# Patient Record
Sex: Male | Born: 1950
Health system: Southern US, Community
[De-identification: ages and names within clinical notes are randomized; demographics above are authoritative.]

## PROBLEM LIST (undated history)

## (undated) DIAGNOSIS — E46 Unspecified protein-calorie malnutrition: Secondary | ICD-10-CM

## (undated) DIAGNOSIS — Z87891 Personal history of nicotine dependence: Secondary | ICD-10-CM

## (undated) DIAGNOSIS — N182 Chronic kidney disease, stage 2 (mild): Secondary | ICD-10-CM

## (undated) DIAGNOSIS — E785 Hyperlipidemia, unspecified: Secondary | ICD-10-CM

## (undated) DIAGNOSIS — R000 Tachycardia, unspecified: Secondary | ICD-10-CM

## (undated) DIAGNOSIS — J189 Pneumonia, unspecified organism: Secondary | ICD-10-CM

## (undated) DIAGNOSIS — F32A Depression, unspecified: Secondary | ICD-10-CM

## (undated) DIAGNOSIS — I2510 Atherosclerotic heart disease of native coronary artery without angina pectoris: Secondary | ICD-10-CM

## (undated) DIAGNOSIS — I739 Peripheral vascular disease, unspecified: Secondary | ICD-10-CM

## (undated) DIAGNOSIS — E119 Type 2 diabetes mellitus without complications: Secondary | ICD-10-CM

## (undated) DIAGNOSIS — F1090 Alcohol use, unspecified, uncomplicated: Secondary | ICD-10-CM

## (undated) DIAGNOSIS — I6522 Occlusion and stenosis of left carotid artery: Secondary | ICD-10-CM

## (undated) DIAGNOSIS — I10 Essential (primary) hypertension: Secondary | ICD-10-CM

## (undated) DIAGNOSIS — E871 Hypo-osmolality and hyponatremia: Secondary | ICD-10-CM

## (undated) DIAGNOSIS — D649 Anemia, unspecified: Secondary | ICD-10-CM

## (undated) DIAGNOSIS — Z8489 Family history of other specified conditions: Secondary | ICD-10-CM

## (undated) DIAGNOSIS — R918 Other nonspecific abnormal finding of lung field: Secondary | ICD-10-CM

## (undated) DIAGNOSIS — F419 Anxiety disorder, unspecified: Secondary | ICD-10-CM

## (undated) DIAGNOSIS — G971 Other reaction to spinal and lumbar puncture: Secondary | ICD-10-CM

## (undated) DIAGNOSIS — J449 Chronic obstructive pulmonary disease, unspecified: Secondary | ICD-10-CM

## (undated) DIAGNOSIS — G4730 Sleep apnea, unspecified: Secondary | ICD-10-CM

## (undated) HISTORY — PX: OTHER SURGICAL HISTORY: SHX169

## (undated) HISTORY — PX: CAROTID STENT: SHX1301

## (undated) HISTORY — PX: LUNG BIOPSY: SHX232

---

## 1988-03-04 HISTORY — PX: OTHER SURGICAL HISTORY: SHX169

## 2004-04-09 ENCOUNTER — Ambulatory Visit: Payer: Self-pay

## 2008-06-03 ENCOUNTER — Emergency Department: Payer: Self-pay | Admitting: Emergency Medicine

## 2008-12-18 ENCOUNTER — Inpatient Hospital Stay: Payer: Self-pay | Admitting: Student

## 2009-01-13 ENCOUNTER — Ambulatory Visit: Payer: Self-pay | Admitting: Specialist

## 2009-02-28 ENCOUNTER — Ambulatory Visit: Payer: Self-pay | Admitting: Specialist

## 2009-04-11 ENCOUNTER — Ambulatory Visit: Payer: Self-pay | Admitting: Family Medicine

## 2009-05-04 ENCOUNTER — Ambulatory Visit: Payer: Self-pay | Admitting: Specialist

## 2009-11-13 ENCOUNTER — Ambulatory Visit: Payer: Self-pay | Admitting: Specialist

## 2010-10-05 DIAGNOSIS — B351 Tinea unguium: Secondary | ICD-10-CM | POA: Insufficient documentation

## 2010-10-05 DIAGNOSIS — E785 Hyperlipidemia, unspecified: Secondary | ICD-10-CM | POA: Insufficient documentation

## 2012-01-27 ENCOUNTER — Ambulatory Visit: Payer: Self-pay | Admitting: Specialist

## 2012-01-27 LAB — CREATININE, SERUM
EGFR (African American): >60
EGFR (Non-African Amer.): >60

## 2012-08-18 ENCOUNTER — Ambulatory Visit: Payer: Self-pay | Admitting: Specialist

## 2012-08-18 LAB — CREATININE, SERUM
Creatinine: 0.78 mg/dL (ref 0.60–1.30)
EGFR (African American): >60
EGFR (Non-African Amer.): >60

## 2013-03-30 ENCOUNTER — Ambulatory Visit: Payer: Self-pay | Admitting: Vascular Surgery

## 2013-04-13 ENCOUNTER — Inpatient Hospital Stay: Payer: Self-pay | Admitting: Vascular Surgery

## 2013-04-13 LAB — BASIC METABOLIC PANEL
ANION GAP: 3 — AB (ref 7–16)
BUN: 16 mg/dL (ref 7–18)
CALCIUM: 8.8 mg/dL (ref 8.5–10.1)
CHLORIDE: 101 mmol/L (ref 98–107)
CO2: 30 mmol/L (ref 21–32)
Creatinine: 0.77 mg/dL (ref 0.60–1.30)
EGFR (African American): >60
GLUCOSE: 135 mg/dL — AB (ref 65–99)
Osmolality: 271 (ref 275–301)
POTASSIUM: 4.3 mmol/L (ref 3.5–5.1)
SODIUM: 134 mmol/L — AB (ref 136–145)

## 2013-04-14 LAB — CBC WITH DIFFERENTIAL/PLATELET
BASOS ABS: 0.1 10*3/uL (ref 0.0–0.1)
Basophil %: 0.9 %
EOS ABS: 0.2 10*3/uL (ref 0.0–0.7)
EOS PCT: 2.3 %
HCT: 39.1 % — AB (ref 40.0–52.0)
HGB: 13.3 g/dL (ref 13.0–18.0)
Lymphocyte #: 2 10*3/uL (ref 1.0–3.6)
Lymphocyte %: 20 %
MCH: 29.9 pg (ref 26.0–34.0)
MCHC: 34 g/dL (ref 32.0–36.0)
MCV: 88 fL (ref 80–100)
MONO ABS: 1.3 x10 3/mm — AB (ref 0.2–1.0)
Monocyte %: 13.1 %
NEUTROS PCT: 63.7 %
Neutrophil #: 6.4 10*3/uL (ref 1.4–6.5)
Platelet: 271 10*3/uL (ref 150–440)
RBC: 4.45 10*6/uL (ref 4.40–5.90)
RDW: 14.3 % (ref 11.5–14.5)
WBC: 10.1 10*3/uL (ref 3.8–10.6)

## 2013-04-14 LAB — BASIC METABOLIC PANEL
ANION GAP: 4 — AB (ref 7–16)
BUN: 9 mg/dL (ref 7–18)
CREATININE: 0.62 mg/dL (ref 0.60–1.30)
Calcium, Total: 8.7 mg/dL (ref 8.5–10.1)
Chloride: 99 mmol/L (ref 98–107)
Co2: 28 mmol/L (ref 21–32)
EGFR (African American): >60
EGFR (Non-African Amer.): >60
Glucose: 122 mg/dL — ABNORMAL HIGH (ref 65–99)
Osmolality: 263 (ref 275–301)
POTASSIUM: 4 mmol/L (ref 3.5–5.1)
Sodium: 131 mmol/L — ABNORMAL LOW (ref 136–145)

## 2013-04-14 LAB — APTT: Activated PTT: 29.2 secs (ref 23.6–35.9)

## 2013-04-14 LAB — PROTIME-INR
INR: 1
PROTHROMBIN TIME: 13.1 s (ref 11.5–14.7)

## 2013-07-21 DIAGNOSIS — I6522 Occlusion and stenosis of left carotid artery: Secondary | ICD-10-CM | POA: Insufficient documentation

## 2013-12-07 ENCOUNTER — Ambulatory Visit: Payer: Self-pay | Admitting: Internal Medicine

## 2013-12-07 LAB — CBC CANCER CENTER
BASOS PCT: 1.1 %
Basophil #: 0.1 x10 3/mm (ref 0.0–0.1)
EOS ABS: 0.2 x10 3/mm (ref 0.0–0.7)
EOS PCT: 2.4 %
HCT: 42.1 % (ref 40.0–52.0)
HGB: 14.2 g/dL (ref 13.0–18.0)
LYMPHS PCT: 29.2 %
Lymphocyte #: 2.8 x10 3/mm (ref 1.0–3.6)
MCH: 29.7 pg (ref 26.0–34.0)
MCHC: 33.8 g/dL (ref 32.0–36.0)
MCV: 88 fL (ref 80–100)
Monocyte #: 0.9 x10 3/mm (ref 0.2–1.0)
Monocyte %: 8.9 %
Neutrophil #: 5.7 x10 3/mm (ref 1.4–6.5)
Neutrophil %: 58.4 %
PLATELETS: 340 x10 3/mm (ref 150–440)
RBC: 4.78 10*6/uL (ref 4.40–5.90)
RDW: 14 % (ref 11.5–14.5)
WBC: 9.7 x10 3/mm (ref 3.8–10.6)

## 2014-01-02 ENCOUNTER — Ambulatory Visit: Payer: Self-pay | Admitting: Internal Medicine

## 2014-03-03 DIAGNOSIS — H9311 Tinnitus, right ear: Secondary | ICD-10-CM | POA: Insufficient documentation

## 2014-04-30 ENCOUNTER — Inpatient Hospital Stay: Payer: Self-pay | Admitting: Internal Medicine

## 2014-06-25 NOTE — Op Note (Signed)
PATIENT NAME:  Bradley [name], Bradley [name] MR#:  [id number] DATE OF BIRTH:  13-Oct-1950  DATE OF PROCEDURE:  04/13/2013   PREOPERATIVE DIAGNOSIS:  Symptomatic left common carotid stenosis.   POSTOPERATIVE DIAGNOSIS:  Symptomatic left common carotid stenosis.  PROCEDURES PERFORMED:  1.  Arch aortogram.  2.  Selective injection, left common carotid for cervical views.  3.  Selective injection, left common carotid for cerebral views.  4.  Percutaneous transluminal angioplasty and stent placement, left common carotid using embolic protection.   SURGEON: Hortencia Pilar, M.D.   ASSISTANT:  Dr. Lucky Cowboy.  SEDATION: Versed 3 mg plus fentanyl 100 mcg administered IV. Continuous ECG, pulse oximetry and cardiopulmonary monitoring is performed throughout the entire procedure by the interventional radiology nurse. Total sedation time is 1 hour.   ACCESS: A 6-French sheath, right common femoral artery.   FLUOROSCOPY TIME: 5.5 minutes.   CONTRAST USED: Isovue 80 mL.   INDICATIONS: [name] is a 64 year old gentleman who is status post left carotid endarterectomy. Recently, he had several episodes of right arm dysesthesias, as well as some dizziness and syncope. Workup demonstrated a critical stenosis of the common carotid artery just proximal to the area of endarterectomy. Risks and benefits for angioplasty and stent placement were discussed. Alternative therapies were reviewed. All questions have been answered, and the patient has agreed to proceed with angioplasty and stent placement.   DESCRIPTION OF PROCEDURE: The patient is taken to special procedures and placed in the supine position. After adequate sedation is achieved, both groins are prepped and draped in a sterile fashion. Lidocaine 1% is infiltrated in the soft tissues overlying the femoral impulse. Ultrasound is placed in a sterile sleeve. Common femoral artery is identified. It is echolucent and pulsatile indicating patency. Image is recorded, and a  micropuncture needle is inserted into the anterior wall under direct ultrasound visualization; microwire followed by microsheath, J-wire followed by a 6-French sheath and a 5-French pigtail catheter. The pigtail catheter and J-wire are advanced into the ascending aorta and an LAO projection of the arch is obtained.   A stiff-angled Glidewire is used to exchange the pigtail catheter for a Bentson-Hanafee catheter, which is then used to engage the left common carotid. Wire is advanced and the catheter is subsequently  advanced to the mid common carotid.   Magnified view of the bifurcation is then obtained. The lesion is confirmed. It does appear to be fairly smooth, and I am suspicious that it represents possibly a clamp injury from the endarterectomy. He has bovine anatomy with a type III arch, and therefore, it is my opinion that the manner in which we approach this is the same as is usually seen in that the wire catheter combination will be negotiated into the external, which is done. Hand injection of contrast used to confirm catheter tip position and subsequently an Amplatz Super Stiff wire is advanced through the catheter. The catheter is removed, and a 90 cm Shuttle sheath is advanced up and over the wire and positioned with its tip just 2 cm or so below the lesion.   A large Emboshield is then advanced into the internal and deployed without difficulty. At the time of accessing the patient, 6000 units of heparin was given; ACT was 265, and another 2000 units of heparin was given thereafter.   A 10 x 30 straight Xact stent is then advanced across the lesion. It is deployed without difficulty, and then postdilated with a 7 x 30 balloon. Inflation is to 12 atmospheres very  briefly, and the balloon is then taken down. Followup imaging demonstrates the stent appears to be well approximated and quite nicely matched to the vessel size. One AP injection is obtained, and subsequently the filter was retrieved  without incident. Bilateral oblique views were then obtained of the cervical carotid and lateral and Waters views of the intracranial circulation are obtained. These are compared with the pre-stent images and are found to be identical.   The sheath is then pulled back into the external on the right. Oblique view was obtained and a StarClose device deployed without difficulty. There are no immediate complications. The patient remained neurologically intact throughout the entire procedure without any changes.   INTERPRETATION: Initial images demonstrate the patient has a type III arch with bovine anatomy. There is a moderate calcification at the common origin of the innominate and left carotid. This does not appear to be flow limiting.   The common carotid is otherwise patent and measures approximately 7 to 8 mm up to the distal aspect where a 20 to 25 mm tapered stenosis is noted. This appears smooth. It is approximately 80% to 85% diameter reduction. Just above this, is the carotid bulb which clearly demonstrates changes consistent with prior endarterectomy. Both external and internal are otherwise widely patent. The siphon of the internal carotid and the distal internal carotid artery is patent, and the middle cerebral and anterior cerebral fills quite nicely.   Following angioplasty and stent placement, using a 10 x 30 stent with a 7 mm post dilatation, there is an excellent result with minimal residual stenosis.   SUMMARY: Successful angioplasty and stent placement, left common carotid artery as described above.    ____________________________ Katha Cabal, MD ggs:dmm D: 04/13/2013 14:54:14 ET T: 04/13/2013 19:29:45 ET JOB#: [id number]  cc: Katha Cabal, MD, <Dictator> Katha Cabal MD ELECTRONICALLY SIGNED 04/15/2013 17:17

## 2014-07-03 NOTE — H&P (Signed)
PATIENT NAME:  Bradley [name], Bradley [name] MR#:  [id number] DATE OF BIRTH:  Apr 04, 1950  DATE OF ADMISSION:  04/30/2014  PRIMARY CARE PROVIDER: Valera Castle, MD   EMERGENCY DEPARTMENT REFERRING PHYSICIAN: Baird Cancer. Quale, MD  CHIEF COMPLAINT: Shortness of breath, cough, tachycardia.   HISTORY OF PRESENT ILLNESS: The patient is a 64 year old, white male with history of  COPD, history of having elevated heart rate in the past, hyperlipidemia, previous history of pneumonia, borderline diabetes according to him, borderline hyperlipidemia, has had right upper lobe partially removed for tumor, presents with shortness of breath ongoing for the past 2 days. The patient reports that he has been having shortness of breath with activity and rest for the past 2 days. He is not on O2 at home. He is on 3 liters of oxygen currently. He also has had whitish phlegm. Has not had any fevers or chills. He has been having wheezing and cold. The patient also stated that "he has been feeling jittery." His heart rate in the ER has also been noted to be elevated in the 120s sinus tachycardia. He also states that he has been feeling dizzy with walking. He otherwise denies any of abdominal pain, but does have some vomiting that he had x 2 today. No diarrhea.   PAST MEDICAL HISTORY: 1. Hypertension.  2. History of COPD.  3. Hyperlipidemia which is borderline.  4. History of pneumonia in the past.  5. History of elevated heart rate.  6. History of diabetes, which he describes it as borderline.   PAST SURGICAL HISTORY: Right upper lobe lung resection and left carotid endarterectomy.   ALLERGIES: None.   SOCIAL HISTORY: History of smoking, quit a long time ago. No alcohol or drug use.   FAMILY HISTORY: Positive for hypertension.  MEDICATIONS AT HOME: 1. Spiriva 18 mcg daily.  2. Protonix 40 daily.  3. Norco 325/7.5 every 6 p.r.n. 4. Nitroglycerin 0.4 sublingual p.r.n. 5. Metoprolol tartrate 50 mg 1 tablet p.o.  b.i.d. 6. Metformin 500 mg 1 tablet p.o. b.i.d. 7. Albuterol ipratropium 1 puff 4 times a day as needed.  8. Advair 250/50, 1 puff b.i.d.   REVIEW OF SYSTEMS:  CONSTITUTIONAL: Denies any fevers. Complains of some fatigue, weakness. No pain. No weight loss or weight gain.  EYES: No blurred or double vision. No redness or inflammation.  ENT: No tinnitus. No ear pain. No hearing loss. No seasonal or year-round allergies. No difficulty swallowing.  RESPIRATORY: Complains of cough, wheezing, COPD. No hemoptysis. No TB. Complains of dyspnea. No painful respiration.  CARDIOVASCULAR: Denies any chest pain, orthopnea, edema. Complains of sinus tachycardia and dizziness.  GASTROINTESTINAL: Complains of nausea, vomiting, but no diarrhea. No abdominal pain. No hematemesis. No hematochezia.  GENITOURINARY: Denies any frequency, urgency or hesitancy.  ENDOCRINE: Denies any polyuria or nocturia.  HEMATOLOGIC AND LYMPHATIC: Denies anemia, easy bruisability or bleeding.  SKIN: No rash.  LYMPH NODES: Denies any lymph node enlargement.  MUSCULOSKELETAL: Has chronic back pain.  NEUROLOGIC: No CVA, TIA or seizures.  PSYCHIATRIC: No anxiety, insomnia or ADD.   PHYSICAL EXAMINATION: VITAL SIGNS: Temperature 98.8, pulse 121, respirations 30, blood pressure 117/69, O2 at 97%.  GENERAL: The patient is a thin, Caucasian male in no acute distress.  HEENT: Head atraumatic, normocephalic. Pupils equally round, reactive to light and accommodation. There is no conjunctival pallor. No sclerae icterus. Nasal exam shows no drainage or ulceration. Oropharynx is clear without any exudate.  NECK: Supple without any thyromegaly.  CARDIOVASCULAR: Tachycardic. No murmurs, rubs,  clicks or gallops.  LUNGS: He has some muscle usage with bilateral wheezing.  ABDOMEN: Soft, nontender, nondistended. Positive bowel sounds x 4.  EXTREMITIES: No clubbing, cyanosis or edema.  SKIN: No rash.  LYMPH NODES: Nonpalpable.  MUSCULOSKELETAL:  There is no erythema or swelling.  VASCULAR: Good DP, PT pulses.  PSYCHIATRIC: Not anxious or depressed. NEUROLOGIC:  Awake, alert, oriented x 3. No focal deficits.   DIAGNOSTIC DATA: Chest x-ray shows chronic changes of the lungs.  LABORATORY DATA: Admitting WBC count 15.2, hemoglobin 13.6, platelet count 290,000. Troponin less than 0.02. WBC 139, BUN 15, creatinine 0.73, sodium 138, potassium 3.9, chloride 104, CO2 of 25. BNP is 124.   ASSESSMENT AND PLAN: The patient is a 64 year old with history of chronic obstructive pulmonary disease with shortness of breath and cough.  1. Acute respiratory failure due to acute on chronic obstructive pulmonary disease flare. At this time, we will treat him with Xopenex nebulizers due to elevated heart rate, IV Solu-Medrol, IV antibiotics for presumably acute bronchitis. We will also add Mucinex to his current regimen. We will continue Advair and Spiriva as taking at home.  2. Sinus tachycardia. The patient has been symptomatic with this. We will monitor him on telemetry. Use p.r.n., IV Cardizem. He is already on metoprolol, which I will continue.  3. Diabetes. We will place him on sliding scale insulin. Continue metformin.  4. Gastroesophageal reflux disease. Continue Protonix as taking at home.  5. Miscellaneous. The patient will be on Lovenox for deep vein thrombosis prophylaxis.   TIME SPENT: 55 minutes.    ____________________________ Lafonda Mosses Posey Pronto, MD shp:TT D: 04/30/2014 12:44:38 ET T: 04/30/2014 12:57:04 ET JOB#: [id number]  cc: Marisue Canion H. Posey Pronto, MD, <Dictator> Alric Seton MD ELECTRONICALLY SIGNED 05/06/2014 15:22

## 2014-07-03 NOTE — Consult Note (Signed)
Brief Consult Note: Diagnosis: E coli bacteremia, recurrent abd pain, leukocytosis.   Patient was seen by consultant.   Consult note dictated.   Recommend to proceed with surgery or procedure.   Recommend further assessment or treatment.   Orders entered.   Discussed with Attending MD.   Comments: Check CT abd pelvis Check LFTs - I added on to labs Change abx to cipro flagyl for now WIll follow.  Electronic Signatures: Angelena Form (MD)  (Signed 01-Mar-16 16:35)  Authored: Brief Consult Note   Last Updated: 01-Mar-16 16:35 by Angelena Form (MD)

## 2014-07-03 NOTE — Discharge Summary (Signed)
PATIENT NAME:  Bradley [name], Bradley [name] MR#:  [id number] DATE OF BIRTH:  11/08/1950  DATE OF ADMISSION:  04/30/2014 DATE OF DISCHARGE:  05/05/2014  PRIMARY CARE PHYSICIAN: Valera Castle, MD   DISCHARGE DIAGNOSES:  1.  Acute respiratory failure with hypoxia due to chronic obstructive pulmonary disease exacerbation. 2.  Sepsis with Escherichia coli bacteremia.  3.  Hypertension.  4.  Diabetes.   CONDITION: Stable.   CODE STATUS: Full code.   HOME MEDICATIONS: Please refer to the medication reconciliation list.   DIET: Low-sodium, low-fat, low-cholesterol, ADA diet.   ACTIVITY: As tolerated.    FOLLOWUP: With PCP within 1 to 2 weeks.   REASON FOR ADMISSION: Shortness of breath, cough, and tachycardia.   HOSPITAL COURSE: The patient is a 64 year old Caucasian male with a history of COPD, hypertension, diabetes, came to the ED due to shortness of breath, cough, and tachycardia for 2 days. For detailed history and physical examination please refer to the admission note dictated by Dr. Dustin Flock. Laboratory data on admission date showed WBC 16.2, hemoglobin 13.6. Troponin was less than 0.02. Electrolytes were normal.  1.  Acute respiratory failure due to COPD exacerbation. After admission, the patient has been treated with oxygen by nasal cannula, Xopenex IV, Solu-Medrol with antibiotics. The patient's symptoms have much improved off oxygen. 2.  Sepsis with bacteremia Escherichia coli. The patient's blood culture showed Escherichia coli positive. Dr. Ola Spurr suggests unclear etiology and either Keflex for a total of 10 days from blood culture date. The patient has no symptoms. Vital signs are stable. He is clinically stable. We will be discharged to home today. Discussed the patient's discharge plan with the patient, nurse, and the case manager.  TIME SPENT: About 36 minutes.   ____________________________ Demetrios Loll, MD qc:TM D: 05/05/2014 17:18:03 ET T: 05/06/2014 01:20:30  ET JOB#: [id number]  cc: Demetrios Loll, MD, <Dictator> Demetrios Loll MD ELECTRONICALLY SIGNED 05/06/2014 17:13

## 2014-07-03 NOTE — Consult Note (Signed)
PATIENT NAME:  Bradley [name], Bradley [name] MR#:  [id number] DATE OF BIRTH:  May 17, 1950  DATE OF CONSULTATION:  05/03/2014  REQUESTING PHYSICIAN:  Belia Heman. Verdell Carmine, MD CONSULTING PHYSICIAN:  Cheral Marker. Ola Spurr, MD  REASON FOR CONSULTATION:  Gram-negative Escherichia coli bacteremia without a source.   HISTORY OF PRESENT ILLNESS: This is a very pleasant 64 year old gentleman with history of severe COPD. He was admitted February 27 with shortness of breath, cough, and tachycardia. The patient reports that he had eaten some barbecue Friday night. He then developed some abdominal upset and nausea, and vomited a few times. He did not have any diarrhea. He then started to have some increasing difficulty breathing and shortness of breath. He also reported some cough with phlegm and some wheezing. He was seen in the ED and noted to have tachycardia. Since admission, he has been treated for COPD exacerbation. However, blood cultures returned positive 2/2 for Escherichia coli. We are consulted for further antibiotic management and possible evaluation of source of Escherichia coli bacteremia.   PAST MEDICAL HISTORY: 1.  Hypertension.  2.  COPD.  3.  Hyperlipidemia.  4.  History of prior pneumonia.  5.  History of tachycardia.  6.  History of diabetes, borderline.   PAST SURGICAL HISTORY: Right upper lobe lung resection and left carotid endarterectomy.   SOCIAL HISTORY: Quit smoking a long time ago. No alcohol or drug use.   ALLERGIES: None.   FAMILY HISTORY: Noncontributory.   REVIEW OF SYSTEMS: Eleven systems were reviewed and negative except as per HPI.   ANTIBIOTICS SINCE ADMISSION: Include levofloxacin begun February 26 and Zosyn begun February 27. He is also on methylprednisolone.   PHYSICAL EXAMINATION: VITAL SIGNS: Temperature 99.7, pulse 89, blood pressure 149/78, respirations 18, saturation 92% on room air. He has been afebrile since admission.  HEENT: Pupils reactive. Sclerae anicteric. Oropharynx is  clear with no thrush. NECK: Supple.  HEART: Regular.  LUNGS: Have decreased breath sounds bilaterally with poor air movement, but no rhonchi or crackles.  ABDOMEN: Soft, mildly tender to palpation in right upper quadrant and in the epigastrium.  EXTREMITIES: No clubbing, cyanosis, or edema.  SKIN: No rash or lesions.  NEUROLOGIC: He is alert and oriented x 3. Grossly nonfocal neurologic exam.   DATA: White blood count on February 27 was 15.2, elevated to 25.7 on February 28 and down to 17.7 on March 1. Hemoglobin 13.1, platelets 302,000. Blood cultures 2/2 growing gram-negative rods with 1 culture identified as Escherichia coli, sensitive to cefazolin, ceftriaxone, Cipro, gentamicin, ceftazidime, imipenem, and levofloxacin. Urinalysis done February 29 showed no white cells. Influenza A and B testing was negative. Renal function is normal. Creatinine 0.67. Hemoglobin A1c 6.8. LFTs added on today are normal except albumin low at 2.9.  IMAGING: Chest x-ray, February 27, showed chronic changes without any acute abnormalities. CT of the abdomen and pelvis done today with IV contrast showed old granulomatous disease in the right hepatic dome as well as in the spleen. There is severe emphysema at the bases of the lungs. Gallbladder, renal system, and colon are normal except for moderate stool burden. There are renal cysts.   IMPRESSION: A 64 year old gentleman with history of chronic obstructive pulmonary disease, admitted with 1 episode of abdominal upset in the epigastric area, followed by vomiting, which seemed to precipitate a chronic obstructive pulmonary disease exacerbation. On admission, he had an elevated white count and was tachycardic. Blood cultures 2/2 are growing Escherichia coli. Chest x-ray does not show any obvious infiltrate. CT  scan of the abdomen and pelvis done today does not show any source in his biliary or colonic system. He reports having a colonoscopy approximately 2 years ago, which  he was told was normal. He reports also having an EGD done in the past. He denies having issues with weight loss over the last several months or other chronic decline that would suggest a malignancy.   RECOMMENDATIONS: 1.  Without an obvious source for the Escherichia coli bacteremia, I would suggest a 14 day total course of antibiotics.  At this point we can narrow it to Keflex 500 mg q. 8 hours for a total of 10 days from the date of the blood culture.  He may warrant a colonoscopy and EGD in the future, although he has had a colonoscopy relatively recently and the CT scan does not show any colonic issues except moderate stool burden.  2.  Might suggest laxative to help resolve his constipation noted on CT.   Thank you for the consult. I will be glad to see the patient in followup in 2-3 weeks if needed.     ____________________________ Cheral Marker. Ola Spurr, MD dpf:LT D: 05/03/2014 20:56:00 ET T: 05/03/2014 21:35:03 ET JOB#: [id number]  cc: Cheral Marker. Ola Spurr, MD, <Dictator> Lynisha Osuch Ola Spurr MD ELECTRONICALLY SIGNED 05/05/2014 20:01

## 2014-07-04 ENCOUNTER — Ambulatory Visit: Payer: Self-pay | Admitting: Internal Medicine

## 2014-07-14 ENCOUNTER — Emergency Department
Admission: EM | Admit: 2014-07-14 | Discharge: 2014-07-14 | Disposition: A | Discharge status: Home / Self Care | Payer: No Typology Code available for payment source | Attending: Emergency Medicine | Admitting: Emergency Medicine

## 2014-07-14 ENCOUNTER — Emergency Department: Payer: No Typology Code available for payment source

## 2014-07-14 ENCOUNTER — Encounter: Payer: Self-pay | Admitting: Emergency Medicine

## 2014-07-14 ENCOUNTER — Ambulatory Visit (INDEPENDENT_AMBULATORY_CARE_PROVIDER_SITE_OTHER)
Admission: EM | Admit: 2014-07-14 | Discharge: 2014-07-14 | Disposition: A | Discharge status: Other Acute Inpatient Hospital | Payer: No Typology Code available for payment source | Source: Home / Self Care | Attending: Family Medicine | Admitting: Family Medicine

## 2014-07-14 ENCOUNTER — Other Ambulatory Visit: Payer: Self-pay

## 2014-07-14 DIAGNOSIS — R0602 Shortness of breath: Secondary | ICD-10-CM

## 2014-07-14 DIAGNOSIS — R531 Weakness: Secondary | ICD-10-CM | POA: Diagnosis not present

## 2014-07-14 DIAGNOSIS — J441 Chronic obstructive pulmonary disease with (acute) exacerbation: Secondary | ICD-10-CM | POA: Diagnosis not present

## 2014-07-14 DIAGNOSIS — J449 Chronic obstructive pulmonary disease, unspecified: Secondary | ICD-10-CM

## 2014-07-14 DIAGNOSIS — I10 Essential (primary) hypertension: Secondary | ICD-10-CM | POA: Insufficient documentation

## 2014-07-14 DIAGNOSIS — I2510 Atherosclerotic heart disease of native coronary artery without angina pectoris: Secondary | ICD-10-CM | POA: Insufficient documentation

## 2014-07-14 DIAGNOSIS — Z87891 Personal history of nicotine dependence: Secondary | ICD-10-CM | POA: Insufficient documentation

## 2014-07-14 DIAGNOSIS — E119 Type 2 diabetes mellitus without complications: Secondary | ICD-10-CM | POA: Diagnosis not present

## 2014-07-14 DIAGNOSIS — Z7951 Long term (current) use of inhaled steroids: Secondary | ICD-10-CM | POA: Diagnosis not present

## 2014-07-14 DIAGNOSIS — R9431 Abnormal electrocardiogram [ECG] [EKG]: Secondary | ICD-10-CM

## 2014-07-14 DIAGNOSIS — Z79899 Other long term (current) drug therapy: Secondary | ICD-10-CM | POA: Insufficient documentation

## 2014-07-14 DIAGNOSIS — R0600 Dyspnea, unspecified: Secondary | ICD-10-CM | POA: Diagnosis present

## 2014-07-14 HISTORY — DX: Atherosclerotic heart disease of native coronary artery without angina pectoris: I25.10

## 2014-07-14 HISTORY — DX: Chronic obstructive pulmonary disease, unspecified: J44.9

## 2014-07-14 HISTORY — DX: Type 2 diabetes mellitus without complications: E11.9

## 2014-07-14 HISTORY — DX: Essential (primary) hypertension: I10

## 2014-07-14 LAB — CBC WITH DIFFERENTIAL/PLATELET
Basophils Absolute: 0.1 10*3/uL (ref 0–0.1)
Basophils Relative: 1 %
EOS ABS: 0.1 10*3/uL (ref 0–0.7)
EOS PCT: 1 %
HEMATOCRIT: 41.3 % (ref 40.0–52.0)
HEMOGLOBIN: 13.8 g/dL (ref 13.0–18.0)
Lymphocytes Relative: 18 %
Lymphs Abs: 2.1 10*3/uL (ref 1.0–3.6)
MCH: 28.2 pg (ref 26.0–34.0)
MCHC: 33.5 g/dL (ref 32.0–36.0)
MCV: 84.3 fL (ref 80.0–100.0)
MONO ABS: 1.1 10*3/uL — AB (ref 0.2–1.0)
Monocytes Relative: 10 %
Neutro Abs: 8.1 10*3/uL — ABNORMAL HIGH (ref 1.4–6.5)
Neutrophils Relative %: 70 %
Platelets: 316 10*3/uL (ref 150–440)
RBC: 4.9 MIL/uL (ref 4.40–5.90)
RDW: 14.6 % — ABNORMAL HIGH (ref 11.5–14.5)
WBC: 11.6 10*3/uL — AB (ref 3.8–10.6)

## 2014-07-14 LAB — COMPREHENSIVE METABOLIC PANEL
ALT: 19 U/L (ref 17–63)
ANION GAP: 8 (ref 5–15)
AST: 18 U/L (ref 15–41)
Albumin: 3.7 g/dL (ref 3.5–5.0)
Alkaline Phosphatase: 66 U/L (ref 38–126)
BUN: 13 mg/dL (ref 6–20)
CHLORIDE: 99 mmol/L — AB (ref 101–111)
CO2: 24 mmol/L (ref 22–32)
Calcium: 9.1 mg/dL (ref 8.9–10.3)
Creatinine, Ser: 0.65 mg/dL (ref 0.61–1.24)
GFR calc non Af Amer: >60 mL/min (ref >60)
GLUCOSE: 130 mg/dL — AB (ref 65–99)
Potassium: 4.1 mmol/L (ref 3.5–5.1)
Sodium: 131 mmol/L — ABNORMAL LOW (ref 135–145)
Total Bilirubin: 0.8 mg/dL (ref 0.3–1.2)
Total Protein: 6.9 g/dL (ref 6.5–8.1)

## 2014-07-14 LAB — FIBRIN DERIVATIVES D-DIMER (ARMC ONLY): Fibrin derivatives D-dimer (ARMC): 421 (ref 0–499)

## 2014-07-14 LAB — TROPONIN I: Troponin I: <0.03 ng/mL (ref <0.031)

## 2014-07-14 MED ORDER — PREDNISONE 20 MG PO TABS
60.0000 mg | ORAL_TABLET | ORAL | Status: AC
  Administered 2014-07-14: 60 mg via ORAL

## 2014-07-14 MED ORDER — PREDNISONE 20 MG PO TABS: 60.0000 mg | ORAL_TABLET | Freq: Every day | ORAL | Status: DC

## 2014-07-14 MED ORDER — PREDNISONE 20 MG PO TABS
ORAL_TABLET | ORAL | Status: AC
  Administered 2014-07-14: 60 mg via ORAL
  Filled 2014-07-14: qty 3

## 2014-07-14 MED ORDER — IPRATROPIUM-ALBUTEROL 0.5-2.5 (3) MG/3ML IN SOLN
3.0000 mL | Freq: Once | RESPIRATORY_TRACT | Status: AC
  Administered 2014-07-14: 3 mL via RESPIRATORY_TRACT
  Filled 2014-07-14: qty 3

## 2014-07-14 MED ORDER — IPRATROPIUM-ALBUTEROL 0.5-2.5 (3) MG/3ML IN SOLN
3.0000 mL | Freq: Once | RESPIRATORY_TRACT | Status: AC
  Administered 2014-07-14: 3 mL via RESPIRATORY_TRACT

## 2014-07-14 MED ORDER — SODIUM CHLORIDE 0.9 % IV SOLN: Freq: Once | INTRAVENOUS | Status: DC

## 2014-07-14 MED ORDER — IPRATROPIUM-ALBUTEROL 0.5-2.5 (3) MG/3ML IN SOLN
RESPIRATORY_TRACT | Status: AC
  Administered 2014-07-14: 3 mL via RESPIRATORY_TRACT
  Filled 2014-07-14: qty 3

## 2014-07-14 NOTE — ED Notes (Signed)
Patient transported to X-ray 

## 2014-07-14 NOTE — ED Notes (Signed)
Sent in from [facility] with SOB and weakness

## 2014-07-14 NOTE — ED Notes (Signed)
Started 2 days ago with SOB, weakness and 'tired". Worse this morning. Hx of High WBC-to be seen in 2 weeks with Oncologist (Dr. Cynda Acres)

## 2014-07-14 NOTE — Discharge Instructions (Signed)
We believe that your symptoms are caused today by an exacerbation of your COPD.  Please take the prescribed medications and any medications that you have at home for your COPD.  Follow up with your doctor as recommended.  If you develop any new or worsening symptoms, including but not limited to fever, chest pain, persistent vomiting, worsening shortness of breath, or other symptoms that concern you, please return to the Emergency Department immediately.

## 2014-07-14 NOTE — ED Provider Notes (Signed)
CSN: [id number]     Arrival date & time 07/14/14  0802 History   First MD Initiated Contact with Patient 07/14/14 0805     Chief Complaint  Patient presents with  . Shortness of Breath  . Weakness  . Anxiety   (Consider location/radiation/quality/duration/timing/severity/associated sxs/prior Treatment) HPI       64 year old male with history of hypertension, diabetes, COPD, recent history of admission for Escherichia coli bacteremia 3 months ago, presents complaining of weakness and shortness of breath. This started 2 days ago. Symptoms have been gradually worsening. Symptoms are worse with exertion. He denies fever, chills, NVD. No recent travel. He is also currently in the process of being evaluated for persistent leukocytosis, he is seeing hematology oncology for this. He does not know if he has any hematologic malignancy. He does not know how high his white blood cell count has been. He has no history of coronary artery disease. Is not smoking currently, he quit recently. He has had some leg swelling, worse on the right than the left. It has gotten better in the past week however.   Past Medical History  Diagnosis Date  . COPD (chronic obstructive pulmonary disease)   . Diabetes mellitus without complication   . Coronary artery disease   . Hypertension    Past Surgical History  Procedure Laterality Date  . Lung biopsy Right     2006   Family History  Problem Relation Age of Onset  . Cancer Mother   . Heart attack Father    History  Substance Use Topics  . Smoking status: Former Research scientist (life sciences)  . Smokeless tobacco: Not on file  . Alcohol Use: No    Review of Systems  Constitutional: Positive for fatigue. Negative for fever and chills.  Eyes: Negative for visual disturbance.  Respiratory: Positive for shortness of breath. Negative for wheezing.   Cardiovascular: Negative for chest pain.  Gastrointestinal: Negative for nausea, vomiting, abdominal pain and diarrhea.  Endocrine:  Negative for polydipsia and polyuria.  Skin: Negative for rash.  Neurological: Positive for weakness.  All other systems reviewed and are negative.   Allergies  Review of patient's allergies indicates no known allergies.  Home Medications   Prior to Admission medications   Medication Sig Start Date End Date Taking? Authorizing Provider  albuterol-ipratropium (COMBIVENT) 18-103 MCG/ACT inhaler Inhale into the lungs every 4 (four) hours.   Yes Historical Provider, MD  Fluticasone-Salmeterol (ADVAIR) 250-50 MCG/DOSE AEPB Inhale 1 puff into the lungs 2 (two) times daily.   Yes Historical Provider, MD  metFORMIN (GLUCOPHAGE) 500 MG tablet Take by mouth 2 (two) times daily with a meal.   Yes Historical Provider, MD  metoprolol (LOPRESSOR) 50 MG tablet Take 50 mg by mouth 2 (two) times daily.   Yes Historical Provider, MD  pantoprazole (PROTONIX) 40 MG tablet Take 40 mg by mouth daily.   Yes Historical Provider, MD  simvastatin (ZOCOR) 20 MG tablet Take 20 mg by mouth daily.   Yes Historical Provider, MD  tiotropium (SPIRIVA) 18 MCG inhalation capsule Place 18 mcg into inhaler and inhale daily.   Yes Historical Provider, MD  nitroGLYCERIN (NITROSTAT) 0.4 MG SL tablet Place 0.4 mg under the tongue every 5 (five) minutes as needed for chest pain.    Historical Provider, MD   BP 142/78 mmHg  Pulse 80  Temp(Src) 95.8 F (35.4 C) (Tympanic)  Resp 18  Ht '5\' 7"'$  (1.702 m)  Wt 150 lb (68.04 kg)  BMI 23.49 kg/m2  SpO2 95%  Physical Exam  Constitutional: He is oriented to person, place, and time. He appears well-developed and well-nourished. No distress.  Thin habitus  HENT:  Head: Normocephalic.  Neck: Normal range of motion. Neck supple. No JVD present. No tracheal deviation present.  Cardiovascular: Normal rate, regular rhythm, normal heart sounds and intact distal pulses.   No peripheral edema  Pulmonary/Chest: Effort normal. No respiratory distress. He has wheezes (scattered, faint,  expiratory). He has no rhonchi. He has no rales.  Abdominal: Soft. He exhibits no distension.  Neurological: He is alert and oriented to person, place, and time. Coordination normal.  Skin: Skin is warm and dry. No rash noted. He is not diaphoretic.  Psychiatric: He has a normal mood and affect. Judgment normal.  Nursing note and vitals reviewed.   ED Course  ED EKG  Date/Time: 07/14/2014 9:10 AM Performed by: Liam Graham Authorized by: Allena Katz H Comparison: compared with previous ECG  Rhythm: sinus rhythm Rate: normal QRS axis: normal Conduction: right bundle branch block ST Elevation: V4 and V5 T Waves: T waves normal Clinical impression: abnormal ECG Comments: EKG independently reviewed by me as above, he has ST elevations in V4 and V5. These are new as compared to previous EKG.   (including critical care time) Labs Review Labs Reviewed  CBC WITH DIFFERENTIAL/PLATELET - Abnormal; Notable for the following:    WBC 11.6 (*)    RDW 14.6 (*)    Neutro Abs 8.1 (*)    Monocytes Absolute 1.1 (*)    All other components within normal limits  COMPREHENSIVE METABOLIC PANEL - Abnormal; Notable for the following:    Sodium 131 (*)    Chloride 99 (*)    Glucose, Bld 130 (*)    All other components within normal limits  TROPONIN I  FIBRIN DERIVATIVES D-DIMER [facility])    Imaging Review No results found.   MDM   1. Weakness   2. SOB (shortness of breath)   3. Abnormal EKG      on further review of the EKG, he has 1 mm ST elevations in V4 and V5. He'll be transferred to the emergency department for further evaluation       Liam Graham, PA-C 07/14/14 1013

## 2014-07-14 NOTE — ED Provider Notes (Signed)
[facility] Emergency Department Provider Note  ____________________________________________  Time seen: Approximately 2:27 PM  I have reviewed the triage vital signs and the nursing notes.   HISTORY  Chief Complaint Respiratory Distress    HPI Bradley [name] is a 64 y.o. male who presents from the urgent care after going there this morning to be evaluated for about 2 days of increased respiratory difficulties.  He has a history of mild COPD and does not use oxygen at home.  He stopped smoking about 15 years ago.  He does, however, see Dr. Raul Del with pulmonology.  He reports increased generalized weakness and shortness of breath particularly with exertion over the last 2 days.  He denies chest pain but states that when it is hard to breathe he also feels chest pressure.  Resting makes it better and exertion makes it worse.  The symptoms are moderate at most and mild currently.  He was sent over from the urgent care due to what they interpreted as 1 mm of ST elevation in leads V4 and V5 which was new from a prior EKG.  He denies ever having a heart attack or other cardiac issues.  The patient is in no acute distress and comfortable while laying in bed in the exam room in the ED.  He denies nausea, vomiting, urinary symptoms, abdominal pain, and diarrhea/constipation.   Past Medical History  Diagnosis Date  . COPD (chronic obstructive pulmonary disease)   . Diabetes mellitus without complication   . Coronary artery disease   . Hypertension     There are no active problems to display for this patient.   Past Surgical History  Procedure Laterality Date  . Lung biopsy Right     2006    Current Outpatient Rx  Name  Route  Sig  Dispense  Refill  . albuterol-ipratropium (COMBIVENT) 18-103 MCG/ACT inhaler   Inhalation   Inhale into the lungs every 4 (four) hours.         . Fluticasone-Salmeterol (ADVAIR) 250-50 MCG/DOSE AEPB   Inhalation   Inhale 1 puff  into the lungs 2 (two) times daily.         . metFORMIN (GLUCOPHAGE) 500 MG tablet   Oral   Take by mouth 2 (two) times daily with a meal.         . metoprolol (LOPRESSOR) 50 MG tablet   Oral   Take 50 mg by mouth 2 (two) times daily.         . nitroGLYCERIN (NITROSTAT) 0.4 MG SL tablet   Sublingual   Place 0.4 mg under the tongue every 5 (five) minutes as needed for chest pain.         . pantoprazole (PROTONIX) 40 MG tablet   Oral   Take 40 mg by mouth daily.         . predniSONE (DELTASONE) 20 MG tablet   Oral   Take 3 tablets (60 mg total) by mouth daily with breakfast.   15 tablet   0   . simvastatin (ZOCOR) 20 MG tablet   Oral   Take 20 mg by mouth daily.         Marland Kitchen tiotropium (SPIRIVA) 18 MCG inhalation capsule   Inhalation   Place 18 mcg into inhaler and inhale daily.           Allergies Review of patient's allergies indicates no known allergies.  Family History  Problem Relation Age of Onset  . Cancer Mother   .  Heart attack Father     Social History History  Substance Use Topics  . Smoking status: Former Research scientist (life sciences)  . Smokeless tobacco: Not on file  . Alcohol Use: No    Review of Systems Constitutional: No fever/chills Eyes: No visual changes. ENT: No sore throat. Cardiovascular: Denies chest pain but endorses chest "pressure" when he is short of breath Respiratory: Mild to moderate shortness of breath worse with exertion for about 2 days Gastrointestinal: No abdominal pain.  No nausea, no vomiting.  No diarrhea.  No constipation. Genitourinary: Negative for dysuria. Musculoskeletal: Negative for back pain. Skin: Negative for rash. Neurological: Negative for headaches, focal weakness or numbness.  10-point ROS otherwise negative.  ____________________________________________   PHYSICAL EXAM:  VITAL SIGNS: ED Triage Vitals  Enc Vitals Group     BP 07/14/14 1048 121/72 mmHg     Pulse Rate 07/14/14 1048 77     Resp 07/14/14  1048 22     Temp 07/14/14 1048 98.1 F (36.7 C)     Temp Source 07/14/14 1048 Oral     SpO2 07/14/14 1047 97 %     Weight 07/14/14 1048 150 lb (68.04 kg)     Height 07/14/14 1048 5' 7"  (1.702 m)     Head Cir --      Peak Flow --      Pain Score 07/14/14 1344 0     Pain Loc --      Pain Edu? --      Excl. in [place]? --     Constitutional: Alert and oriented. Well appearing and in no acute distress. Eyes: Conjunctivae are normal. PERRL. EOMI. Head: Atraumatic. Nose: No congestion/rhinnorhea. Mouth/Throat: Mucous membranes are moist.  Oropharynx non-erythematous. Neck: No stridor.   Cardiovascular: Normal rate, regular rhythm. Grossly normal heart sounds.  Good peripheral circulation. Respiratory: Normal respiratory effort.  No retractions.  Mild wheezing throughout lung fields, more noticeable in left upper lobe. Gastrointestinal: Soft and nontender. No distention. No abdominal bruits. No CVA tenderness. Musculoskeletal: No lower extremity tenderness nor edema.  No joint effusions. Neurologic:  Normal speech and language. No gross focal neurologic deficits are appreciated. Speech is normal. No gait instability. Skin:  Skin is warm, dry and intact. No rash noted. Psychiatric: Mood and affect are normal. Speech and behavior are normal.  ____________________________________________   LABS (all labs ordered are listed, but only abnormal results are displayed)  Recent Results (from the past 2160 hour(s))  CBC with Differential     Status: Abnormal   Collection Time: 07/14/14  9:03 AM  Result Value Ref Range   WBC 11.6 (H) 3.8 - 10.6 K/uL   RBC 4.90 4.40 - 5.90 MIL/uL   Hemoglobin 13.8 13.0 - 18.0 g/dL   HCT 41.3 40.0 - 52.0 %   MCV 84.3 80.0 - 100.0 fL   MCH 28.2 26.0 - 34.0 pg   MCHC 33.5 32.0 - 36.0 g/dL   RDW 14.6 (H) 11.5 - 14.5 %   Platelets 316 150 - 440 K/uL   Neutrophils Relative % 70 %   Neutro Abs 8.1 (H) 1.4 - 6.5 K/uL   Lymphocytes Relative 18 %   Lymphs Abs 2.1 1.0  - 3.6 K/uL   Monocytes Relative 10 %   Monocytes Absolute 1.1 (H) 0.2 - 1.0 K/uL   Eosinophils Relative 1 %   Eosinophils Absolute 0.1 0 - 0.7 K/uL   Basophils Relative 1 %   Basophils Absolute 0.1 0 - 0.1 K/uL  Comprehensive metabolic panel  Status: Abnormal   Collection Time: 07/14/14  9:03 AM  Result Value Ref Range   Sodium 131 (L) 135 - 145 mmol/L   Potassium 4.1 3.5 - 5.1 mmol/L   Chloride 99 (L) 101 - 111 mmol/L   CO2 24 22 - 32 mmol/L   Glucose, Bld 130 (H) 65 - 99 mg/dL   BUN 13 6 - 20 mg/dL   Creatinine, Ser 0.65 0.61 - 1.24 mg/dL   Calcium 9.1 8.9 - 10.3 mg/dL   Total Protein 6.9 6.5 - 8.1 g/dL   Albumin 3.7 3.5 - 5.0 g/dL   AST 18 15 - 41 U/L   ALT 19 17 - 63 U/L   Alkaline Phosphatase 66 38 - 126 U/L   Total Bilirubin 0.8 0.3 - 1.2 mg/dL   GFR calc non Af Amer >60 >60 mL/min   GFR calc Af Amer >60 >60 mL/min    Comment: (NOTE) The eGFR has been calculated using the CKD EPI equation. This calculation has not been validated in all clinical situations. eGFR's persistently <60 mL/min signify possible Chronic Kidney Disease.    Anion gap 8 5 - 15  Fibrin derivatives D-Dimer     Status: None   Collection Time: 07/14/14  9:03 AM  Result Value Ref Range   Fibrin derivatives D-dimer ([facility]) 421 0 - 499  Troponin I     Status: None   Collection Time: 07/14/14  2:31 PM  Result Value Ref Range   Troponin I <0.03 <0.031 ng/mL    Comment:        NO INDICATION OF MYOCARDIAL INJURY.     ____________________________________________  EKG  ED ECG REPORT   Date: 07/14/2014  EKG Time: 14:18  Rate: 74  Rhythm: normal sinus rhythm, RBBB  Axis: Normal  Intervals:right bundle branch block  ST&T Change: Nonspecific ST changes in lateral leads but no ST elevation or depression to suggest acute ischemia  ____________________________________________  RADIOLOGY  Dg Chest 2 View  07/14/2014   CLINICAL DATA:  Weakness and shortness of breath. Respiratory distress.   EXAM: CHEST  2 VIEW  COMPARISON:  04/30/2014  FINDINGS: The patient has severe chronic interstitial and obstructive lung disease. Chronic blunting of the right costophrenic angle. Surgical clips in the right hilum and posteriorly in the right hemi thorax. Slight chronic bilateral apical pleural thickening. No acute osseous abnormality.  IMPRESSION: Severe chronic interstitial and obstructive lung disease. No acute abnormalities.   Electronically Signed   By: Lorriane Shire M.D.   On: 07/14/2014 14:51    ____________________________________________   PROCEDURES  Procedure(s) performed: None  Critical Care performed: No  ____________________________________________   INITIAL IMPRESSION / ASSESSMENT AND PLAN / ED COURSE  Pertinent labs & imaging results that were available during my care of the patient were reviewed by me and considered in my medical decision making (see chart for details).  My initial impression of the patient is that he is having a mild COPD exacerbation.  He was sent over given concern for EKG changes but at this time his EKG repeated in the emergency department is consistent with the one in urgent care and is not consistent with acute ischemia.  We will check a troponin with a lab redraw at this time and also check a chest x-ray.  I anticipate treatment for a COPD exacerbation with close follow-up with Dr. Raul Del todetermine the need for additional evaluation as an outpatient.  I discussed this with the patient is family and they understand and  agree with the plan.  ----------------------------------------- 3:51 PM on 07/14/2014 -----------------------------------------  The patient's troponin is negative and his chest x-ray is consistent with severe COPD.  He does not appear to have an infectious process at this time.  He felt better after a DuoNeb, and on repeat auscultations his lungs sound clear.  I discussed the results extensively with the patient and his daughters  and they agree that outpatient treatment for COPD and follow-up with Dr. Raul Del is most appropriate.  I gave my usual and customary return precautions ____________________________________________   FINAL CLINICAL IMPRESSION(S) / ED DIAGNOSES  Final diagnoses:  COPD with acute exacerbation     Hinda Kehr, MD 07/14/14 1555

## 2014-07-14 NOTE — Progress Notes (Signed)
   07/14/14 1500  Clinical Encounter Type  Visited With Patient and family together  Visit Type Initial  Referral From Nurse  Advance Directives (For Healthcare)  Does patient have an advance directive? No  Would patient like information on creating an advanced directive? Yes - Scientist, clinical (histocompatibility and immunogenetics) given  Provided AD education.  Patient may complete at a later time and location.  Will contact chaplain if patient changes his mind.  [place] [phone]

## 2014-07-27 LAB — TROPONIN I

## 2014-08-02 ENCOUNTER — Ambulatory Visit: Payer: Self-pay | Admitting: Internal Medicine

## 2014-08-03 ENCOUNTER — Inpatient Hospital Stay: Payer: No Typology Code available for payment source

## 2014-08-03 ENCOUNTER — Inpatient Hospital Stay
Payer: No Typology Code available for payment source | Attending: Hematology and Oncology | Admitting: Hematology and Oncology

## 2014-08-03 VITALS — BP 112/60 | HR 88 | Temp 97.7°F | Ht 67.0 in | Wt 150.8 lb

## 2014-08-03 DIAGNOSIS — Z809 Family history of malignant neoplasm, unspecified: Secondary | ICD-10-CM

## 2014-08-03 DIAGNOSIS — Z902 Acquired absence of lung [part of]: Secondary | ICD-10-CM | POA: Insufficient documentation

## 2014-08-03 DIAGNOSIS — R05 Cough: Secondary | ICD-10-CM | POA: Diagnosis not present

## 2014-08-03 DIAGNOSIS — D801 Nonfamilial hypogammaglobulinemia: Secondary | ICD-10-CM | POA: Diagnosis present

## 2014-08-03 DIAGNOSIS — E119 Type 2 diabetes mellitus without complications: Secondary | ICD-10-CM | POA: Diagnosis not present

## 2014-08-03 DIAGNOSIS — I10 Essential (primary) hypertension: Secondary | ICD-10-CM | POA: Diagnosis not present

## 2014-08-03 DIAGNOSIS — J449 Chronic obstructive pulmonary disease, unspecified: Secondary | ICD-10-CM | POA: Diagnosis not present

## 2014-08-03 DIAGNOSIS — B999 Unspecified infectious disease: Secondary | ICD-10-CM | POA: Insufficient documentation

## 2014-08-03 DIAGNOSIS — Z7982 Long term (current) use of aspirin: Secondary | ICD-10-CM | POA: Diagnosis not present

## 2014-08-03 DIAGNOSIS — Z8701 Personal history of pneumonia (recurrent): Secondary | ICD-10-CM

## 2014-08-03 DIAGNOSIS — R0602 Shortness of breath: Secondary | ICD-10-CM | POA: Diagnosis not present

## 2014-08-03 DIAGNOSIS — I2510 Atherosclerotic heart disease of native coronary artery without angina pectoris: Secondary | ICD-10-CM | POA: Insufficient documentation

## 2014-08-03 DIAGNOSIS — R5383 Other fatigue: Secondary | ICD-10-CM | POA: Diagnosis not present

## 2014-08-03 DIAGNOSIS — D72819 Decreased white blood cell count, unspecified: Secondary | ICD-10-CM | POA: Insufficient documentation

## 2014-08-03 DIAGNOSIS — D72829 Elevated white blood cell count, unspecified: Secondary | ICD-10-CM | POA: Insufficient documentation

## 2014-08-03 DIAGNOSIS — Z79899 Other long term (current) drug therapy: Secondary | ICD-10-CM | POA: Diagnosis not present

## 2014-08-03 DIAGNOSIS — Z8619 Personal history of other infectious and parasitic diseases: Secondary | ICD-10-CM | POA: Insufficient documentation

## 2014-08-03 DIAGNOSIS — Z87891 Personal history of nicotine dependence: Secondary | ICD-10-CM | POA: Insufficient documentation

## 2014-08-03 LAB — CBC WITH DIFFERENTIAL/PLATELET
Basophils Absolute: 0.2 10*3/uL — ABNORMAL HIGH (ref 0–0.1)
Basophils Relative: 1 %
Eosinophils Absolute: 0.2 10*3/uL (ref 0–0.7)
Eosinophils Relative: 2 %
HCT: 42.2 % (ref 40.0–52.0)
Hemoglobin: 14.1 g/dL (ref 13.0–18.0)
Lymphocytes Relative: 21 %
Lymphs Abs: 2.2 10*3/uL (ref 1.0–3.6)
MCH: 28.7 pg (ref 26.0–34.0)
MCHC: 33.5 g/dL (ref 32.0–36.0)
MCV: 85.7 fL (ref 80.0–100.0)
Monocytes Absolute: 1.1 10*3/uL — ABNORMAL HIGH (ref 0.2–1.0)
Monocytes Relative: 10 %
Neutro Abs: 6.8 10*3/uL — ABNORMAL HIGH (ref 1.4–6.5)
Neutrophils Relative %: 66 %
Platelets: 329 10*3/uL (ref 150–440)
RBC: 4.93 MIL/uL (ref 4.40–5.90)
RDW: 14.9 % — ABNORMAL HIGH (ref 11.5–14.5)
WBC: 10.5 10*3/uL (ref 3.8–10.6)

## 2014-08-03 LAB — C-REACTIVE PROTEIN: CRP: 0.7 mg/dL (ref <1.0)

## 2014-08-03 LAB — SEDIMENTATION RATE: Sed Rate: 11 mm/hr (ref 0–20)

## 2014-08-03 NOTE — Progress Notes (Signed)
Pt here today for initial consult regarding leukocytosis; offers no complaints today

## 2014-08-04 LAB — PROTEIN ELECTROPHORESIS, SERUM
A/G Ratio: 1.2 (ref 0.7–2.0)
Albumin ELP: 3.3 g/dL (ref 3.2–5.6)
Alpha-1-Globulin: 0.2 g/dL (ref 0.1–0.4)
Alpha-2-Globulin: 0.8 g/dL (ref 0.4–1.2)
Beta Globulin: 1 g/dL (ref 0.6–1.3)
Gamma Globulin: 0.6 g/dL (ref 0.5–1.6)
Globulin, Total: 2.7 g/dL (ref 2.0–4.5)
Total Protein ELP: 6 g/dL (ref 6.0–8.5)

## 2014-08-06 LAB — IGG, IGA, IGM
IgA: 283 mg/dL
IgG (Immunoglobin G), Serum: 563 mg/dL — ABNORMAL LOW (ref 700–1600)
IgM, Serum: 82 mg/dL

## 2014-08-10 ENCOUNTER — Ambulatory Visit: Payer: No Typology Code available for payment source | Admitting: Hematology and Oncology

## 2014-08-17 ENCOUNTER — Inpatient Hospital Stay (HOSPITAL_BASED_OUTPATIENT_CLINIC_OR_DEPARTMENT_OTHER): Payer: No Typology Code available for payment source | Admitting: Hematology and Oncology

## 2014-08-17 ENCOUNTER — Inpatient Hospital Stay: Payer: No Typology Code available for payment source

## 2014-08-17 ENCOUNTER — Encounter: Payer: Self-pay | Admitting: Hematology and Oncology

## 2014-08-17 VITALS — BP 106/60 | HR 73 | Temp 97.0°F | Ht 67.0 in | Wt 149.3 lb

## 2014-08-17 DIAGNOSIS — Z7982 Long term (current) use of aspirin: Secondary | ICD-10-CM

## 2014-08-17 DIAGNOSIS — Z79899 Other long term (current) drug therapy: Secondary | ICD-10-CM

## 2014-08-17 DIAGNOSIS — I10 Essential (primary) hypertension: Secondary | ICD-10-CM

## 2014-08-17 DIAGNOSIS — J449 Chronic obstructive pulmonary disease, unspecified: Secondary | ICD-10-CM

## 2014-08-17 DIAGNOSIS — R05 Cough: Secondary | ICD-10-CM

## 2014-08-17 DIAGNOSIS — D72829 Elevated white blood cell count, unspecified: Secondary | ICD-10-CM

## 2014-08-17 DIAGNOSIS — D72819 Decreased white blood cell count, unspecified: Secondary | ICD-10-CM

## 2014-08-17 DIAGNOSIS — Z809 Family history of malignant neoplasm, unspecified: Secondary | ICD-10-CM

## 2014-08-17 DIAGNOSIS — D801 Nonfamilial hypogammaglobulinemia: Secondary | ICD-10-CM | POA: Diagnosis not present

## 2014-08-17 DIAGNOSIS — B999 Unspecified infectious disease: Secondary | ICD-10-CM

## 2014-08-17 DIAGNOSIS — Z902 Acquired absence of lung [part of]: Secondary | ICD-10-CM

## 2014-08-17 DIAGNOSIS — Z8619 Personal history of other infectious and parasitic diseases: Secondary | ICD-10-CM

## 2014-08-17 DIAGNOSIS — I2510 Atherosclerotic heart disease of native coronary artery without angina pectoris: Secondary | ICD-10-CM

## 2014-08-17 DIAGNOSIS — E119 Type 2 diabetes mellitus without complications: Secondary | ICD-10-CM

## 2014-08-17 DIAGNOSIS — R0602 Shortness of breath: Secondary | ICD-10-CM

## 2014-08-17 DIAGNOSIS — Z8701 Personal history of pneumonia (recurrent): Secondary | ICD-10-CM

## 2014-08-17 DIAGNOSIS — R5383 Other fatigue: Secondary | ICD-10-CM

## 2014-08-17 DIAGNOSIS — Z87891 Personal history of nicotine dependence: Secondary | ICD-10-CM

## 2014-08-17 NOTE — Progress Notes (Signed)
Pt here today for follow up regarding leukocytosis; offers no complaints

## 2014-08-17 NOTE — Progress Notes (Signed)
[facility] day:  08/17/2014  Chief Complaint: Bradley [name] is an 64 y.o. male with persistent leukoocytosis who is seen for review of initial workup and discussion regarding direction of therapy.   HPI: The patient was last seen in the medical oncology clinic on 08/03/2014.  At that time, he was seen for initial consultation regarding persistent leukocytosis. Differential was predominantly neutrophils. He was noted to have COPD and a history of recurrent infections. He had Escherichia coli bacteremia in 04/2014.  With resolution of fevers and infections, his white blood cell count improved.  He underwent a workup. CBC revealed a hematocrit of 42.2, hemoglobin 14.1, platelets 329,000, white count 10,500 with an ANC of 6800. There were 1100 monocytes. White count was normal.  C reactive protein was 0.7 (normal).  Serum protein electrophoresis revealed no monoclonal spike. Immunoglobulins included an IgG of 563 ([phone]), IgA 283 and IgM 82.   Symptomatically , he states that he feels "pretty good", but not "wide-open". He has a clear cough. He states that the birds are out of the room and now he is breathing better.  Past Medical History  Diagnosis Date  . COPD (chronic obstructive pulmonary disease)   . Diabetes mellitus without complication   . Coronary artery disease   . Hypertension     Past Surgical History  Procedure Laterality Date  . Lung biopsy Right     2006    Family History  Problem Relation Age of Onset  . Cancer Mother   . Heart attack Father   . Cancer Sister     Social History:  reports that he has quit smoking. He has never used smokeless tobacco. He reports that he does not drink alcohol or use illicit drugs.  The patient is alone today.  Allergies: No Known Allergies  Current Medications: Current Outpatient Prescriptions  Medication Sig Dispense Refill  . albuterol-ipratropium (COMBIVENT) 18-103 MCG/ACT inhaler  Inhale into the lungs every 4 (four) hours.    Marland Kitchen aspirin 81 MG tablet Take 81 mg by mouth daily.    . Fluticasone-Salmeterol (ADVAIR) 250-50 MCG/DOSE AEPB Inhale 1 puff into the lungs 2 (two) times daily.    . metFORMIN (GLUCOPHAGE) 500 MG tablet Take by mouth 2 (two) times daily with a meal.    . metoprolol (LOPRESSOR) 50 MG tablet Take 50 mg by mouth 2 (two) times daily.    . nitroGLYCERIN (NITROSTAT) 0.4 MG SL tablet Place 0.4 mg under the tongue every 5 (five) minutes as needed for chest pain.    . simvastatin (ZOCOR) 20 MG tablet Take 20 mg by mouth daily.    Marland Kitchen tiotropium (SPIRIVA) 18 MCG inhalation capsule Place 18 mcg into inhaler and inhale daily.    . pantoprazole (PROTONIX) 40 MG tablet Take 40 mg by mouth daily.    . predniSONE (DELTASONE) 20 MG tablet Take 3 tablets (60 mg total) by mouth daily with breakfast. (Patient not taking: Reported on 08/03/2014) 15 tablet 0   No current facility-administered medications for this visit.    Review of Systems:  GENERAL: Feels "pretty good, but not wide open". No fevers, sweats or weight loss. PERFORMANCE STATUS (ECOG): 1 HEENT: No visual changes, runny nose, sore throat, mouth sores or tenderness. Lungs: Chronic shortness of breath and clear cough. No hemoptysis.  Breathing better. Cardiac: No chest pain, palpitations, orthopnea, or PND. GI: No nausea, vomiting, diarrhea, constipation, melena or hematochezia. GU: No urgency, frequency, dysuria, or hematuria. Musculoskeletal:  No back pain. Left knee acts up. No muscle tenderness. Extremities: No pain or swelling. Skin: No rashes or skin changes. Neuro: No headache, numbness or weakness, balance or coordination issues. Endocrine: Diabetes. No thyroid issues, or night sweats. Psych: No mood changes, depression or anxiety. Pain: No focal pain. Review of systems: All other systems reviewed and found to be negative.  Physical Exam: Blood pressure 106/60, pulse 73,  temperature 97 F (36.1 C), temperature source Tympanic, height '5\' 7"'$  (1.702 m), weight 149 lb 4 oz (67.7 kg). GENERAL:  Well developed, well nourished, sitting comfortably in the exam room in no acute distress. MENTAL STATUS:  Alert and oriented to person, place and time. HEAD:  Wearing a cap.  Gray hair with mustache.  Normocephalic, atraumatic, face symmetric, no Cushingoid features. EYES:  No conjunctivitis or scleral icterus. PSYCH:  Appropriate.  No visits with results within 3 Day(s) from this visit. Latest known visit with results is:  Appointment on 08/03/2014  Component Date Value Ref Range Status  . WBC 08/03/2014 10.5  3.8 - 10.6 K/uL Final  . RBC 08/03/2014 4.93  4.40 - 5.90 MIL/uL Final  . Hemoglobin 08/03/2014 14.1  13.0 - 18.0 g/dL Final  . HCT 08/03/2014 42.2  40.0 - 52.0 % Final  . MCV 08/03/2014 85.7  80.0 - 100.0 fL Final  . MCH 08/03/2014 28.7  26.0 - 34.0 pg Final  . MCHC 08/03/2014 33.5  32.0 - 36.0 g/dL Final  . RDW 08/03/2014 14.9* 11.5 - 14.5 % Final  . Platelets 08/03/2014 329  150 - 440 K/uL Final  . Neutrophils Relative % 08/03/2014 66   Final  . Neutro Abs 08/03/2014 6.8* 1.4 - 6.5 K/uL Final  . Lymphocytes Relative 08/03/2014 21   Final  . Lymphs Abs 08/03/2014 2.2  1.0 - 3.6 K/uL Final  . Monocytes Relative 08/03/2014 10   Final  . Monocytes Absolute 08/03/2014 1.1* 0.2 - 1.0 K/uL Final  . Eosinophils Relative 08/03/2014 2   Final  . Eosinophils Absolute 08/03/2014 0.2  0 - 0.7 K/uL Final  . Basophils Relative 08/03/2014 1   Final  . Basophils Absolute 08/03/2014 0.2* 0 - 0.1 K/uL Final  . CRP 08/03/2014 0.7  <1.0 mg/dL Final   Performed at [facility]  . Sed Rate 08/03/2014 11  0 - 20 mm/hr Final  . Total Protein ELP 08/03/2014 6.0  6.0 - 8.5 g/dL Final  . Albumin ELP 08/03/2014 3.3  3.2 - 5.6 g/dL Final   Comment: (NOTE) **Effective August 15, 2014 the reference interval for**  Albumin will be changing to:                        0 - 30  days         Not Estab.                           >30 days         2.9 - 4.4   . Alpha-1-Globulin 08/03/2014 0.2  0.1 - 0.4 g/dL Final   Comment: (NOTE) **Effective August 15, 2014 the reference interval for**  Alpha-1-Globulin will be changing to:                        0 - 30 days         Not Estab.                           >  30 days         0.0 - 0.4   . Alpha-2-Globulin 08/03/2014 0.8  0.4 - 1.2 g/dL Final   Comment: (NOTE) **Effective August 15, 2014 the reference interval for**  Alpha-2-Globulin will be changing to:                        0 - 30 days         Not Estab.                           >30 days         0.4 - 1.0   . Beta Globulin 08/03/2014 1.0  0.6 - 1.3 g/dL Final   Comment: (NOTE) **Effective August 15, 2014 the reference interval for**  Beta Globulin will be changing to:                        0 - 30 days         Not Estab.                        1 -  6 months       0.5 - 1.3                            >6 months       0.7 - 1.3   . Gamma Globulin 08/03/2014 0.6  0.5 - 1.6 g/dL Final   Comment: (NOTE) **Effective August 15, 2014 the reference interval for**  Gamma Globulin will be changing to:                        0 - 30 days         Not Estab.                        1 -  6 months       0.3 - 1.6                 7 months -  5 years        0.4 - 1.3                        6 - 17 years        0.6 - 1.5                           >17 years        0.4 - 1.8   . M-Spike, % 08/03/2014 Not Observed  Not Observed g/dL Final  . SPE Interp. 08/03/2014 Comment   Final   Comment: (NOTE) The SPE pattern appears essentially unremarkable. Evidence of monoclonal protein is not apparent. Performed At: [facility] [place], [place] [id number] Lindon Romp MD FT:[id number]   . Comment 08/03/2014 Comment   Final   Comment: (NOTE) Protein electrophoresis scan will follow via computer, mail, or courier delivery.   Marland Kitchen GLOBULIN, TOTAL 08/03/2014 2.7  2.0  - 4.5 g/dL Corrected   Comment: (NOTE) **Effective August 15, 2014 the reference interval for**  Globulin, Total will be changing to:  0 - 30 days         Not Estab.                           >30 days         2.2 - 3.9   . A/G Ratio 08/03/2014 1.2  0.7 - 2.0 Corrected   Comment: (NOTE) **Effective August 15, 2014 the reference interval for**  A/G Ratio will be changing to:                        0 - 30 days         Not Estab.                           >30 days         0.7 - 1.7   . IgG (Immunoglobin G), Serum 08/03/2014 563* 700 - 1600 mg/dL Final  . IgA 08/03/2014 283   Final   Comment: (NOTE)                No patient age and/or gender provided                      Male                            0 - 10 days       2 - 362                      11 days -  6 months     8 - 37                            7 - 11 months    12 - 12                            1 -  3 years     21 - 111                            4 - 15 years     58 - 48                           16 - 25 years     3 - 59                               >60 years     70 - 22                      Male                            0 - 10 days       2 - 362                      11 days -  6 months     8 - 75  7 - 11 months    11 - 45                            1 -  3 years     19 - 102                            4 - 15 years     5 - 220                           16 - 70 years     54 - 352                               >70 years     47 - 422   . IgM, Serum 08/03/2014 82   Final   Comment: (NOTE)                No patient age and/or gender provided                      Male                            0 - 10 days       3 - 10                      11 days -  6 months    18 - 91                            7 - 11 months    27 - 112                            1 -  3 years     76 - 146                            4 -  6 years     22 - 152                            7 -  9 years      65 - 151                           10 - 11 years     29 - 153                           12 - 13 years     79 - 156                           75 - 15 years     80 - 163                           36 - 19 years     61 - 44  20 - 70 years     20 - 172                               >70 years     59 - 143                      Male                            0 - 10 days       1 - 19                      11 days -  6 months    18 - 96                            7 - 11 months    32 - 127                            1 -  3 years     45 - 163                                                      4 -  6 years     85 - 181                            7 -  9 years     4 - 187                           10 - 11 years     42 - 194                           12 - 13 years     66 - 70                           57 - 15 years     42 - 43                           16 - 4 years     78 - 20                               >19 years     58 - 217 Performed At: [facility] [address] [place], [place] [id number] Lindon Romp MD SH:[id number]     Assessment:  Bradley [name] is an 64 y.o. male with a COPD and a history of recurrent infections. He has reactive leukocytosis. In 04/2014 he developed an Escherichia coli bacteremia. With resolution of fevers and infections, his white count improves. He takes oral prednisone 1-2 times a year as well as steroid injections which can increase the white cell count. Peripheral smear has typically been predominant neutrophils.  Workup on 08/03/2014 revealed a normal WBC and differential, CRP, and SPEP.  Immunoglobulins revealed an IgG level of 563 (normal [phone]) consistent with hypogammaglobulinemia.  He denies any B symptoms. He does note being exhausted for the past 2 years up. Exam reveals no adenopathy or hepatosplenomegaly  Plan: 1. Review workup and resolution of leukocytosis. Discuss hypogammaglobulinemia. 2. Labs today:   free light chain assay, CH50, C3, C4. 3. 24 hour urine for UPEP and free light chains. 4. RTC in 2 weeks for review of additional work-up.  Lequita Asal, MD  08/17/2014, 4:57 PM

## 2014-08-18 LAB — KAPPA/LAMBDA LIGHT CHAINS
Kappa free light chain: 19.36 mg/L (ref 3.30–19.40)
Kappa, lambda light chain ratio: 1.26 (ref 0.26–1.65)
Lambda free light chains: 15.34 mg/L (ref 5.71–26.30)

## 2014-08-18 LAB — C3 COMPLEMENT: C3 Complement: 131 mg/dL (ref 82–167)

## 2014-08-18 LAB — COMPLEMENT, TOTAL: Compl, Total (CH50): >60 U/mL (ref 42–60)

## 2014-08-18 LAB — C4 COMPLEMENT: Complement C4, Body Fluid: 29 mg/dL (ref 14–44)

## 2014-08-19 ENCOUNTER — Inpatient Hospital Stay: Payer: No Typology Code available for payment source

## 2014-08-19 DIAGNOSIS — B999 Unspecified infectious disease: Secondary | ICD-10-CM

## 2014-08-19 DIAGNOSIS — D801 Nonfamilial hypogammaglobulinemia: Secondary | ICD-10-CM

## 2014-08-21 ENCOUNTER — Encounter: Payer: Self-pay | Admitting: Hematology and Oncology

## 2014-08-21 NOTE — Progress Notes (Signed)
[facility] day:  08/03/2014  Chief Complaint: Bradley [name] is a 64 y.o. male with persistent leukoocytosis who is referred in consultation by Dr. Johny Drilling.   HPI:  The patient's history is notable for hospitalization for pneumonia in 12/2009. He underwent right lower lobe lobectomy in 2006. No malignancy was discovered. He states that the problem was due to "pneumonia".  He notes a history of chest pain and indigestion in 2015. White count was elevated. He states he was put on antibiotics.  His symptoms and white count improved.   He was admitted to [facility] from 04/30/2014 until 05/05/2014. He presented with shortness of breath and cough. He was felt to have acute respiratory failure due to a COPD exacerbation. He was treated with oxygen, Xopenex, Solu-Medrol, and antibiotics. He developed E coli bacteremia.  He was seen in consultation by Dr Ola Spurr of infectious disease. Notes indicate a peak white count of 25,700 with 93% segs on 05/01/2014. On 05/04/2014, white count had improved to 14,500. Abdominal and pelvic CT on 05/03/2014 revealed no intra-abdominal source of infection.  CXR revealed chronic changes.  He was recently seen in the [facility] ER on 07/14/2014 with 2 days of increased respiratory difficulties and generalized weakness. He was diagnosed with a COPD flare. CBC included a hematocrit of 41.3, hemoglobin 13.8, platelets 316,000, white count 11,600 with an ANC of 8100.  He states that 3 weeks ago he was placed on a prednisone pulse (60 mg a day) for 4 days. He is on prednisone 1-2 times a year. He comments that "a time or two" he had a steroid shot in his hip.  Symptomatically, he feels worn out and exhausted about 3:00 PM. He works for the [employer].  He is up at 5:30 AM.  He gets 6 hours of sleep. He has felt exhausted for the past 2 years. He denies any fevers. He notes trouble with his sinuses secondary to  allergies. He has chronic shortness of breath and a clear cough. He has some hot flashes at night. He notes that his left knee acts up once in a while.  He has a parrot at home.  Past Medical History  Diagnosis Date  . COPD (chronic obstructive pulmonary disease)   . Diabetes mellitus without complication   . Coronary artery disease   . Hypertension     Past Surgical History  Procedure Laterality Date  . Lung biopsy Right     2006    Family History  Problem Relation Age of Onset  . Cancer Mother   . Heart attack Father   . Cancer Sister     Social History:  reports that he has quit smoking. He has never used smokeless tobacco. He reports that he does not drink alcohol or use illicit drugs.  The patient is alone today.  Allergies: No Known Allergies  Current Medications: Current Outpatient Prescriptions  Medication Sig Dispense Refill  . albuterol-ipratropium (COMBIVENT) 18-103 MCG/ACT inhaler Inhale into the lungs every 4 (four) hours.    Marland Kitchen aspirin 81 MG tablet Take 81 mg by mouth daily.    . Fluticasone-Salmeterol (ADVAIR) 250-50 MCG/DOSE AEPB Inhale 1 puff into the lungs 2 (two) times daily.    . metFORMIN (GLUCOPHAGE) 500 MG tablet Take by mouth 2 (two) times daily with a meal.    . metoprolol (LOPRESSOR) 50 MG tablet Take 50 mg by mouth 2 (two) times daily.    Marland Kitchen  nitroGLYCERIN (NITROSTAT) 0.4 MG SL tablet Place 0.4 mg under the tongue every 5 (five) minutes as needed for chest pain.    . simvastatin (ZOCOR) 20 MG tablet Take 20 mg by mouth daily.    Marland Kitchen tiotropium (SPIRIVA) 18 MCG inhalation capsule Place 18 mcg into inhaler and inhale daily.    . pantoprazole (PROTONIX) 40 MG tablet Take 40 mg by mouth daily.    . predniSONE (DELTASONE) 20 MG tablet Take 3 tablets (60 mg total) by mouth daily with breakfast. (Patient not taking: Reported on 08/03/2014) 15 tablet 0   No current facility-administered medications for this visit.    Review of Systems:  GENERAL:  Feels worn  out and exhausted x 2 years.  No fevers, sweats or weight loss. PERFORMANCE STATUS (ECOG):  1 HEENT:  No visual changes, runny nose, sore throat, mouth sores or tenderness. Lungs: Chronic shortness of breath and clear cough.  No hemoptysis. Cardiac:  No chest pain, palpitations, orthopnea, or PND. GI:  No nausea, vomiting, diarrhea, constipation, melena or hematochezia.  Colonoscopy 2 years ago. GU:  No urgency, frequency, dysuria, or hematuria. Musculoskeletal:  No back pain.  Left knee acts up.  No muscle tenderness. Extremities:  No pain or swelling. Skin:  No rashes or skin changes. Neuro:  No headache, numbness or weakness, balance or coordination issues. Endocrine:  Hot flashes at night. Diabetes.  No thyroid issues, or night sweats. Psych:  No mood changes, depression or anxiety. Pain:  No focal pain. Review of systems:  All other systems reviewed and found to be negative.   Physical Exam: Blood pressure 112/60, pulse 88, temperature 97.7 F (36.5 C), temperature source Tympanic, height 5' 7"  (1.702 m), weight 150 lb 12.7 oz (68.4 kg). GENERAL:  Well developed, well nourished, sitting comfortably in the exam room in no acute distress. MENTAL STATUS:  Alert and oriented to person, place and time. HEAD:  Wearing a cap.  Gray hair and [employer].  Normocephalic, atraumatic, face symmetric, no Cushingoid features. EYES: Pupils equal round and reactive to light and accomodation.  No conjunctivitis or scleral icterus. ENT:  Oropharynx clear without lesion.  Dentures.  Tongue normal. Mucous membranes moist.  RESPIRATORY:  Decreased breath sounds.  Clear to auscultation without rales, wheezes or rhonchi. CARDIOVASCULAR:  Regular rate and rhythm without murmur, rub or gallop. ABDOMEN:  Soft, non-tender, with active bowel sounds, and no hepatosplenomegaly.  No masses. SKIN:  Tan.  No rashes, ulcers or lesions. EXTREMITIES: Mild clubbing.  No edema, no skin discoloration or tenderness.  No  palpable cords. LYMPH NODES: No palpable cervical, supraclavicular, axillary or inguinal adenopathy  NEUROLOGICAL: Unremarkable. PSYCH:  Appropriate.   Appointment on 08/03/2014  Component Date Value Ref Range Status  . WBC 08/03/2014 10.5  3.8 - 10.6 K/uL Final  . RBC 08/03/2014 4.93  4.40 - 5.90 MIL/uL Final  . Hemoglobin 08/03/2014 14.1  13.0 - 18.0 g/dL Final  . HCT 08/03/2014 42.2  40.0 - 52.0 % Final  . MCV 08/03/2014 85.7  80.0 - 100.0 fL Final  . MCH 08/03/2014 28.7  26.0 - 34.0 pg Final  . MCHC 08/03/2014 33.5  32.0 - 36.0 g/dL Final  . RDW 08/03/2014 14.9* 11.5 - 14.5 % Final  . Platelets 08/03/2014 329  150 - 440 K/uL Final  . Neutrophils Relative % 08/03/2014 66   Final  . Neutro Abs 08/03/2014 6.8* 1.4 - 6.5 K/uL Final  . Lymphocytes Relative 08/03/2014 21   Final  . Lymphs Abs  08/03/2014 2.2  1.0 - 3.6 K/uL Final  . Monocytes Relative 08/03/2014 10   Final  . Monocytes Absolute 08/03/2014 1.1* 0.2 - 1.0 K/uL Final  . Eosinophils Relative 08/03/2014 2   Final  . Eosinophils Absolute 08/03/2014 0.2  0 - 0.7 K/uL Final  . Basophils Relative 08/03/2014 1   Final  . Basophils Absolute 08/03/2014 0.2* 0 - 0.1 K/uL Final  . CRP 08/03/2014 0.7  <1.0 mg/dL Final   Performed at [facility]  . Sed Rate 08/03/2014 11  0 - 20 mm/hr Final  . Total Protein ELP 08/03/2014 6.0  6.0 - 8.5 g/dL Final  . Albumin ELP 08/03/2014 3.3  3.2 - 5.6 g/dL Final   Comment: (NOTE) **Effective August 15, 2014 the reference interval for**  Albumin will be changing to:                        0 - 30 days         Not Estab.                           >30 days         2.9 - 4.4   . Alpha-1-Globulin 08/03/2014 0.2  0.1 - 0.4 g/dL Final   Comment: (NOTE) **Effective August 15, 2014 the reference interval for**  Alpha-1-Globulin will be changing to:                        0 - 30 days         Not Estab.                           >30 days         0.0 - 0.4   . Alpha-2-Globulin 08/03/2014 0.8  0.4  - 1.2 g/dL Final   Comment: (NOTE) **Effective August 15, 2014 the reference interval for**  Alpha-2-Globulin will be changing to:                        0 - 30 days         Not Estab.                           >30 days         0.4 - 1.0   . Beta Globulin 08/03/2014 1.0  0.6 - 1.3 g/dL Final   Comment: (NOTE) **Effective August 15, 2014 the reference interval for**  Beta Globulin will be changing to:                        0 - 30 days         Not Estab.                        1 -  6 months       0.5 - 1.3                            >6 months       0.7 - 1.3   . Gamma Globulin 08/03/2014 0.6  0.5 - 1.6 g/dL Final   Comment: (NOTE) **Effective August 15, 2014 the reference interval for**  Gamma  Globulin will be changing to:                        0 - 30 days         Not Estab.                        1 -  6 months       0.3 - 1.6                 7 months -  5 years        0.4 - 1.3                        6 - 17 years        0.6 - 1.5                           >17 years        0.4 - 1.8   . M-Spike, % 08/03/2014 Not Observed  Not Observed g/dL Final  . SPE Interp. 08/03/2014 Comment   Final   Comment: (NOTE) The SPE pattern appears essentially unremarkable. Evidence of monoclonal protein is not apparent. Performed At: [facility] [place], [place] [id number] Lindon Romp MD [id number]   . Comment 08/03/2014 Comment   Final   Comment: (NOTE) Protein electrophoresis scan will follow via computer, mail, or courier delivery.   Marland Kitchen GLOBULIN, TOTAL 08/03/2014 2.7  2.0 - 4.5 g/dL Corrected   Comment: (NOTE) **Effective August 15, 2014 the reference interval for**  Globulin, Total will be changing to:                        0 - 30 days         Not Estab.                           >30 days         2.2 - 3.9   . A/G Ratio 08/03/2014 1.2  0.7 - 2.0 Corrected   Comment: (NOTE) **Effective August 15, 2014 the reference interval for**  A/G Ratio will be changing to:                         0 - 30 days         Not Estab.                           >30 days         0.7 - 1.7   . IgG (Immunoglobin G), Serum 08/03/2014 563* 700 - 1600 mg/dL Final  . IgA 08/03/2014 283   Final   Comment: (NOTE)                No patient age and/or gender provided                      Male                            0 - 10 days       2 - 362  11 days -  6 months     8 - 37                            7 - 11 months    12 - 58                            1 -  3 years     21 - 111                            4 - 15 years     50 - 221                           16 - 60 years     90 - 386                               >60 years     95 - 437                      Male                            0 - 10 days       2 - 362                      11 days -  6 months     8 - 32                            7 - 11 months    11 - 45                            1 -  3 years     31 - 102                            4 - 15 years     71 - 220                           59 - 70 years     35 - 352                               >70 years     74 - 422   . IgM, Serum 08/03/2014 82   Final   Comment: (NOTE)                No patient age and/or gender provided                      Male                            0 - 10 days       3 - 46  11 days -  6 months    18 - 91                            7 - 11 months    27 - 112                            1 -  3 years     39 - 146                            4 -  6 years     40 - 152                            7 -  9 years     89 - 151                           10 - 11 years     89 - 153                           12 - 13 years     13 - 156                           14 - 15 years     13 - 163                           16 - 19 years     49 - 168                           20 - 70 years     20 - 172                               >70 years     76 - 61                      Male                            0 - 10 days        1 - 19                      11 days -  6 months    18 - 96                            7 - 11 months    32 - 127                            1 -  3 years     75 - 163  4 -  6 years     51 - 181                            7 -  9 years     23 - 187                           10 - 11 years     46 - 194                           12 - 13 years     23 - 209                           47 - 15 years     74 - 220                           35 - 19 years     37 - 27                               >19 years     26 - 217 Performed At: [facility] [address] [place], [place] [id number] Lindon Romp MD JT:[id number]     Assessment:  Bradley [name] is a 64 y.o. male  with a COPD and a history of recurrent infections. He appears to have a reactive leukocytosis. In 02/016 he developed an Escherichia coli bacteremia. With resolution of fevers and infections, his white count improves. He takes oral prednisone 1-2 times a year as well as steroid injections which can increase the white cell count. Peripheral smear has typically been predominant neutrophils.  He denies any B symptoms. He does note being exhausted for the past 2 years up. Exam reveals no adenopathy or hepatosplenomegaly.  Plan: 1. Discuss likely diagnosis of reactive leukocytosis. I discussed screening labs (CBC with diff, sedimentation rate, SPEP, and immunoglobulin levels).  If peripheral smear is concerning, will send flow cytometry. 2. Labs today:  CBC with diff, CRP. ESR, SPEP, immunoglobulin levels. 3. Return to clinic to discuss the results.   Lequita Asal, M.D. 08/03/2014, 4:34 PM

## 2014-08-22 LAB — UPEP/TP, 24-HR URINE
Albumin, U: 25.6 %
Alpha 1, Urine: 8.6 %
Alpha 2, Urine: 16.4 %
Beta, Urine: 38.7 %
Gamma Globulin, Urine: 10.6 %
Total Protein, Urine-Ur/day: 165.6 mg/24 hr — ABNORMAL HIGH (ref 30.0–150.0)
Total Protein, Urine: 14.4 mg/dL
Total Volume: 1150

## 2014-10-08 ENCOUNTER — Ambulatory Visit
Admission: EM | Admit: 2014-10-08 | Discharge: 2014-10-08 | Disposition: A | Discharge status: Home / Self Care | Payer: 59 | Attending: Internal Medicine | Admitting: Internal Medicine

## 2014-10-08 ENCOUNTER — Encounter: Payer: Self-pay | Admitting: Internal Medicine

## 2014-10-08 ENCOUNTER — Observation Stay: Payer: 59

## 2014-10-08 ENCOUNTER — Inpatient Hospital Stay
Admission: AD | Admit: 2014-10-08 | Discharge: 2014-10-11 | DRG: 871 | Disposition: A | Discharge status: Home / Self Care | Payer: 59 | Source: Other Acute Inpatient Hospital | Attending: Internal Medicine | Admitting: Internal Medicine

## 2014-10-08 DIAGNOSIS — E1165 Type 2 diabetes mellitus with hyperglycemia: Secondary | ICD-10-CM | POA: Diagnosis present

## 2014-10-08 DIAGNOSIS — Z79899 Other long term (current) drug therapy: Secondary | ICD-10-CM | POA: Diagnosis not present

## 2014-10-08 DIAGNOSIS — Z7982 Long term (current) use of aspirin: Secondary | ICD-10-CM | POA: Insufficient documentation

## 2014-10-08 DIAGNOSIS — J9621 Acute and chronic respiratory failure with hypoxia: Secondary | ICD-10-CM | POA: Insufficient documentation

## 2014-10-08 DIAGNOSIS — Z87891 Personal history of nicotine dependence: Secondary | ICD-10-CM | POA: Diagnosis not present

## 2014-10-08 DIAGNOSIS — I2510 Atherosclerotic heart disease of native coronary artery without angina pectoris: Secondary | ICD-10-CM | POA: Diagnosis present

## 2014-10-08 DIAGNOSIS — D72829 Elevated white blood cell count, unspecified: Secondary | ICD-10-CM

## 2014-10-08 DIAGNOSIS — E119 Type 2 diabetes mellitus without complications: Secondary | ICD-10-CM | POA: Insufficient documentation

## 2014-10-08 DIAGNOSIS — R0602 Shortness of breath: Secondary | ICD-10-CM | POA: Diagnosis present

## 2014-10-08 DIAGNOSIS — J449 Chronic obstructive pulmonary disease, unspecified: Secondary | ICD-10-CM | POA: Insufficient documentation

## 2014-10-08 DIAGNOSIS — E785 Hyperlipidemia, unspecified: Secondary | ICD-10-CM | POA: Diagnosis present

## 2014-10-08 DIAGNOSIS — J441 Chronic obstructive pulmonary disease with (acute) exacerbation: Secondary | ICD-10-CM | POA: Diagnosis present

## 2014-10-08 DIAGNOSIS — I10 Essential (primary) hypertension: Secondary | ICD-10-CM | POA: Diagnosis not present

## 2014-10-08 DIAGNOSIS — R918 Other nonspecific abnormal finding of lung field: Secondary | ICD-10-CM | POA: Diagnosis present

## 2014-10-08 DIAGNOSIS — K219 Gastro-esophageal reflux disease without esophagitis: Secondary | ICD-10-CM | POA: Diagnosis present

## 2014-10-08 DIAGNOSIS — T380X5A Adverse effect of glucocorticoids and synthetic analogues, initial encounter: Secondary | ICD-10-CM | POA: Diagnosis present

## 2014-10-08 DIAGNOSIS — R590 Localized enlarged lymph nodes: Secondary | ICD-10-CM | POA: Diagnosis present

## 2014-10-08 DIAGNOSIS — J189 Pneumonia, unspecified organism: Secondary | ICD-10-CM | POA: Diagnosis present

## 2014-10-08 DIAGNOSIS — A419 Sepsis, unspecified organism: Principal | ICD-10-CM | POA: Diagnosis present

## 2014-10-08 DIAGNOSIS — Z7952 Long term (current) use of systemic steroids: Secondary | ICD-10-CM

## 2014-10-08 LAB — GLUCOSE, CAPILLARY
GLUCOSE-CAPILLARY: 250 mg/dL — AB (ref 65–99)
Glucose-Capillary: 186 mg/dL — ABNORMAL HIGH (ref 65–99)

## 2014-10-08 LAB — CBC WITH DIFFERENTIAL/PLATELET
BASOS ABS: 0 10*3/uL (ref 0–0.1)
Basophils Relative: 0 %
Eosinophils Absolute: 0 10*3/uL (ref 0–0.7)
Eosinophils Relative: 0 %
HCT: 38.5 % — ABNORMAL LOW (ref 40.0–52.0)
HEMOGLOBIN: 12.8 g/dL — AB (ref 13.0–18.0)
Lymphocytes Relative: 8 %
Lymphs Abs: 1.8 10*3/uL (ref 1.0–3.6)
MCH: 27.2 pg (ref 26.0–34.0)
MCHC: 33.2 g/dL (ref 32.0–36.0)
MCV: 81.9 fL (ref 80.0–100.0)
MONOS PCT: 10 %
Monocytes Absolute: 2.2 10*3/uL — ABNORMAL HIGH (ref 0.2–1.0)
Neutro Abs: 18.5 10*3/uL — ABNORMAL HIGH (ref 1.4–6.5)
Neutrophils Relative %: 82 %
PLATELETS: 554 10*3/uL — AB (ref 150–440)
RBC: 4.7 MIL/uL (ref 4.40–5.90)
RDW: 14.3 % (ref 11.5–14.5)
WBC: 22.5 10*3/uL — AB (ref 3.8–10.6)

## 2014-10-08 LAB — COMPREHENSIVE METABOLIC PANEL
ALT: 23 U/L (ref 17–63)
AST: 18 U/L (ref 15–41)
Albumin: 2.8 g/dL — ABNORMAL LOW (ref 3.5–5.0)
Alkaline Phosphatase: 97 U/L (ref 38–126)
Anion gap: 9 (ref 5–15)
BUN: 14 mg/dL (ref 6–20)
CALCIUM: 8.9 mg/dL (ref 8.9–10.3)
CHLORIDE: 92 mmol/L — AB (ref 101–111)
CO2: 29 mmol/L (ref 22–32)
Creatinine, Ser: 0.7 mg/dL (ref 0.61–1.24)
GFR calc Af Amer: >60 mL/min (ref >60)
GFR calc non Af Amer: >60 mL/min (ref >60)
Glucose, Bld: 183 mg/dL — ABNORMAL HIGH (ref 65–99)
Potassium: 4.1 mmol/L (ref 3.5–5.1)
SODIUM: 130 mmol/L — AB (ref 135–145)
Total Bilirubin: 0.4 mg/dL (ref 0.3–1.2)
Total Protein: 7.1 g/dL (ref 6.5–8.1)

## 2014-10-08 MED ORDER — IPRATROPIUM-ALBUTEROL 0.5-2.5 (3) MG/3ML IN SOLN
3.0000 mL | Freq: Four times a day (QID) | RESPIRATORY_TRACT | Status: DC
  Administered 2014-10-08: 3 mL via RESPIRATORY_TRACT

## 2014-10-08 MED ORDER — METHYLPREDNISOLONE SODIUM SUCC 125 MG IJ SOLR
125.0000 mg | Freq: Once | INTRAMUSCULAR | Status: AC
  Administered 2014-10-08: 125 mg via INTRAVENOUS

## 2014-10-08 MED ORDER — FUROSEMIDE 10 MG/ML IJ SOLN
40.0000 mg | Freq: Once | INTRAMUSCULAR | Status: AC
  Administered 2014-10-08: 40 mg via INTRAVENOUS

## 2014-10-08 MED ORDER — IPRATROPIUM-ALBUTEROL 0.5-2.5 (3) MG/3ML IN SOLN
3.0000 mL | Freq: Four times a day (QID) | RESPIRATORY_TRACT | Status: DC
  Administered 2014-10-08 – 2014-10-11 (×11): 3 mL via RESPIRATORY_TRACT
  Filled 2014-10-08 (×11): qty 3

## 2014-10-08 MED ORDER — SODIUM CHLORIDE 0.9 % IV SOLN
INTRAVENOUS | Status: DC
  Administered 2014-10-08 – 2014-10-09 (×2): via INTRAVENOUS

## 2014-10-08 MED ORDER — NITROGLYCERIN 0.4 MG SL SUBL: 0.4000 mg | SUBLINGUAL_TABLET | SUBLINGUAL | Status: DC | PRN

## 2014-10-08 MED ORDER — FUROSEMIDE 10 MG/ML IJ SOLN: 40.0000 mg | Freq: Once | INTRAMUSCULAR | Status: DC

## 2014-10-08 MED ORDER — METFORMIN HCL 500 MG PO TABS
500.0000 mg | ORAL_TABLET | Freq: Two times a day (BID) | ORAL | Status: DC
  Administered 2014-10-08 – 2014-10-11 (×6): 500 mg via ORAL
  Filled 2014-10-08 (×6): qty 1

## 2014-10-08 MED ORDER — IPRATROPIUM-ALBUTEROL 0.5-2.5 (3) MG/3ML IN SOLN
3.0000 mL | Freq: Once | RESPIRATORY_TRACT | Status: AC
  Administered 2014-10-08: 3 mL via RESPIRATORY_TRACT

## 2014-10-08 MED ORDER — SIMVASTATIN 20 MG PO TABS
20.0000 mg | ORAL_TABLET | Freq: Every day | ORAL | Status: DC
  Administered 2014-10-08 – 2014-10-11 (×4): 20 mg via ORAL
  Filled 2014-10-08 (×4): qty 1

## 2014-10-08 MED ORDER — ACETAMINOPHEN 650 MG RE SUPP: 650.0000 mg | Freq: Four times a day (QID) | RECTAL | Status: DC | PRN

## 2014-10-08 MED ORDER — METOPROLOL TARTRATE 50 MG PO TABS
50.0000 mg | ORAL_TABLET | Freq: Two times a day (BID) | ORAL | Status: DC
  Administered 2014-10-08 – 2014-10-11 (×6): 50 mg via ORAL
  Filled 2014-10-08 (×6): qty 1

## 2014-10-08 MED ORDER — BISACODYL 10 MG RE SUPP: 10.0000 mg | Freq: Every day | RECTAL | Status: DC | PRN

## 2014-10-08 MED ORDER — DEXTROSE 5 % IV SOLN
500.0000 mg | INTRAVENOUS | Status: DC
  Administered 2014-10-08 – 2014-10-10 (×3): 500 mg via INTRAVENOUS
  Filled 2014-10-08 (×4): qty 500

## 2014-10-08 MED ORDER — ACETAMINOPHEN 325 MG PO TABS: 650.0000 mg | ORAL_TABLET | Freq: Four times a day (QID) | ORAL | Status: DC | PRN

## 2014-10-08 MED ORDER — ASPIRIN EC 81 MG PO TBEC
81.0000 mg | DELAYED_RELEASE_TABLET | Freq: Every day | ORAL | Status: DC
  Administered 2014-10-09 – 2014-10-11 (×3): 81 mg via ORAL
  Filled 2014-10-08 (×3): qty 1

## 2014-10-08 MED ORDER — METHYLPREDNISOLONE SODIUM SUCC 125 MG IJ SOLR
60.0000 mg | Freq: Four times a day (QID) | INTRAMUSCULAR | Status: DC
  Administered 2014-10-08 – 2014-10-09 (×4): 60 mg via INTRAVENOUS
  Filled 2014-10-08 (×4): qty 2

## 2014-10-08 MED ORDER — ONDANSETRON HCL 4 MG/2ML IJ SOLN: 4.0000 mg | Freq: Four times a day (QID) | INTRAMUSCULAR | Status: DC | PRN

## 2014-10-08 MED ORDER — TIOTROPIUM BROMIDE MONOHYDRATE 18 MCG IN CAPS
18.0000 ug | ORAL_CAPSULE | Freq: Every day | RESPIRATORY_TRACT | Status: DC
  Administered 2014-10-09 – 2014-10-11 (×3): 18 ug via RESPIRATORY_TRACT
  Filled 2014-10-08 (×2): qty 5

## 2014-10-08 MED ORDER — PANTOPRAZOLE SODIUM 40 MG PO TBEC
40.0000 mg | DELAYED_RELEASE_TABLET | Freq: Every day | ORAL | Status: DC
  Administered 2014-10-08 – 2014-10-11 (×4): 40 mg via ORAL
  Filled 2014-10-08 (×4): qty 1

## 2014-10-08 MED ORDER — DOCUSATE SODIUM 100 MG PO CAPS
100.0000 mg | ORAL_CAPSULE | Freq: Two times a day (BID) | ORAL | Status: DC
  Administered 2014-10-08 – 2014-10-10 (×5): 100 mg via ORAL
  Filled 2014-10-08 (×6): qty 1

## 2014-10-08 MED ORDER — LORAZEPAM 0.5 MG PO TABS: 0.5000 mg | ORAL_TABLET | ORAL | Status: DC | PRN

## 2014-10-08 MED ORDER — MOMETASONE FURO-FORMOTEROL FUM 100-5 MCG/ACT IN AERO
2.0000 | INHALATION_SPRAY | Freq: Two times a day (BID) | RESPIRATORY_TRACT | Status: DC
  Administered 2014-10-08 – 2014-10-11 (×6): 2 via RESPIRATORY_TRACT
  Filled 2014-10-08: qty 8.8

## 2014-10-08 MED ORDER — INSULIN ASPART 100 UNIT/ML ~~LOC~~ SOLN
0.0000 [IU] | Freq: Three times a day (TID) | SUBCUTANEOUS | Status: DC
  Administered 2014-10-08: 2 [IU] via SUBCUTANEOUS
  Administered 2014-10-09: 3 [IU] via SUBCUTANEOUS
  Administered 2014-10-09: 2 [IU] via SUBCUTANEOUS
  Administered 2014-10-09: 5 [IU] via SUBCUTANEOUS
  Administered 2014-10-10 (×2): 2 [IU] via SUBCUTANEOUS
  Administered 2014-10-10: 3 [IU] via SUBCUTANEOUS
  Administered 2014-10-11: 1 [IU] via SUBCUTANEOUS
  Filled 2014-10-08: qty 2
  Filled 2014-10-08 (×2): qty 3
  Filled 2014-10-08 (×2): qty 2
  Filled 2014-10-08: qty 3
  Filled 2014-10-08: qty 2
  Filled 2014-10-08: qty 1
  Filled 2014-10-08: qty 5

## 2014-10-08 MED ORDER — ONDANSETRON HCL 4 MG PO TABS: 4.0000 mg | ORAL_TABLET | Freq: Four times a day (QID) | ORAL | Status: DC | PRN

## 2014-10-08 MED ORDER — DEXTROSE 5 % IV SOLN
1.0000 g | INTRAVENOUS | Status: DC
  Administered 2014-10-08 – 2014-10-10 (×3): 1 g via INTRAVENOUS
  Filled 2014-10-08 (×4): qty 10

## 2014-10-08 MED ORDER — POTASSIUM CHLORIDE CRYS ER 10 MEQ PO TBCR
10.0000 meq | EXTENDED_RELEASE_TABLET | Freq: Once | ORAL | Status: AC
  Administered 2014-10-08: 10 meq via ORAL

## 2014-10-08 MED ORDER — HEPARIN SODIUM (PORCINE) 5000 UNIT/ML IJ SOLN
5000.0000 [IU] | Freq: Three times a day (TID) | INTRAMUSCULAR | Status: DC
  Administered 2014-10-08 – 2014-10-11 (×9): 5000 [IU] via SUBCUTANEOUS
  Filled 2014-10-08 (×9): qty 1

## 2014-10-08 NOTE — ED Notes (Signed)
Pt wth low O2 sats at 90% bilateral wheezes.

## 2014-10-08 NOTE — ED Provider Notes (Addendum)
CSN: [id number]     Arrival date & time 10/08/14  1132 History   First MD Initiated Contact with Patient 10/08/14 1156     Chief Complaint  Patient presents with  . Shortness of Breath   HPI  64 year old gentleman who presents about 2 weeks after seeing his PCP, Dr.Olmedo, for hemoptysis. Chest x-ray was obtained and reportedly demonstrated pneumonia; Z-Pak prescription was given. Patient followed up the next week with Dr. Raul Del, on August 1, and was started on prednisone. Since then, he has been feeling more short of breath and having more chest tightness, marked increase in exertional dyspnea, having difficulty getting to the bathroom due to dyspnea. Phlegm production seems to be improving. No fever. Does have fatigue and malaise. No change in leg swelling.  [facility] chart reviewed: CXR below  Procedure:  Two-view (frontal and lateral projections) chest.   Indication: R04.2 Hemoptysis, hemoptysis  Compare: No prior films are available for comparison.  Cardiac contour within normal limits. Atherosclerotic calcification thoracic aorta. Lungs are hyperinflated with flattening of the hemidiaphragms. There are small right effusion/pleural thickening blunting the right costophrenic angle. There are emphysematous changes. There are multiple scattered reticular opacities. There is a suggestion of some increased heterogeneous opacity in the mid lungs left greater than right. There is question of an irregular nodular opacity in the right suprahilar area. Minor degenerative changes are seen in the thoracic spine.  Conclusions: 1. Hyperinflation and emphysema suggesting chronic obstructive pulmonary disease. 2. Question midlung heterogeneous opacities which may be related to] bronchiectasis with superimposed infection. 3. Irregular right suprahilar opacity which could represent an irregular pulmonary nodule.  Recommend short-term radiographic follow-up in 3-4 weeks to  following treatment . Correlation with prior radiographs would be helpful.  Findings were discussed with Dr. Kym Groom at 1219 hours 09/27/2014  Electronically Signed by:  Tillman Abide, MD Electronically Signed on:  09/27/2014 12:32 PM   Past Medical History  Diagnosis Date  . COPD (chronic obstructive pulmonary disease)   . Diabetes mellitus without complication   . Coronary artery disease   . Hypertension    Past Surgical History  Procedure Laterality Date  . Lung biopsy Right     2006   Family History  Problem Relation Age of Onset  . Cancer Mother   . Heart attack Father   . Cancer Sister    History  Substance Use Topics  . Smoking status: Former Research scientist (life sciences)  . Smokeless tobacco: Never Used  . Alcohol Use: No    Review of Systems  All other systems reviewed and are negative.   Allergies  Review of patient's allergies indicates no known allergies.  Home Medications   Prior to Admission medications   Medication Sig Start Date End Date Taking? Authorizing Provider  albuterol-ipratropium (COMBIVENT) 18-103 MCG/ACT inhaler Inhale into the lungs every 4 (four) hours.    Historical Provider, MD  aspirin 81 MG tablet Take 81 mg by mouth daily.    Historical Provider, MD  Fluticasone-Salmeterol (ADVAIR) 250-50 MCG/DOSE AEPB Inhale 1 puff into the lungs 2 (two) times daily.    Historical Provider, MD  metFORMIN (GLUCOPHAGE) 500 MG tablet Take by mouth 2 (two) times daily with a meal.    Historical Provider, MD  metoprolol (LOPRESSOR) 50 MG tablet Take 50 mg by mouth 2 (two) times daily.    Historical Provider, MD  nitroGLYCERIN (NITROSTAT) 0.4 MG SL tablet Place 0.4 mg under the tongue every 5 (five) minutes as needed for chest pain.  Historical Provider, MD  pantoprazole (PROTONIX) 40 MG tablet Take 40 mg by mouth daily.    Historical Provider, MD  predniSONE (DELTASONE) 20 MG tablet Take 3 tablets (60 mg total) by mouth daily with breakfast. Patient not taking: Reported  on 08/03/2014 07/14/14   Hinda Kehr, MD  simvastatin (ZOCOR) 20 MG tablet Take 20 mg by mouth daily.    Historical Provider, MD  tiotropium (SPIRIVA) 18 MCG inhalation capsule Place 18 mcg into inhaler and inhale daily.    Historical Provider, MD   BP 138/60 mmHg  Pulse 99  Temp(Src) 99 F (37.2 C) (Tympanic)  Resp 26  Ht '5\' 7"'$  (1.702 m)  Wt 148 lb (67.132 kg)  BMI 23.17 kg/m2  SpO2 96% Physical Exam  Constitutional: He is oriented to person, place, and time. No distress.  Alert, nicely groomed Mild respiratory distress at rest, splinting  HENT:  Head: Atraumatic.  Eyes:  Conjugate gaze, no eye redness/drainage  Neck: Neck supple.  Cardiovascular: Regular rhythm.   Heart rate 110s  Pulmonary/Chest: He is in respiratory distress.  Coarse breath sounds with scattered wheezing, with reasonable air excursion and reasonably symmetric breath sounds. No rhonchi. Splinting and visibly increased respiratory effort. Able to speak in short phrases. Appears winded after changing position in bed  Abdominal: Soft. He exhibits no distension.  Musculoskeletal: Normal range of motion.  Trace bilateral lower extremity edema, symmetric  Neurological: He is alert and oriented to person, place, and time.  Skin: Skin is warm and dry.  Slightly pale Slightly dusky lips  Nursing note and vitals reviewed.   ED Course  Procedures    Results for orders placed or performed during the hospital encounter of 10/08/14  Comprehensive metabolic panel  Result Value Ref Range   Sodium 130 (L) 135 - 145 mmol/L   Potassium 4.1 3.5 - 5.1 mmol/L   Chloride 92 (L) 101 - 111 mmol/L   CO2 29 22 - 32 mmol/L   Glucose, Bld 183 (H) 65 - 99 mg/dL   BUN 14 6 - 20 mg/dL   Creatinine, Ser 0.70 0.61 - 1.24 mg/dL   Calcium 8.9 8.9 - 10.3 mg/dL   Total Protein 7.1 6.5 - 8.1 g/dL   Albumin 2.8 (L) 3.5 - 5.0 g/dL   AST 18 15 - 41 U/L   ALT 23 17 - 63 U/L   Alkaline Phosphatase 97 38 - 126 U/L   Total Bilirubin 0.4  0.3 - 1.2 mg/dL   GFR calc non Af Amer >60 >60 mL/min   GFR calc Af Amer >60 >60 mL/min   Anion gap 9 5 - 15  CBC with Differential  Result Value Ref Range   WBC 22.5 (H) 3.8 - 10.6 K/uL   RBC 4.70 4.40 - 5.90 MIL/uL   Hemoglobin 12.8 (L) 13.0 - 18.0 g/dL   HCT 38.5 (L) 40.0 - 52.0 %   MCV 81.9 80.0 - 100.0 fL   MCH 27.2 26.0 - 34.0 pg   MCHC 33.2 32.0 - 36.0 g/dL   RDW 14.3 11.5 - 14.5 %   Platelets 554 (H) 150 - 440 K/uL   Neutrophils Relative % 82 %   Neutro Abs 18.5 (H) 1.4 - 6.5 K/uL   Lymphocytes Relative 8 %   Lymphs Abs 1.8 1.0 - 3.6 K/uL   Monocytes Relative 10 %   Monocytes Absolute 2.2 (H) 0.2 - 1.0 K/uL   Eosinophils Relative 0 %   Eosinophils Absolute 0.0 0 - 0.7 K/uL  Basophils Relative 0 %   Basophils Absolute 0.0 0 - 0.1 K/uL   WBC Morphology WHITE COUNT CONFIRMED ON SMEAR    RBC Morphology MORPHOLOGY UNREMARKABLE    Smear Review MORPHOLOGY UNREMARKABLE    Medications  potassium chloride (K-DUR,KLOR-CON) CR tablet 10 mEq (10 mEq Oral Given 10/08/14 1231)  furosemide (LASIX) injection 40 mg (40 mg Intravenous Given 10/08/14 1241)  ipratropium-albuterol (DUONEB) 0.5-2.5 (3) MG/3ML nebulizer solution 3 mL (3 mLs Nebulization Given 10/08/14 1259) x 3  methylPREDNISolone sodium succinate (SOLU-MEDROL) 125 mg/2 mL injection 125 mg (125 mg Intravenous Given 10/08/14 1418)   Nasal cannula O2 applied at 2L, caused increasing coughing and post tussive emesis; patient does not want to wear it right now and is lucid/making sense.  No real improvement in respiratory status after the above maneuvers which included duoneb breathing treatments x 3.  MDM   1. Acute on chronic respiratory failure with hypoxia   2. Leukocytosis    Patient has failed outpatient management of exacerbation of chronic lung disease. Have spoken to Dr [facility] about him.  Due to bed situation, will move patient to [facility] ED by squad, and Dr Doy Hutching will see him there. Spoke with ED charge  nurse [place].     Sherlene Shams, MD 10/08/14 [place], MD 11/08/14 [phone]

## 2014-10-08 NOTE — H&P (Signed)
History and Physical    Bradley [name]:[id number] DOB: 1950/04/14 DOA: 10/08/2014  Referring physician: Dr. Valere Dross PCP: Valera Castle, MD  Specialists: Dr. Raul Del  Chief Complaint: SOB  HPI: Bradley [name] is a 64 y.o. male has a past medical history significant for COPD and recurrent URI's now with worsening cough and SOB. Finished a Z-pak 10 days ago. Still with cough and SOB. No fever. Was seen in [facility] and given IV steroids and SVN's with no improvement. He is now admitted for further evaluation.  Review of Systems: The patient denies anorexia, fever, weight loss,, vision loss, decreased hearing, hoarseness, chest pain, syncope, peripheral edema, balance deficits, hemoptysis, abdominal pain, melena, hematochezia, severe indigestion/heartburn, hematuria, incontinence, genital sores, muscle weakness, suspicious skin lesions, transient blindness, difficulty walking, depression, unusual weight change, abnormal bleeding, enlarged lymph nodes, angioedema, and breast masses.   Past Medical History  Diagnosis Date  . COPD (chronic obstructive pulmonary disease)   . Diabetes mellitus without complication   . Coronary artery disease   . Hypertension    Past Surgical History  Procedure Laterality Date  . Lung biopsy Right     2006   Social History:  reports that he has quit smoking. He has never used smokeless tobacco. He reports that he does not drink alcohol or use illicit drugs.  No Known Allergies  Family History  Problem Relation Age of Onset  . Cancer Mother   . Heart attack Father   . Cancer Sister     Prior to Admission medications   Medication Sig Start Date End Date Taking? Authorizing Provider  albuterol-ipratropium (COMBIVENT) 18-103 MCG/ACT inhaler Inhale into the lungs every 4 (four) hours.    Historical Provider, MD  aspirin 81 MG tablet Take 81 mg by mouth daily.    Historical Provider, MD  Fluticasone-Salmeterol (ADVAIR) 250-50 MCG/DOSE AEPB Inhale 1  puff into the lungs 2 (two) times daily.    Historical Provider, MD  metFORMIN (GLUCOPHAGE) 500 MG tablet Take by mouth 2 (two) times daily with a meal.    Historical Provider, MD  metoprolol (LOPRESSOR) 50 MG tablet Take 50 mg by mouth 2 (two) times daily.    Historical Provider, MD  nitroGLYCERIN (NITROSTAT) 0.4 MG SL tablet Place 0.4 mg under the tongue every 5 (five) minutes as needed for chest pain.    Historical Provider, MD  pantoprazole (PROTONIX) 40 MG tablet Take 40 mg by mouth daily.    Historical Provider, MD  predniSONE (DELTASONE) 20 MG tablet Take 3 tablets (60 mg total) by mouth daily with breakfast. Patient not taking: Reported on 08/03/2014 07/14/14   Hinda Kehr, MD  simvastatin (ZOCOR) 20 MG tablet Take 20 mg by mouth daily.    Historical Provider, MD  tiotropium (SPIRIVA) 18 MCG inhalation capsule Place 18 mcg into inhaler and inhale daily.    Historical Provider, MD   Physical Exam: Filed Vitals:   10/08/14 1519  BP: 131/60  Pulse: 113  Temp: 98.6 F (37 C)  TempSrc: Oral  Resp: 26  SpO2: 92%     General:  No apparent distress  Eyes: PERRL, EOMI, no scleral icterus  ENT: moist oropharynx  Neck: supple, no lymphadenopathy  Cardiovascular: regular rate without MRG; 2+ peripheral pulses, no JVD, no peripheral edema  Respiratory: diffuse rhonchi with expiratory wheezes, tachypneic  Abdomen: soft, non tender to palpation, positive bowel sounds, no guarding, no rebound  Skin: no rashes  Musculoskeletal: normal bulk and tone, no joint swelling  Psychiatric: normal mood and affect  Neurologic: CN 2-12 grossly intact, MS 5/5 in all 4  Labs on Admission:  Basic Metabolic Panel:  Recent Labs Lab 10/08/14 1237  NA 130*  K 4.1  CL 92*  CO2 29  GLUCOSE 183*  BUN 14  CREATININE 0.70  CALCIUM 8.9   Liver Function Tests:  Recent Labs Lab 10/08/14 1237  AST 18  ALT 23  ALKPHOS 97  BILITOT 0.4  PROT 7.1  ALBUMIN 2.8*   No results for input(s):  LIPASE, AMYLASE in the last 168 hours. No results for input(s): AMMONIA in the last 168 hours. CBC:  Recent Labs Lab 10/08/14 1237  WBC 22.5*  NEUTROABS 18.5*  HGB 12.8*  HCT 38.5*  MCV 81.9  PLT 554*   Cardiac Enzymes: No results for input(s): CKTOTAL, CKMB, CKMBINDEX, TROPONINI in the last 168 hours.  BNP (last 3 results) No results for input(s): BNP in the last 8760 hours.  ProBNP (last 3 results) No results for input(s): PROBNP in the last 8760 hours.  CBG: No results for input(s): GLUCAP in the last 168 hours.  Radiological Exams on Admission: No results found.  EKG: Independently reviewed.  Assessment/Plan Principal Problem:   CAP (community acquired pneumonia) Active Problems:   COPD exacerbation   Will admit to floor with O2, IV ABX, IV steroids, and SVN's. Order chest CT. Consult Pulmonology. Repeat labs in AM.  Diet: regular Fluids: NS'@75'$  DVT Prophylaxis: SQ Heparin  Code Status: FULL  Family Communication: no  Disposition Plan: home  Time spent: 50 min

## 2014-10-08 NOTE — ED Notes (Signed)
Patient diagnosed with pneumonia this past week. On Abx but feels getting worse. Hard to catch his breath, weak and tired.

## 2014-10-08 NOTE — ED Notes (Signed)
Ambulance called for transport. Theadora Rama Charge nurse aware of patient coming to [facility] ED

## 2014-10-09 DIAGNOSIS — K219 Gastro-esophageal reflux disease without esophagitis: Secondary | ICD-10-CM | POA: Diagnosis present

## 2014-10-09 DIAGNOSIS — E1165 Type 2 diabetes mellitus with hyperglycemia: Secondary | ICD-10-CM | POA: Diagnosis present

## 2014-10-09 DIAGNOSIS — Z7952 Long term (current) use of systemic steroids: Secondary | ICD-10-CM | POA: Diagnosis not present

## 2014-10-09 DIAGNOSIS — Z87891 Personal history of nicotine dependence: Secondary | ICD-10-CM | POA: Diagnosis not present

## 2014-10-09 DIAGNOSIS — Z7982 Long term (current) use of aspirin: Secondary | ICD-10-CM | POA: Diagnosis not present

## 2014-10-09 DIAGNOSIS — J441 Chronic obstructive pulmonary disease with (acute) exacerbation: Secondary | ICD-10-CM | POA: Diagnosis present

## 2014-10-09 DIAGNOSIS — R918 Other nonspecific abnormal finding of lung field: Secondary | ICD-10-CM | POA: Diagnosis present

## 2014-10-09 DIAGNOSIS — J189 Pneumonia, unspecified organism: Secondary | ICD-10-CM | POA: Diagnosis present

## 2014-10-09 DIAGNOSIS — Z79899 Other long term (current) drug therapy: Secondary | ICD-10-CM | POA: Diagnosis not present

## 2014-10-09 DIAGNOSIS — I10 Essential (primary) hypertension: Secondary | ICD-10-CM | POA: Diagnosis present

## 2014-10-09 DIAGNOSIS — A419 Sepsis, unspecified organism: Secondary | ICD-10-CM | POA: Diagnosis present

## 2014-10-09 DIAGNOSIS — R590 Localized enlarged lymph nodes: Secondary | ICD-10-CM | POA: Diagnosis present

## 2014-10-09 DIAGNOSIS — E785 Hyperlipidemia, unspecified: Secondary | ICD-10-CM | POA: Diagnosis present

## 2014-10-09 DIAGNOSIS — T380X5A Adverse effect of glucocorticoids and synthetic analogues, initial encounter: Secondary | ICD-10-CM | POA: Diagnosis present

## 2014-10-09 DIAGNOSIS — I2510 Atherosclerotic heart disease of native coronary artery without angina pectoris: Secondary | ICD-10-CM | POA: Diagnosis present

## 2014-10-09 LAB — CBC
HEMATOCRIT: 34.5 % — AB (ref 40.0–52.0)
Hemoglobin: 11.7 g/dL — ABNORMAL LOW (ref 13.0–18.0)
MCH: 27.6 pg (ref 26.0–34.0)
MCHC: 33.9 g/dL (ref 32.0–36.0)
MCV: 81.2 fL (ref 80.0–100.0)
Platelets: 517 10*3/uL — ABNORMAL HIGH (ref 150–440)
RBC: 4.25 MIL/uL — ABNORMAL LOW (ref 4.40–5.90)
RDW: 14.7 % — ABNORMAL HIGH (ref 11.5–14.5)
WBC: 18.9 10*3/uL — ABNORMAL HIGH (ref 3.8–10.6)

## 2014-10-09 LAB — GLUCOSE, CAPILLARY
GLUCOSE-CAPILLARY: 182 mg/dL — AB (ref 65–99)
Glucose-Capillary: 222 mg/dL — ABNORMAL HIGH (ref 65–99)
Glucose-Capillary: 222 mg/dL — ABNORMAL HIGH (ref 65–99)
Glucose-Capillary: 270 mg/dL — ABNORMAL HIGH (ref 65–99)
Glucose-Capillary: 273 mg/dL — ABNORMAL HIGH (ref 65–99)

## 2014-10-09 LAB — COMPREHENSIVE METABOLIC PANEL
ALT: 19 U/L (ref 17–63)
ANION GAP: 9 (ref 5–15)
AST: 11 U/L — ABNORMAL LOW (ref 15–41)
Albumin: 2.4 g/dL — ABNORMAL LOW (ref 3.5–5.0)
Alkaline Phosphatase: 84 U/L (ref 38–126)
BUN: 16 mg/dL (ref 6–20)
CO2: 25 mmol/L (ref 22–32)
Calcium: 8.6 mg/dL — ABNORMAL LOW (ref 8.9–10.3)
Chloride: 97 mmol/L — ABNORMAL LOW (ref 101–111)
Creatinine, Ser: 0.47 mg/dL — ABNORMAL LOW (ref 0.61–1.24)
GFR calc Af Amer: >60 mL/min (ref >60)
Glucose, Bld: 230 mg/dL — ABNORMAL HIGH (ref 65–99)
Potassium: 4.2 mmol/L (ref 3.5–5.1)
Sodium: 131 mmol/L — ABNORMAL LOW (ref 135–145)
TOTAL PROTEIN: 6.2 g/dL — AB (ref 6.5–8.1)

## 2014-10-09 MED ORDER — IPRATROPIUM-ALBUTEROL 0.5-2.5 (3) MG/3ML IN SOLN
RESPIRATORY_TRACT | Status: AC
Start: 2014-10-09 — End: 2014-10-09
  Filled 2014-10-09: qty 3

## 2014-10-09 MED ORDER — INSULIN GLARGINE 100 UNIT/ML ~~LOC~~ SOLN
6.0000 [IU] | Freq: Every day | SUBCUTANEOUS | Status: DC
  Administered 2014-10-09 – 2014-10-10 (×2): 6 [IU] via SUBCUTANEOUS
  Filled 2014-10-09 (×3): qty 0.06

## 2014-10-09 MED ORDER — METHYLPREDNISOLONE SODIUM SUCC 40 MG IJ SOLR
40.0000 mg | Freq: Three times a day (TID) | INTRAMUSCULAR | Status: DC
  Administered 2014-10-09 – 2014-10-11 (×6): 40 mg via INTRAVENOUS
  Filled 2014-10-09 (×6): qty 1

## 2014-10-09 MED ORDER — IPRATROPIUM-ALBUTEROL 0.5-2.5 (3) MG/3ML IN SOLN
3.0000 mL | Freq: Once | RESPIRATORY_TRACT | Status: AC
  Administered 2014-10-09: 3 mL via RESPIRATORY_TRACT

## 2014-10-09 NOTE — Progress Notes (Signed)
Date: 10/09/2014,   MRN# [id number] Bradley [name] Oct 24, 1950 Code Status:     Code Status Orders        Start     Ordered   10/08/14 1549  Full code   Continuous     10/08/14 1548     Hosp day:'@LENGTHOFSTAYDAYS'$ @ Referring MD: '@ATDPROV'$ @     PCP:      AdmissionWeight: 148 lb (67.132 kg)                  CurrentWeight: 140 lb 14.4 oz (63.912 kg)   HPI: Increase dyspnea on exertion  PMHX:  This is an ex smoker, known to my service here with increase sob, chest tightness and wheezing. He is known to have severe COPD and recurrent URI's.  Finished a Z-pak 10 days ago. Still with cough and SOB. No fever. Was seen in [facility] and given IV steroids and SVN's with no improvement. He is now admitted for further evaluation and treaternt. He denies calf pain, leg edema, chest pain, syncope or ectopy. No recent travel. No else home at home or contacts are sick. No nodes rashes.    Past Medical History  Diagnosis Date  . COPD (chronic obstructive pulmonary disease)   . Diabetes mellitus without complication   . Coronary artery disease   . Hypertension    Surgical Hx:  Past Surgical History  Procedure Laterality Date  . Lung biopsy Right     2006   Family Hx:  Family History  Problem Relation Age of Onset  . Cancer Mother   . Heart attack Father   . Cancer Sister    Social Hx:   History  Substance Use Topics  . Smoking status: Former Research scientist (life sciences)  . Smokeless tobacco: Never Used  . Alcohol Use: No   Medication:    Home Medication:  No current outpatient prescriptions on file.  Current Medication: '@CURMEDTAB'$ @   Allergies:  Review of patient's allergies indicates no known allergies.  Review of Systems: Gen:  Denies  fever, sweats, chills HEENT: Denies blurred vision, double vision, ear pain, eye pain, hearing loss, nose bleeds, sore throat Cvc:  No dizziness, chest pain or heaviness Resp:   Dyspnea on exertion, no pleurisy, no hemoptysis, cough present Gi: Denies swallowing  difficulty, stomach pain, nausea or vomiting, diarrhea, constipation, bowel incontinence Gu:  Denies bladder incontinence, burning urine Ext:   No Joint pain, stiffness or swelling Skin: No skin rash, easy bruising or bleeding or hives Endoc:  No polyuria, polydipsia , polyphagia or weight change Psych: No depression, insomnia or hallucinations  Other:  All other systems negative  Physical Examination:   VS: BP 117/69 mmHg  Pulse 98  Temp(Src) 97.7 F (36.5 C) (Oral)  Resp 20  Ht '5\' 7"'$  (1.702 m)  Wt 140 lb 14.4 oz (63.912 kg)  BMI 22.06 kg/m2  SpO2 89%  General Appearance: No distress, no distress  Neuro: without focal findings, mental status, speech normal, alert and oriented, cranial nerves 2-12 intact, reflexes normal and symmetric, sensation grossly normal  HEENT: PERRLA, EOM intact, no ptosis, no other lesions noticed, Mallampati: Pulmonary:.No wheezing, No rales  Sputum Production:   Cardiovascular:  Normal S1,S2.  No m/r/g.  Abdominal aorta pulsation normal.    Abdomen:Benign, Soft, non-tender, No masses, hepatosplenomegaly, No lymphadenopathy Endoc: No evident thyromegaly, no signs of acromegaly or Cushing features Skin:   warm, no rashes, no ecchymosis  Extremities: normal, no cyanosis, clubbing, no edema, warm with normal capillary refill. Other  findings:   Labs results:   Recent Labs     10/08/14  1237  10/09/14  0549  HGB  12.8*  11.7*  HCT  38.5*  34.5*  MCV  81.9  81.2  WBC  22.5*  18.9*  BUN  14  16  CREATININE  0.70  0.47*  GLUCOSE  183*  230*  CALCIUM  8.9  8.6*  ,        Rad results:   Ct Chest Wo Contrast  10/08/2014   CLINICAL DATA:  Worsening cough and shortness of breath. Recent antibiotic therapy for COPD exacerbation/upper respiratory infection. Ex-smoker with history of right upper lobe tumor. Initial encounter.  EXAM: CT CHEST WITHOUT CONTRAST  TECHNIQUE: Multidetector CT imaging of the chest was performed following the standard protocol  without IV contrast.  COMPARISON:  Radiographs 07/14/2014.  Chest CT 08/18/2012.  FINDINGS: Mediastinum/Nodes: Small mediastinal lymph nodes have enlarged compared with the prior study, including a 10 mm pre-vascular node on image 26, a 10 mm precarinal node on image 30 and a probable 16 mm right hilar node on image 33. Hilar evaluation is limited by the lack of intravenous contrast. There is no axillary or supraclavicular adenopathy. The thyroid gland, trachea and esophagus demonstrate no significant findings. The heart size is normal. There is no pericardial effusion. There is diffuse atherosclerosis of the aorta, great vessels and coronary arteries. There is an apparent left carotid stent.  Lungs/Pleura: There is chronic pleural thickening on the right. No significant pleural effusion. There is severe chronic lung disease with advanced emphysema and chronic volume loss in the right lower lobe status post partial lung resection. Compared with the prior CT and more recent radiographs, there are new multifocal airspace opacities or ill-defined masses within the right middle and lower lobes, including a 3.8 x 6.3 cm component on image 40, a 4.0 x 2.6 cm paraspinal component on image 55 and a 4.9 x 2.9 cm component superior to the diaphragm on image 53. There are surrounding ground-glass opacities, and based on the appearance on the reformatted images, an inflammatory process is favored. There is a 6 mm right lower lobe nodule on image 47. 5 mm nodular density at the left lung base on image 50 is unchanged. There is no airspace disease in the left lung.  Upper abdomen:  Unremarkable.  There is no adrenal mass.  Musculoskeletal/Chest wall: There is no chest wall mass or suspicious osseous finding.  IMPRESSION: 1. Multifocal airspace opacities or ill-defined opacities throughout the right lung status post partial right lower lobe resection. Appearance favors multifocal pneumonia, although neoplasm cannot be completely  excluded, especially given the reported history of tumor. Followup PA and lateral chest X-Bradley is recommended in 3-4 weeks following trial of antibiotic therapy to ensure resolution and exclude underlying malignancy. Ultimately, this may require CT follow-up as well. 2. Underlying severe chronic lung disease with diffuse emphysema. 3. Nonspecific mild enlargement of mediastinal and right hilar lymph nodes, potentially reactive. 4. Diffuse atherosclerosis.   Electronically Signed   By: Richardean Sale M.D.   On: 10/08/2014 16:55      EKG:     Other:   Assessment and Plan: Severe copd here with possible pneumonia, ex smoker, no signs of failure -rocephin/zithro is good -copd regimen as is -02 with activity -if possible sputum culture  Stable known bilateral subcentimeter nodules are unchanged -following  Mediastinal adenopathy ? Reactive  Per above following    I have personally obtained a history,  examined the patient, evaluated laboratory and imaging results, formulated the assessment and plan and placed orders.  The Patient requires high complexity decision making for assessment and support, frequent evaluation and titration of therapies, application of advanced monitoring technologies and extensive interpretation of multiple databases.   Tyleah Loh,M.D. Pulmonary & Critical care Medicine [facility]

## 2014-10-09 NOTE — Progress Notes (Signed)
PT Cancellation Note  Patient Details Name: CHANANYA CANIZALEZ MRN: [id number] DOB: 03/21/1950   Cancelled Treatment:    Reason Eval/Treat Not Completed: Medical issues which prohibited therapy;Other (comment) (Pt declined for extreme SOB but not on O2).  Asked nursing if this could be arranged, and will check on with doctor.   Ramond Dial 10/09/2014, 3:38 PM   Mee Hives, PT MS Acute Rehab Dept. Number: [facility] [id number] and [place] [phone]

## 2014-10-09 NOTE — Progress Notes (Signed)
Patient ID: Bradley [name], male   DOB: May 30, 1950, 64 y.o.   MRN: [id number] [facility] PROGRESS NOTE  PCP: Valera Castle, MD  HPI/Subjective: Patient with shortness of breath with talking and moving around. Patient feels okay at rest and just sitting. Coughing up phlegm.  Objective: Filed Vitals:   10/09/14 0828  BP: 117/69  Pulse: 98  Temp: 97.7 F (36.5 C)  Resp: 20    Intake/Output Summary (Last 24 hours) at 10/09/14 1203 Last data filed at 10/09/14 0900  Gross per 24 hour  Intake   1292 ml  Output    300 ml  Net    992 ml   Filed Weights   10/08/14 1700 10/09/14 0723  Weight: 67.132 kg (148 lb) 63.912 kg (140 lb 14.4 oz)    ROS: Review of Systems  Constitutional: Negative for fever and chills.  Eyes: Negative for blurred vision.  Respiratory: Positive for cough and shortness of breath.   Cardiovascular: Negative for chest pain.  Gastrointestinal: Negative for nausea, vomiting, abdominal pain, diarrhea and constipation.  Genitourinary: Negative for dysuria.  Musculoskeletal: Negative for joint pain.  Neurological: Negative for dizziness and headaches.   Exam: Physical Exam  Constitutional: He is oriented to person, place, and time.  HENT:  Nose: No mucosal edema.  Mouth/Throat: No oropharyngeal exudate or posterior oropharyngeal edema.  Eyes: Conjunctivae, EOM and lids are normal. Pupils are equal, round, and reactive to light.  Neck: No JVD present. Carotid bruit is not present. No edema present. No thyroid mass and no thyromegaly present.  Cardiovascular: S1 normal and S2 normal.  Exam reveals no gallop.   No murmur heard. Pulses:      Dorsalis pedis pulses are 2+ on the right side, and 2+ on the left side.  Respiratory: No respiratory distress. He has decreased breath sounds in the right lower field and the left lower field. He has wheezes in the right lower field. He has no rhonchi. He has no rales.  GI: Soft. Bowel sounds are normal.  There is no tenderness.  Musculoskeletal:       Right ankle: He exhibits no swelling.       Left ankle: He exhibits no swelling.  Lymphadenopathy:    He has no cervical adenopathy.  Neurological: He is alert and oriented to person, place, and time. No cranial nerve deficit.  Skin: Skin is warm. No rash noted. Nails show no clubbing.  Psychiatric: He has a normal mood and affect.    Data Reviewed: Basic Metabolic Panel:  Recent Labs Lab 10/08/14 1237 10/09/14 0549  NA 130* 131*  K 4.1 4.2  CL 92* 97*  CO2 29 25  GLUCOSE 183* 230*  BUN 14 16  CREATININE 0.70 0.47*  CALCIUM 8.9 8.6*   Liver Function Tests:  Recent Labs Lab 10/08/14 1237 10/09/14 0549  AST 18 11*  ALT 23 19  ALKPHOS 97 84  BILITOT 0.4 <0.1*  PROT 7.1 6.2*  ALBUMIN 2.8* 2.4*   CBC:  Recent Labs Lab 10/08/14 1237 10/09/14 0549  WBC 22.5* 18.9*  NEUTROABS 18.5*  --   HGB 12.8* 11.7*  HCT 38.5* 34.5*  MCV 81.9 81.2  PLT 554* 517*   CBG:  Recent Labs Lab 10/08/14 1658 10/08/14 2135 10/09/14 0800 10/09/14 0831 10/09/14 1140  GLUCAP 186* 250* 222* 222* 270*    Studies: Ct Chest Wo Contrast  10/08/2014   CLINICAL DATA:  Worsening cough and shortness of breath. Recent antibiotic therapy for COPD  exacerbation/upper respiratory infection. Ex-smoker with history of right upper lobe tumor. Initial encounter.  EXAM: CT CHEST WITHOUT CONTRAST  TECHNIQUE: Multidetector CT imaging of the chest was performed following the standard protocol without IV contrast.  COMPARISON:  Radiographs 07/14/2014.  Chest CT 08/18/2012.  FINDINGS: Mediastinum/Nodes: Small mediastinal lymph nodes have enlarged compared with the prior study, including a 10 mm pre-vascular node on image 26, a 10 mm precarinal node on image 30 and a probable 16 mm right hilar node on image 33. Hilar evaluation is limited by the lack of intravenous contrast. There is no axillary or supraclavicular adenopathy. The thyroid gland, trachea and  esophagus demonstrate no significant findings. The heart size is normal. There is no pericardial effusion. There is diffuse atherosclerosis of the aorta, great vessels and coronary arteries. There is an apparent left carotid stent.  Lungs/Pleura: There is chronic pleural thickening on the right. No significant pleural effusion. There is severe chronic lung disease with advanced emphysema and chronic volume loss in the right lower lobe status post partial lung resection. Compared with the prior CT and more recent radiographs, there are new multifocal airspace opacities or ill-defined masses within the right middle and lower lobes, including a 3.8 x 6.3 cm component on image 40, a 4.0 x 2.6 cm paraspinal component on image 55 and a 4.9 x 2.9 cm component superior to the diaphragm on image 53. There are surrounding ground-glass opacities, and based on the appearance on the reformatted images, an inflammatory process is favored. There is a 6 mm right lower lobe nodule on image 47. 5 mm nodular density at the left lung base on image 50 is unchanged. There is no airspace disease in the left lung.  Upper abdomen:  Unremarkable.  There is no adrenal mass.  Musculoskeletal/Chest wall: There is no chest wall mass or suspicious osseous finding.  IMPRESSION: 1. Multifocal airspace opacities or ill-defined opacities throughout the right lung status post partial right lower lobe resection. Appearance favors multifocal pneumonia, although neoplasm cannot be completely excluded, especially given the reported history of tumor. Followup PA and lateral chest X-ray is recommended in 3-4 weeks following trial of antibiotic therapy to ensure resolution and exclude underlying malignancy. Ultimately, this may require CT follow-up as well. 2. Underlying severe chronic lung disease with diffuse emphysema. 3. Nonspecific mild enlargement of mediastinal and right hilar lymph nodes, potentially reactive. 4. Diffuse atherosclerosis.    Electronically Signed   By: Richardean Sale M.D.   On: 10/08/2014 16:55    Scheduled Meds: . aspirin EC  81 mg Oral Daily  . azithromycin  500 mg Intravenous Q24H  . cefTRIAXone (ROCEPHIN)  IV  1 g Intravenous Q24H  . docusate sodium  100 mg Oral BID  . heparin  5,000 Units Subcutaneous 3 times per day  . insulin aspart  0-9 Units Subcutaneous TID WC  . ipratropium-albuterol  3 mL Nebulization QID  . metFORMIN  500 mg Oral BID WC  . methylPREDNISolone (SOLU-MEDROL) injection  40 mg Intravenous 3 times per day  . metoprolol  50 mg Oral BID  . mometasone-formoterol  2 puff Inhalation BID  . pantoprazole  40 mg Oral Daily  . simvastatin  20 mg Oral Daily  . tiotropium  18 mcg Inhalation Daily    Assessment/Plan:  1. Clinical sepsis present on admission with multi lobar pneumonia bilaterally, leukocytosis and tachycardia. Continue Rocephin and Zithromax. Order sputum culture. 2.   COPD exacerbation- decrease steroids to 40 mg every 8 hours.  3.   Type 2 diabetes mellitus- sugars will be elevated while on steroids. Add low-dose Lantus tonight. 4.    Hyperlipidemia unspecified continue simvastatin 5.    Gastroesophageal reflux disease without esophagitis on Protonix as outpatient will continue while here. 6.    Essential hypertension on metoprolol.  Code Status:     Code Status Orders        Start     Ordered   10/08/14 1549  Full code   Continuous     10/08/14 1548     Disposition Plan: Home once better  Antibiotics:  Rocephin and Zithromax  Time spent: 25 minutes  Loletha Grayer  [facility]

## 2014-10-10 LAB — GLUCOSE, CAPILLARY
GLUCOSE-CAPILLARY: 197 mg/dL — AB (ref 65–99)
Glucose-Capillary: 192 mg/dL — ABNORMAL HIGH (ref 65–99)
Glucose-Capillary: 244 mg/dL — ABNORMAL HIGH (ref 65–99)
Glucose-Capillary: 193 mg/dL — ABNORMAL HIGH (ref 65–99)

## 2014-10-10 LAB — BASIC METABOLIC PANEL
Anion gap: 10 (ref 5–15)
BUN: 17 mg/dL (ref 6–20)
CALCIUM: 8.7 mg/dL — AB (ref 8.9–10.3)
CHLORIDE: 97 mmol/L — AB (ref 101–111)
CO2: 25 mmol/L (ref 22–32)
CREATININE: 0.52 mg/dL — AB (ref 0.61–1.24)
GFR calc Af Amer: >60 mL/min (ref >60)
GFR calc non Af Amer: >60 mL/min (ref >60)
GLUCOSE: 213 mg/dL — AB (ref 65–99)
Potassium: 4.4 mmol/L (ref 3.5–5.1)
Sodium: 132 mmol/L — ABNORMAL LOW (ref 135–145)

## 2014-10-10 LAB — EXPECTORATED SPUTUM ASSESSMENT W REFEX TO RESP CULTURE

## 2014-10-10 NOTE — Progress Notes (Signed)
Date: 10/10/2014,   MRN# [id number] Bradley [name] [birth year] Code Status:     Code Status Orders        Start     Ordered   10/08/14 1549  Full code   Continuous     10/08/14 Octavia day:'@LENGTHOFSTAYDAYS'$ @ Referring MD: '@ATDPROV'$ @     PCP:      AdmissionWeight: 148 lb (67.132 kg)                 CurrentWeight: 142 lb 8 oz (64.638 kg)  HPI: Sitting up in bed, comfortable, dyspnea though with activity.   PMHX:   Past Medical History  Diagnosis Date  . COPD (chronic obstructive pulmonary disease)   . Diabetes mellitus without complication   . Coronary artery disease   . Hypertension    Surgical Hx:  Past Surgical History  Procedure Laterality Date  . Lung biopsy Right     2006   Family Hx:  Family History  Problem Relation Age of Onset  . Cancer Mother   . Heart attack Father   . Cancer Sister    Social Hx:   History  Substance Use Topics  . Smoking status: Former Research scientist (life sciences)  . Smokeless tobacco: Never Used  . Alcohol Use: No   Medication:    Home Medication:  No current outpatient prescriptions on file.  Current Medication: '@CURMEDTAB'$ @   Allergies:  Review of patient's allergies indicates no known allergies.  Review of Systems: Gen:  Denies  fever, sweats, chills HEENT: Denies blurred vision, double vision, ear pain, eye pain, hearing loss, nose bleeds, sore throat Cvc:  No dizziness, chest pain or heaviness Resp:    Gi: Denies swallowing difficulty, stomach pain, nausea or vomiting, diarrhea, constipation, bowel incontinence Gu:  Denies bladder incontinence, burning urine Ext:   No Joint pain, stiffness or swelling Skin: No skin rash, easy bruising or bleeding or hives Endoc:  No polyuria, polydipsia , polyphagia or weight change Psych: No depression, insomnia or hallucinations  Other:  All other systems negative  Physical Examination:   VS: BP 131/83 mmHg  Pulse 98  Temp(Src) 97.7 F (36.5 C) (Oral)  Resp 22  Ht '5\' 7"'$  (1.702 m)  Wt 142  lb 8 oz (64.638 kg)  BMI 22.31 kg/m2  SpO2 93%  General Appearance: No distress  Neuro: without focal findings, mental status, speech normal, alert and oriented, cranial nerves 2-12 intact, reflexes normal and symmetric, sensation grossly normal  HEENT: PERRLA, EOM intact, no ptosis, no other lesions noticed, Mallampati: Pulmonary:.No wheezing, No rales  Sputum Production:   Cardiovascular:  Normal S1,S2.  No m/r/g.  Abdominal aorta pulsation normal.    Abdomen:Benign, Soft, non-tender, No masses, hepatosplenomegaly, No lymphadenopathy Endoc: No evident thyromegaly, no signs of acromegaly or Cushing features Skin:   warm, no rashes, no ecchymosis  Extremities: normal, no cyanosis, clubbing, no edema, warm with normal capillary refill. Other findings:   Labs results:   Recent Labs     10/08/14  1237  10/09/14  0549  10/10/14  0518  HGB  12.8*  11.7*   --   HCT  38.5*  34.5*   --   MCV  81.9  81.2   --   WBC  22.5*  18.9*   --   BUN  '14  16  17  '$ CREATININE  0.70  0.47*  0.52*  GLUCOSE  183*  230*  213*  CALCIUM  8.9  8.6*  8.7*  ,  Rad results:  TECHNIQUE: Multidetector CT imaging of the chest was performed following the standard protocol without IV contrast.  COMPARISON: Radiographs 07/14/2014. Chest CT 08/18/2012.  FINDINGS: Mediastinum/Nodes: Small mediastinal lymph nodes have enlarged compared with the prior study, including a 10 mm pre-vascular node on image 26, a 10 mm precarinal node on image 30 and a probable 16 mm right hilar node on image 33. Hilar evaluation is limited by the lack of intravenous contrast. There is no axillary or supraclavicular adenopathy. The thyroid gland, trachea and esophagus demonstrate no significant findings. The heart size is normal. There is no pericardial effusion. There is diffuse atherosclerosis of the aorta, great vessels and coronary arteries. There is an apparent left carotid stent.  Lungs/Pleura: There is chronic  pleural thickening on the right. No significant pleural effusion. There is severe chronic lung disease with advanced emphysema and chronic volume loss in the right lower lobe status post partial lung resection. Compared with the prior CT and more recent radiographs, there are new multifocal airspace opacities or ill-defined masses within the right middle and lower lobes, including a 3.8 x 6.3 cm component on image 40, a 4.0 x 2.6 cm paraspinal component on image 55 and a 4.9 x 2.9 cm component superior to the diaphragm on image 53. There are surrounding ground-glass opacities, and based on the appearance on the reformatted images, an inflammatory process is favored. There is a 6 mm right lower lobe nodule on image 47. 5 mm nodular density at the left lung base on image 50 is unchanged. There is no airspace disease in the left lung.  Upper abdomen: Unremarkable. There is no adrenal mass.  Musculoskeletal/Chest wall: There is no chest wall mass or suspicious osseous finding.  IMPRESSION: 1. Multifocal airspace opacities or ill-defined opacities throughout the right lung status post partial right lower lobe resection. Appearance favors multifocal pneumonia, although neoplasm cannot be completely excluded, especially given the reported history of tumor. Followup PA and lateral chest X-ray is recommended in 3-4 weeks following trial of antibiotic therapy to ensure resolution and exclude underlying malignancy. Ultimately, this may require CT follow-up as well. 2. Underlying severe chronic lung disease with diffuse emphysema. 3. Nonspecific mild enlargement of mediastinal and right hilar lymph nodes, potentially reactive. 4. Diffuse atherosclerosis.   Electronically Signed  By: Richardean Sale M.D.  On: 10/08/2014 16:55  Assessment and Plan:   Severe copd here with bilateral pneumonia, ex smoker, no signs of failure -rocephin/zithro is good -copd regimen as is PO'S in  am -02 with activity  Stable known bilateral subcentimeter nodules are unchanged -following  Mediastinal adenopathy ? Reactive  Per above  I have personally obtained a history, examined the patient, evaluated laboratory and imaging results, formulated the assessment and plan and placed orders.  The Patient requires high complexity decision making for assessment and support, frequent evaluation and titration of therapies, application of advanced monitoring technologies and extensive interpretation of multiple databases.   Herbon Fleming,M.D. Pulmonary & Critical care Medicine [facility]

## 2014-10-10 NOTE — Progress Notes (Signed)
Patient ID: Bradley [name], male   DOB: March 01, 1951, 64 y.o.   MRN: [id number] [facility] PROGRESS NOTE  PCP: Bradley Castle, MD  HPI/Subjective: Patient feels a little bit better than yesterday. Still with cough and shortness of breath. Whitish phlegm. Wheezing. When he walks around he gets more short of breath.  Objective: Filed Vitals:   10/10/14 0735  BP: 124/60  Pulse: 87  Temp: 97.7 F (36.5 C)  Resp: 22    Filed Weights   10/08/14 1700 10/09/14 0723 10/10/14 0500  Weight: 67.132 kg (148 lb) 63.912 kg (140 lb 14.4 oz) 64.638 kg (142 lb 8 oz)    ROS: Review of Systems  Constitutional: Negative for fever and chills.  Eyes: Negative for blurred vision.  Respiratory: Positive for cough, sputum production and shortness of breath.   Cardiovascular: Negative for chest pain.  Gastrointestinal: Negative for nausea, vomiting, abdominal pain, diarrhea and constipation.  Genitourinary: Negative for dysuria.  Musculoskeletal: Negative for joint pain.  Neurological: Negative for dizziness and headaches.   Exam: Physical Exam  Constitutional: He is oriented to person, place, and time.  HENT:  Nose: No mucosal edema.  Mouth/Throat: No oropharyngeal exudate or posterior oropharyngeal edema.  Eyes: Conjunctivae, EOM and lids are normal. Pupils are equal, round, and reactive to light.  Neck: No JVD present. Carotid bruit is not present. No edema present. No thyroid mass and no thyromegaly present.  Cardiovascular: S1 normal and S2 normal.  Exam reveals no gallop.   No murmur heard. Pulses:      Dorsalis pedis pulses are 2+ on the right side, and 2+ on the left side.  Respiratory: No respiratory distress. He has decreased breath sounds in the right lower field and the left lower field. He has wheezes in the right lower field. He has no rhonchi. He has no rales.  GI: Soft. Bowel sounds are normal. There is no tenderness.  Musculoskeletal:       Right ankle: He  exhibits no swelling.       Left ankle: He exhibits no swelling.  Lymphadenopathy:    He has no cervical adenopathy.  Neurological: He is alert and oriented to person, place, and time. No cranial nerve deficit.  Skin: Skin is warm. No rash noted. Nails show clubbing.  Psychiatric: He has a normal mood and affect.    Data Reviewed: Basic Metabolic Panel:  Recent Labs Lab 10/08/14 1237 10/09/14 0549 10/10/14 0518  NA 130* 131* 132*  K 4.1 4.2 4.4  CL 92* 97* 97*  CO2 '29 25 25  '$ GLUCOSE 183* 230* 213*  BUN '14 16 17  '$ CREATININE 0.70 0.47* 0.52*  CALCIUM 8.9 8.6* 8.7*   Liver Function Tests:  Recent Labs Lab 10/08/14 1237 10/09/14 0549  AST 18 11*  ALT 23 19  ALKPHOS 97 84  BILITOT 0.4 <0.1*  PROT 7.1 6.2*  ALBUMIN 2.8* 2.4*   CBC:  Recent Labs Lab 10/08/14 1237 10/09/14 0549  WBC 22.5* 18.9*  NEUTROABS 18.5*  --   HGB 12.8* 11.7*  HCT 38.5* 34.5*  MCV 81.9 81.2  PLT 554* 517*   CBG:  Recent Labs Lab 10/09/14 0831 10/09/14 1140 10/09/14 1640 10/09/14 2106 10/10/14 0732  GLUCAP 222* 270* 182* 273* 192*    Studies: Ct Chest Wo Contrast  10/08/2014   CLINICAL DATA:  Worsening cough and shortness of breath. Recent antibiotic therapy for COPD exacerbation/upper respiratory infection. Ex-smoker with history of right upper lobe tumor. Initial encounter.  EXAM:  CT CHEST WITHOUT CONTRAST  TECHNIQUE: Multidetector CT imaging of the chest was performed following the standard protocol without IV contrast.  COMPARISON:  Radiographs 07/14/2014.  Chest CT 08/18/2012.  FINDINGS: Mediastinum/Nodes: Small mediastinal lymph nodes have enlarged compared with the prior study, including a 10 mm pre-vascular node on image 26, a 10 mm precarinal node on image 30 and a probable 16 mm right hilar node on image 33. Hilar evaluation is limited by the lack of intravenous contrast. There is no axillary or supraclavicular adenopathy. The thyroid gland, trachea and esophagus demonstrate no  significant findings. The heart size is normal. There is no pericardial effusion. There is diffuse atherosclerosis of the aorta, great vessels and coronary arteries. There is an apparent left carotid stent.  Lungs/Pleura: There is chronic pleural thickening on the right. No significant pleural effusion. There is severe chronic lung disease with advanced emphysema and chronic volume loss in the right lower lobe status post partial lung resection. Compared with the prior CT and more recent radiographs, there are new multifocal airspace opacities or ill-defined masses within the right middle and lower lobes, including a 3.8 x 6.3 cm component on image 40, a 4.0 x 2.6 cm paraspinal component on image 55 and a 4.9 x 2.9 cm component superior to the diaphragm on image 53. There are surrounding ground-glass opacities, and based on the appearance on the reformatted images, an inflammatory process is favored. There is a 6 mm right lower lobe nodule on image 47. 5 mm nodular density at the left lung base on image 50 is unchanged. There is no airspace disease in the left lung.  Upper abdomen:  Unremarkable.  There is no adrenal mass.  Musculoskeletal/Chest wall: There is no chest wall mass or suspicious osseous finding.  IMPRESSION: 1. Multifocal airspace opacities or ill-defined opacities throughout the right lung status post partial right lower lobe resection. Appearance favors multifocal pneumonia, although neoplasm cannot be completely excluded, especially given the reported history of tumor. Followup PA and lateral chest X-ray is recommended in 3-4 weeks following trial of antibiotic therapy to ensure resolution and exclude underlying malignancy. Ultimately, this may require CT follow-up as well. 2. Underlying severe chronic lung disease with diffuse emphysema. 3. Nonspecific mild enlargement of mediastinal and right hilar lymph nodes, potentially reactive. 4. Diffuse atherosclerosis.   Electronically Signed   By: Richardean Sale M.D.   On: 10/08/2014 16:55    Scheduled Meds: . aspirin EC  81 mg Oral Daily  . azithromycin  500 mg Intravenous Q24H  . cefTRIAXone (ROCEPHIN)  IV  1 g Intravenous Q24H  . docusate sodium  100 mg Oral BID  . heparin  5,000 Units Subcutaneous 3 times per day  . insulin aspart  0-9 Units Subcutaneous TID WC  . insulin glargine  6 Units Subcutaneous QHS  . ipratropium-albuterol  3 mL Nebulization QID  . metFORMIN  500 mg Oral BID WC  . methylPREDNISolone (SOLU-MEDROL) injection  40 mg Intravenous 3 times per day  . metoprolol  50 mg Oral BID  . mometasone-formoterol  2 puff Inhalation BID  . pantoprazole  40 mg Oral Daily  . simvastatin  20 mg Oral Daily  . tiotropium  18 mcg Inhalation Daily    Assessment/Plan:  1. Clinical sepsis present on admission with multi lobar pneumonia bilaterally, leukocytosis and tachycardia. Continue Rocephin and Zithromax. Sputum culture. 2.   COPD exacerbation- continuesteroids to 40 mg every 8 hours.  3.   Type 2 diabetes mellitus- sugars  will be elevated while on steroids. Continue low-dose Lantus while on IV steroids.  4.    Hyperlipidemia unspecified continue simvastatin 5.    Gastroesophageal reflux disease without esophagitis on Protonix as outpatient will continue while here. 6.    Essential hypertension on metoprolol.  Code Status:     Code Status Orders        Start     Ordered   10/08/14 1549  Full code   Continuous     10/08/14 1548     Disposition Plan: Home once better, case discussed with family at bedside.  Antibiotics:  Rocephin and Zithromax  Time spent: 22 minutes  Loletha Grayer  [facility]

## 2014-10-10 NOTE — Progress Notes (Signed)
Inpatient Diabetes Program Recommendations  AACE/ADA: New Consensus Statement on Inpatient Glycemic Control (2013)  Target Ranges:  Prepandial:   less than 140 mg/dL      Peak postprandial:   less than 180 mg/dL (1-2 hours)      Critically ill patients:  140 - 180 mg/dL   Review of Glycemic Control:  Results for DORMAN, CALDERWOOD (MRN [id number]) as of 10/10/2014 10:37  Ref. Range 10/09/2014 08:31 10/09/2014 11:40 10/09/2014 16:40 10/09/2014 21:06 10/10/2014 07:32  Glucose-Capillary Latest Ref Range: 65-99 mg/dL 222 (H) 270 (H) 182 (H) 273 (H) 192 (H)   Diabetes history: Type 2 diabetes Outpatient Diabetes medications: Metformin 500 mg bid Current orders for Inpatient glycemic control:  Lantus 6 units q HS, Novolog sensitive tid with meals, Metformin 500 mg bid  Please consider increasing Lantus to 12 units q HS while patient is on steroids.  Also consider increasing Novolog to moderate tid with meals.  Thanks, Adah Perl, RN, BC-ADM Inpatient Diabetes Coordinator Pager [phone] (8a-5p)

## 2014-10-10 NOTE — Evaluation (Signed)
Physical Therapy Evaluation Patient Details Name: Bradley [name] MRN: [id number] DOB: Sep 21, 1950 Today's Date: 10/10/2014   History of Present Illness  64 yo male with onset SOB and weakness was admitted for sepsis and PNA, multilobar.    Clinical Impression  Pt was seen for assessment of his tolerance for mobiltiy with no O2 today, but is able to manage his mobility with minor SOB.  O2 sats were stable at 92-93% pre and post gait, and needs some follow up therapy for strengthening LE's at home, then to outpatient for monitoring of his cardiopulmonary function.  Very motivated and should do well.    Follow Up Recommendations Home health PT;Supervision - Intermittent    Equipment Recommendations  None recommended by PT    Recommendations for Other Services       Precautions / Restrictions Precautions Precautions: Other (comment) (telemetry) Restrictions Weight Bearing Restrictions: No      Mobility  Bed Mobility Overal bed mobility: Modified Independent                Transfers Overall transfer level: Modified independent Equipment used: None                Ambulation/Gait Ambulation/Gait assistance: Supervision Ambulation Distance (Feet): 300 Feet Assistive device: None Gait Pattern/deviations: Step-through pattern;Drifts right/left;Narrow base of support Gait velocity: slow Gait velocity interpretation: Below normal speed for age/gender General Gait Details: Pt controlled his pace as he is slightly SOB during the trip.  O2 sats were collected pre and post with controlled effort.  Stairs            Wheelchair Mobility    Modified Rankin (Stroke Patients Only)       Balance Overall balance assessment: Modified Independent                                           Pertinent Vitals/Pain Pain Assessment: No/denies pain    Home Living Family/patient expects to be discharged to:: Private residence Living Arrangements:  Spouse/significant other;Other (Comment) (Wife in SNF right now) Available Help at Discharge: Family;Available PRN/intermittently Type of Home: House Home Access: Ramped entrance     Home Layout: One level Home Equipment: Shower seat;Bedside commode;Other (comment) (for his wife)      Prior Function Level of Independence: Independent (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[employer])         Comments: Works full time     Journalist, newspaper        Extremity/Trunk Assessment   Upper Extremity Assessment: Overall WFL for tasks assessed           Lower Extremity Assessment: Generalized weakness      Cervical / Trunk Assessment: Normal  Communication   Communication: No difficulties  Cognition Arousal/Alertness: Awake/alert Behavior During Therapy: WFL for tasks assessed/performed Overall Cognitive Status: Within Functional Limits for tasks assessed                      General Comments General comments (skin integrity, edema, etc.): Pt has some difficulty with exertion but is able to maintain O2 sats with gait.  Pre gait O2 sat 92% and pulse 97, post gait 93% and pulse 104.    Exercises        Assessment/Plan    PT Assessment All further PT needs can be met in the next venue of care  PT  Diagnosis Difficulty walking   PT Problem List Decreased strength;Decreased range of motion;Decreased activity tolerance;Decreased balance;Decreased mobility;Cardiopulmonary status limiting activity  PT Treatment Interventions     PT Goals (Current goals can be found in the Care Plan section) Acute Rehab PT Goals Patient Stated Goal: go home and back to work PT Goal Formulation: With patient Time For Goal Achievement: 10/24/14 Potential to Achieve Goals: Good    Frequency     Barriers to discharge Decreased caregiver support      Co-evaluation               End of Session   Activity Tolerance: Patient tolerated treatment well;Other (comment) (minimally by  SOB but was not related to O2 sats) Patient left: in bed;with call bell/phone within reach (Sitting bedside) Nurse Communication: Mobility status         Time: 1030-1055 PT Time Calculation (min) (ACUTE ONLY): 25 min   Charges:   PT Evaluation $Initial PT Evaluation Tier I: 1 Procedure PT Treatments $Gait Training: 8-22 mins   PT G CodesRamond Dial 10-23-14, 12:05 PM   Mee Hives, PT MS Acute Rehab Dept. Number: [facility] [phone] and [place] [phone]

## 2014-10-10 NOTE — Progress Notes (Signed)
   10/10/14 1400  Clinical Encounter Type  Visited With Patient  Visit Type Initial;Follow-up  Referral From Nurse  Spiritual Encounters  Spiritual Needs Literature;Brochure  Stress Factors  Patient Stress Factors Health changes  Family Stress Factors Health changes;Lack of caregivers  Advance Directives (For Healthcare)  Does patient have an advance directive? No  Would patient like information on creating an advanced directive? Yes - Educational materials given  Oswaldo Done engaged patient with AD. Chaplain worked to finish the AD and we did. Notary came and completed the AD.

## 2014-10-11 LAB — CBC
HEMATOCRIT: 37.2 % — AB (ref 40.0–52.0)
Hemoglobin: 12.2 g/dL — ABNORMAL LOW (ref 13.0–18.0)
MCH: 26.8 pg (ref 26.0–34.0)
MCHC: 32.7 g/dL (ref 32.0–36.0)
MCV: 81.9 fL (ref 80.0–100.0)
Platelets: 619 10*3/uL — ABNORMAL HIGH (ref 150–440)
RBC: 4.54 MIL/uL (ref 4.40–5.90)
RDW: 14.4 % (ref 11.5–14.5)
WBC: 18.4 10*3/uL — AB (ref 3.8–10.6)

## 2014-10-11 LAB — GLUCOSE, CAPILLARY: GLUCOSE-CAPILLARY: 134 mg/dL — AB (ref 65–99)

## 2014-10-11 MED ORDER — PREDNISONE 20 MG PO TABS
40.0000 mg | ORAL_TABLET | Freq: Every day | ORAL | Status: DC
  Administered 2014-10-11: 40 mg via ORAL
  Filled 2014-10-11: qty 2

## 2014-10-11 MED ORDER — AZITHROMYCIN 250 MG PO TABS
ORAL_TABLET | ORAL | Status: DC
Start: 2014-10-11 — End: 2015-04-12

## 2014-10-11 MED ORDER — PREDNISONE 5 MG PO TABS
ORAL_TABLET | ORAL | Status: DC
Start: 2014-10-11 — End: 2015-04-16

## 2014-10-11 MED ORDER — CEFUROXIME AXETIL 500 MG PO TABS: 500.0000 mg | ORAL_TABLET | Freq: Two times a day (BID) | ORAL | Status: DC

## 2014-10-11 NOTE — Discharge Instructions (Signed)
Need follow up CT chest 4-6 months

## 2014-10-11 NOTE — Care Management (Signed)
Spoke with patient who is up for discharge. Patient is alert and oriented and stated that he felt fine to go home. Stated that he gets around fine at home. Stated that he lives with family. He stated that he has a walker at home but that he doesn't use it. He is independent and drives self. Does not qualify for O2. Not home bound. Stated that he can well afford medications. No CM need identified. Discharge to self care.

## 2014-10-11 NOTE — Discharge Summary (Signed)
[facility] [place] NAME: Bradley [name]    MR#:  [id number]  DATE OF BIRTH:  Apr 10, 1950  DATE OF ADMISSION:  10/08/2014 ADMITTING PHYSICIAN: Idelle Crouch, MD  DATE OF DISCHARGE: 10/11/2014  PRIMARY CARE PHYSICIAN: Valera Castle, MD    ADMISSION DIAGNOSIS:  COPD  DISCHARGE DIAGNOSIS:  Principal Problem:   CAP (community acquired pneumonia) Active Problems:   COPD exacerbation   SECONDARY DIAGNOSIS:   Past Medical History  Diagnosis Date  . COPD (chronic obstructive pulmonary disease)   . Diabetes mellitus without complication   . Coronary artery disease   . Hypertension     HOSPITAL COURSE:   1. Clinical sepsis with multifocal bilateral community-acquired pneumonia. Patient was started on IV Rocephin and Zithromax and patient improved and switched over to by mouth Ceftin and Zithromax for completion of course. Repeat CT scan in 4-6 months for an nodule seen on CAT scan. 2. COPD exacerbation patient was on IV steroids during the hospital course will switch over to oral prednisone taper upon discharge home. 3. Type 2 diabetes with hyperglycemia while on steroids. In the hospital I did give low dose Lantus. He was advised to use his metformin twice a day at home. I will give a quick prednisone taper. The patient states that he takes metformin once a day at home. Once off steroids can probably go back down to once a day. 4. History of CAD 5. Hyperlipidemia unspecified on simvastatin 6. Gastroesophageal reflux disease without esophagitis on Protonix  DISCHARGE CONDITIONS:   Satisfactory  CONSULTS OBTAINED:  Treatment Team:  Erby Pian, MD  DRUG ALLERGIES:  No Known Allergies  DISCHARGE MEDICATIONS:   Current Discharge Medication List    START taking these medications   Details  azithromycin (ZITHROMAX) 250 MG tablet Take one tab daily until completion Qty: 3 tablet, Refills: 0    cefUROXime  (CEFTIN) 500 MG tablet Take 1 tablet (500 mg total) by mouth 2 (two) times daily with a meal. Qty: 14 tablet, Refills: 0    predniSONE (DELTASONE) 5 MG tablet Take 4 tabs po day1; take 3 tabs po day 2; 2 tab po day3,4; 1 tab day5,6 Qty: 13 tablet, Refills: 0      CONTINUE these medications which have NOT CHANGED   Details  albuterol (PROVENTIL) (2.5 MG/3ML) 0.083% nebulizer solution Take 2.5 mg by nebulization every 6 (six) hours as needed for wheezing or shortness of breath.    clopidogrel (PLAVIX) 75 MG tablet Take 75 mg by mouth daily.    enalapril (VASOTEC) 10 MG tablet Take 10 mg by mouth daily.    furosemide (LASIX) 40 MG tablet Take 40 mg by mouth daily as needed.    Ipratropium-Albuterol (COMBIVENT RESPIMAT IN) Inhale into the lungs.    aspirin 81 MG tablet Take 81 mg by mouth daily.    Fluticasone-Salmeterol (ADVAIR) 250-50 MCG/DOSE AEPB Inhale 1 puff into the lungs 2 (two) times daily.    metFORMIN (GLUCOPHAGE) 500 MG tablet Take by mouth 2 (two) times daily with a meal.    metoprolol (LOPRESSOR) 50 MG tablet Take 50 mg by mouth 2 (two) times daily.    nitroGLYCERIN (NITROSTAT) 0.4 MG SL tablet Place 0.4 mg under the tongue every 5 (five) minutes as needed for chest pain.    pantoprazole (PROTONIX) 40 MG tablet Take 40 mg by mouth daily.    simvastatin (ZOCOR) 20 MG tablet Take 20 mg by mouth daily.  tiotropium (SPIRIVA) 18 MCG inhalation capsule Place 18 mcg into inhaler and inhale daily.         DISCHARGE INSTRUCTIONS:   Follow-up with Dr. Kym Groom one week  If you experience worsening of your admission symptoms, develop shortness of breath, life threatening emergency, suicidal or homicidal thoughts you must seek medical attention immediately by calling 911 or calling your MD immediately  if symptoms less severe.  You Must read complete instructions/literature along with all the possible adverse reactions/side effects for all the Medicines you take and that  have been prescribed to you. Take any new Medicines after you have completely understood and accept all the possible adverse reactions/side effects.   Please note  You were cared for by a hospitalist during your hospital stay. If you have any questions about your discharge medications or the care you received while you were in the hospital after you are discharged, you can call the unit and asked to speak with the hospitalist on call if the hospitalist that took care of you is not available. Once you are discharged, your primary care physician will handle any further medical issues. Please note that NO REFILLS for any discharge medications will be authorized once you are discharged, as it is imperative that you return to your primary care physician (or establish a relationship with a primary care physician if you do not have one) for your aftercare needs so that they can reassess your need for medications and monitor your lab values.    Today   CHIEF COMPLAINT:  No chief complaint on file.   HISTORY OF PRESENT ILLNESS:  Bradley [name]  is a 64 y.o. male with a known history of COPD presented with shortness of breath   VITAL SIGNS:  Blood pressure 130/72, pulse 76, temperature 97.8 F (36.6 C), temperature source Oral, resp. rate 20, height '5\' 7"'$  (1.702 m), weight 64.592 kg (142 lb 6.4 oz), SpO2 92 %.  I/O:   Intake/Output Summary (Last 24 hours) at 10/11/14 3235 Last data filed at 10/11/14 0725  Gross per 24 hour  Intake    647 ml  Output   1950 ml  Net  -1303 ml    PHYSICAL EXAMINATION:  GENERAL:  64 y.o.-year-old patient lying in the bed with no acute distress.  EYES: Pupils equal, round, reactive to light and accommodation. No scleral icterus. Extraocular muscles intact.  HEENT: Head atraumatic, normocephalic. Oropharynx and nasopharynx clear.  NECK:  Supple, no jugular venous distention. No thyroid enlargement, no tenderness.  LUNGS: Coarse breath sounds bilaterally, no wheezing,  rales,rhonchi or crepitation. No use of accessory muscles of respiration.  CARDIOVASCULAR: S1, S2 normal. No murmurs, rubs, or gallops.  ABDOMEN: Soft, non-tender, non-distended. Bowel sounds present. No organomegaly or mass.  EXTREMITIES: No pedal edema, cyanosis, or clubbing.  NEUROLOGIC: Cranial nerves II through XII are intact. Muscle strength 5/5 in all extremities. Sensation intact. Gait not checked.  PSYCHIATRIC: The patient is alert and oriented x 3.  SKIN: No obvious rash, lesion, or ulcer.    Management plans discussed with the patient, family and they are in agreement.  CODE STATUS:     Code Status Orders        Start     Ordered   10/08/14 1549  Full code   Continuous     10/08/14 1548      TOTAL TIME TAKING CARE OF THIS PATIENT: 35 minutes.    Loletha Grayer M.D on 10/11/2014 at 8:22 AM  Between 7am to  6pm - Pager - [phone]  After 6pm go to [email or web address] - password [email or web address] [facility]  Office  [phone]  CC: Primary care physician; Valera Castle, MD

## 2014-10-11 NOTE — Progress Notes (Signed)
Pt A and O x 4. VSS. Pt tolerating diet well. No complaints of pain or nausea. IV removed intact, prescriptions given. Pt voiced understanding of discharge instructions with no further questions. Pt discharged via wheelchair with axillary.

## 2014-10-12 LAB — CULTURE, RESPIRATORY W GRAM STAIN: Culture: NORMAL

## 2014-10-12 LAB — CULTURE, RESPIRATORY

## 2014-11-01 LAB — EXPECTORATED SPUTUM ASSESSMENT W REFEX TO RESP CULTURE

## 2014-11-01 LAB — EXPECTORATED SPUTUM ASSESSMENT W GRAM STAIN, RFLX TO RESP C: Special Requests: NORMAL

## 2015-04-05 DIAGNOSIS — J310 Chronic rhinitis: Secondary | ICD-10-CM | POA: Diagnosis not present

## 2015-04-05 DIAGNOSIS — R0602 Shortness of breath: Secondary | ICD-10-CM | POA: Diagnosis not present

## 2015-04-05 DIAGNOSIS — R05 Cough: Secondary | ICD-10-CM | POA: Diagnosis not present

## 2015-04-05 DIAGNOSIS — J441 Chronic obstructive pulmonary disease with (acute) exacerbation: Secondary | ICD-10-CM | POA: Diagnosis not present

## 2015-04-12 ENCOUNTER — Emergency Department: Payer: PPO

## 2015-04-12 ENCOUNTER — Encounter: Payer: Self-pay | Admitting: Emergency Medicine

## 2015-04-12 ENCOUNTER — Inpatient Hospital Stay
Admission: EM | Admit: 2015-04-12 | Discharge: 2015-04-16 | DRG: 189 | Disposition: A | Discharge status: Home / Self Care | Payer: PPO | Attending: Internal Medicine | Admitting: Internal Medicine

## 2015-04-12 DIAGNOSIS — R0602 Shortness of breath: Secondary | ICD-10-CM | POA: Diagnosis not present

## 2015-04-12 DIAGNOSIS — Z716 Tobacco abuse counseling: Secondary | ICD-10-CM | POA: Diagnosis not present

## 2015-04-12 DIAGNOSIS — J441 Chronic obstructive pulmonary disease with (acute) exacerbation: Secondary | ICD-10-CM | POA: Diagnosis not present

## 2015-04-12 DIAGNOSIS — Z720 Tobacco use: Secondary | ICD-10-CM | POA: Diagnosis not present

## 2015-04-12 DIAGNOSIS — Z9889 Other specified postprocedural states: Secondary | ICD-10-CM

## 2015-04-12 DIAGNOSIS — Z7951 Long term (current) use of inhaled steroids: Secondary | ICD-10-CM | POA: Diagnosis not present

## 2015-04-12 DIAGNOSIS — Z809 Family history of malignant neoplasm, unspecified: Secondary | ICD-10-CM | POA: Diagnosis not present

## 2015-04-12 DIAGNOSIS — R0603 Acute respiratory distress: Secondary | ICD-10-CM

## 2015-04-12 DIAGNOSIS — R1310 Dysphagia, unspecified: Secondary | ICD-10-CM | POA: Diagnosis not present

## 2015-04-12 DIAGNOSIS — F17210 Nicotine dependence, cigarettes, uncomplicated: Secondary | ICD-10-CM | POA: Diagnosis present

## 2015-04-12 DIAGNOSIS — Z8249 Family history of ischemic heart disease and other diseases of the circulatory system: Secondary | ICD-10-CM

## 2015-04-12 DIAGNOSIS — I2510 Atherosclerotic heart disease of native coronary artery without angina pectoris: Secondary | ICD-10-CM | POA: Diagnosis not present

## 2015-04-12 DIAGNOSIS — E119 Type 2 diabetes mellitus without complications: Secondary | ICD-10-CM | POA: Diagnosis present

## 2015-04-12 DIAGNOSIS — Z79899 Other long term (current) drug therapy: Secondary | ICD-10-CM | POA: Diagnosis not present

## 2015-04-12 DIAGNOSIS — E871 Hypo-osmolality and hyponatremia: Secondary | ICD-10-CM | POA: Diagnosis not present

## 2015-04-12 DIAGNOSIS — F4323 Adjustment disorder with mixed anxiety and depressed mood: Secondary | ICD-10-CM | POA: Diagnosis not present

## 2015-04-12 DIAGNOSIS — I10 Essential (primary) hypertension: Secondary | ICD-10-CM | POA: Diagnosis not present

## 2015-04-12 DIAGNOSIS — F17200 Nicotine dependence, unspecified, uncomplicated: Secondary | ICD-10-CM | POA: Diagnosis not present

## 2015-04-12 DIAGNOSIS — J9621 Acute and chronic respiratory failure with hypoxia: Secondary | ICD-10-CM | POA: Diagnosis not present

## 2015-04-12 DIAGNOSIS — Z7982 Long term (current) use of aspirin: Secondary | ICD-10-CM | POA: Diagnosis not present

## 2015-04-12 DIAGNOSIS — G4700 Insomnia, unspecified: Secondary | ICD-10-CM | POA: Diagnosis not present

## 2015-04-12 DIAGNOSIS — Z23 Encounter for immunization: Secondary | ICD-10-CM | POA: Diagnosis not present

## 2015-04-12 DIAGNOSIS — J189 Pneumonia, unspecified organism: Secondary | ICD-10-CM

## 2015-04-12 DIAGNOSIS — F4321 Adjustment disorder with depressed mood: Secondary | ICD-10-CM

## 2015-04-12 DIAGNOSIS — R0600 Dyspnea, unspecified: Secondary | ICD-10-CM | POA: Diagnosis present

## 2015-04-12 LAB — CBC WITH DIFFERENTIAL/PLATELET
BASOS ABS: 0.1 10*3/uL (ref 0–0.1)
Basophils Relative: 1 %
EOS ABS: 0.8 10*3/uL — AB (ref 0–0.7)
Eosinophils Relative: 5 %
HCT: 40.5 % (ref 40.0–52.0)
HEMOGLOBIN: 13.3 g/dL (ref 13.0–18.0)
LYMPHS ABS: 3.7 10*3/uL — AB (ref 1.0–3.6)
LYMPHS PCT: 25 %
MCH: 26.1 pg (ref 26.0–34.0)
MCHC: 32.9 g/dL (ref 32.0–36.0)
MCV: 79.5 fL — AB (ref 80.0–100.0)
Monocytes Absolute: 1.3 10*3/uL — ABNORMAL HIGH (ref 0.2–1.0)
Monocytes Relative: 9 %
NEUTROS PCT: 60 %
Neutro Abs: 9.1 10*3/uL — ABNORMAL HIGH (ref 1.4–6.5)
Platelets: 361 10*3/uL (ref 150–440)
RBC: 5.09 MIL/uL (ref 4.40–5.90)
RDW: 17.4 % — ABNORMAL HIGH (ref 11.5–14.5)
WBC: 15 10*3/uL — AB (ref 3.8–10.6)

## 2015-04-12 LAB — BASIC METABOLIC PANEL
Anion gap: 9 (ref 5–15)
BUN: 18 mg/dL (ref 6–20)
CALCIUM: 8.6 mg/dL — AB (ref 8.9–10.3)
CO2: 24 mmol/L (ref 22–32)
CREATININE: 0.76 mg/dL (ref 0.61–1.24)
Chloride: 96 mmol/L — ABNORMAL LOW (ref 101–111)
GFR calc non Af Amer: >60 mL/min (ref >60)
Glucose, Bld: 142 mg/dL — ABNORMAL HIGH (ref 65–99)
Potassium: 3.8 mmol/L (ref 3.5–5.1)
Sodium: 129 mmol/L — ABNORMAL LOW (ref 135–145)

## 2015-04-12 LAB — CREATININE, SERUM
CREATININE: 0.68 mg/dL (ref 0.61–1.24)
GFR calc non Af Amer: >60 mL/min (ref >60)

## 2015-04-12 LAB — CBC
HCT: 38.3 % — ABNORMAL LOW (ref 40.0–52.0)
HEMOGLOBIN: 12.6 g/dL — AB (ref 13.0–18.0)
MCH: 25.8 pg — AB (ref 26.0–34.0)
MCHC: 32.8 g/dL (ref 32.0–36.0)
MCV: 78.8 fL — ABNORMAL LOW (ref 80.0–100.0)
Platelets: 346 10*3/uL (ref 150–440)
RBC: 4.86 MIL/uL (ref 4.40–5.90)
RDW: 17 % — ABNORMAL HIGH (ref 11.5–14.5)
WBC: 13.1 10*3/uL — AB (ref 3.8–10.6)

## 2015-04-12 LAB — TROPONIN I

## 2015-04-12 LAB — GLUCOSE, CAPILLARY
GLUCOSE-CAPILLARY: 158 mg/dL — AB (ref 65–99)
GLUCOSE-CAPILLARY: 165 mg/dL — AB (ref 65–99)
Glucose-Capillary: 211 mg/dL — ABNORMAL HIGH (ref 65–99)

## 2015-04-12 LAB — BRAIN NATRIURETIC PEPTIDE: B Natriuretic Peptide: 36 pg/mL (ref 0.0–100.0)

## 2015-04-12 MED ORDER — IPRATROPIUM BROMIDE 0.02 % IN SOLN
0.5000 mg | Freq: Once | RESPIRATORY_TRACT | Status: AC
  Administered 2015-04-12: 0.5 mg via RESPIRATORY_TRACT
  Filled 2015-04-12: qty 2.5

## 2015-04-12 MED ORDER — ALBUTEROL SULFATE (2.5 MG/3ML) 0.083% IN NEBU
5.0000 mg | INHALATION_SOLUTION | Freq: Once | RESPIRATORY_TRACT | Status: AC
  Administered 2015-04-12: 5 mg via RESPIRATORY_TRACT
  Filled 2015-04-12: qty 6

## 2015-04-12 MED ORDER — METOPROLOL TARTRATE 50 MG PO TABS
50.0000 mg | ORAL_TABLET | Freq: Two times a day (BID) | ORAL | Status: DC
  Administered 2015-04-12 – 2015-04-16 (×9): 50 mg via ORAL
  Filled 2015-04-12 (×9): qty 1

## 2015-04-12 MED ORDER — LEVOFLOXACIN IN D5W 750 MG/150ML IV SOLN
750.0000 mg | Freq: Once | INTRAVENOUS | Status: AC
  Administered 2015-04-12: 750 mg via INTRAVENOUS
  Filled 2015-04-12: qty 150

## 2015-04-12 MED ORDER — SIMVASTATIN 20 MG PO TABS
20.0000 mg | ORAL_TABLET | Freq: Every day | ORAL | Status: DC
  Administered 2015-04-12 – 2015-04-16 (×5): 20 mg via ORAL
  Filled 2015-04-12 (×5): qty 1

## 2015-04-12 MED ORDER — PANTOPRAZOLE SODIUM 40 MG PO TBEC
40.0000 mg | DELAYED_RELEASE_TABLET | Freq: Every day | ORAL | Status: DC
  Administered 2015-04-12 – 2015-04-16 (×5): 40 mg via ORAL
  Filled 2015-04-12 (×5): qty 1

## 2015-04-12 MED ORDER — ASPIRIN 81 MG PO CHEW
81.0000 mg | CHEWABLE_TABLET | Freq: Every day | ORAL | Status: DC
  Administered 2015-04-12 – 2015-04-16 (×5): 81 mg via ORAL
  Filled 2015-04-12 (×5): qty 1

## 2015-04-12 MED ORDER — ENALAPRIL MALEATE 5 MG PO TABS
10.0000 mg | ORAL_TABLET | Freq: Every day | ORAL | Status: DC
  Administered 2015-04-12 – 2015-04-16 (×5): 10 mg via ORAL
  Filled 2015-04-12 (×5): qty 2

## 2015-04-12 MED ORDER — IPRATROPIUM-ALBUTEROL 0.5-2.5 (3) MG/3ML IN SOLN
3.0000 mL | RESPIRATORY_TRACT | Status: DC
  Administered 2015-04-12 – 2015-04-16 (×24): 3 mL via RESPIRATORY_TRACT
  Filled 2015-04-12 (×27): qty 3

## 2015-04-12 MED ORDER — TIOTROPIUM BROMIDE MONOHYDRATE 18 MCG IN CAPS
18.0000 ug | ORAL_CAPSULE | Freq: Every day | RESPIRATORY_TRACT | Status: DC
Start: 2015-04-12 — End: 2015-04-16
  Administered 2015-04-12 – 2015-04-16 (×5): 18 ug via RESPIRATORY_TRACT
  Filled 2015-04-12 (×2): qty 5

## 2015-04-12 MED ORDER — FLUTICASONE PROPIONATE 50 MCG/ACT NA SUSP
2.0000 | Freq: Every day | NASAL | Status: DC
  Administered 2015-04-12 – 2015-04-16 (×5): 2 via NASAL
  Filled 2015-04-12: qty 16

## 2015-04-12 MED ORDER — SODIUM CHLORIDE 0.9 % IV SOLN
INTRAVENOUS | Status: DC
Start: 2015-04-12 — End: 2015-04-16
  Administered 2015-04-12 – 2015-04-16 (×8): via INTRAVENOUS

## 2015-04-12 MED ORDER — METHYLPREDNISOLONE SODIUM SUCC 125 MG IJ SOLR
60.0000 mg | Freq: Four times a day (QID) | INTRAMUSCULAR | Status: DC
  Administered 2015-04-12 – 2015-04-13 (×4): 60 mg via INTRAVENOUS
  Filled 2015-04-12 (×4): qty 2

## 2015-04-12 MED ORDER — METFORMIN HCL 500 MG PO TABS
500.0000 mg | ORAL_TABLET | Freq: Two times a day (BID) | ORAL | Status: DC
  Administered 2015-04-12 – 2015-04-16 (×9): 500 mg via ORAL
  Filled 2015-04-12 (×9): qty 1

## 2015-04-12 MED ORDER — SALINE SPRAY 0.65 % NA SOLN
1.0000 | Freq: Three times a day (TID) | NASAL | Status: DC
  Administered 2015-04-12 – 2015-04-16 (×12): 1 via NASAL
  Filled 2015-04-12: qty 44

## 2015-04-12 MED ORDER — NITROGLYCERIN 0.4 MG SL SUBL: 0.4000 mg | SUBLINGUAL_TABLET | SUBLINGUAL | Status: DC | PRN

## 2015-04-12 MED ORDER — INSULIN ASPART 100 UNIT/ML ~~LOC~~ SOLN
0.0000 [IU] | Freq: Three times a day (TID) | SUBCUTANEOUS | Status: DC
  Administered 2015-04-12: 2 [IU] via SUBCUTANEOUS
  Administered 2015-04-12 – 2015-04-13 (×2): 3 [IU] via SUBCUTANEOUS
  Administered 2015-04-13: 1 [IU] via SUBCUTANEOUS
  Administered 2015-04-13: 2 [IU] via SUBCUTANEOUS
  Administered 2015-04-14: 09:00:00 1 [IU] via SUBCUTANEOUS
  Filled 2015-04-12: qty 3
  Filled 2015-04-12 (×2): qty 2
  Filled 2015-04-12 (×2): qty 1
  Filled 2015-04-12: qty 3

## 2015-04-12 MED ORDER — PNEUMOCOCCAL VAC POLYVALENT 25 MCG/0.5ML IJ INJ
0.5000 mL | INJECTION | INTRAMUSCULAR | Status: AC
  Administered 2015-04-13: 08:00:00 0.5 mL via INTRAMUSCULAR
  Filled 2015-04-12: qty 0.5

## 2015-04-12 MED ORDER — LEVOFLOXACIN IN D5W 750 MG/150ML IV SOLN
750.0000 mg | INTRAVENOUS | Status: DC
  Administered 2015-04-13: 08:00:00 750 mg via INTRAVENOUS
  Filled 2015-04-12: qty 150

## 2015-04-12 MED ORDER — HEPARIN SODIUM (PORCINE) 5000 UNIT/ML IJ SOLN
5000.0000 [IU] | Freq: Three times a day (TID) | INTRAMUSCULAR | Status: DC
  Administered 2015-04-12 – 2015-04-16 (×12): 5000 [IU] via SUBCUTANEOUS
  Filled 2015-04-12 (×12): qty 1

## 2015-04-12 MED ORDER — BUDESONIDE 0.25 MG/2ML IN SUSP
0.2500 mg | Freq: Two times a day (BID) | RESPIRATORY_TRACT | Status: DC
  Administered 2015-04-12 – 2015-04-16 (×8): 0.25 mg via RESPIRATORY_TRACT
  Filled 2015-04-12 (×8): qty 2

## 2015-04-12 MED ORDER — CLOPIDOGREL BISULFATE 75 MG PO TABS
75.0000 mg | ORAL_TABLET | Freq: Every day | ORAL | Status: DC
Start: 2015-04-12 — End: 2015-04-16
  Administered 2015-04-12 – 2015-04-16 (×5): 75 mg via ORAL
  Filled 2015-04-12 (×5): qty 1

## 2015-04-12 NOTE — ED Notes (Signed)
Patient presents to the ED with cough, congestion and shortness of breath.  Patient states he was diagnosed with bronchitis last Tuesday and was prescribed antibiotics.  Patient reports a history of COPD.  Patient was given solumedrol '125mg'$  and 1 breathing treatment by EMS.  Patient reports not feeling better with antibiotics or breathing treatments at home.

## 2015-04-12 NOTE — H&P (Signed)
[facility] [place] NAME: Bradley [name]    MR#:  [id number]  DATE OF BIRTH:  03/14/1950  DATE OF ADMISSION:  04/12/2015  PRIMARY CARE PHYSICIAN: Valera Castle, MD   REQUESTING/REFERRING PHYSICIAN: McShane  CHIEF COMPLAINT:   Chief Complaint  Patient presents with  . Shortness of Breath    HISTORY OF PRESENT ILLNESS: Bradley [name]  is a 65 y.o. male with a known history of , coronary artery disease, hypertension- for last 6-7 days started having chest tightness and cough, he spoke to his pulmonologist Dr. Raul Del- he prescribed oral steroid and antibiotic which she took for 5 days- still continued to have cough and shortness of breath so came to emergency room. He denies any fever or chills but had slight sputum.  PAST MEDICAL HISTORY:   Past Medical History  Diagnosis Date  . COPD (chronic obstructive pulmonary disease) ([place])   . Diabetes mellitus without complication ([place])   . Coronary artery disease   . Hypertension     PAST SURGICAL HISTORY: Past Surgical History  Procedure Laterality Date  . Lung biopsy Right     2006    SOCIAL HISTORY:  Social History  Substance Use Topics  . Smoking status: Current Every Day Smoker -- 0.25 packs/day  . Smokeless tobacco: Never Used  . Alcohol Use: No    FAMILY HISTORY:  Family History  Problem Relation Age of Onset  . Cancer Mother   . Heart attack Father   . Cancer Sister     DRUG ALLERGIES: No Known Allergies  REVIEW OF SYSTEMS:   CONSTITUTIONAL: No fever, fatigue or weakness.  EYES: No blurred or double vision.  EARS, NOSE, AND THROAT: No tinnitus or ear pain.  RESPIRATORY: some cough, shortness of breath, wheezing or hemoptysis.  CARDIOVASCULAR: No chest pain, orthopnea, edema.  GASTROINTESTINAL: No nausea, vomiting, diarrhea or abdominal pain.  GENITOURINARY: No dysuria, hematuria.  ENDOCRINE: No polyuria, nocturia,  HEMATOLOGY: No anemia, easy bruising or  bleeding SKIN: No rash or lesion. MUSCULOSKELETAL: No joint pain or arthritis.   NEUROLOGIC: No tingling, numbness, weakness.  PSYCHIATRY: No anxiety or depression.   MEDICATIONS AT HOME:  Prior to Admission medications   Medication Sig Start Date End Date Taking? Authorizing Provider  albuterol (PROVENTIL) (2.5 MG/3ML) 0.083% nebulizer solution Take 2.5 mg by nebulization every 6 (six) hours as needed for wheezing or shortness of breath.    Historical Provider, MD  aspirin 81 MG tablet Take 81 mg by mouth daily.    Historical Provider, MD  azithromycin (ZITHROMAX) 250 MG tablet Take one tab daily until completion 10/11/14   Loletha Grayer, MD  cefUROXime (CEFTIN) 500 MG tablet Take 1 tablet (500 mg total) by mouth 2 (two) times daily with a meal. 10/11/14   Loletha Grayer, MD  clopidogrel (PLAVIX) 75 MG tablet Take 75 mg by mouth daily.    Historical Provider, MD  enalapril (VASOTEC) 10 MG tablet Take 10 mg by mouth daily.    Historical Provider, MD  Fluticasone-Salmeterol (ADVAIR) 250-50 MCG/DOSE AEPB Inhale 1 puff into the lungs 2 (two) times daily.    Historical Provider, MD  furosemide (LASIX) 40 MG tablet Take 40 mg by mouth daily as needed.    Historical Provider, MD  Ipratropium-Albuterol ([place]) Inhale into the lungs.    Historical Provider, MD  metFORMIN (GLUCOPHAGE) 500 MG tablet Take by mouth 2 (two) times daily with a meal.    Historical Provider,  MD  metoprolol (LOPRESSOR) 50 MG tablet Take 50 mg by mouth 2 (two) times daily.    Historical Provider, MD  nitroGLYCERIN (NITROSTAT) 0.4 MG SL tablet Place 0.4 mg under the tongue every 5 (five) minutes as needed for chest pain.    Historical Provider, MD  pantoprazole (PROTONIX) 40 MG tablet Take 40 mg by mouth daily.    Historical Provider, MD  predniSONE (DELTASONE) 5 MG tablet Take 4 tabs po day1; take 3 tabs po day 2; 2 tab po day3,4; 1 tab day5,6 10/11/14   Loletha Grayer, MD  simvastatin (ZOCOR) 20 MG tablet Take 20  mg by mouth daily.    Historical Provider, MD  tiotropium (SPIRIVA) 18 MCG inhalation capsule Place 18 mcg into inhaler and inhale daily.    Historical Provider, MD      PHYSICAL EXAMINATION:   VITAL SIGNS: Blood pressure 163/87, pulse 94, temperature 98 F (36.7 C), temperature source Oral, resp. rate 22, height '5\' 7"'$  (1.702 m), weight 68.04 kg (150 lb), SpO2 94 %.  GENERAL:  65 y.o.-year-old patient lying in the bed with no acute distress.  EYES: Pupils equal, round, reactive to light and accommodation. No scleral icterus. Extraocular muscles intact.  HEENT: Head atraumatic, normocephalic. Oropharynx and nasopharynx clear.  NECK:  Supple, no jugular venous distention. No thyroid enlargement, no tenderness.  LUNGS: Normal breath sounds bilaterally, some wheezing, no crepitation. No use of accessory muscles of respiration.  CARDIOVASCULAR: S1, S2 normal. No murmurs, rubs, or gallops.  ABDOMEN: Soft, nontender, nondistended. Bowel sounds present. No organomegaly or mass.  EXTREMITIES: No pedal edema, cyanosis, or clubbing.  NEUROLOGIC: Cranial nerves II through XII are intact. Muscle strength 5/5 in all extremities. Sensation intact. Gait not checked.  PSYCHIATRIC: The patient is alert and oriented x 3.  SKIN: No obvious rash, lesion, or ulcer.   LABORATORY PANEL:   CBC  Recent Labs Lab 04/12/15 0744  WBC 15.0*  HGB 13.3  HCT 40.5  PLT 361  MCV 79.5*  MCH 26.1  MCHC 32.9  RDW 17.4*  LYMPHSABS 3.7*  MONOABS 1.3*  EOSABS 0.8*  BASOSABS 0.1   ------------------------------------------------------------------------------------------------------------------  Chemistries   Recent Labs Lab 04/12/15 0744  NA 129*  K 3.8  CL 96*  CO2 24  GLUCOSE 142*  BUN 18  CREATININE 0.76  CALCIUM 8.6*   ------------------------------------------------------------------------------------------------------------------ estimated creatinine clearance is 86.1 mL/min (by C-G formula  based on Cr of 0.76). ------------------------------------------------------------------------------------------------------------------ No results for input(s): TSH, T4TOTAL, T3FREE, THYROIDAB in the last 72 hours.  Invalid input(s): FREET3   Coagulation profile No results for input(s): INR, PROTIME in the last 168 hours. ------------------------------------------------------------------------------------------------------------------- No results for input(s): DDIMER in the last 72 hours. -------------------------------------------------------------------------------------------------------------------  Cardiac Enzymes  Recent Labs Lab 04/12/15 0744  TROPONINI <0.03   ------------------------------------------------------------------------------------------------------------------ Invalid input(s): POCBNP  ---------------------------------------------------------------------------------------------------------------  Urinalysis No results found for: COLORURINE, APPEARANCEUR, LABSPEC, PHURINE, GLUCOSEU, HGBUR, BILIRUBINUR, KETONESUR, PROTEINUR, UROBILINOGEN, NITRITE, LEUKOCYTESUR   RADIOLOGY: Dg Chest 2 View  04/12/2015  CLINICAL DATA:  Bronchitis, sick for 8 days, on prednisone and antibiotics, shortness of breath, history hypertension, diabetes mellitus, COPD/emphysema, coronary artery disease, former smoker EXAM: CHEST  2 VIEW COMPARISON:  07/14/2014; correlation CT chest 10/08/2014 FINDINGS: Normal heart size, mediastinal contours, and pulmonary vascularity. Atherosclerotic calcification aorta. Emphysematous and bronchitic changes compatible with severe COPD. Extensive scarring greatest in RIGHT upper lobe. New nodular density LEFT lateral lung base versus superimposed artifact. From its position, I doubt this represents a nipple shadow. On lateral view, several EKG  leads identified at the lower anterior chest, uncertain if the new nodular density could be related to an EKG lead but  pulmonary nodule not excluded. Blunting of RIGHT costophrenic angle appears stable. Atelectasis RIGHT mid lung posteriorly appears unchanged with surgical clips at region likely a combination of postoperative and post inflammatory changes. No definite acute infiltrate, pleural effusion or pneumothorax. IMPRESSION: Severe COPD with scarring in RIGHT upper lobe and posterior mid RIGHT lung. New nodular density at LEFT lung base is of uncertain etiology, not felt to represent a nipple shadow but potentially could be related to a superimposed EKG lead; repeat PA chest radiograph with nipple markers and removal of all EKG leads recommended to exclude pulmonary nodule. Electronically Signed   By: Lavonia Dana M.D.   On: 04/12/2015 08:12    EKG: Orders placed or performed during the hospital encounter of 04/12/15  . ED EKG  . ED EKG    IMPRESSION AND PLAN:  * COPD exacerbation  We'll give IV steroid, nebulizer treatment, and IV Levaquin.   The oxygen for now and try to taper to room air later.  * Hyponatremia  Give IV fluid normal saline.  * Coronary artery disease  Continue home medication.  * Hypertension  Continue home meds.  * Diabetes  Continue oral meds, give insulin sliding scale coverage.  * Active smoking  Tobacco cessation counseling is done for 4 minutes and offered nicotine patch.   All the records are reviewed and case discussed with ED provider. Management plans discussed with the patient, family and they are in agreement.  CODE STATUS: Code Status History    Date Active Date Inactive Code Status Order ID Comments User Context   10/08/2014  3:48 PM 10/11/2014  2:26 PM Full Code [id number]  Idelle Crouch, MD Inpatient    Advance Directive Documentation        Most Recent Value   Type of Advance Directive  Healthcare Power of Attorney   Pre-existing out of facility DNR order (yellow form or pink MOST form)     "MOST" Form in Place?         TOTAL TIME TAKING CARE OF  THIS PATIENT: 35 minutes.    Vaughan Basta M.D on 04/12/2015   Between 7am to 6pm - Pager - [phone]  After 6pm go to [email or web address] - password [email or web address] [facility]  Office  [phone]  CC: Primary care physician; Valera Castle, MD   Note: This dictation was prepared with Dragon dictation along with smaller phrase technology. Any transcriptional errors that result from this process are unintentional.

## 2015-04-12 NOTE — Consult Note (Signed)
Pharmacy Antibiotic Note  RORY XIANG is a 65 y.o. male admitted on 04/12/2015 with bronchitis.  Pharmacy has been consulted for levofloxacin dosing.  Plan: levofloxacin '750mg'$  q 24 hours  Height: '5\' 7"'$  (170.2 cm) Weight: 150 lb (68.04 kg) IBW/kg (Calculated) : 66.1  Temp (24hrs), Avg:98 F (36.7 C), Min:98 F (36.7 C), Max:98 F (36.7 C)   Recent Labs Lab 04/12/15 0744  WBC 15.0*  CREATININE 0.76    Estimated Creatinine Clearance: 86.1 mL/min (by C-G formula based on Cr of 0.76).    No Known Allergies  Antimicrobials this admission: Levofloxacin (OP) 2/1 >>   Dose adjustments this admission:   Microbiology results: 2/8 BCx:    Thank you for allowing pharmacy to be a part of this patient's care.  Ramond Dial 04/12/2015 9:29 AM

## 2015-04-12 NOTE — ED Provider Notes (Addendum)
[facility] Emergency Department Provider Note  ____________________________________________   I have reviewed the triage vital signs and the nursing notes.   HISTORY  Chief Complaint Shortness of Breath    HPI Bradley [name] is a 65 y.o. male presents today complaining of shortness of breath. Patient is a history of COPD, as well as lasix use though without clear chf documented that I can see or that he knows of. He has had lung biopsy in the past.. . He is on steroids for the last week as well as antibiotics at home. He has no recollection of what antibiotic he has been taking. He states that the cough and shortness of breath or persisting and worsening. He denies any fever although he felt warm. He does take Lasix he states his legs are slightly swollen but not significantly so. He denies any chest pain. He has a history of CAD as well. He denies any exertional symptoms aside from his wheeze and cough. He denies any history of PE or DVT. EMS did give him albuterol and he took albuterol at home he was also given Solu-Medrol prior to arrival. Patient is not on home oxygen. He continues to feel short of breath and to complain of cough.States the cough is productive.  Past Medical History  Diagnosis Date  . COPD (chronic obstructive pulmonary disease) ([place])   . Diabetes mellitus without complication ([place])   . Coronary artery disease   . Hypertension     Patient Active Problem List   Diagnosis Date Noted  . COPD exacerbation ([place]) 10/08/2014  . CAP (community acquired pneumonia) 10/08/2014  . Hypogammaglobulinemia ([place]) 08/17/2014  . Leukocytosis 08/03/2014  . Recurrent infections 08/03/2014    Past Surgical History  Procedure Laterality Date  . Lung biopsy Right     2006    Current Outpatient Rx  Name  Route  Sig  Dispense  Refill  . albuterol (PROVENTIL) (2.5 MG/3ML) 0.083% nebulizer solution   Nebulization   Take 2.5 mg by nebulization every 6 (six)  hours as needed for wheezing or shortness of breath.         Marland Kitchen aspirin 81 MG tablet   Oral   Take 81 mg by mouth daily.         Marland Kitchen azithromycin (ZITHROMAX) 250 MG tablet      Take one tab daily until completion   3 tablet   0   . cefUROXime (CEFTIN) 500 MG tablet   Oral   Take 1 tablet (500 mg total) by mouth 2 (two) times daily with a meal.   14 tablet   0   . clopidogrel (PLAVIX) 75 MG tablet   Oral   Take 75 mg by mouth daily.         . enalapril (VASOTEC) 10 MG tablet   Oral   Take 10 mg by mouth daily.         . Fluticasone-Salmeterol (ADVAIR) 250-50 MCG/DOSE AEPB   Inhalation   Inhale 1 puff into the lungs 2 (two) times daily.         . furosemide (LASIX) 40 MG tablet   Oral   Take 40 mg by mouth daily as needed.         . Ipratropium-Albuterol (COMBIVENT RESPIMAT IN)   Inhalation   Inhale into the lungs.         . metFORMIN (GLUCOPHAGE) 500 MG tablet   Oral   Take by mouth 2 (two) times daily with  a meal.         . metoprolol (LOPRESSOR) 50 MG tablet   Oral   Take 50 mg by mouth 2 (two) times daily.         . nitroGLYCERIN (NITROSTAT) 0.4 MG SL tablet   Sublingual   Place 0.4 mg under the tongue every 5 (five) minutes as needed for chest pain.         . pantoprazole (PROTONIX) 40 MG tablet   Oral   Take 40 mg by mouth daily.         . predniSONE (DELTASONE) 5 MG tablet      Take 4 tabs po day1; take 3 tabs po day 2; 2 tab po day3,4; 1 tab day5,6   13 tablet   0   . simvastatin (ZOCOR) 20 MG tablet   Oral   Take 20 mg by mouth daily.         Marland Kitchen tiotropium (SPIRIVA) 18 MCG inhalation capsule   Inhalation   Place 18 mcg into inhaler and inhale daily.           Allergies Review of patient's allergies indicates no known allergies.  Family History  Problem Relation Age of Onset  . Cancer Mother   . Heart attack Father   . Cancer Sister     Social History Social History  Substance Use Topics  . Smoking status:  Former Research scientist (life sciences)  . Smokeless tobacco: Never Used  . Alcohol Use: No    Review of Systems Constitutional: No fever/chills Eyes: No visual changes. ENT: No sore throat. No stiff neck no neck pain Cardiovascular: Denies chest pain. Resp Shortness of breath Gastrointestinal:   no vomiting.  No diarrhea.  No constipation. Genitourinary: Negative for dysuria. MusculoskeletaTrace chronicr extremity swelling Skin: Negative for rash. Neurological: Negative for headaches, focal weakness or numbness. 10-point ROS otherwise negative.  ____________________________________________   PHYSICAL EXAM:  VITAL SIGNS: ED Triage Vitals  Enc Vitals Group     BP 04/12/15 0733 163/87 mmHg     Pulse Rate 04/12/15 0733 94     Resp 04/12/15 0733 22     Temp 04/12/15 0733 98 F (36.7 C)     Temp Source 04/12/15 0733 Oral     SpO2 04/12/15 0733 94 %     Weight 04/12/15 0730 150 lb (68.04 kg)     Height 04/12/15 0730 '5\' 7"'$  (1.702 m)     Head Cir --      Peak Flow --      Pain Score 04/12/15 0731 0     Pain Loc --      Pain Edu? --      Excl. in [place]? --     Constitutional: Alert and oriented. Well appearing and in no acute distress. Eyes: Conjunctivae are normal. PERRL. EOMI. Head: Atraumatic. Nose: No congestion/rhinnorhea. Mouth/Throat: Mucous membranes are moist.  Oropharynx non-erythematous. Neck: No stridor.   Nontender with no meningismus Cardiovascular: Normal rate, regular rhythm. Grossly normal heart sounds.  Good peripheral circulation. RespiratorPatient with active cough, diffuse coarse breath sounds and occasional rhonchi which seems to clear partially with cough. Diffuse wheeze and prolonged expiratory phase noted. Abdominal: Soft and nontender. No distention. No guarding no rebound Back:  There is no focal tenderness or step off there is no midline tenderness there are no lesions noted. there is no CVA tenderness Musculoskeletal: No lower extremity tenderness. No joint effusions, no  DVT signs strong distal pulses symmetric mild 1+dema Neurologic:  Normal speech  and language. No gross focal neurologic deficits are appreciated.  Skin:  Skin is warm, dry and intact. No rash noted. Psychiatric: Mood and affect are normal. Speech and behavior are normal.  ____________________________________________   LABS (all labs ordered are listed, but only abnormal results are displayed)  Labs Reviewed  CULTURE, BLOOD (ROUTINE X 2)  CULTURE, BLOOD (ROUTINE X 2)  BASIC METABOLIC PANEL  CBC WITH DIFFERENTIAL/PLATELET  TROPONIN I  BRAIN NATRIURETIC PEPTIDE   ____________________________________________  EKG  I personally interpreted any EKGs ordered by me or triage Right bundle-branch block sinus rhythm rate 87 no ST elevation myocardial infarction. ST changes likely consistent with bundle-branch block.  ____________________________________________  RADIOLOGY  I reviewed any imaging ordered by me or triage that were performed during my shift ____________________________________________   PROCEDURES  Procedure(s) performed: None  Critical Care performed: None  ____________________________________________   INITIAL IMPRESSION / ASSESSMENT AND PLAN / ED COURSE  Pertinent labs & imaging results that were available during my care of the patient were reviewed by me and considered in my medical decision making (see chart for details).  Patient with history of COPD presents with cough and wheeze. He is on home antibiotics already. He is on home steroids already. He is worsening with diffuse wheeze. His oxygen saturations are reassuring. We will treat him with albuterol has already had steroids. Will monitor his oxygen saturations her closely, I will obtain chest x-ray blood cultures, basic labs and reassess. Patient likely will need to be admitted given failure of outpatient treatment  ____________________________________________   FINAL CLINICAL IMPRESSION(S) / ED  DIAGNOSES  Final diagnoses:  None      This chart was dictated using voice recognition software.  Despite best efforts to proofread,  errors can occur which can change meaning.      Schuyler Amor, MD 04/12/15 0086  Schuyler Amor, MD 04/12/15 [place], MD 04/12/15 [phone]

## 2015-04-13 DIAGNOSIS — Z8249 Family history of ischemic heart disease and other diseases of the circulatory system: Secondary | ICD-10-CM | POA: Diagnosis not present

## 2015-04-13 DIAGNOSIS — I10 Essential (primary) hypertension: Secondary | ICD-10-CM | POA: Diagnosis not present

## 2015-04-13 DIAGNOSIS — Z7951 Long term (current) use of inhaled steroids: Secondary | ICD-10-CM | POA: Diagnosis not present

## 2015-04-13 DIAGNOSIS — E119 Type 2 diabetes mellitus without complications: Secondary | ICD-10-CM | POA: Diagnosis not present

## 2015-04-13 DIAGNOSIS — F4323 Adjustment disorder with mixed anxiety and depressed mood: Secondary | ICD-10-CM | POA: Diagnosis not present

## 2015-04-13 DIAGNOSIS — J180 Bronchopneumonia, unspecified organism: Secondary | ICD-10-CM | POA: Diagnosis not present

## 2015-04-13 DIAGNOSIS — Z9889 Other specified postprocedural states: Secondary | ICD-10-CM | POA: Diagnosis not present

## 2015-04-13 DIAGNOSIS — Z716 Tobacco abuse counseling: Secondary | ICD-10-CM | POA: Diagnosis not present

## 2015-04-13 DIAGNOSIS — Z7982 Long term (current) use of aspirin: Secondary | ICD-10-CM | POA: Diagnosis not present

## 2015-04-13 DIAGNOSIS — F17210 Nicotine dependence, cigarettes, uncomplicated: Secondary | ICD-10-CM | POA: Diagnosis not present

## 2015-04-13 DIAGNOSIS — I2510 Atherosclerotic heart disease of native coronary artery without angina pectoris: Secondary | ICD-10-CM | POA: Diagnosis not present

## 2015-04-13 DIAGNOSIS — J9621 Acute and chronic respiratory failure with hypoxia: Secondary | ICD-10-CM | POA: Diagnosis not present

## 2015-04-13 DIAGNOSIS — Z79899 Other long term (current) drug therapy: Secondary | ICD-10-CM | POA: Diagnosis not present

## 2015-04-13 DIAGNOSIS — R0600 Dyspnea, unspecified: Secondary | ICD-10-CM | POA: Diagnosis not present

## 2015-04-13 DIAGNOSIS — F17200 Nicotine dependence, unspecified, uncomplicated: Secondary | ICD-10-CM | POA: Diagnosis not present

## 2015-04-13 DIAGNOSIS — R1310 Dysphagia, unspecified: Secondary | ICD-10-CM | POA: Diagnosis not present

## 2015-04-13 DIAGNOSIS — J9601 Acute respiratory failure with hypoxia: Secondary | ICD-10-CM | POA: Diagnosis not present

## 2015-04-13 DIAGNOSIS — J441 Chronic obstructive pulmonary disease with (acute) exacerbation: Secondary | ICD-10-CM | POA: Diagnosis not present

## 2015-04-13 DIAGNOSIS — Z809 Family history of malignant neoplasm, unspecified: Secondary | ICD-10-CM | POA: Diagnosis not present

## 2015-04-13 DIAGNOSIS — G4700 Insomnia, unspecified: Secondary | ICD-10-CM | POA: Diagnosis not present

## 2015-04-13 DIAGNOSIS — E871 Hypo-osmolality and hyponatremia: Secondary | ICD-10-CM | POA: Diagnosis not present

## 2015-04-13 DIAGNOSIS — Z23 Encounter for immunization: Secondary | ICD-10-CM | POA: Diagnosis not present

## 2015-04-13 LAB — CBC
HEMATOCRIT: 36.7 % — AB (ref 40.0–52.0)
HEMOGLOBIN: 12.2 g/dL — AB (ref 13.0–18.0)
MCH: 26.1 pg (ref 26.0–34.0)
MCHC: 33.2 g/dL (ref 32.0–36.0)
MCV: 78.6 fL — AB (ref 80.0–100.0)
PLATELETS: 370 10*3/uL (ref 150–440)
RBC: 4.67 MIL/uL (ref 4.40–5.90)
RDW: 17 % — ABNORMAL HIGH (ref 11.5–14.5)
WBC: 16.2 10*3/uL — ABNORMAL HIGH (ref 3.8–10.6)

## 2015-04-13 LAB — GLUCOSE, CAPILLARY
GLUCOSE-CAPILLARY: 226 mg/dL — AB (ref 65–99)
GLUCOSE-CAPILLARY: 125 mg/dL — AB (ref 65–99)
Glucose-Capillary: 188 mg/dL — ABNORMAL HIGH (ref 65–99)
Glucose-Capillary: 141 mg/dL — ABNORMAL HIGH (ref 65–99)

## 2015-04-13 LAB — BASIC METABOLIC PANEL
Anion gap: 10 (ref 5–15)
BUN: 19 mg/dL (ref 6–20)
CALCIUM: 8.8 mg/dL — AB (ref 8.9–10.3)
CO2: 23 mmol/L (ref 22–32)
Chloride: 96 mmol/L — ABNORMAL LOW (ref 101–111)
Creatinine, Ser: 0.61 mg/dL (ref 0.61–1.24)
GFR calc Af Amer: >60 mL/min (ref >60)
GLUCOSE: 191 mg/dL — AB (ref 65–99)
Potassium: 4.2 mmol/L (ref 3.5–5.1)
Sodium: 129 mmol/L — ABNORMAL LOW (ref 135–145)

## 2015-04-13 MED ORDER — MORPHINE SULFATE (PF) 2 MG/ML IV SOLN
0.5000 mg | Freq: Four times a day (QID) | INTRAVENOUS | Status: DC | PRN
  Administered 2015-04-13 – 2015-04-16 (×9): 0.5 mg via INTRAVENOUS
  Filled 2015-04-13 (×10): qty 1

## 2015-04-13 MED ORDER — NYSTATIN 100000 UNIT/ML MT SUSP
5.0000 mL | Freq: Four times a day (QID) | OROMUCOSAL | Status: DC
  Administered 2015-04-13 – 2015-04-16 (×12): 500000 [IU] via ORAL
  Filled 2015-04-13 (×12): qty 5

## 2015-04-13 MED ORDER — IPRATROPIUM-ALBUTEROL 0.5-2.5 (3) MG/3ML IN SOLN
3.0000 mL | RESPIRATORY_TRACT | Status: DC | PRN
  Administered 2015-04-13 – 2015-04-14 (×2): 3 mL via RESPIRATORY_TRACT
  Filled 2015-04-13: qty 3

## 2015-04-13 MED ORDER — METHYLPREDNISOLONE SODIUM SUCC 40 MG IJ SOLR
40.0000 mg | Freq: Three times a day (TID) | INTRAMUSCULAR | Status: DC
  Administered 2015-04-13 – 2015-04-14 (×3): 40 mg via INTRAVENOUS
  Filled 2015-04-13 (×3): qty 1

## 2015-04-13 MED ORDER — LEVOFLOXACIN 750 MG PO TABS
750.0000 mg | ORAL_TABLET | Freq: Every day | ORAL | Status: DC
  Administered 2015-04-13 – 2015-04-16 (×4): 750 mg via ORAL
  Filled 2015-04-13 (×4): qty 1

## 2015-04-13 NOTE — Progress Notes (Addendum)
Date: 04/13/2015,   MRN# [id number] Bradley [name] 05/04/1950 Code Status:     Code Status Orders        Start     Ordered   04/12/15 1023  Full code   Continuous     04/12/15 1023    Code Status History    Date Active Date Inactive Code Status Order ID Comments User Context   10/08/2014  3:48 PM 10/11/2014  2:26 PM Full Code [id number]  Idelle Crouch, MD Inpatient    Advance Directive Documentation        Most Recent Value   Type of Advance Directive  Living will   Pre-existing out of facility DNR order (yellow form or pink MOST form)     "MOST" Form in Place?       Hosp day:'@LENGTHOFSTAYDAYS'$ @ Referring MD: '@ATDPROV'$ @         AdmissionWeight: 150 lb (68.04 kg)                 CurrentWeight: 150 lb (68.04 kg)  CC: Shortness of breath  HPI: This is 65 yr old, ex smoker, here with sob, wheezing, coughing. He get sob with minimum activity, when he eats. He seem to think when he put the oxygen on he get more sob, No frank choking spells or swallowing difficulty. No chest pain but " some chest tightness. " He is depressed post lost of multiple family members.    PMHX:   Past Medical History  Diagnosis Date  . COPD (chronic obstructive pulmonary disease) ([place])   . Diabetes mellitus without complication ([place])   . Coronary artery disease   . Hypertension    Surgical Hx:  Past Surgical History  Procedure Laterality Date  . Lung biopsy Right     2006   Family Hx:  Family History  Problem Relation Age of Onset  . Cancer Mother   . Heart attack Father   . Cancer Sister    Social Hx:   Social History  Substance Use Topics  . Smoking status: Current Every Day Smoker -- 0.75 packs/day  . Smokeless tobacco: Never Used  . Alcohol Use: No   Medication:    Home Medication:  No current outpatient prescriptions on file.  Current Medication: '@CURMEDTAB'$ @   Allergies:  Review of patient's allergies indicates no known allergies.  Review of Systems: Gen:  Denies  fever, sweats,  chills HEENT: Denies blurred vision, double vision, ear pain, eye pain, hearing loss, nose bleeds, sore throat Cvc:  No dizziness, chest pain or heaviness Resp:   Wheezing, coughing, sob present. Minimum activity and he is sob.  Gi: Denies swallowing difficulty, stomach pain, nausea or vomiting, diarrhea, constipation, bowel incontinence Gu:  Denies bladder incontinence, burning urine Ext:   No Joint pain, stiffness or swelling Skin: No skin rash, easy bruising or bleeding or hives Endoc:  No polyuria, polydipsia , polyphagia or weight change Psych: No depression, insomnia or hallucinations  Other:  All other systems negative  Physical Examination:   VS: BP 137/62 mmHg  Pulse 91  Temp(Src) 97.6 F (36.4 C) (Oral)  Resp 20  Ht '5\' 7"'$  (1.702 m)  Wt 150 lb (68.04 kg)  BMI 23.49 kg/m2  SpO2 94%  General Appearance: No distress  Neuro/psych: without focal findings, mental status, speech normal, alert and oriented, cranial nerves 2-12 intact, reflexes normal and symmetric, sensation grossly normal  HEENT: PERRLA, EOM intact, no ptosis, no other lesions noticed,  Neck: Supple, no stridor FundraisingSteps.no  wheezing, No rales  Minimum sputum Production:   Cardiovascular:  Normal S1,S2.  No m/r/g.  Abdominal aorta pulsation normal.    Abdomen:Benign, Soft, non-tender, No masses, hepatosplenomegaly, No lymphadenopathy Endoc: No evident thyromegaly, no signs of acromegaly or Cushing features Skin:   warm, no rashes, no ecchymosis  Extremities: normal, no cyanosis, clubbing, no edema, warm with normal capillary refill. Other findings:   Labs results:   Recent Labs     04/12/15  0744  04/12/15  1032  04/13/15  0556  HGB  13.3  12.6*  12.2*  HCT  40.5  38.3*  36.7*  MCV  79.5*  78.8*  78.6*  WBC  15.0*  13.1*  16.2*  BUN  18   --   19  CREATININE  0.76  0.68  0.61  GLUCOSE  142*   --   191*  CALCIUM  8.6*   --   8.8*  ,    No results for input(s): PH in the last 72  hours.  Invalid input(s): PCO2, PO2, BASEEXCESS, BASEDEFICITE, TFT  Culture results:     Rad results:   EXAM: CHEST 2 VIEW  COMPARISON: 07/14/2014; correlation CT chest 10/08/2014  FINDINGS: Normal heart size, mediastinal contours, and pulmonary vascularity.  Atherosclerotic calcification aorta.  Emphysematous and bronchitic changes compatible with severe COPD.  Extensive scarring greatest in RIGHT upper lobe.  New nodular density LEFT lateral lung base versus superimposed artifact.  From its position, I doubt this represents a nipple shadow.  On lateral view, several EKG leads identified at the lower anterior chest, uncertain if the new nodular density could be related to an EKG lead but pulmonary nodule not excluded.  Blunting of RIGHT costophrenic angle appears stable.  Atelectasis RIGHT mid lung posteriorly appears unchanged with surgical clips at region likely a combination of postoperative and post inflammatory changes.  No definite acute infiltrate, pleural effusion or pneumothorax.  IMPRESSION: Severe COPD with scarring in RIGHT upper lobe and posterior mid RIGHT lung.  New nodular density at LEFT lung base is of uncertain etiology, not felt to represent a nipple shadow but potentially could be related to a superimposed EKG lead; repeat PA chest radiograph with nipple markers and removal of all EKG leads recommended to exclude pulmonary nodule.   Electronically Signed  By: Lavonia Dana M.D.  Assessment and Plan: COPD ex smoker, with acute on chronic respiratory failure. Last week he had a chest xray that looked like a pneumonia with chronic RUL scarring.  Was treated as such with clearing of the chest xray. ? Nipple shadow on the left side  Resume home copd regimen Solumedrol Prn 02 Speech eval for aspiration dvy prophylaxis Repeat xray with nipple markers Suspect he will need portable oxygen Complete 10 day course of levaquin   Following with you   I have personally obtained a history, examined the patient, evaluated laboratory and imaging results, formulated the assessment and plan and placed orders.  The Patient requires high complexity decision making for assessment and support, frequent evaluation and titration of therapies, application of advanced monitoring technologies and extensive interpretation of multiple databases.   Herbon Fleming,M.D. Pulmonary & Critical care Medicine [facility]

## 2015-04-13 NOTE — Progress Notes (Signed)
[name] NAME: Bradley [name]   MR#:  [id number]  DATE OF BIRTH:  10/04/1950  SUBJECTIVE:   still with wheezing and shortness of breath.   REVIEW OF SYSTEMS:    Review of Systems  Constitutional: Negative for fever, chills and malaise/fatigue.  HENT: Negative for sore throat.   Eyes: Negative for blurred vision.  Respiratory: Positive for cough and shortness of breath. Negative for hemoptysis and wheezing.   Cardiovascular: Negative for chest pain, palpitations and leg swelling.  Gastrointestinal: Negative for nausea, vomiting, abdominal pain, diarrhea and blood in stool.  Genitourinary: Negative for dysuria.  Musculoskeletal: Negative for back pain.  Neurological: Positive for weakness. Negative for dizziness, tremors and headaches.  Endo/Heme/Allergies: Does not bruise/bleed easily.    Tolerating Diet:yes      DRUG ALLERGIES:  No Known Allergies  VITALS:  Blood pressure 132/75, pulse 97, temperature 97.9 F (36.6 C), temperature source Oral, resp. rate 22, height '5\' 7"'$  (1.702 m), weight 68.04 kg (150 lb), SpO2 95 %.  PHYSICAL EXAMINATION:   Physical Exam  Constitutional: He is oriented to person, place, and time and well-developed, well-nourished, and in no distress. No distress.  HENT:  Head: Normocephalic.  Eyes: No scleral icterus.  Neck: Normal range of motion. Neck supple. No JVD present. No tracheal deviation present.  Cardiovascular: Normal rate, regular rhythm and normal heart sounds.  Exam reveals no gallop and no friction rub.   No murmur heard. Pulmonary/Chest: Effort normal. No respiratory distress. He has wheezes. He has no rales. He exhibits no tenderness.  Abdominal: Soft. Bowel sounds are normal. He exhibits no distension and no mass. There is no tenderness. There is no rebound and no guarding.  Musculoskeletal: Normal range of motion. He exhibits no edema.  Neurological: He is alert and  oriented to person, place, and time.  Skin: Skin is warm. No rash noted. No erythema.  Psychiatric: Affect and judgment normal.      LABORATORY PANEL:   CBC  Recent Labs Lab 04/13/15 0556  WBC 16.2*  HGB 12.2*  HCT 36.7*  PLT 370   ------------------------------------------------------------------------------------------------------------------  Chemistries   Recent Labs Lab 04/13/15 0556  NA 129*  K 4.2  CL 96*  CO2 23  GLUCOSE 191*  BUN 19  CREATININE 0.61  CALCIUM 8.8*   ------------------------------------------------------------------------------------------------------------------  Cardiac Enzymes  Recent Labs Lab 04/12/15 0744  TROPONINI <0.03   ------------------------------------------------------------------------------------------------------------------  RADIOLOGY:  Dg Chest 2 View  04/12/2015  CLINICAL DATA:  Bronchitis, sick for 8 days, on prednisone and antibiotics, shortness of breath, history hypertension, diabetes mellitus, COPD/emphysema, coronary artery disease, former smoker EXAM: CHEST  2 VIEW COMPARISON:  07/14/2014; correlation CT chest 10/08/2014 FINDINGS: Normal heart size, mediastinal contours, and pulmonary vascularity. Atherosclerotic calcification aorta. Emphysematous and bronchitic changes compatible with severe COPD. Extensive scarring greatest in RIGHT upper lobe. New nodular density LEFT lateral lung base versus superimposed artifact. From its position, I doubt this represents a nipple shadow. On lateral view, several EKG leads identified at the lower anterior chest, uncertain if the new nodular density could be related to an EKG lead but pulmonary nodule not excluded. Blunting of RIGHT costophrenic angle appears stable. Atelectasis RIGHT mid lung posteriorly appears unchanged with surgical clips at region likely a combination of postoperative and post inflammatory changes. No definite acute infiltrate, pleural effusion or pneumothorax.  IMPRESSION: Severe COPD with scarring in RIGHT upper lobe and posterior mid RIGHT lung. New nodular density at  LEFT lung base is of uncertain etiology, not felt to represent a nipple shadow but potentially could be related to a superimposed EKG lead; repeat PA chest radiograph with nipple markers and removal of all EKG leads recommended to exclude pulmonary nodule. Electronically Signed   By: Lavonia Dana M.D.   On: 04/12/2015 08:12     ASSESSMENT AND PLAN:   65 year old male with a history of CAD, essential hypertension and COPD who presents with acute hypoxic respiratory failure.  1.. Acute hypoxic respiratory failure: Due to COPD exacerbation. Plan as outlined below.  2. COPD exacerbation: Continue IV steroids, nebulizer treatment and Levaquin. Wean oxygen as tolerated.  3. Hyponatremia: Sodium remains at 129. I am suspecting this is due to COPD. Continue IV fluids.  4. Essential hypertension: Blood pressure is acceptable. Continue enalapril.  5. History of CAD: Continue aspirin, Plavix, Zocor enalapriland metoprolol. 6. Diabetes: Continue metformin, sliding scale insulin and ADA diet. 7. Tobacco dependence: Patient was counseled on admission by admitting physician.  Management plans discussed with the patient and he is in agreement.  CODE STATUS: FULL  TOTAL TIME TAKING CARE OF THIS PATIENT: 30 minutes.     POSSIBLE D/C 1-2 days, DEPENDING ON CLINICAL CONDITION.   Yuriko Portales M.D on 04/13/2015 at 9:39 AM  Between 7am to 6pm - Pager - [phone] After 6pm go to [email or web address] - password [email or web address] [place]  Office  [phone]  CC: Primary care physician; Valera Castle, MD  Note: This dictation was prepared with Dragon dictation along with smaller phrase technology. Any transcriptional errors that result from this process are unintentional.

## 2015-04-13 NOTE — Progress Notes (Signed)
Inpatient Diabetes Program Recommendations  AACE/ADA: New Consensus Statement on Inpatient Glycemic Control (2015)  Target Ranges:  Prepandial:   less than 140 mg/dL      Peak postprandial:   less than 180 mg/dL (1-2 hours)      Critically ill patients:  140 - 180 mg/dL   Review of Glycemic Control  [name], Bradley [name] (MRN [id number]) as of 04/13/2015 13:13  Ref. Range 04/12/2015 12:00 04/12/2015 16:06 04/12/2015 21:20 04/13/2015 08:03 04/13/2015 11:54  Glucose-Capillary Latest Ref Range: 65-99 mg/dL 211 (H) 158 (H) 165 (H) 188 (H) 141 (H)    Diabetes history: Type 2. Last A1C was 12/28/14- 6.5%  Outpatient Diabetes medications: Metformin '500mg'$  bid  Current orders for Inpatient glycemic control: Glucophage '500mg'$  bid, Novolog 0-9 units tid -prednisone '40mg'$  tid  Inpatient Diabetes Program Recommendations:  Agree with current diabetes medications- consider ordering an A1C.  Per ADA recommendations "consider performing an A1C on all patients with diabetes or hypreglycemia admitted to the hospital if not performed in the prior 3 months".  Gentry Fitz, RN, BA, MHA, CDE Diabetes Coordinator Inpatient Diabetes Program  [phone] (Team Pager) [phone] ([place]) 04/13/2015 1:16 PM

## 2015-04-14 ENCOUNTER — Inpatient Hospital Stay: Payer: PPO

## 2015-04-14 DIAGNOSIS — E871 Hypo-osmolality and hyponatremia: Secondary | ICD-10-CM | POA: Diagnosis not present

## 2015-04-14 DIAGNOSIS — F4323 Adjustment disorder with mixed anxiety and depressed mood: Secondary | ICD-10-CM

## 2015-04-14 DIAGNOSIS — J9601 Acute respiratory failure with hypoxia: Secondary | ICD-10-CM | POA: Diagnosis not present

## 2015-04-14 DIAGNOSIS — E119 Type 2 diabetes mellitus without complications: Secondary | ICD-10-CM | POA: Diagnosis not present

## 2015-04-14 DIAGNOSIS — J441 Chronic obstructive pulmonary disease with (acute) exacerbation: Secondary | ICD-10-CM | POA: Diagnosis not present

## 2015-04-14 DIAGNOSIS — F4321 Adjustment disorder with depressed mood: Secondary | ICD-10-CM

## 2015-04-14 DIAGNOSIS — R0602 Shortness of breath: Secondary | ICD-10-CM | POA: Diagnosis not present

## 2015-04-14 DIAGNOSIS — G4700 Insomnia, unspecified: Secondary | ICD-10-CM

## 2015-04-14 DIAGNOSIS — J180 Bronchopneumonia, unspecified organism: Secondary | ICD-10-CM | POA: Diagnosis not present

## 2015-04-14 DIAGNOSIS — I10 Essential (primary) hypertension: Secondary | ICD-10-CM | POA: Diagnosis not present

## 2015-04-14 LAB — BASIC METABOLIC PANEL
ANION GAP: 9 (ref 5–15)
BUN: 20 mg/dL (ref 6–20)
CALCIUM: 9.1 mg/dL (ref 8.9–10.3)
CO2: 25 mmol/L (ref 22–32)
Chloride: 97 mmol/L — ABNORMAL LOW (ref 101–111)
Creatinine, Ser: 0.56 mg/dL — ABNORMAL LOW (ref 0.61–1.24)
Glucose, Bld: 143 mg/dL — ABNORMAL HIGH (ref 65–99)
POTASSIUM: 4.4 mmol/L (ref 3.5–5.1)
SODIUM: 131 mmol/L — AB (ref 135–145)

## 2015-04-14 LAB — CBC
HCT: 38.7 % — ABNORMAL LOW (ref 40.0–52.0)
Hemoglobin: 12.7 g/dL — ABNORMAL LOW (ref 13.0–18.0)
MCH: 26.1 pg (ref 26.0–34.0)
MCHC: 32.8 g/dL (ref 32.0–36.0)
MCV: 79.6 fL — AB (ref 80.0–100.0)
PLATELETS: 375 10*3/uL (ref 150–440)
RBC: 4.86 MIL/uL (ref 4.40–5.90)
RDW: 17 % — AB (ref 11.5–14.5)
WBC: 18.2 10*3/uL — AB (ref 3.8–10.6)

## 2015-04-14 LAB — GLUCOSE, CAPILLARY
GLUCOSE-CAPILLARY: 138 mg/dL — AB (ref 65–99)
GLUCOSE-CAPILLARY: 158 mg/dL — AB (ref 65–99)
GLUCOSE-CAPILLARY: 103 mg/dL — AB (ref 65–99)
GLUCOSE-CAPILLARY: 164 mg/dL — AB (ref 65–99)

## 2015-04-14 MED ORDER — METHYLPREDNISOLONE SODIUM SUCC 40 MG IJ SOLR
40.0000 mg | Freq: Two times a day (BID) | INTRAMUSCULAR | Status: DC
  Administered 2015-04-14 – 2015-04-16 (×4): 40 mg via INTRAVENOUS
  Filled 2015-04-14 (×4): qty 1

## 2015-04-14 MED ORDER — MIRTAZAPINE 15 MG PO TABS
15.0000 mg | ORAL_TABLET | Freq: Every day | ORAL | Status: DC
  Administered 2015-04-14 – 2015-04-15 (×2): 15 mg via ORAL
  Filled 2015-04-14 (×2): qty 1

## 2015-04-14 MED ORDER — INSULIN ASPART 100 UNIT/ML ~~LOC~~ SOLN: 0.0000 [IU] | Freq: Every day | SUBCUTANEOUS | Status: DC

## 2015-04-14 MED ORDER — LORAZEPAM 1 MG PO TABS
1.0000 mg | ORAL_TABLET | Freq: Two times a day (BID) | ORAL | Status: DC | PRN
  Administered 2015-04-14 – 2015-04-15 (×2): 1 mg via ORAL
  Filled 2015-04-14 (×2): qty 1

## 2015-04-14 MED ORDER — CETYLPYRIDINIUM CHLORIDE 0.05 % MT LIQD
7.0000 mL | Freq: Two times a day (BID) | OROMUCOSAL | Status: DC
  Administered 2015-04-14 – 2015-04-16 (×5): 7 mL via OROMUCOSAL

## 2015-04-14 MED ORDER — INSULIN ASPART 100 UNIT/ML ~~LOC~~ SOLN
0.0000 [IU] | Freq: Three times a day (TID) | SUBCUTANEOUS | Status: DC
  Administered 2015-04-14: 13:00:00 2 [IU] via SUBCUTANEOUS
  Administered 2015-04-15: 5 [IU] via SUBCUTANEOUS
  Administered 2015-04-16 (×2): 2 [IU] via SUBCUTANEOUS
  Administered 2015-04-16: 12:00:00 3 [IU] via SUBCUTANEOUS
  Filled 2015-04-14: qty 3
  Filled 2015-04-14: qty 5
  Filled 2015-04-14 (×3): qty 2

## 2015-04-14 NOTE — Progress Notes (Signed)
[facility] [place] NAME: Bradley [name]   MR#:  [id number]  DATE OF BIRTH:  [birth year]  SUBJECTIVE:   Shortness of breath has somewhat improved. He still has wheezing. Cough is improved.   REVIEW OF SYSTEMS:    Review of Systems  Constitutional: Negative for fever, chills and malaise/fatigue.  HENT: Negative for sore throat.   Eyes: Negative for blurred vision.  Respiratory: Positive for shortness of breath and wheezing. Negative for cough and hemoptysis.   Cardiovascular: Negative for chest pain, palpitations and leg swelling.  Gastrointestinal: Negative for nausea, vomiting, abdominal pain, diarrhea and blood in stool.  Genitourinary: Negative for dysuria.  Musculoskeletal: Negative for back pain.  Neurological: Negative for dizziness, tremors and headaches.  Endo/Heme/Allergies: Does not bruise/bleed easily.    Tolerating Diet:yes      DRUG ALLERGIES:  No Known Allergies  VITALS:  Blood pressure 134/76, pulse 88, temperature 97.5 F (36.4 C), temperature source Oral, resp. rate 18, height '5\' 7"'$  (1.702 m), weight 68.04 kg (150 lb), SpO2 91 %.  PHYSICAL EXAMINATION:   Physical Exam  Constitutional: He is oriented to person, place, and time and well-developed, well-nourished, and in no distress. No distress.  HENT:  Head: Normocephalic.  Eyes: No scleral icterus.  Neck: Normal range of motion. Neck supple. No JVD present. No tracheal deviation present.  Cardiovascular: Normal rate, regular rhythm and normal heart sounds.  Exam reveals no gallop and no friction rub.   No murmur heard. Pulmonary/Chest: Effort normal. No respiratory distress. He has wheezes. He has no rales. He exhibits no tenderness.  Abdominal: Soft. Bowel sounds are normal. He exhibits no distension and no mass. There is no tenderness. There is no rebound and no guarding.  Musculoskeletal: Normal range of motion. He exhibits no edema.  Neurological:  He is alert and oriented to person, place, and time.  Skin: Skin is warm. No rash noted. No erythema.  Psychiatric: Affect and judgment normal.      LABORATORY PANEL:   CBC  Recent Labs Lab 04/14/15 0752  WBC 18.2*  HGB 12.7*  HCT 38.7*  PLT 375   ------------------------------------------------------------------------------------------------------------------  Chemistries   Recent Labs Lab 04/14/15 0752  NA 131*  K 4.4  CL 97*  CO2 25  GLUCOSE 143*  BUN 20  CREATININE 0.56*  CALCIUM 9.1   ------------------------------------------------------------------------------------------------------------------  Cardiac Enzymes  Recent Labs Lab 04/12/15 0744  TROPONINI <0.03   ------------------------------------------------------------------------------------------------------------------  RADIOLOGY:  Dg Chest 1 View  04/14/2015  CLINICAL DATA:  Shortness of breath, prior RIGHT lobectomy for tumor in 2006, COPD, hypertension, diabetes mellitus, coronary artery disease EXAM: CHEST 1 VIEW COMPARISON:  Portable exam 0746 hours compared to 04/12/2015 FINDINGS: Normal heart size, mediastinal contours, and pulmonary vascularity. Emphysematous and bronchitic changes consistent with COPD. Nodular density questioned at LEFT base on previous exam no longer identified likely represented a superimposed artifact. Scattered RIGHT lung scarring. Chronic interstitial prominence with questionable mild atelectasis or developing infiltrate at LEFT lower lobe. Remaining lungs clear. Question small subpulmonic RIGHT pleural effusion. No pneumothorax or acute osseous findings. Stent identified in LEFT cervical region. IMPRESSION: Resolution of previously identified nodular density suspect external artifact which has been removed. Severe COPD changes and scarring with probable small subpulmonic RIGHT pleural effusion and mild atelectasis versus developing infiltrate at LEFT lower lobe.  Electronically Signed   By: Lavonia Dana M.D.   On: 04/14/2015 08:08     ASSESSMENT AND PLAN:   65 year old  male with a history of CAD, essential hypertension and COPD who presents with acute hypoxic respiratory failure.  1.. Acute hypoxic respiratory failure: Due to COPD exacerbation. Plan as outlined below.  2. COPD exacerbation: Continue to wean IV steroids as tolerated Continue nebulizer treatment and Levaquin. He is currently off of oxygen Appreciate Dr. Raul Del consult.  3. Hyponatremia: Sodium level improved with IV fluids.  4. Essential hypertension: Blood pressure is acceptable. Continue enalapril.  5. History of CAD: Continue aspirin, Plavix, Zocor enalapril and metoprolol. 6. Diabetes: Continue metformin, sliding scale insulin and ADA diet. 7. Tobacco dependence: Patient was counseled on admission by admitting physician.  Management plans discussed with the patient and he is in agreement. Plan of care discussed with daughter CODE STATUS: FULL  TOTAL TIME TAKING CARE OF THIS PATIENT: 25 minutes.     POSSIBLE D/C Sunday , DEPENDING ON CLINICAL CONDITION.   Sollie Vultaggio M.D on 04/14/2015 at 11:05 AM  Between 7am to 6pm - Pager - [phone] After 6pm go to [email or web address] - password [email or web address] [facility]  Office  [phone]  CC: Primary care physician; Valera Castle, MD  Note: This dictation was prepared with Dragon dictation along with smaller phrase technology. Any transcriptional errors that result from this process are unintentional.

## 2015-04-14 NOTE — Progress Notes (Signed)
Took over pt care at 1515, pt alert and oriented, daughter at bedside, no distress noted, voices any needs

## 2015-04-14 NOTE — Evaluation (Addendum)
Clinical/Bedside Swallow Evaluation Patient Details  Name: Bradley [name] MRN: [id number] Date of Birth: 1950/12/02  Today's Date: 04/14/2015 Time: SLP Start Time (ACUTE ONLY): 1000 SLP Stop Time (ACUTE ONLY): 1100 SLP Time Calculation (min) (ACUTE ONLY): 60 min  Past Medical History:  Past Medical History  Diagnosis Date  . COPD (chronic obstructive pulmonary disease) ([place])   . Diabetes mellitus without complication ([place])   . Coronary artery disease   . Hypertension    Past Surgical History:  Past Surgical History  Procedure Laterality Date  . Lung biopsy Right     2006   HPI:  Pt is a 65 y.o. male with a known history of COPD, CAD, DM, hypertension, and S/P lobectomy of right lung - lower lobe post lung biopsy in 2006 who for last 6-7 days started having chest tightness and cough. He spoke to his pulmonologist Dr. Raul Del - he prescribed oral steroid and antibiotic which she took for 5 days - still continued to have cough and shortness of breath so came to emergency room. Pt endorsed respiratory issues for ~2 years w/ SOB w/ any exertion. Noted CXR presentation. Pt denies any reflux-like symptoms but stated "food stops right here" pointing to his sternal notch area(though this is inconsistent per his report). Pt endorses change in vocal quality - more gravely in the past year. Of note, per [facility] charting, pt does have a dx of GERD and is currently on a PPI during this admission.    Assessment / Plan / Recommendation Clinical Impression  Pt appeared to adequately tolerate trials of thin liquids and purees/soft solids w/ no overt s/s of aspiration noted during intake; no decline in vocal quality - noted pt felt he was min. SOB w/ the exertion so he put on his [place] O2 for support. Pt's RR did not appear to increase from baseline. Oral phase appeared grossly wfl for bolus management and transfer; oral clear was appropriate. Pt fed self and stated he has to eat "slowly" to not become SOB. He stated  he feels "anxious" at times when he feels "like it won't go down" or if coughing occurs. Pt is c/o a "soreness" in his throat and is being treated for a "yeast infection" per pt/Dtr. Unsure if pt's s/s of vocal quality change and discomfort when eating/swallowing are stemming from reflux issues or from the "yeast infection", but would rec. f/u w/ ENT and GI for further assessment. Pt is acknowledging he could benefit from "something for my anxiety" - a MD has suggested this to him per Dtr/pt. Rec. continue w/ current diet as ordered w/ aspiration and Reflux precautions; rest breaks during meals; meds whole in puree if easier to swallow.     Aspiration Risk   (reduced following precautions) Pt is at increased risk from Reflux material.    Diet Recommendation  regular/mech soft (meats cut and moistened); thin liquids (less ice in drinks); aspiration and Reflux precautions.  Medication Administration: Whole meds with puree    Other  Recommendations Recommended Consults: Consider ENT evaluation;Consider GI evaluation Oral Care Recommendations: Oral care BID;Patient independent with oral care   Follow up Recommendations  None    Frequency and Duration            Prognosis Prognosis for Safe Diet Advancement: Good      Swallow Study   General Date of Onset: 04/12/15 HPI: Pt is a 65 y.o. male with a known history of COPD, CAD, DM, hypertension, and S/P lobectomy of right  lung - lower lobe post lung biopsy in 2006 who for last 6-7 days started having chest tightness and cough. He spoke to his pulmonologist Dr. Raul Del - he prescribed oral steroid and antibiotic which she took for 5 days - still continued to have cough and shortness of breath so came to emergency room. Pt endorsed respiratory issues for ~2 years w/ SOB w/ any exertion. Noted CXR presentation. Pt denies any reflux-like symptoms but stated "food stops right here" pointing to his sternal notch area(though this is inconsistent per his  report). Pt endorses change in vocal quality - more gravely in the past year. Of note, per [facility] charting, pt does have a dx of GERD and is currently on a PPI during this admission.  Type of Study: Bedside Swallow Evaluation Previous Swallow Assessment: none Diet Prior to this Study: Regular;Thin liquids (cuts his foods "small") Temperature Spikes Noted: No (wbc 18.2) Respiratory Status: Nasal cannula (uses [place] when he feels SOB("I'm on the border")) History of Recent Intubation: No Behavior/Cognition: Alert;Cooperative;Pleasant mood Oral Cavity Assessment: Dry (c/o constant dryness using O2) Oral Care Completed by SLP: Recent completion by staff Oral Cavity - Dentition: Missing dentition;Poor condition (missing most) Vision: Functional for self-feeding Self-Feeding Abilities: Able to feed self Patient Positioning: Upright in bed Baseline Vocal Quality:  (gravely) Volitional Cough: Strong Volitional Swallow: Able to elicit    Oral/Motor/Sensory Function Overall Oral Motor/Sensory Function: Within functional limits   Ice Chips Ice chips: Not tested   Thin Liquid Thin Liquid: Within functional limits Presentation: Straw (~3 ozs)    Nectar Thick Nectar Thick Liquid: Not tested   Honey Thick Honey Thick Liquid: Not tested   Puree Puree: Within functional limits Presentation: Self Fed;Spoon (~3-4 ozs)   Solid   GO   Solid: Within functional limits (soft solid) Presentation: Self Fed (6 trials - small, single pieces) Other Comments: miostened       Orinda Kenner, MS, CCC-SLP  Tonya Carlile 04/14/2015,11:54 AM

## 2015-04-14 NOTE — Consult Note (Signed)
[facility] Face-to-Face Psychiatry Consult   Reason for Consult:  Consult for 65 year old man with some depressive symptoms and multiple medical problems including new onset severe shortness of breath. Referring Physician:  Mody Patient Identification: Bradley [name] MRN:  [id number] Principal Diagnosis: Adjustment disorder with mixed anxiety and depressed mood Diagnosis:   Patient Active Problem List   Diagnosis Date Noted  . Adjustment disorder with mixed anxiety and depressed mood [F43.23] 04/14/2015  . Insomnia [G47.00] 04/14/2015  . Grief [F43.21] 04/14/2015  . Hyponatremia [E87.1] 04/12/2015  . COPD exacerbation ([place]) [J44.1] 10/08/2014  . CAP (community acquired pneumonia) [J18.9] 10/08/2014  . Hypogammaglobulinemia ([place]) [D80.1] 08/17/2014  . Leukocytosis [D72.829] 08/03/2014  . Recurrent infections [B99.9] 08/03/2014    Total Time spent with patient: 1 hour  Subjective:   Bradley [name] is a 65 y.o. male patient admitted with "well I've had 3 deaths in the family".  HPI:  Patient interviewed. His daughter was also present. Chart reviewed labs reviewed. 65 year old man currently in the hospital for shortness of breath and COPD which seems to be taking its time in resolving. Pulmonologist requested psychiatry consult. Patient admits that his mood has been down. He had 3 deaths in his family. His daughter died over a year ago and then this past December 06, 2022 his wife and son-in-law died within a week of each other. Since then the patient says he has been sleeping very poorly. Rarely sleeps more than an hour or 2 at night. Feels tired during the day. On the other hand he says his appetite has been fine and he has not lost weight. 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 He is able to articulate some positive plans for the future. Patient denies having any suicidal thoughts at all. He is not drinking or abusing any drugs. No psychotic symptoms  reported. Patient is not currently getting any kind of specific mental health treatment.  Social history: Currently living alone. It sounds like there is extended family including his brother and his daughter who are still around to see him. Nevertheless obviously a devastating loss in the fall. 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 Both he and his daughter report that typically he was a very physically active man and it bothers him a lot to be sick and not be able to do things.  Medical history: Patient has what seems like pretty bad COPD. Uses a nebulizer and several inhalers at home. Now appears to have some other complicating issues including hyponatremia which is being worked up area  Substance abuse history: Denies any alcohol abuse. Denies drug abuse no history of substance abuse except tobacco and fortunately he has quit smoking.  Past Psychiatric History: Patient denies any past psychiatric history. No psychiatric hospitalizations. No suicide attempts. No history of psychiatric medication that he knows of.  Risk to Self: Is patient at risk for suicide?: No Risk to Others:   Prior Inpatient Therapy:   Prior Outpatient Therapy:    Past Medical History:  Past Medical History  Diagnosis Date  . COPD (chronic obstructive pulmonary disease) ([place])   . Diabetes mellitus without complication ([place])   . Coronary artery disease   . Hypertension     Past Surgical History  Procedure Laterality Date  . Lung biopsy Right     2006   Family History:  Family History  Problem Relation Age of Onset  . Cancer Mother   .  Heart attack Father   . Cancer Sister    Family Psychiatric  History: Patient is not aware of there being any family history of mental health problems. Social History:  History  Alcohol Use No     History  Drug Use No    Social History   Social History  . Marital Status: Married    Spouse Name: N/A  . Number of Children: N/A  .  Years of Education: N/A   Social History Main Topics  . Smoking status: Current Every Day Smoker -- 0.75 packs/day  . Smokeless tobacco: Never Used  . Alcohol Use: No  . Drug Use: No  . Sexual Activity: Not Asked   Other Topics Concern  . None   Social History Narrative   Additional Social History:    Allergies:  No Known Allergies  Labs:  Results for orders placed or performed during the hospital encounter of 04/12/15 (from the past 48 hour(s))  Glucose, capillary     Status: Abnormal   Collection Time: 04/12/15  9:20 PM  Result Value Ref Range   Glucose-Capillary 165 (H) 65 - 99 mg/dL   Comment 1 Notify RN   Basic metabolic panel     Status: Abnormal   Collection Time: 04/13/15  5:56 AM  Result Value Ref Range   Sodium 129 (L) 135 - 145 mmol/L   Potassium 4.2 3.5 - 5.1 mmol/L   Chloride 96 (L) 101 - 111 mmol/L   CO2 23 22 - 32 mmol/L   Glucose, Bld 191 (H) 65 - 99 mg/dL   BUN 19 6 - 20 mg/dL   Creatinine, Ser 0.61 0.61 - 1.24 mg/dL   Calcium 8.8 (L) 8.9 - 10.3 mg/dL   GFR calc non Af Amer >60 >60 mL/min   GFR calc Af Amer >60 >60 mL/min    Comment: (NOTE) The eGFR has been calculated using the CKD EPI equation. This calculation has not been validated in all clinical situations. eGFR's persistently <60 mL/min signify possible Chronic Kidney Disease.    Anion gap 10 5 - 15  CBC     Status: Abnormal   Collection Time: 04/13/15  5:56 AM  Result Value Ref Range   WBC 16.2 (H) 3.8 - 10.6 K/uL   RBC 4.67 4.40 - 5.90 MIL/uL   Hemoglobin 12.2 (L) 13.0 - 18.0 g/dL   HCT 36.7 (L) 40.0 - 52.0 %   MCV 78.6 (L) 80.0 - 100.0 fL   MCH 26.1 26.0 - 34.0 pg   MCHC 33.2 32.0 - 36.0 g/dL   RDW 17.0 (H) 11.5 - 14.5 %   Platelets 370 150 - 440 K/uL  Glucose, capillary     Status: Abnormal   Collection Time: 04/13/15  8:03 AM  Result Value Ref Range   Glucose-Capillary 188 (H) 65 - 99 mg/dL  Glucose, capillary     Status: Abnormal   Collection Time: 04/13/15 11:54 AM  Result  Value Ref Range   Glucose-Capillary 141 (H) 65 - 99 mg/dL  Glucose, capillary     Status: Abnormal   Collection Time: 04/13/15  4:51 PM  Result Value Ref Range   Glucose-Capillary 226 (H) 65 - 99 mg/dL  Glucose, capillary     Status: Abnormal   Collection Time: 04/13/15 10:08 PM  Result Value Ref Range   Glucose-Capillary 125 (H) 65 - 99 mg/dL  CBC     Status: Abnormal   Collection Time: 04/14/15  7:52 AM  Result Value Ref Range  WBC 18.2 (H) 3.8 - 10.6 K/uL   RBC 4.86 4.40 - 5.90 MIL/uL   Hemoglobin 12.7 (L) 13.0 - 18.0 g/dL   HCT 38.7 (L) 40.0 - 52.0 %   MCV 79.6 (L) 80.0 - 100.0 fL   MCH 26.1 26.0 - 34.0 pg   MCHC 32.8 32.0 - 36.0 g/dL   RDW 17.0 (H) 11.5 - 14.5 %   Platelets 375 150 - 440 K/uL  Basic metabolic panel     Status: Abnormal   Collection Time: 04/14/15  7:52 AM  Result Value Ref Range   Sodium 131 (L) 135 - 145 mmol/L   Potassium 4.4 3.5 - 5.1 mmol/L   Chloride 97 (L) 101 - 111 mmol/L   CO2 25 22 - 32 mmol/L   Glucose, Bld 143 (H) 65 - 99 mg/dL   BUN 20 6 - 20 mg/dL   Creatinine, Ser 0.56 (L) 0.61 - 1.24 mg/dL   Calcium 9.1 8.9 - 10.3 mg/dL   GFR calc non Af Amer >60 >60 mL/min   GFR calc Af Amer >60 >60 mL/min    Comment: (NOTE) The eGFR has been calculated using the CKD EPI equation. This calculation has not been validated in all clinical situations. eGFR's persistently <60 mL/min signify possible Chronic Kidney Disease.    Anion gap 9 5 - 15  Glucose, capillary     Status: Abnormal   Collection Time: 04/14/15  8:29 AM  Result Value Ref Range   Glucose-Capillary 138 (H) 65 - 99 mg/dL  Glucose, capillary     Status: Abnormal   Collection Time: 04/14/15 11:32 AM  Result Value Ref Range   Glucose-Capillary 158 (H) 65 - 99 mg/dL    Current Facility-Administered Medications  Medication Dose Route Frequency Provider Last Rate Last Dose  . 0.9 %  sodium chloride infusion   Intravenous Continuous Vaughan Basta, MD 75 mL/hr at 04/14/15 0220     . antiseptic oral rinse (CPC / CETYLPYRIDINIUM CHLORIDE 0.05%) solution 7 mL  7 mL Mouth Rinse BID Bettey Costa, MD   7 mL at 04/14/15 1624  . aspirin chewable tablet 81 mg  81 mg Oral Daily Vaughan Basta, MD   81 mg at 04/14/15 0924  . budesonide (PULMICORT) nebulizer solution 0.25 mg  0.25 mg Nebulization BID Vaughan Basta, MD   0.25 mg at 04/14/15 0755  . clopidogrel (PLAVIX) tablet 75 mg  75 mg Oral Daily Vaughan Basta, MD   75 mg at 04/14/15 0924  . enalapril (VASOTEC) tablet 10 mg  10 mg Oral Daily Vaughan Basta, MD   10 mg at 04/14/15 3112  . fluticasone (FLONASE) 50 MCG/ACT nasal spray 2 spray  2 spray Each Nare Daily Vaughan Basta, MD   2 spray at 04/14/15 0921  . heparin injection 5,000 Units  5,000 Units Subcutaneous 3 times per day Vaughan Basta, MD   5,000 Units at 04/14/15 0601  . insulin aspart (novoLOG) injection 0-15 Units  0-15 Units Subcutaneous TID WC Bettey Costa, MD   2 Units at 04/14/15 1233  . insulin aspart (novoLOG) injection 0-5 Units  0-5 Units Subcutaneous QHS Sital Mody, MD      . ipratropium-albuterol (DUONEB) 0.5-2.5 (3) MG/3ML nebulizer solution 3 mL  3 mL Nebulization Q4H Vaughan Basta, MD   3 mL at 04/14/15 1624  . ipratropium-albuterol (DUONEB) 0.5-2.5 (3) MG/3ML nebulizer solution 3 mL  3 mL Nebulization Q2H PRN Bettey Costa, MD   3 mL at 04/14/15 1424  . levofloxacin (LEVAQUIN) tablet  750 mg  750 mg Oral Daily Bettey Costa, MD   750 mg at 04/14/15 0923  . LORazepam (ATIVAN) tablet 1 mg  1 mg Oral BID PRN Bettey Costa, MD      . metFORMIN (GLUCOPHAGE) tablet 500 mg  500 mg Oral BID WC Vaughan Basta, MD   500 mg at 04/14/15 0923  . methylPREDNISolone sodium succinate (SOLU-MEDROL) 40 mg/mL injection 40 mg  40 mg Intravenous Q12H Sital Mody, MD      . metoprolol (LOPRESSOR) tablet 50 mg  50 mg Oral BID Vaughan Basta, MD   50 mg at 04/14/15 0923  . morphine 2 MG/ML injection 0.5 mg  0.5 mg Intravenous  Q6H PRN Bettey Costa, MD   0.5 mg at 04/14/15 1000  . nitroGLYCERIN (NITROSTAT) SL tablet 0.4 mg  0.4 mg Sublingual Q5 min PRN Vaughan Basta, MD      . nystatin (MYCOSTATIN) 100000 UNIT/ML suspension 500,000 Units  5 mL Oral QID Bettey Costa, MD   500,000 Units at 04/14/15 1234  . pantoprazole (PROTONIX) EC tablet 40 mg  40 mg Oral Daily Vaughan Basta, MD   40 mg at 04/14/15 0923  . simvastatin (ZOCOR) tablet 20 mg  20 mg Oral Daily Vaughan Basta, MD   20 mg at 04/14/15 0924  . sodium chloride (OCEAN) 0.65 % nasal spray 1 spray  1 spray Each Nare TID Vaughan Basta, MD   1 spray at 04/14/15 0921  . tiotropium (SPIRIVA) inhalation capsule 18 mcg  18 mcg Inhalation Daily Vaughan Basta, MD   18 mcg at 04/14/15 0921    Musculoskeletal: Strength & Muscle Tone: within normal limits Gait & Station: unsteady Patient leans: N/A  Psychiatric Specialty Exam: Review of Systems  Constitutional: Positive for malaise/fatigue.  HENT: Negative.   Eyes: Negative.   Respiratory: Positive for cough, shortness of breath and wheezing.   Cardiovascular: Negative.   Gastrointestinal: Negative.   Musculoskeletal: Negative.   Skin: Negative.   Neurological: Positive for weakness.  Psychiatric/Behavioral: Positive for depression. Negative for suicidal ideas, hallucinations, memory loss and substance abuse. The patient is nervous/anxious and has insomnia.     Blood pressure 124/60, pulse 85, temperature 97.6 F (36.4 C), temperature source Oral, resp. rate 20, height 5' 7"  (1.702 m), weight 68.04 kg (150 lb), SpO2 95 %.Body mass index is 23.49 kg/(m^2).  General Appearance: Casual  Eye Contact::  Good  Speech:  Slow  Volume:  Decreased  Mood:  Dysphoric  Affect:  Congruent  Thought Process:  Coherent  Orientation:  Full (Time, Place, and Person)  Thought Content:  Negative  Suicidal Thoughts:  No  Homicidal Thoughts:  No  Memory:  Immediate;   Good Recent;    Fair Remote;   Good  Judgement:  Good  Insight:  Good  Psychomotor Activity:  Decreased  Concentration:  Good  Recall:  [place] of Knowledge:Good  Language: Good  Akathisia:  No  Handed:  Right  AIMS (if indicated):     Assets:  Communication Skills Desire for Improvement Housing Resilience Social Support  ADL's:  Intact  Cognition: WNL  Sleep:      Treatment Plan Summary: Medication management and Plan 65 year old man who is going through a grieving process. Given the grieving and also his acute illness it is hard to say that this would meet the criteria for a major depression. There is no sign of suicidality no psychosis and in many ways he still seems to be functioning relatively well. One area  that he very clearly complains of his difficulty sleeping. I propose adding mirtazapine 15 mg at night to help with sleep and also perhaps help with his mood and anxiety. Patient is agreeable to the plan. Please call psychiatrist on-call over the weekend if needed or I will check up on Monday.  Disposition: Patient does not meet criteria for psychiatric inpatient admission. Supportive therapy provided about ongoing stressors.  Alethia Berthold, MD 04/14/2015 4:24 PM

## 2015-04-14 NOTE — Care Management Important Message (Signed)
Important Message  Patient Details  Name: Bradley [name] MRN: [id number] Date of Birth: Mar 15, 1950   Medicare Important Message Given:  Yes    Juliann Pulse A Georgie Eduardo 04/14/2015, 9:28 AM

## 2015-04-14 NOTE — Progress Notes (Signed)
Date: 04/14/2015,   MRN# [id number] Bradley [name] 1950-07-16 Code Status:     Code Status Orders        Start     Ordered   04/12/15 1023  Full code   Continuous     04/12/15 1023    Code Status History    Date Active Date Inactive Code Status Order ID Comments User Context   10/08/2014  3:48 PM 10/11/2014  2:26 PM Full Code [id number]  Idelle Crouch, MD Inpatient    Advance Directive Documentation        Most Recent Value   Type of Advance Directive  Living will   Pre-existing out of facility DNR order (yellow form or pink MOST form)     "MOST" Form in Place?       Hosp day:'@LENGTHOFSTAYDAYS'$ @ Referring MD: '@ATDPROV'$ @     PCP:      AdmissionWeight: 150 lb (68.04 kg)                 CurrentWeight: 150 lb (68.04 kg)  HPI: sob, wheezing, cough, some better today. No fever or chills. Lost three family members in 2 years. Lost his wife last September.  PMHX:   Past Medical History  Diagnosis Date  . COPD (chronic obstructive pulmonary disease) ([place])   . Diabetes mellitus without complication ([place])   . Coronary artery disease   . Hypertension    Surgical Hx:  Past Surgical History  Procedure Laterality Date  . Lung biopsy Right     2006   Family Hx:  Family History  Problem Relation Age of Onset  . Cancer Mother   . Heart attack Father   . Cancer Sister    Social Hx:   Social History  Substance Use Topics  . Smoking status: Current Every Day Smoker -- 0.75 packs/day  . Smokeless tobacco: Never Used  . Alcohol Use: No   Medication:    Home Medication:  No current outpatient prescriptions on file.  Current Medication: '@CURMEDTAB'$ @   Allergies:  Review of patient's allergies indicates no known allergies.  Review of Systems: Gen:  Denies  fever, sweats, chills HEENT: Denies blurred vision, double vision, ear pain, eye pain, hearing loss, nose bleeds, sore throat Cvc:  No dizziness, chest pain or heaviness Resp:  Wheezing, doe  Gi: Denies swallowing  difficulty, stomach pain, nausea or vomiting, diarrhea, constipation, bowel incontinence Gu:  Denies bladder incontinence, burning urine Ext:   No Joint pain, stiffness or swelling Skin: No skin rash, easy bruising or bleeding or hives Endoc:  No polyuria, polydipsia , polyphagia or weight change Psych: No depression, insomnia or hallucinations  Other:  All other systems negative  Physical Examination:   VS: BP 134/76 mmHg  Pulse 88  Temp(Src) 97.5 F (36.4 C) (Oral)  Resp 18  Ht '5\' 7"'$  (1.702 m)  Wt 150 lb (68.04 kg)  BMI 23.49 kg/m2  SpO2 93%  General Appearance: No distress  Neuro/psych: without focal findings, mental status, speech normal, alert and oriented, cranial nerves 2-12 intact, reflexes normal and symmetric, sensation grossly normal  HEENT: PERRLA, EOM intact, no ptosis, no other lesions noticed, Mallampati: Pulmonary:.wheezing, No rales  No Sputum Production:   Cardiovascular:  Normal S1,S2.  No m/r/g.      Abdomen:Benign, Soft, non-tender, No masses, hepatosplenomegaly, No lymphadenopathy Endoc: No evident thyromegaly, no signs of acromegaly or Cushing features Skin:   warm, no rashes, no ecchymosis  Extremities: normal, no cyanosis, clubbing, no edema, warm with  normal capillary refill. Other findings:   Labs results:   Recent Labs     04/12/15  0744  04/12/15  1032  04/13/15  0556  04/14/15  0752  HGB  13.3  12.6*  12.2*  12.7*  HCT  40.5  38.3*  36.7*  38.7*  MCV  79.5*  78.8*  78.6*  79.6*  WBC  15.0*  13.1*  16.2*  18.2*  BUN  18   --   19  20  CREATININE  0.76  0.68  0.61  0.56*  GLUCOSE  142*   --   191*  143*  CALCIUM  8.6*   --   8.8*  9.1  ,    No results for input(s): PH in the last 72 hours.  Invalid input(s): PCO2, PO2, BASEEXCESS, BASEDEFICITE, TFT  Culture results:     Rad results:   Dg Chest 1 View  04/14/2015  CLINICAL DATA:  Shortness of breath, prior RIGHT lobectomy for tumor in 2006, COPD, hypertension, diabetes mellitus,  coronary artery disease EXAM: CHEST 1 VIEW COMPARISON:  Portable exam 0746 hours compared to 04/12/2015 FINDINGS: Normal heart size, mediastinal contours, and pulmonary vascularity. Emphysematous and bronchitic changes consistent with COPD. Nodular density questioned at LEFT base on previous exam no longer identified likely represented a superimposed artifact. Scattered RIGHT lung scarring. Chronic interstitial prominence with questionable mild atelectasis or developing infiltrate at LEFT lower lobe. Remaining lungs clear. Question small subpulmonic RIGHT pleural effusion. No pneumothorax or acute osseous findings. Stent identified in LEFT cervical region. IMPRESSION: Resolution of previously identified nodular density suspect external artifact which has been removed. Severe COPD changes and scarring with probable small subpulmonic RIGHT pleural effusion and mild atelectasis versus developing infiltrate at LEFT lower lobe. Electronically Signed   By: Lavonia Dana M.D.   On: 04/14/2015 08:08      Assessment and Plan: COPD ex smoker, with acute on chronic respiratory failure. Last week he had a chest xray that looked like a pneumonia with chronic RUL scarring. Was treated as such with clearing of the chest xray. ? Nipple shadow on the left side. Today feel some better, still weak and sob. He is depressed.  Resume home copd regimen Solumedrol Prn 02 Speech eval for aspiration dvy prophylaxis Repeat xray with nipple markers Suspect he will need portable oxygen Complete 10 day course of levaquin Psych eval    I have personally obtained a history, examined the patient, evaluated laboratory and imaging results, formulated the assessment and plan and placed orders.  The Patient requires high complexity decision making for assessment and support, frequent evaluation and titration of therapies, application of advanced monitoring technologies and extensive interpretation of multiple databases.   Herbon  Fleming,M.D. Pulmonary & Critical care Medicine [facility]

## 2015-04-15 DIAGNOSIS — E871 Hypo-osmolality and hyponatremia: Secondary | ICD-10-CM | POA: Diagnosis not present

## 2015-04-15 DIAGNOSIS — I10 Essential (primary) hypertension: Secondary | ICD-10-CM | POA: Diagnosis not present

## 2015-04-15 DIAGNOSIS — J441 Chronic obstructive pulmonary disease with (acute) exacerbation: Secondary | ICD-10-CM | POA: Diagnosis not present

## 2015-04-15 DIAGNOSIS — J9601 Acute respiratory failure with hypoxia: Secondary | ICD-10-CM | POA: Diagnosis not present

## 2015-04-15 LAB — BASIC METABOLIC PANEL
ANION GAP: 10 (ref 5–15)
BUN: 17 mg/dL (ref 6–20)
CALCIUM: 9 mg/dL (ref 8.9–10.3)
CO2: 27 mmol/L (ref 22–32)
CREATININE: 0.76 mg/dL (ref 0.61–1.24)
Chloride: 99 mmol/L — ABNORMAL LOW (ref 101–111)
GFR calc Af Amer: >60 mL/min (ref >60)
GLUCOSE: 106 mg/dL — AB (ref 65–99)
Potassium: 4.7 mmol/L (ref 3.5–5.1)
Sodium: 136 mmol/L (ref 135–145)

## 2015-04-15 LAB — GLUCOSE, CAPILLARY
GLUCOSE-CAPILLARY: 80 mg/dL (ref 65–99)
Glucose-Capillary: 126 mg/dL — ABNORMAL HIGH (ref 65–99)
Glucose-Capillary: 208 mg/dL — ABNORMAL HIGH (ref 65–99)
Glucose-Capillary: 107 mg/dL — ABNORMAL HIGH (ref 65–99)
Glucose-Capillary: 147 mg/dL — ABNORMAL HIGH (ref 65–99)

## 2015-04-15 MED ORDER — GUAIFENESIN ER 600 MG PO TB12
600.0000 mg | ORAL_TABLET | Freq: Two times a day (BID) | ORAL | Status: DC
  Administered 2015-04-15 – 2015-04-16 (×3): 600 mg via ORAL
  Filled 2015-04-15 (×3): qty 1

## 2015-04-15 NOTE — Progress Notes (Signed)
[facility] [place] NAME: Bradley [name]   MR#:  [id number]  DATE OF BIRTH:  04-16-1950  SUBJECTIVE:   Patient reports that his breathing is slowly improving, still short of breath, has productive cough   REVIEW OF SYSTEMS:    Review of Systems  Constitutional: Negative for fever, chills and malaise/fatigue.  HENT: Negative for sore throat.   Eyes: Negative for blurred vision.  Respiratory: Positive for shortness of breath and wheezing. Positive cough no hemoptysis.   Cardiovascular: Negative for chest pain, palpitations and leg swelling.  Gastrointestinal: Negative for nausea, vomiting, abdominal pain, diarrhea and blood in stool.  Genitourinary: Negative for dysuria.  Musculoskeletal: Negative for back pain.  Neurological: Negative for dizziness, tremors and headaches.  Endo/Heme/Allergies: Does not bruise/bleed easily.    Tolerating Diet:yes      DRUG ALLERGIES:  No Known Allergies  VITALS:  Blood pressure 137/73, pulse 89, temperature 97 F (36.1 C), temperature source Axillary, resp. rate 20, height '5\' 7"'$  (1.702 m), weight 68.04 kg (150 lb), SpO2 95 %.  PHYSICAL EXAMINATION:   Physical Exam  Constitutional: He is oriented to person, place, and time and well-developed, well-nourished, and in no distress. No distress.  HENT:  Head: Normocephalic.  Eyes: No scleral icterus.  Neck: Normal range of motion. Neck supple. No JVD present. No tracheal deviation present.  Cardiovascular: Normal rate, regular rhythm and normal heart sounds.  Exam reveals no gallop and no friction rub.   No murmur heard. Pulmonary/Chest: Effort normal. No respiratory distress. He has wheezes. He has no rales. He exhibits no tenderness.  Abdominal: Soft. Bowel sounds are normal. He exhibits no distension and no mass. There is no tenderness. There is no rebound and no guarding.  Musculoskeletal: Normal range of motion. He exhibits no edema.   Neurological: He is alert and oriented to person, place, and time.  Skin: Skin is warm. No rash noted. No erythema.  Psychiatric: Affect and judgment normal.      LABORATORY PANEL:   CBC  Recent Labs Lab 04/14/15 0752  WBC 18.2*  HGB 12.7*  HCT 38.7*  PLT 375   ------------------------------------------------------------------------------------------------------------------  Chemistries   Recent Labs Lab 04/15/15 0604  NA 136  K 4.7  CL 99*  CO2 27  GLUCOSE 106*  BUN 17  CREATININE 0.76  CALCIUM 9.0   ------------------------------------------------------------------------------------------------------------------  Cardiac Enzymes  Recent Labs Lab 04/12/15 0744  TROPONINI <0.03   ------------------------------------------------------------------------------------------------------------------  RADIOLOGY:  Dg Chest 1 View  04/14/2015  CLINICAL DATA:  Shortness of breath, prior RIGHT lobectomy for tumor in 2006, COPD, hypertension, diabetes mellitus, coronary artery disease EXAM: CHEST 1 VIEW COMPARISON:  Portable exam 0746 hours compared to 04/12/2015 FINDINGS: Normal heart size, mediastinal contours, and pulmonary vascularity. Emphysematous and bronchitic changes consistent with COPD. Nodular density questioned at LEFT base on previous exam no longer identified likely represented a superimposed artifact. Scattered RIGHT lung scarring. Chronic interstitial prominence with questionable mild atelectasis or developing infiltrate at LEFT lower lobe. Remaining lungs clear. Question small subpulmonic RIGHT pleural effusion. No pneumothorax or acute osseous findings. Stent identified in LEFT cervical region. IMPRESSION: Resolution of previously identified nodular density suspect external artifact which has been removed. Severe COPD changes and scarring with probable small subpulmonic RIGHT pleural effusion and mild atelectasis versus developing infiltrate at LEFT lower lobe.  Electronically Signed   By: Lavonia Dana M.D.   On: 04/14/2015 08:08     ASSESSMENT AND PLAN:  65 year old male with a history of CAD, essential hypertension and COPD who presents with acute hypoxic respiratory failure.  1.. Acute hypoxic respiratory failure: Due to COPD exacerbation. Continue budesonide therapy, nebulizer therapy and Solu-Medrol and Spiriva Add Mucinex to his therapy   2. COPD exacerbation: Continue to wean IV steroids as tolerated As outlined above ENT requested due to patient feeling like food is getting stuck in his throat  3. Hyponatremia: Sodium level normalized with IV fluid  4. Essential hypertension: Blood pressure is acceptable. Continue enalapril.  5. History of CAD: Continue aspirin, Plavix, Zocor enalapril and metoprolol.  6. Diabetes: Continue metformin, sliding scale insulin and ADA diet.  7. Tobacco dependence: Patient was counseled on admission by admitting physician.  Management plans discussed with the patient and he is in agreement. Plan of care discussed with daughter CODE STATUS: FULL  TOTAL TIME TAKING CARE OF THIS PATIENT: 25 minutes.     POSSIBLE D/C Sunday , DEPENDING ON CLINICAL CONDITION.   Dustin Flock M.D on 04/15/2015 at 11:08 AM  Between 7am to 6pm - Pager - [phone] After 6pm go to [email or web address] - password [email or web address] [facility]  Office  [phone]  CC: Primary care physician; Valera Castle, MD  Note: This dictation was prepared with Dragon dictation along with smaller phrase technology. Any transcriptional errors that result from this process are unintentional.

## 2015-04-15 NOTE — Progress Notes (Signed)
Dr. Ulysees Barns notified that patient must see outpatient ENT. Consult discontinued. Patient and family made aware. Madlyn Frankel, RN

## 2015-04-16 DIAGNOSIS — J441 Chronic obstructive pulmonary disease with (acute) exacerbation: Secondary | ICD-10-CM | POA: Diagnosis not present

## 2015-04-16 DIAGNOSIS — I10 Essential (primary) hypertension: Secondary | ICD-10-CM | POA: Diagnosis not present

## 2015-04-16 DIAGNOSIS — E871 Hypo-osmolality and hyponatremia: Secondary | ICD-10-CM | POA: Diagnosis not present

## 2015-04-16 DIAGNOSIS — J9601 Acute respiratory failure with hypoxia: Secondary | ICD-10-CM | POA: Diagnosis not present

## 2015-04-16 LAB — GLUCOSE, CAPILLARY
GLUCOSE-CAPILLARY: 160 mg/dL — AB (ref 65–99)
Glucose-Capillary: 137 mg/dL — ABNORMAL HIGH (ref 65–99)
Glucose-Capillary: 140 mg/dL — ABNORMAL HIGH (ref 65–99)

## 2015-04-16 MED ORDER — PREDNISONE 10 MG (21) PO TBPK: ORAL_TABLET | ORAL | Status: DC

## 2015-04-16 MED ORDER — GUAIFENESIN ER 600 MG PO TB12: 600.0000 mg | ORAL_TABLET | Freq: Two times a day (BID) | ORAL | Status: DC

## 2015-04-16 MED ORDER — NYSTATIN 100000 UNIT/ML MT SUSP: 5.0000 mL | Freq: Four times a day (QID) | OROMUCOSAL | Status: DC

## 2015-04-16 MED ORDER — LEVOFLOXACIN 750 MG PO TABS: 750.0000 mg | ORAL_TABLET | Freq: Every day | ORAL | Status: DC

## 2015-04-16 MED ORDER — MIRTAZAPINE 15 MG PO TABS: 15.0000 mg | ORAL_TABLET | Freq: Every day | ORAL | Status: DC

## 2015-04-16 NOTE — Care Management Note (Signed)
Case Management Note  Patient Details  Name: Bradley [name] MRN: [id number] Date of Birth: 1950/08/31  Subjective/Objective:     Updated O2 Sats faxed to [facility] at [facility].                Action/Plan:   Expected Discharge Date:  04/14/15               Expected Discharge Plan:     In-House Referral:     Discharge planning Services     Post Acute Care Choice:    Choice offered to:     DME Arranged:    DME Agency:     HH Arranged:    [place] Agency:     Status of Service:     Medicare Important Message Given:  Yes Date Medicare IM Given:    Medicare IM give by:    Date Additional Medicare IM Given:    Additional Medicare Important Message give by:     If discussed at [place] of Stay Meetings, dates discussed:    Additional Comments:  Bradley Ellerman A, RN 04/16/2015, 3:17 PM

## 2015-04-16 NOTE — Progress Notes (Signed)
MD order received in [facility] to discharge pt home with home oxygen today; Care Management previously established home oxygen via [place]; verbally reviewed AVS with pt including follow up appointments/pt to call on 04/17/15 in am to make appointments; medications/Rxs escribed to [facility] pharmacy/pt to pick up; diet/cardiac; activity level/as tolerated; no questions voiced at this time; pt's discharge pending delivery of portable oxygen by [facility] to pt's room

## 2015-04-16 NOTE — Discharge Summary (Signed)
Bradley [name], 65 y.o., DOB Feb 14, 1951, MRN [id number]. Admission date: 04/12/2015 Discharge Date 04/16/2015 Primary MD Valera Castle, MD Admitting Physician Vaughan Basta, MD  Admission Diagnosis  Respiratory distress [R06.00]  Discharge Diagnosis   Principal Problem: Acute hypoxic respiratory failure due to acute on chronic COPD exasperation Acute on chronic COPD exasperation Chronic respiratory failure will need oxygen at home Hyponatremia Essential hypertension Coronary artery disease Diabetes type 2 Tobacco dependence Adjustment disorder with mixed anxiety and depressed mood      [place] is a 65 y.o. male with a known history of , coronary artery disease, hypertension who presented to the emergency room complaining of shortness of breath and cough. He was noted to have up acute hypoxic respiratory failure due to acute on chronic COPD exasperation. His chest x-ray showed no evidence of pneumonia. Patient was treated with antibiotics nebulizers and steroids. He was seen by his pulmonologist who recommended same therapy. Patient's breathing is much improved. He was ambulated in need of home oxygen therapy. Patient also had depression symptoms and insomnia. Patient had recently 3 deaths in his family. Psychiatry felt that he was going through  normal grief process. Patient was started on Remeron. With improvement in his symptoms. He was ambulated in the hall with oxygen saturations dropping. He'll need home oxygen. Patient will need outpatient ENT follow-up. Due to some complaint of swallowing difficulties and feeling like something being stuck in his throat.          Consults  pulmonary/intensive care, pscychiatry  Significant Tests:  See full reports for all details      Dg Chest 1 View  04/14/2015  CLINICAL DATA:  Shortness of breath, prior RIGHT lobectomy for tumor in 2006, COPD, hypertension, diabetes mellitus, coronary artery disease EXAM:  CHEST 1 VIEW COMPARISON:  Portable exam 0746 hours compared to 04/12/2015 FINDINGS: Normal heart size, mediastinal contours, and pulmonary vascularity. Emphysematous and bronchitic changes consistent with COPD. Nodular density questioned at LEFT base on previous exam no longer identified likely represented a superimposed artifact. Scattered RIGHT lung scarring. Chronic interstitial prominence with questionable mild atelectasis or developing infiltrate at LEFT lower lobe. Remaining lungs clear. Question small subpulmonic RIGHT pleural effusion. No pneumothorax or acute osseous findings. Stent identified in LEFT cervical region. IMPRESSION: Resolution of previously identified nodular density suspect external artifact which has been removed. Severe COPD changes and scarring with probable small subpulmonic RIGHT pleural effusion and mild atelectasis versus developing infiltrate at LEFT lower lobe. Electronically Signed   By: Lavonia Dana M.D.   On: 04/14/2015 08:08   Dg Chest 2 View  04/12/2015  CLINICAL DATA:  Bronchitis, sick for 8 days, on prednisone and antibiotics, shortness of breath, history hypertension, diabetes mellitus, COPD/emphysema, coronary artery disease, former smoker EXAM: CHEST  2 VIEW COMPARISON:  07/14/2014; correlation CT chest 10/08/2014 FINDINGS: Normal heart size, mediastinal contours, and pulmonary vascularity. Atherosclerotic calcification aorta. Emphysematous and bronchitic changes compatible with severe COPD. Extensive scarring greatest in RIGHT upper lobe. New nodular density LEFT lateral lung base versus superimposed artifact. From its position, I doubt this represents a nipple shadow. On lateral view, several EKG leads identified at the lower anterior chest, uncertain if the new nodular density could be related to an EKG lead but pulmonary nodule not excluded. Blunting of RIGHT costophrenic angle appears stable. Atelectasis RIGHT mid lung posteriorly appears unchanged with surgical clips  at region likely a combination of postoperative and post inflammatory changes. No definite acute infiltrate, pleural effusion or  pneumothorax. IMPRESSION: Severe COPD with scarring in RIGHT upper lobe and posterior mid RIGHT lung. New nodular density at LEFT lung base is of uncertain etiology, not felt to represent a nipple shadow but potentially could be related to a superimposed EKG lead; repeat PA chest radiograph with nipple markers and removal of all EKG leads recommended to exclude pulmonary nodule. Electronically Signed   By: Lavonia Dana M.D.   On: 04/12/2015 08:12       Today   Subjective:   Bradley [name]  Feels better sob improved  Objective:   Blood pressure 120/77, pulse 101, temperature 97.6 F (36.4 C), temperature source Oral, resp. rate 18, height '5\' 7"'$  (1.702 m), weight 68.04 kg (150 lb), SpO2 88 %.  .  Intake/Output Summary (Last 24 hours) at 04/16/15 1239 Last data filed at 04/16/15 1156  Gross per 24 hour  Intake 2771.25 ml  Output   4500 ml  Net -1728.75 ml    Exam VITAL SIGNS: Blood pressure 120/77, pulse 101, temperature 97.6 F (36.4 C), temperature source Oral, resp. rate 18, height '5\' 7"'$  (1.702 m), weight 68.04 kg (150 lb), SpO2 88 %.  GENERAL:  65 y.o.-year-old patient lying in the bed with no acute distress.  EYES: Pupils equal, round, reactive to light and accommodation. No scleral icterus. Extraocular muscles intact.  HEENT: Head atraumatic, normocephalic. Oropharynx and nasopharynx clear.  NECK:  Supple, no jugular venous distention. No thyroid enlargement, no tenderness.  LUNGS: Normal breath sounds bilaterally, no wheezing, rales,rhonchi or crepitation. No use of accessory muscles of respiration.  CARDIOVASCULAR: S1, S2 normal. No murmurs, rubs, or gallops.  ABDOMEN: Soft, nontender, nondistended. Bowel sounds present. No organomegaly or mass.  EXTREMITIES: No pedal edema, cyanosis, or clubbing.  NEUROLOGIC: Cranial nerves II through XII are intact.  Muscle strength 5/5 in all extremities. Sensation intact. Gait not checked.  PSYCHIATRIC: The patient is alert and oriented x 3.  SKIN: No obvious rash, lesion, or ulcer.   Data Review     CBC w Diff: Lab Results  Component Value Date   WBC 18.2* 04/14/2015   WBC 9.7 12/07/2013   HGB 12.7* 04/14/2015   HGB 14.2 12/07/2013   HCT 38.7* 04/14/2015   HCT 42.1 12/07/2013   PLT 375 04/14/2015   PLT 340 12/07/2013   LYMPHOPCT 25 04/12/2015   LYMPHOPCT 29.2 12/07/2013   MONOPCT 9 04/12/2015   MONOPCT 8.9 12/07/2013   EOSPCT 5 04/12/2015   EOSPCT 2.4 12/07/2013   BASOPCT 1 04/12/2015   BASOPCT 1.1 12/07/2013   CMP: Lab Results  Component Value Date   NA 136 04/15/2015   NA 131* 04/14/2013   K 4.7 04/15/2015   K 4.0 04/14/2013   CL 99* 04/15/2015   CL 99 04/14/2013   CO2 27 04/15/2015   CO2 28 04/14/2013   BUN 17 04/15/2015   BUN 9 04/14/2013   CREATININE 0.76 04/15/2015   CREATININE 0.62 04/14/2013   PROT 6.2* 10/09/2014   ALBUMIN 2.4* 10/09/2014   BILITOT <0.1* 10/09/2014   ALKPHOS 84 10/09/2014   AST 11* 10/09/2014   ALT 19 10/09/2014  .  Micro Results Recent Results (from the past 240 hour(s))  Culture, blood (routine x 2)     Status: None (Preliminary result)   Collection Time: 04/12/15  8:17 AM  Result Value Ref Range Status   Specimen Description BLOOD LEFT AC  Final   Special Requests   Final    BOTTLES DRAWN AEROBIC AND ANAEROBIC AER 11ML ANA  8ML   Culture NO GROWTH 3 DAYS  Final   Report Status PENDING  Incomplete  Culture, blood (routine x 2)     Status: None (Preliminary result)   Collection Time: 04/12/15  8:18 AM  Result Value Ref Range Status   Specimen Description BLOOD RIGHT ARM  Final   Special Requests   Final    BOTTLES DRAWN AEROBIC AND ANAEROBIC AER 13ML ANA 16ML   Culture NO GROWTH 3 DAYS  Final   Report Status PENDING  Incomplete        Code Status Orders        Start     Ordered   04/12/15 1023  Full code   Continuous      04/12/15 1023    Code Status History    Date Active Date Inactive Code Status Order ID Comments User Context   10/08/2014  3:48 PM 10/11/2014  2:26 PM Full Code [id number]  Idelle Crouch, MD Inpatient    Advance Directive Documentation        Most Recent Value   Type of Advance Directive  Living will   Pre-existing out of facility DNR order (yellow form or pink MOST form)     "MOST" Form in Place?            Follow-up Information    Follow up with Riley Nearing, MD In 1 week.   Specialty:  Otolaryngology   Why:  hoarseness   Contact information:   [address] [place] [address] [phone]       Discharge Medications     Medication List    STOP taking these medications        predniSONE 5 MG tablet  Commonly known as:  DELTASONE  Replaced by:  predniSONE 10 MG (21) Tbpk tablet      TAKE these medications        albuterol (2.5 MG/3ML) 0.083% nebulizer solution  Commonly known as:  PROVENTIL  Take 2.5 mg by nebulization every 6 (six) hours as needed for wheezing or shortness of breath.     aspirin 81 MG tablet  Take 81 mg by mouth daily.     clopidogrel 75 MG tablet  Commonly known as:  PLAVIX  Take 75 mg by mouth daily.     COMBIVENT RESPIMAT IN  Inhale into the lungs.     enalapril 10 MG tablet  Commonly known as:  VASOTEC  Take 10 mg by mouth daily.     Fluticasone-Salmeterol 250-50 MCG/DOSE Aepb  Commonly known as:  ADVAIR  Inhale 1 puff into the lungs 2 (two) times daily.     furosemide 40 MG tablet  Commonly known as:  LASIX  Take 40 mg by mouth daily as needed.     guaiFENesin 600 MG 12 hr tablet  Commonly known as:  MUCINEX  Take 1 tablet (600 mg total) by mouth 2 (two) times daily.     levofloxacin 750 MG tablet  Commonly known as:  LEVAQUIN  Take 1 tablet (750 mg total) by mouth daily.     metFORMIN 500 MG tablet  Commonly known as:  GLUCOPHAGE  Take by mouth 2 (two) times daily with a meal.      metoprolol 50 MG tablet  Commonly known as:  LOPRESSOR  Take 50 mg by mouth 2 (two) times daily.     mirtazapine 15 MG tablet  Commonly known as:  REMERON  Take 1 tablet (15 mg  total) by mouth at bedtime.     nitroGLYCERIN 0.4 MG SL tablet  Commonly known as:  NITROSTAT  Place 0.4 mg under the tongue every 5 (five) minutes as needed for chest pain.     nystatin 100000 UNIT/ML suspension  Commonly known as:  MYCOSTATIN  Take 5 mLs (500,000 Units total) by mouth 4 (four) times daily.     pantoprazole 40 MG tablet  Commonly known as:  PROTONIX  Take 40 mg by mouth daily.     predniSONE 10 MG (21) Tbpk tablet  Commonly known as:  STERAPRED UNI-PAK 21 TAB  Start at '50mg'$  taper by 10 until complete     simvastatin 20 MG tablet  Commonly known as:  ZOCOR  Take 20 mg by mouth daily.     tiotropium 18 MCG inhalation capsule  Commonly known as:  SPIRIVA  Place 18 mcg into inhaler and inhale daily.           Total Time in preparing paper work, data evaluation and todays exam - 35 minutes  Dustin Flock M.D on 04/16/2015 at 12:39 PM  [facility]   Office  [phone]

## 2015-04-16 NOTE — Discharge Instructions (Signed)
°  DIET:  °Cardiac diet ° °DISCHARGE CONDITION:  °Stable ° °ACTIVITY:  °Activity as tolerated ° °OXYGEN:  °Home Oxygen: Yes.   °  °Oxygen Delivery: 2 liters/min via Patient connected to nasal cannula oxygen ° °DISCHARGE LOCATION:  °home  ° ° °ADDITIONAL DISCHARGE INSTRUCTION: ° ° °If you experience worsening of your admission symptoms, develop shortness of breath, life threatening emergency, suicidal or homicidal thoughts you must seek medical attention immediately by calling 911 or calling your MD immediately  if symptoms less severe. ° °You Must read complete instructions/literature along with all the possible adverse reactions/side effects for all the Medicines you take and that have been prescribed to you. Take any new Medicines after you have completely understood and accpet all the possible adverse reactions/side effects.  ° °Please note ° °You were cared for by a hospitalist during your hospital stay. If you have any questions about your discharge medications or the care you received while you were in the hospital after you are discharged, you can call the unit and asked to speak with the hospitalist on call if the hospitalist that took care of you is not available. Once you are discharged, your primary care physician will handle any further medical issues. Please note that NO REFILLS for any discharge medications will be authorized once you are discharged, as it is imperative that you return to your primary care physician (or establish a relationship with a primary care physician if you do not have one) for your aftercare needs so that they can reassess your need for medications and monitor your lab values. ° ° °

## 2015-04-16 NOTE — Care Management Note (Signed)
Case Management Note  Patient Details  Name: Bradley [name] MRN: [id number] Date of Birth: Aug 29, 1950  Subjective/Objective:        Received [name] call from [place] requesting an O2 Sat measurement of Bradley [name] ambulating with 2L N/C to show improvement on oxygen. This request was called to Bradley [name], Therapist, sports. Case manager will fax this measurement to [facility] as soon as [name] nursing note is entered in [place].           Action/Plan:   Expected Discharge Date:  04/14/15               Expected Discharge Plan:     In-House Referral:     Discharge planning Services     Post Acute Care Choice:    Choice offered to:     DME Arranged:    DME Agency:     HH Arranged:    [place] Agency:     Status of Service:     Medicare Important Message Given:  Yes Date Medicare IM Given:    Medicare IM give by:    Date Additional Medicare IM Given:    Additional Medicare Important Message give by:     If discussed at [place] of Stay Meetings, dates discussed:    Additional Comments:  Bradley Polanco A, RN 04/16/2015, 2:45 PM

## 2015-04-16 NOTE — Care Management Note (Signed)
Case Management Note  Patient Details  Name: Bradley [name] MRN: [id number] Date of Birth: 1951/01/28  Subjective/Objective:    A referral was faxed and called to [facility] requesting delivery of a portable oxygen tank to Bradley [name] in [facility] room 113 today, and a new oxygen set up at his home. Bradley [name] will be discharged home after delivery of his portable oxygen and new home oxygen set-up.                 Action/Plan:   Expected Discharge Date:  04/14/15               Expected Discharge Plan:     In-House Referral:     Discharge planning Services     Post Acute Care Choice:    Choice offered to:     DME Arranged:    DME Agency:     HH Arranged:    [place] Agency:     Status of Service:     Medicare Important Message Given:  Yes Date Medicare IM Given:    Medicare IM give by:    Date Additional Medicare IM Given:    Additional Medicare Important Message give by:     If discussed at [place] of Stay Meetings, dates discussed:    Additional Comments:  Bradley [name] A, RN 04/16/2015, 2:05 PM

## 2015-04-16 NOTE — Progress Notes (Signed)
[facility] delivered pt's home oxygen to the room; pt discharged via wheelchair by nursing to the visitor's entrance

## 2015-04-16 NOTE — Progress Notes (Addendum)
Patient 95% on room air at rest. Patient ambulated one lap around nurses station - O2 dropped to 88% on room air with exertion. MD aware. Madlyn Frankel, RN  Patient placed on 2L-O2 and ambulated - sat remained at 93%. CM aware of need for home oxygen. 2:34 PM

## 2015-04-17 DIAGNOSIS — R0600 Dyspnea, unspecified: Secondary | ICD-10-CM | POA: Diagnosis not present

## 2015-04-17 DIAGNOSIS — J449 Chronic obstructive pulmonary disease, unspecified: Secondary | ICD-10-CM | POA: Diagnosis not present

## 2015-04-17 LAB — CULTURE, BLOOD (ROUTINE X 2)
CULTURE: NO GROWTH
CULTURE: NO GROWTH

## 2015-04-20 ENCOUNTER — Other Ambulatory Visit: Payer: Self-pay | Admitting: Otolaryngology

## 2015-04-20 DIAGNOSIS — R1314 Dysphagia, pharyngoesophageal phase: Secondary | ICD-10-CM | POA: Diagnosis not present

## 2015-04-20 DIAGNOSIS — J441 Chronic obstructive pulmonary disease with (acute) exacerbation: Secondary | ICD-10-CM | POA: Diagnosis not present

## 2015-04-20 DIAGNOSIS — J180 Bronchopneumonia, unspecified organism: Secondary | ICD-10-CM | POA: Diagnosis not present

## 2015-04-20 DIAGNOSIS — F418 Other specified anxiety disorders: Secondary | ICD-10-CM | POA: Diagnosis not present

## 2015-04-20 DIAGNOSIS — J449 Chronic obstructive pulmonary disease, unspecified: Secondary | ICD-10-CM | POA: Diagnosis not present

## 2015-04-20 DIAGNOSIS — R1310 Dysphagia, unspecified: Secondary | ICD-10-CM

## 2015-04-20 DIAGNOSIS — R0600 Dyspnea, unspecified: Secondary | ICD-10-CM | POA: Diagnosis not present

## 2015-04-20 DIAGNOSIS — Z720 Tobacco use: Secondary | ICD-10-CM | POA: Diagnosis not present

## 2015-04-24 DIAGNOSIS — J439 Emphysema, unspecified: Secondary | ICD-10-CM | POA: Diagnosis not present

## 2015-04-24 DIAGNOSIS — J310 Chronic rhinitis: Secondary | ICD-10-CM | POA: Diagnosis not present

## 2015-04-24 DIAGNOSIS — R05 Cough: Secondary | ICD-10-CM | POA: Diagnosis not present

## 2015-04-24 DIAGNOSIS — R0902 Hypoxemia: Secondary | ICD-10-CM | POA: Diagnosis not present

## 2015-04-26 ENCOUNTER — Ambulatory Visit
Admission: RE | Admit: 2015-04-26 | Discharge: 2015-04-26 | Disposition: A | Discharge status: Home / Self Care | Payer: PPO | Source: Ambulatory Visit | Attending: Otolaryngology | Admitting: Otolaryngology

## 2015-04-26 DIAGNOSIS — K229 Disease of esophagus, unspecified: Secondary | ICD-10-CM | POA: Diagnosis not present

## 2015-04-26 DIAGNOSIS — K449 Diaphragmatic hernia without obstruction or gangrene: Secondary | ICD-10-CM | POA: Diagnosis not present

## 2015-04-26 DIAGNOSIS — R1310 Dysphagia, unspecified: Secondary | ICD-10-CM | POA: Diagnosis not present

## 2015-05-15 DIAGNOSIS — R0600 Dyspnea, unspecified: Secondary | ICD-10-CM | POA: Diagnosis not present

## 2015-05-15 DIAGNOSIS — J449 Chronic obstructive pulmonary disease, unspecified: Secondary | ICD-10-CM | POA: Diagnosis not present

## 2015-05-18 DIAGNOSIS — Z720 Tobacco use: Secondary | ICD-10-CM | POA: Diagnosis not present

## 2015-05-18 DIAGNOSIS — Z136 Encounter for screening for cardiovascular disorders: Secondary | ICD-10-CM | POA: Diagnosis not present

## 2015-05-18 DIAGNOSIS — J441 Chronic obstructive pulmonary disease with (acute) exacerbation: Secondary | ICD-10-CM | POA: Diagnosis not present

## 2015-05-19 ENCOUNTER — Encounter: Payer: Self-pay | Admitting: *Deleted

## 2015-05-19 ENCOUNTER — Other Ambulatory Visit: Payer: Self-pay | Admitting: *Deleted

## 2015-05-19 NOTE — Patient Outreach (Signed)
[facility]) Care Management  05/19/2015  Bradley [name] 1950-05-31 [id number]   Subjective: Telephone call to patient's home number, spoke with patient, and HIPAA verified.   Patient gave verbal authorization for RNCM to speak with daughter Bradley [name]) regarding his healthcare needs as needed.   Discussed [facility] Care Management services and patient in agreement to receive services.   Patient states he is doing well and doing better with working through the loss of his family members.  Patient states he saw his primary MD, Dr. Johny Drilling on 05/18/15.  Patient states he is able to self manage his COPD, diabetes, and hypertension.  States his A1C four months ago was 6.25 and he is due for next AC1 in 2 months.   Patient states he does not have any disease education, care coordination, and disease management needs at this time.    Patient in agreement to referral to [place] Management Social Worker for grief counseling support due to recent death of 3 family members.   States his grandson and a friend have moved into to his house.    Patient states he does not have any transportation needs, is able to afford medications, and has no questions for pharmacist.   Patient in agreement to continue to receive [place] Management services.    Patient in agreement to receive information on [facility] for later reference of additional services.   Objective: Per Epic case review:  Patient hospitalized  04/12/15 - 04/16/15 for COPD exacerbation.  Patient also has a history of tobacco dependence.    Assessment:  Received [facility] referral on 05/08/15.   Referral source:  Bradley [name].   Referral reason: LCSW counseling support due to number of loses with family members.  Disease and symptom management.   Patient in agreement with Bradley [name] Management Social Referral.   No Telephonic RNCM needs at this time.   Plan: RNCM will send update message to [place] Management Social Worker regarding  referral. RNCM will send patient successful outreach letter, [facility] pamphlet, and magnet.   Shatara Stanek H. Annia Friendly, BSN, [place] Management [facility] Telephonic CM Phone: [phone] Fax: [phone]

## 2015-05-19 NOTE — Patient Outreach (Signed)
[facility]) Care Management  05/19/2015  Bradley [name] 03-10-1950 [id number]  Referral received from [facility] to assist patient with counseling services.  Phone call to patient. HIPPA compliant voicemail message left requesting a return call.   Sheralyn Boatman [facility] Care Management [phone]

## 2015-05-23 DIAGNOSIS — J439 Emphysema, unspecified: Secondary | ICD-10-CM | POA: Diagnosis not present

## 2015-05-25 ENCOUNTER — Other Ambulatory Visit: Payer: Self-pay | Admitting: *Deleted

## 2015-05-25 ENCOUNTER — Encounter: Payer: Self-pay | Admitting: *Deleted

## 2015-05-25 NOTE — Patient Outreach (Signed)
[facility]) Care Management  05/25/2015  Bradley [name] May 11, 1950 [id number]  This patient was referred to this social worker for counseling support.  This social worker spoke to patient to assess for support needs. Patient explained several losses in his life.  However despite this, he has adjusted well.  Per patient, he has faith in [place], has developed a hobby and his grandson has moved in which has helped a lot.  Grief counseling offered, however per patient, he does not feel that he needs counseling at this time.  Patient denied having any thoughts of self harm or harm to others.  Per patient he has one gun in the home that he has had since he was 65 years old and has it put away.  Patient provided with this social worker's contact information if any issues arise in the future.   Plan:  This social worker will close patient's case to social work at this time    [place], [place] [facility] [phone]

## 2015-06-02 ENCOUNTER — Other Ambulatory Visit: Payer: Self-pay | Admitting: Family Medicine

## 2015-06-02 DIAGNOSIS — Z136 Encounter for screening for cardiovascular disorders: Secondary | ICD-10-CM

## 2015-06-06 ENCOUNTER — Ambulatory Visit: Payer: PPO

## 2015-06-15 DIAGNOSIS — R0600 Dyspnea, unspecified: Secondary | ICD-10-CM | POA: Diagnosis not present

## 2015-06-15 DIAGNOSIS — J449 Chronic obstructive pulmonary disease, unspecified: Secondary | ICD-10-CM | POA: Diagnosis not present

## 2015-07-15 DIAGNOSIS — J449 Chronic obstructive pulmonary disease, unspecified: Secondary | ICD-10-CM | POA: Diagnosis not present

## 2015-07-15 DIAGNOSIS — R0600 Dyspnea, unspecified: Secondary | ICD-10-CM | POA: Diagnosis not present

## 2015-07-19 DIAGNOSIS — E119 Type 2 diabetes mellitus without complications: Secondary | ICD-10-CM | POA: Diagnosis not present

## 2015-07-19 DIAGNOSIS — I10 Essential (primary) hypertension: Secondary | ICD-10-CM | POA: Diagnosis not present

## 2015-07-19 DIAGNOSIS — E782 Mixed hyperlipidemia: Secondary | ICD-10-CM | POA: Diagnosis not present

## 2015-07-19 DIAGNOSIS — J441 Chronic obstructive pulmonary disease with (acute) exacerbation: Secondary | ICD-10-CM | POA: Diagnosis not present

## 2015-07-19 DIAGNOSIS — Z136 Encounter for screening for cardiovascular disorders: Secondary | ICD-10-CM | POA: Diagnosis not present

## 2015-07-27 ENCOUNTER — Other Ambulatory Visit: Payer: Self-pay | Admitting: Family Medicine

## 2015-07-27 DIAGNOSIS — Z87891 Personal history of nicotine dependence: Secondary | ICD-10-CM

## 2015-07-27 DIAGNOSIS — Z136 Encounter for screening for cardiovascular disorders: Secondary | ICD-10-CM

## 2015-08-02 ENCOUNTER — Ambulatory Visit
Admission: RE | Admit: 2015-08-02 | Discharge: 2015-08-02 | Disposition: A | Discharge status: Home / Self Care | Payer: PPO | Source: Ambulatory Visit | Attending: Family Medicine | Admitting: Family Medicine

## 2015-08-02 DIAGNOSIS — I77811 Abdominal aortic ectasia: Secondary | ICD-10-CM | POA: Diagnosis not present

## 2015-08-02 DIAGNOSIS — Z136 Encounter for screening for cardiovascular disorders: Secondary | ICD-10-CM | POA: Diagnosis not present

## 2015-08-02 DIAGNOSIS — Z87891 Personal history of nicotine dependence: Secondary | ICD-10-CM

## 2015-08-15 DIAGNOSIS — J449 Chronic obstructive pulmonary disease, unspecified: Secondary | ICD-10-CM | POA: Diagnosis not present

## 2015-08-15 DIAGNOSIS — R0600 Dyspnea, unspecified: Secondary | ICD-10-CM | POA: Diagnosis not present

## 2015-08-23 DIAGNOSIS — R0602 Shortness of breath: Secondary | ICD-10-CM | POA: Diagnosis not present

## 2015-08-23 DIAGNOSIS — J439 Emphysema, unspecified: Secondary | ICD-10-CM | POA: Diagnosis not present

## 2015-09-14 DIAGNOSIS — J449 Chronic obstructive pulmonary disease, unspecified: Secondary | ICD-10-CM | POA: Diagnosis not present

## 2015-09-14 DIAGNOSIS — R0600 Dyspnea, unspecified: Secondary | ICD-10-CM | POA: Diagnosis not present

## 2015-09-19 ENCOUNTER — Emergency Department: Payer: PPO

## 2015-09-19 ENCOUNTER — Observation Stay
Admission: EM | Admit: 2015-09-19 | Discharge: 2015-09-20 | Disposition: A | Discharge status: Home / Self Care | Payer: PPO | Attending: Internal Medicine | Admitting: Internal Medicine

## 2015-09-19 ENCOUNTER — Encounter: Payer: Self-pay | Admitting: Intensive Care

## 2015-09-19 DIAGNOSIS — Z7902 Long term (current) use of antithrombotics/antiplatelets: Secondary | ICD-10-CM | POA: Diagnosis not present

## 2015-09-19 DIAGNOSIS — D72829 Elevated white blood cell count, unspecified: Secondary | ICD-10-CM | POA: Diagnosis not present

## 2015-09-19 DIAGNOSIS — Z9889 Other specified postprocedural states: Secondary | ICD-10-CM | POA: Diagnosis not present

## 2015-09-19 DIAGNOSIS — R0603 Acute respiratory distress: Secondary | ICD-10-CM

## 2015-09-19 DIAGNOSIS — Z79899 Other long term (current) drug therapy: Secondary | ICD-10-CM | POA: Insufficient documentation

## 2015-09-19 DIAGNOSIS — I700 Atherosclerosis of aorta: Secondary | ICD-10-CM | POA: Insufficient documentation

## 2015-09-19 DIAGNOSIS — Z8249 Family history of ischemic heart disease and other diseases of the circulatory system: Secondary | ICD-10-CM | POA: Diagnosis not present

## 2015-09-19 DIAGNOSIS — Z7982 Long term (current) use of aspirin: Secondary | ICD-10-CM | POA: Diagnosis not present

## 2015-09-19 DIAGNOSIS — F17210 Nicotine dependence, cigarettes, uncomplicated: Secondary | ICD-10-CM | POA: Diagnosis not present

## 2015-09-19 DIAGNOSIS — I2510 Atherosclerotic heart disease of native coronary artery without angina pectoris: Secondary | ICD-10-CM | POA: Insufficient documentation

## 2015-09-19 DIAGNOSIS — J439 Emphysema, unspecified: Secondary | ICD-10-CM | POA: Diagnosis not present

## 2015-09-19 DIAGNOSIS — E871 Hypo-osmolality and hyponatremia: Secondary | ICD-10-CM | POA: Diagnosis not present

## 2015-09-19 DIAGNOSIS — Z7984 Long term (current) use of oral hypoglycemic drugs: Secondary | ICD-10-CM | POA: Diagnosis not present

## 2015-09-19 DIAGNOSIS — E119 Type 2 diabetes mellitus without complications: Secondary | ICD-10-CM | POA: Diagnosis not present

## 2015-09-19 DIAGNOSIS — F17200 Nicotine dependence, unspecified, uncomplicated: Secondary | ICD-10-CM | POA: Insufficient documentation

## 2015-09-19 DIAGNOSIS — J441 Chronic obstructive pulmonary disease with (acute) exacerbation: Principal | ICD-10-CM | POA: Insufficient documentation

## 2015-09-19 DIAGNOSIS — Z716 Tobacco abuse counseling: Secondary | ICD-10-CM | POA: Diagnosis not present

## 2015-09-19 DIAGNOSIS — J9601 Acute respiratory failure with hypoxia: Secondary | ICD-10-CM | POA: Insufficient documentation

## 2015-09-19 DIAGNOSIS — R0600 Dyspnea, unspecified: Secondary | ICD-10-CM | POA: Diagnosis not present

## 2015-09-19 DIAGNOSIS — I10 Essential (primary) hypertension: Secondary | ICD-10-CM | POA: Insufficient documentation

## 2015-09-19 DIAGNOSIS — J209 Acute bronchitis, unspecified: Secondary | ICD-10-CM | POA: Diagnosis not present

## 2015-09-19 LAB — CBC WITH DIFFERENTIAL/PLATELET
BASOS ABS: 0.1 10*3/uL (ref 0–0.1)
Basophils Relative: 1 %
EOS PCT: 2 %
Eosinophils Absolute: 0.3 10*3/uL (ref 0–0.7)
HEMATOCRIT: 42.2 % (ref 40.0–52.0)
HEMOGLOBIN: 13.9 g/dL (ref 13.0–18.0)
LYMPHS ABS: 2.6 10*3/uL (ref 1.0–3.6)
LYMPHS PCT: 16 %
MCH: 26.2 pg (ref 26.0–34.0)
MCHC: 32.8 g/dL (ref 32.0–36.0)
MCV: 79.8 fL — AB (ref 80.0–100.0)
Monocytes Absolute: 1.6 10*3/uL — ABNORMAL HIGH (ref 0.2–1.0)
Monocytes Relative: 9 %
NEUTROS ABS: 12.1 10*3/uL — AB (ref 1.4–6.5)
NEUTROS PCT: 72 %
PLATELETS: 324 10*3/uL (ref 150–440)
RBC: 5.29 MIL/uL (ref 4.40–5.90)
RDW: 17.5 % — ABNORMAL HIGH (ref 11.5–14.5)
WBC: 16.7 10*3/uL — AB (ref 3.8–10.6)

## 2015-09-19 LAB — BLOOD GAS, ARTERIAL
ACID-BASE EXCESS: 0.3 mmol/L (ref 0.0–3.0)
Allens test (pass/fail): POSITIVE — AB
BICARBONATE: 24.6 meq/L (ref 21.0–28.0)
Expiratory PAP: 6
FIO2: 0.4
Inspiratory PAP: 14
Mode: POSITIVE
O2 Saturation: 99.2 %
PATIENT TEMPERATURE: 37
PCO2 ART: 38 mmHg (ref 32.0–48.0)
PH ART: 7.42 (ref 7.350–7.450)
RATE: 12 resp/min
pO2, Arterial: 138 mmHg — ABNORMAL HIGH (ref 83.0–108.0)

## 2015-09-19 LAB — COMPREHENSIVE METABOLIC PANEL
ALK PHOS: 76 U/L (ref 38–126)
ALT: 15 U/L — AB (ref 17–63)
AST: 16 U/L (ref 15–41)
Albumin: 3.8 g/dL (ref 3.5–5.0)
Anion gap: 7 (ref 5–15)
BUN: 16 mg/dL (ref 6–20)
CALCIUM: 9 mg/dL (ref 8.9–10.3)
CHLORIDE: 102 mmol/L (ref 101–111)
CO2: 25 mmol/L (ref 22–32)
CREATININE: 0.93 mg/dL (ref 0.61–1.24)
GFR calc Af Amer: >60 mL/min (ref >60)
Glucose, Bld: 110 mg/dL — ABNORMAL HIGH (ref 65–99)
Potassium: 4.5 mmol/L (ref 3.5–5.1)
Sodium: 134 mmol/L — ABNORMAL LOW (ref 135–145)
Total Bilirubin: 0.2 mg/dL — ABNORMAL LOW (ref 0.3–1.2)
Total Protein: 7 g/dL (ref 6.5–8.1)

## 2015-09-19 LAB — LACTIC ACID, PLASMA
Lactic Acid, Venous: 1.2 mmol/L (ref 0.5–1.9)
Lactic Acid, Venous: 1.6 mmol/L (ref 0.5–1.9)

## 2015-09-19 LAB — CBC
HEMATOCRIT: 42.1 % (ref 40.0–52.0)
HEMOGLOBIN: 13.9 g/dL (ref 13.0–18.0)
MCH: 26.6 pg (ref 26.0–34.0)
MCHC: 33 g/dL (ref 32.0–36.0)
MCV: 80.4 fL (ref 80.0–100.0)
Platelets: 311 10*3/uL (ref 150–440)
RBC: 5.23 MIL/uL (ref 4.40–5.90)
RDW: 17.4 % — ABNORMAL HIGH (ref 11.5–14.5)
WBC: 16.1 10*3/uL — ABNORMAL HIGH (ref 3.8–10.6)

## 2015-09-19 LAB — CREATININE, SERUM
CREATININE: 0.83 mg/dL (ref 0.61–1.24)
GFR calc Af Amer: >60 mL/min (ref >60)
GFR calc non Af Amer: >60 mL/min (ref >60)

## 2015-09-19 LAB — TSH: TSH: 3.665 u[IU]/mL (ref 0.350–4.500)

## 2015-09-19 LAB — BRAIN NATRIURETIC PEPTIDE: B Natriuretic Peptide: 45 pg/mL (ref 0.0–100.0)

## 2015-09-19 LAB — TROPONIN I: TROPONIN I: 0.04 ng/mL — AB (ref <0.03)

## 2015-09-19 LAB — GLUCOSE, CAPILLARY: GLUCOSE-CAPILLARY: 225 mg/dL — AB (ref 65–99)

## 2015-09-19 MED ORDER — METHYLPREDNISOLONE SODIUM SUCC 125 MG IJ SOLR
125.0000 mg | Freq: Once | INTRAMUSCULAR | Status: AC
  Administered 2015-09-19: 125 mg via INTRAVENOUS

## 2015-09-19 MED ORDER — TIOTROPIUM BROMIDE MONOHYDRATE 18 MCG IN CAPS
18.0000 ug | ORAL_CAPSULE | Freq: Every day | RESPIRATORY_TRACT | Status: DC
  Administered 2015-09-19 – 2015-09-20 (×2): 18 ug via RESPIRATORY_TRACT
  Filled 2015-09-19: qty 5

## 2015-09-19 MED ORDER — IPRATROPIUM-ALBUTEROL 0.5-2.5 (3) MG/3ML IN SOLN
3.0000 mL | Freq: Once | RESPIRATORY_TRACT | Status: AC
  Administered 2015-09-19: 3 mL via RESPIRATORY_TRACT

## 2015-09-19 MED ORDER — METOPROLOL TARTRATE 50 MG PO TABS
50.0000 mg | ORAL_TABLET | Freq: Two times a day (BID) | ORAL | Status: DC
  Administered 2015-09-19 – 2015-09-20 (×2): 50 mg via ORAL
  Filled 2015-09-19 (×2): qty 1

## 2015-09-19 MED ORDER — CLOPIDOGREL BISULFATE 75 MG PO TABS
75.0000 mg | ORAL_TABLET | Freq: Every day | ORAL | Status: DC
  Administered 2015-09-19 – 2015-09-20 (×2): 75 mg via ORAL
  Filled 2015-09-19 (×2): qty 1

## 2015-09-19 MED ORDER — MOMETASONE FURO-FORMOTEROL FUM 200-5 MCG/ACT IN AERO
2.0000 | INHALATION_SPRAY | Freq: Two times a day (BID) | RESPIRATORY_TRACT | Status: DC
  Administered 2015-09-19 – 2015-09-20 (×2): 2 via RESPIRATORY_TRACT
  Filled 2015-09-19: qty 8.8

## 2015-09-19 MED ORDER — SODIUM CHLORIDE 0.9 % IV SOLN
INTRAVENOUS | Status: DC
  Administered 2015-09-19 – 2015-09-20 (×2): via INTRAVENOUS

## 2015-09-19 MED ORDER — AZITHROMYCIN 250 MG PO TABS: 250.0000 mg | ORAL_TABLET | Freq: Every day | ORAL | Status: DC

## 2015-09-19 MED ORDER — SODIUM CHLORIDE 0.9% FLUSH: 3.0000 mL | Freq: Two times a day (BID) | INTRAVENOUS | Status: DC

## 2015-09-19 MED ORDER — LEVOFLOXACIN IN D5W 750 MG/150ML IV SOLN
750.0000 mg | Freq: Once | INTRAVENOUS | Status: AC
  Administered 2015-09-19: 750 mg via INTRAVENOUS
  Filled 2015-09-19: qty 150

## 2015-09-19 MED ORDER — INSULIN ASPART 100 UNIT/ML ~~LOC~~ SOLN
6.0000 [IU] | Freq: Three times a day (TID) | SUBCUTANEOUS | Status: DC
  Administered 2015-09-20 (×2): 6 [IU] via SUBCUTANEOUS
  Filled 2015-09-19 (×2): qty 6

## 2015-09-19 MED ORDER — METFORMIN HCL 500 MG PO TABS
500.0000 mg | ORAL_TABLET | Freq: Two times a day (BID) | ORAL | Status: DC
  Administered 2015-09-20: 500 mg via ORAL
  Filled 2015-09-19: qty 1

## 2015-09-19 MED ORDER — AZITHROMYCIN 500 MG PO TABS
500.0000 mg | ORAL_TABLET | Freq: Once | ORAL | Status: DC
  Filled 2015-09-19 (×2): qty 1

## 2015-09-19 MED ORDER — NICOTINE POLACRILEX 2 MG MT GUM
2.0000 mg | CHEWING_GUM | OROMUCOSAL | Status: DC | PRN
  Filled 2015-09-19: qty 1

## 2015-09-19 MED ORDER — METHYLPREDNISOLONE SODIUM SUCC 125 MG IJ SOLR
60.0000 mg | Freq: Two times a day (BID) | INTRAMUSCULAR | Status: DC
  Administered 2015-09-19 – 2015-09-20 (×2): 60 mg via INTRAVENOUS
  Filled 2015-09-19 (×2): qty 2

## 2015-09-19 MED ORDER — ACETAMINOPHEN 650 MG RE SUPP: 650.0000 mg | Freq: Four times a day (QID) | RECTAL | Status: DC | PRN

## 2015-09-19 MED ORDER — GUAIFENESIN ER 600 MG PO TB12
600.0000 mg | ORAL_TABLET | Freq: Two times a day (BID) | ORAL | Status: DC
  Administered 2015-09-19 – 2015-09-20 (×2): 600 mg via ORAL
  Filled 2015-09-19 (×2): qty 1

## 2015-09-19 MED ORDER — IPRATROPIUM-ALBUTEROL 0.5-2.5 (3) MG/3ML IN SOLN
RESPIRATORY_TRACT | Status: AC
  Administered 2015-09-19: 3 mL
  Filled 2015-09-19: qty 3

## 2015-09-19 MED ORDER — IPRATROPIUM-ALBUTEROL 0.5-2.5 (3) MG/3ML IN SOLN
RESPIRATORY_TRACT | Status: AC
  Administered 2015-09-19: 3 mL via RESPIRATORY_TRACT
  Filled 2015-09-19: qty 6

## 2015-09-19 MED ORDER — AZITHROMYCIN 250 MG PO TABS
ORAL_TABLET | ORAL | Status: AC
  Filled 2015-09-19: qty 2

## 2015-09-19 MED ORDER — SIMVASTATIN 20 MG PO TABS
20.0000 mg | ORAL_TABLET | Freq: Every day | ORAL | Status: DC
  Administered 2015-09-19: 22:00:00 20 mg via ORAL
  Filled 2015-09-19: qty 1

## 2015-09-19 MED ORDER — NICOTINE 21 MG/24HR TD PT24
21.0000 mg | MEDICATED_PATCH | Freq: Every day | TRANSDERMAL | Status: DC
  Administered 2015-09-19 – 2015-09-20 (×2): 21 mg via TRANSDERMAL
  Filled 2015-09-19 (×2): qty 1

## 2015-09-19 MED ORDER — NITROGLYCERIN 0.4 MG SL SUBL: 0.4000 mg | SUBLINGUAL_TABLET | SUBLINGUAL | Status: DC | PRN

## 2015-09-19 MED ORDER — METHYLPREDNISOLONE SODIUM SUCC 125 MG IJ SOLR
INTRAMUSCULAR | Status: AC
  Administered 2015-09-19: 125 mg via INTRAVENOUS
  Filled 2015-09-19: qty 2

## 2015-09-19 MED ORDER — ACETAMINOPHEN 325 MG PO TABS: 650.0000 mg | ORAL_TABLET | Freq: Four times a day (QID) | ORAL | Status: DC | PRN

## 2015-09-19 MED ORDER — ASPIRIN EC 81 MG PO TBEC
81.0000 mg | DELAYED_RELEASE_TABLET | Freq: Every day | ORAL | Status: DC
  Administered 2015-09-19 – 2015-09-20 (×2): 81 mg via ORAL
  Filled 2015-09-19 (×2): qty 1

## 2015-09-19 MED ORDER — ENALAPRIL MALEATE 10 MG PO TABS
10.0000 mg | ORAL_TABLET | Freq: Every day | ORAL | Status: DC
  Administered 2015-09-19: 10 mg via ORAL
  Filled 2015-09-19 (×2): qty 1

## 2015-09-19 MED ORDER — INSULIN ASPART 100 UNIT/ML ~~LOC~~ SOLN
0.0000 [IU] | Freq: Every day | SUBCUTANEOUS | Status: DC
  Administered 2015-09-19: 2 [IU] via SUBCUTANEOUS
  Filled 2015-09-19: qty 2

## 2015-09-19 MED ORDER — ALBUTEROL SULFATE (2.5 MG/3ML) 0.083% IN NEBU
2.5000 mg | INHALATION_SOLUTION | RESPIRATORY_TRACT | Status: DC
  Administered 2015-09-19 – 2015-09-20 (×4): 2.5 mg via RESPIRATORY_TRACT
  Filled 2015-09-19 (×4): qty 3

## 2015-09-19 MED ORDER — MIRTAZAPINE 15 MG PO TABS: 15.0000 mg | ORAL_TABLET | Freq: Every evening | ORAL | Status: DC | PRN

## 2015-09-19 MED ORDER — HYDROCOD POLST-CPM POLST ER 10-8 MG/5ML PO SUER
5.0000 mL | Freq: Two times a day (BID) | ORAL | Status: DC
  Administered 2015-09-19 – 2015-09-20 (×2): 5 mL via ORAL
  Filled 2015-09-19 (×2): qty 5

## 2015-09-19 MED ORDER — INSULIN ASPART 100 UNIT/ML ~~LOC~~ SOLN
0.0000 [IU] | Freq: Three times a day (TID) | SUBCUTANEOUS | Status: DC
  Administered 2015-09-20 (×2): 7 [IU] via SUBCUTANEOUS
  Filled 2015-09-19 (×2): qty 7

## 2015-09-19 MED ORDER — ENOXAPARIN SODIUM 40 MG/0.4ML ~~LOC~~ SOLN
40.0000 mg | SUBCUTANEOUS | Status: DC
  Administered 2015-09-19: 40 mg via SUBCUTANEOUS
  Filled 2015-09-19: qty 0.4

## 2015-09-19 NOTE — ED Provider Notes (Signed)
[facility] Emergency Department Provider Note   ____________________________________________  Time seen: Approximately 3:56 PM  I have reviewed the triage vital signs and the nursing notes.   HISTORY  Chief Complaint Shortness of Breath    HPI Bradley [name] is a 65 y.o. male with history of COPD, diabetes, coronary artery disease, hypertension who presents for evaluation of shortness of breath which began suddenly today, severe, no modifying factors. He reports that he might of inhaled a small amount of bug spray and perhaps at that triggered his trouble breathing. He has had runny nose and nasal congestion over the past several days but no chest pain. No fever. No abdominal pain and vomiting or diarrhea. On EMS arrival he was tripoding, in severe respiratory distress and CPAP was placed.   Past Medical History  Diagnosis Date  . COPD (chronic obstructive pulmonary disease) ([place])   . Diabetes mellitus without complication ([place])   . Coronary artery disease   . Hypertension     Patient Active Problem List   Diagnosis Date Noted  . Adjustment disorder with mixed anxiety and depressed mood 04/14/2015  . Insomnia 04/14/2015  . Grief 04/14/2015  . Hyponatremia 04/12/2015  . COPD exacerbation ([place]) 10/08/2014  . CAP (community acquired pneumonia) 10/08/2014  . Hypogammaglobulinemia ([place]) 08/17/2014  . Leukocytosis 08/03/2014  . Recurrent infections 08/03/2014    Past Surgical History  Procedure Laterality Date  . Lung biopsy Right     2006    Current Outpatient Rx  Name  Route  Sig  Dispense  Refill  . albuterol (PROVENTIL) (2.5 MG/3ML) 0.083% nebulizer solution   Nebulization   Take 2.5 mg by nebulization every 6 (six) hours as needed for wheezing or shortness of breath.         Marland Kitchen aspirin EC 81 MG tablet   Oral   Take 81 mg by mouth daily.         . clopidogrel (PLAVIX) 75 MG tablet   Oral   Take 75 mg by mouth daily.         .  enalapril (VASOTEC) 10 MG tablet   Oral   Take 10 mg by mouth daily.         . Fluticasone-Salmeterol (ADVAIR) 250-50 MCG/DOSE AEPB   Inhalation   Inhale 1 puff into the lungs 2 (two) times daily.         . metFORMIN (GLUCOPHAGE) 500 MG tablet   Oral   Take 500 mg by mouth 2 (two) times daily with a meal.          . metoprolol (LOPRESSOR) 50 MG tablet   Oral   Take 50 mg by mouth 2 (two) times daily.         . mirtazapine (REMERON) 15 MG tablet   Oral   Take 1 tablet (15 mg total) by mouth at bedtime. Patient taking differently: Take 15 mg by mouth at bedtime as needed (for sleep).    30 tablet   0   . nitroGLYCERIN (NITROSTAT) 0.4 MG SL tablet   Sublingual   Place 0.4 mg under the tongue every 5 (five) minutes as needed for chest pain.         . simvastatin (ZOCOR) 20 MG tablet   Oral   Take 20 mg by mouth at bedtime.          Marland Kitchen tiotropium (SPIRIVA) 18 MCG inhalation capsule   Inhalation   Place 18 mcg into inhaler and inhale  daily.           Allergies Review of patient's allergies indicates no known allergies.  Family History  Problem Relation Age of Onset  . Cancer Mother   . Heart attack Father   . Cancer Sister     Social History Social History  Substance Use Topics  . Smoking status: Current Every Day Smoker -- 0.75 packs/day  . Smokeless tobacco: Never Used  . Alcohol Use: No    Review of Systems Constitutional: No fever/chills Eyes: No visual changes. ENT: No sore throat. Cardiovascular: Denies chest pain. Respiratory: + shortness of breath. Gastrointestinal: No abdominal pain.  No nausea, no vomiting.  No diarrhea.  No constipation. Genitourinary: Negative for dysuria. Musculoskeletal: Negative for back pain. Skin: Negative for rash. Neurological: Negative for headaches, focal weakness or numbness.  10-point ROS otherwise negative.  ____________________________________________   PHYSICAL EXAM:  Filed Vitals:   09/19/15  1600 09/19/15 1604 09/19/15 1630 09/19/15 1700  BP:  117/88 101/63 93/62  Pulse:  97 90 92  Temp: 97.5 F (36.4 C)     TempSrc: Axillary     Resp:  '19 21 15  '$ SpO2:  99% 100% 98%    Temp 97.5 axillary  VITAL SIGNS: ED Triage Vitals  Enc Vitals Group     BP 09/19/15 1544 117/88 mmHg     Pulse Rate 09/19/15 1544 97     Resp 09/19/15 1544 16     Temp --      Temp src --      SpO2 09/19/15 1544 99 %     Weight --      Height --      Head Cir --      Peak Flow --      Pain Score --      Pain Loc --      Pain Edu? --      Excl. in [place]? --     Constitutional: Alert and oriented. In moderate to severe respiratory distress able to speak in short sentences. Eyes: Conjunctivae are normal. PERRL. EOMI. Head: Atraumatic. Nose: No congestion/rhinnorhea. Mouth/Throat: Mucous membranes are moist.  Oropharynx non-erythematous. Neck: No stridor.  Supple without meningitis. Cardiovascular: Normal rate, regular rhythm. Grossly normal heart sounds.  Good peripheral circulation. Respiratory: Tachypnea with increased work of breathing, diffusely rhonchorous breath sounds with expiratory wheeze. Gastrointestinal: Soft and nontender. No distention.  No CVA tenderness. Genitourinary: deferred Musculoskeletal: No lower extremity tenderness nor edema.  No joint effusions. Neurologic:  Normal speech and language. No gross focal neurologic deficits are appreciated. Skin:  Skin is warm, dry and intact. No rash noted. Psychiatric: Mood and affect are normal. Speech and behavior are normal.  ____________________________________________   LABS (all labs ordered are listed, but only abnormal results are displayed)  Labs Reviewed  CBC WITH DIFFERENTIAL/PLATELET - Abnormal; Notable for the following:    WBC 16.7 (*)    MCV 79.8 (*)    RDW 17.5 (*)    Neutro Abs 12.1 (*)    Monocytes Absolute 1.6 (*)    All other components within normal limits  COMPREHENSIVE METABOLIC PANEL - Abnormal; Notable  for the following:    Sodium 134 (*)    Glucose, Bld 110 (*)    ALT 15 (*)    Total Bilirubin 0.2 (*)    All other components within normal limits  BLOOD GAS, ARTERIAL - Abnormal; Notable for the following:    pO2, Arterial 138 (*)    Allens  test (pass/fail) POSITIVE (*)    All other components within normal limits  CULTURE, BLOOD (ROUTINE X 2)  CULTURE, BLOOD (ROUTINE X 2)  TROPONIN I  BRAIN NATRIURETIC PEPTIDE  LACTIC ACID, PLASMA  LACTIC ACID, PLASMA   ____________________________________________  EKG  ED ECG REPORT I, Joanne Gavel, the attending physician, personally viewed and interpreted this ECG.   Date: 09/19/2015  EKG Time: 15:47  Rate: 97  Rhythm: normal sinus rhythm  Axis: normal  Intervals:right bundle branch block  ST&T Change: No acute ST elevation or acute ST depression.  ____________________________________________  RADIOLOGY  CXR IMPRESSION: 1. Severe emphysema. Postoperative findings in the right lung, with chronic blunting of the right lateral costophrenic angle. 2. Atherosclerotic calcification of the aortic arch. ____________________________________________   PROCEDURES  Procedure(s) performed: None  Procedures  Critical Care performed: Yes, see critical care note(s).  CRITICAL CARE Performed by: Loura Pardon A   Total critical care time: 30 minutes  Critical care time was exclusive of separately billable procedures and treating other patients.  Critical care was necessary to treat or prevent imminent or life-threatening deterioration.  Critical care was time spent personally by me on the following activities: development of treatment plan with patient and/or surrogate as well as nursing, discussions with consultants, evaluation of patient's response to treatment, examination of patient, obtaining history from patient or surrogate, ordering and performing treatments and interventions, ordering and review of laboratory studies,  ordering and review of radiographic studies, pulse oximetry and re-evaluation of patient's condition.  ____________________________________________   INITIAL IMPRESSION / ASSESSMENT AND PLAN / ED COURSE  Pertinent labs & imaging results that were available during my care of the patient were reviewed by me and considered in my medical decision making (see chart for details).  Bradley [name] is a 65 y.o. male with history of COPD, diabetes, coronary artery disease, hypertension who presents for evaluation of shortness of breath which began suddenly today. On arrival to the emergency department he is in moderate to severe respiratory distress able to speak only in short sentences, diffuse expiratory wheeze with rhonchi concerning for acute COPD exacerbation. BiPAP treatment initiated with improvement of work of breathing. We'll obtain screening labs, EKG, chest x-ray, give DuoNeb treatments, Solu-Medrol, Levaquin and anticipate admission.   ----------------------------------------- 5:12 PM on 09/19/2015 ----------------------------------------- Patient was sitting improvement of his work of breathing, also BiPAP at this time. EKG not consistent with acute ischemia. CBC with leukocytosis, CMP generally unremarkable, negative troponin. Chest x-ray shows severe emphysema. I discussed the case with the hospitalist at this time for admission. ____________________________________________   FINAL CLINICAL IMPRESSION(S) / ED DIAGNOSES  Final diagnoses:  Chronic obstructive pulmonary disease with acute exacerbation (HCC)      NEW MEDICATIONS STARTED DURING THIS VISIT:  New Prescriptions   No medications on file     Note:  This document was prepared using Dragon voice recognition software and may include unintentional dictation errors.    Joanne Gavel, MD 09/19/15 [id number]

## 2015-09-19 NOTE — H&P (Signed)
[facility] [place] NAME: Bradley [name]    MR#:  [id number]  DATE OF BIRTH:  [birth year]  DATE OF ADMISSION:  09/19/2015  PRIMARY CARE PHYSICIAN: Valera Castle, MD   REQUESTING/REFERRING PHYSICIAN:   CHIEF COMPLAINT:   Chief Complaint  Patient presents with  . Shortness of Breath    HISTORY OF PRESENT ILLNESS: Bradley [name]  is a 65 y.o. male with a known history of COPD, diabetes mellitus, coronary artery disease, hypertension, who presents to the hospital with complaints of sudden onset of shortness of breath. According to patient, he was doing well up until today, when he reverted a bug spray 2 wives bedroom, approximately 30 minutes later he was gasping for air. He was coughing, dry cough, wheezing, very short of breath, even feeling presyncopal. He presented to emergency room for further evaluation. In treatment. He initially required BiPAP administration, now he was weaned off BiPAP, on oxygen through nasal cannulas, feels a little bit better. Hospitalist services were contacted for admission.   PAST MEDICAL HISTORY:   Past Medical History  Diagnosis Date  . COPD (chronic obstructive pulmonary disease) ([place])   . Diabetes mellitus without complication ([place])   . Coronary artery disease   . Hypertension     PAST SURGICAL HISTORY: Past Surgical History  Procedure Laterality Date  . Lung biopsy Right     2006    SOCIAL HISTORY:  Social History  Substance Use Topics  . Smoking status: Current Every Day Smoker -- 0.75 packs/day  . Smokeless tobacco: Never Used  . Alcohol Use: No    FAMILY HISTORY:  Family History  Problem Relation Age of Onset  . Cancer Mother   . Heart attack Father   . Cancer Sister     DRUG ALLERGIES: No Known Allergies  Review of Systems  Constitutional: Negative for fever, chills, weight loss and malaise/fatigue.  HENT: Positive for congestion.   Eyes: Negative for blurred vision and  double vision.  Respiratory: Positive for cough, shortness of breath and wheezing. Negative for sputum production.   Cardiovascular: Negative for chest pain, palpitations, orthopnea, leg swelling and PND.  Gastrointestinal: Negative for nausea, vomiting, abdominal pain, diarrhea, constipation, blood in stool and melena.  Genitourinary: Negative for dysuria, urgency, frequency and hematuria.  Musculoskeletal: Negative for falls.  Skin: Positive for itching and rash.  Neurological: Negative for dizziness and weakness.  Psychiatric/Behavioral: Negative for depression and memory loss. The patient is not nervous/anxious.     MEDICATIONS AT HOME:  Prior to Admission medications   Medication Sig Start Date End Date Taking? Authorizing Provider  albuterol (PROVENTIL) (2.5 MG/3ML) 0.083% nebulizer solution Take 2.5 mg by nebulization every 6 (six) hours as needed for wheezing or shortness of breath.   Yes Historical Provider, MD  aspirin EC 81 MG tablet Take 81 mg by mouth daily.   Yes Historical Provider, MD  clopidogrel (PLAVIX) 75 MG tablet Take 75 mg by mouth daily.   Yes Historical Provider, MD  enalapril (VASOTEC) 10 MG tablet Take 10 mg by mouth daily.   Yes Historical Provider, MD  Fluticasone-Salmeterol (ADVAIR) 250-50 MCG/DOSE AEPB Inhale 1 puff into the lungs 2 (two) times daily.   Yes Historical Provider, MD  metFORMIN (GLUCOPHAGE) 500 MG tablet Take 500 mg by mouth 2 (two) times daily with a meal.    Yes Historical Provider, MD  metoprolol (LOPRESSOR) 50 MG tablet Take 50 mg by mouth 2 (two) times daily.  Yes Historical Provider, MD  mirtazapine (REMERON) 15 MG tablet Take 1 tablet (15 mg total) by mouth at bedtime. Patient taking differently: Take 15 mg by mouth at bedtime as needed (for sleep).  04/16/15  Yes Dustin Flock, MD  nitroGLYCERIN (NITROSTAT) 0.4 MG SL tablet Place 0.4 mg under the tongue every 5 (five) minutes as needed for chest pain.   Yes Historical Provider, MD   simvastatin (ZOCOR) 20 MG tablet Take 20 mg by mouth at bedtime.    Yes Historical Provider, MD  tiotropium (SPIRIVA) 18 MCG inhalation capsule Place 18 mcg into inhaler and inhale daily.   Yes Historical Provider, MD      PHYSICAL EXAMINATION:   VITAL SIGNS: Blood pressure 93/62, pulse 92, temperature 97.5 F (36.4 C), temperature source Axillary, resp. rate 15, SpO2 98 %.  GENERAL:  65 y.o.-year-old patient lying in the bed In mild respiratory distress, tachypneic, but able to speak short sentences.  EYES: Pupils equal, round, reactive to light and accommodation. No scleral icterus. Extraocular muscles intact.  HEENT: Head atraumatic, normocephalic. Oropharynx and nasopharynx clear.  NECK:  Supple, no jugular venous distention. No thyroid enlargement, no tenderness.  LUNGS: Diminished breath sounds bilaterally, scattered wheezing, mostly on the left posteriorly, some rales,rhonchi , but no crepitations. Using accessory muscles of respiration, especially with exertion and speech.  CARDIOVASCULAR: S1, S2 normal. No murmurs, rubs, or gallops. Distant sounds  ABDOMEN: Soft, nontender, nondistended. Bowel sounds present. No organomegaly or mass.  EXTREMITIES: Trace to 1+ lower extremity and pedal edema, no cyanosis, or clubbing.  NEUROLOGIC: Cranial nerves II through XII are intact. Muscle strength 5/5 in all extremities. Sensation intact. Gait not checked.  PSYCHIATRIC: The patient is alert and oriented x 3.  SKIN: , lesion, or ulcer. Patient does have a rash, macular, nonblanching, pleuritic rash in the bilateral lower extremities below knees.   LABORATORY PANEL:   CBC  Recent Labs Lab 09/19/15 1548  WBC 16.7*  HGB 13.9  HCT 42.2  PLT 324  MCV 79.8*  MCH 26.2  MCHC 32.8  RDW 17.5*  LYMPHSABS 2.6  MONOABS 1.6*  EOSABS 0.3  BASOSABS 0.1   ------------------------------------------------------------------------------------------------------------------  Chemistries    Recent Labs Lab 09/19/15 1548  NA 134*  K 4.5  CL 102  CO2 25  GLUCOSE 110*  BUN 16  CREATININE 0.93  CALCIUM 9.0  AST 16  ALT 15*  ALKPHOS 76  BILITOT 0.2*   ------------------------------------------------------------------------------------------------------------------  Cardiac Enzymes  Recent Labs Lab 09/19/15 1548  TROPONINI <0.03   ------------------------------------------------------------------------------------------------------------------  RADIOLOGY: Dg Chest Portable 1 View  09/19/2015  CLINICAL DATA:  COPD. Pulmonary edema. Mild hypoxia. Shortness of breath. EXAM: PORTABLE CHEST 1 VIEW COMPARISON:  04/14/2015 FINDINGS: Severe emphysema. Chronic scarring, right upper lobe. Chronic blunting of the right lateral costophrenic angle. The clips along the right hilum. Atherosclerotic calcification of the aortic arch. Heart size within normal limits. IMPRESSION: 1. Severe emphysema. Postoperative findings in the right lung, with chronic blunting of the right lateral costophrenic angle. 2. Atherosclerotic calcification of the aortic arch. Electronically Signed   By: Van Clines M.D.   On: 09/19/2015 16:13    EKG: Orders placed or performed during the hospital encounter of 04/12/15  . ED EKG  . ED EKG  EKG in the emergency room revealed questionable A. fib with irregular rate of 97 bpm, right bundle branch block, anterolateral infarct, age indeterminant, nonspecific ST-T changes  IMPRESSION AND PLAN:  Active Problems:   COPD exacerbation ([place])  Acute respiratory distress (HCC)   Hyponatremia   Leukocytosis #1. Acute respiratory distress, admitted to a medical floor, continue oxygen therapy, wean off oxygen as tolerated #2. COPD exacerbation, initiate steroids, inhalation therapy, steroid inhalers, nebulizers, follow clinically #3. Acute bronchitis, start patient on Zithromax, get sputum cultures if possible, continue Tussionex and Humibid #4.  Hyponatremia, since patient on low rate IV fluids, check sodium level in the morning #5. Leukocytosis, follow with therapy, although it may be difficult due to steroid initiation #6. Tobacco abuse. Counseling, discussed this patient for approximately a 45 minutes, nicotine replacement therapy will be initiated, patient is agreeable, patient's total lifetime expenditure on tobacco were calculated at above $42,000 #7. Questionable atrial fibrillation, repeat EKG, stat, follow cardiac enzymes 3, could be related to respiratory distress   All the records are reviewed and case discussed with ED provider. Management plans discussed with the patient, family and they are in agreement.  CODE STATUS: Code Status History    Date Active Date Inactive Code Status Order ID Comments User Context   04/12/2015 10:23 AM 04/16/2015  8:48 PM Full Code [id number]  Vaughan Basta, MD Inpatient   10/08/2014  3:48 PM 10/11/2014  2:26 PM Full Code [id number]  Idelle Crouch, MD Inpatient       TOTAL TIME TAKING CARE OF THIS PATIENT: 50  minutes.    Theodoro Grist M.D on 09/19/2015 at 6:09 PM  Between 7am to 6pm - Pager - [phone] After 6pm go to [email or web address] - password [email or web address] [facility]  Office  [phone]  CC: Primary care physician; Valera Castle, MD

## 2015-09-19 NOTE — ED Notes (Signed)
Patient arrived by EMS from home. Patient called 911 and was unable to speak to dispatch. Fire department arrived and got 93% RA and still unable to speak. Patient has HX of asthma, COPD, pulmonary edema, and unknown CHF. Patient arrived to ED on bipap O2 sat 98%. EMS given 1 duoneb and b/p 140/75. Patient alert and oriented

## 2015-09-20 DIAGNOSIS — J209 Acute bronchitis, unspecified: Secondary | ICD-10-CM | POA: Diagnosis not present

## 2015-09-20 DIAGNOSIS — D72829 Elevated white blood cell count, unspecified: Secondary | ICD-10-CM | POA: Diagnosis not present

## 2015-09-20 DIAGNOSIS — J441 Chronic obstructive pulmonary disease with (acute) exacerbation: Secondary | ICD-10-CM | POA: Diagnosis not present

## 2015-09-20 DIAGNOSIS — R0600 Dyspnea, unspecified: Secondary | ICD-10-CM | POA: Diagnosis not present

## 2015-09-20 LAB — BLOOD CULTURE ID PANEL (REFLEXED)
Acinetobacter baumannii: NOT DETECTED
CANDIDA ALBICANS: NOT DETECTED
CANDIDA TROPICALIS: NOT DETECTED
Candida glabrata: NOT DETECTED
Candida krusei: NOT DETECTED
Candida parapsilosis: NOT DETECTED
Carbapenem resistance: NOT DETECTED
ENTEROBACTER CLOACAE COMPLEX: NOT DETECTED
ENTEROBACTERIACEAE SPECIES: NOT DETECTED
ENTEROCOCCUS SPECIES: NOT DETECTED
Escherichia coli: NOT DETECTED
HAEMOPHILUS INFLUENZAE: NOT DETECTED
Klebsiella oxytoca: NOT DETECTED
Klebsiella pneumoniae: NOT DETECTED
Listeria monocytogenes: NOT DETECTED
METHICILLIN RESISTANCE: NOT DETECTED
Neisseria meningitidis: NOT DETECTED
PROTEUS SPECIES: NOT DETECTED
PSEUDOMONAS AERUGINOSA: NOT DETECTED
STAPHYLOCOCCUS AUREUS BCID: NOT DETECTED
STAPHYLOCOCCUS SPECIES: DETECTED — AB
STREPTOCOCCUS PNEUMONIAE: NOT DETECTED
STREPTOCOCCUS PYOGENES: NOT DETECTED
Serratia marcescens: NOT DETECTED
Streptococcus agalactiae: NOT DETECTED
Streptococcus species: NOT DETECTED
VANCOMYCIN RESISTANCE: NOT DETECTED

## 2015-09-20 LAB — BASIC METABOLIC PANEL
Anion gap: 5 (ref 5–15)
BUN: 17 mg/dL (ref 6–20)
CHLORIDE: 103 mmol/L (ref 101–111)
CO2: 25 mmol/L (ref 22–32)
CREATININE: 0.64 mg/dL (ref 0.61–1.24)
Calcium: 8.8 mg/dL — ABNORMAL LOW (ref 8.9–10.3)
Glucose, Bld: 219 mg/dL — ABNORMAL HIGH (ref 65–99)
POTASSIUM: 4.7 mmol/L (ref 3.5–5.1)
SODIUM: 133 mmol/L — AB (ref 135–145)

## 2015-09-20 LAB — CBC
HEMATOCRIT: 37.6 % — AB (ref 40.0–52.0)
Hemoglobin: 12.7 g/dL — ABNORMAL LOW (ref 13.0–18.0)
MCH: 27 pg (ref 26.0–34.0)
MCHC: 33.7 g/dL (ref 32.0–36.0)
MCV: 80.3 fL (ref 80.0–100.0)
PLATELETS: 306 10*3/uL (ref 150–440)
RBC: 4.68 MIL/uL (ref 4.40–5.90)
RDW: 17.7 % — ABNORMAL HIGH (ref 11.5–14.5)
WBC: 11.3 10*3/uL — AB (ref 3.8–10.6)

## 2015-09-20 LAB — HEMOGLOBIN A1C: Hgb A1c MFr Bld: 7.3 % — ABNORMAL HIGH (ref 4.0–6.0)

## 2015-09-20 LAB — TROPONIN I
Troponin I: <0.03 ng/mL (ref <0.03)
Troponin I: <0.03 ng/mL (ref <0.03)

## 2015-09-20 LAB — GLUCOSE, CAPILLARY
GLUCOSE-CAPILLARY: 212 mg/dL — AB (ref 65–99)
Glucose-Capillary: 242 mg/dL — ABNORMAL HIGH (ref 65–99)

## 2015-09-20 MED ORDER — ALBUTEROL SULFATE (2.5 MG/3ML) 0.083% IN NEBU: 2.5000 mg | INHALATION_SOLUTION | RESPIRATORY_TRACT | Status: DC | PRN

## 2015-09-20 MED ORDER — TIOTROPIUM BROMIDE MONOHYDRATE 18 MCG IN CAPS: 18.0000 ug | ORAL_CAPSULE | Freq: Every day | RESPIRATORY_TRACT | Status: DC

## 2015-09-20 MED ORDER — FLUTICASONE-SALMETEROL 250-50 MCG/DOSE IN AEPB: 1.0000 | INHALATION_SPRAY | Freq: Two times a day (BID) | RESPIRATORY_TRACT | Status: DC

## 2015-09-20 MED ORDER — PREDNISONE 50 MG PO TABS: 50.0000 mg | ORAL_TABLET | Freq: Every day | ORAL | Status: DC

## 2015-09-20 NOTE — Care Management (Signed)
Admitted to [facility] with the diagnosis of acute respiratory distress. Lives alone. Daughter is [name] [phone]). No home Health. No skilled facility. Home oxygen through [facility] x 6 months,  2 liters per nasal cannula all the time. No falls. Good appetite. Takes care of all basic and instrumental activities of daily living himself, drives. Prescriptions are filled at [facility] in [place]. States he is having trouble with his inhalers. Information on Medication Management given to Mr. Donaway. Daughter will transport. Shelbie Ammons RN MSN CCM Care Management [phone]

## 2015-09-20 NOTE — Discharge Instructions (Signed)
°  DIET:  Cardiac diet  DISCHARGE CONDITION:  Stable  ACTIVITY:  Activity as tolerated  OXYGEN:  Home Oxygen: No.   Oxygen Delivery: room air  DISCHARGE LOCATION:  home   If you experience worsening of your admission symptoms, develop shortness of breath, life threatening emergency, suicidal or homicidal thoughts you must seek medical attention immediately by calling 911 or calling your MD immediately  if symptoms less severe.  You Must read complete instructions/literature along with all the possible adverse reactions/side effects for all the Medicines you take and that have been prescribed to you. Take any new Medicines after you have completely understood and accpet all the possible adverse reactions/side effects.   Please note  You were cared for by a hospitalist during your hospital stay. If you have any questions about your discharge medications or the care you received while you were in the hospital after you are discharged, you can call the unit and asked to speak with the hospitalist on call if the hospitalist that took care of you is not available. Once you are discharged, your primary care physician will handle any further medical issues. Please note that NO REFILLS for any discharge medications will be authorized once you are discharged, as it is imperative that you return to your primary care physician (or establish a relationship with a primary care physician if you do not have one) for your aftercare needs so that they can reassess your need for medications and monitor your lab values.   QUIT SMOKING

## 2015-09-20 NOTE — Progress Notes (Signed)
Discussed discharge instructions and medications with pt and daughter.  IV removed. All questions addressed. Pt transported home via car by his daughter.  Clarise Cruz, RN

## 2015-09-20 NOTE — Care Management (Signed)
Bradley [name] is in good spirit and ready to return home. Yesterday we visited with patient and family and had good conversations about life and his pleasure in the way the staff has served him while in our care. Bradley [name] said he was told he should be discharged to today and looks forward to leaving.

## 2015-09-21 NOTE — Discharge Summary (Signed)
[facility] at [place] NAME: Bradley [name]    MR#:  [id number]  DATE OF BIRTH:  October 04, 1950  DATE OF ADMISSION:  09/19/2015 ADMITTING PHYSICIAN: Theodoro Grist, MD  DATE OF DISCHARGE: 09/20/2015  2:05 PM  PRIMARY CARE PHYSICIAN: Valera Castle, MD   ADMISSION DIAGNOSIS:  Chronic obstructive pulmonary disease with acute exacerbation ([place]) [J44.1]  DISCHARGE DIAGNOSIS:  Active Problems:   Leukocytosis   COPD exacerbation ([place])   Hyponatremia   Acute respiratory distress ([place])   SECONDARY DIAGNOSIS:   Past Medical History  Diagnosis Date  . COPD (chronic obstructive pulmonary disease) ([place])   . Diabetes mellitus without complication ([place])   . Coronary artery disease   . Hypertension      ADMITTING HISTORY  HISTORY OF PRESENT ILLNESS: Bradley [name] is a 65 y.o. male with a known history of COPD, diabetes mellitus, coronary artery disease, hypertension, who presents to the hospital with complaints of sudden onset of shortness of breath. According to patient, he was doing well up until today, when he reverted a bug spray 2 wives bedroom, approximately 30 minutes later he was gasping for air. He was coughing, dry cough, wheezing, very short of breath, even feeling presyncopal. He presented to emergency room for further evaluation. In treatment. He initially required BiPAP administration, now he was weaned off BiPAP, on oxygen through nasal cannulas, feels a little bit better. Hospitalist services were contacted for admission.   HOSPITAL COURSE:   * Acute COPD exacerbation with acute hypoxic respiratory failure This improved quickly. Was likely due to irritants. Patient used for bugs at home. Counseled to avoid this spray in the future. Treated with IV Solu-Medrol and scheduled nebulizers in the hospital. He was initially on a BiPAP and was able to quickly transitioned to nasal cannula. He will be discharged home on oral prednisone.  He does have nebulizers at home. On day of discharge patient is on room air and saturating more than 94%.  Stable for discharge home.  CONSULTS OBTAINED:     DRUG ALLERGIES:  No Known Allergies  DISCHARGE MEDICATIONS:   Discharge Medication List as of 09/20/2015  1:42 PM    START taking these medications   Details  predniSONE (DELTASONE) 50 MG tablet Take 1 tablet (50 mg total) by mouth daily with breakfast., Starting 09/20/2015, Until Discontinued, Print      CONTINUE these medications which have CHANGED   Details  Fluticasone-Salmeterol (ADVAIR) 250-50 MCG/DOSE AEPB Inhale 1 puff into the lungs 2 (two) times daily., Starting 09/20/2015, Until Discontinued, Print    tiotropium (SPIRIVA) 18 MCG inhalation capsule Place 1 capsule (18 mcg total) into inhaler and inhale daily., Starting 09/20/2015, Until Discontinued, Print      CONTINUE these medications which have NOT CHANGED   Details  albuterol (PROVENTIL) (2.5 MG/3ML) 0.083% nebulizer solution Take 2.5 mg by nebulization every 6 (six) hours as needed for wheezing or shortness of breath., Until Discontinued, Historical Med    aspirin EC 81 MG tablet Take 81 mg by mouth daily., Until Discontinued, Historical Med    clopidogrel (PLAVIX) 75 MG tablet Take 75 mg by mouth daily., Until Discontinued, Historical Med    enalapril (VASOTEC) 10 MG tablet Take 10 mg by mouth daily., Until Discontinued, Historical Med    metFORMIN (GLUCOPHAGE) 500 MG tablet Take 500 mg by mouth 2 (two) times daily with a meal. , Until Discontinued, Historical Med    metoprolol (LOPRESSOR) 50 MG tablet Take 50  mg by mouth 2 (two) times daily., Until Discontinued, Historical Med    mirtazapine (REMERON) 15 MG tablet Take 1 tablet (15 mg total) by mouth at bedtime., Starting 04/16/2015, Until Discontinued, Normal    nitroGLYCERIN (NITROSTAT) 0.4 MG SL tablet Place 0.4 mg under the tongue every 5 (five) minutes as needed for chest pain., Until Discontinued,  Historical Med    simvastatin (ZOCOR) 20 MG tablet Take 20 mg by mouth at bedtime. , Until Discontinued, Historical Med        Today   VITAL SIGNS:  Blood pressure 108/56, pulse 82, temperature 98 F (36.7 C), temperature source Oral, resp. rate 16, height '5\' 7"'$  (1.702 m), weight 64.439 kg (142 lb 1 oz), SpO2 98 %.  I/O:  No intake or output data in the 24 hours ending 09/21/15 1216  PHYSICAL EXAMINATION:  Physical Exam  GENERAL:  65 y.o.-year-old patient lying in the bed with no acute distress.  LUNGS: Normal breath sounds bilaterally, no wheezing, rales,rhonchi or crepitation. No use of accessory muscles of respiration.  CARDIOVASCULAR: S1, S2 normal. No murmurs, rubs, or gallops.  ABDOMEN: Soft, non-tender, non-distended. Bowel sounds present. No organomegaly or mass.  NEUROLOGIC: Moves all 4 extremities. PSYCHIATRIC: The patient is alert and oriented x 3.  SKIN: No obvious rash, lesion, or ulcer.   DATA REVIEW:   CBC  Recent Labs Lab 09/20/15 0700  WBC 11.3*  HGB 12.7*  HCT 37.6*  PLT 306    Chemistries   Recent Labs Lab 09/19/15 1548  09/20/15 0700  NA 134*  --  133*  K 4.5  --  4.7  CL 102  --  103  CO2 25  --  25  GLUCOSE 110*  --  219*  BUN 16  --  17  CREATININE 0.93  < > 0.64  CALCIUM 9.0  --  8.8*  AST 16  --   --   ALT 15*  --   --   ALKPHOS 76  --   --   BILITOT 0.2*  --   --   < > = values in this interval not displayed.  Cardiac Enzymes  Recent Labs Lab 09/20/15 0700  TROPONINI <0.03    Microbiology Results  Results for orders placed or performed during the hospital encounter of 09/19/15  Blood culture (routine x 2)     Status: None (Preliminary result)   Collection Time: 09/19/15  3:40 PM  Result Value Ref Range Status   Specimen Description BLOOD RIGHT FATTY CASTS  Final   Special Requests BOTTLES DRAWN AEROBIC AND ANAEROBIC 2CCAERO,2CCANA  Final   Culture NO GROWTH 2 DAYS  Final   Report Status PENDING  Incomplete  Blood  culture (routine x 2)     Status: None (Preliminary result)   Collection Time: 09/19/15  3:48 PM  Result Value Ref Range Status   Specimen Description BLOOD RIGHT ARM  Final   Special Requests BOTTLES DRAWN AEROBIC AND ANAEROBIC 2CCAERO,2CCANA  Final   Culture  Setup Time   Final    GRAM POSITIVE COCCI AEROBIC BOTTLE ONLY CRITICAL RESULT CALLED TO, READ BACK BY AND VERIFIED WITH: NATE COOKSON ON 09/20/15 AT 2145 BY TLB CONFIRMED BY VKB/SRC Organism ID to follow    Culture   Final    GRAM POSITIVE COCCI AEROBIC BOTTLE ONLY IDENTIFICATION TO FOLLOW    Report Status PENDING  Incomplete  Blood Culture ID Panel (Reflexed)     Status: Abnormal   Collection Time: 09/19/15  3:48 PM  Result Value Ref Range Status   Enterococcus species NOT DETECTED NOT DETECTED Final   Vancomycin resistance NOT DETECTED NOT DETECTED Final   Listeria monocytogenes NOT DETECTED NOT DETECTED Final   Staphylococcus species DETECTED (A) NOT DETECTED Final    Comment: CRITICAL RESULT CALLED TO, READ BACK BY AND VERIFIED WITH: NATE COOKSON ON 09/20/15 AT 2145 BY TLB    Staphylococcus aureus NOT DETECTED NOT DETECTED Final   Methicillin resistance NOT DETECTED NOT DETECTED Final   Streptococcus species NOT DETECTED NOT DETECTED Final   Streptococcus agalactiae NOT DETECTED NOT DETECTED Final   Streptococcus pneumoniae NOT DETECTED NOT DETECTED Final   Streptococcus pyogenes NOT DETECTED NOT DETECTED Final   Acinetobacter baumannii NOT DETECTED NOT DETECTED Final   Enterobacteriaceae species NOT DETECTED NOT DETECTED Final   Enterobacter cloacae complex NOT DETECTED NOT DETECTED Final   Escherichia coli NOT DETECTED NOT DETECTED Final   Klebsiella oxytoca NOT DETECTED NOT DETECTED Final   Klebsiella pneumoniae NOT DETECTED NOT DETECTED Final   Proteus species NOT DETECTED NOT DETECTED Final   Serratia marcescens NOT DETECTED NOT DETECTED Final   Carbapenem resistance NOT DETECTED NOT DETECTED Final    Haemophilus influenzae NOT DETECTED NOT DETECTED Final   Neisseria meningitidis NOT DETECTED NOT DETECTED Final   Pseudomonas aeruginosa NOT DETECTED NOT DETECTED Final   Candida albicans NOT DETECTED NOT DETECTED Final   Candida glabrata NOT DETECTED NOT DETECTED Final   Candida krusei NOT DETECTED NOT DETECTED Final   Candida parapsilosis NOT DETECTED NOT DETECTED Final   Candida tropicalis NOT DETECTED NOT DETECTED Final    RADIOLOGY:  Dg Chest Portable 1 View  09/19/2015  CLINICAL DATA:  COPD. Pulmonary edema. Mild hypoxia. Shortness of breath. EXAM: PORTABLE CHEST 1 VIEW COMPARISON:  04/14/2015 FINDINGS: Severe emphysema. Chronic scarring, right upper lobe. Chronic blunting of the right lateral costophrenic angle. The clips along the right hilum. Atherosclerotic calcification of the aortic arch. Heart size within normal limits. IMPRESSION: 1. Severe emphysema. Postoperative findings in the right lung, with chronic blunting of the right lateral costophrenic angle. 2. Atherosclerotic calcification of the aortic arch. Electronically Signed   By: Van Clines M.D.   On: 09/19/2015 16:13    Follow up with PCP in 1 week.  Management plans discussed with the patient, family and they are in agreement.  CODE STATUS:  Code Status History    Date Active Date Inactive Code Status Order ID Comments User Context   09/19/2015  6:37 PM 09/20/2015  5:10 PM Full Code [id number]  Theodoro Grist, MD Inpatient   04/12/2015 10:23 AM 04/16/2015  8:48 PM Full Code [id number]  Vaughan Basta, MD Inpatient   10/08/2014  3:48 PM 10/11/2014  2:26 PM Full Code [id number]  Idelle Crouch, MD Inpatient      TOTAL TIME TAKING CARE OF THIS PATIENT ON DAY OF DISCHARGE: more than 30 minutes.   Hillary Bow R M.D on 09/21/2015 at 12:16 PM  Between 7am to 6pm - Pager - [phone]  After 6pm go to [email or web address] - password [email or web address] [facility]  Office  [phone]  CC: Primary care  physician; Valera Castle, MD  Note: This dictation was prepared with Dragon dictation along with smaller phrase technology. Any transcriptional errors that result from this process are unintentional.

## 2015-09-23 LAB — CULTURE, BLOOD (ROUTINE X 2)

## 2015-09-24 LAB — CULTURE, BLOOD (ROUTINE X 2): Culture: NO GROWTH

## 2015-09-27 DIAGNOSIS — E119 Type 2 diabetes mellitus without complications: Secondary | ICD-10-CM | POA: Diagnosis not present

## 2015-09-27 DIAGNOSIS — R609 Edema, unspecified: Secondary | ICD-10-CM | POA: Diagnosis not present

## 2015-09-27 DIAGNOSIS — I10 Essential (primary) hypertension: Secondary | ICD-10-CM | POA: Diagnosis not present

## 2015-09-27 DIAGNOSIS — J438 Other emphysema: Secondary | ICD-10-CM | POA: Diagnosis not present

## 2015-09-27 DIAGNOSIS — I2510 Atherosclerotic heart disease of native coronary artery without angina pectoris: Secondary | ICD-10-CM | POA: Diagnosis not present

## 2015-09-27 DIAGNOSIS — E782 Mixed hyperlipidemia: Secondary | ICD-10-CM | POA: Diagnosis not present

## 2015-10-15 DIAGNOSIS — R0600 Dyspnea, unspecified: Secondary | ICD-10-CM | POA: Diagnosis not present

## 2015-10-15 DIAGNOSIS — J449 Chronic obstructive pulmonary disease, unspecified: Secondary | ICD-10-CM | POA: Diagnosis not present

## 2015-11-08 DIAGNOSIS — R361 Hematospermia: Secondary | ICD-10-CM | POA: Diagnosis not present

## 2015-11-08 DIAGNOSIS — N451 Epididymitis: Secondary | ICD-10-CM | POA: Diagnosis not present

## 2015-11-08 DIAGNOSIS — Z23 Encounter for immunization: Secondary | ICD-10-CM | POA: Diagnosis not present

## 2015-11-15 DIAGNOSIS — J449 Chronic obstructive pulmonary disease, unspecified: Secondary | ICD-10-CM | POA: Diagnosis not present

## 2015-11-15 DIAGNOSIS — R0600 Dyspnea, unspecified: Secondary | ICD-10-CM | POA: Diagnosis not present

## 2015-12-07 DIAGNOSIS — J438 Other emphysema: Secondary | ICD-10-CM | POA: Diagnosis not present

## 2015-12-07 DIAGNOSIS — I10 Essential (primary) hypertension: Secondary | ICD-10-CM | POA: Diagnosis not present

## 2015-12-07 DIAGNOSIS — E119 Type 2 diabetes mellitus without complications: Secondary | ICD-10-CM | POA: Diagnosis not present

## 2015-12-07 DIAGNOSIS — R361 Hematospermia: Secondary | ICD-10-CM | POA: Diagnosis not present

## 2015-12-15 DIAGNOSIS — R0600 Dyspnea, unspecified: Secondary | ICD-10-CM | POA: Diagnosis not present

## 2015-12-15 DIAGNOSIS — J449 Chronic obstructive pulmonary disease, unspecified: Secondary | ICD-10-CM | POA: Diagnosis not present

## 2015-12-22 ENCOUNTER — Emergency Department
Admission: EM | Admit: 2015-12-22 | Discharge: 2015-12-22 | Disposition: A | Discharge status: Home / Self Care | Payer: PPO | Attending: Emergency Medicine | Admitting: Emergency Medicine

## 2015-12-22 ENCOUNTER — Emergency Department: Payer: PPO

## 2015-12-22 ENCOUNTER — Encounter: Payer: Self-pay | Admitting: Emergency Medicine

## 2015-12-22 DIAGNOSIS — Z7982 Long term (current) use of aspirin: Secondary | ICD-10-CM | POA: Diagnosis not present

## 2015-12-22 DIAGNOSIS — I10 Essential (primary) hypertension: Secondary | ICD-10-CM | POA: Diagnosis not present

## 2015-12-22 DIAGNOSIS — J441 Chronic obstructive pulmonary disease with (acute) exacerbation: Secondary | ICD-10-CM | POA: Insufficient documentation

## 2015-12-22 DIAGNOSIS — Z7984 Long term (current) use of oral hypoglycemic drugs: Secondary | ICD-10-CM | POA: Diagnosis not present

## 2015-12-22 DIAGNOSIS — Z79899 Other long term (current) drug therapy: Secondary | ICD-10-CM | POA: Insufficient documentation

## 2015-12-22 DIAGNOSIS — R0600 Dyspnea, unspecified: Secondary | ICD-10-CM | POA: Diagnosis not present

## 2015-12-22 DIAGNOSIS — F4323 Adjustment disorder with mixed anxiety and depressed mood: Secondary | ICD-10-CM | POA: Diagnosis not present

## 2015-12-22 DIAGNOSIS — F17200 Nicotine dependence, unspecified, uncomplicated: Secondary | ICD-10-CM | POA: Diagnosis not present

## 2015-12-22 DIAGNOSIS — E119 Type 2 diabetes mellitus without complications: Secondary | ICD-10-CM | POA: Insufficient documentation

## 2015-12-22 DIAGNOSIS — I2510 Atherosclerotic heart disease of native coronary artery without angina pectoris: Secondary | ICD-10-CM | POA: Insufficient documentation

## 2015-12-22 DIAGNOSIS — R0602 Shortness of breath: Secondary | ICD-10-CM | POA: Diagnosis not present

## 2015-12-22 DIAGNOSIS — F17210 Nicotine dependence, cigarettes, uncomplicated: Secondary | ICD-10-CM | POA: Diagnosis not present

## 2015-12-22 LAB — CBC
HCT: 39.3 % — ABNORMAL LOW (ref 40.0–52.0)
HEMOGLOBIN: 12.9 g/dL — AB (ref 13.0–18.0)
MCH: 26.3 pg (ref 26.0–34.0)
MCHC: 32.8 g/dL (ref 32.0–36.0)
MCV: 80.1 fL (ref 80.0–100.0)
PLATELETS: 382 10*3/uL (ref 150–440)
RBC: 4.9 MIL/uL (ref 4.40–5.90)
RDW: 16.6 % — ABNORMAL HIGH (ref 11.5–14.5)
WBC: 18.2 10*3/uL — AB (ref 3.8–10.6)

## 2015-12-22 LAB — BASIC METABOLIC PANEL
ANION GAP: 10 (ref 5–15)
BUN: 17 mg/dL (ref 6–20)
CALCIUM: 9.3 mg/dL (ref 8.9–10.3)
CO2: 25 mmol/L (ref 22–32)
CREATININE: 0.64 mg/dL (ref 0.61–1.24)
Chloride: 96 mmol/L — ABNORMAL LOW (ref 101–111)
Glucose, Bld: 119 mg/dL — ABNORMAL HIGH (ref 65–99)
Potassium: 4.4 mmol/L (ref 3.5–5.1)
SODIUM: 131 mmol/L — AB (ref 135–145)

## 2015-12-22 LAB — TROPONIN I

## 2015-12-22 MED ORDER — IPRATROPIUM-ALBUTEROL 0.5-2.5 (3) MG/3ML IN SOLN
3.0000 mL | Freq: Once | RESPIRATORY_TRACT | Status: AC
  Administered 2015-12-22: 3 mL via RESPIRATORY_TRACT
  Filled 2015-12-22: qty 3

## 2015-12-22 MED ORDER — METHYLPREDNISOLONE SODIUM SUCC 125 MG IJ SOLR
125.0000 mg | Freq: Once | INTRAMUSCULAR | Status: AC
  Administered 2015-12-22: 125 mg via INTRAVENOUS
  Filled 2015-12-22: qty 2

## 2015-12-22 MED ORDER — ALBUTEROL SULFATE (2.5 MG/3ML) 0.083% IN NEBU
INHALATION_SOLUTION | RESPIRATORY_TRACT | Status: AC
  Filled 2015-12-22: qty 6

## 2015-12-22 MED ORDER — PREDNISONE 20 MG PO TABS: 60.0000 mg | ORAL_TABLET | Freq: Every day | ORAL | 0 refills | Status: DC

## 2015-12-22 MED ORDER — ALBUTEROL SULFATE (2.5 MG/3ML) 0.083% IN NEBU
5.0000 mg | INHALATION_SOLUTION | Freq: Once | RESPIRATORY_TRACT | Status: AC
  Administered 2015-12-22: 5 mg via RESPIRATORY_TRACT

## 2015-12-22 NOTE — ED Notes (Signed)
Continues to have insp and exp wheezing but improved air movement post neb tx.

## 2015-12-22 NOTE — ED Provider Notes (Signed)
[facility] Emergency Department Provider Note  ____________________________________________   First MD Initiated Contact with Patient 12/22/15 1904     (approximate)  I have reviewed the triage vital signs and the nursing notes.   HISTORY  Chief Complaint Shortness of Breath    HPI Bradley [name] is a 65 y.o. male with COPD on home oxygen who presents for evaluation of gradually worsening shortness of breath over the last 3 days.  He has been using his nebs but they have not been helping.  He has had to turn up the amount of oxygen to about 2-1/2 L from his usual one and a half.  His oxygen saturation was 93% on room air when he first arrived to the ED and it is in the upper 90s on 2 L at rest.  He does state that ambulation and exertion makes his symptoms worse.  It feels similar to his prior exacerbations.  He denies fever/chills, chest pain, abdominal pain, nausea, vomiting, diarrhea, dysuria.  He describes the symptoms as severe.  Currently at rest he is not in any distress.   Past Medical History:  Diagnosis Date  . COPD (chronic obstructive pulmonary disease) ([place])   . Coronary artery disease   . Diabetes mellitus without complication ([place])   . Hypertension     Patient Active Problem List   Diagnosis Date Noted  . Acute respiratory distress 09/19/2015  . Adjustment disorder with mixed anxiety and depressed mood 04/14/2015  . Insomnia 04/14/2015  . Grief 04/14/2015  . Hyponatremia 04/12/2015  . COPD exacerbation ([place]) 10/08/2014  . CAP (community acquired pneumonia) 10/08/2014  . Hypogammaglobulinemia ([place]) 08/17/2014  . Leukocytosis 08/03/2014  . Recurrent infections 08/03/2014    Past Surgical History:  Procedure Laterality Date  . LUNG BIOPSY Right    2006    Prior to Admission medications   Medication Sig Start Date End Date Taking? Authorizing Provider  albuterol (PROVENTIL) (2.5 MG/3ML) 0.083% nebulizer solution Take 2.5 mg by  nebulization every 6 (six) hours as needed for wheezing or shortness of breath.   Yes Historical Provider, MD  aspirin EC 81 MG tablet Take 81 mg by mouth daily.   Yes Historical Provider, MD  clopidogrel (PLAVIX) 75 MG tablet Take 75 mg by mouth daily.   Yes Historical Provider, MD  enalapril (VASOTEC) 10 MG tablet Take 10 mg by mouth daily.   Yes Historical Provider, MD  Fluticasone-Salmeterol (ADVAIR) 250-50 MCG/DOSE AEPB Inhale 1 puff into the lungs 2 (two) times daily. 09/20/15  Yes Srikar Sudini, MD  metFORMIN (GLUCOPHAGE) 500 MG tablet Take 500 mg by mouth daily. Takes every morning and then takes another one in the evening if his "sugar starts acting up"   Yes Historical Provider, MD  metoprolol (LOPRESSOR) 50 MG tablet Take 50 mg by mouth 2 (two) times daily.   Yes Historical Provider, MD  mirtazapine (REMERON) 15 MG tablet Take 1 tablet (15 mg total) by mouth at bedtime. Patient taking differently: Take 15 mg by mouth at bedtime as needed (for sleep).  04/16/15  Yes Dustin Flock, MD  nitroGLYCERIN (NITROSTAT) 0.4 MG SL tablet Place 0.4 mg under the tongue every 5 (five) minutes as needed for chest pain.   Yes Historical Provider, MD  simvastatin (ZOCOR) 20 MG tablet Take 20 mg by mouth at bedtime.    Yes Historical Provider, MD  tiotropium (SPIRIVA) 18 MCG inhalation capsule Place 1 capsule (18 mcg total) into inhaler and inhale daily. 09/20/15  Yes Hillary Bow, MD  predniSONE (DELTASONE) 20 MG tablet Take 3 tablets (60 mg total) by mouth daily. 12/22/15   Hinda Kehr, MD    Allergies Review of patient's allergies indicates no known allergies.  Family History  Problem Relation Age of Onset  . Cancer Mother   . Heart attack Father   . Cancer Sister     Social History Social History  Substance Use Topics  . Smoking status: Current Every Day Smoker    Packs/day: 0.50  . Smokeless tobacco: Never Used  . Alcohol use No    Review of Systems Constitutional: No  fever/chills Eyes: No visual changes. ENT: No sore throat. Cardiovascular: Denies chest pain. Respiratory: +shortness of breath. Gastrointestinal: No abdominal pain.  No nausea, no vomiting.  No diarrhea.  No constipation. Genitourinary: Negative for dysuria. Musculoskeletal: Negative for back pain. Skin: Negative for rash. Neurological: Negative for headaches, focal weakness or numbness.  10-point ROS otherwise negative.  ____________________________________________   PHYSICAL EXAM:  VITAL SIGNS: ED Triage Vitals  Enc Vitals Group     BP 12/22/15 1808 135/69     Pulse Rate 12/22/15 1800 86     Resp 12/22/15 1800 (!) 24     Temp 12/22/15 1808 98 F (36.7 C)     Temp Source 12/22/15 1808 Oral     SpO2 12/22/15 1800 96 %     Weight 12/22/15 1801 145 lb (65.8 kg)     Height 12/22/15 1801 '5\' 7"'$  (1.702 m)     Head Circumference --      Peak Flow --      Pain Score 12/22/15 1801 0     Pain Loc --      Pain Edu? --      Excl. in [place]? --     Constitutional: Alert and oriented. Well appearing and in no acute distress. Has the appearance of chronic COPD.  Smells of tobacco smoke Eyes: Conjunctivae are normal. PERRL. EOMI. Head: Atraumatic. Nose: No congestion/rhinnorhea. Mouth/Throat: Mucous membranes are moist.  Oropharynx non-erythematous. Neck: No stridor.  No meningeal signs.   Cardiovascular: Normal rate, regular rhythm. Good peripheral circulation. Grossly normal heart sounds. Respiratory: Normal respiratory effort.  No retractions.  Expiratory wheezing throughout.   Gastrointestinal: Soft and nontender. No distention.  Musculoskeletal: No lower extremity tenderness nor edema. No gross deformities of extremities. Neurologic:  Normal speech and language. No gross focal neurologic deficits are appreciated.  Skin:  Skin is warm, dry and intact. No rash noted. Psychiatric: Mood and affect are normal. Speech and behavior are  normal.  ____________________________________________   LABS (all labs ordered are listed, but only abnormal results are displayed)  Labs Reviewed  BASIC METABOLIC PANEL - Abnormal; Notable for the following:       Result Value   Sodium 131 (*)    Chloride 96 (*)    Glucose, Bld 119 (*)    All other components within normal limits  CBC - Abnormal; Notable for the following:    WBC 18.2 (*)    Hemoglobin 12.9 (*)    HCT 39.3 (*)    RDW 16.6 (*)    All other components within normal limits  TROPONIN I   ____________________________________________  EKG  ED ECG REPORT I, Phebe Dettmer, the attending physician, personally viewed and interpreted this ECG.  Date: 12/22/2015 EKG Time: 18:03 Rate: 86 Rhythm: normal sinus rhythm QRS Axis: normal Intervals: Right bundle branch block ST/T Wave abnormalities: Non-specific ST segment / T-wave changes, but  no evidence of acute ischemia. Conduction Disturbances: none Narrative Interpretation: unremarkable and essentially unchanged from prior EKGs  ____________________________________________  RADIOLOGY   Dg Chest 2 View  Result Date: 12/22/2015 CLINICAL DATA:  Dyspnea for the past 3 days. Dyspnea with exertion. Oxygen dependent at home on 2 L. EXAM: CHEST  2 VIEW COMPARISON:  09/19/2015 and 04/12/2015 FINDINGS: Emphysematous hyperinflation of both lungs with right apical and bilateral perihilar scarring more so on the right. Surgical clips project over the right hilum. Chronic blunting the right costophrenic angle likely from hyperinflation. Atherosclerosis of the aortic arch. Cardiac size is normal. No acute osseous abnormality. No pneumonic consolidations. No pneumothorax. No evidence of CHF. IMPRESSION: Emphysematous hyperinflation of the lungs unchanged in appearance with right apical and bilateral perihilar scarring more so on the right. Electronically Signed   By: Ashley Royalty M.D.   On: 12/22/2015 18:43     ____________________________________________   PROCEDURES  Procedure(s) performed:   Procedures   Critical Care performed: No ____________________________________________   INITIAL IMPRESSION / ASSESSMENT AND PLAN / ED COURSE  Pertinent labs & imaging results that were available during my care of the patient were reviewed by me and considered in my medical decision making (see chart for details).  The patient is having an acute exacerbation of his chronic COPD.  He reports he has not been on steroids for at least 3 months.  I am giving him a dose of Solu-Medrol in the emergency department and will give him a course of prednisone.  I am giving him another 3 DuoNeb labs here in the emergency department but I do not believe he needs inpatient treatment.  He agrees with the plan at this time.   Clinical Course  Comment By Time  The patient is feeling much better after the duo nebs and he states he feels like he is ready to go home and follow-up as an outpatient.  I will give him prednisone.I gave my usual and customary return precautions.  Hinda Kehr, MD 10/20 2033    ____________________________________________  FINAL CLINICAL IMPRESSION(S) / ED DIAGNOSES  Final diagnoses:  COPD exacerbation ([place])     MEDICATIONS GIVEN DURING THIS VISIT:  Medications  albuterol (PROVENTIL) (2.5 MG/3ML) 0.083% nebulizer solution (  Not Given 12/22/15 1810)  albuterol (PROVENTIL) (2.5 MG/3ML) 0.083% nebulizer solution 5 mg (5 mg Nebulization Given 12/22/15 1807)  ipratropium-albuterol (DUONEB) 0.5-2.5 (3) MG/3ML nebulizer solution 3 mL (3 mLs Nebulization Given 12/22/15 1912)  methylPREDNISolone sodium succinate (SOLU-MEDROL) 125 mg/2 mL injection 125 mg (125 mg Intravenous Given 12/22/15 1913)  ipratropium-albuterol (DUONEB) 0.5-2.5 (3) MG/3ML nebulizer solution 3 mL (3 mLs Nebulization Given 12/22/15 1918)  ipratropium-albuterol (DUONEB) 0.5-2.5 (3) MG/3ML nebulizer solution 3 mL (3  mLs Nebulization Given 12/22/15 1918)     NEW OUTPATIENT MEDICATIONS STARTED DURING THIS VISIT:  New Prescriptions   PREDNISONE (DELTASONE) 20 MG TABLET    Take 3 tablets (60 mg total) by mouth daily.    Modified Medications   No medications on file    Discontinued Medications   PREDNISONE (DELTASONE) 50 MG TABLET    Take 1 tablet (50 mg total) by mouth daily with breakfast.     Note:  This document was prepared using Dragon voice recognition software and may include unintentional dictation errors.    Hinda Kehr, MD 12/22/15 2036

## 2015-12-22 NOTE — ED Triage Notes (Signed)
Pt arrived from home with c/o shortness of breath for the past 3 days. Pt states he has been using nebs every 4 hours for the past 3 days. Pt states he has shortness of breat worse with exertion. Uses oxygen at home as needed 2L. 93% on RA on arrival to ED. Pt placed on 2L [place] oxygen up to 96%. Pt states when he was walking at home his oxygen was in 80s at home.

## 2015-12-22 NOTE — Discharge Instructions (Signed)
We believe that your symptoms are caused today by an exacerbation of your COPD, and possibly bronchitis.  Please take the prescribed medications and any medications that you have at home for your COPD.  Follow up with your doctor as recommended.  If you develop any new or worsening symptoms, including but not limited to fever, persistent vomiting, worsening shortness of breath, or other symptoms that concern you, please return to the Emergency Department immediately. ° °

## 2015-12-25 DIAGNOSIS — R05 Cough: Secondary | ICD-10-CM | POA: Diagnosis not present

## 2015-12-25 DIAGNOSIS — R0609 Other forms of dyspnea: Secondary | ICD-10-CM | POA: Diagnosis not present

## 2015-12-25 DIAGNOSIS — J449 Chronic obstructive pulmonary disease, unspecified: Secondary | ICD-10-CM | POA: Diagnosis not present

## 2015-12-25 DIAGNOSIS — R0902 Hypoxemia: Secondary | ICD-10-CM | POA: Diagnosis not present

## 2015-12-27 ENCOUNTER — Encounter: Payer: Self-pay | Admitting: Emergency Medicine

## 2015-12-27 ENCOUNTER — Emergency Department: Payer: PPO

## 2015-12-27 ENCOUNTER — Inpatient Hospital Stay
Admission: EM | Admit: 2015-12-27 | Discharge: 2015-12-30 | DRG: 189 | Disposition: A | Discharge status: Home / Self Care | Payer: PPO | Attending: Internal Medicine | Admitting: Internal Medicine

## 2015-12-27 ENCOUNTER — Ambulatory Visit (INDEPENDENT_AMBULATORY_CARE_PROVIDER_SITE_OTHER)
Admission: EM | Admit: 2015-12-27 | Discharge: 2015-12-27 | Disposition: A | Discharge status: Home / Self Care | Payer: PPO | Source: Home / Self Care | Attending: Family Medicine | Admitting: Family Medicine

## 2015-12-27 DIAGNOSIS — J9601 Acute respiratory failure with hypoxia: Secondary | ICD-10-CM

## 2015-12-27 DIAGNOSIS — J441 Chronic obstructive pulmonary disease with (acute) exacerbation: Secondary | ICD-10-CM

## 2015-12-27 DIAGNOSIS — Z7951 Long term (current) use of inhaled steroids: Secondary | ICD-10-CM | POA: Diagnosis not present

## 2015-12-27 DIAGNOSIS — I2510 Atherosclerotic heart disease of native coronary artery without angina pectoris: Secondary | ICD-10-CM | POA: Diagnosis present

## 2015-12-27 DIAGNOSIS — J189 Pneumonia, unspecified organism: Secondary | ICD-10-CM

## 2015-12-27 DIAGNOSIS — E1110 Type 2 diabetes mellitus with ketoacidosis without coma: Secondary | ICD-10-CM

## 2015-12-27 DIAGNOSIS — Z7902 Long term (current) use of antithrombotics/antiplatelets: Secondary | ICD-10-CM

## 2015-12-27 DIAGNOSIS — R0602 Shortness of breath: Secondary | ICD-10-CM

## 2015-12-27 DIAGNOSIS — Z8249 Family history of ischemic heart disease and other diseases of the circulatory system: Secondary | ICD-10-CM | POA: Diagnosis not present

## 2015-12-27 DIAGNOSIS — Z809 Family history of malignant neoplasm, unspecified: Secondary | ICD-10-CM

## 2015-12-27 DIAGNOSIS — I10 Essential (primary) hypertension: Secondary | ICD-10-CM | POA: Diagnosis present

## 2015-12-27 DIAGNOSIS — R0600 Dyspnea, unspecified: Secondary | ICD-10-CM | POA: Diagnosis not present

## 2015-12-27 DIAGNOSIS — D72829 Elevated white blood cell count, unspecified: Secondary | ICD-10-CM | POA: Diagnosis not present

## 2015-12-27 DIAGNOSIS — F17210 Nicotine dependence, cigarettes, uncomplicated: Secondary | ICD-10-CM | POA: Diagnosis not present

## 2015-12-27 DIAGNOSIS — Z716 Tobacco abuse counseling: Secondary | ICD-10-CM | POA: Diagnosis not present

## 2015-12-27 DIAGNOSIS — E871 Hypo-osmolality and hyponatremia: Secondary | ICD-10-CM | POA: Diagnosis present

## 2015-12-27 DIAGNOSIS — R1030 Lower abdominal pain, unspecified: Secondary | ICD-10-CM

## 2015-12-27 DIAGNOSIS — E8770 Fluid overload, unspecified: Secondary | ICD-10-CM | POA: Diagnosis present

## 2015-12-27 DIAGNOSIS — Z7982 Long term (current) use of aspirin: Secondary | ICD-10-CM | POA: Diagnosis not present

## 2015-12-27 DIAGNOSIS — Z7984 Long term (current) use of oral hypoglycemic drugs: Secondary | ICD-10-CM

## 2015-12-27 DIAGNOSIS — R05 Cough: Secondary | ICD-10-CM | POA: Diagnosis not present

## 2015-12-27 DIAGNOSIS — J9621 Acute and chronic respiratory failure with hypoxia: Secondary | ICD-10-CM | POA: Diagnosis not present

## 2015-12-27 DIAGNOSIS — Z7952 Long term (current) use of systemic steroids: Secondary | ICD-10-CM

## 2015-12-27 DIAGNOSIS — F17200 Nicotine dependence, unspecified, uncomplicated: Secondary | ICD-10-CM | POA: Diagnosis present

## 2015-12-27 DIAGNOSIS — M6281 Muscle weakness (generalized): Secondary | ICD-10-CM

## 2015-12-27 DIAGNOSIS — E1140 Type 2 diabetes mellitus with diabetic neuropathy, unspecified: Secondary | ICD-10-CM | POA: Diagnosis present

## 2015-12-27 DIAGNOSIS — J440 Chronic obstructive pulmonary disease with acute lower respiratory infection: Secondary | ICD-10-CM | POA: Diagnosis present

## 2015-12-27 LAB — CBC WITH DIFFERENTIAL/PLATELET
BASOS ABS: 0.1 10*3/uL (ref 0–0.1)
BASOS PCT: 0 %
EOS ABS: 0 10*3/uL (ref 0–0.7)
EOS PCT: 0 %
HCT: 39.5 % — ABNORMAL LOW (ref 40.0–52.0)
Hemoglobin: 12.7 g/dL — ABNORMAL LOW (ref 13.0–18.0)
LYMPHS PCT: 6 %
Lymphs Abs: 1 10*3/uL (ref 1.0–3.6)
MCH: 25.8 pg — ABNORMAL LOW (ref 26.0–34.0)
MCHC: 32.1 g/dL (ref 32.0–36.0)
MCV: 80.1 fL (ref 80.0–100.0)
Monocytes Absolute: 1.1 10*3/uL — ABNORMAL HIGH (ref 0.2–1.0)
Monocytes Relative: 6 %
Neutro Abs: 15.3 10*3/uL — ABNORMAL HIGH (ref 1.4–6.5)
Neutrophils Relative %: 88 %
PLATELETS: 423 10*3/uL (ref 150–440)
RBC: 4.93 MIL/uL (ref 4.40–5.90)
RDW: 16.1 % — AB (ref 11.5–14.5)
WBC: 17.6 10*3/uL — AB (ref 3.8–10.6)

## 2015-12-27 LAB — TROPONIN I: Troponin I: <0.03 ng/mL (ref <0.03)

## 2015-12-27 LAB — EXPECTORATED SPUTUM ASSESSMENT W GRAM STAIN, RFLX TO RESP C

## 2015-12-27 LAB — EXPECTORATED SPUTUM ASSESSMENT W REFEX TO RESP CULTURE

## 2015-12-27 LAB — GLUCOSE, CAPILLARY
GLUCOSE-CAPILLARY: 264 mg/dL — AB (ref 65–99)
GLUCOSE-CAPILLARY: 249 mg/dL — AB (ref 65–99)

## 2015-12-27 LAB — BASIC METABOLIC PANEL
ANION GAP: 10 (ref 5–15)
BUN: 14 mg/dL (ref 6–20)
CALCIUM: 9.1 mg/dL (ref 8.9–10.3)
CO2: 25 mmol/L (ref 22–32)
Chloride: 95 mmol/L — ABNORMAL LOW (ref 101–111)
Creatinine, Ser: 0.55 mg/dL — ABNORMAL LOW (ref 0.61–1.24)
Glucose, Bld: 191 mg/dL — ABNORMAL HIGH (ref 65–99)
POTASSIUM: 4.2 mmol/L (ref 3.5–5.1)
SODIUM: 130 mmol/L — AB (ref 135–145)

## 2015-12-27 LAB — MRSA PCR SCREENING: MRSA BY PCR: NEGATIVE

## 2015-12-27 LAB — TSH: TSH: 2.855 u[IU]/mL (ref 0.350–4.500)

## 2015-12-27 LAB — BRAIN NATRIURETIC PEPTIDE: B NATRIURETIC PEPTIDE 5: 65 pg/mL (ref 0.0–100.0)

## 2015-12-27 MED ORDER — ACETAMINOPHEN 325 MG PO TABS
650.0000 mg | ORAL_TABLET | Freq: Four times a day (QID) | ORAL | Status: DC | PRN
  Administered 2015-12-27 – 2015-12-29 (×2): 650 mg via ORAL
  Filled 2015-12-27 (×2): qty 2

## 2015-12-27 MED ORDER — CLOPIDOGREL BISULFATE 75 MG PO TABS
75.0000 mg | ORAL_TABLET | Freq: Every day | ORAL | Status: DC
  Administered 2015-12-27 – 2015-12-30 (×4): 75 mg via ORAL
  Filled 2015-12-27 (×4): qty 1

## 2015-12-27 MED ORDER — MIRTAZAPINE 15 MG PO TABS
15.0000 mg | ORAL_TABLET | Freq: Every evening | ORAL | Status: DC | PRN
  Administered 2015-12-27: 15 mg via ORAL
  Filled 2015-12-27: qty 1

## 2015-12-27 MED ORDER — METHYLPREDNISOLONE SODIUM SUCC 125 MG IJ SOLR
60.0000 mg | Freq: Two times a day (BID) | INTRAMUSCULAR | Status: DC
  Administered 2015-12-27 – 2015-12-29 (×5): 60 mg via INTRAVENOUS
  Filled 2015-12-27 (×6): qty 2

## 2015-12-27 MED ORDER — METOPROLOL TARTRATE 50 MG PO TABS
50.0000 mg | ORAL_TABLET | Freq: Two times a day (BID) | ORAL | Status: DC
  Administered 2015-12-27 – 2015-12-30 (×6): 50 mg via ORAL
  Filled 2015-12-27 (×6): qty 1

## 2015-12-27 MED ORDER — ALBUTEROL SULFATE (2.5 MG/3ML) 0.083% IN NEBU
5.0000 mg | INHALATION_SOLUTION | Freq: Once | RESPIRATORY_TRACT | Status: AC
  Administered 2015-12-27: 5 mg via RESPIRATORY_TRACT
  Filled 2015-12-27: qty 6

## 2015-12-27 MED ORDER — METHYLPREDNISOLONE SODIUM SUCC 125 MG IJ SOLR
125.0000 mg | Freq: Once | INTRAMUSCULAR | Status: AC
  Administered 2015-12-27: 125 mg via INTRAMUSCULAR

## 2015-12-27 MED ORDER — ORAL CARE MOUTH RINSE
15.0000 mL | Freq: Two times a day (BID) | OROMUCOSAL | Status: DC
  Administered 2015-12-27 – 2015-12-29 (×5): 15 mL via OROMUCOSAL

## 2015-12-27 MED ORDER — TIOTROPIUM BROMIDE MONOHYDRATE 18 MCG IN CAPS
18.0000 ug | ORAL_CAPSULE | Freq: Every day | RESPIRATORY_TRACT | Status: DC
Start: 2015-12-28 — End: 2015-12-30
  Administered 2015-12-28 – 2015-12-30 (×3): 18 ug via RESPIRATORY_TRACT
  Filled 2015-12-27: qty 5

## 2015-12-27 MED ORDER — IPRATROPIUM-ALBUTEROL 0.5-2.5 (3) MG/3ML IN SOLN
3.0000 mL | Freq: Once | RESPIRATORY_TRACT | Status: AC
Start: 2015-12-27 — End: 2015-12-27
  Administered 2015-12-27: 3 mL via RESPIRATORY_TRACT

## 2015-12-27 MED ORDER — CEFTRIAXONE SODIUM-DEXTROSE 1-3.74 GM-% IV SOLR
1.0000 g | INTRAVENOUS | Status: DC
  Administered 2015-12-28 – 2015-12-29 (×2): 1 g via INTRAVENOUS
  Filled 2015-12-27 (×3): qty 50

## 2015-12-27 MED ORDER — DEXTROSE 5 % IV SOLN: 1.0000 g | INTRAVENOUS | Status: DC

## 2015-12-27 MED ORDER — ENALAPRIL MALEATE 10 MG PO TABS
10.0000 mg | ORAL_TABLET | Freq: Every day | ORAL | Status: DC
  Administered 2015-12-28 – 2015-12-30 (×3): 10 mg via ORAL
  Filled 2015-12-27 (×3): qty 1

## 2015-12-27 MED ORDER — DEXTROSE 5 % IV SOLN
500.0000 mg | INTRAVENOUS | Status: DC
  Administered 2015-12-28 – 2015-12-29 (×2): 500 mg via INTRAVENOUS
  Filled 2015-12-27 (×3): qty 500

## 2015-12-27 MED ORDER — CEFTRIAXONE SODIUM-DEXTROSE 2-2.22 GM-% IV SOLR
2.0000 g | Freq: Once | INTRAVENOUS | Status: AC
  Administered 2015-12-27: 2 g via INTRAVENOUS
  Filled 2015-12-27 (×2): qty 50

## 2015-12-27 MED ORDER — METFORMIN HCL 500 MG PO TABS
500.0000 mg | ORAL_TABLET | Freq: Every day | ORAL | Status: DC
  Administered 2015-12-28 – 2015-12-30 (×3): 500 mg via ORAL
  Filled 2015-12-27 (×3): qty 1

## 2015-12-27 MED ORDER — SODIUM CHLORIDE 0.9% FLUSH
3.0000 mL | Freq: Two times a day (BID) | INTRAVENOUS | Status: DC
  Administered 2015-12-27 – 2015-12-29 (×5): 3 mL via INTRAVENOUS

## 2015-12-27 MED ORDER — FUROSEMIDE 10 MG/ML IJ SOLN
INTRAMUSCULAR | Status: AC
  Administered 2015-12-27: 20 mg via INTRAVENOUS
  Filled 2015-12-27: qty 4

## 2015-12-27 MED ORDER — ASPIRIN EC 81 MG PO TBEC
81.0000 mg | DELAYED_RELEASE_TABLET | Freq: Every day | ORAL | Status: DC
  Administered 2015-12-28 – 2015-12-30 (×3): 81 mg via ORAL
  Filled 2015-12-27 (×3): qty 1

## 2015-12-27 MED ORDER — SODIUM CHLORIDE 0.9% FLUSH
3.0000 mL | Freq: Two times a day (BID) | INTRAVENOUS | Status: DC
  Administered 2015-12-27 – 2015-12-29 (×6): 3 mL via INTRAVENOUS

## 2015-12-27 MED ORDER — NICOTINE POLACRILEX 2 MG MT GUM
2.0000 mg | CHEWING_GUM | OROMUCOSAL | Status: DC | PRN
  Filled 2015-12-27: qty 1

## 2015-12-27 MED ORDER — NICOTINE 21 MG/24HR TD PT24
21.0000 mg | MEDICATED_PATCH | Freq: Every day | TRANSDERMAL | Status: DC
  Administered 2015-12-27 – 2015-12-30 (×4): 21 mg via TRANSDERMAL
  Filled 2015-12-27 (×4): qty 1

## 2015-12-27 MED ORDER — DEXTROSE 5 % IV SOLN: 2.0000 g | Freq: Once | INTRAVENOUS | Status: DC

## 2015-12-27 MED ORDER — FUROSEMIDE 20 MG PO TABS
20.0000 mg | ORAL_TABLET | Freq: Two times a day (BID) | ORAL | Status: DC
  Administered 2015-12-27 – 2015-12-30 (×6): 20 mg via ORAL
  Filled 2015-12-27 (×7): qty 1

## 2015-12-27 MED ORDER — ENOXAPARIN SODIUM 40 MG/0.4ML ~~LOC~~ SOLN
40.0000 mg | SUBCUTANEOUS | Status: DC
  Administered 2015-12-27 – 2015-12-29 (×3): 40 mg via SUBCUTANEOUS
  Filled 2015-12-27 (×3): qty 0.4

## 2015-12-27 MED ORDER — INSULIN ASPART 100 UNIT/ML ~~LOC~~ SOLN
0.0000 [IU] | Freq: Every day | SUBCUTANEOUS | Status: DC
  Administered 2015-12-27: 2 [IU] via SUBCUTANEOUS
  Administered 2015-12-28: 3 [IU] via SUBCUTANEOUS
  Administered 2015-12-29: 2 [IU] via SUBCUTANEOUS
  Filled 2015-12-27: qty 3
  Filled 2015-12-27: qty 2

## 2015-12-27 MED ORDER — IPRATROPIUM-ALBUTEROL 0.5-2.5 (3) MG/3ML IN SOLN
3.0000 mL | Freq: Once | RESPIRATORY_TRACT | Status: AC
  Administered 2015-12-27: 3 mL via RESPIRATORY_TRACT
  Filled 2015-12-27: qty 3

## 2015-12-27 MED ORDER — INSULIN ASPART 100 UNIT/ML ~~LOC~~ SOLN
4.0000 [IU] | Freq: Three times a day (TID) | SUBCUTANEOUS | Status: DC
  Administered 2015-12-27 – 2015-12-29 (×6): 4 [IU] via SUBCUTANEOUS
  Filled 2015-12-27 (×5): qty 4

## 2015-12-27 MED ORDER — INSULIN ASPART 100 UNIT/ML ~~LOC~~ SOLN
0.0000 [IU] | Freq: Three times a day (TID) | SUBCUTANEOUS | Status: DC
  Administered 2015-12-27: 8 [IU] via SUBCUTANEOUS
  Administered 2015-12-28: 5 [IU] via SUBCUTANEOUS
  Administered 2015-12-28 – 2015-12-29 (×3): 3 [IU] via SUBCUTANEOUS
  Administered 2015-12-29: 11 [IU] via SUBCUTANEOUS
  Administered 2015-12-29: 8 [IU] via SUBCUTANEOUS
  Administered 2015-12-30: 3 [IU] via SUBCUTANEOUS
  Filled 2015-12-27 (×3): qty 3
  Filled 2015-12-27: qty 12
  Filled 2015-12-27: qty 11
  Filled 2015-12-27: qty 2
  Filled 2015-12-27: qty 3
  Filled 2015-12-27: qty 8
  Filled 2015-12-27: qty 5

## 2015-12-27 MED ORDER — SODIUM CHLORIDE 0.9% FLUSH: 3.0000 mL | INTRAVENOUS | Status: DC | PRN

## 2015-12-27 MED ORDER — ACETAMINOPHEN 650 MG RE SUPP: 650.0000 mg | Freq: Four times a day (QID) | RECTAL | Status: DC | PRN

## 2015-12-27 MED ORDER — FUROSEMIDE 40 MG PO TABS: 20.0000 mg | ORAL_TABLET | Freq: Every day | ORAL | Status: DC

## 2015-12-27 MED ORDER — BUDESONIDE 0.25 MG/2ML IN SUSP
0.2500 mg | Freq: Two times a day (BID) | RESPIRATORY_TRACT | Status: DC
  Administered 2015-12-27 – 2015-12-28 (×2): 0.25 mg via RESPIRATORY_TRACT
  Filled 2015-12-27 (×3): qty 2

## 2015-12-27 MED ORDER — SODIUM CHLORIDE 0.9 % IV SOLN: 250.0000 mL | INTRAVENOUS | Status: DC | PRN

## 2015-12-27 MED ORDER — SIMVASTATIN 20 MG PO TABS
20.0000 mg | ORAL_TABLET | Freq: Every day | ORAL | Status: DC
  Administered 2015-12-27 – 2015-12-29 (×3): 20 mg via ORAL
  Filled 2015-12-27 (×3): qty 1

## 2015-12-27 MED ORDER — MAGNESIUM SULFATE 2 GM/50ML IV SOLN
2.0000 g | Freq: Once | INTRAVENOUS | Status: AC
  Administered 2015-12-27: 2 g via INTRAVENOUS
  Filled 2015-12-27: qty 50

## 2015-12-27 MED ORDER — ALBUTEROL SULFATE (2.5 MG/3ML) 0.083% IN NEBU
2.5000 mg | INHALATION_SOLUTION | RESPIRATORY_TRACT | Status: DC
  Administered 2015-12-27 – 2015-12-28 (×5): 2.5 mg via RESPIRATORY_TRACT
  Filled 2015-12-27 (×5): qty 3

## 2015-12-27 MED ORDER — FUROSEMIDE 10 MG/ML IJ SOLN
20.0000 mg | Freq: Once | INTRAMUSCULAR | Status: AC
  Administered 2015-12-27 (×2): 20 mg via INTRAVENOUS

## 2015-12-27 MED ORDER — DEXTROSE 5 % IV SOLN
500.0000 mg | Freq: Once | INTRAVENOUS | Status: AC
  Administered 2015-12-27: 500 mg via INTRAVENOUS
  Filled 2015-12-27: qty 500

## 2015-12-27 MED ORDER — SALINE SPRAY 0.65 % NA SOLN
1.0000 | NASAL | Status: DC | PRN
  Administered 2015-12-27 – 2015-12-29 (×2): 1 via NASAL
  Filled 2015-12-27: qty 44

## 2015-12-27 NOTE — ED Triage Notes (Signed)
Pt arrived via EMS from [facility] for increased SOB and productive cough. Pt received one duoneb and solumedrol 125 mg IM at [facility], EMS administered one duoneb as well. EMS reports VSS, 94% RA, 100% 2L. Pt with labored breathing.

## 2015-12-27 NOTE — ED Provider Notes (Signed)
[facility] ____________________________________________  Time seen: Approximately 12:16 PM  I have reviewed the triage vital signs and the nursing notes.   HISTORY  Chief Complaint Shortness of Breath   HPI Bradley [name] is a 65 y.o. male patient with a history of COPD, CAD, hypertension and chronic shortness of breath presenting for the complaints of shortness of breath. Patient reports symptoms started this past Thursday and have since gradually worsened. Patient reports he was seen in the emergency room this past Friday diagnosis COPD exacerbation and started on oral prednisone. Patient reports he then had a follow-up on Monday with his pulmonologist and started on a Z-Pak. Patient reports that symptoms have continuously worsened.  Patient states cough and congestion, reporting that cough is productive of thick greenish mucus. Reports shortness of breath is constant, intermittently increase with exertion. Patient reports he does have supplemental oxygen at home but has been using it all day for the last 4 days. Patient reports yesterday when getting up to go to the restroom he taken his oxygen off briefly and checked his O2 sat which was reading 82%. Patient reports he has been taking his medications as prescribed including antibiotic, prednisone and using home albuterol neb treatments. Patient reports last neb treatment was at 9 AM this morning.  Denies chest pain, reports chest tightness. Patient reports he also had onset of lower abdominal pain starting this morning he describes as mild at this time. Denies nausea or vomiting diarrhea or fevers.  Bradley Castle, MD: PCP   Past Medical History:  Diagnosis Date  . COPD (chronic obstructive pulmonary disease) ([place])   . Coronary artery disease   . Diabetes mellitus without complication ([place])   . Hypertension     Patient Active Problem List   Diagnosis Date Noted  . Acute respiratory distress 09/19/2015  .  Adjustment disorder with mixed anxiety and depressed mood 04/14/2015  . Insomnia 04/14/2015  . Grief 04/14/2015  . Hyponatremia 04/12/2015  . COPD exacerbation ([place]) 10/08/2014  . CAP (community acquired pneumonia) 10/08/2014  . Hypogammaglobulinemia ([place]) 08/17/2014  . Leukocytosis 08/03/2014  . Recurrent infections 08/03/2014    Past Surgical History:  Procedure Laterality Date  . LUNG BIOPSY Right    2006    Current Outpatient Rx  . Order #: [id number] Class: Historical Med  . Order #: [id number] Class: Historical Med  . Order #: [id number] Class: Historical Med  . Order #: [id number] Class: Historical Med  . Order #: [id number] Class: Print  . Order #: [id number] Class: Historical Med  . Order #: [id number] Class: Historical Med  . Order #: [id number] Class: Normal  . Order #: [id number] Class: Print  . Order #: [id number] Class: Historical Med  . Order #: [id number] Class: Print  . Order #: [id number] Class: Historical Med    No current facility-administered medications for this encounter.   Current Outpatient Prescriptions:  .  albuterol (PROVENTIL) (2.5 MG/3ML) 0.083% nebulizer solution, Take 2.5 mg by nebulization every 6 (six) hours as needed for wheezing or shortness of breath., Disp: , Rfl:  .  aspirin EC 81 MG tablet, Take 81 mg by mouth daily., Disp: , Rfl:  .  clopidogrel (PLAVIX) 75 MG tablet, Take 75 mg by mouth daily., Disp: , Rfl:  .  enalapril (VASOTEC) 10 MG tablet, Take 10 mg by mouth daily., Disp: , Rfl:  .  Fluticasone-Salmeterol (ADVAIR) 250-50 MCG/DOSE AEPB, Inhale 1 puff into the lungs 2 (two) times daily., Disp: 60 each, Rfl: 0 .  metFORMIN (GLUCOPHAGE) 500 MG tablet, Take 500 mg by  mouth daily. Takes every morning and then takes another one in the evening if his "sugar starts acting up", Disp: , Rfl:  .  metoprolol (LOPRESSOR) 50 MG tablet, Take 50 mg by mouth 2 (two) times daily., Disp: , Rfl:  .  mirtazapine (REMERON) 15 MG tablet, Take 1 tablet (15 mg total) by mouth  at bedtime. (Patient taking differently: Take 15 mg by mouth at bedtime as needed (for sleep). ), Disp: 30 tablet, Rfl: 0 .  predniSONE (DELTASONE) 20 MG tablet, Take 3 tablets (60 mg total) by mouth daily., Disp: 15 tablet, Rfl: 0 .  simvastatin (ZOCOR) 20 MG tablet, Take 20 mg by mouth at bedtime. , Disp: , Rfl:  .  tiotropium (SPIRIVA) 18 MCG inhalation capsule, Place 1 capsule (18 mcg total) into inhaler and inhale daily., Disp: 30 capsule, Rfl: 0 .  nitroGLYCERIN (NITROSTAT) 0.4 MG SL tablet, Place 0.4 mg under the tongue every 5 (five) minutes as needed for chest pain., Disp: , Rfl:   Allergies Review of patient's allergies indicates no known allergies.  Family History  Problem Relation Age of Onset  . Cancer Mother   . Heart attack Father   . Cancer Sister     Social History Social History  Substance Use Topics  . Smoking status: Current Every Day Smoker    Packs/day: 0.50  . Smokeless tobacco: Never Used  . Alcohol use No    Review of Systems Constitutional: No fever/chills Eyes: No visual changes. ENT: No sore throat. Cardiovascular: Denies chest pain. Respiratory: As above.  Gastrointestinal: No nausea, no vomiting.  No diarrhea.  No constipation. Genitourinary: Negative for dysuria. Musculoskeletal: Negative for back pain. Skin: Negative for rash. Neurological: Negative for headaches, focal weakness or numbness.  10-point ROS otherwise negative.  ____________________________________________   PHYSICAL EXAM:  VITAL SIGNS: ED Triage Vitals  Enc Vitals Group     BP 12/27/15 1211 130/66     Pulse Rate 12/27/15 1211 97     Resp 12/27/15 1211 28     Temp 12/27/15 1211 98 F (36.7 C)     Temp Source 12/27/15 1211 Tympanic     SpO2 12/27/15 1211 96 %     Weight --      Height --      Head Circumference --      Peak Flow --      Pain Score 12/27/15 1212 8     Pain Loc --      Pain Edu? --      Excl. in [place]? --     Constitutional: Alert and oriented.  Well appearing and in no acute distress. Eyes: Conjunctivae are normal. PERRL. EOMI. ENT      Head: Normocephalic and atraumatic.      Nose: No congestion/rhinnorhea.      Mouth/Throat: Mucous membranes are moist.Oropharynx non-erythematous. Neck: No stridor. Supple without meningismus.  Hematological/Lymphatic/Immunilogical: No cervical lymphadenopathy. Cardiovascular: Normal rate, regular rhythm. Grossly normal heart sounds.  Good peripheral circulation. Respiratory: Patient speaking in 2-3 word sentences. Tachypneic. Diffuse expiratory wheezes. No focal area of consolidation auscultated. Intermittent cough noted.  Gastrointestinal: Mild generalized lower abdomen tenderness. Normal Bowel sounds. No CVA tenderness. Musculoskeletal:  No lower extremity edema or tenderness noted Neurologic:  Normal speech and language. No gross focal neurologic deficits are appreciated. Speech is normal.  Skin:  Skin is warm, dry and intact. No rash noted. Psychiatric: Mood and affect are normal. Speech and behavior are normal. Patient exhibits appropriate insight and judgment  ___________________________________________   LABS (all labs ordered are listed, but only abnormal results are displayed)  Labs Reviewed - No data to display  ECG ED ECG REPORT I, Marylene Land, the attending provider, personally viewed and interpreted this ECG.   Date: 12/27/2015  EKG Time: 1209  Rate: 98  Rhythm: normal sinus rhythm  Axis: normal  Intervals:right bundle branch block  ST&T Change: none   PROCEDURES Procedures    INITIAL IMPRESSION / ASSESSMENT AND PLAN / ED COURSE  Pertinent labs & imaging results that were available during my care of the patient were reviewed by me and considered in my medical decision making (see chart for details).  Presented with complaints of shortness of breath. Patient seen twice in the last week for the same complaints, and reports worsened. Patient with diffuse  expiratory wheezes, no focal area of consolidation auscultated. Discussed in detail with patient suspect COPD exacerbation which is felt outpatient management. Recommend further evaluation and treatment emergency room and possible admission. Recommend EMS transfer. Patient agrees this plan. 125 g IM Solu-Medrol given once, DuoNeb administered once.  EMS at bedside. Patient stable at time of transfer. Marya Amsler RN called at [facility] and given report.  Discussed follow up with Primary care physician this week. Discussed follow up and return parameters including no resolution or any worsening concerns. Patient verbalized understanding and agreed to plan.   ____________________________________________   FINAL CLINICAL IMPRESSION(S) / ED DIAGNOSES  Final diagnoses:  COPD exacerbation ([place])  SOB (shortness of breath)  Lower abdominal pain     New Prescriptions   No medications on file    Note: This dictation was prepared with Dragon dictation along with smaller phrase technology. Any transcriptional errors that result from this process are unintentional.    Clinical Course          Marylene Land, NP 12/27/15 1300

## 2015-12-27 NOTE — ED Notes (Signed)
Radiology at bedside

## 2015-12-27 NOTE — H&P (Signed)
[facility] NAME: Bradley [name]    MR#:  [id number]  DATE OF BIRTH:  1950-05-14  DATE OF ADMISSION:  12/27/2015  PRIMARY CARE PHYSICIAN: Valera Castle, MD   REQUESTING/REFERRING PHYSICIAN:   CHIEF COMPLAINT:   Chief Complaint  Patient presents with  . Shortness of Breath    HISTORY OF PRESENT ILLNESS: Nycholas Rayner  is a 65 y.o. male with a known history of COPD, ongoing tobacco abuse, coronary artery disease, diabetes mellitus, hypertension, who presents to the hospital with complaints of 4-5 day history of shortness of breath, low-grade fevers to 66F, cough with green phlegm, wheezing, swollen feet, worse than before. Due to the discomfort. He presented to emergency room for further evaluation and treatment. The chest x-ray revealed a right midlung and left lower lobe patchy opacities, concerning for pneumonia. Hospitalist services were contacted for admission. Patient was tachypneic, tachycardic, hypoxic. In emergency room, requiring BiPAP administration. His labs revealed leukocytosis with white blood cell count of 17.6.   PAST MEDICAL HISTORY:   Past Medical History:  Diagnosis Date  . COPD (chronic obstructive pulmonary disease) ([place])   . Coronary artery disease   . Diabetes mellitus without complication ([place])   . Hypertension     PAST SURGICAL HISTORY: Past Surgical History:  Procedure Laterality Date  . LUNG BIOPSY Right    2006    SOCIAL HISTORY:  Social History  Substance Use Topics  . Smoking status: Current Every Day Smoker    Packs/day: 0.50  . Smokeless tobacco: Never Used  . Alcohol use No    FAMILY HISTORY:  Family History  Problem Relation Age of Onset  . Cancer Mother   . Heart attack Father   . Cancer Sister     DRUG ALLERGIES: No Known Allergies  Review of Systems  Unable to perform ROS: Medical condition  Constitutional: Positive for fever and malaise/fatigue.  Respiratory:  Positive for cough, sputum production, shortness of breath and wheezing.   Gastrointestinal: Positive for abdominal pain.    MEDICATIONS AT HOME:  Prior to Admission medications   Medication Sig Start Date End Date Taking? Authorizing Provider  albuterol (PROVENTIL) (2.5 MG/3ML) 0.083% nebulizer solution Take 2.5 mg by nebulization every 6 (six) hours as needed for wheezing or shortness of breath.    Historical Provider, MD  aspirin EC 81 MG tablet Take 81 mg by mouth daily.    Historical Provider, MD  clopidogrel (PLAVIX) 75 MG tablet Take 75 mg by mouth daily.    Historical Provider, MD  enalapril (VASOTEC) 10 MG tablet Take 10 mg by mouth daily.    Historical Provider, MD  Fluticasone-Salmeterol (ADVAIR) 250-50 MCG/DOSE AEPB Inhale 1 puff into the lungs 2 (two) times daily. 09/20/15   Srikar Sudini, MD  metFORMIN (GLUCOPHAGE) 500 MG tablet Take 500 mg by mouth daily. Takes every morning and then takes another one in the evening if his "sugar starts acting up"    Historical Provider, MD  metoprolol (LOPRESSOR) 50 MG tablet Take 50 mg by mouth 2 (two) times daily.    Historical Provider, MD  mirtazapine (REMERON) 15 MG tablet Take 1 tablet (15 mg total) by mouth at bedtime. Patient taking differently: Take 15 mg by mouth at bedtime as needed (for sleep).  04/16/15   Dustin Flock, MD  nitroGLYCERIN (NITROSTAT) 0.4 MG SL tablet Place 0.4 mg under the tongue every 5 (five) minutes as needed for chest pain.  Historical Provider, MD  predniSONE (DELTASONE) 20 MG tablet Take 3 tablets (60 mg total) by mouth daily. 12/22/15   Hinda Kehr, MD  simvastatin (ZOCOR) 20 MG tablet Take 20 mg by mouth at bedtime.     Historical Provider, MD  tiotropium (SPIRIVA) 18 MCG inhalation capsule Place 1 capsule (18 mcg total) into inhaler and inhale daily. 09/20/15   Srikar Sudini, MD      PHYSICAL EXAMINATION:   VITAL SIGNS: Blood pressure 135/69, pulse 95, temperature 98 F (36.7 C), temperature source  Oral, resp. rate 16, height '5\' 7"'$  (1.702 m), weight 65.8 kg (145 lb), SpO2 96 %.  GENERAL:  65 y.o.-year-old patient lying in the bed  In moderate respiratory distress, BiPAP on the face. Tachypneic, somewhat uncomfortable, dyspneic EYES: Pupils equal, round, reactive to light and accommodation. No scleral icterus. Extraocular muscles intact.  HEENT: Head atraumatic, normocephalic. Oropharynx and nasopharynx clear.  NECK:  Supple, no jugular venous distention. No thyroid enlargement, no tenderness.  LUNGS: Diminished breath sounds bilaterally, no wheezing, bilateral, more on the right side rales,rhonchi , crepitations noted on auscultation. Using accessory muscles of respiration, especially with movements and speech.  CARDIOVASCULAR: S1, S2 normal. No murmurs, rubs, or gallops.  ABDOMEN: Soft, tender in right upper quadrant, epigastric area, suprapubic area, but no rebound, however. Mild voluntary guarding was noted, nondistended. Bowel sounds present. No organomegaly or mass.  EXTREMITIES: 1+ lower extremity and pedal edema bilaterally, no cyanosis, or clubbing.  NEUROLOGIC: Cranial nerves II through XII are intact. Muscle strength 5/5 in all extremities. Sensation intact. Gait not checked.  PSYCHIATRIC: The patient is alert and oriented x 3.  SKIN: No obvious rash, lesion, or ulcer.   LABORATORY PANEL:   CBC  Recent Labs Lab 12/22/15 1813 12/27/15 1318  WBC 18.2* 17.6*  HGB 12.9* 12.7*  HCT 39.3* 39.5*  PLT 382 423  MCV 80.1 80.1  MCH 26.3 25.8*  MCHC 32.8 32.1  RDW 16.6* 16.1*  LYMPHSABS  --  1.0  MONOABS  --  1.1*  EOSABS  --  0.0  BASOSABS  --  0.1   ------------------------------------------------------------------------------------------------------------------  Chemistries   Recent Labs Lab 12/27/15 1318  NA 130*  K 4.2  CL 95*  CO2 25  GLUCOSE 191*  BUN 14  CREATININE 0.55*  CALCIUM 9.1    ------------------------------------------------------------------------------------------------------------------  Cardiac Enzymes  Recent Labs Lab 12/22/15 1822 12/27/15 1318  TROPONINI <0.03 <0.03   ------------------------------------------------------------------------------------------------------------------  RADIOLOGY: Dg Chest 1 View  Result Date: 12/27/2015 CLINICAL DATA:  Initial evaluation for productive cough, shortness of breath. EXAM: CHEST 1 VIEW COMPARISON:  Prior radiograph from 12/22/2015. FINDINGS: Cardiac and mediastinal silhouettes are stable in size and contour, and remain within normal limits. Atheromatous plaque present within the aortic arch. Lungs are normally inflated. Scattered emphysematous changes with perihilar and right apical parenchymal scarring again noted. Blunting of the right costophrenic angle is similar to prior, likely related to chronic pleural reaction/scarring. Surgical clips overlie the right hilum with adjacent suture material. There are increased patchy densities within knee right mid lung and retrocardiac left lower lobe, suspicious for possible infectious infiltrates given the provided history. No pulmonary edema. No definite pleural effusion. No pneumothorax. Visualized osseous structures are unchanged. IMPRESSION: 1. New patchy opacities within the right mid lung and retrocardiac left lower lobe, suspicious for possible infection/pneumonia given the provided history. 2. Otherwise stable appearance of the lungs with underlying emphysema and chronic parenchymal scarring as above. Electronically Signed   By: Pincus Badder.D.  On: 12/27/2015 14:08    EKG: Orders placed or performed during the hospital encounter of 12/27/15  . EKG 12-Lead  . EKG 12-Lead   EKG in the emergency room reveals sinus rhythm at 99 bpm, right bundle branch block, probable inferior infarct, old, no acute ST-T changes  IMPRESSION AND PLAN:  Active  Problems:   COPD exacerbation (HCC)   Leukocytosis   Acute respiratory failure with hypoxia ([place])   Community acquired pneumonia  #1. Acute respiratory failure with hypoxia due to pneumonia and questionable fluid overload, admit patient to medical floor, continue BiPAP, wean off to oxygen via nasal cannulas as tolerated, continue steroids, inhalation therapy, nebulizers, antibiotics, give her 1 dose of Lasix intravenously, continue Lasix orally, following kidney function and potassium #2. COPD exacerbation, initiate budesonide, Solu-Medrol, duo nebs, follow clinically #3. Multifocal pneumonia, get sputum cultures, continue Rocephin and Zithromax, adjust antibiotics depending on culture results #4. Leukocytosis, follow with antibiotic therapy #5. Fluid overload, questionable right-sided heart failure due to COPD exacerbation, continue Lasix orally. Following kidney function and potassium, get BNP #6. Hyponatremia, follow with Lasix #7. Diabetes mellitus, continue diabetic diet, outpatient medications, sliding scale insulin while in the hospital #8. Tobacco abuse counseling, discussed this patient for 3-4 minutes, nicotine replacement therapy will be initiated, patient is agreeable, claims cutting down on smoking.   All the records are reviewed and case discussed with ED provider. Management plans discussed with the patient, family and they are in agreement.  CODE STATUS: Code Status History    Date Active Date Inactive Code Status Order ID Comments User Context   09/19/2015  6:37 PM 09/20/2015  5:10 PM Full Code [id number]  Theodoro Grist, MD Inpatient   04/12/2015 10:23 AM 04/16/2015  8:48 PM Full Code [id number]  Vaughan Basta, MD Inpatient   10/08/2014  3:48 PM 10/11/2014  2:26 PM Full Code [id number]  Idelle Crouch, MD Inpatient    Advance Directive Documentation   Flowsheet Row Most Recent Value  Type of Advance Directive  Healthcare Power of Attorney, Living will  Pre-existing out of  facility DNR order (yellow form or pink MOST form)  No data  "MOST" Form in Place?  No data       TOTAL Critical care TIME TAKING CARE OF THIS PATIENT: 60 minutes.    Theodoro Grist M.D on 12/27/2015 at 2:32 PM  Between 7am to 6pm - Pager - [phone] After 6pm go to [email or web address] - password [email or web address] [facility]  Office  [phone]  CC: Primary care physician; Valera Castle, MD

## 2015-12-27 NOTE — ED Triage Notes (Signed)
Pt is short winded and has been seen in the ED and PCP the last week for the same complaint. He is short of breath and currently on 2 L via [place]. He does have COPD and does still smoke. His current O2 is 96%

## 2015-12-27 NOTE — ED Provider Notes (Signed)
[facility] Emergency Department Provider Note  ____________________________________________   First MD Initiated Contact with Patient 12/27/15 1318     (approximate)  I have reviewed the triage vital signs and the nursing notes.   HISTORY  Chief Complaint Shortness of Breath   HPI Bradley [name] is a 65 y.o. male With a history of COPD as well as coronary artery disease was presenting to the emergency department today with shortness of breath. He was seen in this emergency department this past Friday and discharged with steroids as well as azithromycin. He says that he has had persistent shortness of breath since then and now a productive cough with green sputum. He denies a fever. Does not report any pain. Was seen earlier today at urgent care where he was given a DuoNeb as well as 125 mg of IM methylprednisolone. He was also given a DuoNeb en route for a total of 2 DuoNeb nebs prior to arrival. The patient says that he uses oxygen as needed at home. Was 94% on room airfor medics in route.   Past Medical History:  Diagnosis Date  . COPD (chronic obstructive pulmonary disease) ([place])   . Coronary artery disease   . Diabetes mellitus without complication ([place])   . Hypertension     Patient Active Problem List   Diagnosis Date Noted  . Acute respiratory distress 09/19/2015  . Adjustment disorder with mixed anxiety and depressed mood 04/14/2015  . Insomnia 04/14/2015  . Grief 04/14/2015  . Hyponatremia 04/12/2015  . COPD exacerbation ([place]) 10/08/2014  . CAP (community acquired pneumonia) 10/08/2014  . Hypogammaglobulinemia ([place]) 08/17/2014  . Leukocytosis 08/03/2014  . Recurrent infections 08/03/2014    Past Surgical History:  Procedure Laterality Date  . LUNG BIOPSY Right    2006    Prior to Admission medications   Medication Sig Start Date End Date Taking? Authorizing Provider  albuterol (PROVENTIL) (2.5 MG/3ML) 0.083% nebulizer solution Take  2.5 mg by nebulization every 6 (six) hours as needed for wheezing or shortness of breath.    Historical Provider, MD  aspirin EC 81 MG tablet Take 81 mg by mouth daily.    Historical Provider, MD  clopidogrel (PLAVIX) 75 MG tablet Take 75 mg by mouth daily.    Historical Provider, MD  enalapril (VASOTEC) 10 MG tablet Take 10 mg by mouth daily.    Historical Provider, MD  Fluticasone-Salmeterol (ADVAIR) 250-50 MCG/DOSE AEPB Inhale 1 puff into the lungs 2 (two) times daily. 09/20/15   Srikar Sudini, MD  metFORMIN (GLUCOPHAGE) 500 MG tablet Take 500 mg by mouth daily. Takes every morning and then takes another one in the evening if his "sugar starts acting up"    Historical Provider, MD  metoprolol (LOPRESSOR) 50 MG tablet Take 50 mg by mouth 2 (two) times daily.    Historical Provider, MD  mirtazapine (REMERON) 15 MG tablet Take 1 tablet (15 mg total) by mouth at bedtime. Patient taking differently: Take 15 mg by mouth at bedtime as needed (for sleep).  04/16/15   Dustin Flock, MD  nitroGLYCERIN (NITROSTAT) 0.4 MG SL tablet Place 0.4 mg under the tongue every 5 (five) minutes as needed for chest pain.    Historical Provider, MD  predniSONE (DELTASONE) 20 MG tablet Take 3 tablets (60 mg total) by mouth daily. 12/22/15   Hinda Kehr, MD  simvastatin (ZOCOR) 20 MG tablet Take 20 mg by mouth at bedtime.     Historical Provider, MD  tiotropium (SPIRIVA) 18 MCG  inhalation capsule Place 1 capsule (18 mcg total) into inhaler and inhale daily. 09/20/15   Hillary Bow, MD    Allergies Review of patient's allergies indicates no known allergies.  Family History  Problem Relation Age of Onset  . Cancer Mother   . Heart attack Father   . Cancer Sister     Social History Social History  Substance Use Topics  . Smoking status: Current Every Day Smoker    Packs/day: 0.50  . Smokeless tobacco: Never Used  . Alcohol use No    Review of Systems Constitutional: No fever/chills Eyes: No visual  changes. ENT: No sore throat. Cardiovascular: Denies chest pain. Respiratory: as above Gastrointestinal: No abdominal pain.  No nausea, no vomiting.  No diarrhea.  No constipation. Genitourinary: Negative for dysuria. Musculoskeletal: Negative for back pain. Skin: Negative for rash. Neurological: Negative for headaches, focal weakness or numbness.  10-point ROS otherwise negative.  ____________________________________________   PHYSICAL EXAM:  VITAL SIGNS: ED Triage Vitals  Enc Vitals Group     BP --      Pulse --      Resp --      Temp --      Temp src --      SpO2 --      Weight 12/27/15 1317 145 lb (65.8 kg)     Height 12/27/15 1317 '5\' 7"'$  (1.702 m)     Head Circumference --      Peak Flow --      Pain Score 12/27/15 1318 6     Pain Loc --      Pain Edu? --      Excl. in [place]? --     Constitutional: Alert and oriented. Speaking in 2-3  sentences Eyes: Conjunctivae are normal. PERRL. EOMI. Head: Atraumatic. Nose: No congestion/rhinnorhea. Mouth/Throat: Mucous membranes are moist.   Neck: No stridor.   Cardiovascular: Normal rate, regular rhythm. Grossly normal heart sounds.  Good peripheral circulation. Respiratory: labored respirations with supraclavicular retractions. Wheezing  Throughout all fields with a prolonged expiratory phase. Gastrointestinal: Soft and nontender. No distention.  Musculoskeletal: No lower extremity tenderness nor edema.  No joint effusions. Neurologic:  Normal speech and language. No gross focal neurologic deficits are appreciated. No gait instability. Skin:  Skin is warm, dry and intact. No rash noted. Psychiatric: Mood and affect are normal. Speech and behavior are normal.  ____________________________________________   LABS (all labs ordered are listed, but only abnormal results are displayed)  Labs Reviewed  CBC WITH DIFFERENTIAL/PLATELET - Abnormal; Notable for the following:       Result Value   WBC 17.6 (*)    Hemoglobin 12.7  (*)    HCT 39.5 (*)    MCH 25.8 (*)    RDW 16.1 (*)    Neutro Abs 15.3 (*)    Monocytes Absolute 1.1 (*)    All other components within normal limits  BASIC METABOLIC PANEL - Abnormal; Notable for the following:    Sodium 130 (*)    Chloride 95 (*)    Glucose, Bld 191 (*)    Creatinine, Ser 0.55 (*)    All other components within normal limits  TROPONIN I   ____________________________________________  EKG  ED ECG REPORT I, [name] Stabler, the attending physician, personally viewed and interpreted this ECG.   Date: 12/27/2015  EKG Time: 1321  Rate: 99  Rhythm: normal sinus rhythm  Axis: normal  Intervals:right bundle branch block  ST&T Change: no ST elevations or  depressions. T-wave inversions in aVL as well as V2. T-wave inversion in aVL appears new. ____________________________________________  RADIOLOGY  DG Chest 1 View (Accession [id number]) (Order [id number])  Imaging  Date: 12/27/2015 Department: [facility] EMERGENCY DEPARTMENT Released By/Authorizing: Orbie Pyo, MD (auto-released)  Exam Information   Status Exam Begun  Exam Ended   Final [99] 12/27/2015 1:40 PM 12/27/2015 1:50 PM  PACS Images   Show images for DG Chest 1 View  Study Result   CLINICAL DATA:  Initial evaluation for productive cough, shortness of breath.  EXAM: CHEST 1 VIEW  COMPARISON:  Prior radiograph from 12/22/2015.  FINDINGS: Cardiac and mediastinal silhouettes are stable in size and contour, and remain within normal limits. Atheromatous plaque present within the aortic arch.  Lungs are normally inflated. Scattered emphysematous changes with perihilar and right apical parenchymal scarring again noted. Blunting of the right costophrenic angle is similar to prior, likely related to chronic pleural reaction/scarring. Surgical clips overlie the right hilum with adjacent suture material. There are increased patchy densities within knee  right mid lung and retrocardiac left lower lobe, suspicious for possible infectious infiltrates given the provided history. No pulmonary edema. No definite pleural effusion. No pneumothorax.  Visualized osseous structures are unchanged.  IMPRESSION: 1. New patchy opacities within the right mid lung and retrocardiac left lower lobe, suspicious for possible infection/pneumonia given the provided history. 2. Otherwise stable appearance of the lungs with underlying emphysema and chronic parenchymal scarring as above.   Electronically Signed   By: Jeannine Boga M.D.   On: 12/27/2015 14:08     ____________________________________________   PROCEDURES  Procedure(s) performed:   CRITICAL CARE Performed by: [name] Stabler   Total critical care time: 35 minutes  Critical care time was exclusive of separately billable procedures and treating other patients.  Critical care was necessary to treat or prevent imminent or life-threatening deterioration.  Critical care was time spent personally by me on the following activities: development of treatment plan with patient and/or surrogate as well as nursing, discussions with consultants, evaluation of patient's response to treatment, examination of patient, obtaining history from patient or surrogate, ordering and performing treatments and interventions, ordering and review of laboratory studies, ordering and review of radiographic studies, pulse oximetry and re-evaluation of patient's condition.  Patient to be placed on BiPAP. Failing outpatient treatment. Also with multiple rounds of nebulizer treatments as well as steroids as an outpatient and still with labored breathing. I'm concerned that he could tire ended up on a ventilator. We will also be giving him magnesium.  Procedures  Critical Care performed:   ____________________________________________   INITIAL IMPRESSION / ASSESSMENT AND PLAN / ED  COURSE  Pertinent labs & imaging results that were available during my care of the patient were reviewed by me and considered in my medical decision making (see chart for details).  ----------------------------------------- 2:15 PM on 12/27/2015 -----------------------------------------  Patient doing well on the BiPAP and saying that it is helping with his breathing. He is tolerating it well. Found to have pneumonia. He will be admitted to the hospital for that of outpatient treatment. Signed out to Dr. Posey Pronto. The patient understands the plan and is willing to comply. Patient without any hospitalizations in the last 3 months.  Clinical Course     ____________________________________________   FINAL CLINICAL IMPRESSION(S) / ED DIAGNOSES  Community acquired pneumonia. COPD exacerbation.    NEW MEDICATIONS STARTED DURING THIS VISIT:  New Prescriptions   No medications on file  Note:  This document was prepared using Dragon voice recognition software and may include unintentional dictation errors.    Orbie Pyo, MD 12/27/15 [phone]

## 2015-12-27 NOTE — Therapy (Signed)
Called to E.D. For BiPAP. Patient found on room air with SpO2 90%, somewhat SOB, able to speak in short sentences. BiPAP placed on documented settings. In-line neb tx started via aerogen. RR decreased from 26 to 16, patient stated he was breathing better. SpO2 increased to 96% on FiO2 .30. Tolerating BiPAP well.

## 2015-12-28 ENCOUNTER — Other Ambulatory Visit: Payer: Self-pay | Admitting: *Deleted

## 2015-12-28 DIAGNOSIS — J189 Pneumonia, unspecified organism: Secondary | ICD-10-CM | POA: Diagnosis not present

## 2015-12-28 DIAGNOSIS — J441 Chronic obstructive pulmonary disease with (acute) exacerbation: Secondary | ICD-10-CM | POA: Diagnosis not present

## 2015-12-28 DIAGNOSIS — Z720 Tobacco use: Secondary | ICD-10-CM | POA: Diagnosis not present

## 2015-12-28 DIAGNOSIS — J9621 Acute and chronic respiratory failure with hypoxia: Secondary | ICD-10-CM | POA: Diagnosis not present

## 2015-12-28 LAB — BASIC METABOLIC PANEL
Anion gap: 8 (ref 5–15)
BUN: 15 mg/dL (ref 6–20)
CHLORIDE: 94 mmol/L — AB (ref 101–111)
CO2: 29 mmol/L (ref 22–32)
Calcium: 8.9 mg/dL (ref 8.9–10.3)
Creatinine, Ser: 0.55 mg/dL — ABNORMAL LOW (ref 0.61–1.24)
GFR calc Af Amer: >60 mL/min (ref >60)
GFR calc non Af Amer: >60 mL/min (ref >60)
GLUCOSE: 167 mg/dL — AB (ref 65–99)
POTASSIUM: 4.4 mmol/L (ref 3.5–5.1)
SODIUM: 131 mmol/L — AB (ref 135–145)

## 2015-12-28 LAB — CBC
HEMATOCRIT: 35.9 % — AB (ref 40.0–52.0)
Hemoglobin: 12.2 g/dL — ABNORMAL LOW (ref 13.0–18.0)
MCH: 26.6 pg (ref 26.0–34.0)
MCHC: 33.8 g/dL (ref 32.0–36.0)
MCV: 78.7 fL — AB (ref 80.0–100.0)
Platelets: 424 10*3/uL (ref 150–440)
RBC: 4.56 MIL/uL (ref 4.40–5.90)
RDW: 15.9 % — AB (ref 11.5–14.5)
WBC: 16.8 10*3/uL — AB (ref 3.8–10.6)

## 2015-12-28 LAB — GLUCOSE, CAPILLARY
GLUCOSE-CAPILLARY: 248 mg/dL — AB (ref 65–99)
Glucose-Capillary: 197 mg/dL — ABNORMAL HIGH (ref 65–99)
Glucose-Capillary: 177 mg/dL — ABNORMAL HIGH (ref 65–99)
Glucose-Capillary: 273 mg/dL — ABNORMAL HIGH (ref 65–99)

## 2015-12-28 LAB — TROPONIN I

## 2015-12-28 LAB — HEMOGLOBIN A1C
Hgb A1c MFr Bld: 6.8 % — ABNORMAL HIGH (ref 4.8–5.6)
Mean Plasma Glucose: 148 mg/dL

## 2015-12-28 MED ORDER — ALBUTEROL SULFATE (2.5 MG/3ML) 0.083% IN NEBU: 2.5000 mg | INHALATION_SOLUTION | RESPIRATORY_TRACT | Status: DC | PRN

## 2015-12-28 MED ORDER — BUDESONIDE 0.5 MG/2ML IN SUSP
0.5000 mg | Freq: Two times a day (BID) | RESPIRATORY_TRACT | Status: DC
  Administered 2015-12-28 – 2015-12-30 (×4): 0.5 mg via RESPIRATORY_TRACT
  Filled 2015-12-28 (×4): qty 2

## 2015-12-28 MED ORDER — ALBUTEROL SULFATE (2.5 MG/3ML) 0.083% IN NEBU
2.5000 mg | INHALATION_SOLUTION | Freq: Three times a day (TID) | RESPIRATORY_TRACT | Status: DC
  Administered 2015-12-28 – 2015-12-30 (×6): 2.5 mg via RESPIRATORY_TRACT
  Filled 2015-12-28 (×6): qty 3

## 2015-12-28 MED ORDER — GABAPENTIN 300 MG PO CAPS
300.0000 mg | ORAL_CAPSULE | Freq: Two times a day (BID) | ORAL | Status: DC
  Administered 2015-12-28 – 2015-12-30 (×5): 300 mg via ORAL
  Filled 2015-12-28 (×5): qty 1

## 2015-12-28 MED ORDER — GUAIFENESIN-DM 100-10 MG/5ML PO SYRP
10.0000 mL | ORAL_SOLUTION | Freq: Four times a day (QID) | ORAL | Status: DC | PRN
  Administered 2015-12-28 – 2015-12-29 (×2): 10 mL via ORAL
  Filled 2015-12-28 (×3): qty 10

## 2015-12-28 NOTE — Progress Notes (Signed)
Pt received from ICU assessment unchanged from this morning. Tele applied and verified.

## 2015-12-28 NOTE — Progress Notes (Signed)
[facility] at [place] NAME: Bradley [name]    MR#:  [id number]  DATE OF BIRTH:  [birth year]  SUBJECTIVE:  CHIEF COMPLAINT:  Patient is feeling much better. Off BiPAP  REVIEW OF SYSTEMS:  CONSTITUTIONAL: No fever, fatigue or weakness.  EYES: No blurred or double vision.  EARS, NOSE, AND THROAT: No tinnitus or ear pain.  RESPIRATORY: No cough, Denies shortness of breath while resting , no wheezing or hemoptysis.  CARDIOVASCULAR: No chest pain, orthopnea, edema.  GASTROINTESTINAL: No nausea, vomiting, diarrhea or abdominal pain.  GENITOURINARY: No dysuria, hematuria.  ENDOCRINE: No polyuria, nocturia,  HEMATOLOGY: No anemia, easy bruising or bleeding SKIN: No rash or lesion. MUSCULOSKELETAL: No joint pain or arthritis.   NEUROLOGIC: No tingling, numbness, weakness.  PSYCHIATRY: No anxiety or depression.   DRUG ALLERGIES:  No Known Allergies  VITALS:  Blood pressure 129/81, pulse 94, temperature 97.7 F (36.5 C), temperature source Oral, resp. rate 19, height '5\' 7"'$  (1.702 m), weight 63.1 kg (139 lb 1.8 oz), SpO2 93 %.  PHYSICAL EXAMINATION:  GENERAL:  65 y.o.-year-old patient lying in the bed with no acute distress.  EYES: Pupils equal, round, reactive to light and accommodation. No scleral icterus. Extraocular muscles intact.  HEENT: Head atraumatic, normocephalic. Oropharynx and nasopharynx clear.  NECK:  Supple, no jugular venous distention. No thyroid enlargement, no tenderness.  LUNGS: diminished breath sounds bilaterally, no wheezing, rales,rhonchi or crepitation. No use of accessory muscles of respiration.  CARDIOVASCULAR: S1, S2 normal. No murmurs, rubs, or gallops.  ABDOMEN: Soft, nontender, nondistended. Bowel sounds present. No organomegaly or mass.  EXTREMITIES: No pedal edema, cyanosis, or clubbing.  NEUROLOGIC: Cranial nerves II through XII are intact. Muscle strength 5/5 in all extremities. Sensation intact. Gait not  checked.  PSYCHIATRIC: The patient is alert and oriented x 3.  SKIN: No obvious rash, lesion, or ulcer.    LABORATORY PANEL:   CBC  Recent Labs Lab 12/28/15 0325  WBC 16.8*  HGB 12.2*  HCT 35.9*  PLT 424   ------------------------------------------------------------------------------------------------------------------  Chemistries   Recent Labs Lab 12/28/15 0325  NA 131*  K 4.4  CL 94*  CO2 29  GLUCOSE 167*  BUN 15  CREATININE 0.55*  CALCIUM 8.9   ------------------------------------------------------------------------------------------------------------------  Cardiac Enzymes  Recent Labs Lab 12/28/15 0325  TROPONINI <0.03   ------------------------------------------------------------------------------------------------------------------  RADIOLOGY:  Dg Chest 1 View  Result Date: 12/27/2015 CLINICAL DATA:  Initial evaluation for productive cough, shortness of breath. EXAM: CHEST 1 VIEW COMPARISON:  Prior radiograph from 12/22/2015. FINDINGS: Cardiac and mediastinal silhouettes are stable in size and contour, and remain within normal limits. Atheromatous plaque present within the aortic arch. Lungs are normally inflated. Scattered emphysematous changes with perihilar and right apical parenchymal scarring again noted. Blunting of the right costophrenic angle is similar to prior, likely related to chronic pleural reaction/scarring. Surgical clips overlie the right hilum with adjacent suture material. There are increased patchy densities within knee right mid lung and retrocardiac left lower lobe, suspicious for possible infectious infiltrates given the provided history. No pulmonary edema. No definite pleural effusion. No pneumothorax. Visualized osseous structures are unchanged. IMPRESSION: 1. New patchy opacities within the right mid lung and retrocardiac left lower lobe, suspicious for possible infection/pneumonia given the provided history. 2. Otherwise stable  appearance of the lungs with underlying emphysema and chronic parenchymal scarring as above. Electronically Signed   By: Jeannine Boga M.D.   On: 12/27/2015 14:08    EKG:  Orders placed or performed during the hospital encounter of 12/27/15  . EKG 12-Lead  . EKG 12-Lead    ASSESSMENT AND PLAN:    #1. Acute respiratory failure with hypoxia due to pneumonia and questionable fluid overload Clinically better, off BiPAP, wean off to oxygen via nasal cannulas as tolerated continue steroids, inhalation therapy, nebulizers, antibiotics s/p 1 dose of Lasix intravenously, continue Lasix orally  following kidney function and potassium-4.4  #2. COPD exacerbation  continue budesonide, Solu-Medrol, duo nebs, follow clinically  #3. Multifocal pneumonia  get sputum cultures continue Rocephin and Zithromax, adjust antibiotics depending on culture results  #4. Leukocytosis, on steroids follow with antibiotic therapy  #5. Fluid overload, questionable right-sided heart failure due to COPD exacerbation, continue Lasix orally. ECHO ordered , if nml d/c lasix Following kidney function and potassium, get BNP  #6. Hyponatremia, follow with Lasix and sodium at 131 today-  #7. Diabetes mellitus, continue diabetic diet, outpatient medications, sliding scale insulin while in the hospital Started patient on Neurontin for diabetic neuropathy  #8. Tobacco abuse  Counseling discussed with patient, nicotine replacement therapy  initiated, patient is agreeable, claims cutting down on smoking.    transfer patient to floor      All the records are reviewed and case discussed with Care Management/Social Workerr. Management plans discussed with the patient, family and they are in agreement.  CODE STATUS: fc  TOTAL TIME TAKING CARE OF THIS PATIENT: 36 minutes.   POSSIBLE D/C IN 2-3 DAYS, DEPENDING ON CLINICAL CONDITION.  Note: This dictation was prepared with Dragon dictation along with  smaller phrase technology. Any transcriptional errors that result from this process are unintentional.   Nicholes Mango M.D on 12/28/2015 at 12:06 PM  Between 7am to 6pm - Pager - [phone] After 6pm go to [email or web address] - password [email or web address] [facility]  Office  [phone]  CC: Primary care physician; Valera Castle, MD

## 2015-12-28 NOTE — Care Management (Signed)
Per July CM assessment: Lives alone. Daughter is [name] [phone]). No home Health history. No history of skilled facility. Home oxygen through [facility],  2 liters per nasal cannula. RNCM will continue to follow.

## 2015-12-28 NOTE — Progress Notes (Signed)
SLP Cancellation Note  Patient Details Name: Bradley [name] MRN: [id number] DOB: 11-15-1950   Cancelled treatment:       Reason Eval/Treat Not Completed: SLP screened, no needs identified, will sign off (Reviewed chart notes; consulted pt and NSG. ) Met w/ pt who denied any s/s of oropharyngeal phase dysphagia; pt tolerating current oral diet w/out deficits, NSG agreed. Per chart and pt report, pt does have Esophageal phase dysmotility and discomfort at times which he is able to identify and use strategies(Reflux) to address it. Briefly discussed his use of strategies. No further needs indicated at this time. NSG updated.    Orinda Kenner, [place], CCC-SLP Stevee Valenta 12/28/2015, 2:07 PM

## 2015-12-28 NOTE — Progress Notes (Addendum)
Inpatient Diabetes Program Recommendations  AACE/ADA: New Consensus Statement on Inpatient Glycemic Control (2015)  Target Ranges:  Prepandial:   less than 140 mg/dL      Peak postprandial:   less than 180 mg/dL (1-2 hours)      Critically ill patients:  140 - 180 mg/dL   Lab Results  Component Value Date   GLUCAP 197 (H) 12/28/2015   HGBA1C 6.8 (H) 12/27/2015    Review of Glycemic Control  Results for [name], Bradley W (MRN [id number]) as of 12/28/2015 07:48  Ref. Range 12/27/2015 16:35 12/27/2015 22:17 12/28/2015 07:01  Glucose-Capillary Latest Ref Range: 65 - 99 mg/dL 264 (H) 249 (H) 197 (H)    Diabetes history: Type 2 Outpatient Diabetes medications: Glucophage 500-1000mg qday  Current orders for Inpatient glycemic control: Glucophage 500mg qam, Novolog moderate correction 0-15 units tid, Novolog 0-5 units qhs, Novolog 4 units tid with meals  * steroids 60mg q12h  Inpatient Diabetes Program Recommendations:   Agree with current medications for blood sugar management.   Met with patient at the bedside to explain the use of insulin while he is inpatient. He was aware that steroids caused his number to go.   Admits to NOT checking blood sugars at home- because he does not have a meter.   Encouraged to reach out to the [facility] 24 hour nurse line at any time including if he has questions post discharge.   Julie Montpellier, RN, BA, MHA, CDE Diabetes Coordinator Inpatient Diabetes Program  [phone] (Team Pager) [phone] ([facility] Office) 12/28/2015 7:51 AM       

## 2015-12-28 NOTE — Care Management Note (Signed)
Case Management Note  Patient Details  Name: Bradley [name] MRN: [id number] Date of Birth: Nov 06, 1950  Subjective/Objective:                   Met with patient to discuss discharge planning. He is alert and oriented but states his daughter helps him with medical decision making. His daughter was not present at time of my assessmentSabino Dick [phone]. He drives and typically does not require any assistive device to ambulate. His PCP is with [facility] in [place]. He uses [facility] for medications. He states his inhalers are quite expensive and states that he is in the "donut hole". His wife died about a year ago and left him with a rollator. He does not believe that he has a front-wheeled walker. He lives alone. He has had chronic O2 about a year now [facility]. Action/Plan:   Home health list delivered to patient (he cannot afford any co-pays). [facility] referral to [facility] for assistance- patient notified. RNCM will continue to follow.   Expected Discharge Date:                  Expected Discharge Plan:     In-House Referral:     Discharge planning Services  Medication Assistance  Post Acute Care Choice:  Home Health, Durable Medical Equipment Choice offered to:  Patient  DME Arranged:    DME Agency:     HH Arranged:    [place] Agency:     Status of Service:  In process, will continue to follow  If discussed at Long Length of Stay Meetings, dates discussed:    Additional Comments:  Marshell Garfinkel, RN 12/28/2015, 10:55 AM

## 2015-12-28 NOTE — Progress Notes (Signed)
Report called to [employer]. Vital sign stable, no signs of respiratory distress at this time. Patient will be transported via wheelchair with oxygen.

## 2015-12-28 NOTE — Consult Note (Signed)
[facility] Inpatient Consult   12/28/2015  Bradley [name] April 19, 1950 [id number]  Chart review revealed patient eligible for [place] Management services and post hospital discharge follow up related to a diagnosis of COPD and DM. Patient was evaluated for community based chronic disease management services with [facility] care Management Program as a benefit of patient's Health [employer] Ad[employer] Medicare. Met with the patient at the bedside to explain [place] Management services. Patient expressed a need to also speak with a [facility] pharmacist related to medication affordability. Consent form signed. Patient gave [phone] as the best number to reach him. He also gave written permission to call his daughter [name] at [phone]) or [phone]) if he can not be reached. Patient will receive post hospital discharge calls and be evaluated for monthly home visits. [facility] Care Management services does not interfere with or replace any services arranged by the inpatient care management team. RNCM left contact information and [facility] literature at the bedside. Made inpatient RNCM aware that [facility] will be following for care management. For additional questions please contact:   Aveah Castell RN, [place] Liaison  [phone]) Business Mobile [phone]) Toll free office

## 2015-12-29 ENCOUNTER — Inpatient Hospital Stay
Admit: 2015-12-29 | Discharge: 2015-12-29 | Disposition: A | Discharge status: Home / Self Care | Payer: PPO | Attending: Internal Medicine | Admitting: Internal Medicine

## 2015-12-29 DIAGNOSIS — J9621 Acute and chronic respiratory failure with hypoxia: Secondary | ICD-10-CM | POA: Diagnosis not present

## 2015-12-29 DIAGNOSIS — J189 Pneumonia, unspecified organism: Secondary | ICD-10-CM | POA: Diagnosis not present

## 2015-12-29 DIAGNOSIS — R0600 Dyspnea, unspecified: Secondary | ICD-10-CM | POA: Diagnosis not present

## 2015-12-29 DIAGNOSIS — J441 Chronic obstructive pulmonary disease with (acute) exacerbation: Secondary | ICD-10-CM | POA: Diagnosis not present

## 2015-12-29 LAB — CBC
HEMATOCRIT: 36.9 % — AB (ref 40.0–52.0)
HEMOGLOBIN: 12.4 g/dL — AB (ref 13.0–18.0)
MCH: 26.6 pg (ref 26.0–34.0)
MCHC: 33.7 g/dL (ref 32.0–36.0)
MCV: 79.1 fL — AB (ref 80.0–100.0)
PLATELETS: 436 10*3/uL (ref 150–440)
RBC: 4.67 MIL/uL (ref 4.40–5.90)
RDW: 15.8 % — ABNORMAL HIGH (ref 11.5–14.5)
WBC: 19.4 10*3/uL — AB (ref 3.8–10.6)

## 2015-12-29 LAB — GLUCOSE, CAPILLARY
GLUCOSE-CAPILLARY: 182 mg/dL — AB (ref 65–99)
GLUCOSE-CAPILLARY: 305 mg/dL — AB (ref 65–99)
GLUCOSE-CAPILLARY: 229 mg/dL — AB (ref 65–99)
Glucose-Capillary: 279 mg/dL — ABNORMAL HIGH (ref 65–99)

## 2015-12-29 MED ORDER — INSULIN DETEMIR 100 UNIT/ML ~~LOC~~ SOLN
12.0000 [IU] | Freq: Every day | SUBCUTANEOUS | Status: DC
  Administered 2015-12-29: 12 [IU] via SUBCUTANEOUS
  Filled 2015-12-29 (×2): qty 0.12

## 2015-12-29 MED ORDER — PREDNISONE 50 MG PO TABS
50.0000 mg | ORAL_TABLET | Freq: Every day | ORAL | Status: DC
  Administered 2015-12-30: 50 mg via ORAL
  Filled 2015-12-29: qty 1

## 2015-12-29 MED ORDER — INSULIN ASPART 100 UNIT/ML ~~LOC~~ SOLN
5.0000 [IU] | Freq: Three times a day (TID) | SUBCUTANEOUS | Status: DC
  Administered 2015-12-29 – 2015-12-30 (×2): 5 [IU] via SUBCUTANEOUS
  Filled 2015-12-29 (×2): qty 5

## 2015-12-29 NOTE — Progress Notes (Signed)
Received pt from [facility]

## 2015-12-29 NOTE — Progress Notes (Signed)
Inpatient Diabetes Program Recommendations  AACE/ADA: New Consensus Statement on Inpatient Glycemic Control (2015)  Target Ranges:  Prepandial:   less than 140 mg/dL      Peak postprandial:   less than 180 mg/dL (1-2 hours)      Critically ill patients:  140 - 180 mg/dL   Lab Results  Component Value Date   GLUCAP 279 (H) 12/29/2015   HGBA1C 6.8 (H) 12/27/2015    Review of Glycemic Control:  Results for NOACH, CALVILLO (MRN [id number]) as of 12/29/2015 11:20  Ref. Range 12/28/2015 07:01 12/28/2015 11:11 12/28/2015 17:50 12/28/2015 21:23 12/29/2015 07:53 12/29/2015 11:10  Glucose-Capillary Latest Ref Range: 65 - 99 mg/dL 197 (H) 248 (H) 177 (H) 273 (H) 182 (H) 279 (H)   Diabetes history: Type 2 Outpatient Diabetes medications: Glucophage 500-'1000mg'$  qday  Current orders for Inpatient glycemic control: Glucophage '500mg'$  qam, Novolog moderate correction 0-15 units tid, Novolog 0-5 units qhs, Novolog 4 units tid with meals  * steroids '60mg'$  q12h  Inpatient Diabetes Program Recommendations:    While in the hospital and on steroids, consider adding Levemir 12 units q AM.  Also may need to increase meal coverage to 5 units tid with meals.  A1C indicates good control of diabetes prior to admit.  Therefore patient should not need insulin at home.   Thanks, Adah Perl, RN, BC-ADM Inpatient Diabetes Coordinator Pager [phone] (8a-5p)

## 2015-12-29 NOTE — Evaluation (Signed)
Physical Therapy Evaluation Patient Details Name: ERCELL RAZON MRN: [id number] DOB: 1950-03-20 Today's Date: 12/29/2015   History of Present Illness  65 y.o. male with a known history of COPD, ongoing tobacco abuse, coronary artery disease, diabetes mellitus, hypertension, who presents to the hospital with complaints of 4-5 day history of shortness of breath, low-grade fevers to 17F, cough with green phlegm, wheezing, swollen feet, worse than before  Clinical Impression  Pt did well with ambulation with no safety concerns. He showed good confidence and was able to maintain community appropriate speed the entire session.  Pt overall did well but did have a drop in O2 from mid 90s (2 liters at rest) to 89-91% post ambulation (2 liters during ambulation).  Overall pt did well and feels confident about being able to safely go home.  Pt does not require further PT intervention.     Follow Up Recommendations No PT follow up    Equipment Recommendations       Recommendations for Other Services       Precautions / Restrictions Precautions Precautions: None Restrictions Weight Bearing Restrictions: No      Mobility  Bed Mobility Overal bed mobility: Independent                Transfers Overall transfer level: Independent Equipment used: None             General transfer comment: Pt able to rise without hesitation, no LOBs and generally showed no issues/concerns  Ambulation/Gait Ambulation/Gait assistance: Independent Ambulation Distance (Feet): 200 Feet Assistive device: None     Gait velocity interpretation: >2.62 ft/sec, indicative of independent community ambulator General Gait Details: Pt walks with appropriate speed and cadence, he has no balance or safety concerns and ultimately did very well.   Stairs            Wheelchair Mobility    Modified Rankin (Stroke Patients Only)       Balance Overall balance assessment: Independent                                           Pertinent Vitals/Pain Pain Assessment: No/denies pain    Home Living Family/patient expects to be discharged to:: Private residence Living Arrangements: Alone Available Help at Discharge: Family   Home Access: [place]:  (O2, pt uses 1.5-2 liters)      Prior Function Level of Independence: Independent         Comments: Pt reports he is out of the house everyday and able to do most of what he needs.  He is not able to walk long distances and uses the electric scooter at box stores, etc     Hand Dominance        Extremity/Trunk Assessment   Upper Extremity Assessment: Overall WFL for tasks assessed           Lower Extremity Assessment: Overall WFL for tasks assessed         Communication   Communication: No difficulties  Cognition Arousal/Alertness: Awake/alert Behavior During Therapy: WFL for tasks assessed/performed Overall Cognitive Status: Within Functional Limits for tasks assessed                      General Comments      Exercises     Assessment/Plan    PT Assessment  Patent does not need any further PT services  PT Problem List            PT Treatment Interventions      PT Goals (Current goals can be found in the Care Plan section)  Acute Rehab PT Goals Patient Stated Goal: quite smoking    Frequency     Barriers to discharge        Co-evaluation               End of Session Equipment Utilized During Treatment: Oxygen (2 liters) Activity Tolerance: Patient tolerated treatment well;Patient limited by fatigue Patient left: in chair;with call bell/phone within reach           Time: 3559-7416 PT Time Calculation (min) (ACUTE ONLY): 16 min   Charges:   PT Evaluation $PT Eval Low Complexity: 1 Procedure     PT G CodesKreg Shropshire, DPT 12/29/2015, 4:54 PM

## 2015-12-29 NOTE — Progress Notes (Signed)
[place] [facility] at [place] NAME: Azariel Banik    MR#:  [id number]  DATE OF BIRTH:  [birth year]  SUBJECTIVE:  CHIEF COMPLAINT:  Patient is feeling much better. Off BiPAP.Reports shortness of breath with exertion  REVIEW OF SYSTEMS:  CONSTITUTIONAL: No fever, fatigue or weakness.  EYES: No blurred or double vision.  EARS, NOSE, AND THROAT: No tinnitus or ear pain.  RESPIRATORY: No cough, Denies shortness of breath while resting , no wheezing or hemoptysis.  CARDIOVASCULAR: No chest pain, orthopnea, edema.  GASTROINTESTINAL: No nausea, vomiting, diarrhea or abdominal pain.  GENITOURINARY: No dysuria, hematuria.  ENDOCRINE: No polyuria, nocturia,  HEMATOLOGY: No anemia, easy bruising or bleeding SKIN: No rash or lesion. MUSCULOSKELETAL: No joint pain or arthritis.   NEUROLOGIC: No tingling, numbness, weakness.  PSYCHIATRY: No anxiety or depression.   DRUG ALLERGIES:  No Known Allergies  VITALS:  Blood pressure (!) 109/52, pulse 85, temperature 97.9 F (36.6 C), temperature source Oral, resp. rate 18, height '5\' 7"'$  (1.702 m), weight 63.1 kg (139 lb 1.8 oz), SpO2 94 %.  PHYSICAL EXAMINATION:  GENERAL:  65 y.o.-year-old patient lying in the bed with no acute distress.  EYES: Pupils equal, round, reactive to light and accommodation. No scleral icterus. Extraocular muscles intact.  HEENT: Head atraumatic, normocephalic. Oropharynx and nasopharynx clear.  NECK:  Supple, no jugular venous distention. No thyroid enlargement, no tenderness.  LUNGS: moderate breath sounds bilaterally, no wheezing, rales,rhonchi or crepitation. No use of accessory muscles of respiration.  CARDIOVASCULAR: S1, S2 normal. No murmurs, rubs, or gallops.  ABDOMEN: Soft, nontender, nondistended. Bowel sounds present. No organomegaly or mass.  EXTREMITIES: No pedal edema, cyanosis, or clubbing.  NEUROLOGIC: Cranial nerves II through XII are intact. Muscle strength 5/5 in all  extremities. Sensation intact. Gait not checked.  PSYCHIATRIC: The patient is alert and oriented x 3.  SKIN: No obvious rash, lesion, or ulcer.    LABORATORY PANEL:   CBC  Recent Labs Lab 12/29/15 0959  WBC 19.4*  HGB 12.4*  HCT 36.9*  PLT 436   ------------------------------------------------------------------------------------------------------------------  Chemistries   Recent Labs Lab 12/28/15 0325  NA 131*  K 4.4  CL 94*  CO2 29  GLUCOSE 167*  BUN 15  CREATININE 0.55*  CALCIUM 8.9   ------------------------------------------------------------------------------------------------------------------  Cardiac Enzymes  Recent Labs Lab 12/28/15 0325  TROPONINI <0.03   ------------------------------------------------------------------------------------------------------------------  RADIOLOGY:  No results found.  EKG:   Orders placed or performed during the hospital encounter of 12/27/15  . EKG 12-Lead  . EKG 12-Lead    ASSESSMENT AND PLAN:    #1. Acute respiratory failure with hypoxia due to pneumonia and questionable fluid overload Clinically better, off BiPAP, wean off to oxygen via nasal cannulas as tolerated continue steroids, Changed to by mouth steroids, inhalation therapy, nebulizers, antibiotics s/p 1 dose of Lasix intravenously, continue Lasix orally  following kidney function and potassium-4.4  #2. COPD exacerbation  continue budesonide Tapered Solu-Medrol to by mouth prednisone, duo nebs, follow clinically  #3. Multifocal pneumonia  get sputum cultures continue Rocephin and Zithromax, adjust antibiotics depending on culture results  #4. Leukocytosis, on steroids follow with antibiotic therapy  #5. Fluid overload, questionable right-sided heart failure due to COPD exacerbation, continue Lasix orally. ECHO ordered , if nml d/c lasix Following kidney function and potassium, get BNP  #6. Hyponatremia, follow with Lasix and sodium at  131 today-  #7. Diabetes mellitus, continue diabetic diet, outpatient medications, sliding scale insulin while in the  hospital Started patient on Neurontin for diabetic neuropathy  #8. Tobacco abuse  Counseling discussed with patient, nicotine replacement therapy  initiated, patient is agreeable, claims cutting down on smoking.    transfer patient to floor      All the records are reviewed and case discussed with Care Management/Social Workerr. Management plans discussed with the patient, family and they are in agreement.  CODE STATUS: fc  TOTAL TIME TAKING CARE OF THIS PATIENT: 36 minutes.   POSSIBLE D/C IN 1-2 DAYS, DEPENDING ON CLINICAL CONDITION.  Note: This dictation was prepared with Dragon dictation along with smaller phrase technology. Any transcriptional errors that result from this process are unintentional.   Nicholes Mango M.D on 12/29/2015 at 3:58 PM  Between 7am to 6pm - Pager - [phone] After 6pm go to [email or web address] - password [email or web address] [facility]  Office  [phone]  CC: Primary care physician; Valera [place], MD

## 2015-12-29 NOTE — Care Management Important Message (Signed)
Important Message  Patient Details  Name: Bradley [name] MRN: [id number] Date of Birth: 1950-03-18   Medicare Important Message Given:  Yes    Jolly Mango, RN 12/29/2015, 9:03 AM

## 2015-12-30 ENCOUNTER — Telehealth: Payer: Self-pay | Admitting: *Deleted

## 2015-12-30 DIAGNOSIS — J9621 Acute and chronic respiratory failure with hypoxia: Secondary | ICD-10-CM | POA: Diagnosis not present

## 2015-12-30 DIAGNOSIS — J189 Pneumonia, unspecified organism: Secondary | ICD-10-CM | POA: Diagnosis not present

## 2015-12-30 DIAGNOSIS — J441 Chronic obstructive pulmonary disease with (acute) exacerbation: Secondary | ICD-10-CM | POA: Diagnosis not present

## 2015-12-30 LAB — GLUCOSE, CAPILLARY
Glucose-Capillary: 161 mg/dL — ABNORMAL HIGH (ref 65–99)
Glucose-Capillary: 125 mg/dL — ABNORMAL HIGH (ref 65–99)
Glucose-Capillary: 125 mg/dL — ABNORMAL HIGH (ref 65–99)

## 2015-12-30 LAB — CULTURE, RESPIRATORY W GRAM STAIN

## 2015-12-30 LAB — CBC
HCT: 37.6 % — ABNORMAL LOW (ref 40.0–52.0)
Hemoglobin: 12.3 g/dL — ABNORMAL LOW (ref 13.0–18.0)
MCH: 26.1 pg (ref 26.0–34.0)
MCHC: 32.8 g/dL (ref 32.0–36.0)
MCV: 79.4 fL — AB (ref 80.0–100.0)
PLATELETS: 506 10*3/uL — AB (ref 150–440)
RBC: 4.73 MIL/uL (ref 4.40–5.90)
RDW: 16 % — AB (ref 11.5–14.5)
WBC: 21.9 10*3/uL — ABNORMAL HIGH (ref 3.8–10.6)

## 2015-12-30 LAB — BASIC METABOLIC PANEL
Anion gap: 5 (ref 5–15)
BUN: 20 mg/dL (ref 6–20)
CHLORIDE: 94 mmol/L — AB (ref 101–111)
CO2: 32 mmol/L (ref 22–32)
CREATININE: 0.56 mg/dL — AB (ref 0.61–1.24)
Calcium: 8.7 mg/dL — ABNORMAL LOW (ref 8.9–10.3)
GFR calc Af Amer: >60 mL/min (ref >60)
GFR calc non Af Amer: >60 mL/min (ref >60)
GLUCOSE: 105 mg/dL — AB (ref 65–99)
Potassium: 4.4 mmol/L (ref 3.5–5.1)
Sodium: 131 mmol/L — ABNORMAL LOW (ref 135–145)

## 2015-12-30 LAB — CULTURE, RESPIRATORY: CULTURE: NORMAL

## 2015-12-30 MED ORDER — AZITHROMYCIN 250 MG PO TABS
250.0000 mg | ORAL_TABLET | Freq: Every day | ORAL | Status: DC
  Administered 2015-12-30: 250 mg via ORAL
  Filled 2015-12-30: qty 1

## 2015-12-30 MED ORDER — CEFUROXIME AXETIL 500 MG PO TABS: 500.0000 mg | ORAL_TABLET | Freq: Two times a day (BID) | ORAL | 0 refills | Status: DC

## 2015-12-30 MED ORDER — GUAIFENESIN-DM 100-10 MG/5ML PO SYRP: 10.0000 mL | ORAL_SOLUTION | Freq: Four times a day (QID) | ORAL | 0 refills | Status: DC | PRN

## 2015-12-30 MED ORDER — AZITHROMYCIN 250 MG PO TABS: ORAL_TABLET | ORAL | 0 refills | Status: DC

## 2015-12-30 MED ORDER — METFORMIN HCL 500 MG PO TABS: 500.0000 mg | ORAL_TABLET | Freq: Two times a day (BID) | ORAL | Status: DC

## 2015-12-30 MED ORDER — CEFUROXIME AXETIL 500 MG PO TABS
500.0000 mg | ORAL_TABLET | Freq: Two times a day (BID) | ORAL | Status: DC
  Administered 2015-12-30: 500 mg via ORAL
  Filled 2015-12-30: qty 1

## 2015-12-30 MED ORDER — METFORMIN HCL 500 MG PO TABS: 500.0000 mg | ORAL_TABLET | Freq: Two times a day (BID) | ORAL | 0 refills | Status: DC

## 2015-12-30 NOTE — Progress Notes (Signed)
Patient being discharged to home. Discharge & Rx instructions given and pt acknowledged understanding. Family here to take home.

## 2015-12-30 NOTE — Discharge Instructions (Signed)
STOP smoking Use your oxygen and nebs as before

## 2015-12-30 NOTE — Care Management Note (Signed)
Case Management Note  Patient Details  Name: Bradley [name] MRN: [id number] Date of Birth: 02-17-1951  Subjective/Objective:    Discharge home today. Has [facility] RN following him at home. No additional home health services ordered. Chronic 2L N/C.               Action/Plan:   Expected Discharge Date:                  Expected Discharge Plan:     In-House Referral:     Discharge planning Services  Medication Assistance  Post Acute Care Choice:  Home Health, Durable Medical Equipment Choice offered to:  Patient  DME Arranged:    DME Agency:     HH Arranged:    [place] Agency:     Status of Service:  In process, will continue to follow  If discussed at Long Length of Stay Meetings, dates discussed:    Additional Comments:  Emon Lance A, RN 12/30/2015, 9:39 AM

## 2015-12-30 NOTE — Telephone Encounter (Signed)
Courtesy call back, verified DOB, patient reported feeling much better.

## 2015-12-30 NOTE — Discharge Summary (Signed)
Bradley [name] at [place] NAME: Bradley [name]    MR#:  [id number]  DATE OF BIRTH:  [birth year]  DATE OF ADMISSION:  12/27/2015 ADMITTING PHYSICIAN: Theodoro Grist, MD  DATE OF DISCHARGE: 1028/17  PRIMARY CARE PHYSICIAN: Valera Castle, MD    ADMISSION DIAGNOSIS:  COPD exacerbation ([place]) [J44.1] Community acquired pneumonia, unspecified laterality [J18.9]  DISCHARGE DIAGNOSIS:  Acute on Chronic COPD exacerbation Chronic respiratory failure on oxygen at home Pneumonia Tobacco abuse Leucocytosis (infection and steroid effect)   SECONDARY DIAGNOSIS:   Past Medical History:  Diagnosis Date  . COPD (chronic obstructive pulmonary disease) ([place])   . Coronary artery disease   . Diabetes mellitus without complication ([place])   . Hypertension     HOSPITAL COURSE:   #1. Acute respiratory failure with hypoxia due to pneumonia\ -Clinically better, off BiPAP, wean off to oxygen via nasal cannula as tolerated -pt has home oxygen -continue steroids, inhalation therapy, nebulizers, antibiotics s/p 1 dose of Lasix intravenously following kidney function and potassium-4.4  #2. COPD exacerbation  continuebudesonide Tapered Solu-Medrol to by mouth prednisone, duo nebs, follow clinically  #3. Multifocal pneumonia continue Rocephin and Zithromax--->change to po ceftin and zithromax  #4. Leukocytosis, on steroids follow with antibiotic therapy  #5. Fluid overload, questionable right-sided heart failure due to COPD exacerbation -no s/o heart failure -BNP 64 -d/c lasix (no need for it)  #6. Hyponatremia-na 131  #7. Diabetes mellitus, continue diabetic diet, outpatient medications, sliding scale insulin while in the hospital Increased metformin to bid since pt on steroids. Once off steroids and sugars better change to daily else keep it bid--Defer to PCP -no need for insulin at home  #8. Tobacco abuse  Counseling discussed with  patient, nicotine replacement therapy  initiated, patient is agreeable, claims cutting down on smoking.   Overall better. D/c home CONSULTS OBTAINED:  Treatment Team:  Fritzi Mandes, MD  DRUG ALLERGIES:  No Known Allergies  DISCHARGE MEDICATIONS:   Current Discharge Medication List    START taking these medications   Details  cefUROXime (CEFTIN) 500 MG tablet Take 1 tablet (500 mg total) by mouth 2 (two) times daily with a meal. Qty: 14 tablet, Refills: 0    guaiFENesin-dextromethorphan (ROBITUSSIN DM) 100-10 MG/5ML syrup Take 10 mLs by mouth every 6 (six) hours as needed for cough. Qty: 118 mL, Refills: 0      CONTINUE these medications which have CHANGED   Details  azithromycin (ZITHROMAX) 250 MG tablet Take as directed Qty: 4 each, Refills: 0    metFORMIN (GLUCOPHAGE) 500 MG tablet Take 1 tablet (500 mg total) by mouth 2 (two) times daily with a meal. Takes every morning and then takes another one in the evening if his "sugar starts acting up" Qty: 60 tablet, Refills: 0      CONTINUE these medications which have NOT CHANGED   Details  albuterol (PROVENTIL) (2.5 MG/3ML) 0.083% nebulizer solution Take 2.5 mg by nebulization every 6 (six) hours as needed for wheezing or shortness of breath.    aspirin EC 81 MG tablet Take 81 mg by mouth daily.    clopidogrel (PLAVIX) 75 MG tablet Take 75 mg by mouth daily.    enalapril (VASOTEC) 10 MG tablet Take 10 mg by mouth daily.    Fluticasone-Salmeterol (ADVAIR) 250-50 MCG/DOSE AEPB Inhale 1 puff into the lungs 2 (two) times daily. Qty: 60 each, Refills: 0    metoprolol (LOPRESSOR) 50 MG tablet Take 50 mg by mouth 2 (  two) times daily.    mirtazapine (REMERON) 15 MG tablet Take 1 tablet (15 mg total) by mouth at bedtime. Qty: 30 tablet, Refills: 0    nitroGLYCERIN (NITROSTAT) 0.4 MG SL tablet Place 0.4 mg under the tongue every 5 (five) minutes as needed for chest pain.    predniSONE (DELTASONE) 20 MG tablet Take 3 tablets (60  mg total) by mouth daily. Qty: 15 tablet, Refills: 0    simvastatin (ZOCOR) 20 MG tablet Take 20 mg by mouth at bedtime.     tiotropium (SPIRIVA) 18 MCG inhalation capsule Place 1 capsule (18 mcg total) into inhaler and inhale daily. Qty: 30 capsule, Refills: 0        If you experience worsening of your admission symptoms, develop shortness of breath, life threatening emergency, suicidal or homicidal thoughts you must seek medical attention immediately by calling 911 or calling your MD immediately  if symptoms less severe.  You Must read complete instructions/literature along with all the possible adverse reactions/side effects for all the Medicines you take and that have been prescribed to you. Take any new Medicines after you have completely understood and accept all the possible adverse reactions/side effects.   Please note  You were cared for by a hospitalist during your hospital stay. If you have any questions about your discharge medications or the care you received while you were in the hospital after you are discharged, you can call the unit and asked to speak with the hospitalist on call if the hospitalist that took care of you is not available. Once you are discharged, your primary care physician will handle any further medical issues. Please note that NO REFILLS for any discharge medications will be authorized once you are discharged, as it is imperative that you return to your primary care physician (or establish a relationship with a primary care physician if you do not have one) for your aftercare needs so that they can reassess your need for medications and monitor your lab values. Today   SUBJECTIVE   Doing well. Some cough. Breathing is at baseline  VITAL SIGNS:  Blood pressure 119/66, pulse 98, temperature 98 F (36.7 C), temperature source Oral, resp. rate 18, height '5\' 7"'$  (1.702 m), weight 63.1 kg (139 lb 1.8 oz), SpO2 92 %.  I/O:    Intake/Output Summary (Last 24  hours) at 12/30/15 0919 Last data filed at 12/29/15 2135  Gross per 24 hour  Intake              830 ml  Output              551 ml  Net              279 ml    PHYSICAL EXAMINATION:  GENERAL:  65 y.o.-year-old patient lying in the bed with no acute distress.  EYES: Pupils equal, round, reactive to light and accommodation. No scleral icterus. Extraocular muscles intact.  HEENT: Head atraumatic, normocephalic. Oropharynx and nasopharynx clear.  NECK:  Supple, no jugular venous distention. No thyroid enlargement, no tenderness.  LUNGS: Normal breath sounds bilaterally, no wheezing, rales,rhonchi or crepitation. No use of accessory muscles of respiration.  CARDIOVASCULAR: S1, S2 normal. No murmurs, rubs, or gallops.  ABDOMEN: Soft, non-tender, non-distended. Bowel sounds present. No organomegaly or mass.  EXTREMITIES: No pedal edema, cyanosis, or clubbing.  NEUROLOGIC: Cranial nerves II through XII are intact. Muscle strength 5/5 in all extremities. Sensation intact. Gait not checked.  PSYCHIATRIC: The patient is alert and  oriented x 3.  SKIN: No obvious rash, lesion, or ulcer.   DATA REVIEW:   CBC   Recent Labs Lab 12/30/15 0516  WBC 21.9*  HGB 12.3*  HCT 37.6*  PLT 506*    Chemistries   Recent Labs Lab 12/28/15 0325  NA 131*  K 4.4  CL 94*  CO2 29  GLUCOSE 167*  BUN 15  CREATININE 0.55*  CALCIUM 8.9    Microbiology Results   Recent Results (from the past 240 hour(s))  CULTURE, BLOOD (ROUTINE X 2) w Reflex to ID Panel     Status: None (Preliminary result)   Collection Time: 12/27/15  1:21 PM  Result Value Ref Range Status   Specimen Description BLOOD L HAND  Final   Special Requests BOTTLES DRAWN AEROBIC AND ANAEROBIC 10CC  Final   Culture NO GROWTH 2 DAYS  Final   Report Status PENDING  Incomplete  Culture, sputum-assessment     Status: None   Collection Time: 12/27/15  3:31 PM  Result Value Ref Range Status   Specimen Description SPUTUM  Final   Special  Requests NONE  Final   Sputum evaluation THIS SPECIMEN IS ACCEPTABLE FOR SPUTUM CULTURE  Final   Report Status 12/27/2015 FINAL  Final  MRSA PCR Screening     Status: None   Collection Time: 12/27/15  3:31 PM  Result Value Ref Range Status   MRSA by PCR NEGATIVE NEGATIVE Final    Comment:        The GeneXpert MRSA Assay (FDA approved for NASAL specimens only), is one component of a comprehensive MRSA colonization surveillance program. It is not intended to diagnose MRSA infection nor to guide or monitor treatment for MRSA infections.   Culture, respiratory (NON-Expectorated)     Status: None (Preliminary result)   Collection Time: 12/27/15  3:31 PM  Result Value Ref Range Status   Specimen Description SPUTUM  Final   Special Requests NONE Reflexed from [id number]  Final   Gram Stain   Final    ABUNDANT WBC PRESENT, PREDOMINANTLY PMN ABUNDANT GRAM NEGATIVE COCCOBACILLI MODERATE GRAM POSITIVE COCCI IN PAIRS AND CHAINS MODERATE GRAM POSITIVE COCCI IN CLUSTERS FEW GRAM NEGATIVE RODS FEW SQUAMOUS EPITHELIAL CELLS PRESENT    Culture   Final    CULTURE REINCUBATED FOR BETTER GROWTH Performed at [facility]    Report Status PENDING  Incomplete  CULTURE, BLOOD (ROUTINE X 2) w Reflex to ID Panel     Status: None (Preliminary result)   Collection Time: 12/27/15  9:17 PM  Result Value Ref Range Status   Specimen Description BLOOD RIGHT AC  Final   Special Requests BOTTLES DRAWN AEROBIC AND ANAEROBIC 8CC  Final   Culture NO GROWTH 2 DAYS  Final   Report Status PENDING  Incomplete    RADIOLOGY:  No results found.   Management plans discussed with the patient, family and they are in agreement.  CODE STATUS:     Code Status Orders        Start     Ordered   12/27/15 1531  Full code  Continuous     12/27/15 1530    Code Status History    Date Active Date Inactive Code Status Order ID Comments User Context   09/19/2015  6:37 PM 09/20/2015  5:10 PM Full Code [id number]   Theodoro Grist, MD Inpatient   04/12/2015 10:23 AM 04/16/2015  8:48 PM Full Code [id number]  Vaughan Basta, MD Inpatient   10/08/2014  3:48  PM 10/11/2014  2:26 PM Full Code [id number]  Idelle Crouch, MD Inpatient    Advance Directive Documentation   Flowsheet Row Most Recent Value  Type of Advance Directive  Healthcare Power of Attorney, Living will  Pre-existing out of facility DNR order (yellow form or pink MOST form)  No data  "MOST" Form in Place?  No data      TOTAL TIME TAKING CARE OF THIS PATIENT: 40 minutes.    Zoie Sarin M.D on 12/30/2015 at 9:19 AM  Between 7am to 6pm - Pager - [phone] After 6pm go to [email or web address] - password [email or web address] [place] [facility]  Office  [phone]  CC: Primary care physician; Valera Castle, MD

## 2015-12-31 LAB — ECHOCARDIOGRAM COMPLETE
E decel time: 299 msec
E/e' ratio: 10.58
FS: 34 % (ref 28–44)
Height: 67 in
IVS/LV PW RATIO, ED: 0.87
LA vol A4C: 25.6 ml
LA vol index: 14.6 mL/m2
LAVOL: 25.3 mL
LV E/e' medial: 10.58
LV TDI E'LATERAL: 8.16
LV e' LATERAL: 8.16 cm/s
LVEEAVG: 10.58
Lateral S' vel: 13.4 cm/s
MV Dec: 299
MVPG: 3 mmHg
MVPKAVEL: 73.7 m/s
MVPKEVEL: 86.3 m/s
PW: 10.7 mm — AB (ref 0.6–1.1)
TAPSE: 21.7 mm
TDI e' medial: 8.49
WEIGHTICAEL: 2225.76 [oz_av]

## 2016-01-01 ENCOUNTER — Other Ambulatory Visit: Payer: Self-pay | Admitting: *Deleted

## 2016-01-01 ENCOUNTER — Encounter: Payer: Self-pay | Admitting: *Deleted

## 2016-01-01 LAB — CULTURE, BLOOD (ROUTINE X 2)
CULTURE: NO GROWTH
CULTURE: NO GROWTH

## 2016-01-01 NOTE — Patient Outreach (Signed)
Transition of care call successful, follow up on referral from [place] given 10/27 to follow pt for transition of care, recent hospitalization 10/25-10/28 COPD exacerbation, pneumonia.  Spoke with pt, HIPAA verified, discussed purpose of call.  Pt reports 60% better, oxygen currently at 2 liters [place], no sob sitting, only with exertion.  Pt reports taking all of his medications as ordered, no problems affording medications.  Pt reports nebulizer treatments helping, used his rescue inhaler twice since discharge home. Pt reports MD follow up appointments not scheduled and as discussed, pt to call both Primary Care MD and Lung MD to schedule follow up visits.  Pt reports has not been checking his sugars,cannot find his glucometer,suppose to have a glucometer sent in the mail.  RN CM discussed with pt [facility] transition of care program - follow for 31 days (weekly phone calls, a home visit) to which pt agreed.   Patient was recently discharged from hospital and all medications have been reviewed.   Plan:  Informed Dr. Kym Groom of [facility] involvement- barrier letter faxed.             Plan to continue to follow pt for transition of care, follow up again next week- home visit.    [name].   [facility]  [phone]

## 2016-01-03 DIAGNOSIS — J159 Unspecified bacterial pneumonia: Secondary | ICD-10-CM | POA: Diagnosis not present

## 2016-01-03 DIAGNOSIS — Z09 Encounter for follow-up examination after completed treatment for conditions other than malignant neoplasm: Secondary | ICD-10-CM | POA: Diagnosis not present

## 2016-01-03 DIAGNOSIS — E119 Type 2 diabetes mellitus without complications: Secondary | ICD-10-CM | POA: Diagnosis not present

## 2016-01-03 DIAGNOSIS — Z720 Tobacco use: Secondary | ICD-10-CM | POA: Diagnosis not present

## 2016-01-08 ENCOUNTER — Other Ambulatory Visit: Payer: Self-pay | Admitting: Pharmacist

## 2016-01-08 ENCOUNTER — Encounter: Payer: Self-pay | Admitting: *Deleted

## 2016-01-08 ENCOUNTER — Other Ambulatory Visit: Payer: Self-pay | Admitting: *Deleted

## 2016-01-08 NOTE — Patient Outreach (Signed)
[facility] [place] [facility]) Care Management  [place]   01/08/2016  Bradley [name] 1950-08-19 [id number]  Late entry for 01/08/16.    Subjective:  Patient was referred to [place] by [facility] for medication patient assistance evaluation.    Phone call with patient on 01/08/16 and he verified HIPAA details.  Patient was willing to review his medications over the phone with [facility] Pharmacist and reports that he has Spiriva and Pulmicort at this time.  Reports he is using Pulmicort samples and not using Advair.    He states he has his other medications and denies adherence problems.    Objective:   Current Medications: Current Outpatient Prescriptions  Medication Sig Dispense Refill  . albuterol (PROVENTIL) (2.5 MG/3ML) 0.083% nebulizer solution Take 2.5 mg by nebulization every 6 (six) hours as needed for wheezing or shortness of breath.    Marland Kitchen aspirin EC 81 MG tablet Take 81 mg by mouth daily.    Marland Kitchen azithromycin (ZITHROMAX) 250 MG tablet Take as directed (Patient not taking: Reported on 01/08/2016) 4 each 0  . cefUROXime (CEFTIN) 500 MG tablet Take 1 tablet (500 mg total) by mouth 2 (two) times daily with a meal. (Patient not taking: Reported on 01/08/2016) 14 tablet 0  . clopidogrel (PLAVIX) 75 MG tablet Take 75 mg by mouth daily.    . enalapril (VASOTEC) 10 MG tablet Take 10 mg by mouth daily.    . Fluticasone-Salmeterol (ADVAIR) 250-50 MCG/DOSE AEPB Inhale 1 puff into the lungs 2 (two) times daily. (Patient not taking: Reported on 01/08/2016) 60 each 0  . guaiFENesin-dextromethorphan (ROBITUSSIN DM) 100-10 MG/5ML syrup Take 10 mLs by mouth every 6 (six) hours as needed for cough. 118 mL 0  . metFORMIN (GLUCOPHAGE) 500 MG tablet Take 1 tablet (500 mg total) by mouth 2 (two) times daily with a meal. Takes every morning and then takes another one in the evening if his "sugar starts acting up" 60 tablet 0  . metoprolol (LOPRESSOR) 50 MG tablet Take 50 mg by  mouth 2 (two) times daily.    . mirtazapine (REMERON) 15 MG tablet Take 1 tablet (15 mg total) by mouth at bedtime. (Patient not taking: Reported on 01/08/2016) 30 tablet 0  . nitroGLYCERIN (NITROSTAT) 0.4 MG SL tablet Place 0.4 mg under the tongue every 5 (five) minutes as needed for chest pain.    . predniSONE (DELTASONE) 20 MG tablet Take 3 tablets (60 mg total) by mouth daily. (Patient not taking: Reported on 01/08/2016) 15 tablet 0  . simvastatin (ZOCOR) 20 MG tablet Take 20 mg by mouth at bedtime.     Marland Kitchen tiotropium (SPIRIVA) 18 MCG inhalation capsule Place 1 capsule (18 mcg total) into inhaler and inhale daily. 30 capsule 0   No current facility-administered medications for this visit.     Functional Status: In your present state of health, do you have any difficulty performing the following activities: 01/08/2016 12/27/2015  Hearing? N N  Vision? N N  Difficulty concentrating or making decisions? N N  Walking or climbing stairs? N N  Dressing or bathing? N N  Doing errands, shopping? N N  Preparing Food and eating ? N -  Using the Toilet? N -  In the past six months, have you accidently leaked urine? N -  Do you have problems with loss of bowel control? N -  Managing your Medications? N -  Managing your Finances? N -  Housekeeping or managing your Housekeeping?  N -  Some recent data might be hidden    Fall/Depression Screening: PHQ 2/9 Scores 01/08/2016  PHQ - 2 Score 0    Assessment:  Medication review per patient report and 12/30/15 hospital discharge summary.   Drugs sorted by system:  Neurologic/Psychologic: -mirtazapine as needed   Cardiovascular: -aspirin 81 mg  -clopidogrel  -enalapril  -metoprolol tartrate -sublingual nitroglycerin as needed -simvastatin  Pulmonary/Allergy: -albuterol nebs -Spiriva  -Pulmicort (per patient report)  Endocrine: -metformin  Of note, patient reports he completed his antibiotic therapy and prednisone from discharge.    His furosemide was discontinued on hospital discharge.   Medication assistance: Discussed [employer] Extra Help with patient---he states he isn't sure if he would meet requirements and wishes to speak with his daughter about it.   Discussed manufacturer patient assistance programs, income requirements, out-of-pocket prescription spend requirements, and that he would need to apply as soon as possible due to Medicare Part D beneficiaries being evaluated on a calendar year basis.   Plan:  Will route this note to patient's PCP.   Will place follow-up call to patient next week to see if he wishes to apply to [facility] Extra Help or evaluate if he may be eligible for manufacturer patient assistance programs.  Karrie Meres, PharmD, [place] [phone]

## 2016-01-08 NOTE — Patient Outreach (Signed)
[place] [facility]) Care Management   01/08/2016  Bradley [name] 05-02-1950 [id number]  Bradley [name] is an 65 y.o. male  Subjective: Pt reports since discharge, cut his oxygen down to 1.5 liters during the day(use as  Needed) and 1 liters at night.  Pt reports oxygen saturation ranges 92-94%, drops with walking.   Pt reports has cut his smoking from one pack a day to a pack every 2 days, allows himself one  Cigarette every 4 hours.  Pt reports saw Primary Care MD last week.  Objective:   Vitals:   01/08/16 1027  BP: 92/60  Pulse: 63  Resp: 20    ROS  Physical Exam  Constitutional: He is oriented to person, place, and time. He appears well-developed and well-nourished.  Cardiovascular: Normal rate, regular rhythm and normal heart sounds.   Respiratory: Breath sounds normal.  Sob noted with exertion   GI: Soft. Bowel sounds are normal.  Musculoskeletal: Normal range of motion. He exhibits no edema.  Neurological: He is alert and oriented to person, place, and time.  Skin: Skin is warm and dry.  Psychiatric: He has a normal mood and affect. His behavior is normal. Judgment and thought content normal.    Encounter Medications:   Outpatient Encounter Prescriptions as of 01/08/2016  Medication Sig Note  . albuterol (PROVENTIL) (2.5 MG/3ML) 0.083% nebulizer solution Take 2.5 mg by nebulization every 6 (six) hours as needed for wheezing or shortness of breath. 01/08/2016: Pt using every 4 hours   . aspirin EC 81 MG tablet Take 81 mg by mouth daily.   . clopidogrel (PLAVIX) 75 MG tablet Take 75 mg by mouth daily.   . enalapril (VASOTEC) 10 MG tablet Take 10 mg by mouth daily.   Marland Kitchen guaiFENesin-dextromethorphan (ROBITUSSIN DM) 100-10 MG/5ML syrup Take 10 mLs by mouth every 6 (six) hours as needed for cough. 01/08/2016: Takes as needed at night   . metFORMIN (GLUCOPHAGE) 500 MG tablet Take 1 tablet (500 mg total) by mouth 2 (two) times daily with a meal. Takes every morning and then  takes another one in the evening if his "sugar starts acting up"   . metoprolol (LOPRESSOR) 50 MG tablet Take 50 mg by mouth 2 (two) times daily.   . simvastatin (ZOCOR) 20 MG tablet Take 20 mg by mouth at bedtime.    Marland Kitchen tiotropium (SPIRIVA) 18 MCG inhalation capsule Place 1 capsule (18 mcg total) into inhaler and inhale daily.   Marland Kitchen azithromycin (ZITHROMAX) 250 MG tablet Take as directed (Patient not taking: Reported on 01/08/2016) 01/08/2016: Completed 2 days ago.   . cefUROXime (CEFTIN) 500 MG tablet Take 1 tablet (500 mg total) by mouth 2 (two) times daily with a meal. (Patient not taking: Reported on 01/08/2016) 01/08/2016: Completed   . Fluticasone-Salmeterol (ADVAIR) 250-50 MCG/DOSE AEPB Inhale 1 puff into the lungs 2 (two) times daily. (Patient not taking: Reported on 01/08/2016) 01/08/2016: Pt to start back once finished with sample of Pulmicort 180 mcg   . mirtazapine (REMERON) 15 MG tablet Take 1 tablet (15 mg total) by mouth at bedtime. (Patient not taking: Reported on 01/08/2016)   . nitroGLYCERIN (NITROSTAT) 0.4 MG SL tablet Place 0.4 mg under the tongue every 5 (five) minutes as needed for chest pain. 01/08/2016: Available if needed.   . predniSONE (DELTASONE) 20 MG tablet Take 3 tablets (60 mg total) by mouth daily. (Patient not taking: Reported on 01/08/2016) 01/08/2016: Completed.    No facility-administered encounter medications on file as  of 01/08/2016.     Functional Status:   In your present state of health, do you have any difficulty performing the following activities: 01/08/2016 12/27/2015  Hearing? N N  Vision? N N  Difficulty concentrating or making decisions? N N  Walking or climbing stairs? N N  Dressing or bathing? N N  Doing errands, shopping? N N  Preparing Food and eating ? N -  Using the Toilet? N -  In the past six months, have you accidently leaked urine? N -  Do you have problems with loss of bowel control? N -  Managing your Medications? N -  Managing your Finances?  N -  Housekeeping or managing your Housekeeping? N -  Some recent data might be hidden    Fall/Depression Screening:    PHQ 2/9 Scores 01/08/2016  PHQ - 2 Score 0    Assessment:  Pleasant 65 year old gentleman, lives alone.                           COPD-  O 2 saturation at rest 96%. Lungs clear, noted sob with exertion- used                                Rescue inhaler during home visit.  View of pt's f/u appointments, to see Dr.                                Raul Del 11/21, discussed with pt calling MD office to see if can see sooner.                                Information on COPD provided/reviewed- included benefits of smoking                                       Cessation.                           DM- pt not checking sugars, waiting for glucometer (was told while                                   In patient to receive one in the mail).   Sugar checked                                  During home visit- 114 (ate 2 hrs earlier).   Plan:   As discussed, pt to call Primary Care MD office, request prescription for glucometer/                Supplies be sent to his pharmacy, once obtained to start checking sugars.             As discussed, pt to call Dr. Raul Del office to see if need to have earlier f/u appointment.              RN CM to continue to follow pt for transition of care, follow up again next week  Telephonically.             RN CM to fax Dr. Kym Groom 11/06 home visit encounter.   [facility] CM Care Plan Problem One   Flowsheet Row Most Recent Value  Care Plan Problem One  Risk for readmission related to recent hospitalization - COPD exacerbation, pneumonia   Role Documenting the Problem One  Care Management Coordinator  Care Plan for Problem One  Active  [facility] Long Term Goal (31-90 days)  Pt would not readmit into the hospital within the next 31 days   [facility] Long Term Goal Start Date  01/01/16  Interventions for Problem One Long Term Goal  Home visit done.   [facility] CM  Short Term Goal #1 (0-30 days)  Pt would schedule f/u MD appointments within the next 7 days   [facility] CM Short Term Goal #1 Start Date  01/01/16  [facility] CM Short Term Goal #1 Met Date  01/08/16  Interventions for Short Term Goal #1  Discussed with pt callling Lung MD's office to see if need to do earlier appointment   [facility] CM Short Term Goal #2 (0-30 days)  Pt would receive glucometer in the mail/start checking sugars within the next 7-10 days   [facility] CM Short Term Goal #2 Start Date  01/01/16  [facility] CM Short Term Goal #3 (0-30 days)  (P) Pt would call MD office within next 8 days, request rx for new glucometer/supplies be sent to pharmacy   [facility] CM Short Term Goal #3 Start Date  (P) 01/08/16  Interventions for Short Tern Goal #3  (P) RN CM informed pt able to get a new glucometer/supplies through his insurance, need to call MD and request rx be sent to his pharmacy     [facility] CM Care Plan Problem Two   Flowsheet Row Most Recent Value  Care Plan Problem Two  (P) COPD- better self management   Role Documenting the Problem Two  (P) Care Management [place] for Problem Two  (P) Active

## 2016-01-15 DIAGNOSIS — J449 Chronic obstructive pulmonary disease, unspecified: Secondary | ICD-10-CM | POA: Diagnosis not present

## 2016-01-15 DIAGNOSIS — R0600 Dyspnea, unspecified: Secondary | ICD-10-CM | POA: Diagnosis not present

## 2016-01-16 ENCOUNTER — Other Ambulatory Visit: Payer: Self-pay | Admitting: *Deleted

## 2016-01-16 DIAGNOSIS — J449 Chronic obstructive pulmonary disease, unspecified: Secondary | ICD-10-CM | POA: Diagnosis not present

## 2016-01-16 DIAGNOSIS — J189 Pneumonia, unspecified organism: Secondary | ICD-10-CM | POA: Diagnosis not present

## 2016-01-16 DIAGNOSIS — R0609 Other forms of dyspnea: Secondary | ICD-10-CM | POA: Diagnosis not present

## 2016-01-16 DIAGNOSIS — R918 Other nonspecific abnormal finding of lung field: Secondary | ICD-10-CM | POA: Diagnosis not present

## 2016-01-16 NOTE — Patient Outreach (Signed)
Transition of care successful, ongoing follow up on recent hospitalization 10/25-10/28 COPD exacerbation, pneumonia.  Spoke with pt, HIPAA verified.  Pt reports doing good, not 100 % yet, to  see Dr. Raul Del today, called MD office as requested by RN CM, was able to get an earlier appointment.   Pt reports cutting back on his smoking is getting better.  Pt reports did call Primary Care office, let them know need a new glucometer, received a call back letting him know one is ready for pick up at the office.  Pt reports plans to pick up the glucometer today.    Plan: As discussed with pt, plan to follow up again next week telephonically as part of ongoing  Transition of care.     [name].   [place] [facility]  [phone]

## 2016-01-17 ENCOUNTER — Ambulatory Visit: Payer: Self-pay | Admitting: Pharmacist

## 2016-01-18 ENCOUNTER — Other Ambulatory Visit: Payer: Self-pay | Admitting: Pharmacist

## 2016-01-18 NOTE — Patient Outreach (Signed)
[facility]) Care Management  01/18/2016  Bradley [name] 09-10-1950 [id number]  Unsuccessful follow-up call to patient, HIPAA compliant message left requesting return call.   Plan:  Will make another outreach attempt to patient within the next week.   Karrie Meres, PharmD, [place] [phone]

## 2016-01-19 ENCOUNTER — Other Ambulatory Visit: Payer: Self-pay | Admitting: Pharmacist

## 2016-01-19 NOTE — Patient Outreach (Signed)
[facility]) Care Management  01/19/2016  Bradley [name] 12/26/1950 [id number]  Second unsuccessful attempt to reach patient.  HIPAA compliant voice message left requesting return call.   Plan:  Will make a third outreach call next week if no return call from patient.   Karrie Meres, PharmD, [place] [phone]

## 2016-01-23 ENCOUNTER — Other Ambulatory Visit: Payer: Self-pay | Admitting: *Deleted

## 2016-01-23 NOTE — Patient Outreach (Signed)
Transition of care call successful, ongoing follow up on recent hospitalization 10/25-10/28 COPD exacerbation, pneumonia.  Spoke with pt, HIPAA verified.  Pt reports getting better, discussed recent office visit with Dr. Raul Del, x ray done that day, showed pneumonia gone.   Pt reports no medication changes, MD did  not have any more samples to give,thinks has enough Advair until the first of the year.  Pt reports did pick up glucometer, MD called in a prescription to [facility], used it one time- sugar 132, needs a new battery which he cannot afford now.   RN CM discussed with pt taking glucometer back to [facility] since he just got it to which pt said he would do.   Pt reports smoking cessation is getting there, still doing 1/2 pack a day.   RN CM discussed with pt [facility] pharmacist Lennette Bihari has been trying to contact him to which pt requested a contact number to call him, contact number provided.    Plan:  As discussed with pt, plan to follow up again next week telephonically (part of ongoing transition of care).  [name].   [place] Care Management  [phone]

## 2016-01-24 ENCOUNTER — Ambulatory Visit: Payer: PPO | Admitting: Pharmacist

## 2016-01-24 ENCOUNTER — Other Ambulatory Visit: Payer: Self-pay | Admitting: Pharmacist

## 2016-01-24 ENCOUNTER — Ambulatory Visit: Payer: Self-pay | Admitting: *Deleted

## 2016-01-24 NOTE — Patient Outreach (Signed)
[facility]) Care Management  01/24/2016  Bradley [name] 01-29-1951 [id number]  Third outreach attempt to patient was successful, HIPAA verified by patient.   Purpose of call to follow-up patient's interest in patient assistance, specifically if he reviewed his financials to see if he thinks he meets requirements for [facility] Extra Help.    Patient reports he has not spoken with his daughter about this yet but that he still plans to.    He states he thinks he has enough Advair to last through the end of the calendar year.    Patient requests a follow-up call next week regarding [employer] Extra Help.    Plan:  Will make outreach attempt to patient next week per his request.     Karrie Meres, PharmD, [place] [phone]

## 2016-01-31 ENCOUNTER — Ambulatory Visit: Payer: PPO | Admitting: Pharmacist

## 2016-02-01 ENCOUNTER — Other Ambulatory Visit: Payer: Self-pay | Admitting: *Deleted

## 2016-02-01 NOTE — Patient Outreach (Signed)
Final transition of care call successful, follow up on recent hospitalization 10/25-10/28 COPD exacerbation, pneumonia.  Spoke with pt, HIPAA verified.   Pt reports doing good, breathing getting better, still uses oxygen at night- helps him sleep/get more rest.  Pt reports no sob, doing more walking around, visiting brother.  Pt reports he is taking all of his medications as ordered, looks like he should have enough Advair and Albuterol for the rest of the year, not too sure about Spiriva.   Pt reports he did not take back his glucometer to the pharmacy, read the instructions which indicated he needed to change his needle every day which he has been doing, machine now working.  Pt reports sugar today 114, ranges 114-133.  Pt reports his smoking is no worse or no better, continues to smoke 1/2 pack of cigarettes a day.  Pt reports to see Lung MD 12/11 and Primary Care MD in January.   RN CM discussed with pt today  final transition of care call, inquired about providing ongoing community nurse case management services to which pt declined.     Plan: As discussed with pt, plan to discharge from community nurse case management services- no further case management needs.          Care plan updated.           Update provided to [facility] clinical pharmacist of case closure.    [name].   [place] Care Management  [phone]

## 2016-02-02 ENCOUNTER — Encounter: Payer: Self-pay | Admitting: Pharmacist

## 2016-02-02 ENCOUNTER — Other Ambulatory Visit: Payer: Self-pay | Admitting: Pharmacist

## 2016-02-02 ENCOUNTER — Other Ambulatory Visit: Payer: Self-pay | Admitting: *Deleted

## 2016-02-02 NOTE — Patient Outreach (Signed)
[facility]) Care Management  02/02/2016  JAMARIAN JACINTO 14-Jul-1950 [id number]  Successful phone outreach to patient.  HIPAA verified by patient.   Patient states that he still isn't sure about his income for [facility] Extra Help.  Counseled patient applying to [facility] for Extra Help can be done by calling [employer], on [employer] website, or by going to local [place] office.    Patient counseled if his income meets requirements for [employer] Extra Help he would need a denial letter from [facility] in order to apply to manufacturer patient assistance program for [employer].   He states that he has enough Advair and albuterol to last through the end of the year and he is planning to see if his prescriber has Spiriva sample.  Patient denies other pharmacy needs and denies questions and doesn't wish to apply to [facility] Extra Help with [facility] Pharmacist assistance at this time.   Plan:  Will close case.   Will send case closure letter to MD.   Patient confirmed he has [facility] Pharmacist phone number should he have new needs later.    Karrie Meres, PharmD, [place] [phone]

## 2016-02-02 NOTE — Patient Outreach (Signed)
Note entry-   RN CM care team and episode resolved.    [name].   [place] Care Management  [phone]

## 2016-02-12 DIAGNOSIS — R0609 Other forms of dyspnea: Secondary | ICD-10-CM | POA: Diagnosis not present

## 2016-02-12 DIAGNOSIS — J439 Emphysema, unspecified: Secondary | ICD-10-CM | POA: Diagnosis not present

## 2016-02-14 DIAGNOSIS — R0600 Dyspnea, unspecified: Secondary | ICD-10-CM | POA: Diagnosis not present

## 2016-02-14 DIAGNOSIS — J449 Chronic obstructive pulmonary disease, unspecified: Secondary | ICD-10-CM | POA: Diagnosis not present

## 2016-03-16 DIAGNOSIS — R0600 Dyspnea, unspecified: Secondary | ICD-10-CM | POA: Diagnosis not present

## 2016-03-16 DIAGNOSIS — J449 Chronic obstructive pulmonary disease, unspecified: Secondary | ICD-10-CM | POA: Diagnosis not present

## 2016-04-02 DIAGNOSIS — J438 Other emphysema: Secondary | ICD-10-CM | POA: Diagnosis not present

## 2016-04-02 DIAGNOSIS — I10 Essential (primary) hypertension: Secondary | ICD-10-CM | POA: Diagnosis not present

## 2016-04-02 DIAGNOSIS — Z720 Tobacco use: Secondary | ICD-10-CM | POA: Diagnosis not present

## 2016-04-02 DIAGNOSIS — E119 Type 2 diabetes mellitus without complications: Secondary | ICD-10-CM | POA: Diagnosis not present

## 2016-04-02 DIAGNOSIS — E782 Mixed hyperlipidemia: Secondary | ICD-10-CM | POA: Diagnosis not present

## 2016-04-16 DIAGNOSIS — R0603 Acute respiratory distress: Secondary | ICD-10-CM | POA: Diagnosis not present

## 2016-04-16 DIAGNOSIS — J449 Chronic obstructive pulmonary disease, unspecified: Secondary | ICD-10-CM | POA: Diagnosis not present

## 2016-04-19 DIAGNOSIS — F17200 Nicotine dependence, unspecified, uncomplicated: Secondary | ICD-10-CM | POA: Diagnosis not present

## 2016-04-19 DIAGNOSIS — J439 Emphysema, unspecified: Secondary | ICD-10-CM | POA: Diagnosis not present

## 2016-04-19 DIAGNOSIS — E119 Type 2 diabetes mellitus without complications: Secondary | ICD-10-CM | POA: Diagnosis not present

## 2016-05-06 DIAGNOSIS — J439 Emphysema, unspecified: Secondary | ICD-10-CM | POA: Diagnosis not present

## 2016-05-06 DIAGNOSIS — Z720 Tobacco use: Secondary | ICD-10-CM | POA: Diagnosis not present

## 2016-05-10 DIAGNOSIS — F17200 Nicotine dependence, unspecified, uncomplicated: Secondary | ICD-10-CM | POA: Diagnosis not present

## 2016-05-10 DIAGNOSIS — J449 Chronic obstructive pulmonary disease, unspecified: Secondary | ICD-10-CM | POA: Diagnosis not present

## 2016-05-10 DIAGNOSIS — E119 Type 2 diabetes mellitus without complications: Secondary | ICD-10-CM | POA: Diagnosis not present

## 2016-05-14 DIAGNOSIS — R0603 Acute respiratory distress: Secondary | ICD-10-CM | POA: Diagnosis not present

## 2016-05-14 DIAGNOSIS — J449 Chronic obstructive pulmonary disease, unspecified: Secondary | ICD-10-CM | POA: Diagnosis not present

## 2016-05-31 DIAGNOSIS — J439 Emphysema, unspecified: Secondary | ICD-10-CM | POA: Diagnosis not present

## 2016-05-31 DIAGNOSIS — F17200 Nicotine dependence, unspecified, uncomplicated: Secondary | ICD-10-CM | POA: Diagnosis not present

## 2016-05-31 DIAGNOSIS — E119 Type 2 diabetes mellitus without complications: Secondary | ICD-10-CM | POA: Diagnosis not present

## 2016-06-14 DIAGNOSIS — J449 Chronic obstructive pulmonary disease, unspecified: Secondary | ICD-10-CM | POA: Diagnosis not present

## 2016-06-14 DIAGNOSIS — R0603 Acute respiratory distress: Secondary | ICD-10-CM | POA: Diagnosis not present

## 2016-06-19 DIAGNOSIS — R911 Solitary pulmonary nodule: Secondary | ICD-10-CM | POA: Diagnosis not present

## 2016-06-19 DIAGNOSIS — Z87891 Personal history of nicotine dependence: Secondary | ICD-10-CM | POA: Insufficient documentation

## 2016-06-19 DIAGNOSIS — F17210 Nicotine dependence, cigarettes, uncomplicated: Secondary | ICD-10-CM | POA: Diagnosis not present

## 2016-06-19 DIAGNOSIS — Z122 Encounter for screening for malignant neoplasm of respiratory organs: Secondary | ICD-10-CM | POA: Diagnosis not present

## 2016-06-28 DIAGNOSIS — R918 Other nonspecific abnormal finding of lung field: Secondary | ICD-10-CM | POA: Diagnosis not present

## 2016-06-28 DIAGNOSIS — J449 Chronic obstructive pulmonary disease, unspecified: Secondary | ICD-10-CM | POA: Diagnosis not present

## 2016-06-28 DIAGNOSIS — E119 Type 2 diabetes mellitus without complications: Secondary | ICD-10-CM | POA: Diagnosis not present

## 2016-06-28 DIAGNOSIS — F17200 Nicotine dependence, unspecified, uncomplicated: Secondary | ICD-10-CM | POA: Diagnosis not present

## 2016-07-08 DIAGNOSIS — Z125 Encounter for screening for malignant neoplasm of prostate: Secondary | ICD-10-CM | POA: Diagnosis not present

## 2016-07-08 DIAGNOSIS — J438 Other emphysema: Secondary | ICD-10-CM | POA: Diagnosis not present

## 2016-07-08 DIAGNOSIS — E782 Mixed hyperlipidemia: Secondary | ICD-10-CM | POA: Diagnosis not present

## 2016-07-08 DIAGNOSIS — E119 Type 2 diabetes mellitus without complications: Secondary | ICD-10-CM | POA: Diagnosis not present

## 2016-07-08 DIAGNOSIS — I2510 Atherosclerotic heart disease of native coronary artery without angina pectoris: Secondary | ICD-10-CM | POA: Diagnosis not present

## 2016-07-08 DIAGNOSIS — I10 Essential (primary) hypertension: Secondary | ICD-10-CM | POA: Diagnosis not present

## 2016-07-12 DIAGNOSIS — R911 Solitary pulmonary nodule: Secondary | ICD-10-CM | POA: Diagnosis not present

## 2016-07-12 DIAGNOSIS — J984 Other disorders of lung: Secondary | ICD-10-CM | POA: Diagnosis not present

## 2016-07-12 DIAGNOSIS — R918 Other nonspecific abnormal finding of lung field: Secondary | ICD-10-CM | POA: Diagnosis not present

## 2016-07-12 DIAGNOSIS — Z87891 Personal history of nicotine dependence: Secondary | ICD-10-CM | POA: Diagnosis not present

## 2016-07-14 DIAGNOSIS — R0603 Acute respiratory distress: Secondary | ICD-10-CM | POA: Diagnosis not present

## 2016-07-14 DIAGNOSIS — J449 Chronic obstructive pulmonary disease, unspecified: Secondary | ICD-10-CM | POA: Diagnosis not present

## 2016-07-15 ENCOUNTER — Encounter: Payer: Self-pay | Admitting: Emergency Medicine

## 2016-07-15 ENCOUNTER — Emergency Department: Payer: PPO

## 2016-07-15 ENCOUNTER — Inpatient Hospital Stay
Admission: EM | Admit: 2016-07-15 | Discharge: 2016-07-17 | DRG: 871 | Disposition: A | Discharge status: Home / Self Care | Payer: PPO | Attending: Internal Medicine | Admitting: Internal Medicine

## 2016-07-15 DIAGNOSIS — R1110 Vomiting, unspecified: Secondary | ICD-10-CM | POA: Diagnosis not present

## 2016-07-15 DIAGNOSIS — J189 Pneumonia, unspecified organism: Secondary | ICD-10-CM | POA: Diagnosis not present

## 2016-07-15 DIAGNOSIS — J9621 Acute and chronic respiratory failure with hypoxia: Secondary | ICD-10-CM | POA: Diagnosis not present

## 2016-07-15 DIAGNOSIS — J440 Chronic obstructive pulmonary disease with acute lower respiratory infection: Secondary | ICD-10-CM | POA: Diagnosis present

## 2016-07-15 DIAGNOSIS — A419 Sepsis, unspecified organism: Principal | ICD-10-CM | POA: Diagnosis present

## 2016-07-15 DIAGNOSIS — Z7984 Long term (current) use of oral hypoglycemic drugs: Secondary | ICD-10-CM | POA: Diagnosis not present

## 2016-07-15 DIAGNOSIS — J690 Pneumonitis due to inhalation of food and vomit: Secondary | ICD-10-CM | POA: Diagnosis not present

## 2016-07-15 DIAGNOSIS — E119 Type 2 diabetes mellitus without complications: Secondary | ICD-10-CM | POA: Diagnosis present

## 2016-07-15 DIAGNOSIS — I10 Essential (primary) hypertension: Secondary | ICD-10-CM | POA: Diagnosis present

## 2016-07-15 DIAGNOSIS — Z9981 Dependence on supplemental oxygen: Secondary | ICD-10-CM

## 2016-07-15 DIAGNOSIS — Z7982 Long term (current) use of aspirin: Secondary | ICD-10-CM

## 2016-07-15 DIAGNOSIS — R1013 Epigastric pain: Secondary | ICD-10-CM | POA: Diagnosis present

## 2016-07-15 DIAGNOSIS — J9601 Acute respiratory failure with hypoxia: Secondary | ICD-10-CM | POA: Diagnosis not present

## 2016-07-15 DIAGNOSIS — J441 Chronic obstructive pulmonary disease with (acute) exacerbation: Secondary | ICD-10-CM | POA: Diagnosis not present

## 2016-07-15 DIAGNOSIS — Z7951 Long term (current) use of inhaled steroids: Secondary | ICD-10-CM | POA: Diagnosis not present

## 2016-07-15 DIAGNOSIS — K920 Hematemesis: Secondary | ICD-10-CM | POA: Diagnosis present

## 2016-07-15 DIAGNOSIS — Z79899 Other long term (current) drug therapy: Secondary | ICD-10-CM

## 2016-07-15 DIAGNOSIS — R109 Unspecified abdominal pain: Secondary | ICD-10-CM | POA: Diagnosis not present

## 2016-07-15 DIAGNOSIS — F17210 Nicotine dependence, cigarettes, uncomplicated: Secondary | ICD-10-CM | POA: Diagnosis not present

## 2016-07-15 DIAGNOSIS — I2510 Atherosclerotic heart disease of native coronary artery without angina pectoris: Secondary | ICD-10-CM | POA: Diagnosis not present

## 2016-07-15 DIAGNOSIS — J449 Chronic obstructive pulmonary disease, unspecified: Secondary | ICD-10-CM | POA: Diagnosis not present

## 2016-07-15 DIAGNOSIS — R0602 Shortness of breath: Secondary | ICD-10-CM | POA: Diagnosis not present

## 2016-07-15 DIAGNOSIS — R112 Nausea with vomiting, unspecified: Secondary | ICD-10-CM | POA: Diagnosis not present

## 2016-07-15 LAB — COMPREHENSIVE METABOLIC PANEL
ALT: 17 U/L (ref 17–63)
ANION GAP: 9 (ref 5–15)
AST: 22 U/L (ref 15–41)
Albumin: 4 g/dL (ref 3.5–5.0)
Alkaline Phosphatase: 65 U/L (ref 38–126)
BUN: 20 mg/dL (ref 6–20)
CALCIUM: 9 mg/dL (ref 8.9–10.3)
CHLORIDE: 100 mmol/L — AB (ref 101–111)
CO2: 24 mmol/L (ref 22–32)
Creatinine, Ser: 0.76 mg/dL (ref 0.61–1.24)
GFR calc non Af Amer: >60 mL/min (ref >60)
Glucose, Bld: 150 mg/dL — ABNORMAL HIGH (ref 65–99)
POTASSIUM: 3.9 mmol/L (ref 3.5–5.1)
SODIUM: 133 mmol/L — AB (ref 135–145)
Total Bilirubin: 0.7 mg/dL (ref 0.3–1.2)
Total Protein: 7.1 g/dL (ref 6.5–8.1)

## 2016-07-15 LAB — URINALYSIS, COMPLETE (UACMP) WITH MICROSCOPIC
Bacteria, UA: NONE SEEN
Bilirubin Urine: NEGATIVE
Ketones, ur: NEGATIVE mg/dL
LEUKOCYTES UA: NEGATIVE
NITRITE: NEGATIVE
PROTEIN: NEGATIVE mg/dL
Specific Gravity, Urine: 1.008 (ref 1.005–1.030)
Squamous Epithelial / LPF: NONE SEEN
pH: 5 (ref 5.0–8.0)

## 2016-07-15 LAB — CBC WITH DIFFERENTIAL/PLATELET
BASOS PCT: 1 %
Basophils Absolute: 0.2 10*3/uL — ABNORMAL HIGH (ref 0–0.1)
EOS ABS: 0.1 10*3/uL (ref 0–0.7)
EOS PCT: 1 %
HCT: 39.3 % — ABNORMAL LOW (ref 40.0–52.0)
Hemoglobin: 13.1 g/dL (ref 13.0–18.0)
LYMPHS ABS: 0.8 10*3/uL — AB (ref 1.0–3.6)
LYMPHS PCT: 4 %
MCH: 27.1 pg (ref 26.0–34.0)
MCHC: 33.4 g/dL (ref 32.0–36.0)
MCV: 81 fL (ref 80.0–100.0)
MONOS PCT: 6 %
Monocytes Absolute: 1.5 10*3/uL — ABNORMAL HIGH (ref 0.2–1.0)
NEUTROS ABS: 20.9 10*3/uL — AB (ref 1.4–6.5)
NEUTROS PCT: 88 %
PLATELETS: 355 10*3/uL (ref 150–440)
RBC: 4.85 MIL/uL (ref 4.40–5.90)
RDW: 16.4 % — AB (ref 11.5–14.5)
WBC: 23.6 10*3/uL — ABNORMAL HIGH (ref 3.8–10.6)

## 2016-07-15 LAB — HEMOGLOBIN: HEMOGLOBIN: 11.9 g/dL — AB (ref 13.0–18.0)

## 2016-07-15 LAB — LACTIC ACID, PLASMA
Lactic Acid, Venous: 2.2 mmol/L (ref 0.5–1.9)
Lactic Acid, Venous: 2.7 mmol/L (ref 0.5–1.9)

## 2016-07-15 LAB — GLUCOSE, CAPILLARY
Glucose-Capillary: 166 mg/dL — ABNORMAL HIGH (ref 65–99)
Glucose-Capillary: 276 mg/dL — ABNORMAL HIGH (ref 65–99)

## 2016-07-15 LAB — TROPONIN I

## 2016-07-15 LAB — LIPASE, BLOOD: Lipase: 22 U/L (ref 11–51)

## 2016-07-15 MED ORDER — SIMVASTATIN 20 MG PO TABS
20.0000 mg | ORAL_TABLET | Freq: Every day | ORAL | Status: DC
  Administered 2016-07-15 – 2016-07-16 (×2): 20 mg via ORAL
  Filled 2016-07-15 (×2): qty 1

## 2016-07-15 MED ORDER — ENALAPRIL MALEATE 10 MG PO TABS
10.0000 mg | ORAL_TABLET | Freq: Every day | ORAL | Status: DC
  Administered 2016-07-16 – 2016-07-17 (×2): 10 mg via ORAL
  Filled 2016-07-15 (×3): qty 1

## 2016-07-15 MED ORDER — METOPROLOL TARTRATE 50 MG PO TABS: 50.0000 mg | ORAL_TABLET | Freq: Two times a day (BID) | ORAL | Status: DC

## 2016-07-15 MED ORDER — LEVOFLOXACIN IN D5W 500 MG/100ML IV SOLN
500.0000 mg | INTRAVENOUS | Status: DC
  Administered 2016-07-16: 500 mg via INTRAVENOUS
  Filled 2016-07-15 (×2): qty 100

## 2016-07-15 MED ORDER — LEVOFLOXACIN IN D5W 500 MG/100ML IV SOLN: 500.0000 mg | INTRAVENOUS | Status: DC

## 2016-07-15 MED ORDER — INSULIN ASPART 100 UNIT/ML ~~LOC~~ SOLN
0.0000 [IU] | Freq: Three times a day (TID) | SUBCUTANEOUS | Status: DC
  Administered 2016-07-15: 3 [IU] via SUBCUTANEOUS
  Administered 2016-07-16: 2 [IU] via SUBCUTANEOUS
  Administered 2016-07-16: 0 [IU] via SUBCUTANEOUS
  Administered 2016-07-16 – 2016-07-17 (×2): 2 [IU] via SUBCUTANEOUS
  Filled 2016-07-15: qty 3
  Filled 2016-07-15 (×3): qty 2

## 2016-07-15 MED ORDER — SODIUM CHLORIDE 0.9 % IV BOLUS (SEPSIS): 1000.0000 mL | Freq: Once | INTRAVENOUS | Status: DC

## 2016-07-15 MED ORDER — CLINDAMYCIN HCL 150 MG PO CAPS
300.0000 mg | ORAL_CAPSULE | Freq: Three times a day (TID) | ORAL | Status: DC
  Administered 2016-07-15 – 2016-07-17 (×6): 300 mg via ORAL
  Filled 2016-07-15 (×6): qty 2

## 2016-07-15 MED ORDER — MOMETASONE FURO-FORMOTEROL FUM 200-5 MCG/ACT IN AERO
2.0000 | INHALATION_SPRAY | Freq: Two times a day (BID) | RESPIRATORY_TRACT | Status: DC
  Administered 2016-07-15 – 2016-07-17 (×4): 2 via RESPIRATORY_TRACT
  Filled 2016-07-15: qty 8.8

## 2016-07-15 MED ORDER — SODIUM CHLORIDE 0.9 % IV BOLUS (SEPSIS)
1000.0000 mL | Freq: Once | INTRAVENOUS | Status: AC
  Administered 2016-07-15: 1000 mL via INTRAVENOUS

## 2016-07-15 MED ORDER — METHYLPREDNISOLONE SODIUM SUCC 125 MG IJ SOLR
125.0000 mg | Freq: Once | INTRAMUSCULAR | Status: AC
  Administered 2016-07-15: 125 mg via INTRAVENOUS
  Filled 2016-07-15: qty 2

## 2016-07-15 MED ORDER — PANTOPRAZOLE SODIUM 40 MG IV SOLR: 40.0000 mg | Freq: Two times a day (BID) | INTRAVENOUS | Status: DC

## 2016-07-15 MED ORDER — INSULIN ASPART 100 UNIT/ML ~~LOC~~ SOLN
0.0000 [IU] | Freq: Every day | SUBCUTANEOUS | Status: DC
  Administered 2016-07-15: 3 [IU] via SUBCUTANEOUS
  Filled 2016-07-15: qty 3

## 2016-07-15 MED ORDER — AZITHROMYCIN 500 MG PO TABS
500.0000 mg | ORAL_TABLET | Freq: Once | ORAL | Status: AC
  Administered 2016-07-15: 500 mg via ORAL
  Filled 2016-07-15: qty 1

## 2016-07-15 MED ORDER — CEFTRIAXONE SODIUM IN DEXTROSE 20 MG/ML IV SOLN
1.0000 g | Freq: Once | INTRAVENOUS | Status: DC
  Administered 2016-07-15: 1 g via INTRAVENOUS
  Filled 2016-07-15: qty 50

## 2016-07-15 MED ORDER — METFORMIN HCL 500 MG PO TABS
500.0000 mg | ORAL_TABLET | Freq: Two times a day (BID) | ORAL | Status: DC
  Administered 2016-07-16 – 2016-07-17 (×3): 500 mg via ORAL
  Filled 2016-07-15 (×4): qty 1

## 2016-07-15 MED ORDER — POTASSIUM CHLORIDE IN NACL 20-0.9 MEQ/L-% IV SOLN
INTRAVENOUS | Status: DC
  Administered 2016-07-15 – 2016-07-17 (×3): via INTRAVENOUS
  Filled 2016-07-15 (×6): qty 1000

## 2016-07-15 MED ORDER — ONDANSETRON HCL 4 MG PO TABS: 4.0000 mg | ORAL_TABLET | Freq: Four times a day (QID) | ORAL | Status: DC | PRN

## 2016-07-15 MED ORDER — ACETAMINOPHEN 325 MG PO TABS: 650.0000 mg | ORAL_TABLET | Freq: Four times a day (QID) | ORAL | Status: DC | PRN

## 2016-07-15 MED ORDER — IPRATROPIUM-ALBUTEROL 0.5-2.5 (3) MG/3ML IN SOLN
3.0000 mL | Freq: Once | RESPIRATORY_TRACT | Status: AC
  Administered 2016-07-15: 3 mL via RESPIRATORY_TRACT
  Filled 2016-07-15: qty 3

## 2016-07-15 MED ORDER — PANTOPRAZOLE SODIUM 40 MG IV SOLR
40.0000 mg | Freq: Two times a day (BID) | INTRAVENOUS | Status: DC
  Administered 2016-07-15 – 2016-07-16 (×2): 40 mg via INTRAVENOUS
  Filled 2016-07-15 (×2): qty 40

## 2016-07-15 MED ORDER — ACETAMINOPHEN 650 MG RE SUPP: 650.0000 mg | Freq: Four times a day (QID) | RECTAL | Status: DC | PRN

## 2016-07-15 MED ORDER — METOPROLOL TARTRATE 50 MG PO TABS
50.0000 mg | ORAL_TABLET | Freq: Two times a day (BID) | ORAL | Status: DC
  Administered 2016-07-15 – 2016-07-17 (×4): 50 mg via ORAL
  Filled 2016-07-15 (×5): qty 1

## 2016-07-15 MED ORDER — ONDANSETRON HCL 4 MG/2ML IJ SOLN: 4.0000 mg | Freq: Four times a day (QID) | INTRAMUSCULAR | Status: DC | PRN

## 2016-07-15 MED ORDER — ALBUTEROL SULFATE (2.5 MG/3ML) 0.083% IN NEBU
2.5000 mg | INHALATION_SOLUTION | RESPIRATORY_TRACT | Status: DC | PRN
  Administered 2016-07-15 – 2016-07-17 (×3): 2.5 mg via RESPIRATORY_TRACT
  Filled 2016-07-15 (×3): qty 3

## 2016-07-15 MED ORDER — ENOXAPARIN SODIUM 40 MG/0.4ML ~~LOC~~ SOLN
40.0000 mg | SUBCUTANEOUS | Status: DC
Start: 2016-07-15 — End: 2016-07-15

## 2016-07-15 MED ORDER — PREDNISONE 20 MG PO TABS
50.0000 mg | ORAL_TABLET | Freq: Every day | ORAL | Status: DC
  Administered 2016-07-16 – 2016-07-17 (×2): 50 mg via ORAL
  Filled 2016-07-15 (×3): qty 1

## 2016-07-15 MED ORDER — GUAIFENESIN-DM 100-10 MG/5ML PO SYRP: 10.0000 mL | ORAL_SOLUTION | Freq: Four times a day (QID) | ORAL | Status: DC | PRN

## 2016-07-15 MED ORDER — POLYETHYLENE GLYCOL 3350 17 G PO PACK: 17.0000 g | PACK | Freq: Every day | ORAL | Status: DC | PRN

## 2016-07-15 MED ORDER — IOPAMIDOL (ISOVUE-300) INJECTION 61%
100.0000 mL | Freq: Once | INTRAVENOUS | Status: AC | PRN
  Administered 2016-07-15: 100 mL via INTRAVENOUS

## 2016-07-15 NOTE — H&P (Signed)
[place] [facility] [place] NAME: Bradley [name]    MR#:  [id number]  DATE OF BIRTH:  Jul 18, 1950  DATE OF ADMISSION:  07/15/2016  PRIMARY CARE PHYSICIAN: Valera Castle, MD   REQUESTING/REFERRING PHYSICIAN: Dr. Alfred Levins  CHIEF COMPLAINT:   Chief Complaint  Patient presents with  . Shortness of Breath  . Hematemesis    HISTORY OF PRESENT ILLNESS:  Bradley [name]  is a 66 y.o. male with a known history of COPD on oxygen 2 recently, hypertension, diabetes presents to the emergency room complaining of worsening shortness of breath and vomiting since today morning. He had 7-8 episodes of vomiting with streaks of blood in the end. Also complains of epigastric abdominal pain. He had a pint of alcohol yesterday. Does not drink alcohol regularly. Later he noticed he had worsening shortness of breath and cough. He stopped using oxygen 6 weeks back which he needed again at home and used his nebulizer machine. On EMS arriving at his home his saturations were found to be low and brought to the emergency room. Here patient initially had a chest x-ray which showed some atelectasis. A CT scan of the abdomen and pelvis was checked for his abdominal pain and vomiting. Abdomen did not show anything acute but there was right lower lobe infiltrate on the CT scan.  PAST MEDICAL HISTORY:   Past Medical History:  Diagnosis Date  . COPD (chronic obstructive pulmonary disease) ([place])   . Coronary artery disease   . Diabetes mellitus without complication ([place])   . Hypertension     PAST SURGICAL HISTORY:   Past Surgical History:  Procedure Laterality Date  . LUNG BIOPSY Right    2006  . right side partial lobe removed       SOCIAL HISTORY:   Social History  Substance Use Topics  . Smoking status: Current Every Day Smoker    Packs/day: 0.50  . Smokeless tobacco: Never Used  . Alcohol use No    FAMILY HISTORY:   Family History  Problem Relation Age of Onset   . Cancer Mother   . Heart attack Father   . Cancer Sister     DRUG ALLERGIES:  No Known Allergies  REVIEW OF SYSTEMS:   Review of Systems  Constitutional: Positive for malaise/fatigue. Negative for chills, fever and weight loss.  HENT: Negative for hearing loss and nosebleeds.   Eyes: Negative for blurred vision, double vision and pain.  Respiratory: Positive for cough, sputum production, shortness of breath and wheezing. Negative for hemoptysis.   Cardiovascular: Negative for chest pain, palpitations, orthopnea and leg swelling.  Gastrointestinal: Positive for abdominal pain, nausea and vomiting. Negative for constipation and diarrhea.  Genitourinary: Negative for dysuria and hematuria.  Musculoskeletal: Negative for back pain, falls and myalgias.  Skin: Negative for rash.  Neurological: Positive for weakness. Negative for dizziness, tremors, sensory change, speech change, focal weakness, seizures and headaches.  Endo/Heme/Allergies: Does not bruise/bleed easily.  Psychiatric/Behavioral: Negative for depression and memory loss. The patient is not nervous/anxious.     MEDICATIONS AT HOME:   Prior to Admission medications   Medication Sig Start Date End Date Taking? Authorizing Provider  albuterol (PROVENTIL) (2.5 MG/3ML) 0.083% nebulizer solution Take 2.5 mg by nebulization every 6 (six) hours as needed for wheezing or shortness of breath.   Yes [provider]  aspirin EC 81 MG tablet Take 81 mg by mouth daily.   Yes [provider]  clopidogrel (PLAVIX) 75 MG  tablet Take 75 mg by mouth daily.   Yes [provider]  enalapril (VASOTEC) 10 MG tablet Take 10 mg by mouth daily.   Yes [provider]  Fluticasone-Salmeterol (ADVAIR) 250-50 MCG/DOSE AEPB Inhale 1 puff into the lungs 2 (two) times daily. 09/20/15  Yes Hillary Bow, MD  metFORMIN (GLUCOPHAGE) 500 MG tablet Take 1 tablet (500 mg total) by mouth 2 (two) times daily with a meal. Takes  every morning and then takes another one in the evening if his "sugar starts acting up" Patient taking differently: Take 500 mg by mouth daily with breakfast.  12/30/15  Yes Fritzi Mandes, MD  metoprolol (LOPRESSOR) 50 MG tablet Take 50 mg by mouth 2 (two) times daily.   Yes [provider]  nitroGLYCERIN (NITROSTAT) 0.4 MG SL tablet Place 0.4 mg under the tongue every 5 (five) minutes as needed for chest pain.   Yes [provider]  PROAIR HFA 108 (90 Base) MCG/ACT inhaler Inhale 2 puffs into the lungs every 6 (six) hours as needed for wheezing or shortness of breath. 07/02/16  Yes [provider]  simvastatin (ZOCOR) 20 MG tablet Take 20 mg by mouth at bedtime.    Yes [provider]  azithromycin (ZITHROMAX) 250 MG tablet Take as directed Patient not taking: Reported on 01/12/2016 12/30/15   Fritzi Mandes, MD  cefUROXime (CEFTIN) 500 MG tablet Take 1 tablet (500 mg total) by mouth 2 (two) times daily with a meal. Patient not taking: Reported on 01/12/2016 12/30/15   Fritzi Mandes, MD  guaiFENesin-dextromethorphan (ROBITUSSIN DM) 100-10 MG/5ML syrup Take 10 mLs by mouth every 6 (six) hours as needed for cough. Patient not taking: Reported on 07/15/2016 12/30/15   Fritzi Mandes, MD  mirtazapine (REMERON) 15 MG tablet Take 1 tablet (15 mg total) by mouth at bedtime. Patient not taking: Reported on 07/15/2016 04/16/15   Dustin Flock, MD  predniSONE (DELTASONE) 20 MG tablet Take 3 tablets (60 mg total) by mouth daily. Patient not taking: Reported on 01/12/2016 12/22/15   Hinda Kehr, MD  tiotropium [facility]) 18 MCG inhalation capsule Place 1 capsule (18 mcg total) into inhaler and inhale daily. Patient not taking: Reported on 07/15/2016 09/20/15   Hillary Bow, MD     VITAL SIGNS:  Blood pressure 120/60, pulse (!) 121, temperature 98.6 F (37 C), temperature source Oral, resp. rate (!) 22, height '5\' 7"'$  (1.702 m), weight 64 kg (141 lb), SpO2 95 %.  PHYSICAL  EXAMINATION:  Physical Exam  GENERAL:  66 y.o.-year-old patient lying in the bed with no acute distress.  EYES: Pupils equal, round, reactive to light and accommodation. No scleral icterus. Extraocular muscles intact.  HEENT: Head atraumatic, normocephalic. Oropharynx and nasopharynx clear. No oropharyngeal erythema, moist oral mucosa  NECK:  Supple, no jugular venous distention. No thyroid enlargement, no tenderness.  LUNGS: Mild bilateral wheezing with decreased air entry at the right base. CARDIOVASCULAR: S1, S2 normal. No murmurs, rubs, or gallops.  ABDOMEN: Soft, nondistended. Bowel sounds present. No organomegaly or mass. Epigastric tenderness  EXTREMITIES: No pedal edema, cyanosis, or clubbing. + 2 pedal & radial pulses b/l.   NEUROLOGIC: Cranial nerves II through XII are intact. No focal Motor or sensory deficits appreciated b/l PSYCHIATRIC: The patient is alert and oriented x 3. Good affect.  SKIN: No obvious rash, lesion, or ulcer.   LABORATORY PANEL:   CBC  Recent Labs Lab 07/15/16 1254  WBC 23.6*  HGB 13.1  HCT 39.3*  PLT 355   ------------------------------------------------------------------------------------------------------------------  Chemistries   Recent Labs Lab 07/15/16 1254  NA 133*  K 3.9  CL 100*  CO2 24  GLUCOSE 150*  BUN 20  CREATININE 0.76  CALCIUM 9.0  AST 22  ALT 17  ALKPHOS 65  BILITOT 0.7   ------------------------------------------------------------------------------------------------------------------  Cardiac Enzymes  Recent Labs Lab 07/15/16 1254  TROPONINI <0.03   ------------------------------------------------------------------------------------------------------------------  RADIOLOGY:  Dg Chest 2 View  Result Date: 07/15/2016 CLINICAL DATA:  Hematemesis.  Abdominal pain . EXAM: CHEST  2 VIEW COMPARISON:  12/27/2015 . FINDINGS: Mediastinum and hilar structures normal. Heart size normal. Increased basilar interstitial  markings noted. Findings suggest pneumonitis. Underlying chronic interstitial changes are also present. Bilateral pleural-parenchymal thickening noted consistent with scarring. COPD. No pneumothorax. IMPRESSION: 1. Increased basilar interstitial prominence consistent with pneumonitis. 2. Chronic interstitial lung disease and bilateral pleural-parenchymal scarring. COPD. Electronically Signed   By: Marcello Moores  Register   On: 07/15/2016 13:49   Ct Abdomen Pelvis W Contrast  Result Date: 07/15/2016 CLINICAL DATA:  Hematemesis, upper abdominal pain. EXAM: CT ABDOMEN AND PELVIS WITH CONTRAST TECHNIQUE: Multidetector CT imaging of the abdomen and pelvis was performed using the standard protocol following bolus administration of intravenous contrast. CONTRAST:  142m ISOVUE-300 IOPAMIDOL (ISOVUE-300) INJECTION 61% COMPARISON:  CT scan of May 03, 2014. FINDINGS: Lower chest: Emphysematous disease is noted in both lung bases. Pneumonia or atelectasis is noted posteriorly in the right lower lobe. Hepatobiliary: No focal liver abnormality is seen. No gallstones, gallbladder wall thickening, or biliary dilatation. Pancreas: Unremarkable. No pancreatic ductal dilatation or surrounding inflammatory changes. Spleen: Normal in size without focal abnormality. Adrenals/Urinary Tract: Adrenal glands are unremarkable. Left renal cysts are noted. No hydronephrosis or renal obstruction is noted. No renal or ureteral calculi are noted. Urinary bladder is unremarkable. Stomach/Bowel: There is no evidence of bowel obstruction. No inflammatory changes are noted. The appendix is not clearly visualized. Vascular/Lymphatic: Aortic atherosclerosis. No enlarged abdominal or pelvic lymph nodes. Reproductive: Prostate is unremarkable. Other: No abdominal wall hernia or abnormality. No abdominopelvic ascites. Musculoskeletal: No acute or significant osseous findings. IMPRESSION: Stable emphysematous changes are noted in both lung bases. Increased  right lung opacity is noted concerning for atelectasis or possibly pneumonia. Aortic atherosclerosis. No other definite abnormality seen in the abdomen or pelvis. Electronically Signed   By: JMarijo Conception M.D.   On: 07/15/2016 15:22     IMPRESSION AND PLAN:   * Acute hypoxic respiratory failure likely secondary to aspiration and right lower lobe pneumonitis and COPD exacerbation . Sepsis present on admission Start Levaquin and add clindamycin. Steroids. Nebulizers. Wean oxygen as tolerated. IV fluids Patient was on oxygen at home to 6 weeks back and may need it again at discharge.  * Vomiting and epigastric pain likely due to alcohol binge yesterday. He did see some streaks of blood could be Mallory-Weiss tear or gastritis. Hemoglobin is stable at 13.1. We'll repeat hemoglobin later today evening and tomorrow morning. If there is any further bleeding or significant drop in hemoglobin and will need GI to see the patient. We will start IV Protonix twice a day at this time.  * Diabetes mellitus. Sliding scale insulin. Home medications.  * Hypertension. Continue home medications.  * DVT prophylaxis with SCDs  All the records are reviewed and case discussed with ED provider. Management plans discussed with the patient, family and they are in agreement.  CODE STATUS: FULL CODE  TOTAL TIME TAKING CARE OF THIS PATIENT: 40 minutes.   SHillary BowR M.D on 07/15/2016  at 4:49 PM  Between 7am to 6pm - Pager - [phone]  After 6pm go to [email or web address] - password [email or web address] [place] [facility]  Office  [phone]  CC: Primary care physician; Valera Castle, MD  Note: This dictation was prepared with Dragon dictation along with smaller phrase technology. Any transcriptional errors that result from this process are unintentional.

## 2016-07-15 NOTE — Progress Notes (Signed)
Pt arrived via stretcher from the Ed at 1815, transferred self from stretcher to the bed. Pt is on 2LNC, lungs are clear with a fe w expiratory wheezes to lll, no sob, no pain. Pt is placed on telemetry per md order. Abdomen is soft, bs heard. Pt is given urinal for prn use.ppp, no edema noted. piv #20 intact to r fa and l fa, both sites are free of redness and swelling. This Probation officer reviewed orders with pt, pt stated he is very hungry, ordered dinner. Pt denied any bleeding except what was noted in his vomit earlier in the day. Pt stated he has quit smoking and feels much better since he did so. Pt is oriented to room and call bell. Pt is anticipating d/c tomorrow.

## 2016-07-15 NOTE — ED Notes (Signed)
Resumed care from [place] rn.  Pt alert.  Pt on 2 liters oxygen.  Pt reports abd pain.  Family with pt.   Pt talkative.

## 2016-07-15 NOTE — ED Triage Notes (Signed)
Pt arrived via EMS from home for reports of SOB and vomiting blood this morning. EMs reports room air SpO2 89%, 97% with duoneb and then 3L O2 via [place]. Pt reports vomiting bright red blood. Pt reports upper abdominal pain on arrival. EMS administered Zofran '4mg'$  IV art 1240.

## 2016-07-15 NOTE — ED Notes (Signed)
Report called to [employer].

## 2016-07-15 NOTE — Progress Notes (Signed)
Pt. Lactic acid is 2.7. Dr. Earleen Newport notified with no new orders.

## 2016-07-15 NOTE — ED Provider Notes (Signed)
[facility] Emergency Department Provider Note  ____________________________________________  Time seen: Approximately 12:59 PM  I have reviewed the triage vital signs and the nursing notes.   HISTORY  Chief Complaint Shortness of Breath and Hematemesis   HPI Bradley [name] is a 66 y.o. male with h/o COPD, CAD, DM, HTN who presents for evaluation of N/V and abdominal pain. Patient reports that he was in his usual state of health earlier this morning. A few hours prior to arrival patient started feeling lightheaded as if he was going to pass out. He started vomiting. He reports that the first episode was nonbloody nonbilious, followed by one episode with blood in it, 2 episodes with dark brown stuff and multiple episodes since with clear secretions. Patient also started to have chills and shortness of breath after the vomiting started. Also started to complain of epigastric abdominal pain, sharp, constant and non radiating that started after a few episodes of vomiting. Had one watery bowel movement earlier today with no melena or blood. Patient denies chest pain, fever. He has had a dry cough for a week. No dysuria or hematuria. Received one duoneb per EMS and reports that his SOB is markedly improved. Also received zofran per EMS. Patient reports that he stopped smoking several weeks ago and has taken himself off of oxygen 6 weeks ago and he was doing well without it. Found hypoxic per EMS.   Past Medical History:  Diagnosis Date  . COPD (chronic obstructive pulmonary disease) ([place])   . Coronary artery disease   . Diabetes mellitus without complication ([place])   . Hypertension     Patient Active Problem List   Diagnosis Date Noted  . Acute respiratory failure with hypoxia ([place]) 12/27/2015  . Community acquired pneumonia 12/27/2015  . Acute respiratory distress 09/19/2015  . Adjustment disorder with mixed anxiety and depressed mood 04/14/2015  . Insomnia  04/14/2015  . Grief 04/14/2015  . Hyponatremia 04/12/2015  . COPD exacerbation ([place]) 10/08/2014  . CAP (community acquired pneumonia) 10/08/2014  . Hypogammaglobulinemia ([place]) 08/17/2014  . Leukocytosis 08/03/2014  . Recurrent infections 08/03/2014    Past Surgical History:  Procedure Laterality Date  . LUNG BIOPSY Right    2006  . right side partial lobe removed       Prior to Admission medications   Medication Sig Start Date End Date Taking? Authorizing Provider  albuterol (PROVENTIL) (2.5 MG/3ML) 0.083% nebulizer solution Take 2.5 mg by nebulization every 6 (six) hours as needed for wheezing or shortness of breath.   Yes [provider]  aspirin EC 81 MG tablet Take 81 mg by mouth daily.   Yes [provider]  clopidogrel (PLAVIX) 75 MG tablet Take 75 mg by mouth daily.   Yes [provider]  enalapril (VASOTEC) 10 MG tablet Take 10 mg by mouth daily.   Yes [provider]  Fluticasone-Salmeterol (ADVAIR) 250-50 MCG/DOSE AEPB Inhale 1 puff into the lungs 2 (two) times daily. 09/20/15  Yes Hillary Bow, MD  metFORMIN (GLUCOPHAGE) 500 MG tablet Take 1 tablet (500 mg total) by mouth 2 (two) times daily with a meal. Takes every morning and then takes another one in the evening if his "sugar starts acting up" Patient taking differently: Take 500 mg by mouth daily with breakfast.  12/30/15  Yes Fritzi Mandes, MD  metoprolol (LOPRESSOR) 50 MG tablet Take 50 mg by mouth 2 (two) times daily.   Yes [provider]  nitroGLYCERIN (NITROSTAT) 0.4 MG SL  tablet Place 0.4 mg under the tongue every 5 (five) minutes as needed for chest pain.   Yes [provider]  PROAIR HFA 108 (90 Base) MCG/ACT inhaler Inhale 2 puffs into the lungs every 6 (six) hours as needed for wheezing or shortness of breath. 07/02/16  Yes [provider]  simvastatin (ZOCOR) 20 MG tablet Take 20 mg by mouth at bedtime.    Yes [provider]  azithromycin  (ZITHROMAX) 250 MG tablet Take as directed Patient not taking: Reported on 01/12/2016 12/30/15   Fritzi Mandes, MD  cefUROXime (CEFTIN) 500 MG tablet Take 1 tablet (500 mg total) by mouth 2 (two) times daily with a meal. Patient not taking: Reported on 01/12/2016 12/30/15   Fritzi Mandes, MD  guaiFENesin-dextromethorphan (ROBITUSSIN DM) 100-10 MG/5ML syrup Take 10 mLs by mouth every 6 (six) hours as needed for cough. Patient not taking: Reported on 07/15/2016 12/30/15   Fritzi Mandes, MD  mirtazapine (REMERON) 15 MG tablet Take 1 tablet (15 mg total) by mouth at bedtime. Patient not taking: Reported on 07/15/2016 04/16/15   Dustin Flock, MD  predniSONE (DELTASONE) 20 MG tablet Take 3 tablets (60 mg total) by mouth daily. Patient not taking: Reported on 01/12/2016 12/22/15   Hinda Kehr, MD  tiotropium [facility]) 18 MCG inhalation capsule Place 1 capsule (18 mcg total) into inhaler and inhale daily. Patient not taking: Reported on 07/15/2016 09/20/15   Hillary Bow, MD    Allergies Patient has no known allergies.  Family History  Problem Relation Age of Onset  . Cancer Mother   . Heart attack Father   . Cancer Sister     Social History Social History  Substance Use Topics  . Smoking status: Current Every Day Smoker    Packs/day: 0.50  . Smokeless tobacco: Never Used  . Alcohol use No    Review of Systems  Constitutional: Negative for fever. + chills, dizziness Eyes: Negative for visual changes. ENT: Negative for sore throat. Neck: No neck pain  Cardiovascular: Negative for chest pain. Respiratory: + shortness of breath, cough Gastrointestinal: + epigastric abdominal pain, vomiting and diarrhea. Genitourinary: Negative for dysuria. Musculoskeletal: Negative for back pain. Skin: Negative for rash. Neurological: Negative for headaches, weakness or numbness. Psych: No SI or HI  ____________________________________________   PHYSICAL EXAM:  VITAL SIGNS: ED Triage Vitals    Enc Vitals Group     BP --      Pulse Rate 07/15/16 1249 (!) 106     Resp --      Temp -- 98.70F     Temp src --      SpO2 07/15/16 1249 (!) 87 %     Weight 07/15/16 1249 141 lb (64 kg)     Height 07/15/16 1249 '5\' 7"'$  (1.702 m)     Head Circumference --      Peak Flow --      Pain Score 07/15/16 1248 7     Pain Loc --      Pain Edu? --      Excl. in [place]? --     Constitutional: Alert and oriented. Well appearing and in no apparent distress. HEENT:      Head: Normocephalic and atraumatic.         Eyes: Conjunctivae are normal. Sclera is non-icteric. EOMI. PERRL      Mouth/Throat: Mucous membranes are moist.       Neck: Supple with no signs of meningismus. Cardiovascular: Tachycardic with regular rhythm. No murmurs, gallops, or  rubs. 2+ symmetrical distal pulses are present in all extremities. No JVD. Respiratory: Normal respiratory effort, hypoxic on RA. Lungs are clear to auscultation bilaterally. No wheezes, crackles, or rhonchi.  Gastrointestinal: Soft, tender to palpation on the epigastric and RUQ regions, non distended with positive bowel sounds. No rebound or guarding. Musculoskeletal: Nontender with normal range of motion in all extremities. No edema, cyanosis, or erythema of extremities. Neurologic: Normal speech and language. Face is symmetric. Moving all extremities. No gross focal neurologic deficits are appreciated. Skin: Skin is warm, dry and intact. No rash noted. Psychiatric: Mood and affect are normal. Speech and behavior are normal.  ____________________________________________   LABS (all labs ordered are listed, but only abnormal results are displayed)  Labs Reviewed  CBC WITH DIFFERENTIAL/PLATELET - Abnormal; Notable for the following:       Result Value   WBC 23.6 (*)    HCT 39.3 (*)    RDW 16.4 (*)    Neutro Abs 20.9 (*)    Lymphs Abs 0.8 (*)    Monocytes Absolute 1.5 (*)    Basophils Absolute 0.2 (*)    All other components within normal limits   COMPREHENSIVE METABOLIC PANEL - Abnormal; Notable for the following:    Sodium 133 (*)    Chloride 100 (*)    Glucose, Bld 150 (*)    All other components within normal limits  CULTURE, BLOOD (ROUTINE X 2)  CULTURE, BLOOD (ROUTINE X 2)  LIPASE, BLOOD  TROPONIN I  URINALYSIS, COMPLETE (UACMP) WITH MICROSCOPIC  LACTIC ACID, PLASMA  LACTIC ACID, PLASMA   ____________________________________________  EKG  ED ECG REPORT I, Rudene Re, the attending physician, personally viewed and interpreted this ECG.  Sinus tachycardia, rate of 105, right bundle branch block, normal QTC, normal axis, ST depressions in anterior and septal leads, no ST elevations. These findings are similar to EKG from October 2017 ____________________________________________  RADIOLOGY  CXR: . Increased basilar interstitial prominence consistent with pneumonitis. 2. Chronic interstitial lung disease and bilateral pleural-parenchymal scarring. COPD.  CT a/p:  Stable emphysematous changes are noted in both lung bases. Increased right lung opacity is noted concerning for atelectasis or possibly pneumonia. Aortic atherosclerosis. No other definite abnormality seen in the abdomen or pelvis. ____________________________________________   PROCEDURES  Procedure(s) performed: None Procedures Critical Care performed: yes  CRITICAL CARE Performed by: Rudene Re  ?  Total critical care time: 35 min  Critical care time was exclusive of separately billable procedures and treating other patients.  Critical care was necessary to treat or prevent imminent or life-threatening deterioration.  Critical care was time spent personally by me on the following activities: development of treatment plan with patient and/or surrogate as well as nursing, discussions with consultants, evaluation of patient's response to treatment, examination of patient, obtaining history from patient or surrogate, ordering and  performing treatments and interventions, ordering and review of laboratory studies, ordering and review of radiographic studies, pulse oximetry and re-evaluation of patient's condition.  ____________________________________________   INITIAL IMPRESSION / ASSESSMENT AND PLAN / ED COURSE   66 y.o. male with h/o COPD, CAD, DM, HTN who presents for evaluation of lightheadedness, multiple episodes of emesis, nausea, one episode of diarrhea, and now epigastric/right upper quadrant abdominal pain. Patient is hypoxic on room air and had wheezing per EMS. No wheezing at this time. Continues to have oxygen requirement. We'll give DuoNeb treatments for COPD exacerbation in the setting of viral process vs possible aspiration from vomiting. Patient with significant ttp over the  epigastric/right upper quadrant. We'll check CMP and lipase. We'll do a right upper quadrant ultrasound to rule out gallbladder etiology. Patient received Zofran per EMS and is no longer vomiting. We'll monitor with telemetry. Will give IVF    _________________________ 3:47 PM on 07/15/2016 -----------------------------------------  CT abdomen and pelvis without any intra-abdominal pathology. Patient found to have pneumonia, remains tachycardic, afebrile, with leukocytosis, meets criteria for sepsis. No recent admission over the course of the last 3 months. Patient was given Rocephin and azithromycin. Lactic acid and blood cultures are pending. Patient given fluids, Solu-Medrol, and do an abs. We'll admit to the hospitalist.  Pertinent labs & imaging results that were available during my care of the patient were reviewed by me and considered in my medical decision making (see chart for details).    ____________________________________________   FINAL CLINICAL IMPRESSION(S) / ED DIAGNOSES  Final diagnoses:  Sepsis, due to unspecified organism [facility])  Community acquired pneumonia, unspecified laterality  COPD exacerbation ([place])    Acute respiratory failure with hypoxia ([place])      NEW MEDICATIONS STARTED DURING THIS VISIT:  New Prescriptions   No medications on file     Note:  This document was prepared using Dragon voice recognition software and may include unintentional dictation errors.    Rudene Re, MD 07/15/16 [phone]

## 2016-07-16 LAB — BASIC METABOLIC PANEL
ANION GAP: 8 (ref 5–15)
BUN: 12 mg/dL (ref 6–20)
CHLORIDE: 103 mmol/L (ref 101–111)
CO2: 25 mmol/L (ref 22–32)
CREATININE: 0.61 mg/dL (ref 0.61–1.24)
Calcium: 8.9 mg/dL (ref 8.9–10.3)
GFR calc non Af Amer: >60 mL/min (ref >60)
Glucose, Bld: 187 mg/dL — ABNORMAL HIGH (ref 65–99)
Potassium: 4.4 mmol/L (ref 3.5–5.1)
Sodium: 136 mmol/L (ref 135–145)

## 2016-07-16 LAB — GLUCOSE, CAPILLARY
GLUCOSE-CAPILLARY: 143 mg/dL — AB (ref 65–99)
GLUCOSE-CAPILLARY: 117 mg/dL — AB (ref 65–99)
GLUCOSE-CAPILLARY: 153 mg/dL — AB (ref 65–99)
Glucose-Capillary: 171 mg/dL — ABNORMAL HIGH (ref 65–99)

## 2016-07-16 LAB — CBC
HCT: 35.1 % — ABNORMAL LOW (ref 40.0–52.0)
HEMOGLOBIN: 12 g/dL — AB (ref 13.0–18.0)
MCH: 28.1 pg (ref 26.0–34.0)
MCHC: 34.1 g/dL (ref 32.0–36.0)
MCV: 82.4 fL (ref 80.0–100.0)
Platelets: 300 10*3/uL (ref 150–440)
RBC: 4.26 MIL/uL — ABNORMAL LOW (ref 4.40–5.90)
RDW: 16.8 % — ABNORMAL HIGH (ref 11.5–14.5)
WBC: 19.5 10*3/uL — ABNORMAL HIGH (ref 3.8–10.6)

## 2016-07-16 LAB — HEMOGLOBIN A1C
Hgb A1c MFr Bld: 6.9 % — ABNORMAL HIGH (ref 4.8–5.6)
Mean Plasma Glucose: 151 mg/dL

## 2016-07-16 MED ORDER — PANTOPRAZOLE SODIUM 40 MG PO TBEC
40.0000 mg | DELAYED_RELEASE_TABLET | Freq: Every day | ORAL | Status: DC
  Administered 2016-07-17: 40 mg via ORAL
  Filled 2016-07-16: qty 1

## 2016-07-16 MED ORDER — ENOXAPARIN SODIUM 40 MG/0.4ML ~~LOC~~ SOLN
40.0000 mg | SUBCUTANEOUS | Status: DC
  Administered 2016-07-16: 40 mg via SUBCUTANEOUS
  Filled 2016-07-16: qty 0.4

## 2016-07-16 MED ORDER — NICOTINE 21 MG/24HR TD PT24
21.0000 mg | MEDICATED_PATCH | Freq: Every day | TRANSDERMAL | Status: DC
  Administered 2016-07-16 – 2016-07-17 (×2): 21 mg via TRANSDERMAL
  Filled 2016-07-16 (×2): qty 1

## 2016-07-16 NOTE — Progress Notes (Signed)
[place] [facility] [place] NAME: Bradley [name]    MR#:  [id number]  DATE OF BIRTH:  December 17, 1950  SUBJECTIVE:  CHIEF COMPLAINT:  Patient is feeling fine. Denies any other episodes of vomiting of blood. Shortness of breath improved  REVIEW OF SYSTEMS:  CONSTITUTIONAL: No fever, fatigue or weakness.  EYES: No blurred or double vision.  EARS, NOSE, AND THROAT: No tinnitus or ear pain.  RESPIRATORY: Some cough, denies shortness of breath, wheezing or hemoptysis.  CARDIOVASCULAR: No chest pain, orthopnea, edema.  GASTROINTESTINAL: No nausea, vomiting, diarrhea or abdominal pain.  GENITOURINARY: No dysuria, hematuria.  ENDOCRINE: No polyuria, nocturia,  HEMATOLOGY: No anemia, easy bruising or bleeding SKIN: No rash or lesion. MUSCULOSKELETAL: No joint pain or arthritis.   NEUROLOGIC: No tingling, numbness, weakness.  PSYCHIATRY: No anxiety or depression.   DRUG ALLERGIES:  No Known Allergies  VITALS:  Blood pressure 127/63, pulse 97, temperature 98.4 F (36.9 C), temperature source Oral, resp. rate 18, height '5\' 7"'$  (1.702 m), weight 64.1 kg (141 lb 4.8 oz), SpO2 95 %.  PHYSICAL EXAMINATION:  GENERAL:  66 y.o.-year-old patient lying in the bed with no acute distress.  EYES: Pupils equal, round, reactive to light and accommodation. No scleral icterus. Extraocular muscles intact.  HEENT: Head atraumatic, normocephalic. Oropharynx and nasopharynx clear.  NECK:  Supple, no jugular venous distention. No thyroid enlargement, no tenderness.  LUNGS:Moderate breath sounds bilaterally, no wheezing, rales,rhonchi or crepitation. Right lower lobe with crackles No use of accessory muscles of respiration.  CARDIOVASCULAR: S1, S2 normal. No murmurs, rubs, or gallops.  ABDOMEN: Soft, nontender, nondistended. Bowel sounds present. No organomegaly or mass.  EXTREMITIES: No pedal edema, cyanosis, or clubbing.  NEUROLOGIC: Cranial nerves II through XII are intact.  Muscle strength 5/5 in all extremities. Sensation intact. Gait not checked.  PSYCHIATRIC: The patient is alert and oriented x 3.  SKIN: No obvious rash, lesion, or ulcer.    LABORATORY PANEL:   CBC  Recent Labs Lab 07/16/16 0428  WBC 19.5*  HGB 12.0*  HCT 35.1*  PLT 300   ------------------------------------------------------------------------------------------------------------------  Chemistries   Recent Labs Lab 07/15/16 1254 07/16/16 0428  NA 133* 136  K 3.9 4.4  CL 100* 103  CO2 24 25  GLUCOSE 150* 187*  BUN 20 12  CREATININE 0.76 0.61  CALCIUM 9.0 8.9  AST 22  --   ALT 17  --   ALKPHOS 65  --   BILITOT 0.7  --    ------------------------------------------------------------------------------------------------------------------  Cardiac Enzymes  Recent Labs Lab 07/15/16 1254  TROPONINI <0.03   ------------------------------------------------------------------------------------------------------------------  RADIOLOGY:  Dg Chest 2 View  Result Date: 07/15/2016 CLINICAL DATA:  Hematemesis.  Abdominal pain . EXAM: CHEST  2 VIEW COMPARISON:  12/27/2015 . FINDINGS: Mediastinum and hilar structures normal. Heart size normal. Increased basilar interstitial markings noted. Findings suggest pneumonitis. Underlying chronic interstitial changes are also present. Bilateral pleural-parenchymal thickening noted consistent with scarring. COPD. No pneumothorax. IMPRESSION: 1. Increased basilar interstitial prominence consistent with pneumonitis. 2. Chronic interstitial lung disease and bilateral pleural-parenchymal scarring. COPD. Electronically Signed   By: Marcello Moores  Register   On: 07/15/2016 13:49   Ct Abdomen Pelvis W Contrast  Result Date: 07/15/2016 CLINICAL DATA:  Hematemesis, upper abdominal pain. EXAM: CT ABDOMEN AND PELVIS WITH CONTRAST TECHNIQUE: Multidetector CT imaging of the abdomen and pelvis was performed using the standard protocol following bolus  administration of intravenous contrast. CONTRAST:  134m ISOVUE-300 IOPAMIDOL (ISOVUE-300) INJECTION 61% COMPARISON:  CT  scan of May 03, 2014. FINDINGS: Lower chest: Emphysematous disease is noted in both lung bases. Pneumonia or atelectasis is noted posteriorly in the right lower lobe. Hepatobiliary: No focal liver abnormality is seen. No gallstones, gallbladder wall thickening, or biliary dilatation. Pancreas: Unremarkable. No pancreatic ductal dilatation or surrounding inflammatory changes. Spleen: Normal in size without focal abnormality. Adrenals/Urinary Tract: Adrenal glands are unremarkable. Left renal cysts are noted. No hydronephrosis or renal obstruction is noted. No renal or ureteral calculi are noted. Urinary bladder is unremarkable. Stomach/Bowel: There is no evidence of bowel obstruction. No inflammatory changes are noted. The appendix is not clearly visualized. Vascular/Lymphatic: Aortic atherosclerosis. No enlarged abdominal or pelvic lymph nodes. Reproductive: Prostate is unremarkable. Other: No abdominal wall hernia or abnormality. No abdominopelvic ascites. Musculoskeletal: No acute or significant osseous findings. IMPRESSION: Stable emphysematous changes are noted in both lung bases. Increased right lung opacity is noted concerning for atelectasis or possibly pneumonia. Aortic atherosclerosis. No other definite abnormality seen in the abdomen or pelvis. Electronically Signed   By: Marijo Conception, M.D.   On: 07/15/2016 15:22    EKG:   Orders placed or performed during the hospital encounter of 07/15/16  . EKG 12-Lead  . EKG 12-Lead    ASSESSMENT AND PLAN:   * Acute hypoxic respiratory failure likely secondary to aspiration and right lower lobe pneumonitis and COPD exacerbation . Sepsis present on admission Continue Levaquin and add clindamycin. Steroids. Nebulizers. Wean oxygen as tolerated. IV fluids Patient was on oxygen at home  6 weeks back and may need it again at  discharge.  * Vomiting and epigastric pain likely due to alcohol binge Prior to admission. He did see some streaks of blood could be Mallory-Weiss tear or gastritis which is resolved at this time. Hemoglobin is stable at 13.1-12.0. On IV Protonix, change to by mouth. Repeat CBC in a.m. if needed consider GI consult  * Diabetes mellitus. Sliding scale insulin. Home medications.  * Hypertension. Continue home medicationsq  * DVT prophylaxis with Lovenox     All the records are reviewed and case discussed with Care Management/Social Workerr. Management plans discussed with the patient, family and they are in agreement.  CODE STATUS: fc   TOTAL TIME TAKING CARE OF THIS PATIENT: 36  minutes.   POSSIBLE D/C IN 1-2 DAYS, DEPENDING ON CLINICAL CONDITION.  Note: This dictation was prepared with Dragon dictation along with smaller phrase technology. Any transcriptional errors that result from this process are unintentional.   Nicholes Mango M.D on 07/16/2016 at 4:04 PM  Between 7am to 6pm - Pager - [phone] After 6pm go to [email or web address] - password [email or web address] [facility]  Office  [phone]  CC: Primary care physician; Valera Castle, MD

## 2016-07-16 NOTE — Progress Notes (Signed)
Inpatient Diabetes Program Recommendations  AACE/ADA: New Consensus Statement on Inpatient Glycemic Control (2015)  Target Ranges:  Prepandial:   less than 140 mg/dL      Peak postprandial:   less than 180 mg/dL (1-2 hours)      Critically ill patients:  140 - 180 mg/dL   Lab Results  Component Value Date   GLUCAP 143 (H) 07/16/2016   HGBA1C 6.9 (H) 07/15/2016    Review of Glycemic Control  [name], Bradley [name] (MRN [id number]) as of 07/16/2016 11:14  Ref. Range 07/15/2016 18:17 07/15/2016 21:10 07/16/2016 07:49  Glucose-Capillary Latest Ref Range: 65 - 99 mg/dL 166 (H) 276 (H) 143 (H)    Diabetes history: Type 2 Outpatient Diabetes medications: Metformin '500mg'$  qam and pm if blood sugars are elevated Current orders for Inpatient glycemic control: Metformin '500mg'$  bid, Novlog 0-9 units tid, Novolog 0-5 units qhs *prednisone  Inpatient Diabetes Program Recommendations:   Agree with current medications for blood sugar management.    Gentry Fitz, RN, BA, MHA, CDE Diabetes Coordinator Inpatient Diabetes Program  [phone] (Team Pager) [phone] ([place]) 07/16/2016 11:17 AM

## 2016-07-17 LAB — BASIC METABOLIC PANEL
Anion gap: 7 (ref 5–15)
BUN: 17 mg/dL (ref 6–20)
CALCIUM: 8.8 mg/dL — AB (ref 8.9–10.3)
CO2: 27 mmol/L (ref 22–32)
Chloride: 102 mmol/L (ref 101–111)
Creatinine, Ser: 0.56 mg/dL — ABNORMAL LOW (ref 0.61–1.24)
GFR calc Af Amer: >60 mL/min (ref >60)
GLUCOSE: 146 mg/dL — AB (ref 65–99)
POTASSIUM: 4 mmol/L (ref 3.5–5.1)
Sodium: 136 mmol/L (ref 135–145)

## 2016-07-17 LAB — CBC
HCT: 35.3 % — ABNORMAL LOW (ref 40.0–52.0)
Hemoglobin: 11.7 g/dL — ABNORMAL LOW (ref 13.0–18.0)
MCH: 26.8 pg (ref 26.0–34.0)
MCHC: 33.2 g/dL (ref 32.0–36.0)
MCV: 80.6 fL (ref 80.0–100.0)
PLATELETS: 327 10*3/uL (ref 150–440)
RBC: 4.37 MIL/uL — ABNORMAL LOW (ref 4.40–5.90)
RDW: 16.8 % — AB (ref 11.5–14.5)
WBC: 21.4 10*3/uL — ABNORMAL HIGH (ref 3.8–10.6)

## 2016-07-17 LAB — GLUCOSE, CAPILLARY: Glucose-Capillary: 146 mg/dL — ABNORMAL HIGH (ref 65–99)

## 2016-07-17 MED ORDER — LEVOFLOXACIN 500 MG PO TABS: 500.0000 mg | ORAL_TABLET | Freq: Every day | ORAL | 0 refills | Status: DC

## 2016-07-17 MED ORDER — NYSTATIN 100000 UNIT/ML MT SUSP: 5.0000 mL | Freq: Four times a day (QID) | OROMUCOSAL | Status: DC

## 2016-07-17 MED ORDER — TIOTROPIUM BROMIDE MONOHYDRATE 18 MCG IN CAPS: 18.0000 ug | ORAL_CAPSULE | Freq: Every day | RESPIRATORY_TRACT | 0 refills | Status: DC

## 2016-07-17 MED ORDER — NYSTATIN 100000 UNIT/ML MT SUSP: 5.0000 mL | Freq: Four times a day (QID) | OROMUCOSAL | 0 refills | Status: DC | PRN

## 2016-07-17 MED ORDER — PREDNISONE 5 MG PO TABS: ORAL_TABLET | ORAL | 0 refills | Status: DC

## 2016-07-17 MED ORDER — NICOTINE 21 MG/24HR TD PT24: 21.0000 mg | MEDICATED_PATCH | Freq: Every day | TRANSDERMAL | 0 refills | Status: DC

## 2016-07-17 NOTE — Discharge Summary (Signed)
[name] NAME: Bradley [name]    MR#:  [id number]  DATE OF BIRTH:  November 12, 1950  DATE OF ADMISSION:  07/15/2016 ADMITTING PHYSICIAN: Hillary Bow, MD  DATE OF DISCHARGE: 07/17/2016 12:06 PM  PRIMARY CARE PHYSICIAN: Valera Castle, MD    ADMISSION DIAGNOSIS:  COPD exacerbation ([place]) [J44.1] Acute respiratory failure with hypoxia ([place]) [J96.01] Sepsis, due to unspecified organism ([place]) [A41.9] Community acquired pneumonia, unspecified laterality [J18.9]  DISCHARGE DIAGNOSIS:  Active Problems:   Pneumonia   SECONDARY DIAGNOSIS:   Past Medical History:  Diagnosis Date  . COPD (chronic obstructive pulmonary disease) ([place])   . Coronary artery disease   . Diabetes mellitus without complication ([place])   . Hypertension     HOSPITAL COURSE:   1. Acute hypoxic respiratory failure secondary to pneumonia. Pulse ox with ambulation is 87% so he does qualify for oxygen at home. Patient already has oxygen at home. 2. Pneumonia, community-acquired and possible aspiration. Leukocytosis likely still high secondary to steroids. Levaquin will cover. 3. COPD exacerbation. Quick steroid taper. Continue usual inhalers at home. 4. Vomiting and epigastric pain resolved 5. Essential hypertension. Continue usual medications  6. Type 2 diabetes mellitus. Even with steroid sugars are under good control 7. Hematemesis likely secondary to vomiting. No other bleeding and hemoglobin remained stable.  DISCHARGE CONDITIONS:   Satisfactory  CONSULTS OBTAINED:   none  DRUG ALLERGIES:  No Known Allergies  DISCHARGE MEDICATIONS:   Discharge Medication List as of 07/17/2016 10:51 AM    START taking these medications   Details  levofloxacin (LEVAQUIN) 500 MG tablet Take 1 tablet (500 mg total) by mouth daily., Starting Wed 07/17/2016, Print    nicotine (NICODERM CQ - DOSED IN MG/24 HOURS) 21 mg/24hr patch Place 1 patch (21 mg total) onto the skin  daily., Starting Thu 07/18/2016, Print    nystatin (MYCOSTATIN) 100000 UNIT/ML suspension Take 5 mLs (500,000 Units total) by mouth 4 (four) times daily as needed., Starting Wed 07/17/2016, Print      CONTINUE these medications which have CHANGED   Details  predniSONE (DELTASONE) 5 MG tablet 4 tabs po day1; 3 tabs po day2; 2 tabs po day3; 1 tab po day4, Print    tiotropium (SPIRIVA) 18 MCG inhalation capsule Place 1 capsule (18 mcg total) into inhaler and inhale daily., Starting Wed 07/17/2016, Print      CONTINUE these medications which have NOT CHANGED   Details  albuterol (PROVENTIL) (2.5 MG/3ML) 0.083% nebulizer solution Take 2.5 mg by nebulization every 6 (six) hours as needed for wheezing or shortness of breath., Until Discontinued, Historical Med    aspirin EC 81 MG tablet Take 81 mg by mouth daily., Until Discontinued, Historical Med    clopidogrel (PLAVIX) 75 MG tablet Take 75 mg by mouth daily., Until Discontinued, Historical Med    enalapril (VASOTEC) 10 MG tablet Take 10 mg by mouth daily., Until Discontinued, Historical Med    Fluticasone-Salmeterol (ADVAIR) 250-50 MCG/DOSE AEPB Inhale 1 puff into the lungs 2 (two) times daily., Starting Wed 09/20/2015, Print    metFORMIN (GLUCOPHAGE) 500 MG tablet Take 1 tablet (500 mg total) by mouth 2 (two) times daily with a meal. Takes every morning and then takes another one in the evening if his "sugar starts acting up", Starting Sat 12/30/2015, Normal    metoprolol (LOPRESSOR) 50 MG tablet Take 50 mg by mouth 2 (two) times daily., Until Discontinued, Historical Med    nitroGLYCERIN (NITROSTAT) 0.4 MG  SL tablet Place 0.4 mg under the tongue every 5 (five) minutes as needed for chest pain., Until Discontinued, Historical Med    PROAIR HFA 108 (90 Base) MCG/ACT inhaler Inhale 2 puffs into the lungs every 6 (six) hours as needed for wheezing or shortness of breath., Starting Tue 07/02/2016, Historical Med    simvastatin (ZOCOR) 20 MG tablet  Take 20 mg by mouth at bedtime. , Until Discontinued, Historical Med      STOP taking these medications     azithromycin (ZITHROMAX) 250 MG tablet      cefUROXime (CEFTIN) 500 MG tablet      guaiFENesin-dextromethorphan (ROBITUSSIN DM) 100-10 MG/5ML syrup      mirtazapine (REMERON) 15 MG tablet          DISCHARGE INSTRUCTIONS:   Follow-up PMD one week  If you experience worsening of your admission symptoms, develop shortness of breath, life threatening emergency, suicidal or homicidal thoughts you must seek medical attention immediately by calling 911 or calling your MD immediately  if symptoms less severe.  You Must read complete instructions/literature along with all the possible adverse reactions/side effects for all the Medicines you take and that have been prescribed to you. Take any new Medicines after you have completely understood and accept all the possible adverse reactions/side effects.   Please note  You were cared for by a hospitalist during your hospital stay. If you have any questions about your discharge medications or the care you received while you were in the hospital after you are discharged, you can call the unit and asked to speak with the hospitalist on call if the hospitalist that took care of you is not available. Once you are discharged, your primary care physician will handle any further medical issues. Please note that NO REFILLS for any discharge medications will be authorized once you are discharged, as it is imperative that you return to your primary care physician (or establish a relationship with a primary care physician if you do not have one) for your aftercare needs so that they can reassess your need for medications and monitor your lab values.    Today   CHIEF COMPLAINT:   Chief Complaint  Patient presents with  . Shortness of Breath  . Hematemesis    HISTORY OF PRESENT ILLNESS:  Bradley [name]  is a 66 y.o. male with a known history of  Presented with shortness of breath.   VITAL SIGNS:  Blood pressure (!) 143/82, pulse 86, temperature 98 F (36.7 C), temperature source Oral, resp. rate 16, height '5\' 7"'$  (1.702 m), weight 65.4 kg (144 lb 1.6 oz), SpO2 96 %.  I/O:   Intake/Output Summary (Last 24 hours) at 07/17/16 1713 Last data filed at 07/17/16 2831  Gross per 24 hour  Intake             1960 ml  Output             1680 ml  Net              280 ml    PHYSICAL EXAMINATION:  GENERAL:  66 y.o.-year-old patient lying in the bed with no acute distress.  EYES: Pupils equal, round, reactive to light and accommodation. No scleral icterus. Extraocular muscles intact.  HEENT: Head atraumatic, normocephalic. Oropharynx and nasopharynx clear.  NECK:  Supple, no jugular venous distention. No thyroid enlargement, no tenderness.  LUNGS: Normal breath sounds bilaterally, no wheezing, rales,rhonchi or crepitation. No use of accessory muscles of respiration.  CARDIOVASCULAR: S1, S2 normal. No murmurs, rubs, or gallops.  ABDOMEN: Soft, non-tender, non-distended. Bowel sounds present. No organomegaly or mass.  EXTREMITIES: No pedal edema, cyanosis, or clubbing.  NEUROLOGIC: Cranial nerves II through XII are intact. Muscle strength 5/5 in all extremities. Sensation intact. Gait not checked.  PSYCHIATRIC: The patient is alert and oriented x 3.  SKIN: No obvious rash, lesion, or ulcer.   DATA REVIEW:   CBC  Recent Labs Lab 07/17/16 0340  WBC 21.4*  HGB 11.7*  HCT 35.3*  PLT 327    Chemistries   Recent Labs Lab 07/15/16 1254  07/17/16 0340  NA 133*  < > 136  K 3.9  < > 4.0  CL 100*  < > 102  CO2 24  < > 27  GLUCOSE 150*  < > 146*  BUN 20  < > 17  CREATININE 0.76  < > 0.56*  CALCIUM 9.0  < > 8.8*  AST 22  --   --   ALT 17  --   --   ALKPHOS 65  --   --   BILITOT 0.7  --   --   < > = values in this interval not displayed.  Cardiac Enzymes  Recent Labs Lab 07/15/16 1254  TROPONINI <0.03    Microbiology  Results  Results for orders placed or performed during the hospital encounter of 07/15/16  Blood Culture (routine x 2)     Status: None (Preliminary result)   Collection Time: 07/15/16  4:30 PM  Result Value Ref Range Status   Specimen Description BLOOD RESISTANT ARM  Final   Special Requests   Final    BOTTLES DRAWN AEROBIC AND ANAEROBIC Blood Culture results may not be optimal due to an excessive volume of blood received in culture bottles   Culture NO GROWTH 2 DAYS  Final   Report Status PENDING  Incomplete  Blood Culture (routine x 2)     Status: None (Preliminary result)   Collection Time: 07/15/16  4:30 PM  Result Value Ref Range Status   Specimen Description BLOOD LEFT [facility]  Final   Special Requests   Final    BOTTLES DRAWN AEROBIC AND ANAEROBIC Blood Culture adequate volume   Culture NO GROWTH 2 DAYS  Final   Report Status PENDING  Incomplete     Management plans discussed with the patient, And she is in agreement.  CODE STATUS:  Code Status History    Date Active Date Inactive Code Status Order ID Comments User Context   07/15/2016  4:39 PM 07/17/2016  3:06 PM Full Code [id number]  Hillary Bow, MD ED   12/27/2015  3:30 PM 12/30/2015  2:58 PM Full Code [id number]  Theodoro Grist, MD Inpatient   09/19/2015  6:37 PM 09/20/2015  5:10 PM Full Code [id number]  Theodoro Grist, MD Inpatient   04/12/2015 10:23 AM 04/16/2015  8:48 PM Full Code [id number]  Vaughan Basta, MD Inpatient   10/08/2014  3:48 PM 10/11/2014  2:26 PM Full Code [id number]  Idelle Crouch, MD Inpatient    Advance Directive Documentation     Most Recent Value  Type of Advance Directive  Healthcare Power of [place], Living will  Pre-existing out of facility DNR order (yellow form or pink MOST form)  -  "MOST" Form in Place?  -      TOTAL TIME TAKING CARE OF THIS PATIENT: 35 minutes.    Loletha Grayer M.D on 07/17/2016 at 5:13 PM  Between 7am to 6pm -  Pager - [phone]  After 6pm go to  [email or web address] - password [email or web address] [facility]  [phone]  CC: Primary care physician; Valera Castle, MD

## 2016-07-17 NOTE — Progress Notes (Signed)
Patient is being discharged home with family. O2 tank at bedside from home. Room bin meds returned to patient. Reviewed meds, scripts, and last dose given. Allowed time for questions. IVs removed with caths intact.

## 2016-07-17 NOTE — Discharge Instructions (Addendum)
Community-Acquired Pneumonia, Adult °Pneumonia is an infection of the lungs. One type of pneumonia can happen while a person is in a hospital. A different type can happen when a person is not in a hospital (community-acquired pneumonia). It is easy for this kind to spread from person to person. It can spread to you if you breathe near an infected person who coughs or sneezes. Some symptoms include: °· A dry cough. °· A wet (productive) cough. °· Fever. °· Sweating. °· Chest pain. °Follow these instructions at home: °· Take over-the-counter and prescription medicines only as told by your doctor. °¨ Only take cough medicine if you are losing sleep. °¨ If you were prescribed an antibiotic medicine, take it as told by your doctor. Do not stop taking the antibiotic even if you start to feel better. °· Sleep with your head and neck raised (elevated). You can do this by putting a few pillows under your head, or you can sleep in a recliner. °· Do not use tobacco products. These include cigarettes, chewing tobacco, and e-cigarettes. If you need help quitting, ask your doctor. °· Drink enough water to keep your pee (urine) clear or pale yellow. °A shot (vaccine) can help prevent pneumonia. Shots are often suggested for: °· People older than 67 years of age. °· People older than 67 years of age: °¨ Who are having cancer treatment. °¨ Who have long-term (chronic) lung disease. °¨ Who have problems with their body's defense system (immune system). °You may also prevent pneumonia if you take these actions: °· Get the flu (influenza) shot every year. °· Go to the dentist as often as told. °· Wash your hands often. If soap and water are not available, use hand sanitizer. °Contact a doctor if: °· You have a fever. °· You lose sleep because your cough medicine does not help. °Get help right away if: °· You are short of breath and it gets worse. °· You have more chest pain. °· Your sickness gets worse. This is very serious if: °¨ You  are an older adult. °¨ Your body's defense system is weak. °· You cough up blood. °This information is not intended to replace advice given to you by your health care provider. Make sure you discuss any questions you have with your health care provider. °Document Released: 08/07/2007 Document Revised: 07/27/2015 Document Reviewed: 06/15/2014 °Elsevier Interactive Patient Education © 2017 [employer] ° °

## 2016-07-17 NOTE — Plan of Care (Signed)
Problem: Safety: Goal: Ability to remain free from injury will improve Outcome: Progressing Pt able to remain free from falls this shift.  Problem: Pain Managment: Goal: General experience of comfort will improve Outcome: Adequate for Discharge No complaints of pain this shift.

## 2016-07-17 NOTE — Progress Notes (Signed)
SATURATION QUALIFICATIONS: (This note is used to comply with regulatory documentation for home oxygen)  Patient Saturations on Room Air at Rest = 91%  Patient Saturations on Room Air while Ambulating =87%  Patient Saturations on 2 Liters of oxygen while Ambulating = 92%  Please briefly explain why patient needs home oxygen: 

## 2016-07-17 NOTE — Progress Notes (Signed)
Pt has remained alert and oriented. Pt still remaining on 2L of oxygen. No complaints of pain this shift. Non productive cough.

## 2016-07-20 LAB — CULTURE, BLOOD (ROUTINE X 2)
CULTURE: NO GROWTH
CULTURE: NO GROWTH
Special Requests: ADEQUATE

## 2016-07-24 DIAGNOSIS — Z09 Encounter for follow-up examination after completed treatment for conditions other than malignant neoplasm: Secondary | ICD-10-CM | POA: Diagnosis not present

## 2016-07-24 DIAGNOSIS — D638 Anemia in other chronic diseases classified elsewhere: Secondary | ICD-10-CM | POA: Diagnosis not present

## 2016-07-24 DIAGNOSIS — J690 Pneumonitis due to inhalation of food and vomit: Secondary | ICD-10-CM | POA: Diagnosis not present

## 2016-07-24 DIAGNOSIS — J441 Chronic obstructive pulmonary disease with (acute) exacerbation: Secondary | ICD-10-CM | POA: Diagnosis not present

## 2016-07-24 DIAGNOSIS — J159 Unspecified bacterial pneumonia: Secondary | ICD-10-CM | POA: Diagnosis not present

## 2016-08-07 ENCOUNTER — Encounter: Payer: Self-pay | Admitting: Hematology and Oncology

## 2016-08-07 ENCOUNTER — Inpatient Hospital Stay: Payer: PPO | Attending: Hematology and Oncology | Admitting: Hematology and Oncology

## 2016-08-07 ENCOUNTER — Inpatient Hospital Stay: Payer: PPO

## 2016-08-07 VITALS — BP 121/73 | HR 76 | Temp 95.8°F | Resp 18 | Wt 139.1 lb

## 2016-08-07 DIAGNOSIS — I2510 Atherosclerotic heart disease of native coronary artery without angina pectoris: Secondary | ICD-10-CM | POA: Diagnosis not present

## 2016-08-07 DIAGNOSIS — J440 Chronic obstructive pulmonary disease with acute lower respiratory infection: Secondary | ICD-10-CM | POA: Diagnosis not present

## 2016-08-07 DIAGNOSIS — I10 Essential (primary) hypertension: Secondary | ICD-10-CM | POA: Insufficient documentation

## 2016-08-07 DIAGNOSIS — D649 Anemia, unspecified: Secondary | ICD-10-CM | POA: Insufficient documentation

## 2016-08-07 DIAGNOSIS — E119 Type 2 diabetes mellitus without complications: Secondary | ICD-10-CM | POA: Insufficient documentation

## 2016-08-07 DIAGNOSIS — Z7984 Long term (current) use of oral hypoglycemic drugs: Secondary | ICD-10-CM | POA: Insufficient documentation

## 2016-08-07 DIAGNOSIS — D509 Iron deficiency anemia, unspecified: Secondary | ICD-10-CM | POA: Insufficient documentation

## 2016-08-07 DIAGNOSIS — Z87891 Personal history of nicotine dependence: Secondary | ICD-10-CM | POA: Diagnosis not present

## 2016-08-07 DIAGNOSIS — Z79899 Other long term (current) drug therapy: Secondary | ICD-10-CM | POA: Insufficient documentation

## 2016-08-07 DIAGNOSIS — E538 Deficiency of other specified B group vitamins: Secondary | ICD-10-CM | POA: Diagnosis not present

## 2016-08-07 DIAGNOSIS — Z7982 Long term (current) use of aspirin: Secondary | ICD-10-CM | POA: Insufficient documentation

## 2016-08-07 DIAGNOSIS — D801 Nonfamilial hypogammaglobulinemia: Secondary | ICD-10-CM | POA: Diagnosis not present

## 2016-08-07 DIAGNOSIS — D72829 Elevated white blood cell count, unspecified: Secondary | ICD-10-CM

## 2016-08-07 LAB — CBC WITH DIFFERENTIAL/PLATELET
Basophils Absolute: 0.1 10*3/uL (ref 0–0.1)
Basophils Relative: 1 %
Eosinophils Absolute: 0.4 10*3/uL (ref 0–0.7)
Eosinophils Relative: 5 %
HCT: 41.5 % (ref 40.0–52.0)
Hemoglobin: 13.7 g/dL (ref 13.0–18.0)
Lymphocytes Relative: 22 %
Lymphs Abs: 2.1 10*3/uL (ref 1.0–3.6)
MCH: 27.1 pg (ref 26.0–34.0)
MCHC: 33.1 g/dL (ref 32.0–36.0)
MCV: 81.9 fL (ref 80.0–100.0)
Monocytes Absolute: 0.9 10*3/uL (ref 0.2–1.0)
Monocytes Relative: 10 %
Neutro Abs: 5.8 10*3/uL (ref 1.4–6.5)
Neutrophils Relative %: 62 %
Platelets: 337 10*3/uL (ref 150–440)
RBC: 5.06 MIL/uL (ref 4.40–5.90)
RDW: 17.6 % — ABNORMAL HIGH (ref 11.5–14.5)
WBC: 9.3 10*3/uL (ref 3.8–10.6)

## 2016-08-07 LAB — VITAMIN B12: Vitamin B-12: 319 pg/mL (ref 180–914)

## 2016-08-07 LAB — FERRITIN: Ferritin: 16 ng/mL — ABNORMAL LOW (ref 24–336)

## 2016-08-07 LAB — IRON AND TIBC
Iron: 97 ug/dL (ref 45–182)
Saturation Ratios: 24 % (ref 17.9–39.5)
TIBC: 399 ug/dL (ref 250–450)
UIBC: 302 ug/dL

## 2016-08-07 LAB — SEDIMENTATION RATE: Sed Rate: 6 mm/hr (ref 0–20)

## 2016-08-07 LAB — TSH: TSH: 4.487 u[IU]/mL (ref 0.350–4.500)

## 2016-08-07 LAB — RETICULOCYTES
RBC.: 4.95 MIL/uL (ref 4.40–5.90)
Retic Count, Absolute: 64.4 10*3/uL (ref 19.0–183.0)
Retic Ct Pct: 1.3 % (ref 0.4–3.1)

## 2016-08-07 LAB — FOLATE: Folate: 17.6 ng/mL (ref >5.9)

## 2016-08-07 NOTE — Progress Notes (Signed)
Patient here today as new evaluation.  Referred by Dr. Johny Drilling.

## 2016-08-07 NOTE — Progress Notes (Signed)
[facility] day:  08/07/2016  Chief Complaint: Bradley [name] is a 66 y.o. male with anemia who is referred in consultation by Dr. Johny Drilling for assessment and management.  HPI:  The patient states that a few weeks ago he asked his physician for a blood check as she is had "trouble with white blood cells jumping out of site". He states that his wife died of blood cancer 16 months ago. He is also had some family members with cancer. He has been concern.  Patient notes being hospitalized 2 times/year in the past 3 years for pneumonia. He has COPD.  He denies any other infections. He had a colonoscopy on 07/01/2012 By Dr. Sharyne Richters.  Colon was normal except for diverticulosis.  He has never had an ulcer.  He has never had an upper endoscopy.  Regarding his diet, he states "I eat everything I want to".  He eats meat daily.  He denies any pica.  PET scan at [facility] on 07/12/2016 revealed postsurgical changes status post partial lower lobe resection.  There was heterogeneous opacity at the resection site favoring to represent atelectasis with  possible superimposed inflammatory/infectious process. There were other small nodules within the right lower lobe demonstrating minimal activity and stable.  CBC on 07/24/2016 revealed a hematocrit 39.1, hemoglobin 13.4, MCV 78, platelets 387,000, white count 12,100. CBC on 12/28/2014 revealed a hematocrit of 41.8, hemoglobin 13.6, MCV 82, platelets 410,000, white count 12,100.  CBC on 06/21/2014 revealed a hematocrit of 39.1, hemoglobin 13.1, MCV 87, platelets 370,000, white count 10,800.  Creatinine was 0.7 on 07/24/2016.  Symptomatically, he denies any B symptoms. He has a dry cough.    Past Medical History:  Diagnosis Date  . COPD (chronic obstructive pulmonary disease) ([place])   . Coronary artery disease   . Diabetes mellitus without complication ([place])   . Hypertension     Past Surgical History:  Procedure  Laterality Date  . LUNG BIOPSY Right    2006  . right side partial lobe removed       Family History  Problem Relation Age of Onset  . Cancer Mother   . Heart attack Father   . Cancer Sister     Social History:  reports that he quit smoking about 7 weeks ago. He smoked 0.50 packs per day. He has never used smokeless tobacco. He reports that he does not drink alcohol or use drugs.  He smoked 1 pack/day since age 53 until 9 weeks ago.  He denies any exposure to radiation or toxins.  He is retired from working for the [employer].  His wife died 63 months ago with "cancer in the blood" (? leukemia/lymphoma).  The patient is alone today.  Allergies: No Known Allergies  Current Medications: Current Outpatient Prescriptions  Medication Sig Dispense Refill  . albuterol (PROVENTIL) (2.5 MG/3ML) 0.083% nebulizer solution Take 2.5 mg by nebulization every 6 (six) hours as needed for wheezing or shortness of breath.    Marland Kitchen aspirin EC 81 MG tablet Take 81 mg by mouth daily.    . clopidogrel (PLAVIX) 75 MG tablet Take 75 mg by mouth daily.    . enalapril (VASOTEC) 10 MG tablet Take 10 mg by mouth daily.    . Fluticasone-Salmeterol (ADVAIR) 250-50 MCG/DOSE AEPB Inhale 1 puff into the lungs 2 (two) times daily. 60 each 0  . metFORMIN (GLUCOPHAGE) 500 MG tablet Take 1 tablet (500 mg total) by mouth 2 (  two) times daily with a meal. Takes every morning and then takes another one in the evening if his "sugar starts acting up" (Patient taking differently: Take 500 mg by mouth daily with breakfast. ) 60 tablet 0  . metoprolol (LOPRESSOR) 50 MG tablet Take 50 mg by mouth 2 (two) times daily.    . nicotine (NICODERM CQ - DOSED IN MG/24 HOURS) 21 mg/24hr patch Place 1 patch (21 mg total) onto the skin daily. 28 patch 0  . nitroGLYCERIN (NITROSTAT) 0.4 MG SL tablet Place 0.4 mg under the tongue every 5 (five) minutes as needed for chest pain.    Marland Kitchen PROAIR HFA 108 (90 Base) MCG/ACT inhaler Inhale 2 puffs into the  lungs every 6 (six) hours as needed for wheezing or shortness of breath.    . simvastatin (ZOCOR) 20 MG tablet Take 20 mg by mouth at bedtime.     Marland Kitchen tiotropium (SPIRIVA) 18 MCG inhalation capsule Place 1 capsule (18 mcg total) into inhaler and inhale daily. 30 capsule 0  . levofloxacin (LEVAQUIN) 500 MG tablet Take 1 tablet (500 mg total) by mouth daily. (Patient not taking: Reported on 08/07/2016) 5 tablet 0  . nystatin (MYCOSTATIN) 100000 UNIT/ML suspension Take 5 mLs (500,000 Units total) by mouth 4 (four) times daily as needed. (Patient not taking: Reported on 08/07/2016) 60 mL 0  . predniSONE (DELTASONE) 5 MG tablet 4 tabs po day1; 3 tabs po day2; 2 tabs po day3; 1 tab po day4 (Patient not taking: Reported on 08/07/2016) 10 tablet 0   No current facility-administered medications for this visit.     Review of Systems:  GENERAL:  Feels "ok".  No fevers, sweats or weight loss. PERFORMANCE STATUS (ECOG):  0 HEENT:  Congestion due to allergies.  No visual changes, runny nose, sore throat, mouth sores or tenderness. Lungs: No shortness of breath.  Dry cough.  No hemoptysis. Cardiac:  No chest pain, palpitations, orthopnea, or PND. GI:  No nausea, vomiting, diarrhea, constipation, melena or hematochezia. GU:  No urgency, frequency, dysuria, or hematuria. Musculoskeletal:  No back pain.  No joint pain.  No muscle tenderness. Extremities:  No pain or swelling. Skin:  No rashes or skin changes. Neuro:  No headache, numbness or weakness, balance or coordination issues. Endocrine:  No diabetes, thyroid issues, hot flashes or night sweats. Psych:  No mood changes, depression or anxiety. Pain:  No focal pain. Review of systems:  All other systems reviewed and found to be negative.  Physical Exam: Blood pressure 121/73, pulse 76, temperature (!) 95.8 F (35.4 C), temperature source Tympanic, resp. rate 18, weight 139 lb 1.8 oz (63.1 kg). GENERAL:  Thin gentleman sitting comfortably in the exam room in  no acute distress. MENTAL STATUS:  Alert and oriented to person, place and time. HEAD:  Pearline Cables hair.  Male pattern baldness.  Goatee.  Normocephalic, atraumatic, face symmetric, no Cushingoid features. EYES:  Blue eyes.  Pupils equal round and reactive to light and accomodation.  No conjunctivitis or scleral icterus. ENT:  Oropharynx clear without lesion.  Tongue normal. Mucous membranes moist.  RESPIRATORY:  Clear to auscultation without rales, wheezes or rhonchi. CARDIOVASCULAR:  Regular rate and rhythm without murmur, rub or gallop. ABDOMEN:  Soft, non-tender, with active bowel sounds, and no hepatosplenomegaly.  No masses. SKIN:  Ecchymosis on arms.  Tan.  No rashes, ulcers or lesions. EXTREMITIES: No edema, no skin discoloration or tenderness.  No palpable cords. LYMPH NODES: No palpable cervical, supraclavicular, axillary or inguinal adenopathy  NEUROLOGICAL: Unremarkable. PSYCH:  Appropriate.   No visits with results within 3 Day(s) from this visit.  Latest known visit with results is:  Admission on 07/15/2016, Discharged on 07/17/2016  Component Date Value Ref Range Status  . WBC 07/15/2016 23.6* 3.8 - 10.6 K/uL Final  . RBC 07/15/2016 4.85  4.40 - 5.90 MIL/uL Final  . Hemoglobin 07/15/2016 13.1  13.0 - 18.0 g/dL Final  . HCT 07/15/2016 39.3* 40.0 - 52.0 % Final  . MCV 07/15/2016 81.0  80.0 - 100.0 fL Final  . MCH 07/15/2016 27.1  26.0 - 34.0 pg Final  . MCHC 07/15/2016 33.4  32.0 - 36.0 g/dL Final  . RDW 07/15/2016 16.4* 11.5 - 14.5 % Final  . Platelets 07/15/2016 355  150 - 440 K/uL Final  . Neutrophils Relative % 07/15/2016 88  % Final  . Neutro Abs 07/15/2016 20.9* 1.4 - 6.5 K/uL Final  . Lymphocytes Relative 07/15/2016 4  % Final  . Lymphs Abs 07/15/2016 0.8* 1.0 - 3.6 K/uL Final  . Monocytes Relative 07/15/2016 6  % Final  . Monocytes Absolute 07/15/2016 1.5* 0.2 - 1.0 K/uL Final  . Eosinophils Relative 07/15/2016 1  % Final  . Eosinophils Absolute 07/15/2016 0.1  0 -  0.7 K/uL Final  . Basophils Relative 07/15/2016 1  % Final  . Basophils Absolute 07/15/2016 0.2* 0 - 0.1 K/uL Final  . Sodium 07/15/2016 133* 135 - 145 mmol/L Final  . Potassium 07/15/2016 3.9  3.5 - 5.1 mmol/L Final  . Chloride 07/15/2016 100* 101 - 111 mmol/L Final  . CO2 07/15/2016 24  22 - 32 mmol/L Final  . Glucose, Bld 07/15/2016 150* 65 - 99 mg/dL Final  . BUN 07/15/2016 20  6 - 20 mg/dL Final  . Creatinine, Ser 07/15/2016 0.76  0.61 - 1.24 mg/dL Final  . Calcium 07/15/2016 9.0  8.9 - 10.3 mg/dL Final  . Total Protein 07/15/2016 7.1  6.5 - 8.1 g/dL Final  . Albumin 07/15/2016 4.0  3.5 - 5.0 g/dL Final  . AST 07/15/2016 22  15 - 41 U/L Final  . ALT 07/15/2016 17  17 - 63 U/L Final  . Alkaline Phosphatase 07/15/2016 65  38 - 126 U/L Final  . Total Bilirubin 07/15/2016 0.7  0.3 - 1.2 mg/dL Final  . GFR calc non Af Amer 07/15/2016 >60  >60 mL/min Final  . GFR calc Af Amer 07/15/2016 >60  >60 mL/min Final   Comment: (NOTE) The eGFR has been calculated using the CKD EPI equation. This calculation has not been validated in all clinical situations. eGFR's persistently <60 mL/min signify possible Chronic Kidney Disease.   . Anion gap 07/15/2016 9  5 - 15 Final  . Lipase 07/15/2016 22  11 - 51 U/L Final  . Troponin I 07/15/2016 <0.03  <0.03 ng/mL Final  . Color, Urine 07/15/2016 STRAW* YELLOW Final  . APPearance 07/15/2016 CLEAR* CLEAR Final  . Specific Gravity, Urine 07/15/2016 1.008  1.005 - 1.030 Final  . pH 07/15/2016 5.0  5.0 - 8.0 Final  . Glucose, UA 07/15/2016 >=500* NEGATIVE mg/dL Final  . Hgb urine dipstick 07/15/2016 SMALL* NEGATIVE Final  . Bilirubin Urine 07/15/2016 NEGATIVE  NEGATIVE Final  . Ketones, ur 07/15/2016 NEGATIVE  NEGATIVE mg/dL Final  . Protein, ur 07/15/2016 NEGATIVE  NEGATIVE mg/dL Final  . Nitrite 07/15/2016 NEGATIVE  NEGATIVE Final  . Leukocytes, UA 07/15/2016 NEGATIVE  NEGATIVE Final  . RBC / HPF 07/15/2016 0-5  0 - 5 RBC/hpf Final  .  WBC, UA  07/15/2016 0-5  0 - 5 WBC/hpf Final  . Bacteria, UA 07/15/2016 NONE SEEN  NONE SEEN Final  . Squamous Epithelial / LPF 07/15/2016 NONE SEEN  NONE SEEN Final  . Specimen Description 07/15/2016 BLOOD RESISTANT ARM   Final  . Special Requests 07/15/2016 BOTTLES DRAWN AEROBIC AND ANAEROBIC Blood Culture results may not be optimal due to an excessive volume of blood received in culture bottles   Final  . Culture 07/15/2016 NO GROWTH 5 DAYS   Final  . Report Status 07/15/2016 07/20/2016 FINAL   Final  . Specimen Description 07/15/2016 BLOOD LEFT [facility]   Final  . Special Requests 07/15/2016 BOTTLES DRAWN AEROBIC AND ANAEROBIC Blood Culture adequate volume   Final  . Culture 07/15/2016 NO GROWTH 5 DAYS   Final  . Report Status 07/15/2016 07/20/2016 FINAL   Final  . Lactic Acid, Venous 07/15/2016 2.2* 0.5 - 1.9 mmol/L Final   Comment: CRITICAL RESULT CALLED TO, READ BACK BY AND VERIFIED WITH AMY COYNE AT 1717 ON 07/15/16 BY [facility]   . Lactic Acid, Venous 07/15/2016 2.7* 0.5 - 1.9 mmol/L Final   Comment: CRITICAL RESULT CALLED TO, READ BACK BY AND VERIFIED WITH TRACEY POOMBS AT 2003 ON 07/15/16 BY [facility]   . Hgb A1c MFr Bld 07/15/2016 6.9* 4.8 - 5.6 % Final   Comment: (NOTE)         Pre-diabetes: 5.7 - 6.4         Diabetes: >6.4         Glycemic control for adults with diabetes: <7.0   . Mean Plasma Glucose 07/15/2016 151  mg/dL Final   Comment: (NOTE) Performed At: [facility] [place], [place] [id number] Lindon Romp MD [id number]   . Hemoglobin 07/15/2016 11.9* 13.0 - 18.0 g/dL Final  . Sodium 07/16/2016 136  135 - 145 mmol/L Final  . Potassium 07/16/2016 4.4  3.5 - 5.1 mmol/L Final  . Chloride 07/16/2016 103  101 - 111 mmol/L Final  . CO2 07/16/2016 25  22 - 32 mmol/L Final  . Glucose, Bld 07/16/2016 187* 65 - 99 mg/dL Final  . BUN 07/16/2016 12  6 - 20 mg/dL Final  . Creatinine, Ser 07/16/2016 0.61  0.61 - 1.24 mg/dL Final  . Calcium 07/16/2016 8.9  8.9 - 10.3  mg/dL Final  . GFR calc non Af Amer 07/16/2016 >60  >60 mL/min Final  . GFR calc Af Amer 07/16/2016 >60  >60 mL/min Final   Comment: (NOTE) The eGFR has been calculated using the CKD EPI equation. This calculation has not been validated in all clinical situations. eGFR's persistently <60 mL/min signify possible Chronic Kidney Disease.   . Anion gap 07/16/2016 8  5 - 15 Final  . WBC 07/16/2016 19.5* 3.8 - 10.6 K/uL Final  . RBC 07/16/2016 4.26* 4.40 - 5.90 MIL/uL Final  . Hemoglobin 07/16/2016 12.0* 13.0 - 18.0 g/dL Final  . HCT 07/16/2016 35.1* 40.0 - 52.0 % Final  . MCV 07/16/2016 82.4  80.0 - 100.0 fL Final  . MCH 07/16/2016 28.1  26.0 - 34.0 pg Final  . MCHC 07/16/2016 34.1  32.0 - 36.0 g/dL Final  . RDW 07/16/2016 16.8* 11.5 - 14.5 % Final  . Platelets 07/16/2016 300  150 - 440 K/uL Final  . Glucose-Capillary 07/15/2016 166* 65 - 99 mg/dL Final  . Glucose-Capillary 07/15/2016 276* 65 - 99 mg/dL Final  . Glucose-Capillary 07/16/2016 143* 65 - 99 mg/dL Final  . Glucose-Capillary 07/16/2016 117*  65 - 99 mg/dL Final  . Glucose-Capillary 07/16/2016 171* 65 - 99 mg/dL Final  . WBC 07/17/2016 21.4* 3.8 - 10.6 K/uL Final  . RBC 07/17/2016 4.37* 4.40 - 5.90 MIL/uL Final  . Hemoglobin 07/17/2016 11.7* 13.0 - 18.0 g/dL Final  . HCT 07/17/2016 35.3* 40.0 - 52.0 % Final  . MCV 07/17/2016 80.6  80.0 - 100.0 fL Final  . MCH 07/17/2016 26.8  26.0 - 34.0 pg Final  . MCHC 07/17/2016 33.2  32.0 - 36.0 g/dL Final  . RDW 07/17/2016 16.8* 11.5 - 14.5 % Final  . Platelets 07/17/2016 327  150 - 440 K/uL Final  . Sodium 07/17/2016 136  135 - 145 mmol/L Final  . Potassium 07/17/2016 4.0  3.5 - 5.1 mmol/L Final  . Chloride 07/17/2016 102  101 - 111 mmol/L Final  . CO2 07/17/2016 27  22 - 32 mmol/L Final  . Glucose, Bld 07/17/2016 146* 65 - 99 mg/dL Final  . BUN 07/17/2016 17  6 - 20 mg/dL Final  . Creatinine, Ser 07/17/2016 0.56* 0.61 - 1.24 mg/dL Final  . Calcium 07/17/2016 8.8* 8.9 - 10.3 mg/dL  Final  . GFR calc non Af Amer 07/17/2016 >60  >60 mL/min Final  . GFR calc Af Amer 07/17/2016 >60  >60 mL/min Final   Comment: (NOTE) The eGFR has been calculated using the CKD EPI equation. This calculation has not been validated in all clinical situations. eGFR's persistently <60 mL/min signify possible Chronic Kidney Disease.   . Anion gap 07/17/2016 7  5 - 15 Final  . Glucose-Capillary 07/16/2016 153* 65 - 99 mg/dL Final  . Glucose-Capillary 07/17/2016 146* 65 - 99 mg/dL Final  . Comment 1 07/17/2016 Notify RN   Final  . Comment 2 07/17/2016 Document in Chart   Final    Assessment:  Bradley [name] is a 66 y.o. male with mild anemia with progressive microcytic indices over the past 2 years.  Diet is good.  He denies any melena, hematochezia or hematuria.  Colonoscopy on 07/01/2012 revealed diverticulosis.  He has never had an EGD.  He has been hospitalized 2 times/year in the past 3 years for pneumonia. He has COPD.  He denies any other infections.  PET scan at [facility] on 07/12/2016 revealed postsurgical changes status post partial lower lobe resection.  There was heterogeneous opacity at the resection site favoring to represent atelectasis with  possible superimposed inflammatory/infectious process. There were other small nodules within the right lower lobe demonstrating minimal activity and stable.  Symptomatically, he denies any B symptoms.  He has a dry cough.  exam is unremarkable.  Plan: 1.  Discuss mild microcytic anemic.  Discuss possible early iron deficiency.  Discuss infections and elevated WBC.  Check SPEP. 2.  Labs today:  CBC with diff, ferritin, iron studies, sed rate, retic, folate, B12, TSH, SPEP, immunoglobulins. 3.  Consider yearly low dose chest CT based on patient's smoking history. 4.  RTC in 1 week for MD assess and review of work-up.   Lequita Asal, MD  08/07/2016, 10:06 AM

## 2016-08-09 LAB — MULTIPLE MYELOMA PANEL, SERUM
Albumin SerPl Elph-Mcnc: 3.8 g/dL (ref 2.9–4.4)
Albumin/Glob SerPl: 1.5 (ref 0.7–1.7)
Alpha 1: 0.2 g/dL (ref 0.0–0.4)
Alpha2 Glob SerPl Elph-Mcnc: 0.8 g/dL (ref 0.4–1.0)
B-Globulin SerPl Elph-Mcnc: 1.1 g/dL (ref 0.7–1.3)
Gamma Glob SerPl Elph-Mcnc: 0.5 g/dL (ref 0.4–1.8)
Globulin, Total: 2.7 g/dL (ref 2.2–3.9)
IgA: 286 mg/dL (ref 61–437)
IgG (Immunoglobin G), Serum: 601 mg/dL — ABNORMAL LOW (ref 700–1600)
IgM, Serum: 77 mg/dL (ref 20–172)
Total Protein ELP: 6.5 g/dL (ref 6.0–8.5)

## 2016-08-14 ENCOUNTER — Encounter: Payer: Self-pay | Admitting: Hematology and Oncology

## 2016-08-14 ENCOUNTER — Inpatient Hospital Stay (HOSPITAL_BASED_OUTPATIENT_CLINIC_OR_DEPARTMENT_OTHER): Payer: PPO | Admitting: Hematology and Oncology

## 2016-08-14 ENCOUNTER — Inpatient Hospital Stay: Payer: PPO

## 2016-08-14 VITALS — BP 104/60 | HR 97 | Temp 98.2°F | Resp 18 | Wt 140.0 lb

## 2016-08-14 DIAGNOSIS — D801 Nonfamilial hypogammaglobulinemia: Secondary | ICD-10-CM

## 2016-08-14 DIAGNOSIS — J449 Chronic obstructive pulmonary disease, unspecified: Secondary | ICD-10-CM | POA: Diagnosis not present

## 2016-08-14 DIAGNOSIS — R0603 Acute respiratory distress: Secondary | ICD-10-CM | POA: Diagnosis not present

## 2016-08-14 DIAGNOSIS — D509 Iron deficiency anemia, unspecified: Secondary | ICD-10-CM

## 2016-08-14 DIAGNOSIS — E538 Deficiency of other specified B group vitamins: Secondary | ICD-10-CM

## 2016-08-14 LAB — URINALYSIS, COMPLETE (UACMP) WITH MICROSCOPIC
Bacteria, UA: NONE SEEN
Bilirubin Urine: NEGATIVE
Glucose, UA: 100 mg/dL — AB
Ketones, ur: NEGATIVE mg/dL
Leukocytes, UA: NEGATIVE
Nitrite: NEGATIVE
Protein, ur: NEGATIVE mg/dL
Specific Gravity, Urine: 1.025 (ref 1.005–1.030)
pH: 5.5 (ref 5.0–8.0)

## 2016-08-14 MED ORDER — FERROUS SULFATE 325 (65 FE) MG PO TBEC: 325.0000 mg | DELAYED_RELEASE_TABLET | Freq: Every day | ORAL | 3 refills | Status: DC

## 2016-08-14 NOTE — Progress Notes (Signed)
Patient here today for lab results.  Offers no complaints today.

## 2016-08-14 NOTE — Progress Notes (Signed)
[facility] day:  08/14/2016  Chief Complaint: Bradley [name] is a 66 y.o. male with anemia who is seen for review of work-up and discussion regarding direction of therapy.  HPI:  The patient was last seen in the hematology clinic on 08/07/2016.  At that time, he was seen for initial consultation.  He had mild anemia with progressive microcytic indices over the past 2 years suggestive of iron deficiency anemia.  Diet  was good.  He denied any melena, hematochezia or hematuria.  Colonoscopy on 07/01/2012 revealed diverticulosis.  He has never had an EGD.  He underwent a work-up.  CBC  revealed a hematocrit of 41.5, hemoglobin 13.7, MCV 81.9, platelets 337,000, white count 9300 with an ANC of 5800.  SPEP was negative. Immunoglobulins revealed an IgG of 601 (700 - 1600).  IgG was 562 on 08/03/2014.  B12 was 319 (low normal).  Folate was 17.6.  Ferritin was 16 (low).  Iron studies revealed a saturation of 24% and TIBC of 399.  Sed rate was 6.  Retic was 1.3%.  TSH was 4.487 (normal).  Symptomatically, he denies any complaints.  He states that he now remembers that he had an EGD about 10 years ago.  He sees Dr. Raul Del for pulmonary issues.   Past Medical History:  Diagnosis Date  . COPD (chronic obstructive pulmonary disease) ([place])   . Coronary artery disease   . Diabetes mellitus without complication ([place])   . Hypertension     Past Surgical History:  Procedure Laterality Date  . LUNG BIOPSY Right    2006  . right side partial lobe removed       Family History  Problem Relation Age of Onset  . Cancer Mother   . Heart attack Father   . Cancer Sister     Social History:  reports that he quit smoking about 8 weeks ago. He smoked 0.50 packs per day. He has never used smokeless tobacco. He reports that he does not drink alcohol or use drugs.  He smoked 1 pack/day since age 13 until 9 weeks ago.  He denies any exposure to radiation or toxins.  He is  retired from working for the [employer].  His wife died 70 months ago with "cancer in the blood" (? leukemia/lymphoma).  The patient is alone today.  Allergies: No Known Allergies  Current Medications: Current Outpatient Prescriptions  Medication Sig Dispense Refill  . albuterol (PROVENTIL) (2.5 MG/3ML) 0.083% nebulizer solution Take 2.5 mg by nebulization every 6 (six) hours as needed for wheezing or shortness of breath.    Marland Kitchen aspirin EC 81 MG tablet Take 81 mg by mouth daily.    . clopidogrel (PLAVIX) 75 MG tablet Take 75 mg by mouth daily.    . enalapril (VASOTEC) 10 MG tablet Take 10 mg by mouth daily.    . Fluticasone-Salmeterol (ADVAIR) 250-50 MCG/DOSE AEPB Inhale 1 puff into the lungs 2 (two) times daily. 60 each 0  . levofloxacin (LEVAQUIN) 500 MG tablet Take 1 tablet (500 mg total) by mouth daily. 5 tablet 0  . metFORMIN (GLUCOPHAGE) 500 MG tablet Take 1 tablet (500 mg total) by mouth 2 (two) times daily with a meal. Takes every morning and then takes another one in the evening if his "sugar starts acting up" (Patient taking differently: Take 500 mg by mouth daily with breakfast. ) 60 tablet 0  . metoprolol (LOPRESSOR) 50 MG tablet Take 50 mg by mouth  2 (two) times daily.    . nicotine (NICODERM CQ - DOSED IN MG/24 HOURS) 21 mg/24hr patch Place 1 patch (21 mg total) onto the skin daily. 28 patch 0  . nitroGLYCERIN (NITROSTAT) 0.4 MG SL tablet Place 0.4 mg under the tongue every 5 (five) minutes as needed for chest pain.    Marland Kitchen nystatin (MYCOSTATIN) 100000 UNIT/ML suspension Take 5 mLs (500,000 Units total) by mouth 4 (four) times daily as needed. 60 mL 0  . predniSONE (DELTASONE) 5 MG tablet 4 tabs po day1; 3 tabs po day2; 2 tabs po day3; 1 tab po day4 10 tablet 0  . PROAIR HFA 108 (90 Base) MCG/ACT inhaler Inhale 2 puffs into the lungs every 6 (six) hours as needed for wheezing or shortness of breath.    . simvastatin (ZOCOR) 20 MG tablet Take 20 mg by mouth at bedtime.     Marland Kitchen  tiotropium (SPIRIVA) 18 MCG inhalation capsule Place 1 capsule (18 mcg total) into inhaler and inhale daily. 30 capsule 0   No current facility-administered medications for this visit.     Review of Systems:  GENERAL:  "No complaints".  No fevers, sweats or weight loss. PERFORMANCE STATUS (ECOG):  0 HEENT:  Congestion due to allergies.  No visual changes, runny nose, sore throat, mouth sores or tenderness. Lungs: No shortness of breath.  Dry cough.  No hemoptysis. Cardiac:  No chest pain, palpitations, orthopnea, or PND. GI:  No nausea, vomiting, diarrhea, constipation, melena or hematochezia. GU:  No urgency, frequency, dysuria, or hematuria. Musculoskeletal:  No back pain.  No joint pain.  No muscle tenderness. Extremities:  No pain or swelling. Skin:  No rashes or skin changes. Neuro:  No headache, numbness or weakness, balance or coordination issues. Endocrine:  No diabetes, thyroid issues, hot flashes or night sweats. Psych:  No mood changes, depression or anxiety. Pain:  No focal pain. Review of systems:  All other systems reviewed and found to be negative.  Physical Exam: Blood pressure 104/60, pulse 97, temperature 98.2 F (36.8 C), temperature source Tympanic, resp. rate 18, weight 139 lb 15.9 oz (63.5 kg). GENERAL:  Thin gentleman sitting comfortably in the exam room in no acute distress. MENTAL STATUS:  Alert and oriented to person, place and time. HEAD:  Pearline Cables hair.  Male pattern baldness.  Goatee.  Normocephalic, atraumatic, face symmetric, no Cushingoid features. EYES:  Blue eyes. No conjunctivitis or scleral icterus. SKIN:  Ecchymosis on arms.  Tan. NEUROLOGICAL: Unremarkable. PSYCH:  Appropriate.   No visits with results within 3 Day(s) from this visit.  Latest known visit with results is:  Office Visit on 08/07/2016  Component Date Value Ref Range Status  . WBC 08/07/2016 9.3  3.8 - 10.6 K/uL Final  . RBC 08/07/2016 5.06  4.40 - 5.90 MIL/uL Final  . Hemoglobin  08/07/2016 13.7  13.0 - 18.0 g/dL Final  . HCT 08/07/2016 41.5  40.0 - 52.0 % Final  . MCV 08/07/2016 81.9  80.0 - 100.0 fL Final  . MCH 08/07/2016 27.1  26.0 - 34.0 pg Final  . MCHC 08/07/2016 33.1  32.0 - 36.0 g/dL Final  . RDW 08/07/2016 17.6* 11.5 - 14.5 % Final  . Platelets 08/07/2016 337  150 - 440 K/uL Final  . Neutrophils Relative % 08/07/2016 62  % Final  . Neutro Abs 08/07/2016 5.8  1.4 - 6.5 K/uL Final  . Lymphocytes Relative 08/07/2016 22  % Final  . Lymphs Abs 08/07/2016 2.1  1.0 - 3.6  K/uL Final  . Monocytes Relative 08/07/2016 10  % Final  . Monocytes Absolute 08/07/2016 0.9  0.2 - 1.0 K/uL Final  . Eosinophils Relative 08/07/2016 5  % Final  . Eosinophils Absolute 08/07/2016 0.4  0 - 0.7 K/uL Final  . Basophils Relative 08/07/2016 1  % Final  . Basophils Absolute 08/07/2016 0.1  0 - 0.1 K/uL Final  . Ferritin 08/07/2016 16* 24 - 336 ng/mL Final  . Iron 08/07/2016 97  45 - 182 ug/dL Final  . TIBC 08/07/2016 399  250 - 450 ug/dL Final  . Saturation Ratios 08/07/2016 24  17.9 - 39.5 % Final  . UIBC 08/07/2016 302  ug/dL Final  . Sed Rate 08/07/2016 6  0 - 20 mm/hr Final  . IgG (Immunoglobin G), Serum 08/07/2016 601* 700 - 1,600 mg/dL Final  . IgA 08/07/2016 286  61 - 437 mg/dL Final  . IgM, Serum 08/07/2016 77  20 - 172 mg/dL Final  . Total Protein ELP 08/07/2016 6.5  6.0 - 8.5 g/dL Corrected  . Albumin SerPl Elph-Mcnc 08/07/2016 3.8  2.9 - 4.4 g/dL Corrected  . Alpha 1 08/07/2016 0.2  0.0 - 0.4 g/dL Corrected  . Alpha2 Glob SerPl Elph-Mcnc 08/07/2016 0.8  0.4 - 1.0 g/dL Corrected  . B-Globulin SerPl Elph-Mcnc 08/07/2016 1.1  0.7 - 1.3 g/dL Corrected  . Gamma Glob SerPl Elph-Mcnc 08/07/2016 0.5  0.4 - 1.8 g/dL Corrected  . M Protein SerPl Elph-Mcnc 08/07/2016 Not Observed  Not Observed g/dL Corrected  . Globulin, Total 08/07/2016 2.7  2.2 - 3.9 g/dL Corrected  . Albumin/Glob SerPl 08/07/2016 1.5  0.7 - 1.7 Corrected  . IFE 1 08/07/2016 Comment   Corrected   An  apparent normal immunofixation pattern.  . Please Note 08/07/2016 Comment   Corrected   Comment: (NOTE) Protein electrophoresis scan will follow via computer, mail, or courier delivery. Performed At: [facility] [place], [place] [id number] Lindon Romp MD NU:[id number]   . Retic Ct Pct 08/07/2016 1.3  0.4 - 3.1 % Final  . RBC. 08/07/2016 4.95  4.40 - 5.90 MIL/uL Final  . Retic Count, Manual 08/07/2016 64.4  19.0 - 183.0 K/uL Final  . Vitamin B-12 08/07/2016 319  180 - 914 pg/mL Final   Comment: (NOTE) This assay is not validated for testing neonatal or myeloproliferative syndrome specimens for Vitamin B12 levels. Performed at [facility], [place] [address], [place], [place] [address]   . Folate 08/07/2016 17.6  >5.9 ng/mL Final  . TSH 08/07/2016 4.487  0.350 - 4.500 uIU/mL Final   Performed by a 3rd Generation assay with a functional sensitivity of <=0.01 uIU/mL.    Assessment:  Bradley [name] is a 66 y.o. male with mild anemia with progressive microcytic indices over the past 2 years.  Diet is good.  He denies any melena, hematochezia or hematuria.  Colonoscopy on 07/01/2012 revealed diverticulosis.  He believes he had an EGD 10 years ago.  Work-up on 08/07/2016 revealed a hematocrit of 41.5, hemoglobin 13.7, MCV 81.9, platelets 337,000, white count 9300 with an Tat [place] of 5800.  Normal studies included:  SPEP, folate, iron saturation (24%), TIBC (399), sed rate, retic (1.3%), TSH.  He has mild hypogammaglobulinemia with an IgG of 601 (700 - 1600).  IgG was 562 on 08/03/2014.  B12 was 319 (low normal).    He has been hospitalized 2 times/year in the past 3 years for pneumonia. He has COPD.  He denies any other infections.  PET scan at [facility] on 07/12/2016 revealed postsurgical changes status post partial lower lobe resection.  There was heterogeneous opacity at the resection site favoring to represent atelectasis with  possible superimposed  inflammatory/infectious process. There were other small nodules within the right lower lobe demonstrating minimal activity and stable.  Symptomatically, he denies any B symptoms.  He has a dry cough.  exam is unremarkable.  Plan: 1.  Discuss work-up to date.  He has iron deficiency. Discuss oral iron.  Discuss checking guaiac cards and urinalysis to r/o blood loss.  Diet appears good. 2.  Discuss low immunoglobulin levels.  SPEP is negative.  Discuss checking 24 hour UPEP and free light chains. 3.  Discuss low normal B12.  Check MMA to r/o B12 deficiency. 4.  Consider yearly low dose chest CT based on patient's smoking history. 5.  Labs today: MMA, free light chains, 24 hour UPEP. 6.  Guaiac cards x 3. 7.  Urinalysis. 8.  RTC in 1 week for MD assess and review of additional testing.   Lequita Asal, MD  08/14/2016, 3:47 PM

## 2016-08-15 LAB — KAPPA/LAMBDA LIGHT CHAINS
Kappa free light chain: 14.1 mg/L (ref 3.3–19.4)
Kappa, lambda light chain ratio: 0.97 (ref 0.26–1.65)
Lambda free light chains: 14.5 mg/L (ref 5.7–26.3)

## 2016-08-16 DIAGNOSIS — F17200 Nicotine dependence, unspecified, uncomplicated: Secondary | ICD-10-CM | POA: Diagnosis not present

## 2016-08-16 DIAGNOSIS — E119 Type 2 diabetes mellitus without complications: Secondary | ICD-10-CM | POA: Diagnosis not present

## 2016-08-16 DIAGNOSIS — R918 Other nonspecific abnormal finding of lung field: Secondary | ICD-10-CM | POA: Diagnosis not present

## 2016-08-16 DIAGNOSIS — J439 Emphysema, unspecified: Secondary | ICD-10-CM | POA: Diagnosis not present

## 2016-08-16 LAB — METHYLMALONIC ACID, SERUM: Methylmalonic Acid, Quantitative: 196 nmol/L (ref 0–378)

## 2016-08-19 ENCOUNTER — Inpatient Hospital Stay: Payer: PPO

## 2016-08-19 DIAGNOSIS — D509 Iron deficiency anemia, unspecified: Secondary | ICD-10-CM

## 2016-08-19 DIAGNOSIS — D801 Nonfamilial hypogammaglobulinemia: Secondary | ICD-10-CM

## 2016-08-19 LAB — OCCULT BLOOD X 1 CARD TO LAB, STOOL
Fecal Occult Bld: NEGATIVE
Fecal Occult Bld: NEGATIVE
Fecal Occult Bld: NEGATIVE

## 2016-08-20 LAB — IFE+PROTEIN ELECTRO, 24-HR UR
% BETA, Urine: 32.5 %
ALPHA 1 URINE: 4.2 %
Albumin, U: 19.9 %
Alpha 2, Urine: 16.6 %
GAMMA GLOBULIN URINE: 26.8 %
Total Protein, Urine-Ur/day: 112 mg/24 hr (ref 30–150)
Total Protein, Urine: 6.6 mg/dL
Total Volume: 1700

## 2016-08-21 ENCOUNTER — Inpatient Hospital Stay (HOSPITAL_BASED_OUTPATIENT_CLINIC_OR_DEPARTMENT_OTHER): Payer: PPO | Admitting: Hematology and Oncology

## 2016-08-21 ENCOUNTER — Encounter: Payer: Self-pay | Admitting: Hematology and Oncology

## 2016-08-21 VITALS — BP 106/67 | HR 76 | Temp 96.8°F | Resp 18 | Wt 141.8 lb

## 2016-08-21 DIAGNOSIS — E538 Deficiency of other specified B group vitamins: Secondary | ICD-10-CM | POA: Diagnosis not present

## 2016-08-21 DIAGNOSIS — D500 Iron deficiency anemia secondary to blood loss (chronic): Secondary | ICD-10-CM | POA: Diagnosis not present

## 2016-08-21 DIAGNOSIS — R823 Hemoglobinuria: Secondary | ICD-10-CM

## 2016-08-21 DIAGNOSIS — D509 Iron deficiency anemia, unspecified: Secondary | ICD-10-CM | POA: Diagnosis not present

## 2016-08-21 DIAGNOSIS — Z79899 Other long term (current) drug therapy: Secondary | ICD-10-CM

## 2016-08-21 DIAGNOSIS — D801 Nonfamilial hypogammaglobulinemia: Secondary | ICD-10-CM

## 2016-08-21 NOTE — Progress Notes (Signed)
[facility] day:  08/21/2016  Chief Complaint: Bradley [name] is a 66 y.o. male with anemia who is seen for 1 week assessment and review of additional testing.  HPI:  The patient was last seen in the hematology clinic on 08/14/2016.  At that time, screening labs revealed iron deficiency (ferritin16),  hypogammaglobulinemia and a low B12 level.  Additional testing was performed.  Methymalonic acid was 196 (normal) r/o B12 deficiency.  Kappa free light chains were 14.1, lambda free light chains 14.5 and ratio 0.97 (normal). Urinalysis revealed trace hemoglobin.  One month prior urine hemoglobin was small.  24 hour UPEP revealed 112 mg/24 hours (30-150).  Immunofixation was normal.  Guaiac cards were negative x 3.  Symptomatically, patient reports that he is feeling "better" overall.  He is taking ferrous sulfate 325 mg po q day.   Past Medical History:  Diagnosis Date  . COPD (chronic obstructive pulmonary disease) ([place])   . Coronary artery disease   . Diabetes mellitus without complication ([place])   . Hypertension     Past Surgical History:  Procedure Laterality Date  . LUNG BIOPSY Right    2006  . right side partial lobe removed       Family History  Problem Relation Age of Onset  . Cancer Mother   . Heart attack Father   . Cancer Sister     Social History:  reports that he quit smoking about 2 months ago. He smoked 0.50 packs per day. He has never used smokeless tobacco. He reports that he does not drink alcohol or use drugs.  He smoked 1 pack/day since age 15 until 9 weeks ago.  He denies any exposure to radiation or toxins.  He is retired from working for the [employer].  His wife died 36 months ago with "cancer in the blood" (? leukemia/lymphoma).  The patient is alone today.  Allergies: No Known Allergies  Current Medications: Current Outpatient Prescriptions  Medication Sig Dispense Refill  . albuterol (PROVENTIL) (2.5  MG/3ML) 0.083% nebulizer solution Take 2.5 mg by nebulization every 6 (six) hours as needed for wheezing or shortness of breath.    Marland Kitchen aspirin EC 81 MG tablet Take 81 mg by mouth daily.    . clopidogrel (PLAVIX) 75 MG tablet Take 75 mg by mouth daily.    . enalapril (VASOTEC) 10 MG tablet Take 10 mg by mouth daily.    . ferrous sulfate 325 (65 FE) MG EC tablet Take 1 tablet (325 mg total) by mouth daily. 30 tablet 3  . Fluticasone-Salmeterol (ADVAIR) 250-50 MCG/DOSE AEPB Inhale 1 puff into the lungs 2 (two) times daily. 60 each 0  . levofloxacin (LEVAQUIN) 500 MG tablet Take 1 tablet (500 mg total) by mouth daily. 5 tablet 0  . metFORMIN (GLUCOPHAGE) 500 MG tablet Take 1 tablet (500 mg total) by mouth 2 (two) times daily with a meal. Takes every morning and then takes another one in the evening if his "sugar starts acting up" (Patient taking differently: Take 500 mg by mouth daily with breakfast. ) 60 tablet 0  . metoprolol (LOPRESSOR) 50 MG tablet Take 50 mg by mouth 2 (two) times daily.    . nicotine (NICODERM CQ - DOSED IN MG/24 HOURS) 21 mg/24hr patch Place 1 patch (21 mg total) onto the skin daily. 28 patch 0  . nitroGLYCERIN (NITROSTAT) 0.4 MG SL tablet Place 0.4 mg under the tongue every 5 (five) minutes as  needed for chest pain.    Marland Kitchen nystatin (MYCOSTATIN) 100000 UNIT/ML suspension Take 5 mLs (500,000 Units total) by mouth 4 (four) times daily as needed. 60 mL 0  . predniSONE (DELTASONE) 5 MG tablet 4 tabs po day1; 3 tabs po day2; 2 tabs po day3; 1 tab po day4 10 tablet 0  . PROAIR HFA 108 (90 Base) MCG/ACT inhaler Inhale 2 puffs into the lungs every 6 (six) hours as needed for wheezing or shortness of breath.    . simvastatin (ZOCOR) 20 MG tablet Take 20 mg by mouth at bedtime.     Marland Kitchen tiotropium (SPIRIVA) 18 MCG inhalation capsule Place 1 capsule (18 mcg total) into inhaler and inhale daily. 30 capsule 0   No current facility-administered medications for this visit.     Review of Systems:   GENERAL:  "No complaints".  No fevers or sweats.  Weight up 2 pounds. PERFORMANCE STATUS (ECOG):  0 HEENT:  Allergies.  No visual changes, runny nose, sore throat, mouth sores or tenderness. Lungs: No shortness of breath.  Dry cough.  No hemoptysis. Cardiac:  No chest pain, palpitations, orthopnea, or PND. GI:  No nausea, vomiting, diarrhea, constipation, melena or hematochezia. GU:  No urgency, frequency, dysuria, or hematuria. Musculoskeletal:  No back pain.  No joint pain.  No muscle tenderness. Extremities:  No pain or swelling. Skin:  No rashes or skin changes. Neuro:  No headache, numbness or weakness, balance or coordination issues. Endocrine:  No diabetes, thyroid issues, hot flashes or night sweats. Psych:  No mood changes, depression or anxiety. Pain:  No focal pain. Review of systems:  All other systems reviewed and found to be negative.  Physical Exam: Blood pressure 106/67, pulse 76, temperature (!) 96.8 F (36 C), temperature source Tympanic, resp. rate 18, weight 141 lb 12.1 oz (64.3 kg). GENERAL:  Thin gentleman sitting comfortably in the exam room in no acute distress. MENTAL STATUS:  Alert and oriented to person, place and time. HEAD:  Pearline Cables hair.  Male pattern baldness.  Goatee.  Normocephalic, atraumatic, face symmetric, no Cushingoid features. EYES:  Blue eyes. No conjunctivitis or scleral icterus. SKIN:  Ecchymosis on arms.  Tan. NEUROLOGICAL: Unremarkable. PSYCH:  Appropriate.   Appointment on 08/19/2016  Component Date Value Ref Range Status  . Fecal Occult Bld 08/19/2016 NEGATIVE  NEGATIVE Final  . Fecal Occult Bld 08/19/2016 NEGATIVE  NEGATIVE Final  . Fecal Occult Bld 08/19/2016 NEGATIVE  NEGATIVE Final  . Total Protein, Urine 08/19/2016 6.6  Not Estab. mg/dL Final  . Total Protein, Urine-Ur/day 08/19/2016 112  30 - 150 mg/24 hr Final  . Albumin, U 08/19/2016 19.9  % Final  . ALPHA 1 URINE 08/19/2016 4.2  % Final  . Alpha 2, Urine 08/19/2016 16.6  %  Final  . % BETA, Urine 08/19/2016 32.5  % Final  . GAMMA GLOBULIN URINE 08/19/2016 26.8  % Final  . M-SPIKE %, Urine 08/19/2016 Not Observed  Not Observed % Final  . Immunofixation Result, Urine 08/19/2016 Comment   Final   An apparent normal immunofixation pattern.  . Note: 08/19/2016 Comment   Final   Comment: (NOTE) Protein electrophoresis scan will follow via computer, mail, or courier delivery. Performed At: [facility] [place], [place] [id number] Lindon Romp MD [id number]   . Total Volume 08/19/2016 1700   Final    Assessment:  Bradley [name] is a 66 y.o. male with mild anemia with progressive microcytic indices over the past 2  years.  Diet is good.  He denies any melena, hematochezia or hematuria.  Colonoscopy on 07/01/2012 revealed diverticulosis.  He believes he had an EGD 10 years ago.  Work-up on 08/07/2016 revealed a hematocrit of 41.5, hemoglobin 13.7, MCV 81.9, platelets 337,000, white count 9300 with an [place] of 5800.  Normal studies included:  SPEP, folate, iron saturation (24%), TIBC (399), sed rate, retic (1.3%), TSH.  He has mild hypogammaglobulinemia with an IgG of 601 (700 - 1600).  IgG was 562 on 08/03/2014.  Ferritin was 16 (low).  B12 was 319 (low normal) with a normal MMA r/o B12 deficiency.  Free light chains were normal. 24 hour UPEP revealed 112 mg/24 hours (30-150).  Immunofixation was normal.  Urinalysis revealed trace hemoglobin.  One month prior urine hemoglobin was small.  Guaiac cards were negative x 3. Patient currently on Ferrous Sulfate supplementation; not taking with Vitamin C. Discussed dietary recommendations.   He has been hospitalized 2 times/year in the past 3 years for pneumonia. He has COPD.  He denies any other infections.  PET scan at [facility] on 07/12/2016 revealed postsurgical changes status post partial lower lobe resection.  There was heterogeneous opacity at the resection site favoring to represent atelectasis with   possible superimposed inflammatory/infectious process. There were other small nodules within the right lower lobe demonstrating minimal activity and stable.  Symptomatically, he denies any B symptoms.  Exam is unremarkable.  Plan: 1.  Discuss interim labs.  No evidence of B12 deficiency.  He has iron deficiency with urinalysis revealing trace to small hemoglobin.  Discuss referral to nephrology. 2.  Discuss low immunoglobulin levels.  SPEP, free light chains, and 24 hour UPEP are negative. 3.  Continue yearly low dose chest CT based on patient's smoking history last done at [facility] in April/May 2018.  4.  Referral to nephrology (Dr Holley Raring) re: hemoglobinuria. 5.  Continue oral iron supplementation. 6.  RTC in 3 months for  labs (CBC with diff, ferritin). 7.  RTC in 6 months for MD assessment and labs (CBC with diff, ferritin).    Lequita Asal, MD  08/21/2016, 7:32 PM

## 2016-08-21 NOTE — Progress Notes (Signed)
Patient offers no complaints today. 

## 2016-08-26 ENCOUNTER — Telehealth: Payer: Self-pay | Admitting: *Deleted

## 2016-08-26 DIAGNOSIS — Z87891 Personal history of nicotine dependence: Secondary | ICD-10-CM

## 2016-08-26 NOTE — Telephone Encounter (Signed)
Received referral for initial lung cancer screening scan. Contacted patient and obtained smoking history,(former, quit 06/02/16, 52 pack year) as well as answering questions related to screening process. Patient denies signs of lung cancer such as weight loss or hemoptysis. Patient denies comorbidity that would prevent curative treatment if lung cancer were found. Patient is scheduled for shared decision making visit and CT scan on 09/03/16.

## 2016-09-03 ENCOUNTER — Inpatient Hospital Stay: Payer: PPO | Attending: Oncology | Admitting: Oncology

## 2016-09-03 ENCOUNTER — Ambulatory Visit
Admission: RE | Admit: 2016-09-03 | Discharge: 2016-09-03 | Disposition: A | Discharge status: Home / Self Care | Payer: PPO | Source: Ambulatory Visit | Attending: Oncology | Admitting: Oncology

## 2016-09-03 ENCOUNTER — Encounter: Payer: Self-pay | Admitting: Oncology

## 2016-09-03 DIAGNOSIS — Z122 Encounter for screening for malignant neoplasm of respiratory organs: Secondary | ICD-10-CM

## 2016-09-03 DIAGNOSIS — I700 Atherosclerosis of aorta: Secondary | ICD-10-CM | POA: Diagnosis not present

## 2016-09-03 DIAGNOSIS — J439 Emphysema, unspecified: Secondary | ICD-10-CM | POA: Diagnosis not present

## 2016-09-03 DIAGNOSIS — Z87891 Personal history of nicotine dependence: Secondary | ICD-10-CM

## 2016-09-03 DIAGNOSIS — I2510 Atherosclerotic heart disease of native coronary artery without angina pectoris: Secondary | ICD-10-CM | POA: Diagnosis not present

## 2016-09-03 NOTE — Progress Notes (Signed)
In accordance with CMS guidelines, patient has met eligibility criteria including age, absence of signs or symptoms of lung cancer.  Social History  Substance Use Topics  . Smoking status: Former Smoker    Packs/day: 1.00    Years: 52.00    Quit date: 06/18/2016  . Smokeless tobacco: Never Used  . Alcohol use No     A shared decision-making session was conducted prior to the performance of CT scan. This includes one or more decision aids, includes benefits and harms of screening, follow-up diagnostic testing, over-diagnosis, false positive rate, and total radiation exposure.  Counseling on the importance of adherence to annual lung cancer LDCT screening, impact of co-morbidities, and ability or willingness to undergo diagnosis and treatment is imperative for compliance of the program.  Counseling on the importance of continued smoking cessation for former smokers; the importance of smoking cessation for current smokers, and information about tobacco cessation interventions have been given to patient including [place] and [facility] quit [place] programs.  Written order for lung cancer screening with LDCT has been given to the patient and any and all questions have been answered to the best of my abilities.   Yearly follow up will be coordinated by Burgess Estelle, Thoracic Navigator.  Faythe Casa, NP 09/03/2016 12:56 PM

## 2016-09-06 ENCOUNTER — Encounter: Payer: Self-pay | Admitting: *Deleted

## 2016-09-13 DIAGNOSIS — R0603 Acute respiratory distress: Secondary | ICD-10-CM | POA: Diagnosis not present

## 2016-09-13 DIAGNOSIS — J449 Chronic obstructive pulmonary disease, unspecified: Secondary | ICD-10-CM | POA: Diagnosis not present

## 2016-10-08 DIAGNOSIS — E782 Mixed hyperlipidemia: Secondary | ICD-10-CM | POA: Diagnosis not present

## 2016-10-08 DIAGNOSIS — D638 Anemia in other chronic diseases classified elsewhere: Secondary | ICD-10-CM | POA: Diagnosis not present

## 2016-10-08 DIAGNOSIS — I10 Essential (primary) hypertension: Secondary | ICD-10-CM | POA: Diagnosis not present

## 2016-10-08 DIAGNOSIS — J441 Chronic obstructive pulmonary disease with (acute) exacerbation: Secondary | ICD-10-CM | POA: Diagnosis not present

## 2016-10-08 DIAGNOSIS — E119 Type 2 diabetes mellitus without complications: Secondary | ICD-10-CM | POA: Diagnosis not present

## 2016-10-14 DIAGNOSIS — J449 Chronic obstructive pulmonary disease, unspecified: Secondary | ICD-10-CM | POA: Diagnosis not present

## 2016-10-14 DIAGNOSIS — R0603 Acute respiratory distress: Secondary | ICD-10-CM | POA: Diagnosis not present

## 2016-10-28 DIAGNOSIS — R0609 Other forms of dyspnea: Secondary | ICD-10-CM | POA: Diagnosis not present

## 2016-10-28 DIAGNOSIS — J439 Emphysema, unspecified: Secondary | ICD-10-CM | POA: Diagnosis not present

## 2016-11-14 DIAGNOSIS — R0603 Acute respiratory distress: Secondary | ICD-10-CM | POA: Diagnosis not present

## 2016-11-14 DIAGNOSIS — J449 Chronic obstructive pulmonary disease, unspecified: Secondary | ICD-10-CM | POA: Diagnosis not present

## 2016-11-19 ENCOUNTER — Inpatient Hospital Stay: Payer: PPO | Attending: Hematology and Oncology

## 2016-11-19 DIAGNOSIS — D649 Anemia, unspecified: Secondary | ICD-10-CM | POA: Insufficient documentation

## 2016-11-19 DIAGNOSIS — E119 Type 2 diabetes mellitus without complications: Secondary | ICD-10-CM | POA: Insufficient documentation

## 2016-11-19 DIAGNOSIS — Z7982 Long term (current) use of aspirin: Secondary | ICD-10-CM | POA: Insufficient documentation

## 2016-11-19 DIAGNOSIS — J440 Chronic obstructive pulmonary disease with acute lower respiratory infection: Secondary | ICD-10-CM | POA: Diagnosis not present

## 2016-11-19 DIAGNOSIS — I2510 Atherosclerotic heart disease of native coronary artery without angina pectoris: Secondary | ICD-10-CM | POA: Diagnosis not present

## 2016-11-19 DIAGNOSIS — Z79899 Other long term (current) drug therapy: Secondary | ICD-10-CM | POA: Diagnosis not present

## 2016-11-19 DIAGNOSIS — D509 Iron deficiency anemia, unspecified: Secondary | ICD-10-CM | POA: Diagnosis not present

## 2016-11-19 DIAGNOSIS — E538 Deficiency of other specified B group vitamins: Secondary | ICD-10-CM | POA: Diagnosis not present

## 2016-11-19 DIAGNOSIS — D801 Nonfamilial hypogammaglobulinemia: Secondary | ICD-10-CM | POA: Insufficient documentation

## 2016-11-19 DIAGNOSIS — Z87891 Personal history of nicotine dependence: Secondary | ICD-10-CM | POA: Insufficient documentation

## 2016-11-19 DIAGNOSIS — I10 Essential (primary) hypertension: Secondary | ICD-10-CM | POA: Insufficient documentation

## 2016-11-19 DIAGNOSIS — Z7984 Long term (current) use of oral hypoglycemic drugs: Secondary | ICD-10-CM | POA: Insufficient documentation

## 2016-11-19 DIAGNOSIS — R823 Hemoglobinuria: Secondary | ICD-10-CM

## 2016-11-19 DIAGNOSIS — D500 Iron deficiency anemia secondary to blood loss (chronic): Secondary | ICD-10-CM

## 2016-11-19 LAB — CBC WITH DIFFERENTIAL/PLATELET
Basophils Absolute: 0.1 10*3/uL (ref 0–0.1)
Basophils Relative: 1 %
Eosinophils Absolute: 0.1 10*3/uL (ref 0–0.7)
Eosinophils Relative: 2 %
HCT: 45.1 % (ref 40.0–52.0)
Hemoglobin: 15.1 g/dL (ref 13.0–18.0)
Lymphocytes Relative: 24 %
Lymphs Abs: 2.3 10*3/uL (ref 1.0–3.6)
MCH: 29 pg (ref 26.0–34.0)
MCHC: 33.5 g/dL (ref 32.0–36.0)
MCV: 86.5 fL (ref 80.0–100.0)
Monocytes Absolute: 1 10*3/uL (ref 0.2–1.0)
Monocytes Relative: 10 %
Neutro Abs: 5.9 10*3/uL (ref 1.4–6.5)
Neutrophils Relative %: 63 %
Platelets: 286 10*3/uL (ref 150–440)
RBC: 5.21 MIL/uL (ref 4.40–5.90)
RDW: 15.3 % — ABNORMAL HIGH (ref 11.5–14.5)
WBC: 9.4 10*3/uL (ref 3.8–10.6)

## 2016-11-19 LAB — FERRITIN: Ferritin: 30 ng/mL (ref 24–336)

## 2016-11-20 ENCOUNTER — Other Ambulatory Visit: Payer: PPO

## 2016-12-10 DIAGNOSIS — Z23 Encounter for immunization: Secondary | ICD-10-CM | POA: Diagnosis not present

## 2016-12-14 DIAGNOSIS — R0603 Acute respiratory distress: Secondary | ICD-10-CM | POA: Diagnosis not present

## 2016-12-14 DIAGNOSIS — J449 Chronic obstructive pulmonary disease, unspecified: Secondary | ICD-10-CM | POA: Diagnosis not present

## 2017-01-14 DIAGNOSIS — J449 Chronic obstructive pulmonary disease, unspecified: Secondary | ICD-10-CM | POA: Diagnosis not present

## 2017-01-14 DIAGNOSIS — R0603 Acute respiratory distress: Secondary | ICD-10-CM | POA: Diagnosis not present

## 2017-02-13 DIAGNOSIS — J449 Chronic obstructive pulmonary disease, unspecified: Secondary | ICD-10-CM | POA: Diagnosis not present

## 2017-02-13 DIAGNOSIS — R0603 Acute respiratory distress: Secondary | ICD-10-CM | POA: Diagnosis not present

## 2017-02-18 DIAGNOSIS — E119 Type 2 diabetes mellitus without complications: Secondary | ICD-10-CM | POA: Diagnosis not present

## 2017-02-18 DIAGNOSIS — I10 Essential (primary) hypertension: Secondary | ICD-10-CM | POA: Diagnosis not present

## 2017-02-18 DIAGNOSIS — M6208 Separation of muscle (nontraumatic), other site: Secondary | ICD-10-CM | POA: Diagnosis not present

## 2017-02-18 DIAGNOSIS — E782 Mixed hyperlipidemia: Secondary | ICD-10-CM | POA: Diagnosis not present

## 2017-02-18 DIAGNOSIS — J438 Other emphysema: Secondary | ICD-10-CM | POA: Diagnosis not present

## 2017-02-18 DIAGNOSIS — Z23 Encounter for immunization: Secondary | ICD-10-CM | POA: Insufficient documentation

## 2017-02-18 DIAGNOSIS — K429 Umbilical hernia without obstruction or gangrene: Secondary | ICD-10-CM | POA: Diagnosis not present

## 2017-02-18 DIAGNOSIS — R200 Anesthesia of skin: Secondary | ICD-10-CM | POA: Diagnosis not present

## 2017-02-18 DIAGNOSIS — J441 Chronic obstructive pulmonary disease with (acute) exacerbation: Secondary | ICD-10-CM | POA: Diagnosis not present

## 2017-02-18 DIAGNOSIS — I2510 Atherosclerotic heart disease of native coronary artery without angina pectoris: Secondary | ICD-10-CM | POA: Diagnosis not present

## 2017-02-19 ENCOUNTER — Other Ambulatory Visit: Payer: PPO

## 2017-02-19 ENCOUNTER — Ambulatory Visit: Payer: PPO | Admitting: Hematology and Oncology

## 2017-02-27 DIAGNOSIS — R0609 Other forms of dyspnea: Secondary | ICD-10-CM | POA: Diagnosis not present

## 2017-02-27 DIAGNOSIS — J449 Chronic obstructive pulmonary disease, unspecified: Secondary | ICD-10-CM | POA: Diagnosis not present

## 2017-03-04 NOTE — Progress Notes (Deleted)
[facility] day:  03/04/2017  Chief Complaint: Bradley [name] is a 67 y.o. male with iron deficiency anemia who is seen for 6 month assessment.   HPI:  The patient was last seen in the hematology clinic on 08/21/2016.  At that time, patient was feeling "better" overall. He denied any acute physical complaints. He was taken oral iron daily as recommended.  Patient return to the clinic on 11/19/2016 for repeat labs.  CBC demonstrated WBC of 9400 with an [place] of 5900. Hemoglobin 15.1, hematocrit 45.1, MCV 86.5, MCH 29.0, platelets 286,000. Ferritin was 30. He was advised to continue his oral iron with vitamin C supplement daily.  Patient had a low dose chest CT lung screening on 09/03/2016. CT scan demonstrated left main three-vessel coronary atherosclerosis. There were small mediastinal lymph nodes that did not meet pathologic CT size criteria. There were scattered bilateral pulmonary nodules measuring up to 5.3 mm noted in the RIGHT middle lobe. Additionally, there was a 6.3 mm calcified granuloma in the posterior LEFT upper lobe. Lung- RADS 2S, suggesting a benign appearance her behavior. Recommendations were for continued annual screening with low dose chest CT without contrast.  Symptomatically,   Past Medical History:  Diagnosis Date  . COPD (chronic obstructive pulmonary disease) ([place])   . Coronary artery disease   . Diabetes mellitus without complication ([place])   . Hypertension     Past Surgical History:  Procedure Laterality Date  . LUNG BIOPSY Right    2006  . right side partial lobe removed       Family History  Problem Relation Age of Onset  . Cancer Mother   . Heart attack Father   . Cancer Sister     Social History:  reports that he quit smoking about 8 months ago. He has a 52.00 pack-year smoking history. he has never used smokeless tobacco. He reports that he does not drink alcohol or use drugs.  He smoked 1 pack/day since age  66 until 9 weeks ago.  He denies any exposure to radiation or toxins.  He is retired from working for the [employer].  His wife died 77 months ago with "cancer in the blood" (? leukemia/lymphoma).  The patient is alone today.  Allergies: No Known Allergies  Current Medications: Current Outpatient Medications  Medication Sig Dispense Refill  . albuterol (PROVENTIL) (2.5 MG/3ML) 0.083% nebulizer solution Take 2.5 mg by nebulization every 6 (six) hours as needed for wheezing or shortness of breath.    Marland Kitchen aspirin EC 81 MG tablet Take 81 mg by mouth daily.    . clopidogrel (PLAVIX) 75 MG tablet Take 75 mg by mouth daily.    . enalapril (VASOTEC) 10 MG tablet Take 10 mg by mouth daily.    . ferrous sulfate 325 (65 FE) MG EC tablet Take 1 tablet (325 mg total) by mouth daily. 30 tablet 3  . Fluticasone-Salmeterol (ADVAIR) 250-50 MCG/DOSE AEPB Inhale 1 puff into the lungs 2 (two) times daily. 60 each 0  . levofloxacin (LEVAQUIN) 500 MG tablet Take 1 tablet (500 mg total) by mouth daily. 5 tablet 0  . metFORMIN (GLUCOPHAGE) 500 MG tablet Take 1 tablet (500 mg total) by mouth 2 (two) times daily with a meal. Takes every morning and then takes another one in the evening if his "sugar starts acting up" (Patient taking differently: Take 500 mg by mouth daily with breakfast. ) 60 tablet 0  . metoprolol (LOPRESSOR)  50 MG tablet Take 50 mg by mouth 2 (two) times daily.    . nicotine (NICODERM CQ - DOSED IN MG/24 HOURS) 21 mg/24hr patch Place 1 patch (21 mg total) onto the skin daily. 28 patch 0  . nitroGLYCERIN (NITROSTAT) 0.4 MG SL tablet Place 0.4 mg under the tongue every 5 (five) minutes as needed for chest pain.    Marland Kitchen nystatin (MYCOSTATIN) 100000 UNIT/ML suspension Take 5 mLs (500,000 Units total) by mouth 4 (four) times daily as needed. 60 mL 0  . predniSONE (DELTASONE) 5 MG tablet 4 tabs po day1; 3 tabs po day2; 2 tabs po day3; 1 tab po day4 10 tablet 0  . PROAIR HFA 108 (90 Base) MCG/ACT inhaler Inhale  2 puffs into the lungs every 6 (six) hours as needed for wheezing or shortness of breath.    . simvastatin (ZOCOR) 20 MG tablet Take 20 mg by mouth at bedtime.     Marland Kitchen tiotropium (SPIRIVA) 18 MCG inhalation capsule Place 1 capsule (18 mcg total) into inhaler and inhale daily. 30 capsule 0   No current facility-administered medications for this visit.     Review of Systems:  GENERAL:  "No complaints".  No fevers or sweats.  Weight up 2 pounds. PERFORMANCE STATUS (ECOG):  0 HEENT:  Allergies.  No visual changes, runny nose, sore throat, mouth sores or tenderness. Lungs: No shortness of breath.  Dry cough.  No hemoptysis. Cardiac:  No chest pain, palpitations, orthopnea, or PND. GI:  No nausea, vomiting, diarrhea, constipation, melena or hematochezia. GU:  No urgency, frequency, dysuria, or hematuria. Musculoskeletal:  No back pain.  No joint pain.  No muscle tenderness. Extremities:  No pain or swelling. Skin:  No rashes or skin changes. Neuro:  No headache, numbness or weakness, balance or coordination issues. Endocrine:  No diabetes, thyroid issues, hot flashes or night sweats. Psych:  No mood changes, depression or anxiety. Pain:  No focal pain. Review of systems:  All other systems reviewed and found to be negative.  Physical Exam: There were no vitals taken for this visit. GENERAL:  Thin gentleman sitting comfortably in the exam room in no acute distress. MENTAL STATUS:  Alert and oriented to person, place and time. HEAD:  Pearline Cables hair.  Male pattern baldness.  Goatee.  Normocephalic, atraumatic, face symmetric, no Cushingoid features. EYES:  Blue eyes.  Pupils equal round and reactive to light and accomodation.  No conjunctivitis or scleral icterus. ENT:  Oropharynx clear without lesion.  Tongue normal. Mucous membranes moist.  RESPIRATORY:  Clear to auscultation without rales, wheezes or rhonchi. CARDIOVASCULAR:  Regular rate and rhythm without murmur, rub or gallop. ABDOMEN:  Soft,  non-tender, with active bowel sounds, and no hepatosplenomegaly.  No masses. SKIN:  Ecchymosis on arms.  Tan.  No rashes, ulcers or lesions. EXTREMITIES: No edema, no skin discoloration or tenderness.  No palpable cords. LYMPH NODES: No palpable cervical, supraclavicular, axillary or inguinal adenopathy  NEUROLOGICAL: Unremarkable. PSYCH:  Appropriate.   No visits with results within 3 Day(s) from this visit.  Latest known visit with results is:  Appointment on 11/19/2016  Component Date Value Ref Range Status  . WBC 11/19/2016 9.4  3.8 - 10.6 K/uL Final  . RBC 11/19/2016 5.21  4.40 - 5.90 MIL/uL Final  . Hemoglobin 11/19/2016 15.1  13.0 - 18.0 g/dL Final  . HCT 11/19/2016 45.1  40.0 - 52.0 % Final  . MCV 11/19/2016 86.5  80.0 - 100.0 fL Final  . [facility] 11/19/2016  29.0  26.0 - 34.0 pg Final  . MCHC 11/19/2016 33.5  32.0 - 36.0 g/dL Final  . RDW 11/19/2016 15.3* 11.5 - 14.5 % Final  . Platelets 11/19/2016 286  150 - 440 K/uL Final  . Neutrophils Relative % 11/19/2016 63  % Final  . Neutro Abs 11/19/2016 5.9  1.4 - 6.5 K/uL Final  . Lymphocytes Relative 11/19/2016 24  % Final  . Lymphs Abs 11/19/2016 2.3  1.0 - 3.6 K/uL Final  . Monocytes Relative 11/19/2016 10  % Final  . Monocytes Absolute 11/19/2016 1.0  0.2 - 1.0 K/uL Final  . Eosinophils Relative 11/19/2016 2  % Final  . Eosinophils Absolute 11/19/2016 0.1  0 - 0.7 K/uL Final  . Basophils Relative 11/19/2016 1  % Final  . Basophils Absolute 11/19/2016 0.1  0 - 0.1 K/uL Final  . Ferritin 11/19/2016 30  24 - 336 ng/mL Final    Assessment:  Bradley [name] is a 67 y.o. male with mild anemia with progressive microcytic indices over the past 2 years.  Diet is good.  He denies any melena, hematochezia or hematuria.  Colonoscopy on 07/01/2012 revealed diverticulosis.  He believes he had an EGD 10 years ago.  Work-up on 08/07/2016 revealed a hematocrit of 41.5, hemoglobin 13.7, MCV 81.9, platelets 337,000, white count 9300 with an [place] of  5800.  Normal studies included:  SPEP, folate, iron saturation (24%), TIBC (399), sed rate, retic (1.3%), TSH.  He has mild hypogammaglobulinemia with an IgG of 601 (700 - 1600).  IgG was 562 on 08/03/2014.  Ferritin was 16 (low).  B12 was 319 (low normal) with a normal MMA r/o B12 deficiency.  Free light chains were normal. 24 hour UPEP revealed 112 mg/24 hours (30-150).  Immunofixation was normal.  Urinalysis revealed trace hemoglobin.  One month prior urine hemoglobin was small.  Guaiac cards were negative x 3. Patient currently on Ferrous Sulfate supplementation; not taking with Vitamin C. Discussed dietary recommendations.   Ferritin has been followed: 16 on 08/07/2016 and 30 on 11/19/2016.  He has been hospitalized 2 times/year in the past 3 years for pneumonia. He has COPD.  He denies any other infections.  [facility] scan at [facility] on 07/12/2016 revealed postsurgical changes status post partial lower lobe resection.  There was heterogeneous opacity at the resection site favoring to represent atelectasis with  possible superimposed inflammatory/infectious process. There were other small nodules within the right lower lobe demonstrating minimal activity and stable.  Low dose chest CT lung screening on 09/03/2016 demonstrated small mediastinal lymph nodes that did not meet pathologic CT size criteria. There were scattered bilateral pulmonary nodules measuring up to 5.3 mm noted in the RIGHT middle lobe. Additionally, there was a 6.3 mm calcified granuloma in the posterior LEFT upper lobe. Recommendations were for continued annual screening with low dose chest CT without contrast.  Symptomatically, he denies any B symptoms.  Exam is unremarkable.  Plan: 1. Labs today: CBC with differential, ferritin, IgG. 2. Discuss low dose chest CT done in July 2018. Scattered pulmonary nodules (largest 5.3 mm) in the RML. 6.3 cc calcified granuloma in the posterior LUL.  iscuss interim labs.  No evidence of B12  deficiency.  He has iron deficiency with urinalysis revealing trace to small hemoglobin.  Discuss referral to nephrology. 2.  Discuss low immunoglobulin levels.  SPEP, free light chains, and 24 hour UPEP are negative. 3.  Continue yearly low dose chest CT based on patient's smoking history last done at  [facility] in April/May 2018.  4.  Referral to nephrology (Dr Holley Raring) re: hemoglobinuria. 5.  Continue oral iron supplementation. 6.  RTC in 3 months for  labs (CBC with diff, ferritin). 7.  RTC in 6 months for MD assessment and labs (CBC with diff, ferritin).    Honor Loh, NP  03/04/2017, 5:55 PM

## 2017-03-05 ENCOUNTER — Inpatient Hospital Stay: Payer: PPO

## 2017-03-05 ENCOUNTER — Other Ambulatory Visit: Payer: Self-pay | Admitting: Hematology and Oncology

## 2017-03-05 ENCOUNTER — Inpatient Hospital Stay: Payer: PPO | Admitting: Hematology and Oncology

## 2017-03-05 DIAGNOSIS — D801 Nonfamilial hypogammaglobulinemia: Secondary | ICD-10-CM

## 2017-03-06 DIAGNOSIS — R200 Anesthesia of skin: Secondary | ICD-10-CM | POA: Insufficient documentation

## 2017-03-06 DIAGNOSIS — M79605 Pain in left leg: Secondary | ICD-10-CM | POA: Insufficient documentation

## 2017-03-06 DIAGNOSIS — R202 Paresthesia of skin: Secondary | ICD-10-CM | POA: Diagnosis not present

## 2017-03-12 ENCOUNTER — Other Ambulatory Visit: Payer: Self-pay | Admitting: Acute Care

## 2017-03-12 DIAGNOSIS — M545 Low back pain: Secondary | ICD-10-CM

## 2017-03-14 ENCOUNTER — Ambulatory Visit
Admission: RE | Admit: 2017-03-14 | Discharge: 2017-03-14 | Disposition: A | Discharge status: Home / Self Care | Payer: PPO | Source: Ambulatory Visit | Attending: Acute Care | Admitting: Acute Care

## 2017-03-14 DIAGNOSIS — M48061 Spinal stenosis, lumbar region without neurogenic claudication: Secondary | ICD-10-CM | POA: Insufficient documentation

## 2017-03-14 DIAGNOSIS — M5127 Other intervertebral disc displacement, lumbosacral region: Secondary | ICD-10-CM | POA: Insufficient documentation

## 2017-03-14 DIAGNOSIS — M545 Low back pain: Secondary | ICD-10-CM | POA: Diagnosis not present

## 2017-03-16 DIAGNOSIS — J449 Chronic obstructive pulmonary disease, unspecified: Secondary | ICD-10-CM | POA: Diagnosis not present

## 2017-03-16 DIAGNOSIS — R0603 Acute respiratory distress: Secondary | ICD-10-CM | POA: Diagnosis not present

## 2017-03-18 ENCOUNTER — Encounter: Payer: Self-pay | Admitting: *Deleted

## 2017-03-19 ENCOUNTER — Inpatient Hospital Stay: Payer: PPO | Attending: Hematology and Oncology | Admitting: Hematology and Oncology

## 2017-03-19 ENCOUNTER — Inpatient Hospital Stay: Payer: PPO

## 2017-03-19 VITALS — BP 144/84 | HR 67 | Temp 96.6°F | Resp 20 | Wt 155.4 lb

## 2017-03-19 DIAGNOSIS — D500 Iron deficiency anemia secondary to blood loss (chronic): Secondary | ICD-10-CM

## 2017-03-19 DIAGNOSIS — J189 Pneumonia, unspecified organism: Secondary | ICD-10-CM | POA: Insufficient documentation

## 2017-03-19 DIAGNOSIS — R823 Hemoglobinuria: Secondary | ICD-10-CM

## 2017-03-19 DIAGNOSIS — D801 Nonfamilial hypogammaglobulinemia: Secondary | ICD-10-CM | POA: Insufficient documentation

## 2017-03-19 DIAGNOSIS — I2510 Atherosclerotic heart disease of native coronary artery without angina pectoris: Secondary | ICD-10-CM | POA: Insufficient documentation

## 2017-03-19 DIAGNOSIS — I10 Essential (primary) hypertension: Secondary | ICD-10-CM | POA: Diagnosis not present

## 2017-03-19 DIAGNOSIS — Z7984 Long term (current) use of oral hypoglycemic drugs: Secondary | ICD-10-CM | POA: Diagnosis not present

## 2017-03-19 DIAGNOSIS — E119 Type 2 diabetes mellitus without complications: Secondary | ICD-10-CM | POA: Insufficient documentation

## 2017-03-19 DIAGNOSIS — Z79899 Other long term (current) drug therapy: Secondary | ICD-10-CM | POA: Diagnosis not present

## 2017-03-19 DIAGNOSIS — J440 Chronic obstructive pulmonary disease with acute lower respiratory infection: Secondary | ICD-10-CM | POA: Insufficient documentation

## 2017-03-19 DIAGNOSIS — D509 Iron deficiency anemia, unspecified: Secondary | ICD-10-CM | POA: Diagnosis not present

## 2017-03-19 DIAGNOSIS — R918 Other nonspecific abnormal finding of lung field: Secondary | ICD-10-CM

## 2017-03-19 DIAGNOSIS — Z87891 Personal history of nicotine dependence: Secondary | ICD-10-CM | POA: Diagnosis not present

## 2017-03-19 DIAGNOSIS — Z7982 Long term (current) use of aspirin: Secondary | ICD-10-CM | POA: Diagnosis not present

## 2017-03-19 LAB — URINALYSIS, COMPLETE (UACMP) WITH MICROSCOPIC
BACTERIA UA: NONE SEEN
Glucose, UA: NEGATIVE mg/dL
Leukocytes, UA: NEGATIVE
Nitrite: NEGATIVE
PROTEIN: 30 mg/dL — AB
SPECIFIC GRAVITY, URINE: 1.025 (ref 1.005–1.030)
Squamous Epithelial / LPF: NONE SEEN
pH: 6 (ref 5.0–8.0)

## 2017-03-19 LAB — CBC WITH DIFFERENTIAL/PLATELET
Basophils Absolute: 0.1 10*3/uL (ref 0–0.1)
Basophils Relative: 1 %
Eosinophils Absolute: 0.2 10*3/uL (ref 0–0.7)
Eosinophils Relative: 2 %
HCT: 44.9 % (ref 40.0–52.0)
Hemoglobin: 15.1 g/dL (ref 13.0–18.0)
Lymphocytes Relative: 16 %
Lymphs Abs: 1.4 10*3/uL (ref 1.0–3.6)
MCH: 29.5 pg (ref 26.0–34.0)
MCHC: 33.7 g/dL (ref 32.0–36.0)
MCV: 87.8 fL (ref 80.0–100.0)
Monocytes Absolute: 0.9 10*3/uL (ref 0.2–1.0)
Monocytes Relative: 10 %
Neutro Abs: 5.8 10*3/uL (ref 1.4–6.5)
Neutrophils Relative %: 71 %
Platelets: 280 10*3/uL (ref 150–440)
RBC: 5.11 MIL/uL (ref 4.40–5.90)
RDW: 14.6 % — ABNORMAL HIGH (ref 11.5–14.5)
WBC: 8.4 10*3/uL (ref 3.8–10.6)

## 2017-03-19 LAB — FERRITIN: Ferritin: 37 ng/mL (ref 24–336)

## 2017-03-19 NOTE — Progress Notes (Signed)
[facility] day:  03/19/2017  Chief Complaint: Bradley [name] is a 67 y.o. male with iron deficiency anemia who is seen for 6 month assessment.   HPI:  The patient was last seen in the hematology clinic on 08/21/2016.  At that time, patient was feeling "better" overall. He denied any acute physical complaints. He was taken oral iron daily as recommended.  CBC on 11/19/2016 revealed a hemoglobin 15.1, hematocrit 45.1, and MCV 86.5.  Ferritin was 30.  He was advised to continue his oral iron with vitamin C supplement daily.  Patient had a low dose chest CT lung screening on 09/03/2016.  CT scan demonstrated left main three-vessel coronary atherosclerosis. There were small mediastinal lymph nodes that did not meet pathologic CT size criteria. There were scattered bilateral pulmonary nodules measuring up to 5.3 mm noted in the RIGHT middle lobe. Additionally, there was a 6.3 mm calcified granuloma in the posterior LEFT upper lobe. Lung- RADS 2S, suggesting a benign appearance her behavior. Recommendations were for continued annual screening with low dose chest CT without contrast.  Symptomatically, patient is doing "better" overall.  He stopped smoking in April. Patient states, "I don't have to use my oxygen anymore". Patient notes that he has "hip troubles" (pain), but "another doctor is taking care of that". Pain rated 10/10 in the clinic today. Patient is trying to be more active. He "walks around [place]" on a daily basis.   Patient denies bleeding; no hematochezia, melena, or gross hematuria. Patient is eating meat 3-4 times a week. He is eating vegetables 3 times a week. Patient denies ice pica and restless legs. He is no longer taking his oral iron supplementation. Iron supplement was last taken "several months ago".   Patient did not follow up with urology as discussed. He states, "Dr. Kym Groom put me on some antibiotics and it corrected itself".    Past  Medical History:  Diagnosis Date  . COPD (chronic obstructive pulmonary disease) ([place])   . Coronary artery disease   . Diabetes mellitus without complication ([place])   . Hypertension     Past Surgical History:  Procedure Laterality Date  . LUNG BIOPSY Right    2006  . right side partial lobe removed       Family History  Problem Relation Age of Onset  . Cancer Mother   . Heart attack Father   . Cancer Sister     Social History:  reports that he quit smoking about 9 months ago. He has a 52.00 pack-year smoking history. he has never used smokeless tobacco. He reports that he does not drink alcohol or use drugs.  He smoked 1 pack/day since age 64 until 9 weeks ago.  He quit smoking on 06/18/2016 at 10 AM.  He denies any exposure to radiation or toxins.  He is retired from working for the [employer].  His wife died 58 months ago with "cancer in the blood" (? leukemia/lymphoma).  The patient is alone today.  Allergies: No Known Allergies  Current Medications: Current Outpatient Medications  Medication Sig Dispense Refill  . albuterol (PROVENTIL) (2.5 MG/3ML) 0.083% nebulizer solution Take 2.5 mg by nebulization every 6 (six) hours as needed for wheezing or shortness of breath.    Marland Kitchen aspirin EC 81 MG tablet Take 81 mg by mouth daily.    . clopidogrel (PLAVIX) 75 MG tablet Take 75 mg by mouth daily.    . enalapril (VASOTEC) 10 MG  tablet Take 10 mg by mouth daily.    . ferrous sulfate 325 (65 FE) MG EC tablet Take 1 tablet (325 mg total) by mouth daily. 30 tablet 3  . Fluticasone-Salmeterol (ADVAIR) 250-50 MCG/DOSE AEPB Inhale 1 puff into the lungs 2 (two) times daily. 60 each 0  . metFORMIN (GLUCOPHAGE) 500 MG tablet Take 1 tablet (500 mg total) by mouth 2 (two) times daily with a meal. Takes every morning and then takes another one in the evening if his "sugar starts acting up" (Patient taking differently: Take 500 mg by mouth daily with breakfast. ) 60 tablet 0  . metoprolol  (LOPRESSOR) 50 MG tablet Take 50 mg by mouth 2 (two) times daily.    . nicotine (NICODERM CQ - DOSED IN MG/24 HOURS) 21 mg/24hr patch Place 1 patch (21 mg total) onto the skin daily. 28 patch 0  . nitroGLYCERIN (NITROSTAT) 0.4 MG SL tablet Place 0.4 mg under the tongue every 5 (five) minutes as needed for chest pain.    Marland Kitchen nystatin (MYCOSTATIN) 100000 UNIT/ML suspension Take 5 mLs (500,000 Units total) by mouth 4 (four) times daily as needed. 60 mL 0  . predniSONE (DELTASONE) 5 MG tablet 4 tabs po day1; 3 tabs po day2; 2 tabs po day3; 1 tab po day4 10 tablet 0  . PROAIR HFA 108 (90 Base) MCG/ACT inhaler Inhale 2 puffs into the lungs every 6 (six) hours as needed for wheezing or shortness of breath.    . simvastatin (ZOCOR) 20 MG tablet Take 20 mg by mouth at bedtime.     Marland Kitchen tiotropium (SPIRIVA) 18 MCG inhalation capsule Place 1 capsule (18 mcg total) into inhaler and inhale daily. 30 capsule 0   No current facility-administered medications for this visit.     Review of Systems:  GENERAL:  Feels "fine except for hip".  No fevers or sweats.  Weight up 14 pounds. PERFORMANCE STATUS (ECOG):  0 HEENT:  Allergies.  No visual changes, runny nose, sore throat, mouth sores or tenderness. Lungs: No shortness of breath.  Dry cough.  No hemoptysis.  No smoking. Cardiac:  No chest pain, palpitations, orthopnea, or PND. GI:  No nausea, vomiting, diarrhea, constipation, melena or hematochezia.  No ice pica. GU:  No urgency, frequency, dysuria, or hematuria.  Interval UTI treatment. Musculoskeletal:  No back pain.  Hip pain (see HPI).  No muscle tenderness. Extremities:  No pain or swelling. Skin:  No rashes or skin changes. Neuro:  No headache, numbness or weakness, balance or coordination issues.  No restless legs. Endocrine:  No diabetes, thyroid issues, hot flashes or night sweats. Psych:  No mood changes, depression or anxiety. Pain:  10/10; bilateral hips Review of systems:  All other systems reviewed  and found to be negative.  Physical Exam: Blood pressure (!) 144/84, pulse 67, temperature (!) 96.6 F (35.9 C), temperature source Tympanic, resp. rate 20, weight 155 lb 6.8 oz (70.5 kg). GENERAL:  Thin gentleman sitting comfortably in the exam room in no acute distress. MENTAL STATUS:  Alert and oriented to person, place and time. HEAD:  Wearing a camo cap.  Gray hair.  Male pattern baldness.  Goatee.  Normocephalic, atraumatic, face symmetric, no Cushingoid features. EYES:  Blue eyes.  Pupils equal round and reactive to light and accomodation.  No conjunctivitis or scleral icterus. ENT:  Oropharynx clear without lesion.  Tongue normal. Mucous membranes moist.  RESPIRATORY:  Clear to auscultation without rales, wheezes or rhonchi. CARDIOVASCULAR:  Regular rate and  rhythm without murmur, rub or gallop. ABDOMEN:  Soft, non-tender, with active bowel sounds, and no hepatosplenomegaly.  No masses. SKIN:  Ecchymosis on arms.  Tan.  No rashes, ulcers or lesions. EXTREMITIES: No edema, no skin discoloration or tenderness.  No palpable cords. LYMPH NODES: No palpable cervical, supraclavicular, axillary or inguinal adenopathy  NEUROLOGICAL: Unremarkable. PSYCH:  Appropriate.   Appointment on 03/19/2017  Component Date Value Ref Range Status  . WBC 03/19/2017 8.4  3.8 - 10.6 K/uL Final  . RBC 03/19/2017 5.11  4.40 - 5.90 MIL/uL Final  . Hemoglobin 03/19/2017 15.1  13.0 - 18.0 g/dL Final  . HCT 03/19/2017 44.9  40.0 - 52.0 % Final  . MCV 03/19/2017 87.8  80.0 - 100.0 fL Final  . MCH 03/19/2017 29.5  26.0 - 34.0 pg Final  . MCHC 03/19/2017 33.7  32.0 - 36.0 g/dL Final  . RDW 03/19/2017 14.6* 11.5 - 14.5 % Final  . Platelets 03/19/2017 280  150 - 440 K/uL Final  . Neutrophils Relative % 03/19/2017 71  % Final  . Neutro Abs 03/19/2017 5.8  1.4 - 6.5 K/uL Final  . Lymphocytes Relative 03/19/2017 16  % Final  . Lymphs Abs 03/19/2017 1.4  1.0 - 3.6 K/uL Final  . Monocytes Relative 03/19/2017 10  %  Final  . Monocytes Absolute 03/19/2017 0.9  0.2 - 1.0 K/uL Final  . Eosinophils Relative 03/19/2017 2  % Final  . Eosinophils Absolute 03/19/2017 0.2  0 - 0.7 K/uL Final  . Basophils Relative 03/19/2017 1  % Final  . Basophils Absolute 03/19/2017 0.1  0 - 0.1 K/uL Final   Performed at [facility], [address], [place], [place] [address]    Assessment:  Bradley [name] is a 67 y.o. male with mild anemia with progressive microcytic indices over the past 2 years.  Diet is good.  He denies any melena, hematochezia or hematuria.  Colonoscopy on 07/01/2012 revealed diverticulosis.  He believes he had an EGD 10 years ago.  Work-up on 08/07/2016 revealed a hematocrit of 41.5, hemoglobin 13.7, MCV 81.9, platelets 337,000, white count 9300 with an [place] of 5800.  Normal studies included:  SPEP, folate, iron saturation (24%), TIBC (399), sed rate, retic (1.3%), TSH.  He has mild hypogammaglobulinemia with an IgG of 601 (700 - 1600).  IgG was 562 on 08/03/2014.  Ferritin was 16 (low).  B12 was 319 (low normal) with a normal MMA r/o B12 deficiency.  Free light chains were normal. 24 hour UPEP revealed 112 mg/24 hours (30-150).  Immunofixation was normal.  Urinalysis revealed trace hemoglobin.  One month prior urine hemoglobin was small.  Guaiac cards were negative x 3. Patient currently on Ferrous Sulfate supplementation; not taking with Vitamin C. Discussed dietary recommendations.   Ferritin has been followed: 16 on 08/07/2016, 30 on 11/19/2016, and 37 on 03/19/2017.  He has been hospitalized 2 times/year in the past 3 years for pneumonia. He has COPD.  He denies any other infections.  [facility] on 07/12/2016 revealed postsurgical changes status post partial lower lobe resection.  There was heterogeneous opacity at the resection site favoring to represent atelectasis with  possible superimposed inflammatory/infectious process. There were other small nodules within the right lower lobe  demonstrating minimal activity and stable.  Low dose chest CT lung screening on 09/03/2016 demonstrated small mediastinal lymph nodes that did not meet pathologic CT size criteria. There were scattered bilateral pulmonary nodules measuring up to 5.3 mm noted in  the RIGHT middle lobe. There was a 6.3 mm calcified granuloma in the posterior LEFT upper lobe. Recommendations were for continued annual screening with low dose chest CT without contrast.  Symptomatically, he denies any B symptoms. Patient has pain in his hips. Exam is unremarkable. CBC is normal.  Plan: 1. Labs today: CBC with differential, ferritin, IgG, UA 2. Discuss low dose chest CT done in July 2019. Scattered pulmonary nodules (largest 5.3 mm) in the RML. Calcified granuloma in the posterior LUL. 3. Discuss follow up with nephrology (Dr Holley Raring) re: hemoglobinuria. Will check UA today.  4. Discuss iron stores. Patient has stopped his oral iron supplementation. Will call with ferritin today and advise patient whether to restart or not. 5. RTC in 3 months for  labs (CBC with diff, ferritin). 6. RTC in 6 months (after CT) for MD assessment and labs (CBC with diff, ferritin).    Honor Loh, NP  03/19/2017, 10:20 AM   I saw and evaluated the patient, participating in the key portions of the service and reviewing pertinent diagnostic studies and records.  I reviewed the nurse practitioner's note and agree with the findings and the plan.  The assessment and plan were discussed with the patient.  A few questions were asked by the patient and answered.   Nolon Stalls, MD 03/19/2017,10:20 AM

## 2017-03-19 NOTE — Progress Notes (Signed)
Patient complains of hip pain, otherwise no complaints.

## 2017-03-20 LAB — IGG: IgG (Immunoglobin G), Serum: 711 mg/dL (ref 700–1600)

## 2017-04-05 ENCOUNTER — Encounter: Payer: Self-pay | Admitting: Hematology and Oncology

## 2017-04-09 DIAGNOSIS — M5136 Other intervertebral disc degeneration, lumbar region: Secondary | ICD-10-CM | POA: Diagnosis not present

## 2017-04-09 DIAGNOSIS — M5416 Radiculopathy, lumbar region: Secondary | ICD-10-CM | POA: Diagnosis not present

## 2017-04-09 DIAGNOSIS — M48062 Spinal stenosis, lumbar region with neurogenic claudication: Secondary | ICD-10-CM | POA: Diagnosis not present

## 2017-04-16 DIAGNOSIS — J449 Chronic obstructive pulmonary disease, unspecified: Secondary | ICD-10-CM | POA: Diagnosis not present

## 2017-04-16 DIAGNOSIS — R0603 Acute respiratory distress: Secondary | ICD-10-CM | POA: Diagnosis not present

## 2017-05-01 DIAGNOSIS — M5126 Other intervertebral disc displacement, lumbar region: Secondary | ICD-10-CM | POA: Diagnosis not present

## 2017-05-01 DIAGNOSIS — M5416 Radiculopathy, lumbar region: Secondary | ICD-10-CM | POA: Diagnosis not present

## 2017-05-14 DIAGNOSIS — R0603 Acute respiratory distress: Secondary | ICD-10-CM | POA: Diagnosis not present

## 2017-05-14 DIAGNOSIS — J449 Chronic obstructive pulmonary disease, unspecified: Secondary | ICD-10-CM | POA: Diagnosis not present

## 2017-05-29 DIAGNOSIS — M5416 Radiculopathy, lumbar region: Secondary | ICD-10-CM | POA: Diagnosis not present

## 2017-05-29 DIAGNOSIS — M5126 Other intervertebral disc displacement, lumbar region: Secondary | ICD-10-CM | POA: Diagnosis not present

## 2017-06-14 DIAGNOSIS — J449 Chronic obstructive pulmonary disease, unspecified: Secondary | ICD-10-CM | POA: Diagnosis not present

## 2017-06-14 DIAGNOSIS — R0603 Acute respiratory distress: Secondary | ICD-10-CM | POA: Diagnosis not present

## 2017-06-18 ENCOUNTER — Inpatient Hospital Stay: Payer: PPO | Attending: Hematology and Oncology

## 2017-06-18 ENCOUNTER — Telehealth: Payer: Self-pay | Admitting: *Deleted

## 2017-06-18 DIAGNOSIS — J189 Pneumonia, unspecified organism: Secondary | ICD-10-CM | POA: Diagnosis not present

## 2017-06-18 DIAGNOSIS — J440 Chronic obstructive pulmonary disease with acute lower respiratory infection: Secondary | ICD-10-CM | POA: Diagnosis not present

## 2017-06-18 DIAGNOSIS — Z7982 Long term (current) use of aspirin: Secondary | ICD-10-CM | POA: Diagnosis not present

## 2017-06-18 DIAGNOSIS — D509 Iron deficiency anemia, unspecified: Secondary | ICD-10-CM | POA: Diagnosis not present

## 2017-06-18 DIAGNOSIS — I2510 Atherosclerotic heart disease of native coronary artery without angina pectoris: Secondary | ICD-10-CM | POA: Diagnosis not present

## 2017-06-18 DIAGNOSIS — Z7984 Long term (current) use of oral hypoglycemic drugs: Secondary | ICD-10-CM | POA: Insufficient documentation

## 2017-06-18 DIAGNOSIS — Z79899 Other long term (current) drug therapy: Secondary | ICD-10-CM | POA: Insufficient documentation

## 2017-06-18 DIAGNOSIS — I10 Essential (primary) hypertension: Secondary | ICD-10-CM | POA: Insufficient documentation

## 2017-06-18 DIAGNOSIS — E119 Type 2 diabetes mellitus without complications: Secondary | ICD-10-CM | POA: Insufficient documentation

## 2017-06-18 DIAGNOSIS — D801 Nonfamilial hypogammaglobulinemia: Secondary | ICD-10-CM | POA: Diagnosis not present

## 2017-06-18 DIAGNOSIS — Z87891 Personal history of nicotine dependence: Secondary | ICD-10-CM | POA: Insufficient documentation

## 2017-06-18 LAB — CBC WITH DIFFERENTIAL/PLATELET
Basophils Absolute: 0.1 10*3/uL (ref 0–0.1)
Basophils Relative: 1 %
Eosinophils Absolute: 0.1 10*3/uL (ref 0–0.7)
Eosinophils Relative: 1 %
HCT: 42.8 % (ref 40.0–52.0)
Hemoglobin: 14.4 g/dL (ref 13.0–18.0)
Lymphocytes Relative: 16 %
Lymphs Abs: 1.4 10*3/uL (ref 1.0–3.6)
MCH: 30.1 pg (ref 26.0–34.0)
MCHC: 33.7 g/dL (ref 32.0–36.0)
MCV: 89.5 fL (ref 80.0–100.0)
Monocytes Absolute: 0.9 10*3/uL (ref 0.2–1.0)
Monocytes Relative: 10 %
Neutro Abs: 6.6 10*3/uL — ABNORMAL HIGH (ref 1.4–6.5)
Neutrophils Relative %: 72 %
Platelets: 305 10*3/uL (ref 150–440)
RBC: 4.78 MIL/uL (ref 4.40–5.90)
RDW: 15.1 % — ABNORMAL HIGH (ref 11.5–14.5)
WBC: 9.2 10*3/uL (ref 3.8–10.6)

## 2017-06-18 LAB — FERRITIN: Ferritin: 23 ng/mL — ABNORMAL LOW (ref 24–336)

## 2017-06-18 NOTE — Telephone Encounter (Signed)
-----   Message from Lequita Asal, MD sent at 06/18/2017  1:53 PM EDT ----- Regarding: Please call patient  Ferritin down.  Restart oral iron 1 pill/day with OJ or vitamin C.  M  ----- Message ----- From: Interface, [facility] [place] Sent: 06/18/2017   9:20 AM To: Lequita Asal, MD

## 2017-06-18 NOTE — Telephone Encounter (Signed)
Called patient to inform him that his ferritin is a little lower.  MD recommends he restart his oral iron, 1 pill a day to be taken either with OJ or Vitamin C.  Patient verbalized understanding.

## 2017-06-27 DIAGNOSIS — Z87891 Personal history of nicotine dependence: Secondary | ICD-10-CM | POA: Diagnosis not present

## 2017-06-27 DIAGNOSIS — I2510 Atherosclerotic heart disease of native coronary artery without angina pectoris: Secondary | ICD-10-CM | POA: Diagnosis not present

## 2017-06-27 DIAGNOSIS — R911 Solitary pulmonary nodule: Secondary | ICD-10-CM | POA: Diagnosis not present

## 2017-06-27 DIAGNOSIS — Z122 Encounter for screening for malignant neoplasm of respiratory organs: Secondary | ICD-10-CM | POA: Diagnosis not present

## 2017-07-12 ENCOUNTER — Ambulatory Visit
Admission: EM | Admit: 2017-07-12 | Discharge: 2017-07-12 | Disposition: A | Discharge status: Home / Self Care | Payer: PPO | Attending: Emergency Medicine | Admitting: Emergency Medicine

## 2017-07-12 ENCOUNTER — Other Ambulatory Visit: Payer: Self-pay

## 2017-07-12 DIAGNOSIS — H538 Other visual disturbances: Secondary | ICD-10-CM | POA: Diagnosis not present

## 2017-07-12 DIAGNOSIS — R197 Diarrhea, unspecified: Secondary | ICD-10-CM

## 2017-07-12 MED ORDER — ONDANSETRON 4 MG PO TBDP: 4.0000 mg | ORAL_TABLET | Freq: Three times a day (TID) | ORAL | 0 refills | Status: DC | PRN
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
## 2017-07-12 NOTE — Discharge Instructions (Signed)
Try the Zofran.  It will help slow the diarrhea down the may actually help with your appetite.  Push fluids.  Brat diet.  Follow-up with your primary care physician as scheduled.  Go immediately to the ER if your abdominal pain changes, gets worse, if you have fevers above 100.4, if you stop urinating, or for other concerns.  Also follow-up with Dr. Edison Pace, ophthalmology as soon as you possibly can have your vision further evaluated.

## 2017-07-12 NOTE — ED Provider Notes (Addendum)
HPI  SUBJECTIVE:  Bradley [name] is a 67 y.o. male who presents with several complaints.  First he reports 2 days of watery, nonbloody diarrhea, decreased appetite, generalized weakness.  He reports about 3 episodes of diarrhea a day.  He reports mild diffuse dull, intermittent abdominal pain that lasts approximately 10 minutes and resolves.  States that this abdominal pain is better with Pepto-Bismol.  No aggravating factors.  He states that his stool is starting to firm up.  He denies nausea, vomiting, fevers, abdominal distention, change in urine output.  No raw or undercooked foods, questionable leftovers, exposure to reptiles, chickens.  No contacts with a diarrheal illness.  No change in his medications recently.  He has not taken any opiates and 3 or 4 weeks.  He wonders if he is possibly being poisoned by his sister-in-law, who is his roommate.  Second, he reports 1 week of constant blurry vision that gets worse as the day progresses.  He states that his vision is normal in the morning.  States that the blurry vision is distance vision.  He states that his close vision has not changed.  He reports itchy eyes, and some mild redness in his left eye.  No eye pain, discharge.  The blurry vision does not clear with blinking.  No headaches, double vision, visual loss.  He wears reading glasses.  He does not wear glasses for distance.  No contacts.  No photophobia.  He tried allergy pills.  Symptoms are better with going to sleep, worse with blinking and as the day goes on.  Past medical history of COPD, quit smoking a year ago, Diabetes.  States that he has not had a diabetic eye exam in years, but that his glucose has been running within normal limits recently.  Also hypertension, hypercholesterolemia, coronary artery disease, status post carotid artery stent.  No abdominal surgeries, A. fib, mesenteric ischemia.  NWG:NFAOZH, Bradley Begin, Bradley [name] .  States that he has an appointment with him on Tuesday.  Ophthalmology: None.   Past Medical History:  Diagnosis Date  . COPD (chronic obstructive pulmonary disease) ([place])   . Coronary artery disease   . Diabetes mellitus without complication ([place])   . Hypertension     Past Surgical History:  Procedure Laterality Date  . LUNG BIOPSY Right    2006  . right side partial lobe removed       Family History  Problem Relation Age of Onset  . Cancer Mother   . Heart attack Father   . Cancer Sister     Social History   Tobacco Use  . Smoking status: Former Smoker    Packs/day: 1.00    Years: 52.00    Pack years: 52.00    Last attempt to quit: 06/18/2016    Years since quitting: 1.0  . Smokeless tobacco: Never Used  Substance Use Topics  . Alcohol use: No  . Drug use: No    No current facility-administered medications for this encounter.   Current Outpatient Medications:  .  albuterol (PROVENTIL) (2.5 MG/3ML) 0.083% nebulizer solution, Take 2.5 mg by nebulization every 6 (six) hours as needed for wheezing or shortness of breath., Disp: , Rfl:  .  aspirin EC 81 MG tablet, Take 81 mg by mouth daily., Disp: , Rfl:  .  clopidogrel (PLAVIX) 75 MG tablet, Take 75 mg by mouth daily., Disp: , Rfl:  .  enalapril (VASOTEC) 10 MG tablet, Take 10 mg by mouth daily., Disp: , Rfl:  .  ferrous sulfate 325 (65 FE) MG EC tablet, Take 1 tablet (325 mg total) by mouth daily., Disp: 30 tablet, Rfl: 3 .  Fluticasone-Salmeterol (ADVAIR) 250-50 MCG/DOSE AEPB, Inhale 1 puff into the lungs 2 (two) times daily., Disp: 60 each, Rfl: 0 .  metFORMIN (GLUCOPHAGE) 500 MG tablet, Take 1 tablet (500 mg total) by mouth 2 (two) times daily with a meal. Takes every morning and then takes another one in the evening if his "sugar starts acting up" (Patient taking differently: Take 500 mg by mouth daily with breakfast. ), Disp: 60 tablet, Rfl: 0 .  metoprolol (LOPRESSOR) 50 MG tablet, Take 50 mg by mouth 2 (two) times daily., Disp: , Rfl:  .  nitroGLYCERIN (NITROSTAT)  0.4 MG SL tablet, Place 0.4 mg under the tongue every 5 (five) minutes as needed for chest pain., Disp: , Rfl:  .  PROAIR HFA 108 (90 Base) MCG/ACT inhaler, Inhale 2 puffs into the lungs every 6 (six) hours as needed for wheezing or shortness of breath., Disp: , Rfl:  .  simvastatin (ZOCOR) 20 MG tablet, Take 20 mg by mouth at bedtime. , Disp: , Rfl:  .  tiotropium (SPIRIVA) 18 MCG inhalation capsule, Place 1 capsule (18 mcg total) into inhaler and inhale daily., Disp: 30 capsule, Rfl: 0 .  ondansetron (ZOFRAN ODT) 4 MG disintegrating tablet, Take 1 tablet (4 mg total) by mouth every 8 (eight) hours as needed for nausea or vomiting., Disp: 20 tablet, Rfl: 0  No Known Allergies   ROS  As noted in HPI.   Physical Exam  BP (!) 145/83 (BP Location: Right Arm)   Pulse 77   Temp 97.9 F (36.6 C) (Oral)   Resp 18   Ht 5\' 7"  (1.702 m)   Wt 150 lb (68 kg)   SpO2 95%   BMI 23.49 kg/m   Constitutional: Well developed, well nourished, no acute distress Eyes: PERRLA, EOMI, mild conjunctival injection left eye, no pain with EOMs.  No eye discharge.  No periorbital erythema, edema.  No direct or consensual photophobia.   Visual Acuity  Right Eye Distance: 20/50(uncorrected) Left Eye Distance: 20/40(uncorrected) Bilateral Distance: 20/40(uncorrected)  Right Eye Near:   Left Eye Near:    Bilateral Near:     HENT: Normocephalic, atraumatic,mucus membranes moist Respiratory: Normal inspiratory effort and air movement, lungs clear bilaterally Cardiovascular: Normal rate, regular rhythm, no murmurs, rubs, gallops.  Refill less than 2 seconds GI: Normal appearance, soft, mild periumbilical tenderness with deep palpation no guarding, rebound.  Active bowel sounds.  Nondistended. Back: No CVAT skin: No rash, skin intact Musculoskeletal: no deformities Neurologic: Alert & oriented x 3, no focal neuro deficits. cranial nerves II through XII intact. Psychiatric: Speech and behavior  appropriate   ED Course   Medications - No data to display  No orders of the defined types were placed in this encounter.   No results found for this or any previous visit (from the past 24 hour(s)). No results found.  ED Clinical Impression  Diarrhea, unspecified type  Blurry vision, bilateral   ED Assessment/Plan  1. Diarrhea- Patient does not appear to be severely dehydrated.  No fever >101.3, severe abdominal pain, recent admission, patient does not reside in nursing home.  No recent antibiotic use, bloody stools, >6 stools in 24-hour period, worsening of symptoms after 48 hours, evidence of severe dehydration such as decreased urine output.  Patient is not immunocompromised, no recent travel.  Patient tolerating by mouth.  Deferring labs today.  Plan to send home with Zofran for its constipating properties.  He will follow-up with his primary care physician in several days, to the ER if he gets worse.  2.  Blurry vision.  Visual acuity noted.  I suspect that the patient needs glasses.  No evidence of glaucoma, stroke, ophthalmologic emergency.  Referring to ophthalmology on-call, Dr. Edison Pace at the [facility] for further evaluation and a diabetic eye exam.  Discussed MDM, treatment plan, and plan for follow-up with patient. Discussed sn/sx that should prompt return to the ED. patient agrees with plan.   Meds ordered this encounter  Medications  . ondansetron (ZOFRAN ODT) 4 MG disintegrating tablet    Sig: Take 1 tablet (4 mg total) by mouth every 8 (eight) hours as needed for nausea or vomiting.    Dispense:  20 tablet    Refill:  0    *This clinic note was created using Lobby[employer]. Therefore, there may be occasional mistakes despite careful proofreading.   ?   Melynda Ripple, Bradley [name] 07/12/17 Mirian Capuchin    Melynda Ripple, Bradley [name] 07/12/17 [phone]

## 2017-07-14 DIAGNOSIS — J449 Chronic obstructive pulmonary disease, unspecified: Secondary | ICD-10-CM | POA: Diagnosis not present

## 2017-07-14 DIAGNOSIS — R0603 Acute respiratory distress: Secondary | ICD-10-CM | POA: Diagnosis not present

## 2017-07-15 DIAGNOSIS — N528 Other male erectile dysfunction: Secondary | ICD-10-CM | POA: Diagnosis not present

## 2017-07-15 DIAGNOSIS — R197 Diarrhea, unspecified: Secondary | ICD-10-CM | POA: Diagnosis not present

## 2017-07-15 DIAGNOSIS — Z0000 Encounter for general adult medical examination without abnormal findings: Secondary | ICD-10-CM | POA: Diagnosis not present

## 2017-07-15 DIAGNOSIS — Z125 Encounter for screening for malignant neoplasm of prostate: Secondary | ICD-10-CM | POA: Diagnosis not present

## 2017-07-15 DIAGNOSIS — J438 Other emphysema: Secondary | ICD-10-CM | POA: Diagnosis not present

## 2017-07-15 DIAGNOSIS — E119 Type 2 diabetes mellitus without complications: Secondary | ICD-10-CM | POA: Diagnosis not present

## 2017-07-15 DIAGNOSIS — I10 Essential (primary) hypertension: Secondary | ICD-10-CM | POA: Diagnosis not present

## 2017-07-15 DIAGNOSIS — R9389 Abnormal findings on diagnostic imaging of other specified body structures: Secondary | ICD-10-CM | POA: Diagnosis not present

## 2017-07-15 DIAGNOSIS — E782 Mixed hyperlipidemia: Secondary | ICD-10-CM | POA: Diagnosis not present

## 2017-07-16 DIAGNOSIS — R197 Diarrhea, unspecified: Secondary | ICD-10-CM | POA: Diagnosis not present

## 2017-07-25 DIAGNOSIS — M5126 Other intervertebral disc displacement, lumbar region: Secondary | ICD-10-CM | POA: Diagnosis not present

## 2017-07-25 DIAGNOSIS — M5416 Radiculopathy, lumbar region: Secondary | ICD-10-CM | POA: Diagnosis not present

## 2017-08-14 DIAGNOSIS — J449 Chronic obstructive pulmonary disease, unspecified: Secondary | ICD-10-CM | POA: Diagnosis not present

## 2017-08-14 DIAGNOSIS — R0603 Acute respiratory distress: Secondary | ICD-10-CM | POA: Diagnosis not present

## 2017-08-22 ENCOUNTER — Emergency Department
Admission: EM | Admit: 2017-08-22 | Discharge: 2017-08-22 | Disposition: A | Discharge status: Home / Self Care | Payer: PPO | Attending: Emergency Medicine | Admitting: Emergency Medicine

## 2017-08-22 ENCOUNTER — Encounter: Payer: Self-pay | Admitting: Emergency Medicine

## 2017-08-22 ENCOUNTER — Other Ambulatory Visit: Payer: Self-pay

## 2017-08-22 ENCOUNTER — Ambulatory Visit (INDEPENDENT_AMBULATORY_CARE_PROVIDER_SITE_OTHER)
Admission: EM | Admit: 2017-08-22 | Discharge: 2017-08-22 | Disposition: A | Discharge status: Other Acute Inpatient Hospital | Payer: PPO | Source: Home / Self Care | Attending: Emergency Medicine | Admitting: Emergency Medicine

## 2017-08-22 ENCOUNTER — Emergency Department: Payer: PPO

## 2017-08-22 DIAGNOSIS — Z7982 Long term (current) use of aspirin: Secondary | ICD-10-CM | POA: Insufficient documentation

## 2017-08-22 DIAGNOSIS — R109 Unspecified abdominal pain: Secondary | ICD-10-CM

## 2017-08-22 DIAGNOSIS — R0602 Shortness of breath: Secondary | ICD-10-CM | POA: Diagnosis not present

## 2017-08-22 DIAGNOSIS — E119 Type 2 diabetes mellitus without complications: Secondary | ICD-10-CM | POA: Insufficient documentation

## 2017-08-22 DIAGNOSIS — R0981 Nasal congestion: Secondary | ICD-10-CM

## 2017-08-22 DIAGNOSIS — Z7984 Long term (current) use of oral hypoglycemic drugs: Secondary | ICD-10-CM | POA: Insufficient documentation

## 2017-08-22 DIAGNOSIS — I10 Essential (primary) hypertension: Secondary | ICD-10-CM | POA: Insufficient documentation

## 2017-08-22 DIAGNOSIS — Z87891 Personal history of nicotine dependence: Secondary | ICD-10-CM | POA: Insufficient documentation

## 2017-08-22 DIAGNOSIS — J441 Chronic obstructive pulmonary disease with (acute) exacerbation: Secondary | ICD-10-CM

## 2017-08-22 DIAGNOSIS — Z7902 Long term (current) use of antithrombotics/antiplatelets: Secondary | ICD-10-CM

## 2017-08-22 DIAGNOSIS — R1084 Generalized abdominal pain: Secondary | ICD-10-CM | POA: Diagnosis not present

## 2017-08-22 DIAGNOSIS — R140 Abdominal distension (gaseous): Secondary | ICD-10-CM | POA: Diagnosis not present

## 2017-08-22 DIAGNOSIS — I2510 Atherosclerotic heart disease of native coronary artery without angina pectoris: Secondary | ICD-10-CM

## 2017-08-22 DIAGNOSIS — J449 Chronic obstructive pulmonary disease, unspecified: Secondary | ICD-10-CM | POA: Diagnosis not present

## 2017-08-22 DIAGNOSIS — Z79899 Other long term (current) drug therapy: Secondary | ICD-10-CM | POA: Insufficient documentation

## 2017-08-22 LAB — COMPREHENSIVE METABOLIC PANEL
ALK PHOS: 74 U/L (ref 38–126)
ALT: 18 U/L (ref 17–63)
ANION GAP: 11 (ref 5–15)
AST: 25 U/L (ref 15–41)
Albumin: 4 g/dL (ref 3.5–5.0)
BUN: 10 mg/dL (ref 6–20)
CALCIUM: 8.8 mg/dL — AB (ref 8.9–10.3)
CO2: 21 mmol/L — AB (ref 22–32)
Chloride: 99 mmol/L — ABNORMAL LOW (ref 101–111)
Creatinine, Ser: 0.62 mg/dL (ref 0.61–1.24)
GFR calc Af Amer: >60 mL/min (ref >60)
GFR calc non Af Amer: >60 mL/min (ref >60)
Glucose, Bld: 160 mg/dL — ABNORMAL HIGH (ref 65–99)
Potassium: 3.8 mmol/L (ref 3.5–5.1)
SODIUM: 131 mmol/L — AB (ref 135–145)
TOTAL PROTEIN: 7.1 g/dL (ref 6.5–8.1)
Total Bilirubin: 0.5 mg/dL (ref 0.3–1.2)

## 2017-08-22 LAB — CBC
HCT: 44.5 % (ref 40.0–52.0)
HEMOGLOBIN: 15 g/dL (ref 13.0–18.0)
MCH: 30.1 pg (ref 26.0–34.0)
MCHC: 33.8 g/dL (ref 32.0–36.0)
MCV: 89.1 fL (ref 80.0–100.0)
Platelets: 340 10*3/uL (ref 150–440)
RBC: 4.99 MIL/uL (ref 4.40–5.90)
RDW: 13.9 % (ref 11.5–14.5)
WBC: 11.1 10*3/uL — ABNORMAL HIGH (ref 3.8–10.6)

## 2017-08-22 LAB — URINALYSIS, COMPLETE (UACMP) WITH MICROSCOPIC
Bacteria, UA: NONE SEEN
Bilirubin Urine: NEGATIVE
GLUCOSE, UA: NEGATIVE mg/dL
KETONES UR: 20 mg/dL — AB
Leukocytes, UA: NEGATIVE
NITRITE: NEGATIVE
PROTEIN: NEGATIVE mg/dL
Specific Gravity, Urine: 1.013 (ref 1.005–1.030)
Squamous Epithelial / LPF: NONE SEEN (ref 0–5)
pH: 5 (ref 5.0–8.0)

## 2017-08-22 LAB — LIPASE, BLOOD: Lipase: 39 U/L (ref 11–51)

## 2017-08-22 MED ORDER — PREDNISONE 20 MG PO TABS: 60.0000 mg | ORAL_TABLET | Freq: Once | ORAL | Status: DC

## 2017-08-22 MED ORDER — LEVOFLOXACIN 750 MG PO TABS: 750.0000 mg | ORAL_TABLET | Freq: Every day | ORAL | 0 refills | Status: DC

## 2017-08-22 MED ORDER — POLYETHYLENE GLYCOL 3350 17 G PO PACK
17.0000 g | PACK | Freq: Every day | ORAL | 0 refills | Status: DC
Start: 2017-08-22 — End: 2017-09-05

## 2017-08-22 MED ORDER — PREDNISONE 50 MG PO TABS
60.0000 mg | ORAL_TABLET | Freq: Once | ORAL | Status: AC
  Administered 2017-08-22: 60 mg via ORAL

## 2017-08-22 MED ORDER — PREDNISONE 10 MG (21) PO TBPK: ORAL_TABLET | Freq: Every day | ORAL | 0 refills | Status: DC

## 2017-08-22 MED ORDER — HYDROCOD POLST-CPM POLST ER 10-8 MG/5ML PO SUER
5.0000 mL | Freq: Once | ORAL | Status: AC
  Administered 2017-08-22: 5 mL via ORAL
  Filled 2017-08-22: qty 5

## 2017-08-22 MED ORDER — IPRATROPIUM-ALBUTEROL 0.5-2.5 (3) MG/3ML IN SOLN
3.0000 mL | Freq: Once | RESPIRATORY_TRACT | Status: AC
  Administered 2017-08-22: 3 mL via RESPIRATORY_TRACT

## 2017-08-22 MED ORDER — ALBUTEROL SULFATE (2.5 MG/3ML) 0.083% IN NEBU
5.0000 mg | INHALATION_SOLUTION | Freq: Once | RESPIRATORY_TRACT | Status: AC
  Administered 2017-08-22: 5 mg via RESPIRATORY_TRACT
  Filled 2017-08-22: qty 6

## 2017-08-22 NOTE — Discharge Instructions (Addendum)
Go immediately to the [facility] emergency department.  We need to get more information about why you are so short of breath.  Let them know if you start having chest pressure, heaviness, shortness of breath gets worse, or for other concerns.

## 2017-08-22 NOTE — ED Notes (Signed)
Patient in CT at this time.

## 2017-08-22 NOTE — ED Provider Notes (Signed)
HPI  SUBJECTIVE:  Bradley [name] is a 67 y.o. male who presents with shortness of breath, chest congestion for the past week.  He reports a cough productive of clear sputum.  He denies change in the color or volume of his sputum.  He reports wheezing, dyspnea on exertion to 50-100 feet.  He reports diffuse daily chest tightness during this time.  No fevers.  No other chest pain.  No lower extremity edema, unintentional weight gain, PND, orthopnea, nocturia, abdominal pain.  No surgery in the past 4 weeks, hemoptysis, prolonged immobilization, calf pain, swelling.  He also reports 2 to 3 months of abdominal swelling.  States he feels as if something is pressing on his lungs.  He denies change in diet.  He has been taking Robitussin-DM, albuterol for his shortness of breath with some temporary improvement in his symptoms, symptoms are worse with exertion, talking.  He states this feels similar to previous COPD exacerbations.  He does not know his baseline peak flow.  He has a past medical history of COPD and is compliant with his medications.  He is also status post partial right lobe lobectomy.  He has a carotid artery stent and is on Plavix.  History of coronary artery disease, diabetes.  He is an occasional drinker.  Denies daily alcohol use.  No history of CHF, MI, PE, DVT, cancer, cirrhosis or other liver disease.  PMD: Valera Castle, MD.  Pulmonology: [facility]    Past Medical History:  Diagnosis Date  . COPD (chronic obstructive pulmonary disease) ([place])   . Coronary artery disease   . Diabetes mellitus without complication ([place])   . Hypertension     Past Surgical History:  Procedure Laterality Date  . LUNG BIOPSY Right    2006  . right side partial lobe removed       Family History  Problem Relation Age of Onset  . Cancer Mother   . Heart attack Father   . Cancer Sister     Social History   Tobacco Use  . Smoking status: Former Smoker    Packs/day: 1.00   Years: 52.00    Pack years: 52.00    Last attempt to quit: 06/18/2016    Years since quitting: 1.1  . Smokeless tobacco: Never Used  Substance Use Topics  . Alcohol use: No  . Drug use: No    No current facility-administered medications for this encounter.   Current Outpatient Medications:  .  albuterol (PROVENTIL) (2.5 MG/3ML) 0.083% nebulizer solution, Take 2.5 mg by nebulization every 6 (six) hours as needed for wheezing or shortness of breath., Disp: , Rfl:  .  aspirin EC 81 MG tablet, Take 81 mg by mouth daily., Disp: , Rfl:  .  enalapril (VASOTEC) 10 MG tablet, Take 10 mg by mouth daily., Disp: , Rfl:  .  ferrous sulfate 325 (65 FE) MG EC tablet, Take 1 tablet (325 mg total) by mouth daily., Disp: 30 tablet, Rfl: 3 .  Fluticasone-Salmeterol (ADVAIR) 250-50 MCG/DOSE AEPB, Inhale 1 puff into the lungs 2 (two) times daily., Disp: 60 each, Rfl: 0 .  metFORMIN (GLUCOPHAGE) 500 MG tablet, Take 1 tablet (500 mg total) by mouth 2 (two) times daily with a meal. Takes every morning and then takes another one in the evening if his "sugar starts acting up" (Patient taking differently: Take 500 mg by mouth daily with breakfast. ), Disp: 60 tablet, Rfl: 0 .  metoprolol (LOPRESSOR) 50 MG tablet, Take 50  mg by mouth 2 (two) times daily., Disp: , Rfl:  .  nitroGLYCERIN (NITROSTAT) 0.4 MG SL tablet, Place 0.4 mg under the tongue every 5 (five) minutes as needed for chest pain., Disp: , Rfl:  .  ondansetron (ZOFRAN ODT) 4 MG disintegrating tablet, Take 1 tablet (4 mg total) by mouth every 8 (eight) hours as needed for nausea or vomiting., Disp: 20 tablet, Rfl: 0 .  PROAIR HFA 108 (90 Base) MCG/ACT inhaler, Inhale 2 puffs into the lungs every 6 (six) hours as needed for wheezing or shortness of breath., Disp: , Rfl:  .  simvastatin (ZOCOR) 20 MG tablet, Take 20 mg by mouth at bedtime. , Disp: , Rfl:  .  tiotropium (SPIRIVA) 18 MCG inhalation capsule, Place 1 capsule (18 mcg total) into inhaler and inhale  daily., Disp: 30 capsule, Rfl: 0 .  clopidogrel (PLAVIX) 75 MG tablet, Take 75 mg by mouth daily., Disp: , Rfl:   No Known Allergies   ROS  As noted in HPI.   Physical Exam  BP (!) 149/74 (BP Location: Left Arm)   Pulse 73   Temp 97.9 F (36.6 C)   Resp 20   Ht 5\' 7"  (1.702 m)   Wt 150 lb (68 kg)   SpO2 94%   BMI 23.49 kg/m   Constitutional: Well developed, well nourished, no acute distress. dyspnic when talking Eyes:  EOMI, conjunctiva normal bilaterally HENT: Normocephalic, atraumatic,mucus membranes moist Respiratory: Normal inspiratory effort. Diffuse inspiratory/exp wheezing. No rales or ronchi.  Cardiovascular: Normal rate, regular rhythm, no , murmur, rubs, gallops,  no JVD. GI: soft. + RUQ tenderness, No appreciable hepatomegaly.  Positive abdominal distention.  Question ascites, fluid wave. skin: No rash, skin intact Musculoskeletal: no deformities calves symmetric, no edema Neurologic: Alert & oriented x 3, no focal neuro deficits Psychiatric: Speech and behavior appropriate   ED Course   Medications  ipratropium-albuterol (DUONEB) 0.5-2.5 (3) MG/3ML nebulizer solution 3 mL (3 mLs Nebulization Given 08/22/17 1243)  predniSONE (DELTASONE) tablet 60 mg (60 mg Oral Given 08/22/17 1242)    Orders Placed This Encounter  Procedures  . Peak flow    Standing Status:   Standing    Number of Occurrences:   1    Order Specific Question:   Pre and post Neb    Answer:   Yes  . EKG 12-Lead    Standing Status:   Standing    Number of Occurrences:   1    No results found for this or any previous visit (from the past 24 hour(s)). No results found.  ED Clinical Impression  Shortness of breath  COPD exacerbation (HCC)   ED Assessment/Plan  Baseline oxygen saturation is 95%.  EKG: Normal sinus rhythm, rate 76.  Normal axis.  Right bundle branch block previous EKG from 07/15/2016.  No hypertrophy.  No ST-T wave changes consistent with ischemia.  Patient was  given 60 mg of prednisone and a DuoNeb for presumed COPD exacerbation.  Calculated peak flow: 490. Pretreatment peak flow: 180/200/150 Post treatments #1: 150/150/150 Walking sat post treatment 88.  On reevaluation, patient states that he does not feel a whole lot better.  He desaturates and becomes very short of breath with walking.  Suspect that he has a COPD exacerbation, but concern for the right upper quadrant tenderness.  He gets very uncomfortable with lying down flat.  Wonder if he does not have some ascites contributing to his shortness of breath.  Doubt PE, DVT.  Pneumonia, CHF  also in the differential.  Transferring to the [facility] the ER for comprehensive evaluation.  Feel that patient is stable to go by private vehicle.  Will notify charge nurse.  Discussed  MDM, treatment plan, and rationale for transfer to the ED with patient. . patient agrees with plan.   Meds ordered this encounter  Medications  . ipratropium-albuterol (DUONEB) 0.5-2.5 (3) MG/3ML nebulizer solution 3 mL  . predniSONE (DELTASONE) tablet 60 mg    *This clinic note was created using [employer]ist. Therefore, there may be occasional mistakes despite careful proofreading.   ?   Melynda Ripple, MD 08/22/17 [phone]

## 2017-08-22 NOTE — ED Provider Notes (Signed)
[facility] Emergency Department Provider Note       Time seen: ----------------------------------------- 6:20 PM on 08/22/2017 -----------------------------------------   I have reviewed the triage vital signs and the nursing notes.  HISTORY   Chief Complaint Shortness of Breath and Abdominal Pain    HPI CALEM COCOZZA is a 67 y.o. male with a history of COPD, coronary artery disease, diabetes and hypertension who presents to the ED for abdominal pain and distention for the past 3 weeks.  He is also had shortness of breath the last week.  He went to an urgent care today and sent him to the ER for further evaluation.  He had multiple breathing treatments and steroids prior to arrival.  Currently his shortness of breath appears to have improved, he is not hypoxic on arrival.  Past Medical History:  Diagnosis Date  . COPD (chronic obstructive pulmonary disease) ([place])   . Coronary artery disease   . Diabetes mellitus without complication ([place])   . Hypertension     Patient Active Problem List   Diagnosis Date Noted  . Hemoglobinuria 08/21/2016  . Anemia 08/07/2016  . Pneumonia 07/15/2016  . Acute respiratory failure with hypoxia ([place]) 12/27/2015  . Community acquired pneumonia 12/27/2015  . Acute respiratory distress 09/19/2015  . Adjustment disorder with mixed anxiety and depressed mood 04/14/2015  . Insomnia 04/14/2015  . Grief 04/14/2015  . Hyponatremia 04/12/2015  . COPD exacerbation ([place]) 10/08/2014  . CAP (community acquired pneumonia) 10/08/2014  . Hypogammaglobulinemia ([place]) 08/17/2014  . Leukocytosis 08/03/2014  . Recurrent infections 08/03/2014    Past Surgical History:  Procedure Laterality Date  . LUNG BIOPSY Right    2006  . right side partial lobe removed       Allergies Patient has no known allergies.  Social History Social History   Tobacco Use  . Smoking status: Former Smoker    Packs/day: 1.00    Years: 52.00    Pack  years: 52.00    Last attempt to quit: 06/18/2016    Years since quitting: 1.1  . Smokeless tobacco: Never Used  Substance Use Topics  . Alcohol use: No  . Drug use: No   Review of Systems Constitutional: Negative for fever. Cardiovascular: Negative for chest pain. Respiratory: Positive shortness of breath, cough Gastrointestinal: Positive for abdominal pain and distention Musculoskeletal: Negative for back pain. Skin: Negative for rash. Neurological: Negative for headaches, focal weakness or numbness.  All systems negative/normal/unremarkable except as stated in the HPI  ____________________________________________   PHYSICAL EXAM:  VITAL SIGNS: ED Triage Vitals  Enc Vitals Group     BP 08/22/17 1332 (!) 141/67     Pulse Rate 08/22/17 1332 79     Resp 08/22/17 1332 (!) 24     Temp 08/22/17 1332 98.4 F (36.9 C)     Temp Source 08/22/17 1332 Oral     SpO2 08/22/17 1332 94 %     Weight 08/22/17 1333 155 lb (70.3 kg)     Height 08/22/17 1333 5\' 7"  (1.702 m)     Head Circumference --      Peak Flow --      Pain Score 08/22/17 1333 5     Pain Loc --      Pain Edu? --      Excl. in [place]? --    Constitutional: Alert and oriented. Well appearing and in no distress. Eyes: Conjunctivae are normal. Normal extraocular movements. ENT   Head: Normocephalic and atraumatic.  Nose: No congestion/rhinnorhea.   Mouth/Throat: Mucous membranes are moist.   Neck: No stridor. Cardiovascular: Normal rate, regular rhythm. No murmurs, rubs, or gallops. Respiratory: Normal respiratory effort without tachypnea nor retractions.  Mild wheezing Gastrointestinal: Soft and nontender. Normal bowel sounds Musculoskeletal: Nontender with normal range of motion in extremities. No lower extremity tenderness nor edema. Neurologic:  Normal speech and language. No gross focal neurologic deficits are appreciated.  Skin:  Skin is warm, dry and intact. No rash noted. Psychiatric: Mood and  affect are normal. Speech and behavior are normal.  ____________________________________________  EKG: Interpreted by me.  Sinus rhythm rate of 70 bpm, right bundle branch block, wide QRS, normal QT  ____________________________________________  ED COURSE:  As part of my medical decision making, I reviewed the following data within the [place] History obtained from family if available, nursing notes, old chart and ekg, as well as notes from prior ED visits. Patient presented for COPD exacerbation and abdominal pain, we will assess with labs and imaging as indicated at this time.   Procedures ____________________________________________   LABS (pertinent positives/negatives)  Labs Reviewed  COMPREHENSIVE METABOLIC PANEL - Abnormal; Notable for the following components:      Result Value   Sodium 131 (*)    Chloride 99 (*)    CO2 21 (*)    Glucose, Bld 160 (*)    Calcium 8.8 (*)    All other components within normal limits  CBC - Abnormal; Notable for the following components:   WBC 11.1 (*)    All other components within normal limits  LIPASE, BLOOD  URINALYSIS, COMPLETE (UACMP) WITH MICROSCOPIC    RADIOLOGY CT renal protocol/CXR IMPRESSION: Severe diffuse bullous changes consistent COPD. Similar findings noted on prior exam. Bilateral pleural-parenchymal scarring, particular prominent right again noted. No definite infiltrate noted on today's exam.  IMPRESSION: 1. No urolithiasis, hydronephrosis, or other acute abnormality identified in the abdomen or pelvis. 2. Prior granulomatous infection. 3. Aortic Atherosclerosis (ICD10-I70.0) and Emphysema (ICD10-J43.9). Images were reviewed by me ____________________________________________  DIFFERENTIAL DIAGNOSIS   COPD exacerbation, pneumonia, PE, pneumothorax, constipation, AAA, renal colic  FINAL ASSESSMENT AND PLAN  COPD exacerbation, abdominal pain   Plan: The patient had presented for  abdominal pain as well as shortness of breath and cough. Patient's labs do not reveal any acute process. Patient's imaging was reassuring regarding CT of the abdomen.  His chest x-ray does show severe COPD.  He will be discharged with steroids, antibiotics but I am unclear was causing his abdominal pain.  I will prescribe laxative for him in case he is somewhat constipated.  Otherwise he is cleared for outpatient follow-up.   Laurence Aly, MD   Note: This note was generated in part or whole with voice recognition software. Voice recognition is usually quite accurate but there are transcription errors that can and very often do occur. I apologize for any typographical errors that were not detected and corrected.      Earleen Newport, MD 08/22/17 [place]

## 2017-08-22 NOTE — ED Triage Notes (Signed)
Patient states he has been congested for the past week and feeling like he can't get his breath. Patient also states he doesn't "feel" like himself.

## 2017-08-22 NOTE — ED Triage Notes (Signed)
Patient presents to the ED with abdominal pain/distention x 3 weeks and shortness of breath x 1 week.  Patient states he went to urgent care and they sent him to the ED.  Patient has history of COPD.

## 2017-08-27 DIAGNOSIS — J449 Chronic obstructive pulmonary disease, unspecified: Secondary | ICD-10-CM | POA: Diagnosis not present

## 2017-08-27 DIAGNOSIS — R0609 Other forms of dyspnea: Secondary | ICD-10-CM | POA: Diagnosis not present

## 2017-08-28 ENCOUNTER — Other Ambulatory Visit: Payer: Self-pay | Admitting: *Deleted

## 2017-08-28 NOTE — Patient Outreach (Signed)
[place] [name]) Care Management  08/28/2017  Bradley [name] November 22, 1950 [id number]  Referral via [name] Utilization Management -Bradley [name]; Reason: Member would like co pay assistance for inhalers:  Hx: COPD, CAD, DM2, HTN Recent ED visit 6/21 with abdominal pain & shortness of breath Final DX-COPD exacerbation & abdominal pain  Telephone call to patient who was advised of reason for call & Bradley [name] care management services. HIPPA verification received.  Patient states major health issue is COPD. States he quit smoking 14 months ago and now only uses O2 as needed.  States he is taking medications as ordered. States his concern is finances for co pays for his inhalers. States he is on fixed income and one co pay is $75.00 for month's supply. Voices he uses local pharmacy to fill prescriptions. States he needs assistance with Albuterol to use in nebulizer, Pro air, Spiriva and Advair.   States he drives and attends all appointments as scheduled.: attended lung specialist appointment 08/27/2017.   Recommendations for [place] services. Patient consents to services of Pharmacist for medication assistance and Health Coach for disease management.  Plan. Referral to Pharmacist and Health Coach. Telephonic signing off.   Bradley Daisy, RN BSN [place] Management Coordinator [name] Care Management  [phone]

## 2017-08-29 ENCOUNTER — Encounter: Payer: Self-pay | Admitting: *Deleted

## 2017-08-29 ENCOUNTER — Telehealth: Payer: Self-pay | Admitting: Nurse Practitioner

## 2017-09-01 ENCOUNTER — Telehealth: Payer: Self-pay | Admitting: *Deleted

## 2017-09-01 DIAGNOSIS — Z87891 Personal history of nicotine dependence: Secondary | ICD-10-CM

## 2017-09-01 DIAGNOSIS — Z122 Encounter for screening for malignant neoplasm of respiratory organs: Secondary | ICD-10-CM

## 2017-09-01 NOTE — Telephone Encounter (Signed)
Notified patient that annual lung cancer screening low dose CT scan is due currently or will be in near future. Confirmed that patient is within the age range of 55-77, and asymptomatic, (no signs or symptoms of lung cancer). Patient denies illness that would prevent curative treatment for lung cancer if found. Verified smoking history, (former, quit 4/15.18, 52 pack year). The shared decision making visit was done 09/03/16. Patient is agreeable for CT scan being scheduled.

## 2017-09-02 ENCOUNTER — Other Ambulatory Visit: Payer: Self-pay | Admitting: *Deleted

## 2017-09-02 ENCOUNTER — Encounter: Payer: Self-pay | Admitting: *Deleted

## 2017-09-02 NOTE — Patient Outreach (Signed)
[place] [facility]) Care Management  09/02/2017   Bradley [name] [birth year] [phone] RN Health Coach telephone call to patient.  Hipaa compliance verified. Per patient he is doing good. Patient hasn't had to use his oxygen in 14 months since stop smoking. Patient was not aware of zones and action plan for COPD. Patient is adhering to medications as prescribed. Patient is able to drive self to Dr appointments. Patient is unable to take tub baths or showers. Per patient the hot steamy water makes it hard to breathe so he takes wash up. Per patient when he is up walking his O2 SATs drop below 90. When he is sitting his SATs run 92-95%. Patient has not had any falls. Per patient he will be moving in 3 weeks. Patient has agreed to further outreach calls.  Current Medications:  Current Outpatient Medications  Medication Sig Dispense Refill  . albuterol (PROVENTIL) (2.5 MG/3ML) 0.083% nebulizer solution Take 2.5 mg by nebulization every 6 (six) hours as needed for wheezing or shortness of breath.    Marland Kitchen aspirin EC 81 MG tablet Take 81 mg by mouth daily.    . clopidogrel (PLAVIX) 75 MG tablet Take 75 mg by mouth daily.    . enalapril (VASOTEC) 10 MG tablet Take 10 mg by mouth daily.    . ferrous sulfate 325 (65 FE) MG EC tablet Take 1 tablet (325 mg total) by mouth daily. 30 tablet 3  . Fluticasone-Salmeterol (ADVAIR) 250-50 MCG/DOSE AEPB Inhale 1 puff into the lungs 2 (two) times daily. (Patient not taking: Reported on 08/22/2017) 60 each 0  . gabapentin (NEURONTIN) 300 MG capsule Take 1 capsule by mouth 3 (three) times daily.  3  . levofloxacin (LEVAQUIN) 750 MG tablet Take 1 tablet (750 mg total) by mouth daily. 5 tablet 0  . metFORMIN (GLUCOPHAGE) 500 MG tablet Take 1 tablet (500 mg total) by mouth 2 (two) times daily with a meal. Takes every morning and then takes another one in the evening if his "sugar starts acting up" (Patient not taking: Reported on 08/22/2017) 60 tablet 0  . metoprolol  (LOPRESSOR) 50 MG tablet Take 50 mg by mouth 2 (two) times daily.    . nitroGLYCERIN (NITROSTAT) 0.4 MG SL tablet Place 0.4 mg under the tongue every 5 (five) minutes as needed for chest pain.    Marland Kitchen ondansetron (ZOFRAN ODT) 4 MG disintegrating tablet Take 1 tablet (4 mg total) by mouth every 8 (eight) hours as needed for nausea or vomiting. 20 tablet 0  . polyethylene glycol (MIRALAX / GLYCOLAX) packet Take 17 g by mouth daily. 14 each 0  . predniSONE (STERAPRED UNI-PAK 21 TAB) 10 MG (21) TBPK tablet Take by mouth daily. Dispense steroid taper pack as directed 21 tablet 0  . PROAIR HFA 108 (90 Base) MCG/ACT inhaler Inhale 2 puffs into the lungs every 6 (six) hours as needed for wheezing or shortness of breath.    . simvastatin (ZOCOR) 20 MG tablet Take 20 mg by mouth at bedtime.     Marland Kitchen tiotropium (SPIRIVA) 18 MCG inhalation capsule Place 1 capsule (18 mcg total) into inhaler and inhale daily. (Patient not taking: Reported on 08/22/2017) 30 capsule 0   No current facility-administered medications for this visit.     Functional Status:  In your present state of health, do you have any difficulty performing the following activities: 09/02/2017  Hearing? N  Vision? N  Difficulty concentrating or making decisions? N  Walking or climbing stairs? Bradley [name]  Dressing or bathing? Y  Doing errands, shopping? N  Preparing Food and eating ? N  Using the Toilet? N  In the past six months, have you accidently leaked urine? N  Do you have problems with loss of bowel control? N  Managing your Medications? N  Managing your Finances? N  Housekeeping or managing your Housekeeping? N  Some recent data might be hidden    Fall/Depression Screening: Fall Risk  09/02/2017 08/28/2017 01/08/2016  Falls in the past year? No No No   PHQ 2/9 Scores 09/02/2017 08/28/2017 01/08/2016  PHQ - 2 Score 0 0 0   THN CM Care Plan Problem One     Most Recent Value  Care Plan Problem One  Knowledge Deficit in Self Management of COPD   Role Documenting the Problem One  [place] for Problem One  Active  THN Long Term Goal   Patient will not be readmitted for COPD within the next 90 days  THN Long Term Goal Start Date  09/02/17  Interventions for Problem One Long Term Goal  RN discussed medication adherence. RN discussed the zones and action plan of COPD. RN sent educational information on COPD.   THN CM Short Term Goal #1   Patient will have a better understanding of COPD zones and action plan within the next 30 days  THN CM Short Term Goal #1 Start Date  09/02/17  Interventions for Short Term Goal #1  RN discussed COPD zones and action plan. RN sent patient COPD packet. RN will follow up with further discussion and teach back  THN CM Short Term Goal #2   Patient will have a better understancing of purselip breathing within the next 30 days  Interventions for Short Term Goal #2  RN discussed the benefits of purselip breathin and how to do it. RN sent patient a picture sheet. RN will follow up with further discussion and teach back     Assessment:  Patient uses inhalers as per ordered Patient has O2 but has not had to use in 14 months Patient will benefit from [place] telephonic outreach for education and support for COPD self management. Plan: RN discussed COPD zones and action plan RN sent educational material on COPD RN sent COPD packet RN sent 2019 Calendar Book RN sent educational material on purse lip breathing RN sent assessment and barriers letter to PCP  RN will follow up within the month of August  Bradley [name] [place] Care Management [phone]

## 2017-09-03 ENCOUNTER — Other Ambulatory Visit: Payer: Self-pay | Admitting: Pharmacist

## 2017-09-03 NOTE — Patient Outreach (Signed)
[name]  09/03/2017  Bradley [name] June 13, 1950 [id number]   67 y.o. year old male referred to [place] for Medication Management (Initial pharmacy outreach) and Medication Assistance (inhalers)  Referred by Sherrin Daisy, [facility] RN, for medication assistance with inhalers.   Scheduled pharmacy outreach telephone call with the patient for Friday afternoon.   Ruben Reason, PharmD Clinical Pharmacist, [place] [facility] [phone]

## 2017-09-05 ENCOUNTER — Other Ambulatory Visit: Payer: Self-pay | Admitting: Pharmacist

## 2017-09-05 DIAGNOSIS — M5136 Other intervertebral disc degeneration, lumbar region: Secondary | ICD-10-CM | POA: Diagnosis not present

## 2017-09-05 DIAGNOSIS — M5416 Radiculopathy, lumbar region: Secondary | ICD-10-CM | POA: Diagnosis not present

## 2017-09-05 DIAGNOSIS — M5126 Other intervertebral disc displacement, lumbar region: Secondary | ICD-10-CM | POA: Diagnosis not present

## 2017-09-05 NOTE — Patient Outreach (Signed)
[place] [facility]) Care Management  [place]   09/05/2017  Bradley [name] [birth year] [id number]   67 y.o. year old male referred to [place] for Medication Assistance (Spiriva, Advair) Referred by Sherrin Daisy, [facility] RN, for medication assistance with inhalers.  PMH s/f: COPD, recurrent infetion, anxiety/depression, anemia  Patient with [employer] Medicare Advantage plan.   Patient confirms identity with HIPAA-identifiers x2 and gives verbal consent to speak over the phone about medications.    Subjective:   Medication Adherence: Patient states he is "mostly" adherent with his medications. He is worried about reaching the donut hole and will sometimes try to "conserve" medication doses.  Medication Assistance:  Patient needs assistance specifically with Spiriva and Advair inhalers. Albuterol is nebulizer (billed through part B) and patient states it is affordable to him. He is not having any issues with his meter strips wither (also billed part B). Advair costs $45 and Spiriva he thinks is $75.    Medication Management:  Patient manages his own medications.   Objective:   Current Medications: Current Outpatient Medications  Medication Sig Dispense Refill  . albuterol (PROVENTIL) (2.5 MG/3ML) 0.083% nebulizer solution Take 2.5 mg by nebulization every 6 (six) hours as needed for wheezing or shortness of breath.    Marland Kitchen aspirin EC 81 MG tablet Take 81 mg by mouth daily.    . clopidogrel (PLAVIX) 75 MG tablet Take 75 mg by mouth daily.    . enalapril (VASOTEC) 10 MG tablet Take 10 mg by mouth daily.    Marland Kitchen glucose blood ([employer] TEST STRIPS) test strip Use once daily. Use as instructed. DX: E11.9    . metFORMIN (GLUCOPHAGE) 500 MG tablet Take 1 tablet (500 mg total) by mouth 2 (two) times daily with a meal. Takes every morning and then takes another one in the evening if his "sugar starts acting up" (Patient taking differently: Take 500 mg by mouth daily.  Takes every morning and then takes another one in the evening if his "sugar starts acting up") 60 tablet 0  . metoprolol (LOPRESSOR) 50 MG tablet Take 50 mg by mouth 2 (two) times daily.    . nitroGLYCERIN (NITROSTAT) 0.4 MG SL tablet Place 0.4 mg under the tongue every 5 (five) minutes as needed for chest pain.    Marland Kitchen PROAIR HFA 108 (90 Base) MCG/ACT inhaler Inhale 2 puffs into the lungs every 6 (six) hours as needed for wheezing or shortness of breath.    . sildenafil (REVATIO) 20 MG tablet Take 20 mg by mouth daily as needed.    . simvastatin (ZOCOR) 20 MG tablet Take 20 mg by mouth at bedtime.     Marland Kitchen tiotropium (SPIRIVA) 18 MCG inhalation capsule Place 1 capsule (18 mcg total) into inhaler and inhale daily. 30 capsule 0  . ferrous sulfate 325 (65 FE) MG EC tablet Take 1 tablet (325 mg total) by mouth daily. (Patient not taking: Reported on 09/05/2017) 30 tablet 3  . Fluticasone-Salmeterol (ADVAIR) 250-50 MCG/DOSE AEPB Inhale 1 puff into the lungs 2 (two) times daily. (Patient not taking: Reported on 08/22/2017) 60 each 0  . gabapentin (NEURONTIN) 300 MG capsule Take 1 capsule by mouth 3 (three) times daily.  3   No current facility-administered medications for this visit.     Functional Status: In your present state of health, do you have any difficulty performing the following activities: 09/02/2017  Hearing? N  Vision? N  Difficulty concentrating or making decisions? N  Walking or  climbing stairs? Y  Dressing or bathing? Y  Doing errands, shopping? N  Preparing Food and eating ? N  Using the Toilet? N  In the past six months, have you accidently leaked urine? N  Do you have problems with loss of bowel control? N  Managing your Medications? N  Managing your Finances? N  Housekeeping or managing your Housekeeping? N  Some recent data might be hidden    Fall/Depression Screening: Fall Risk  09/02/2017 08/28/2017 01/08/2016  Falls in the past year? No No No   PHQ 2/9 Scores 09/02/2017  08/28/2017 01/08/2016  PHQ - 2 Score 0 0 0   Assessment:  Drugs sorted by system:  Cardiovascular: enalapril, simvastatin, clopidogrel, aspirin, metoprolol, nitroglycerin  Pulmonary/Allergy: albuterol neb, albuterol HFA, Spiriva, Advair  Endocrine: metformin Infectious Diseases:  Miscellaneous: Viagra   Duplications in therapy: albuterol neb/albuterol HFA; patient uses albuterol inhaler as needed when he is away from home Drug interactions: nitroglycerin and Viagra: provided extensive counseling.  Patient also assessed for Medicare Extra Help eligibility. He is a widower (household of 1). He is over the income limit to apply to Extra Help.   Plan: 1. Contact Dr. Raul Del and Dr. Kym Groom to find regular prescriber for patient's inhaler (Advair was prescribed in hospital).  2. Proceed with patient assistance applications for Spiriva, Advair, and Proventil.  3. Provided counseling on medications, Medicare Part D, and patient's prescription benefits.  4. Follow up in 1-2 business days.   Ruben Reason, PharmD Clinical Pharmacist, [place] [facility] [phone]

## 2017-09-08 ENCOUNTER — Other Ambulatory Visit: Payer: Self-pay | Admitting: Pharmacist

## 2017-09-08 ENCOUNTER — Other Ambulatory Visit: Payer: Self-pay | Admitting: Pharmacy Technician

## 2017-09-08 NOTE — Patient Outreach (Signed)
[place] [facility]) Care Management  09/08/2017  Bradley [name] 09-26-1950 [id number]   Spoke with Dr. Gust Brooms office (pulmonology- [facility]). Dr. Raul Del will be taking over the patient's prescriptions for Advair, Spiriva, and albuterol nebulizer. Will proceed with medication assistance applications for Advair, Spiriva.   Medication Assistance Program Info:  Advair ([place]): proof of income, spent $600 on prescriptions (EOB or TROOP) Spiriva ([place]): proof of income  Plan: Will proceed with medication assistance applications through [place] and [place] for Advair and Spiriva, facilitated by Etter Sjogren, [facility] CPhT.   Ruben Reason, PharmD Clinical Pharmacist, [place] [facility] [phone]

## 2017-09-08 NOTE — Patient Outreach (Signed)
[place] [facility]) Care Management  09/08/2017  NAVARRO NINE 1950-03-11 [id number]   Received [place] and [employer] patient assistance referral from [place] for Ventolin, Advair and Spiriva. Prepared patient portion of applications to be mailed out. Faxed provider portions into Dr. Raul Del.  Will follow up with patient in the next 5-7 business days to confirm applications have been received.  Maud Deed [place], [place] Management [phone]

## 2017-09-10 ENCOUNTER — Ambulatory Visit: Payer: PPO

## 2017-09-12 ENCOUNTER — Ambulatory Visit
Admission: RE | Admit: 2017-09-12 | Discharge: 2017-09-12 | Disposition: A | Discharge status: Home / Self Care | Payer: PPO | Source: Ambulatory Visit | Attending: Oncology | Admitting: Oncology

## 2017-09-12 DIAGNOSIS — Z87891 Personal history of nicotine dependence: Secondary | ICD-10-CM | POA: Diagnosis not present

## 2017-09-12 DIAGNOSIS — I700 Atherosclerosis of aorta: Secondary | ICD-10-CM | POA: Diagnosis not present

## 2017-09-12 DIAGNOSIS — J439 Emphysema, unspecified: Secondary | ICD-10-CM | POA: Insufficient documentation

## 2017-09-12 DIAGNOSIS — Z122 Encounter for screening for malignant neoplasm of respiratory organs: Secondary | ICD-10-CM | POA: Diagnosis not present

## 2017-09-13 DIAGNOSIS — R0603 Acute respiratory distress: Secondary | ICD-10-CM | POA: Diagnosis not present

## 2017-09-13 DIAGNOSIS — J449 Chronic obstructive pulmonary disease, unspecified: Secondary | ICD-10-CM | POA: Diagnosis not present

## 2017-09-15 ENCOUNTER — Encounter: Payer: Self-pay | Admitting: *Deleted

## 2017-10-01 DIAGNOSIS — M5136 Other intervertebral disc degeneration, lumbar region: Secondary | ICD-10-CM | POA: Diagnosis not present

## 2017-10-01 DIAGNOSIS — M5416 Radiculopathy, lumbar region: Secondary | ICD-10-CM | POA: Diagnosis not present

## 2017-10-01 DIAGNOSIS — M6281 Muscle weakness (generalized): Secondary | ICD-10-CM | POA: Diagnosis not present

## 2017-10-06 ENCOUNTER — Other Ambulatory Visit: Payer: Self-pay | Admitting: *Deleted

## 2017-10-06 DIAGNOSIS — H348112 Central retinal vein occlusion, right eye, stable: Secondary | ICD-10-CM | POA: Diagnosis not present

## 2017-10-06 DIAGNOSIS — H3554 Dystrophies primarily involving the retinal pigment epithelium: Secondary | ICD-10-CM | POA: Diagnosis not present

## 2017-10-06 NOTE — Patient Outreach (Signed)
[place] [facility]) Care Management  10/06/2017   Bradley [name] [birth year] [id number]  RN Health Coach telephone call to patient.  Hipaa compliance verified. Per patient he is doing good. Patient stated he is staying inside to prevent heat exhaustion. Patient verbalized receiving educational material. Patient stated he is using his inhalers . Patient has not moved but will within the month. Per patient he has kept his follow up appointments. Patient stated he is being assisted with medication and is very grateful for the assistance. Patient has agreed to follow up outreach calls.   Current Medications:  Current Outpatient Medications  Medication Sig Dispense Refill  . albuterol (PROVENTIL) (2.5 MG/3ML) 0.083% nebulizer solution Take 2.5 mg by nebulization every 6 (six) hours as needed for wheezing or shortness of breath.    Marland Kitchen aspirin EC 81 MG tablet Take 81 mg by mouth daily.    . clopidogrel (PLAVIX) 75 MG tablet Take 75 mg by mouth daily.    . enalapril (VASOTEC) 10 MG tablet Take 10 mg by mouth daily.    . ferrous sulfate 325 (65 FE) MG EC tablet Take 1 tablet (325 mg total) by mouth daily. (Patient not taking: Reported on 09/05/2017) 30 tablet 3  . Fluticasone-Salmeterol (ADVAIR) 250-50 MCG/DOSE AEPB Inhale 1 puff into the lungs 2 (two) times daily. (Patient not taking: Reported on 08/22/2017) 60 each 0  . gabapentin (NEURONTIN) 300 MG capsule Take 1 capsule by mouth 3 (three) times daily.  3  . glucose blood ([employer] TEST STRIPS) test strip Use once daily. Use as instructed. DX: E11.9    . metFORMIN (GLUCOPHAGE) 500 MG tablet Take 1 tablet (500 mg total) by mouth 2 (two) times daily with a meal. Takes every morning and then takes another one in the evening if his "sugar starts acting up" (Patient taking differently: Take 500 mg by mouth daily. Takes every morning and then takes another one in the evening if his "sugar starts acting up") 60 tablet 0  . metoprolol (LOPRESSOR) 50 MG  tablet Take 50 mg by mouth 2 (two) times daily.    . nitroGLYCERIN (NITROSTAT) 0.4 MG SL tablet Place 0.4 mg under the tongue every 5 (five) minutes as needed for chest pain.    Marland Kitchen PROAIR HFA 108 (90 Base) MCG/ACT inhaler Inhale 2 puffs into the lungs every 6 (six) hours as needed for wheezing or shortness of breath.    . sildenafil (REVATIO) 20 MG tablet Take 20 mg by mouth daily as needed.    . simvastatin (ZOCOR) 20 MG tablet Take 20 mg by mouth at bedtime.     Marland Kitchen tiotropium (SPIRIVA) 18 MCG inhalation capsule Place 1 capsule (18 mcg total) into inhaler and inhale daily. 30 capsule 0   No current facility-administered medications for this visit.     Functional Status:  In your present state of health, do you have any difficulty performing the following activities: 09/02/2017  Hearing? N  Vision? N  Difficulty concentrating or making decisions? N  Walking or climbing stairs? Y  Dressing or bathing? Y  Doing errands, shopping? N  Preparing Food and eating ? N  Using the Toilet? N  In the past six months, have you accidently leaked urine? N  Do you have problems with loss of bowel control? N  Managing your Medications? N  Managing your Finances? N  Housekeeping or managing your Housekeeping? N  Some recent data might be hidden    Fall/Depression Screening: Fall Risk  10/06/2017  09/02/2017 08/28/2017  Falls in the past year? No No No   PHQ 2/9 Scores 10/06/2017 09/02/2017 08/28/2017 01/08/2016  PHQ - 2 Score 0 0 0 0   THN CM Care Plan Problem One     Most Recent Value  Care Plan Problem One  Knowledge Deficit in Self Management of COPD  Role Documenting the Problem One  [place] for Problem One  Active  THN Long Term Goal   Patient will not be readmitted for COPD within the next 90 days  Interventions for Problem One Long Term Goal  RN reiterated the zones of CHF. RN discussed medication adherence. RN will follow up with further discussion and teach back  THN CM Short Term  Goal #1   Patient will have a better understanding of COPD zones and action plan within the next 30 days  THN CM Short Term Goal #1 Start Date  10/06/17  Interventions for Short Term Goal #1  Patient needs reiterating to be able to remember and follow through. RN will follow up with further discussion and teach back  THN CM Short Term Goal #2   Patient will have a better understancing of purselip breathing within the next 30 days  THN CM Short Term Goal #2 Start Date  10/06/17  Interventions for Short Term Goal #2  RN reiteratered purselip breathing. Patient voiced receiving additonal educational material. RN will follow up with further discussion and teach back      Assessment:  Patient has not had any readmissions within the last 30 days Patient is staying inside to prevent heat exhaustion Patient is using inhalers as prescribed Patient will continue to  benefit from [place] telephonic outreach for education and support for COPD self management.   Plan:  RN reiterated the zones and action plan RN reiterated medication adherence RN will follow up with further discussion and teach back RN will outreach within the month of September  Bradley [name] Management [phone]

## 2017-10-07 DIAGNOSIS — M5136 Other intervertebral disc degeneration, lumbar region: Secondary | ICD-10-CM | POA: Diagnosis not present

## 2017-10-07 DIAGNOSIS — M6281 Muscle weakness (generalized): Secondary | ICD-10-CM | POA: Diagnosis not present

## 2017-10-07 DIAGNOSIS — M5416 Radiculopathy, lumbar region: Secondary | ICD-10-CM | POA: Diagnosis not present

## 2017-10-08 ENCOUNTER — Encounter: Payer: Self-pay | Admitting: Hematology and Oncology

## 2017-10-08 ENCOUNTER — Inpatient Hospital Stay (HOSPITAL_BASED_OUTPATIENT_CLINIC_OR_DEPARTMENT_OTHER): Payer: PPO | Admitting: Hematology and Oncology

## 2017-10-08 ENCOUNTER — Inpatient Hospital Stay: Payer: PPO | Attending: Hematology and Oncology

## 2017-10-08 VITALS — BP 112/68 | HR 73 | Temp 96.6°F | Wt 157.2 lb

## 2017-10-08 DIAGNOSIS — Z79899 Other long term (current) drug therapy: Secondary | ICD-10-CM

## 2017-10-08 DIAGNOSIS — I2510 Atherosclerotic heart disease of native coronary artery without angina pectoris: Secondary | ICD-10-CM | POA: Diagnosis not present

## 2017-10-08 DIAGNOSIS — J449 Chronic obstructive pulmonary disease, unspecified: Secondary | ICD-10-CM | POA: Diagnosis not present

## 2017-10-08 DIAGNOSIS — E119 Type 2 diabetes mellitus without complications: Secondary | ICD-10-CM | POA: Diagnosis not present

## 2017-10-08 DIAGNOSIS — I10 Essential (primary) hypertension: Secondary | ICD-10-CM | POA: Diagnosis not present

## 2017-10-08 DIAGNOSIS — R823 Hemoglobinuria: Secondary | ICD-10-CM

## 2017-10-08 DIAGNOSIS — D509 Iron deficiency anemia, unspecified: Secondary | ICD-10-CM | POA: Diagnosis not present

## 2017-10-08 DIAGNOSIS — Z7982 Long term (current) use of aspirin: Secondary | ICD-10-CM | POA: Insufficient documentation

## 2017-10-08 DIAGNOSIS — Z7984 Long term (current) use of oral hypoglycemic drugs: Secondary | ICD-10-CM | POA: Insufficient documentation

## 2017-10-08 DIAGNOSIS — E538 Deficiency of other specified B group vitamins: Secondary | ICD-10-CM

## 2017-10-08 DIAGNOSIS — Z87891 Personal history of nicotine dependence: Secondary | ICD-10-CM | POA: Diagnosis not present

## 2017-10-08 LAB — CBC WITH DIFFERENTIAL/PLATELET
Basophils Absolute: 0.1 10*3/uL (ref 0–0.1)
Basophils Relative: 1 %
Eosinophils Absolute: 0.1 10*3/uL (ref 0–0.7)
Eosinophils Relative: 2 %
HCT: 45.2 % (ref 40.0–52.0)
Hemoglobin: 15 g/dL (ref 13.0–18.0)
Lymphocytes Relative: 17 %
Lymphs Abs: 1.5 10*3/uL (ref 1.0–3.6)
MCH: 29 pg (ref 26.0–34.0)
MCHC: 33.1 g/dL (ref 32.0–36.0)
MCV: 87.8 fL (ref 80.0–100.0)
Monocytes Absolute: 0.9 10*3/uL (ref 0.2–1.0)
Monocytes Relative: 10 %
Neutro Abs: 6 10*3/uL (ref 1.4–6.5)
Neutrophils Relative %: 70 %
Platelets: 291 10*3/uL (ref 150–440)
RBC: 5.16 MIL/uL (ref 4.40–5.90)
RDW: 15.1 % — ABNORMAL HIGH (ref 11.5–14.5)
WBC: 8.6 10*3/uL (ref 3.8–10.6)

## 2017-10-08 LAB — FERRITIN: Ferritin: 31 ng/mL (ref 24–336)

## 2017-10-08 NOTE — Progress Notes (Signed)
Patient offers no complaints today. 

## 2017-10-08 NOTE — Progress Notes (Signed)
[facility] day:  10/08/2017  Chief Complaint: Bradley [name] is a 67 y.o. male with iron deficiency anemia who is seen for 6 month assessment.   HPI:  The patient was last seen in the hematology clinic on 03/19/2017.  At that time, he denied any B symptoms. Patient had pain in his hips. Exam was unremarkable. CBC was normal. Ferritin was 37.  Urinalysis revealed trace hemoglobin.  IgG was 711 (normal).  CBC on 06/18/2017 revealed a hematocrit of 42.8, hemoglobin 14.4, and MCV 89.5.  Ferritin was 23.  He was contacted to restart his iron.  Low dose chest CT on 09/12/2017 revealed Lung-RADS 2, benign appearance or behavior. There was scattered bilateral pulmonary nodules (up to 5.2 mm).  There was a stable 1.1 cm nodular opacity in the lateral RLL (favored RML).  Continue annual screening with low-dose chest CT without contrast in 12 months.  He was seen in the [facility] ER on 08/22/2017 with shortness of breath and abdominal pain.  He was felt to have a COPD exacerbation.  He received several breathing treatments and steroids.  Renal stone study revealed no urolithiasis, hydronephrosis or other acute abnormality.  CBC revealed a hematocrit of 44.5, hemoglobin 15, and MCV 89.1.  Urinalysis revealed a small amount of hemoglobin.  During the interim, patient states, "I am doing real good today". Patient has no acute complaints today. He notes a chronic COPD related cough. He denies any increased shortness of breath. No addition AECOPD since ED visit in 08/2017. Previously reported pain in his hips resolved following cortisone injections.   Patient denies that he has experienced any B symptoms. He denies any interval infections. Patient advises that he maintains an adequate appetite. He is eating well. Weight today is 157 lb 3 oz (71.3 kg), which compared to his last visit to the clinic, represents a  2 pound increase. He is only taking oral iron supplement "every once  in a while when he thinks of it".   Patient denies pain in the clinic today.   Past Medical History:  Diagnosis Date  . COPD (chronic obstructive pulmonary disease) ([place])   . Coronary artery disease   . Diabetes mellitus without complication ([place])   . Hypertension     Past Surgical History:  Procedure Laterality Date  . LUNG BIOPSY Right    2006  . right side partial lobe removed       Family History  Problem Relation Age of Onset  . Cancer Mother   . Heart attack Father   . Cancer Sister     Social History:  reports that he quit smoking about 15 months ago. He has a 52.00 pack-year smoking history. He has never used smokeless tobacco. He reports that he does not drink alcohol or use drugs.  He smoked 1 pack/day since age 34 until 9 weeks ago.  He quit smoking on 06/18/2016 at 10 AM.  He denies any exposure to radiation or toxins.  He is retired from working for the [employer].  His wife died 55 months ago with "cancer in the blood" (? leukemia/lymphoma).  The patient is alone today.  Allergies: No Known Allergies  Current Medications: Current Outpatient Medications  Medication Sig Dispense Refill  . albuterol (PROVENTIL) (2.5 MG/3ML) 0.083% nebulizer solution Take 2.5 mg by nebulization every 6 (six) hours as needed for wheezing or shortness of breath.    Marland Kitchen aspirin EC 81 MG tablet Take 81  mg by mouth daily.    . clopidogrel (PLAVIX) 75 MG tablet Take 75 mg by mouth daily.    . enalapril (VASOTEC) 10 MG tablet Take 10 mg by mouth daily.    . ferrous sulfate 325 (65 FE) MG EC tablet Take 1 tablet (325 mg total) by mouth daily. 30 tablet 3  . Fluticasone-Salmeterol (ADVAIR) 250-50 MCG/DOSE AEPB Inhale 1 puff into the lungs 2 (two) times daily. 60 each 0  . gabapentin (NEURONTIN) 300 MG capsule Take 1 capsule by mouth 3 (three) times daily.  3  . glucose blood (KROGER TEST STRIPS) test strip Use once daily. Use as instructed. DX: E11.9    . metFORMIN (GLUCOPHAGE) 500 MG  tablet Take 1 tablet (500 mg total) by mouth 2 (two) times daily with a meal. Takes every morning and then takes another one in the evening if his "sugar starts acting up" (Patient taking differently: Take 500 mg by mouth daily. Takes every morning and then takes another one in the evening if his "sugar starts acting up") 60 tablet 0  . metoprolol (LOPRESSOR) 50 MG tablet Take 50 mg by mouth 2 (two) times daily.    . nitroGLYCERIN (NITROSTAT) 0.4 MG SL tablet Place 0.4 mg under the tongue every 5 (five) minutes as needed for chest pain.    Marland Kitchen PROAIR HFA 108 (90 Base) MCG/ACT inhaler Inhale 2 puffs into the lungs every 6 (six) hours as needed for wheezing or shortness of breath.    . sildenafil (REVATIO) 20 MG tablet Take 20 mg by mouth daily as needed.    . simvastatin (ZOCOR) 20 MG tablet Take 20 mg by mouth at bedtime.     Marland Kitchen tiotropium (SPIRIVA) 18 MCG inhalation capsule Place 1 capsule (18 mcg total) into inhaler and inhale daily. 30 capsule 0   No current facility-administered medications for this visit.     Review of Systems  Constitutional: Negative for diaphoresis, fever, malaise/fatigue and weight loss (weight up 2 pounds).       "I'm doing real good today"  HENT: Negative.   Eyes: Negative.   Respiratory: Positive for cough (COPD ) and shortness of breath (exertional). Negative for hemoptysis and sputum production.   Cardiovascular: Negative for chest pain, palpitations, orthopnea, leg swelling and PND.  Gastrointestinal: Negative for abdominal pain, blood in stool, constipation, diarrhea, melena, nausea and vomiting.  Genitourinary: Negative for dysuria, frequency, hematuria and urgency.  Musculoskeletal: Negative for back pain, falls and myalgias.       Hip pain improved following steroid injections.   Skin: Negative for itching and rash.  Neurological: Negative for dizziness, tremors, weakness and headaches.  Endo/Heme/Allergies: Does not bruise/bleed easily.   Psychiatric/Behavioral: Negative for depression, memory loss and suicidal ideas. The patient is not nervous/anxious and does not have insomnia.   All other systems reviewed and are negative.  Performance status (ECOG): 0 - Asymptomatic  Vital Signs BP 112/68 (BP Location: Left Arm, Patient Position: Sitting)   Pulse 73   Temp (!) 96.6 F (35.9 C) (Tympanic)   Wt 157 lb 3 oz (71.3 kg)   SpO2 94%   BMI 24.62 kg/m   Physical Exam  Constitutional: He is oriented to person, place, and time and well-developed, well-nourished, and in no distress.  HENT:  Head: Normocephalic and atraumatic.  Gray hair.  Male pattern baldness.  Eyes: Pupils are equal, round, and reactive to light. EOM are normal. No scleral icterus.  Glasses (new bifocals). Blue eyes.   Neck:  Normal range of motion. Neck supple. No tracheal deviation present. No thyromegaly present.  Cardiovascular: Normal rate, regular rhythm and normal heart sounds. Exam reveals no gallop and no friction rub.  No murmur heard. Pulmonary/Chest: Effort normal and breath sounds normal. No respiratory distress. He has no wheezes. He has no rales.  Abdominal: Soft. Bowel sounds are normal. He exhibits no distension. There is no tenderness.  Musculoskeletal: Normal range of motion. He exhibits no edema or tenderness.  Neurological: He is alert and oriented to person, place, and time.  Skin: Skin is warm and dry. No rash noted. No erythema.  Psychiatric: Mood, affect and judgment normal.  Nursing note and vitals reviewed.   Appointment on 10/08/2017  Component Date Value Ref Range Status  . WBC 10/08/2017 8.6  3.8 - 10.6 K/uL Final  . RBC 10/08/2017 5.16  4.40 - 5.90 MIL/uL Final  . Hemoglobin 10/08/2017 15.0  13.0 - 18.0 g/dL Final  . HCT 10/08/2017 45.2  40.0 - 52.0 % Final  . MCV 10/08/2017 87.8  80.0 - 100.0 fL Final  . MCH 10/08/2017 29.0  26.0 - 34.0 pg Final  . MCHC 10/08/2017 33.1  32.0 - 36.0 g/dL Final  . RDW 10/08/2017 15.1*  11.5 - 14.5 % Final  . Platelets 10/08/2017 291  150 - 440 K/uL Final  . Neutrophils Relative % 10/08/2017 70  % Final  . Neutro Abs 10/08/2017 6.0  1.4 - 6.5 K/uL Final  . Lymphocytes Relative 10/08/2017 17  % Final  . Lymphs Abs 10/08/2017 1.5  1.0 - 3.6 K/uL Final  . Monocytes Relative 10/08/2017 10  % Final  . Monocytes Absolute 10/08/2017 0.9  0.2 - 1.0 K/uL Final  . Eosinophils Relative 10/08/2017 2  % Final  . Eosinophils Absolute 10/08/2017 0.1  0 - 0.7 K/uL Final  . Basophils Relative 10/08/2017 1  % Final  . Basophils Absolute 10/08/2017 0.1  0 - 0.1 K/uL Final   Performed at [facility], [address], [place], [place] [address]    Assessment:  JACQUELYN ANTONY is a 67 y.o. male with mild anemia with progressive microcytic indices over the past 2 years.  Diet is good.  He denies any melena, hematochezia or hematuria.  Colonoscopy on 07/01/2012 revealed diverticulosis.  He believes he had an EGD 10 years ago.  Work-up on 08/07/2016 revealed a hematocrit of 41.5, hemoglobin 13.7, MCV 81.9, platelets 337,000, white count 9300 with an [place] of 5800.  Normal studies included:  SPEP, folate, iron saturation (24%), TIBC (399), sed rate, retic (1.3%), TSH.  He has mild hypogammaglobulinemia with an IgG of 601 (700 - 1600).  IgG was 562 on 08/03/2014.  Ferritin was 16 (low).  B12 was 319 (low normal) with a normal MMA r/o B12 deficiency.  Free light chains were normal. 24 hour UPEP revealed 112 mg/24 hours (30-150).  Immunofixation was normal.  Urinalysis revealed trace hemoglobin.  One month prior urine hemoglobin was small.  Guaiac cards were negative x 3. Patient currently on Ferrous Sulfate supplementation; not taking with Vitamin C. Discussed dietary recommendations.   Ferritin has been followed: 16 on 08/07/2016, 30 on 11/19/2016, 37 on 03/19/2017, 23 on 06/18/2017, and 31 on 10/08/2017.  He has been hospitalized 2 times/year in the past 3 years for pneumonia. He has COPD.  He  denies any other infections. IgG was 711 (normal) on 03/19/2017.  PET scan at [facility] on 07/12/2016 revealed postsurgical changes status post partial lower lobe resection.  There was heterogeneous opacity at the resection site favoring to represent atelectasis with  possible superimposed inflammatory/infectious process. There were other small nodules within the right lower lobe demonstrating minimal activity and stable.  Low dose chest CT on 09/03/2016 demonstrated small mediastinal lymph nodes that did not meet pathologic CT size criteria. There were scattered bilateral pulmonary nodules measuring up to 5.3 mm noted in the RIGHT middle lobe. There was a 6.3 mm calcified granuloma in the posterior LEFT upper lobe.   Low dose chest CT on 09/12/2017 revealed Lung-RADS 2, benign appearance or behavior. There was scattered bilateral pulmonary nodules (up to 5.2 mm).  There was a stable 1.1 cm nodular opacity in the lateral RLL (favored RML).  Continue annual screening with low-dose chest CT without contrast in 12 months was recommended.  Symptomatically, patient is doing well overall. Previously reported hip pain has resolved following steroid injections. Patient denies that he has experienced any B symptoms. He denies any interval infections. Exam is unremarkable.   Plan: 1. Labs today:  CBC with diff, ferritin. 2. IDA - stable  Hemoglobin 15.0, hematocrit 45.2, MCV 87.8  Discuss iron stores. Ferritin low at 31.   Patient is not consistently taking oral iron as prescribed. Patient encouraged to take oral iron supplement with a source of vitamin C daily. 3. Hemoglobinuria - ongoing  Recent UA in June continued to be (+) for hemoglobin. He was referred to nephrology, however never attended scheduled consult. Discussed further today and patient is amenable to consult at this time. Will reschedule.  4. B12 deficiency - ongoing  B12 level previous low, with a normal MMA.   Patient not taking B12 as  directed. Encouraged to restart B12 1,000 mcg supplement daily.   Will plan on recheck B12 level with next lab draw. 5. Discuss interval low dose chest CT.  Continue annual follow-up. 6. RTC in 3 months for MD assessment and  labs (CBC with diff, ferritin, B12).   Honor Loh, NP  10/08/2017, 11:31 AM   I saw and evaluated the patient, participating in the key portions of the service and reviewing pertinent diagnostic studies and records.  I reviewed the nurse practitioner's note and agree with the findings and the plan.  The assessment and plan were discussed with the patient.  A few questions were asked by the patient and answered.   Nolon Stalls, MD 10/08/2017,11:31 AM

## 2017-10-09 ENCOUNTER — Telehealth: Payer: Self-pay | Admitting: *Deleted

## 2017-10-09 DIAGNOSIS — M6281 Muscle weakness (generalized): Secondary | ICD-10-CM | POA: Diagnosis not present

## 2017-10-09 DIAGNOSIS — M5416 Radiculopathy, lumbar region: Secondary | ICD-10-CM | POA: Diagnosis not present

## 2017-10-09 DIAGNOSIS — M5136 Other intervertebral disc degeneration, lumbar region: Secondary | ICD-10-CM | POA: Diagnosis not present

## 2017-10-09 NOTE — Telephone Encounter (Signed)
-----   Message from Lequita Asal, MD sent at 10/09/2017  3:11 PM EDT ----- Regarding: Please call patient with ferritin level  Ferritin borderline.  Goal ferritin 100.  Continue oral iron.  M  ----- Message ----- From: Buel Ream, [facility] [place] Sent: 10/08/2017  10:32 AM EDT To: Lequita Asal, MD

## 2017-10-09 NOTE — Telephone Encounter (Signed)
Called patient to inform him that his ferritin is borderline.  Patient instructed to continue taking oral iron with Vitamin C or OJ.  Verblaized understanding.

## 2017-10-10 ENCOUNTER — Other Ambulatory Visit: Payer: Self-pay | Admitting: Pharmacist

## 2017-10-10 NOTE — Patient Outreach (Signed)
[facility] [facility]) Care Management  10/10/2017  HERSCHELL VIRANI 08-11-1950 [id number]   Follow up call placed to patient to confirm receipt of [place] and [place] medication assistance applications.   Patient verified self with HIPAA identifiers x 2. Gave verbal consent to speak over the phone about medications.   Patient states that he has received applications. We reviewed how to fill out the application, the necessary information to include (2018 tax return), and how to mail it back. Patient verbalized understanding.   Will follow up as necessary as medication assistance application submission will be facilitated by Etter Sjogren, [facility] CPhT.   Ruben Reason, PharmD Clinical Pharmacist, [place] [facility] [phone]

## 2017-10-14 DIAGNOSIS — J449 Chronic obstructive pulmonary disease, unspecified: Secondary | ICD-10-CM | POA: Diagnosis not present

## 2017-10-14 DIAGNOSIS — R0603 Acute respiratory distress: Secondary | ICD-10-CM | POA: Diagnosis not present

## 2017-11-05 DIAGNOSIS — R3129 Other microscopic hematuria: Secondary | ICD-10-CM | POA: Diagnosis not present

## 2017-11-06 ENCOUNTER — Other Ambulatory Visit: Payer: Self-pay | Admitting: *Deleted

## 2017-11-06 NOTE — Patient Outreach (Signed)
[place] [facility]) Care Management  11/06/2017   Bradley [name] 1951/02/09 [id number]  RN Health Coach telephone call to patient.  [place] compliance verified.  Per patient he is doing real good. Patient stated he is riding in the car. Patient is in the green zone. Patient has not had any recent falls. Patient has not had any readmissions since last outreach. RN discussed with patient about following up and getting flu shot. Patient has agreed to follow up outreach call.  Current Medications:  Current Outpatient Medications  Medication Sig Dispense Refill  . albuterol (PROVENTIL) (2.5 MG/3ML) 0.083% nebulizer solution Take 2.5 mg by nebulization every 6 (six) hours as needed for wheezing or shortness of breath.    Marland Kitchen aspirin EC 81 MG tablet Take 81 mg by mouth daily.    . clopidogrel (PLAVIX) 75 MG tablet Take 75 mg by mouth daily.    . enalapril (VASOTEC) 10 MG tablet Take 10 mg by mouth daily.    . ferrous sulfate 325 (65 FE) MG EC tablet Take 1 tablet (325 mg total) by mouth daily. 30 tablet 3  . Fluticasone-Salmeterol (ADVAIR) 250-50 MCG/DOSE AEPB Inhale 1 puff into the lungs 2 (two) times daily. 60 each 0  . gabapentin (NEURONTIN) 300 MG capsule Take 1 capsule by mouth 3 (three) times daily.  3  . glucose blood (KROGER TEST STRIPS) test strip Use once daily. Use as instructed. DX: E11.9    . metFORMIN (GLUCOPHAGE) 500 MG tablet Take 1 tablet (500 mg total) by mouth 2 (two) times daily with a meal. Takes every morning and then takes another one in the evening if his "sugar starts acting up" (Patient taking differently: Take 500 mg by mouth daily. Takes every morning and then takes another one in the evening if his "sugar starts acting up") 60 tablet 0  . metoprolol (LOPRESSOR) 50 MG tablet Take 50 mg by mouth 2 (two) times daily.    . nitroGLYCERIN (NITROSTAT) 0.4 MG SL tablet Place 0.4 mg under the tongue every 5 (five) minutes as needed for chest pain.    Marland Kitchen PROAIR HFA 108 (90 Base)  MCG/ACT inhaler Inhale 2 puffs into the lungs every 6 (six) hours as needed for wheezing or shortness of breath.    . sildenafil (REVATIO) 20 MG tablet Take 20 mg by mouth daily as needed.    . simvastatin (ZOCOR) 20 MG tablet Take 20 mg by mouth at bedtime.     Marland Kitchen tiotropium (SPIRIVA) 18 MCG inhalation capsule Place 1 capsule (18 mcg total) into inhaler and inhale daily. 30 capsule 0   No current facility-administered medications for this visit.     Functional Status:  In your present state of health, do you have any difficulty performing the following activities: 09/02/2017  Hearing? N  Vision? N  Difficulty concentrating or making decisions? N  Walking or climbing stairs? Y  Dressing or bathing? Y  Doing errands, shopping? N  Preparing Food and eating ? N  Using the Toilet? N  In the past six months, have you accidently leaked urine? N  Do you have problems with loss of bowel control? N  Managing your Medications? N  Managing your Finances? N  Housekeeping or managing your Housekeeping? N  Some recent data might be hidden    Fall/Depression Screening: Fall Risk  10/06/2017 09/02/2017 08/28/2017  Falls in the past year? No No No   PHQ 2/9 Scores 10/06/2017 09/02/2017 08/28/2017 01/08/2016  PHQ - 2 Score 0  0 0 0   THN CM Care Plan Problem One     Most Recent Value  Care Plan Problem One  Knowledge Deficit in Self Management of COPD  Role Documenting the Problem One  Health Coach  Care Plan for Problem One  Active  THN Long Term Goal   Patient will not be readmitted for COPD within the next 90 days  Interventions for Problem One Long Term Goal  RN reiterated medicationa adherence and the zones of COPD and action plan. RN will follow up further discussion and compliances  THN CM Short Term Goal #1   Patient will have a better understanding of COPD zones and action plan within the next 30 days  THN CM Short Term Goal #1 Met Date  11/06/17  [facility] CM Short Term Goal #2   Patient will have a  better understancing of purselip breathing within the next 30 days  THN CM Short Term Goal #2 Start Date  11/06/17  [facility] CM Short Term Goal #2 Met Date  11/06/17  THN CM Short Term Goal #3  Patient will verbalize receiving flu shot within the next 30 days  THN CM Short Term Goal #3 Start Date  11/06/17  Interventions for Short Tern Goal #3  RN discussed getting the flu shot.Patient stated he would get later this month. RN will follow up for compliances  THN CM Short Term Goal #4  Patient will verbalize signs and symptoms of COPD exacerbation and what to look for within the next 30 days  THN CM Short Term Goal #4 Start Date  11/06/17  Interventions for Short Term Goal #4  RN discssed COPD exacerbation. Patient will follow the copd action plan and report to physcian. RN sent educational material on copd exacerbation. RN sent educational material on what you can do and when to get help.       Assessment:  Patient is in the green zone No falls since last outreach No COPD exacerbations since last outreach  Plan:  Patient will verbalize receiving annual flu shot before next outreach call Patient will verbalize understanding COPD exacerbation RN sent educational material on COPD exacerbation  RN sent educational material on COPD What you can do  RN sent educational material on COPD When to get help RN sent Form-COPD Action Plan RN will follow up within the month of November  Waynette Towers [place] [facility] [phone]

## 2017-11-10 ENCOUNTER — Other Ambulatory Visit: Payer: Self-pay | Admitting: Pharmacist

## 2017-11-10 DIAGNOSIS — J449 Chronic obstructive pulmonary disease, unspecified: Secondary | ICD-10-CM | POA: Diagnosis not present

## 2017-11-10 DIAGNOSIS — R0609 Other forms of dyspnea: Secondary | ICD-10-CM | POA: Diagnosis not present

## 2017-11-10 DIAGNOSIS — R05 Cough: Secondary | ICD-10-CM | POA: Diagnosis not present

## 2017-11-10 NOTE — Patient Outreach (Signed)
[place]) Care Management  11/10/2017  JAKSEN FIORELLA 03-01-1951 [id number]   67 y.o. year old male referred to [place] for Medication Assistance ([place])  Follow up call placed to patient to confirm receipt of [place] and [place] medication assistance applications.   Unfortunately, was unable to reach patient via telephone today and voicemail was full. (unsuccessful outreach #1).  Plan: Will follow up with patient in 3-5 days.   Ruben Reason, PharmD Clinical Pharmacist, [facility] [phone]

## 2017-11-12 DIAGNOSIS — I10 Essential (primary) hypertension: Secondary | ICD-10-CM | POA: Diagnosis not present

## 2017-11-12 DIAGNOSIS — E119 Type 2 diabetes mellitus without complications: Secondary | ICD-10-CM | POA: Diagnosis not present

## 2017-11-12 DIAGNOSIS — G4730 Sleep apnea, unspecified: Secondary | ICD-10-CM | POA: Diagnosis not present

## 2017-11-12 DIAGNOSIS — J438 Other emphysema: Secondary | ICD-10-CM | POA: Diagnosis not present

## 2017-11-12 DIAGNOSIS — I2510 Atherosclerotic heart disease of native coronary artery without angina pectoris: Secondary | ICD-10-CM | POA: Diagnosis not present

## 2017-11-14 ENCOUNTER — Other Ambulatory Visit: Payer: Self-pay | Admitting: Pharmacist

## 2017-11-14 DIAGNOSIS — R0603 Acute respiratory distress: Secondary | ICD-10-CM | POA: Diagnosis not present

## 2017-11-14 DIAGNOSIS — J449 Chronic obstructive pulmonary disease, unspecified: Secondary | ICD-10-CM | POA: Diagnosis not present

## 2017-11-14 NOTE — Patient Outreach (Signed)
[place]) Care Management  11/14/2017  Bradley [name] 09-Oct-1950 [id number]    67 y.o. year old male referred to [place] services for Medication Assistance ([place])  Follow up call placed to patient to confirm receipt of [name] and [place] medication assistance applications.   Patient confirms identity with HIPAA-identifiers x2 and gives verbal consent to speak over the phone about medications.   Medication Assistance:  Bradley [name] confirms that he got the applications. His daughter [name] has them to fill out. He will double check with her this weekend.   Plan: Encouraged Mr. Deckman to have applications completed ASAP. Provided my phone number in case he or [name] (daughter) has any questions. Will follow up as necessary as medication assistance is facilitated by Etter Sjogren, [facility] CPhT.   Bradley [name], PharmD Clinical Pharmacist, [place] [facility] [phone]

## 2017-11-26 DIAGNOSIS — G4733 Obstructive sleep apnea (adult) (pediatric): Secondary | ICD-10-CM | POA: Diagnosis not present

## 2017-11-28 ENCOUNTER — Other Ambulatory Visit: Payer: Self-pay | Admitting: Pharmacist

## 2017-11-28 DIAGNOSIS — G4733 Obstructive sleep apnea (adult) (pediatric): Secondary | ICD-10-CM | POA: Insufficient documentation

## 2017-11-28 NOTE — Patient Outreach (Signed)
[place] [facility]) Care Management  11/28/2017  SIDDHARTHA HOBACK 1950/07/21 [id number]   67 y.o. year old male referred to [place] services for Medication Assistance ([place])  Follow up call placed to patient to confirm receipt of [place] and [place] medication assistance applications.   Patient confirms identity with HIPAA-identifiers x2 and gives verbal consent to speak over the phone about medications.   Medication Assistance:  Mr. Ewan confirms that to the best of his knowledge, his daughter [name] completed his applications and mailed them in. Mr. Fitzsimmons gave verbal permission to call [name].   Called daughter [name]. Unfortunately, she was unavailable. Left HIPAA compliant message to please return my call.   Plan: Will follow up as necessary as medication assistance is facilitated by Etter Sjogren, [facility] CPhT.   Ruben Reason, PharmD Clinical Pharmacist, [place] [facility] [phone]

## 2017-11-30 ENCOUNTER — Ambulatory Visit
Admission: EM | Admit: 2017-11-30 | Discharge: 2017-11-30 | Disposition: A | Discharge status: Home / Self Care | Payer: PPO | Attending: Emergency Medicine | Admitting: Emergency Medicine

## 2017-11-30 ENCOUNTER — Encounter: Payer: Self-pay | Admitting: Gynecology

## 2017-11-30 ENCOUNTER — Other Ambulatory Visit: Payer: Self-pay

## 2017-11-30 DIAGNOSIS — R590 Localized enlarged lymph nodes: Secondary | ICD-10-CM | POA: Diagnosis not present

## 2017-11-30 NOTE — ED Triage Notes (Signed)
Patient c/o lump at left side neck. Pt. Stated had stent placementnt at left side neck x 5-6 years ago.

## 2017-11-30 NOTE — ED Provider Notes (Signed)
HPI  SUBJECTIVE:  Bradley [name] is a 67 y.o. male who presents with atraumatic left neck swelling starting this morning.  States he woke up with it.  States it is painful only with palpation.  Denies fevers, body aches, facial swelling, rash.  No neck stiffness, sore throat.  No dental pain. no strokelike symptoms of facial droop, dysarthria, arm or leg weakness, headache.  He states that the swelling is going down some.  He has never had symptoms like this before.  No aggravating or alleviating factors.  He has not tried anything for this.  Past medical history of COPD, diabetes, hypertension, coronary artery disease, carotid artery stenosis status post left coronary artery stent.  Is on Plavix.  He is a former smoker, quit 2 years ago.  OEV:OJJKKX, Guy Begin, MD   Past Medical History:  Diagnosis Date  . COPD (chronic obstructive pulmonary disease) ([place])   . Coronary artery disease   . Diabetes mellitus without complication ([place])   . Hypertension     Past Surgical History:  Procedure Laterality Date  . LUNG BIOPSY Right    2006  . right side partial lobe removed       Family History  Problem Relation Age of Onset  . Cancer Mother   . Heart attack Father   . Cancer Sister     Social History   Tobacco Use  . Smoking status: Former Smoker    Packs/day: 1.00    Years: 52.00    Pack years: 52.00    Last attempt to quit: 06/18/2016    Years since quitting: 1.4  . Smokeless tobacco: Never Used  Substance Use Topics  . Alcohol use: No  . Drug use: No    No current facility-administered medications for this encounter.   Current Outpatient Medications:  .  albuterol (PROVENTIL) (2.5 MG/3ML) 0.083% nebulizer solution, Take 2.5 mg by nebulization every 6 (six) hours as needed for wheezing or shortness of breath., Disp: , Rfl:  .  aspirin EC 81 MG tablet, Take 81 mg by mouth daily., Disp: , Rfl:  .  clopidogrel (PLAVIX) 75 MG tablet, Take 75 mg by mouth daily., Disp: , Rfl:   .  enalapril (VASOTEC) 10 MG tablet, Take 10 mg by mouth daily., Disp: , Rfl:  .  ferrous sulfate 325 (65 FE) MG EC tablet, Take 1 tablet (325 mg total) by mouth daily., Disp: 30 tablet, Rfl: 3 .  Fluticasone-Salmeterol (ADVAIR) 250-50 MCG/DOSE AEPB, Inhale 1 puff into the lungs 2 (two) times daily., Disp: 60 each, Rfl: 0 .  glucose blood ([employer] TEST STRIPS) test strip, Use once daily. Use as instructed. DX: E11.9, Disp: , Rfl:  .  metFORMIN (GLUCOPHAGE) 500 MG tablet, Take 1 tablet (500 mg total) by mouth 2 (two) times daily with a meal. Takes every morning and then takes another one in the evening if his "sugar starts acting up" (Patient taking differently: Take 500 mg by mouth daily. Takes every morning and then takes another one in the evening if his "sugar starts acting up"), Disp: 60 tablet, Rfl: 0 .  metoprolol (LOPRESSOR) 50 MG tablet, Take 50 mg by mouth 2 (two) times daily., Disp: , Rfl:  .  nitroGLYCERIN (NITROSTAT) 0.4 MG SL tablet, Place 0.4 mg under the tongue every 5 (five) minutes as needed for chest pain., Disp: , Rfl:  .  PROAIR HFA 108 (90 Base) MCG/ACT inhaler, Inhale 2 puffs into the lungs every 6 (six) hours as needed for  wheezing or shortness of breath., Disp: , Rfl:  .  sildenafil (REVATIO) 20 MG tablet, Take 20 mg by mouth daily as needed., Disp: , Rfl:  .  simvastatin (ZOCOR) 20 MG tablet, Take 20 mg by mouth at bedtime. , Disp: , Rfl:  .  tiotropium (SPIRIVA) 18 MCG inhalation capsule, Place 1 capsule (18 mcg total) into inhaler and inhale daily., Disp: 30 capsule, Rfl: 0  No Known Allergies   ROS  As noted in HPI.   Physical Exam  BP 106/71 (BP Location: Left Arm)   Pulse 73   Temp 98.1 F (36.7 C) (Oral)   Resp 16   Ht 5\' 7"  (1.702 m)   Wt 73 kg   SpO2 93%   BMI 25.22 kg/m   Constitutional: Well developed, well nourished, no acute distress Eyes:  EOMI, conjunctiva normal bilaterally HENT: Normocephalic, scalp atraumatic, mucus membranes moist.  No  parotid gland swelling, tenderness.  Nontender, small abrasion left ear without crusting.  External ear otherwise normal.  External ear canal, TM normal.  Poor dentition, nontender, no gingival swelling, lower posterior teeth absent, oropharynx normal.  No swelling underneath the tongue.  No facial swelling. Lymph: No preauricular, postauricular lymphadenopathy.  No other anterior, posterior cervical lymphadenopathy.  No submental lymphadenopathy. Neck:.  + mobile nonpulsatile minimally tender 1 x 2 cm mass at the angle of the left jaw immediately inferior to the left ear.  Skin intact.  No bruit. Respiratory: Normal inspiratory effort Cardiovascular: Normal rate GI: nondistended skin: No facial rash, skin intact Musculoskeletal: no deformities Neurologic: Alert & oriented x 3, no focal neuro deficits cranial nerves III through XII grossly intact Psychiatric: Speech and behavior appropriate   ED Course   Medications - No data to display  No orders of the defined types were placed in this encounter.   No results found for this or any previous visit (from the past 24 hour(s)). No results found.  ED Clinical Impression  Lymphadenopathy of left cervical region   ED Assessment/Plan  This appears to be a lymph node.  Unsure as to the etiology, but there does not appear to be an infection in the vicinity scalp, ear, or in his oropharynx.  Do not think that it is vascular as it is nonpulsatile, has no bruit, patient has no strokelike symptoms, he is grossly neurologically intact.  We will have him observe this for 2 to 4 weeks and refer him to Dr. Richardson Landry, ENT on-call if it has not resolved after 4 weeks  Discussed MDM, treatment plan, and plan for follow-up with patient. Discussed sn/sx that should prompt return to the ED. patient agrees with plan.   No orders of the defined types were placed in this encounter.   *This clinic note was created using Dragon dictation software. Therefore,  there may be occasional mistakes despite careful proofreading.   ?   Melynda Ripple, MD 11/30/17 1744

## 2017-11-30 NOTE — Discharge Instructions (Addendum)
Follow-up with ENT in 4 weeks if this is not resolved.  Go immediately to the ER for any strokelike symptoms, neck stiffness, or for other concerns.

## 2017-12-01 ENCOUNTER — Other Ambulatory Visit: Payer: Self-pay | Admitting: Family Medicine

## 2017-12-01 DIAGNOSIS — K1370 Unspecified lesions of oral mucosa: Secondary | ICD-10-CM | POA: Diagnosis not present

## 2017-12-01 DIAGNOSIS — R221 Localized swelling, mass and lump, neck: Secondary | ICD-10-CM

## 2017-12-03 ENCOUNTER — Ambulatory Visit
Admission: RE | Admit: 2017-12-03 | Discharge: 2017-12-03 | Disposition: A | Discharge status: Home / Self Care | Payer: PPO | Source: Ambulatory Visit | Attending: Family Medicine | Admitting: Family Medicine

## 2017-12-03 DIAGNOSIS — K118 Other diseases of salivary glands: Secondary | ICD-10-CM | POA: Diagnosis not present

## 2017-12-03 DIAGNOSIS — R221 Localized swelling, mass and lump, neck: Secondary | ICD-10-CM | POA: Diagnosis not present

## 2017-12-07 DIAGNOSIS — E538 Deficiency of other specified B group vitamins: Secondary | ICD-10-CM | POA: Insufficient documentation

## 2017-12-08 DIAGNOSIS — E119 Type 2 diabetes mellitus without complications: Secondary | ICD-10-CM | POA: Diagnosis not present

## 2017-12-08 DIAGNOSIS — R3129 Other microscopic hematuria: Secondary | ICD-10-CM | POA: Diagnosis not present

## 2017-12-08 DIAGNOSIS — I10 Essential (primary) hypertension: Secondary | ICD-10-CM | POA: Diagnosis not present

## 2017-12-10 ENCOUNTER — Ambulatory Visit: Payer: Self-pay | Admitting: Pharmacy Technician

## 2017-12-10 ENCOUNTER — Other Ambulatory Visit: Payer: Self-pay | Admitting: Pharmacy Technician

## 2017-12-10 NOTE — Patient Outreach (Addendum)
[place] [facility]) Care Management  12/10/2017  Bradley [name] 08/08/1950 [id number]   Unsuccessful call to patient daughter, Bradley [name], HIPAA compliant voicemail left.   Incoming call from patients daughter Bradley [name], [place] identifiers verified.  Bradley [name] states that her father hadn't provided her with patient assistance applications for her to fill out and has requested that I email her the applications and she would either email them back to me or fax them back in. Informed her that we would need proof of income.  Scanned applications in and emailed them to [email or web address]  Will follow up with Bradley [name] in 3- 5 business days if I have not received items back.  Bradley [name] also mentioned trying to get her father help with having teeth removed, informed her that I would outreach a Education officer, museum on our Care Team.  Sent in basket message to [place].  Maud Deed [place], [place] Management [phone]

## 2017-12-11 ENCOUNTER — Other Ambulatory Visit: Payer: Self-pay | Admitting: Pharmacy Technician

## 2017-12-11 NOTE — Patient Outreach (Signed)
[place] [facility]) Care Management  12/11/2017  JUSTIS CLOSSER [birth year] [id number]   Received patient portions of applications for Spiriva, Advair and Ventolin. Faxed completed applications and required documents into companies.  Will follow up with [employer] in 7-10 business days and [employer] in 3-5 business days.  Maud Deed [place], [place] Management [phone]

## 2017-12-14 DIAGNOSIS — J449 Chronic obstructive pulmonary disease, unspecified: Secondary | ICD-10-CM | POA: Diagnosis not present

## 2017-12-14 DIAGNOSIS — R0603 Acute respiratory distress: Secondary | ICD-10-CM | POA: Diagnosis not present

## 2017-12-15 ENCOUNTER — Other Ambulatory Visit: Payer: Self-pay

## 2017-12-15 ENCOUNTER — Other Ambulatory Visit: Payer: Self-pay | Admitting: Otolaryngology

## 2017-12-15 DIAGNOSIS — R221 Localized swelling, mass and lump, neck: Secondary | ICD-10-CM | POA: Diagnosis not present

## 2017-12-15 DIAGNOSIS — J449 Chronic obstructive pulmonary disease, unspecified: Secondary | ICD-10-CM | POA: Diagnosis not present

## 2017-12-15 DIAGNOSIS — D1030 Benign neoplasm of unspecified part of mouth: Secondary | ICD-10-CM | POA: Diagnosis not present

## 2017-12-15 NOTE — Patient Outreach (Signed)
[place] [facility]) Care Management  12/15/2017  Bradley [name] 08/22/1950 [id number]  BSW received patient referral from [place] team member stating the patient was in need of low cost dental resources. Successful outreach to the patient on today's date. The patient requests this BSW speak with his daughter, Bradley [name]. BSW placed a call to Sabino Dick who was able to verify patients HIPAA identifiers.   Bradley [name] states her father is in need of dental resources to assist with pulling his teeth and possibly dentures. The patient has several broken and missing teeth which effect his ability to eat. The patient does not have any money in savings nor does he have dental coverage. Lori requests information on dental resources which may allow the patient to set-up a payment plan. The patient is just over the income limit for Medicaid and has never applied for coverage.  BSW spoke with Bradley [name] about the possibility of the patient qualifying for Medicaid coverage based on medical bills and assets. BSW educated Lori on [place] and discussed different types of Medicaid coverage. Bradley [name] is agreeable to assist patient in completing a Medicaid application.   BSW spoke with Bradley [name] about different types of low cost dental services such as dental schools, dental clinics, and [facility]. Bradley [name] is unsure if her father is willing to visit a clinic or school due to wait times but understands this may be his best option. Lori plans to discuss with her father to establish which resource will meet his needs. BSW to mail a list of dental resources to [place] home for her review.  Plan: BSW to contact Bradley [name] in the next two weeks to confirm receipt of mailing.  Daneen Schick, BSW, CDP [facility] [phone]

## 2017-12-22 ENCOUNTER — Ambulatory Visit
Admission: RE | Admit: 2017-12-22 | Discharge: 2017-12-22 | Disposition: A | Discharge status: Home / Self Care | Payer: PPO | Source: Ambulatory Visit | Attending: Otolaryngology | Admitting: Otolaryngology

## 2017-12-22 DIAGNOSIS — K029 Dental caries, unspecified: Secondary | ICD-10-CM | POA: Diagnosis not present

## 2017-12-22 DIAGNOSIS — I700 Atherosclerosis of aorta: Secondary | ICD-10-CM | POA: Insufficient documentation

## 2017-12-22 DIAGNOSIS — R221 Localized swelling, mass and lump, neck: Secondary | ICD-10-CM | POA: Diagnosis present

## 2017-12-22 DIAGNOSIS — J387 Other diseases of larynx: Secondary | ICD-10-CM | POA: Insufficient documentation

## 2017-12-22 DIAGNOSIS — R220 Localized swelling, mass and lump, head: Secondary | ICD-10-CM | POA: Diagnosis not present

## 2017-12-22 DIAGNOSIS — J439 Emphysema, unspecified: Secondary | ICD-10-CM | POA: Insufficient documentation

## 2017-12-22 DIAGNOSIS — I6523 Occlusion and stenosis of bilateral carotid arteries: Secondary | ICD-10-CM | POA: Diagnosis not present

## 2017-12-22 MED ORDER — IOPAMIDOL (ISOVUE-300) INJECTION 61%
75.0000 mL | Freq: Once | INTRAVENOUS | Status: AC | PRN
  Administered 2017-12-22: 75 mL via INTRAVENOUS

## 2017-12-24 ENCOUNTER — Other Ambulatory Visit: Payer: Self-pay | Admitting: Pharmacy Technician

## 2017-12-24 DIAGNOSIS — J441 Chronic obstructive pulmonary disease with (acute) exacerbation: Secondary | ICD-10-CM | POA: Diagnosis not present

## 2017-12-24 NOTE — Patient Outreach (Signed)
[place] [facility]) Care Management  12/24/2017  WHITFIELD DULAY Mar 16, 1950 [id number]  Successful outgoing call placed to [place] patient assistance program.  Called and spoke to Elle to check on status of the patient's patient assistance application. Patient has been approved for Spiriva as of 12/18/2017.  The medication was shipped on 12/23/2017 and patient should received in 7-10 business days.   Will route note to [place] to followup in 10-14 business days to confirm med has been received.  Kentrel Clevenger P. Carmelia Tiner, [place] Management [phone]

## 2017-12-25 ENCOUNTER — Ambulatory Visit: Payer: Self-pay | Admitting: Pharmacy Technician

## 2017-12-25 ENCOUNTER — Other Ambulatory Visit: Payer: Self-pay | Admitting: Pharmacy Technician

## 2017-12-25 NOTE — Patient Outreach (Addendum)
[place] [facility]) Care Management  12/25/2017  Bradley [name] 12-26-1950 [id number]   Unsuccessful call to patients daughter, Bradley [name] not available. I sent email to Bradley [name] requesting that she call me back so that I could give her an update on applications.  Will make follow up call attempt in 2-3 business days if I have not received a call from her.  Maud Deed [place], [place] Management [phone]    Addendum  Incoming call from patients daughter, Bradley [name], HIPAA identifiers verified. Informed Bradley [name] of patient being approved for Spiriva until 03/04/2019, she confirmed she had received a letter in the mail from [facility] as well.  She states she had to take her father in to the Dr.  because he was wheezing so bad and the provider stated he needed to be on "all' his inhalers and that her father informed him that he could not afford them. I told her that with [place] he would need to spend about $84 more dollars in order for him to be approved for his Ventolin and Advair. After reviewing his HTA OOP spend report, I noticed patient had not filled his Advair at all in 2019. Bradley [name] states that because he is unable to afford it, when he runs out he goes to the emergency room so that hopefully he can receive more samples. I informed her that I would discuss with a pharmacist if there are possibly other drugs that he may be switched too and that we can apply for that have assistance programs.  Informed her that I would call her back once Donnald Garre been able to discuss it. She stated that would be fine and in the meantime would try to find options to try to pay for a fill for his drugs to reach the [place] $600 OOP spend requirement.

## 2017-12-26 ENCOUNTER — Other Ambulatory Visit: Payer: Self-pay | Admitting: Pharmacy Technician

## 2017-12-26 NOTE — Patient Outreach (Signed)
[facility]) Care Management  12/26/2017  LANDIS DOWDY [birth year] [id number]   Follow up call to [name], patients daughter, HIPAA identifiers verified. Informed her that I spoke to another one of our [facility]) pharmacists about drug options that have patient assistance to apply for, for 2020. Informed her that I would have Hilda outreach her to counsel her on inhalers (maintenance vs rescue) so that she can have a better understanding and hopefully her father will have a better understanding. Also informed her that Almyra Free would be able to outreach his provider about possibly changing his Advair inhaler.  Will route note to [place].  Maud Deed [place] Cord Certified Pharmacy Technician [place] [facility] Care Management [phone]

## 2017-12-29 ENCOUNTER — Other Ambulatory Visit: Payer: Self-pay | Admitting: Pharmacist

## 2017-12-29 ENCOUNTER — Other Ambulatory Visit: Payer: Self-pay

## 2017-12-29 ENCOUNTER — Ambulatory Visit: Payer: Self-pay

## 2017-12-29 NOTE — Patient Outreach (Signed)
[facility]) Care Management  [place]   12/30/2017  KIANTE CIAVARELLA 06-08-1950 [id number]  Reason for referral: medication assistance follow up  Patient assistance medications: Spiriva, Advair, albulterol Current insurance: HTA  HPI: Spoke with patient's daughter [name]) about the importance of using maintenance inhalers (Spiriva/Advair) to control COPD. Patient will have to use less rescue albuterol if he will stay compliant with maintenance inhalers. Patient is $84 away from getting [place] PAPs approved.  If patient will pick up Advair from the pharmacy, he will most likely meet this goal.  PAPs for [employer] 2020 [facility]) will be in the works at a later date this year as facilitated by [place], Etter Sjogren.  Please send all future PAP communications to daughter [name]): Mr. Andrw Mcguirt [place] [phone]  Instructed patient and daughter that future PAPs will be coming in an HTA envelope.     PLAN: -I will keep patient on active patient list until PAPs approved this year   Regina Eck, PharmD, [place]  [phone]

## 2017-12-29 NOTE — Patient Outreach (Signed)
[place] [facility]) Care Management  12/29/2017  Bradley [name] 07-21-1950 [id number]  Unsuccessful outreach to the patients daughter in efforts to confirm receipt of mailed resources. BSW left a HIPAA complaint voice message requesting a return call.  Plan: BSW to attempt another outreach within the next four business days pending no return call received.  Daneen Schick, BSW, CDP [facility] [phone]

## 2017-12-30 ENCOUNTER — Other Ambulatory Visit: Payer: Self-pay | Admitting: Pharmacy Technician

## 2017-12-30 ENCOUNTER — Ambulatory Visit: Payer: Self-pay | Admitting: Pharmacy Technician

## 2017-12-30 NOTE — Patient Outreach (Signed)
See 10/31 note

## 2018-01-01 NOTE — Patient Outreach (Signed)
[place] [facility]) Care Management  01/01/2018  Bradley [name] 1950-08-04 [id number]   Received [employer] patient assistance referral from [facility] for Proventil HFA and [facility]. Prepared patient portion to be mailed and prepared provider portion to be mailed out to Dr. Raul Del.  Will follow up with patient in 5-7 business days to confirm application has been received.  Maud Deed Beola Cord Certified Pharmacy Technician [place] [facility] Care Management [phone]

## 2018-01-02 ENCOUNTER — Other Ambulatory Visit: Payer: Self-pay

## 2018-01-02 ENCOUNTER — Ambulatory Visit: Payer: Self-pay

## 2018-01-02 NOTE — Patient Outreach (Signed)
[place] [facility]) Care Management  01/02/2018  Bradley [name] 08-08-1950 [id number]  Second unsuccessful outreach attempt to the patients daughter, [name], to confirm receipt of mailed resources. BSW left a HIPAA compliant voice message requesting a return call.  Plan: BSW will attempt a third and final outreach within the next four business days.  Daneen Schick, BSW, CDP [facility] [phone]

## 2018-01-05 DIAGNOSIS — I10 Essential (primary) hypertension: Secondary | ICD-10-CM | POA: Diagnosis not present

## 2018-01-05 DIAGNOSIS — E7800 Pure hypercholesterolemia, unspecified: Secondary | ICD-10-CM | POA: Diagnosis not present

## 2018-01-05 DIAGNOSIS — J439 Emphysema, unspecified: Secondary | ICD-10-CM | POA: Diagnosis not present

## 2018-01-05 DIAGNOSIS — R0602 Shortness of breath: Secondary | ICD-10-CM | POA: Diagnosis not present

## 2018-01-05 DIAGNOSIS — E782 Mixed hyperlipidemia: Secondary | ICD-10-CM | POA: Diagnosis not present

## 2018-01-05 DIAGNOSIS — I6522 Occlusion and stenosis of left carotid artery: Secondary | ICD-10-CM | POA: Diagnosis not present

## 2018-01-05 DIAGNOSIS — F17200 Nicotine dependence, unspecified, uncomplicated: Secondary | ICD-10-CM | POA: Diagnosis not present

## 2018-01-05 DIAGNOSIS — E119 Type 2 diabetes mellitus without complications: Secondary | ICD-10-CM | POA: Diagnosis not present

## 2018-01-05 DIAGNOSIS — G4730 Sleep apnea, unspecified: Secondary | ICD-10-CM | POA: Diagnosis not present

## 2018-01-06 ENCOUNTER — Ambulatory Visit: Payer: Self-pay | Admitting: Pharmacist

## 2018-01-06 DIAGNOSIS — D1030 Benign neoplasm of unspecified part of mouth: Secondary | ICD-10-CM | POA: Diagnosis not present

## 2018-01-06 DIAGNOSIS — R221 Localized swelling, mass and lump, neck: Secondary | ICD-10-CM | POA: Diagnosis not present

## 2018-01-06 DIAGNOSIS — J449 Chronic obstructive pulmonary disease, unspecified: Secondary | ICD-10-CM | POA: Diagnosis not present

## 2018-01-07 ENCOUNTER — Other Ambulatory Visit: Payer: Self-pay | Admitting: Hematology and Oncology

## 2018-01-07 ENCOUNTER — Inpatient Hospital Stay: Payer: PPO | Admitting: Hematology and Oncology

## 2018-01-07 ENCOUNTER — Inpatient Hospital Stay: Payer: PPO

## 2018-01-07 DIAGNOSIS — J439 Emphysema, unspecified: Secondary | ICD-10-CM | POA: Diagnosis not present

## 2018-01-07 DIAGNOSIS — E538 Deficiency of other specified B group vitamins: Secondary | ICD-10-CM

## 2018-01-07 NOTE — Progress Notes (Deleted)
[facility] day:  01/07/2018  Chief Complaint: Bradley [name] is a 67 y.o. male with iron deficiency anemia on oral iron who is seen for 6 month assessment.   HPI:  The patient was last seen in the hematology clinic on 10/08/2017.  At that time, he was doing well overall. Previously reported hip pain had resolved following steroid injections. Patient denied any B symptoms. He denied any interval infections. Exam was unremarkable.   Ferritin was low at 31. He was not consistently taking oral iron as prescribed. Patient encouraged to take oral iron supplement with a source of vitamin C daily.  He was encouraged to take his oral B12.  He was to be seen by urology for a history of hemoglobinuria.  He was seen in the [facility] on 11/30/2017 with atraumatic neck swelling.  Exam revealed a mobile 1 x 2 cm mass at the angle of the left jaw immediately inferior to the left ear.  He was scheduled to follow-up with Dr. Richardson Landry, ENT.  Soft tissue neck ultrasound on 12/03/2017 revealed a typical appearing lymph node in the are of concern.  Soft tissue neck CT on 12/22/2017 revealed the area of clinical concern corresponded to the posterior left parotid gland.  There was no parotid or other regional mass or abnormality identified.  There were no acute findings in the neck. There was a small, partially calcified retention cysts in the left vallecula, stable   During the interim,   Past Medical History:  Diagnosis Date  . COPD (chronic obstructive pulmonary disease) ([place])   . Coronary artery disease   . Diabetes mellitus without complication ([place])   . Hypertension     Past Surgical History:  Procedure Laterality Date  . LUNG BIOPSY Right    2006  . right side partial lobe removed       Family History  Problem Relation Age of Onset  . Cancer Mother   . Heart attack Father   . Cancer Sister     Social History:  reports that he quit smoking about 18 months  ago. He has a 52.00 pack-year smoking history. He has never used smokeless tobacco. He reports that he does not drink alcohol or use drugs.  He smoked 1 pack/day since age 25 until 9 weeks ago.  He quit smoking on 06/18/2016 at 10 AM.  He denies any exposure to radiation or toxins.  He is retired from working for the [employer].  His wife died 36 months ago with "cancer in the blood" (? leukemia/lymphoma).  The patient is alone today.  Allergies: No Known Allergies  Current Medications: Current Outpatient Medications  Medication Sig Dispense Refill  . albuterol (PROVENTIL) (2.5 MG/3ML) 0.083% nebulizer solution Take 2.5 mg by nebulization every 6 (six) hours as needed for wheezing or shortness of breath.    Marland Kitchen aspirin EC 81 MG tablet Take 81 mg by mouth daily.    . clopidogrel (PLAVIX) 75 MG tablet Take 75 mg by mouth daily.    . enalapril (VASOTEC) 10 MG tablet Take 10 mg by mouth daily.    . ferrous sulfate 325 (65 FE) MG EC tablet Take 1 tablet (325 mg total) by mouth daily. 30 tablet 3  . Fluticasone-Salmeterol (ADVAIR) 250-50 MCG/DOSE AEPB Inhale 1 puff into the lungs 2 (two) times daily. 60 each 0  . glucose blood ([employer] TEST STRIPS) test strip Use once daily. Use as instructed. DX: E11.9    .  metFORMIN (GLUCOPHAGE) 500 MG tablet Take 1 tablet (500 mg total) by mouth 2 (two) times daily with a meal. Takes every morning and then takes another one in the evening if his "sugar starts acting up" (Patient taking differently: Take 500 mg by mouth daily. Takes every morning and then takes another one in the evening if his "sugar starts acting up") 60 tablet 0  . metoprolol (LOPRESSOR) 50 MG tablet Take 50 mg by mouth 2 (two) times daily.    . nitroGLYCERIN (NITROSTAT) 0.4 MG SL tablet Place 0.4 mg under the tongue every 5 (five) minutes as needed for chest pain.    Marland Kitchen PROAIR HFA 108 (90 Base) MCG/ACT inhaler Inhale 2 puffs into the lungs every 6 (six) hours as needed for wheezing or shortness of  breath.    . sildenafil (REVATIO) 20 MG tablet Take 20 mg by mouth daily as needed.    . simvastatin (ZOCOR) 20 MG tablet Take 20 mg by mouth at bedtime.     Marland Kitchen tiotropium (SPIRIVA) 18 MCG inhalation capsule Place 1 capsule (18 mcg total) into inhaler and inhale daily. 30 capsule 0   No current facility-administered medications for this visit.     Review of Systems  Constitutional: Negative for diaphoresis, fever, malaise/fatigue and weight loss (weight up 2 pounds).       "I'm doing real good today"  HENT: Negative.   Eyes: Negative.   Respiratory: Positive for cough (COPD ) and shortness of breath (exertional). Negative for hemoptysis and sputum production.   Cardiovascular: Negative for chest pain, palpitations, orthopnea, leg swelling and PND.  Gastrointestinal: Negative for abdominal pain, blood in stool, constipation, diarrhea, melena, nausea and vomiting.  Genitourinary: Negative for dysuria, frequency, hematuria and urgency.  Musculoskeletal: Negative for back pain, falls and myalgias.       Hip pain improved following steroid injections.   Skin: Negative for itching and rash.  Neurological: Negative for dizziness, tremors, weakness and headaches.  Endo/Heme/Allergies: Does not bruise/bleed easily.  Psychiatric/Behavioral: Negative for depression, memory loss and suicidal ideas. The patient is not nervous/anxious and does not have insomnia.   All other systems reviewed and are negative.  Performance status (ECOG): 0 - Asymptomatic  Vital Signs There were no vitals taken for this visit.  Physical Exam  Constitutional: He is oriented to person, place, and time and well-developed, well-nourished, and in no distress.  HENT:  Head: Normocephalic and atraumatic.  Gray hair.  Male pattern baldness.  Eyes: Pupils are equal, round, and reactive to light. EOM are normal. No scleral icterus.  Glasses (new bifocals). Blue eyes.   Neck: Normal range of motion. Neck supple. No tracheal  deviation present. No thyromegaly present.  Cardiovascular: Normal rate, regular rhythm and normal heart sounds. Exam reveals no gallop and no friction rub.  No murmur heard. Pulmonary/Chest: Effort normal and breath sounds normal. No respiratory distress. He has no wheezes. He has no rales.  Abdominal: Soft. Bowel sounds are normal. He exhibits no distension. There is no tenderness.  Musculoskeletal: Normal range of motion. He exhibits no edema or tenderness.  Neurological: He is alert and oriented to person, place, and time.  Skin: Skin is warm and dry. No rash noted. No erythema.  Psychiatric: Mood, affect and judgment normal.  Nursing note and vitals reviewed.   No visits with results within 3 Day(s) from this visit.  Latest known visit with results is:  Appointment on 10/08/2017  Component Date Value Ref Range Status  . Ferritin  10/08/2017 31  24 - 336 ng/mL Final   Performed at [facility], [address] [place], [place] [address]  . WBC 10/08/2017 8.6  3.8 - 10.6 K/uL Final  . RBC 10/08/2017 5.16  4.40 - 5.90 MIL/uL Final  . Hemoglobin 10/08/2017 15.0  13.0 - 18.0 g/dL Final  . HCT 10/08/2017 45.2  40.0 - 52.0 % Final  . MCV 10/08/2017 87.8  80.0 - 100.0 fL Final  . MCH 10/08/2017 29.0  26.0 - 34.0 pg Final  . MCHC 10/08/2017 33.1  32.0 - 36.0 g/dL Final  . RDW 10/08/2017 15.1* 11.5 - 14.5 % Final  . Platelets 10/08/2017 291  150 - 440 K/uL Final  . Neutrophils Relative % 10/08/2017 70  % Final  . Neutro Abs 10/08/2017 6.0  1.4 - 6.5 K/uL Final  . Lymphocytes Relative 10/08/2017 17  % Final  . Lymphs Abs 10/08/2017 1.5  1.0 - 3.6 K/uL Final  . Monocytes Relative 10/08/2017 10  % Final  . Monocytes Absolute 10/08/2017 0.9  0.2 - 1.0 K/uL Final  . Eosinophils Relative 10/08/2017 2  % Final  . Eosinophils Absolute 10/08/2017 0.1  0 - 0.7 K/uL Final  . Basophils Relative 10/08/2017 1  % Final  . Basophils Absolute 10/08/2017 0.1  0 - 0.1 K/uL Final   Performed at  [facility], [address], [place], [place] [address]    Assessment:  OAKLYN MANS is a 67 y.o. male with mild anemia with progressive microcytic indices over the past 2 years.  Diet is good.  He denies any melena, hematochezia or hematuria.  Colonoscopy on 07/01/2012 revealed diverticulosis.  He believes he had an EGD 10 years ago.  Work-up on 08/07/2016 revealed a hematocrit of 41.5, hemoglobin 13.7, MCV 81.9, platelets 337,000, white count 9300 with an [place] of 5800.  Normal studies included:  SPEP, folate, iron saturation (24%), TIBC (399), sed rate, retic (1.3%), TSH.  He has mild hypogammaglobulinemia with an IgG of 601 (700 - 1600).  IgG was 562 on 08/03/2014.  Ferritin was 16 (low).  B12 was 319 (low normal) with a normal MMA r/o B12 deficiency.  Free light chains were normal. 24 hour UPEP revealed 112 mg/24 hours (30-150).  Immunofixation was normal.  Urinalysis revealed trace hemoglobin.  One month prior urine hemoglobin was small.  Guaiac cards were negative x 3. Patient currently on Ferrous Sulfate supplementation; not taking with Vitamin C. Discussed dietary recommendations.   Ferritin has been followed: 16 on 08/07/2016, 30 on 11/19/2016, 37 on 03/19/2017, 23 on 06/18/2017, and 31 on 10/08/2017.  He has been hospitalized 2 times/year in the past 3 years for pneumonia. He has COPD.  He denies any other infections. IgG was 711 (normal) on 03/19/2017.  PET scan at [facility] on 07/12/2016 revealed postsurgical changes status post partial lower lobe resection.  There was heterogeneous opacity at the resection site favoring to represent atelectasis with  possible superimposed inflammatory/infectious process. There were other small nodules within the right lower lobe demonstrating minimal activity and stable.  Low dose chest CT on 09/03/2016 demonstrated small mediastinal lymph nodes that did not meet pathologic CT size criteria. There were scattered bilateral pulmonary nodules measuring  up to 5.3 mm noted in the RIGHT middle lobe. There was a 6.3 mm calcified granuloma in the posterior LEFT upper lobe.   Low dose chest CT on 09/12/2017 revealed Lung-RADS 2, benign appearance or behavior. There was scattered bilateral pulmonary nodules (up to 5.2  mm).  There was a stable 1.1 cm nodular opacity in the lateral RLL (favored RML).  Continue annual screening with low-dose chest CT without contrast in 12 months was recommended.  Symptomatically,  patient is doing well overall. Previously reported hip pain has resolved following steroid injections. Patient denies that he has experienced any B symptoms. He denies any interval infections. Exam is unremarkable.   Plan: 1. Labs today:  CBC with diff, ferritin, B12, folate.   2.   IDA - stable  Hemoglobin 15.0, hematocrit 45.2, MCV 87.8  Discuss iron stores. Ferritin low at 31.   Patient is not consistently taking oral iron as prescribed. Patient encouraged to take oral iron supplement with a source of vitamin C daily. 2. Hemoglobinuria - ongoing  Recent UA in June continued to be (+) for hemoglobin. He was referred to nephrology, however never attended scheduled consult. Discussed further today and patient is amenable to consult at this time. Will reschedule.  3. B12 deficiency - ongoing  B12 level previous low, with a normal MMA.   Patient not taking B12 as directed. Encouraged to restart B12 1,000 mcg supplement daily.   Will plan on recheck B12 level with next lab draw. 4. Discuss interval low dose chest CT.  Continue annual follow-up. 5. RTC in 3 months for MD assessment and  labs (CBC with diff, ferritin, B12).   Lequita Asal, MD  01/07/2018, 4:44 AM   I saw and evaluated the patient, participating in the key portions of the service and reviewing pertinent diagnostic studies and records.  I reviewed the nurse practitioner's note and agree with the findings and the plan.  The assessment and plan were discussed with  the patient.  A few questions were asked by the patient and answered.   Nolon Stalls, MD 01/07/2018,4:44 AM

## 2018-01-08 ENCOUNTER — Ambulatory Visit: Payer: Self-pay

## 2018-01-08 ENCOUNTER — Other Ambulatory Visit: Payer: Self-pay

## 2018-01-08 IMAGING — CT CT CHEST LUNG CANCER SCREENING LOW DOSE W/O CM
1 of 2 series · 10 of 20 positions shown, 13 images · non-contrast
Comparison: CT chest dated 10/08/2014

CLINICAL DATA: 65-year-old female former smoker, quit 3 months ago,
with 52 pack-year history of smoking, for initial lung cancer
screening. Prior right partial lobectomy for cyst.

EXAM:
CT CHEST WITHOUT CONTRAST LOW-DOSE FOR LUNG CANCER SCREENING
TECHNIQUE: Multidetector CT imaging of the chest was performed following the
standard protocol without IV contrast.

[ct lung segmentation data · axial · 0.72mm/px · z∈[-722,-722]mm · 10 of 345 frames shown]
[frame 1/345  mediastinal]
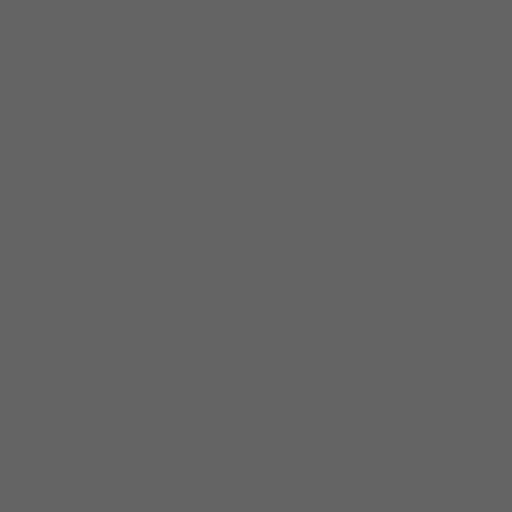
[frame 1/345  lung]
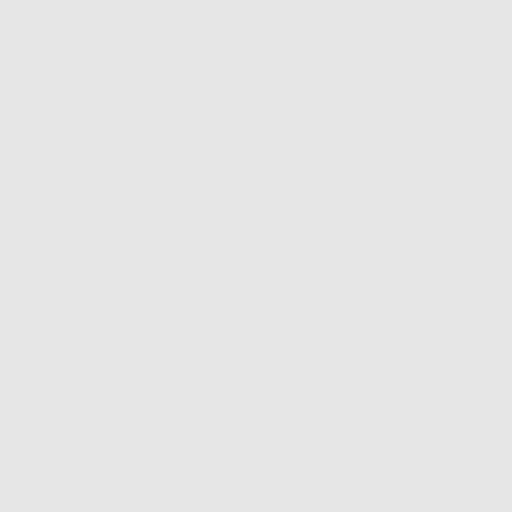
[frame 39/345  lung]
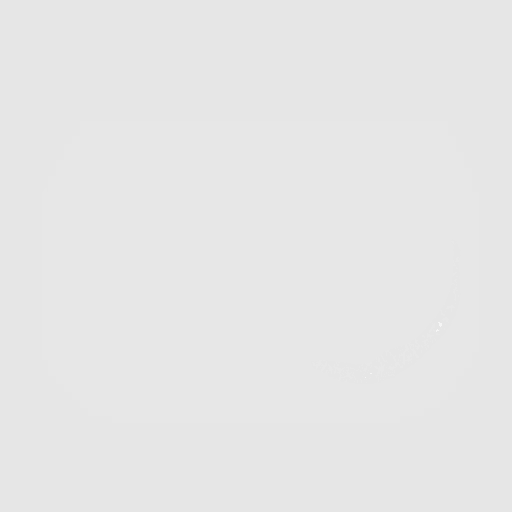
[frame 77/345  lung]
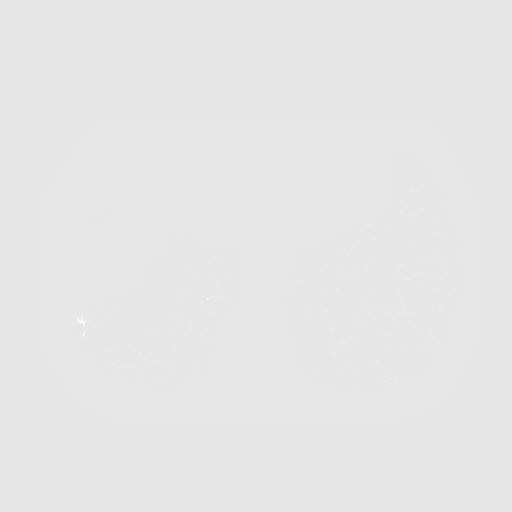
[frame 115/345  lung]
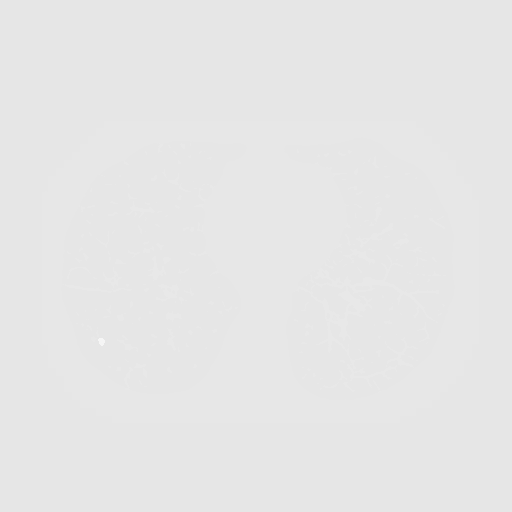
[frame 153/345  mediastinal]
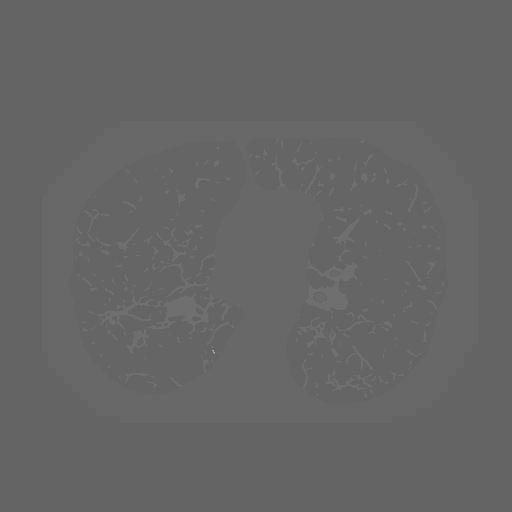
[frame 153/345  lung]
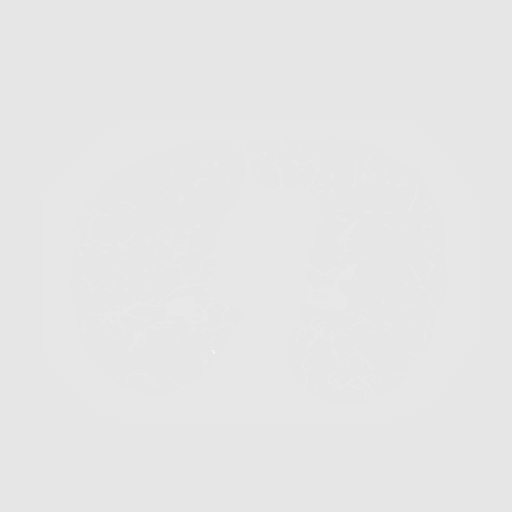
[frame 192/345  lung]
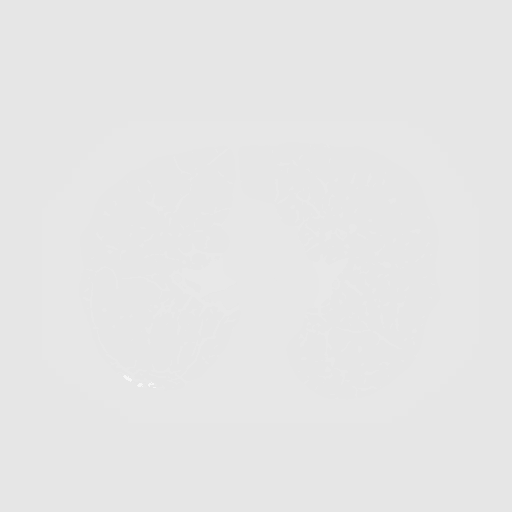
[frame 230/345  lung]
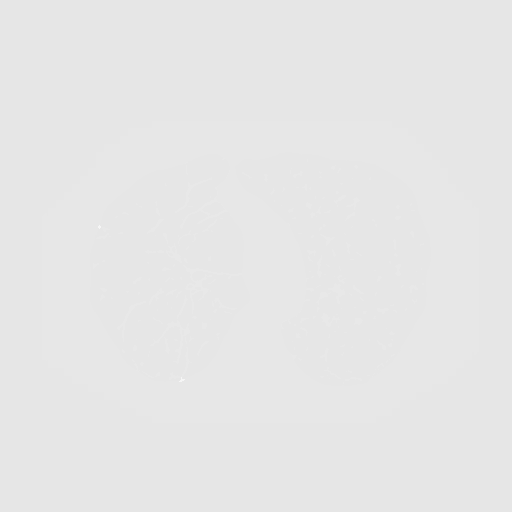
[frame 268/345  lung]
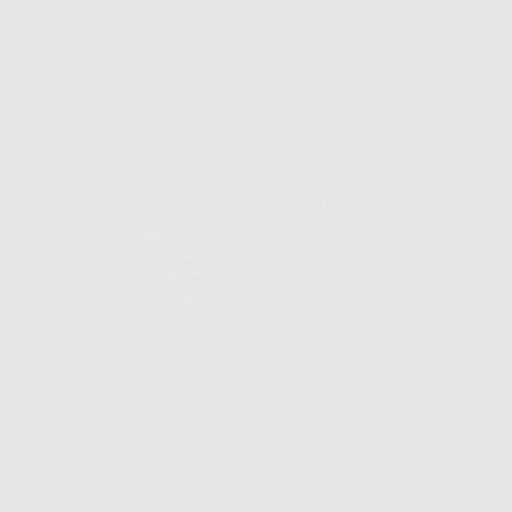
[frame 306/345  mediastinal]
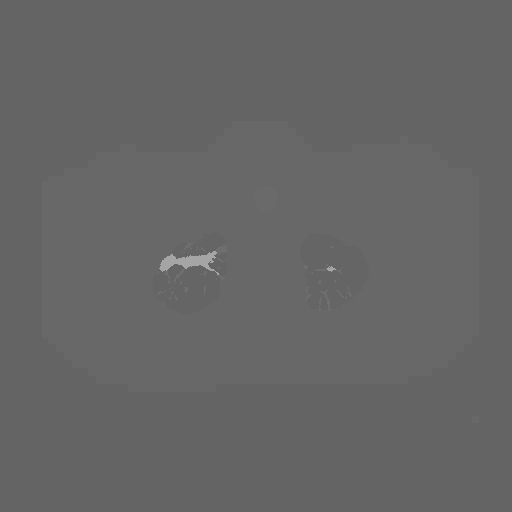
[frame 306/345  lung]
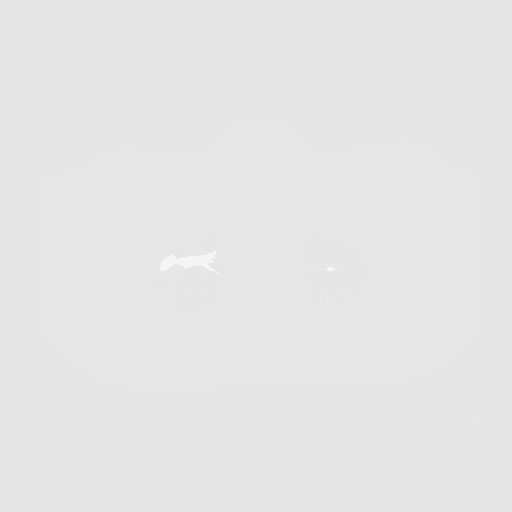
[frame 345/345  lung]
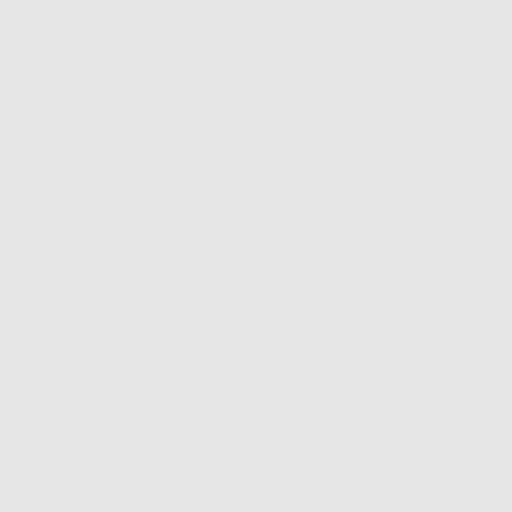

[10 of 20 positions shown; findings below may reference images not displayed]

FINDINGS: Cardiovascular: The heart is normal in size. No pericardial
effusion.

Left main and 3 vessel coronary atherosclerosis.

Atherosclerotic calcifications of the aortic arch. No evidence of
thoracic aortic aneurysm.

Mediastinum/Nodes: Small mediastinal lymph nodes which do not meet
pathologic CT size criteria.

Visualized thyroid is unremarkable.

Lungs/Pleura: Biapical pleural-parenchymal scarring, right greater
than left.

Moderate centrilobular and paraseptal emphysematous changes, upper
lung predominant.

Status post (partial) right lower lobectomy. Prior multifocal
infection in the right lower lung has resolved.

Scarring in the lateral right lung base. Additional irregular
nodular opacity in the posterior right lower lung (series 3/ image
180), favored to reflect the right middle lobe. This appearance
favors nodular scarring related to prior infection.

Additional scattered bilateral pulmonary nodules measuring up to
mm in the right middle lobe. 6.3 mm calcified granuloma in the
posterior left upper lobe.

No pleural effusion or pneumothorax.

Upper Abdomen: Visualized upper abdomen is grossly unremarkable.

Musculoskeletal: Visualized osseous structures are within normal
limits.
IMPRESSION: Lung-RADS 2S, benign appearance or behavior. Continue annual
screening with low-dose chest CT without contrast in 12 months.

A Lung-RADS "S" modifier is used due to the presence of a
potentially clinically significant finding, in this case, left main
and three-vessel coronary atherosclerosis.

Aortic Atherosclerosis (OAOZR-M73.3) and Emphysema (OAOZR-PN1.2).

## 2018-01-08 NOTE — Patient Outreach (Signed)
[facility]) Care Management  01/08/2018  Bradley [name] 11/30/1950 [id number]  Third unsuccessful outreach to the patients daughter in efforts to confirm receipt of mailed resources. BSW left a HIPAA compliant voice message requesting a return call.  Plan: BSW will perform a discipline closure on November 11 pending no return call due to an inability to maintain contact.  Daneen Schick, BSW, CDP [facility] [phone]

## 2018-01-09 ENCOUNTER — Other Ambulatory Visit: Payer: Self-pay | Admitting: *Deleted

## 2018-01-12 ENCOUNTER — Other Ambulatory Visit: Payer: Self-pay

## 2018-01-12 NOTE — Patient Outreach (Signed)
[facility]) Care Management  01/12/2018  Bradley [name] 1950-03-28 [id number]  BSW to perform a discipline closure on today's date due to an inability to maintain contact. BSW to update the patients Bradley [name], Bradley [name] of closure.  Daneen Schick, BSW, CDP [facility] [phone]

## 2018-01-13 ENCOUNTER — Ambulatory Visit: Payer: Self-pay | Admitting: Pharmacist

## 2018-01-14 ENCOUNTER — Other Ambulatory Visit: Payer: Self-pay

## 2018-01-14 ENCOUNTER — Inpatient Hospital Stay (HOSPITAL_BASED_OUTPATIENT_CLINIC_OR_DEPARTMENT_OTHER): Payer: PPO | Admitting: Hematology and Oncology

## 2018-01-14 ENCOUNTER — Inpatient Hospital Stay: Payer: PPO | Attending: Hematology and Oncology

## 2018-01-14 VITALS — BP 130/81 | HR 76 | Temp 95.5°F | Resp 18 | Wt 172.3 lb

## 2018-01-14 DIAGNOSIS — D509 Iron deficiency anemia, unspecified: Secondary | ICD-10-CM | POA: Insufficient documentation

## 2018-01-14 DIAGNOSIS — J449 Chronic obstructive pulmonary disease, unspecified: Secondary | ICD-10-CM | POA: Diagnosis not present

## 2018-01-14 DIAGNOSIS — E538 Deficiency of other specified B group vitamins: Secondary | ICD-10-CM | POA: Insufficient documentation

## 2018-01-14 DIAGNOSIS — R0603 Acute respiratory distress: Secondary | ICD-10-CM | POA: Diagnosis not present

## 2018-01-14 DIAGNOSIS — R823 Hemoglobinuria: Secondary | ICD-10-CM

## 2018-01-14 LAB — CBC WITH DIFFERENTIAL/PLATELET
Abs Immature Granulocytes: 0.04 10*3/uL (ref 0.00–0.07)
Basophils Absolute: 0.1 10*3/uL (ref 0.0–0.1)
Basophils Relative: 1 %
Eosinophils Absolute: 0.2 10*3/uL (ref 0.0–0.5)
Eosinophils Relative: 2 %
HCT: 45 % (ref 39.0–52.0)
Hemoglobin: 14.9 g/dL (ref 13.0–17.0)
Immature Granulocytes: 0 %
Lymphocytes Relative: 20 %
Lymphs Abs: 2 10*3/uL (ref 0.7–4.0)
MCH: 29.3 pg (ref 26.0–34.0)
MCHC: 33.1 g/dL (ref 30.0–36.0)
MCV: 88.6 fL (ref 80.0–100.0)
Monocytes Absolute: 1.2 10*3/uL — ABNORMAL HIGH (ref 0.1–1.0)
Monocytes Relative: 12 %
Neutro Abs: 6.4 10*3/uL (ref 1.7–7.7)
Neutrophils Relative %: 65 %
Platelets: 269 10*3/uL (ref 150–400)
RBC: 5.08 MIL/uL (ref 4.22–5.81)
RDW: 14.2 % (ref 11.5–15.5)
WBC: 9.9 10*3/uL (ref 4.0–10.5)
nRBC: 0 % (ref 0.0–0.2)

## 2018-01-14 LAB — FOLATE: Folate: 11.9 ng/mL (ref >5.9)

## 2018-01-14 LAB — FERRITIN: Ferritin: 62 ng/mL (ref 24–336)

## 2018-01-14 LAB — VITAMIN B12: Vitamin B-12: 300 pg/mL (ref 180–914)

## 2018-01-14 NOTE — Progress Notes (Signed)
Here for follow up " im doing good " " my energy is pretty good " per pt minimal sob but stated this is related to COPD .

## 2018-01-14 NOTE — Progress Notes (Signed)
[facility] day:  01/14/2018  Chief Complaint: Bradley [name] is a 67 y.o. male with iron deficiency anemia on oral iron who is seen for 6 month assessment.   HPI:  The patient was last seen in the hematology clinic on 10/08/2017.  At that time, he was doing well overall. Previously reported hip pain had resolved following steroid injections. Patient denied any B symptoms. He denied any interval infections. Exam was unremarkable.   Ferritin was low at 31. He was not consistently taking oral iron as prescribed. Patient encouraged to take oral iron supplement with a source of vitamin C daily.  He was encouraged to take his oral B12.  He was to be seen by urology for a history of hemoglobinuria.  He was seen in the [facility] ER on 11/30/2017 with atraumatic neck swelling.  Exam revealed a mobile 1 x 2 cm mass at the angle of the left jaw immediately inferior to the left ear.  He was scheduled to follow-up with Dr. Richardson Landry, ENT.  Soft tissue neck ultrasound on 12/03/2017 revealed a typical appearing lymph node in the are of concern.  Soft tissue neck CT on 12/22/2017 revealed the area of clinical concern corresponded to the posterior left parotid gland.  There was no parotid or other regional mass or abnormality identified.  There were no acute findings in the neck. There was a small, partially calcified retention cysts in the left vallecula, stable   During the interim, patient is doing well. He notes that his energy is "good". He states, "I am not out running like the wind, but I am doing well". He denies cough. He continues to to have exertional shortness of breath. Patient denies bleeding; no hematochezia, melena, or gross hematuria. Patient denies that he has experienced any B symptoms. He denies any interval infections.   Patient has not been taking his oral B12 as directed. He notes that he has been off of supplementation for 3 months or so.   Patient is  scheduled for a cardiac stress test on 01/26/2018.  Patient advises that he maintains an adequate appetite. He is eating well. Weight today is 172 lb 4.6 oz (78.1 kg), which compared to his last visit to the clinic, represents a 15 pound weight gain.   Patient denies pain in the clinic today.   Past Medical History:  Diagnosis Date  . COPD (chronic obstructive pulmonary disease) ([place])   . Coronary artery disease   . Diabetes mellitus without complication ([place])   . Hypertension     Past Surgical History:  Procedure Laterality Date  . LUNG BIOPSY Right    2006  . right side partial lobe removed       Family History  Problem Relation Age of Onset  . Cancer Mother   . Heart attack Father   . Cancer Sister     Social History:  reports that he quit smoking about 18 months ago. He has a 52.00 pack-year smoking history. He has never used smokeless tobacco. He reports that he does not drink alcohol or use drugs.  He smoked 1 pack/day since age 65 until 9 weeks ago.  He quit smoking on 06/18/2016 at 10 AM.  He denies any exposure to radiation or toxins.  He is retired from working for the [employer].  His wife died 5 months ago with "cancer in the blood" (? leukemia/lymphoma).  The patient is alone today.  Allergies: No Known  Allergies  Current Medications: Current Outpatient Medications  Medication Sig Dispense Refill  . albuterol (PROVENTIL) (2.5 MG/3ML) 0.083% nebulizer solution Take 2.5 mg by nebulization every 6 (six) hours as needed for wheezing or shortness of breath.    Marland Kitchen aspirin EC 81 MG tablet Take 81 mg by mouth daily.    . clopidogrel (PLAVIX) 75 MG tablet Take 75 mg by mouth daily.    . enalapril (VASOTEC) 10 MG tablet Take 10 mg by mouth daily.    . ferrous sulfate 325 (65 FE) MG EC tablet Take 1 tablet (325 mg total) by mouth daily. 30 tablet 3  . Fluticasone-Salmeterol (ADVAIR) 250-50 MCG/DOSE AEPB Inhale 1 puff into the lungs 2 (two) times daily. 60 each 0  .  glucose blood ([employer] TEST STRIPS) test strip Use once daily. Use as instructed. DX: E11.9    . metFORMIN (GLUCOPHAGE) 500 MG tablet Take 1 tablet (500 mg total) by mouth 2 (two) times daily with a meal. Takes every morning and then takes another one in the evening if his "sugar starts acting up" (Patient taking differently: Take 500 mg by mouth daily. Takes every morning and then takes another one in the evening if his "sugar starts acting up") 60 tablet 0  . metoprolol (LOPRESSOR) 50 MG tablet Take 50 mg by mouth 2 (two) times daily.    Marland Kitchen PROAIR HFA 108 (90 Base) MCG/ACT inhaler Inhale 2 puffs into the lungs every 6 (six) hours as needed for wheezing or shortness of breath.    . sildenafil (REVATIO) 20 MG tablet Take 20 mg by mouth daily as needed.    . simvastatin (ZOCOR) 20 MG tablet Take 20 mg by mouth at bedtime.     Marland Kitchen tiotropium (SPIRIVA) 18 MCG inhalation capsule Place 1 capsule (18 mcg total) into inhaler and inhale daily. 30 capsule 0  . BREO ELLIPTA 100-25 MCG/INH AEPB INHALE 1 PUFF BY MOUTH ONCE DAILY  11  . nitroGLYCERIN (NITROSTAT) 0.4 MG SL tablet Place 0.4 mg under the tongue every 5 (five) minutes as needed for chest pain.     No current facility-administered medications for this visit.     Review of Systems  Constitutional: Negative for chills, diaphoresis, fever and malaise/fatigue. Weight loss: weight up 15 pounds.       Energy level is good.  HENT: Negative.  Negative for congestion, nosebleeds, sinus pain and sore throat.   Eyes: Negative.  Negative for blurred vision, double vision and photophobia.  Respiratory: Positive for cough (little due to COPD) and shortness of breath (exertional). Negative for hemoptysis and sputum production.   Cardiovascular: Negative.  Negative for chest pain, palpitations, orthopnea, leg swelling and PND.  Gastrointestinal: Negative.  Negative for abdominal pain, blood in stool, constipation, diarrhea, melena, nausea and vomiting.   Genitourinary: Negative.  Negative for frequency, hematuria and urgency.  Musculoskeletal: Negative.  Negative for back pain, falls, joint pain, myalgias and neck pain.       Hip feels good.  Skin: Negative.  Negative for itching and rash.  Neurological: Negative.  Negative for dizziness, tremors, sensory change, speech change, weakness and headaches.  Endo/Heme/Allergies: Negative.  Does not bruise/bleed easily.  Psychiatric/Behavioral: Negative for depression and memory loss. The patient is not nervous/anxious and does not have insomnia.   All other systems reviewed and are negative.  Performance status (ECOG): 0  Vital Signs BP 130/81 (BP Location: Left Arm, Patient Position: Sitting)   Pulse 76   Temp (!) 95.5 F (35.3  C) (Tympanic)   Resp 18   Wt 172 lb 4.6 oz (78.1 kg)   BMI 26.98 kg/m   Physical Exam  Constitutional: He is oriented to person, place, and time and well-developed, well-nourished, and in no distress. No distress.  HENT:  Head: Normocephalic and atraumatic.  Right Ear: External ear normal.  Mouth/Throat: Oropharynx is clear and moist. No oropharyngeal exudate.  Gray hair with goatee.  Male pattern baldness. Poor dentition.  Eyes: Pupils are equal, round, and reactive to light. Conjunctivae and EOM are normal. No scleral icterus.  Glasses. Blue eyes.   Neck: Normal range of motion. Neck supple. No JVD present.  Cardiovascular: Normal rate, regular rhythm and normal heart sounds. Exam reveals no gallop and no friction rub.  No murmur heard. Pulmonary/Chest: Effort normal and breath sounds normal. No respiratory distress. He has no wheezes. He has no rales.  Abdominal: Soft. Bowel sounds are normal. He exhibits no distension and no mass. There is no abdominal tenderness. There is no rebound and no guarding.  Musculoskeletal: Normal range of motion.        General: No tenderness or edema.  Lymphadenopathy:    He has no cervical adenopathy.  Neurological: He is  alert and oriented to person, place, and time.  Skin: Skin is warm and dry. No rash noted. He is not diaphoretic. No erythema. No pallor.  Psychiatric: Mood, affect and judgment normal.  Nursing note and vitals reviewed.   Appointment on 01/14/2018  Component Date Value Ref Range Status  . WBC 01/14/2018 9.9  4.0 - 10.5 K/uL Final  . RBC 01/14/2018 5.08  4.22 - 5.81 MIL/uL Final  . Hemoglobin 01/14/2018 14.9  13.0 - 17.0 g/dL Final  . HCT 01/14/2018 45.0  39.0 - 52.0 % Final  . MCV 01/14/2018 88.6  80.0 - 100.0 fL Final  . MCH 01/14/2018 29.3  26.0 - 34.0 pg Final  . MCHC 01/14/2018 33.1  30.0 - 36.0 g/dL Final  . RDW 01/14/2018 14.2  11.5 - 15.5 % Final  . Platelets 01/14/2018 269  150 - 400 K/uL Final  . nRBC 01/14/2018 0.0  0.0 - 0.2 % Final  . Neutrophils Relative % 01/14/2018 65  % Final  . Neutro Abs 01/14/2018 6.4  1.7 - 7.7 K/uL Final  . Lymphocytes Relative 01/14/2018 20  % Final  . Lymphs Abs 01/14/2018 2.0  0.7 - 4.0 K/uL Final  . Monocytes Relative 01/14/2018 12  % Final  . Monocytes Absolute 01/14/2018 1.2* 0.1 - 1.0 K/uL Final  . Eosinophils Relative 01/14/2018 2  % Final  . Eosinophils Absolute 01/14/2018 0.2  0.0 - 0.5 K/uL Final  . Basophils Relative 01/14/2018 1  % Final  . Basophils Absolute 01/14/2018 0.1  0.0 - 0.1 K/uL Final  . Immature Granulocytes 01/14/2018 0  % Final  . Abs Immature Granulocytes 01/14/2018 0.04  0.00 - 0.07 K/uL Final   Performed at [facility], [address], [place], [place] [address]    Assessment:  Bradley [name] is a 67 y.o. male with mild anemia with progressive microcytic indices over the past 2 years.  Diet is good.  He denies any melena, hematochezia or hematuria.  Colonoscopy on 07/01/2012 revealed diverticulosis.  He believes he had an EGD 10 years ago.  Work-up on 08/07/2016 revealed a hematocrit of 41.5, hemoglobin 13.7, MCV 81.9, platelets 337,000, white count 9300 with an Obert of 5800.  Normal studies included:   SPEP, folate, iron saturation (24%),  TIBC (399), sed rate, retic (1.3%), TSH.  He has mild hypogammaglobulinemia with an IgG of 601 (700 - 1600).  IgG was 562 on 08/03/2014.  Ferritin was 16 (low).  B12 was 319 (low normal) with a normal MMA r/o B12 deficiency.  Free light chains were normal. 24 hour UPEP revealed 112 mg/24 hours (30-150).  Immunofixation was normal.  Urinalysis revealed trace hemoglobin.  One month prior urine hemoglobin was small.  Guaiac cards were negative x 3. Patient currently on Ferrous Sulfate supplementation; not taking with Vitamin C. Discussed dietary recommendations.   Ferritin has been followed: 16 on 08/07/2016, 30 on 11/19/2016, 37 on 03/19/2017, 23 on 06/18/2017, 31 on 10/08/2017, and 62 on 01/14/2018.  He has been hospitalized 2 times/year in the past 3 years for pneumonia. He has COPD.  He denies any other infections. IgG was 711 (normal) on 03/19/2017.  [facility] on 07/12/2016 revealed postsurgical changes status post partial lower lobe resection.  There was heterogeneous opacity at the resection site favoring to represent atelectasis with  possible superimposed inflammatory/infectious process. There were other small nodules within the right lower lobe demonstrating minimal activity and stable.  Low dose chest CT on 09/03/2016 demonstrated small mediastinal lymph nodes that did not meet pathologic CT size criteria. There were scattered bilateral pulmonary nodules measuring up to 5.3 mm noted in the RIGHT middle lobe. There was a 6.3 mm calcified granuloma in the posterior LEFT upper lobe.   Low dose chest CT on 09/12/2017 revealed Lung-RADS 2, benign appearance or behavior. There was scattered bilateral pulmonary nodules (up to 5.2 mm).  There was a stable 1.1 cm nodular opacity in the lateral RLL (favored RML).  Continue annual screening with low-dose chest CT without contrast in 12 months was recommended.  Symptomatically, he is doing well.  Exam is  stable.  Plan: 1. Labs today:  CBC with diff, ferritin, B12, folate. 2.   Iron deficiency anemia - stable Hemoglobin 14.9, hematocrit 45.0, MCV 88.6 Ferritin 62. Continue oral iron with a source of vitamin C daily. 3.   Hemoglobinuria Patient notes hemaglobinuria work-up negative. 4.   B12 deficiency - ongoing B12 level previous low, with a normal MMA.  Patient not taking B12  x 3 months. Encourage to restart B12 1,000 mcg supplement daily.  Recheck B12 level in 1 month. 5.   RTC in 1 month for labs (CBC with diff, B12). 6.   RTC in 3 months for MD assessment, labs (CBC with diff, CMP, ferritin).   Honor Loh, NP  01/14/2018, 2:47 PM   I saw and evaluated the patient, participating in the key portions of the service and reviewing pertinent diagnostic studies and records.  I reviewed the nurse practitioner's note and agree with the findings and the plan.  The assessment and plan were discussed with the patient.  A few questions were asked by the patient and answered.   Nolon Stalls, MD 01/14/2018,2:47 PM

## 2018-01-15 ENCOUNTER — Telehealth: Payer: Self-pay | Admitting: *Deleted

## 2018-01-15 ENCOUNTER — Other Ambulatory Visit: Payer: Self-pay | Admitting: *Deleted

## 2018-01-15 DIAGNOSIS — E538 Deficiency of other specified B group vitamins: Secondary | ICD-10-CM

## 2018-01-15 NOTE — Telephone Encounter (Signed)
Called patient to inform him that his B-12 is low. MD recommends taking 1000 mcg oral B-12 daily.  Will recheck levels in one month.  If not better at that time MD recommends B-12 injections. Patient verbalized understanding.

## 2018-01-15 NOTE — Telephone Encounter (Signed)
-----   Message from Lequita Asal, MD sent at 01/15/2018 11:18 AM EST ----- Regarding: Please let patient know about low B12  Encourage him to take his oral B12 daily for 1 month.  We recheck levels next month.  If still low at that time, we will begin B12 injections.  M  ----- Message ----- From: Interface, Lab In [place] Sent: 01/14/2018   1:29 PM EST To: Lequita Asal, MD

## 2018-01-20 ENCOUNTER — Other Ambulatory Visit: Payer: Self-pay | Admitting: Pharmacy Technician

## 2018-01-20 NOTE — Patient Outreach (Signed)
[place] [facility]) Care Management  01/20/2018  QUY LOTTS [birth year] [id number]   Successful call to patients daughter, [name], [place] identifiers verified. [name] states that patients primary recently switched him to Adair Patter and her father states it seems to do exactly what his Advair was doing.... He informed her that he would still like to be switched to the [facility] since upon approval thru [employer] he could get the medication free. Informed [name] that [facility] does have a patient assistance program but it would require the $600 OOP spend and he was not able to meet that requirement for his Advair for 2019. [name] stated that she would let her father know that and they would inform his provider and that she would mail the [employer] patient assistance application back for [facility] and Proventil HFA.  I mailed out the provider portion of application to his primary, Dr. Kym Groom.  Will submit completed application once all documents are received but after December 2, per [employer] guidelines.  Maud Deed Chana Bode [place] Certified Pharmacy Technician [place] Management Direct Dial:[phone]

## 2018-01-26 ENCOUNTER — Ambulatory Visit: Payer: Self-pay | Admitting: Pharmacist

## 2018-01-26 DIAGNOSIS — I10 Essential (primary) hypertension: Secondary | ICD-10-CM | POA: Diagnosis not present

## 2018-01-26 DIAGNOSIS — R0602 Shortness of breath: Secondary | ICD-10-CM | POA: Diagnosis not present

## 2018-02-03 DIAGNOSIS — I10 Essential (primary) hypertension: Secondary | ICD-10-CM | POA: Diagnosis not present

## 2018-02-03 DIAGNOSIS — J439 Emphysema, unspecified: Secondary | ICD-10-CM | POA: Diagnosis not present

## 2018-02-03 DIAGNOSIS — R0603 Acute respiratory distress: Secondary | ICD-10-CM | POA: Diagnosis not present

## 2018-02-03 DIAGNOSIS — G4730 Sleep apnea, unspecified: Secondary | ICD-10-CM | POA: Diagnosis not present

## 2018-02-10 DIAGNOSIS — I2510 Atherosclerotic heart disease of native coronary artery without angina pectoris: Secondary | ICD-10-CM | POA: Diagnosis not present

## 2018-02-10 DIAGNOSIS — Z87891 Personal history of nicotine dependence: Secondary | ICD-10-CM | POA: Diagnosis not present

## 2018-02-10 DIAGNOSIS — Z122 Encounter for screening for malignant neoplasm of respiratory organs: Secondary | ICD-10-CM | POA: Diagnosis not present

## 2018-02-10 DIAGNOSIS — R918 Other nonspecific abnormal finding of lung field: Secondary | ICD-10-CM | POA: Diagnosis not present

## 2018-02-11 ENCOUNTER — Inpatient Hospital Stay: Payer: PPO | Attending: Hematology and Oncology

## 2018-02-11 DIAGNOSIS — Z79899 Other long term (current) drug therapy: Secondary | ICD-10-CM | POA: Diagnosis not present

## 2018-02-11 DIAGNOSIS — E538 Deficiency of other specified B group vitamins: Secondary | ICD-10-CM

## 2018-02-11 DIAGNOSIS — D509 Iron deficiency anemia, unspecified: Secondary | ICD-10-CM | POA: Insufficient documentation

## 2018-02-11 LAB — CBC WITH DIFFERENTIAL/PLATELET
Abs Immature Granulocytes: 0.05 10*3/uL (ref 0.00–0.07)
Basophils Absolute: 0 10*3/uL (ref 0.0–0.1)
Basophils Relative: 1 %
Eosinophils Absolute: 0.1 10*3/uL (ref 0.0–0.5)
Eosinophils Relative: 1 %
HCT: 44.1 % (ref 39.0–52.0)
Hemoglobin: 14.5 g/dL (ref 13.0–17.0)
Immature Granulocytes: 1 %
Lymphocytes Relative: 19 %
Lymphs Abs: 1.6 10*3/uL (ref 0.7–4.0)
MCH: 29.9 pg (ref 26.0–34.0)
MCHC: 32.9 g/dL (ref 30.0–36.0)
MCV: 90.9 fL (ref 80.0–100.0)
Monocytes Absolute: 0.9 10*3/uL (ref 0.1–1.0)
Monocytes Relative: 11 %
Neutro Abs: 5.5 10*3/uL (ref 1.7–7.7)
Neutrophils Relative %: 67 %
Platelets: 260 10*3/uL (ref 150–400)
RBC: 4.85 MIL/uL (ref 4.22–5.81)
RDW: 14.9 % (ref 11.5–15.5)
WBC: 8.1 10*3/uL (ref 4.0–10.5)
nRBC: 0 % (ref 0.0–0.2)

## 2018-02-13 DIAGNOSIS — J449 Chronic obstructive pulmonary disease, unspecified: Secondary | ICD-10-CM | POA: Diagnosis not present

## 2018-02-13 DIAGNOSIS — R0603 Acute respiratory distress: Secondary | ICD-10-CM | POA: Diagnosis not present

## 2018-02-13 LAB — VITAMIN B12: Vitamin B-12: 389 pg/mL (ref 180–914)

## 2018-02-19 DIAGNOSIS — E782 Mixed hyperlipidemia: Secondary | ICD-10-CM | POA: Diagnosis not present

## 2018-02-19 DIAGNOSIS — L602 Onychogryphosis: Secondary | ICD-10-CM | POA: Diagnosis not present

## 2018-02-19 DIAGNOSIS — J441 Chronic obstructive pulmonary disease with (acute) exacerbation: Secondary | ICD-10-CM | POA: Diagnosis not present

## 2018-02-19 DIAGNOSIS — I10 Essential (primary) hypertension: Secondary | ICD-10-CM | POA: Diagnosis not present

## 2018-02-19 DIAGNOSIS — R05 Cough: Secondary | ICD-10-CM | POA: Diagnosis not present

## 2018-02-19 DIAGNOSIS — E119 Type 2 diabetes mellitus without complications: Secondary | ICD-10-CM | POA: Diagnosis not present

## 2018-02-23 ENCOUNTER — Other Ambulatory Visit: Payer: Self-pay | Admitting: Pharmacy Technician

## 2018-02-23 NOTE — Patient Outreach (Signed)
[place] [facility]) Care Management  02/23/2018  Bradley [name] 1950/11/23 [id number]   Received provider portion of [employer] patient assistance application for [facility] and Proventil HFA. Prepared completed application to be mailed to company.  Will follow up with company in 10-14 business days  [employer]. Chana Bode [place] Certified Pharmacy Technician [place] Management Direct Dial:[phone]

## 2018-03-02 DIAGNOSIS — M79672 Pain in left foot: Secondary | ICD-10-CM | POA: Diagnosis not present

## 2018-03-02 DIAGNOSIS — B351 Tinea unguium: Secondary | ICD-10-CM | POA: Diagnosis not present

## 2018-03-02 DIAGNOSIS — E119 Type 2 diabetes mellitus without complications: Secondary | ICD-10-CM | POA: Diagnosis not present

## 2018-03-02 DIAGNOSIS — M79671 Pain in right foot: Secondary | ICD-10-CM | POA: Diagnosis not present

## 2018-03-07 ENCOUNTER — Encounter: Payer: Self-pay | Admitting: Hematology and Oncology

## 2018-03-16 DIAGNOSIS — R0603 Acute respiratory distress: Secondary | ICD-10-CM | POA: Diagnosis not present

## 2018-03-16 DIAGNOSIS — R0609 Other forms of dyspnea: Secondary | ICD-10-CM | POA: Diagnosis not present

## 2018-03-16 DIAGNOSIS — J449 Chronic obstructive pulmonary disease, unspecified: Secondary | ICD-10-CM | POA: Diagnosis not present

## 2018-03-16 DIAGNOSIS — J439 Emphysema, unspecified: Secondary | ICD-10-CM | POA: Diagnosis not present

## 2018-03-27 ENCOUNTER — Other Ambulatory Visit: Payer: Self-pay | Admitting: Pharmacy Technician

## 2018-03-27 NOTE — Patient Outreach (Signed)
[place] [facility]) Care Management  03/27/2018  Bradley [name] 1950/07/30 [id number]    Follow up call placed to [employer] regarding patient assistance application(s) for [facility] and Proventil HFA , [name] confirms that application has been received and attestation form was mailed out to patient on 03/24/20.    Successful call placed to patients [name] regarding patient assistance update for [facility] and Proventil HFA, HIPAA identifiers verified. Requested that [name] contact me when attestation form has been received so that I may assist with filling it out.  Will follow up with [name] in 3-5 business days if call has not been returned.  Maud Deed Chana Bode [place] Certified Pharmacy Technician [place] Management Direct Dial:[phone]

## 2018-04-09 NOTE — Patient Outreach (Signed)
[facility]) Care Management  04/09/2018  Late entry  Bradley [name] Apr 13, 1950 [id number]   [place] attempted follow up outreach call to patient.  Patient was unavailable. HIPPA compliance voicemail message left with return callback number.  Plan: RN will call patient again within30 days.  [place] Care Management [phone]

## 2018-04-10 ENCOUNTER — Other Ambulatory Visit: Payer: Self-pay | Admitting: *Deleted

## 2018-04-10 NOTE — Patient Outreach (Signed)
[name] Care Management  04/10/2018   Bradley [name] [birth year] [id number]  RN Health Coach telephone call to patient.  [employer] compliance verified. Per patient she went and had the lung nodule checked and it was negative.  Patient stated that he has an increase shortness of breath on exertion. Patient stated that sometimes he has to use the oxygen more. He is having swelling in his feet the left more that right but both are swelling and increase swelling in the abdominal area . Per patient he is using his inhalers as per ordered. RN discussed with patient weighing daily and keeping a record and making his Dr aware of his symptoms. Patient has agreed to follow up outreach calls.  Current Medications:  Current Outpatient Medications  Medication Sig Dispense Refill  . albuterol (PROVENTIL) (2.5 MG/3ML) 0.083% nebulizer solution Take 2.5 mg by nebulization every 6 (six) hours as needed for wheezing or shortness of breath.    . Fluticasone-Salmeterol (ADVAIR) 250-50 MCG/DOSE AEPB Inhale 1 puff into the lungs 2 (two) times daily. 60 each 0  . tiotropium (SPIRIVA) 18 MCG inhalation capsule Place 1 capsule (18 mcg total) into inhaler and inhale daily. 30 capsule 0  . aspirin EC 81 MG tablet Take 81 mg by mouth daily.    Marland Kitchen BREO ELLIPTA 100-25 MCG/INH AEPB INHALE 1 PUFF BY MOUTH ONCE DAILY  11  . clopidogrel (PLAVIX) 75 MG tablet Take 75 mg by mouth daily.    . enalapril (VASOTEC) 10 MG tablet Take 10 mg by mouth daily.    . ferrous sulfate 325 (65 FE) MG EC tablet Take 1 tablet (325 mg total) by mouth daily. 30 tablet 3  . glucose blood ([employer] TEST STRIPS) test strip Use once daily. Use as instructed. DX: E11.9    . metFORMIN (GLUCOPHAGE) 500 MG tablet Take 1 tablet (500 mg total) by mouth 2 (two) times daily with a meal. Takes every morning and then takes another one in the evening if his "sugar starts acting up" (Patient taking differently: Take 500 mg by mouth daily. Takes every  morning and then takes another one in the evening if his "sugar starts acting up") 60 tablet 0  . metoprolol (LOPRESSOR) 50 MG tablet Take 50 mg by mouth 2 (two) times daily.    . nitroGLYCERIN (NITROSTAT) 0.4 MG SL tablet Place 0.4 mg under the tongue every 5 (five) minutes as needed for chest pain.    Marland Kitchen PROAIR HFA 108 (90 Base) MCG/ACT inhaler Inhale 2 puffs into the lungs every 6 (six) hours as needed for wheezing or shortness of breath.    . sildenafil (REVATIO) 20 MG tablet Take 20 mg by mouth daily as needed.    . simvastatin (ZOCOR) 20 MG tablet Take 20 mg by mouth at bedtime.      No current facility-administered medications for this visit.     Functional Status:  In your present state of health, do you have any difficulty performing the following activities: 04/10/2018 09/02/2017  Hearing? N N  Vision? N N  Difficulty concentrating or making decisions? N N  Walking or climbing stairs? Y Y  Dressing or bathing? Y Y  Doing errands, shopping? N N  Preparing Food and eating ? N N  Using the Toilet? N N  In the past six months, have you accidently leaked urine? N N  Do you have problems with loss of bowel control? N N  Managing your Medications? N N  Managing your  Finances? N N  Housekeeping or managing your Housekeeping? N N  Some recent data might be hidden    Fall/Depression Screening: Fall Risk  04/10/2018 10/06/2017 09/02/2017  Falls in the past year? 0 No No   PHQ 2/9 Scores 04/10/2018 10/06/2017 09/02/2017 08/28/2017 01/08/2016  PHQ - 2 Score 0 0 0 0 0    Assessment:   Increase shortness of breath with min exertion Swelling in bilateral extremities Swelling in abdomen Patient is using medications as prescribed Patient is have to use oxygen more frequently    Plan:  RN discussed patient current symptoms RN discussed keeping a record and making physician aware RN discussed daily weight RN sent low sodium diet RN sent a picture sheet of foods low in sodium RN will follow up  within the month of May Patient will notify [place] after PCP visit  [place] Management [phone]

## 2018-04-13 ENCOUNTER — Other Ambulatory Visit: Payer: Self-pay | Admitting: Pharmacy Technician

## 2018-04-13 NOTE — Patient Outreach (Signed)
[place] [facility]) Care Management  04/13/2018  MIKA ANASTASI [birth year] [id number]    Follow up call placed to [employer] regarding patient assistance application(s) for [facility] and Proventil HFA , Elmyra Ricks confirms that attestation form has been received. Patient has been approved as of 2/06 until 03/04/19.   Successful call placed to patients daughter, [name] regarding patient assistance update for [facility] and Proventil HFA, HIPAA identifiers verified. Informed [name] of approval and provided her with [facility] pharmacy phone number. Suggested she contact them and check shipment status sometime in the next week or so. She stated she would. [name] also confirms that dad received Spiriva refill that was requested for him on 1/24.  Will follow up with [name] in 7-10 business days to confirm [facility] and Proventil has been received from [employer].  Maud Deed Chana Bode [place] Certified Pharmacy Technician [place] Management Direct [phone]

## 2018-04-15 ENCOUNTER — Other Ambulatory Visit: Payer: PPO

## 2018-04-15 ENCOUNTER — Ambulatory Visit: Payer: PPO | Admitting: Hematology and Oncology

## 2018-04-16 ENCOUNTER — Ambulatory Visit: Payer: PPO | Admitting: Hematology and Oncology

## 2018-04-16 ENCOUNTER — Other Ambulatory Visit: Payer: PPO

## 2018-04-16 ENCOUNTER — Inpatient Hospital Stay (HOSPITAL_BASED_OUTPATIENT_CLINIC_OR_DEPARTMENT_OTHER): Payer: PPO | Admitting: Hematology and Oncology

## 2018-04-16 ENCOUNTER — Other Ambulatory Visit: Payer: Self-pay | Admitting: Hematology and Oncology

## 2018-04-16 ENCOUNTER — Encounter: Payer: Self-pay | Admitting: Hematology and Oncology

## 2018-04-16 ENCOUNTER — Inpatient Hospital Stay: Payer: PPO | Attending: Hematology and Oncology

## 2018-04-16 VITALS — BP 109/71 | HR 85 | Temp 97.4°F | Resp 18 | Ht 67.0 in | Wt 177.6 lb

## 2018-04-16 DIAGNOSIS — Z79899 Other long term (current) drug therapy: Secondary | ICD-10-CM | POA: Insufficient documentation

## 2018-04-16 DIAGNOSIS — D509 Iron deficiency anemia, unspecified: Secondary | ICD-10-CM

## 2018-04-16 DIAGNOSIS — E538 Deficiency of other specified B group vitamins: Secondary | ICD-10-CM

## 2018-04-16 LAB — CBC WITH DIFFERENTIAL/PLATELET
Abs Immature Granulocytes: 0.05 10*3/uL (ref 0.00–0.07)
Basophils Absolute: 0.1 10*3/uL (ref 0.0–0.1)
Basophils Relative: 1 %
Eosinophils Absolute: 0.2 10*3/uL (ref 0.0–0.5)
Eosinophils Relative: 2 %
HCT: 44.4 % (ref 39.0–52.0)
Hemoglobin: 14.9 g/dL (ref 13.0–17.0)
Immature Granulocytes: 1 %
Lymphocytes Relative: 21 %
Lymphs Abs: 1.8 10*3/uL (ref 0.7–4.0)
MCH: 31 pg (ref 26.0–34.0)
MCHC: 33.6 g/dL (ref 30.0–36.0)
MCV: 92.3 fL (ref 80.0–100.0)
Monocytes Absolute: 1.3 10*3/uL — ABNORMAL HIGH (ref 0.1–1.0)
Monocytes Relative: 15 %
Neutro Abs: 5.2 10*3/uL (ref 1.7–7.7)
Neutrophils Relative %: 60 %
Platelets: 268 10*3/uL (ref 150–400)
RBC: 4.81 MIL/uL (ref 4.22–5.81)
RDW: 13.7 % (ref 11.5–15.5)
WBC: 8.7 10*3/uL (ref 4.0–10.5)
nRBC: 0 % (ref 0.0–0.2)

## 2018-04-16 LAB — COMPREHENSIVE METABOLIC PANEL
ALT: 22 U/L (ref 0–44)
AST: 23 U/L (ref 15–41)
Albumin: 3.8 g/dL (ref 3.5–5.0)
Alkaline Phosphatase: 73 U/L (ref 38–126)
Anion gap: 9 (ref 5–15)
BUN: 13 mg/dL (ref 8–23)
CO2: 26 mmol/L (ref 22–32)
Calcium: 8.7 mg/dL — ABNORMAL LOW (ref 8.9–10.3)
Chloride: 100 mmol/L (ref 98–111)
Creatinine, Ser: 0.69 mg/dL (ref 0.61–1.24)
GFR calc Af Amer: >60 mL/min (ref >60)
GFR calc non Af Amer: >60 mL/min (ref >60)
Glucose, Bld: 125 mg/dL — ABNORMAL HIGH (ref 70–99)
Potassium: 4.1 mmol/L (ref 3.5–5.1)
Sodium: 135 mmol/L (ref 135–145)
Total Bilirubin: 0.8 mg/dL (ref 0.3–1.2)
Total Protein: 7.2 g/dL (ref 6.5–8.1)

## 2018-04-16 LAB — VITAMIN B12: Vitamin B-12: 1483 pg/mL — ABNORMAL HIGH (ref 180–914)

## 2018-04-16 LAB — FERRITIN: Ferritin: 50 ng/mL (ref 24–336)

## 2018-04-16 NOTE — Progress Notes (Signed)
No new changes noted today 

## 2018-04-16 NOTE — Progress Notes (Deleted)
[name] day:  04/16/2018  Chief Complaint: Bradley [name] is a 68 y.o. male with iron deficiency anemia on oral iron who is seen for 41month assessment.   HPI:  The patient was last seen in the hematology clinic on 01/14/2018.  At that time, he was doing well.  Exam was stable.  Hematocrit was 45.0, hemoglobin 14.9, and MCV 88.  Ferritin 62. We discussed continuation of oral iron with a source of vitamin C daily.  Ferritin goal was 100.  B12 was 300.  Folate was 11.9.  We discussed restarting oral B12.  B12 goal was 400.  Labs on 02/11/2018 revealed a hematocrit of 44.1, hemoglobin 14.5, MCV 90.9, platelets 260,000, WBC 8100 with an [place] of 5500.  B12 was 389.    Past Medical History:  Diagnosis Date  . COPD (chronic obstructive pulmonary disease) ([place])   . Coronary artery disease   . Diabetes mellitus without complication ([place])   . Hypertension     Past Surgical History:  Procedure Laterality Date  . LUNG BIOPSY Right    2006  . right side partial lobe removed       Family History  Problem Relation Age of Onset  . Cancer Mother   . Heart attack Father   . Cancer Sister     Social History:  reports that he quit smoking about 21 months ago. He has a 52.00 pack-year smoking history. He has never used smokeless tobacco. He reports that he does not drink alcohol or use drugs.  He smoked 1 pack/day since age 47 until 9 weeks ago.  He quit smoking on 06/18/2016 at 10 AM.  He denies any exposure to radiation or toxins.  He is retired from working for the [employer].  His wife died 41 months ago with "cancer in the blood" (? leukemia/lymphoma).  The patient is alone today.  Allergies: No Known Allergies  Current Medications: Current Outpatient Medications  Medication Sig Dispense Refill  . albuterol (PROVENTIL) (2.5 MG/3ML) 0.083% nebulizer solution Take 2.5 mg by nebulization every 6 (six) hours as needed for wheezing or shortness of  breath.    Marland Kitchen aspirin EC 81 MG tablet Take 81 mg by mouth daily.    Marland Kitchen BREO ELLIPTA 100-25 MCG/INH AEPB INHALE 1 PUFF BY MOUTH ONCE DAILY  11  . clopidogrel (PLAVIX) 75 MG tablet Take 75 mg by mouth daily.    . enalapril (VASOTEC) 10 MG tablet Take 10 mg by mouth daily.    . ferrous sulfate 325 (65 FE) MG EC tablet Take 1 tablet (325 mg total) by mouth daily. 30 tablet 3  . Fluticasone-Salmeterol (ADVAIR) 250-50 MCG/DOSE AEPB Inhale 1 puff into the lungs 2 (two) times daily. 60 each 0  . glucose blood ([employer] TEST STRIPS) test strip Use once daily. Use as instructed. DX: E11.9    . metFORMIN (GLUCOPHAGE) 500 MG tablet Take 1 tablet (500 mg total) by mouth 2 (two) times daily with a meal. Takes every morning and then takes another one in the evening if his "sugar starts acting up" (Patient taking differently: Take 500 mg by mouth daily. Takes every morning and then takes another one in the evening if his "sugar starts acting up") 60 tablet 0  . metoprolol (LOPRESSOR) 50 MG tablet Take 50 mg by mouth 2 (two) times daily.    . nitroGLYCERIN (NITROSTAT) 0.4 MG SL tablet Place 0.4 mg under the tongue every 5 (five)  minutes as needed for chest pain.    Marland Kitchen PROAIR HFA 108 (90 Base) MCG/ACT inhaler Inhale 2 puffs into the lungs every 6 (six) hours as needed for wheezing or shortness of breath.    . sildenafil (REVATIO) 20 MG tablet Take 20 mg by mouth daily as needed.    . simvastatin (ZOCOR) 20 MG tablet Take 20 mg by mouth at bedtime.     Marland Kitchen tiotropium (SPIRIVA) 18 MCG inhalation capsule Place 1 capsule (18 mcg total) into inhaler and inhale daily. 30 capsule 0   No current facility-administered medications for this visit.     Review of Systems  Constitutional: Negative for chills, diaphoresis, fever and malaise/fatigue. Weight loss: weight up 15 pounds.       Energy level is good.  HENT: Negative.  Negative for congestion, nosebleeds, sinus pain and sore throat.   Eyes: Negative.  Negative for blurred  vision, double vision and photophobia.  Respiratory: Positive for cough (little due to COPD) and shortness of breath (exertional). Negative for hemoptysis and sputum production.   Cardiovascular: Negative.  Negative for chest pain, palpitations, orthopnea, leg swelling and PND.  Gastrointestinal: Negative.  Negative for abdominal pain, blood in stool, constipation, diarrhea, melena, nausea and vomiting.  Genitourinary: Negative.  Negative for frequency, hematuria and urgency.  Musculoskeletal: Negative.  Negative for back pain, falls, joint pain, myalgias and neck pain.       Hip feels good.  Skin: Negative.  Negative for itching and rash.  Neurological: Negative.  Negative for dizziness, tremors, sensory change, speech change, weakness and headaches.  Endo/Heme/Allergies: Negative.  Does not bruise/bleed easily.  Psychiatric/Behavioral: Negative for depression and memory loss. The patient is not nervous/anxious and does not have insomnia.   All other systems reviewed and are negative.  Performance status (ECOG): 0  Vital Signs There were no vitals taken for this visit.  Physical Exam  Constitutional: He is oriented to person, place, and time and well-developed, well-nourished, and in no distress. No distress.  HENT:  Head: Normocephalic and atraumatic.  Right Ear: External ear normal.  Mouth/Throat: Oropharynx is clear and moist. No oropharyngeal exudate.  Gray hair with goatee.  Male pattern baldness. Poor dentition.  Eyes: Pupils are equal, round, and reactive to light. Conjunctivae and EOM are normal. No scleral icterus.  Glasses. Blue eyes.   Neck: Normal range of motion. Neck supple. No JVD present.  Cardiovascular: Normal rate, regular rhythm and normal heart sounds. Exam reveals no gallop and no friction rub.  No murmur heard. Pulmonary/Chest: Effort normal and breath sounds normal. No respiratory distress. He has no wheezes. He has no rales.  Abdominal: Soft. Bowel sounds are  normal. He exhibits no distension and no mass. There is no abdominal tenderness. There is no rebound and no guarding.  Musculoskeletal: Normal range of motion.        General: No tenderness or edema.  Lymphadenopathy:    He has no cervical adenopathy.  Neurological: He is alert and oriented to person, place, and time.  Skin: Skin is warm and dry. No rash noted. He is not diaphoretic. No erythema. No pallor.  Psychiatric: Mood, affect and judgment normal.  Nursing note and vitals reviewed.   No visits with results within 3 Day(s) from this visit.  Latest known visit with results is:  Clinical Support on 02/11/2018  Component Date Value Ref Range Status  . WBC 02/11/2018 8.1  4.0 - 10.5 K/uL Final  . RBC 02/11/2018 4.85  4.22 -  5.81 MIL/uL Final  . Hemoglobin 02/11/2018 14.5  13.0 - 17.0 g/dL Final  . HCT 02/11/2018 44.1  39.0 - 52.0 % Final  . MCV 02/11/2018 90.9  80.0 - 100.0 fL Final  . MCH 02/11/2018 29.9  26.0 - 34.0 pg Final  . MCHC 02/11/2018 32.9  30.0 - 36.0 g/dL Final  . RDW 02/11/2018 14.9  11.5 - 15.5 % Final  . Platelets 02/11/2018 260  150 - 400 K/uL Final  . nRBC 02/11/2018 0.0  0.0 - 0.2 % Final  . Neutrophils Relative % 02/11/2018 67  % Final  . Neutro Abs 02/11/2018 5.5  1.7 - 7.7 K/uL Final  . Lymphocytes Relative 02/11/2018 19  % Final  . Lymphs Abs 02/11/2018 1.6  0.7 - 4.0 K/uL Final  . Monocytes Relative 02/11/2018 11  % Final  . Monocytes Absolute 02/11/2018 0.9  0.1 - 1.0 K/uL Final  . Eosinophils Relative 02/11/2018 1  % Final  . Eosinophils Absolute 02/11/2018 0.1  0.0 - 0.5 K/uL Final  . Basophils Relative 02/11/2018 1  % Final  . Basophils Absolute 02/11/2018 0.0  0.0 - 0.1 K/uL Final  . Immature Granulocytes 02/11/2018 1  % Final  . Abs Immature Granulocytes 02/11/2018 0.05  0.00 - 0.07 K/uL Final   Performed at [facility], [address], [place], [place] [address]  . Vitamin B-12 02/11/2018 389  180 - 914 pg/mL Final   Comment:  (NOTE) This assay is not validated for testing neonatal or myeloproliferative syndrome specimens for Vitamin B12 levels. Performed at [facility], [place] [address], [place], [place] [address]     Assessment:  KYDAN SHANHOLTZER is a 68 y.o. male with mild anemia with progressive microcytic indices over the past 2 years.  Diet is good.  He denies any melena, hematochezia or hematuria.  Colonoscopy on 07/01/2012 revealed diverticulosis.  He believes he had an EGD 10 years ago.  Work-up on 08/07/2016 revealed a hematocrit of 41.5, hemoglobin 13.7, MCV 81.9, platelets 337,000, white count 9300 with an [place] of 5800.  Normal studies included:  SPEP, folate, iron saturation (24%), TIBC (399), sed rate, retic (1.3%), TSH.  He has mild hypogammaglobulinemia with an IgG of 601 (700 - 1600).  IgG was 562 on 08/03/2014.  Ferritin was 16 (low).  B12 was 319 (low normal) with a normal MMA r/o B12 deficiency.  Free light chains were normal. 24 hour UPEP revealed 112 mg/24 hours (30-150).  Immunofixation was normal.  Urinalysis revealed trace hemoglobin.  One month prior urine hemoglobin was small.  Guaiac cards were negative x 3. Patient currently on Ferrous Sulfate supplementation; not taking with Vitamin C. Discussed dietary recommendations.   Ferritin has been followed: 16 on 08/07/2016, 30 on 11/19/2016, 37 on 03/19/2017, 23 on 06/18/2017, 31 on 10/08/2017, and 62 on 01/14/2018.  He has been hospitalized 2 times/year in the past 3 years for pneumonia. He has COPD.  He denies any other infections. IgG was 711 (normal) on 03/19/2017.  [facility] on 07/12/2016 revealed postsurgical changes status post partial lower lobe resection.  There was heterogeneous opacity at the resection site favoring to represent atelectasis with  possible superimposed inflammatory/infectious process. There were other small nodules within the right lower lobe demonstrating minimal activity and stable.  Low dose chest CT on  09/03/2016 demonstrated small mediastinal lymph nodes that did not meet pathologic CT size criteria. There were scattered bilateral pulmonary nodules measuring up to 5.3 mm noted  in the RIGHT middle lobe. There was a 6.3 mm calcified granuloma in the posterior LEFT upper lobe.   Low dose chest CT on 09/12/2017 revealed Lung-RADS 2, benign appearance or behavior. There was scattered bilateral pulmonary nodules (up to 5.2 mm).  There was a stable 1.1 cm nodular opacity in the lateral RLL (favored RML).  Continue annual screening with low-dose chest CT without contrast in 12 months was recommended.  Symptomatically,   Plan: 1. Labs today:  CBC with diff, CMP, ferritin, B12.   2.   Iron deficiency anemia - stable Hemoglobin 14.9, hematocrit 45.0, MCV 88.6 Ferritin 62. Continue oral iron with a source of vitamin C daily. 3.   Hemoglobinuria Patient notes hemaglobinuria work-up negative. 4.   B12 deficiency - ongoing B12 level previous low, with a normal MMA.  Patient not taking B12  x 3 months. Encourage to restart B12 1,000 mcg supplement daily.  Recheck B12 level in 1 month. 5.   RTC in 1 month for labs (CBC with diff, B12). 6.   RTC in 3 months for MD assessment, labs (CBC with diff, CMP, ferritin).   Lequita Asal, MD  04/16/2018, 5:49 AM   I saw and evaluated the patient, participating in the key portions of the service and reviewing pertinent diagnostic studies and records.  I reviewed the nurse practitioner's note and agree with the findings and the plan.  The assessment and plan were discussed with the patient.  A few questions were asked by the patient and answered.   Nolon Stalls, MD 04/16/2018,5:49 AM

## 2018-04-16 NOTE — Progress Notes (Signed)
[name] day:  04/16/2018  Chief Complaint: Bradley [name] is a 68 y.o. male with iron deficiency anemia on oral iron who is seen for 3 month assessment.   HPI:  The patient was last seen in the hematology clinic on 01/14/2018.  At that time, patient was doing well. Energy was "good". He had continued exertional shortness of breath. No cough. No B symptoms. He had been off of his oral B12 therapy x 3 months. Exam was unremarkable.   B12 low at 300 pg/mL on 01/14/2018. Patient was contacted and advised to restarted oral B12 supplementation. Level was rechecked on 02/11/2018, at which time it was found to be 389 pg/mL.   Patient was scheduled for a cardiac stress test on 01/26/2018, however it does not appear that the testing was performed.   Symptomatically, he feels "pretty good".  He notes a head cold.  He has had a cough for 2 days.  He is taking his B12 every day.  He has been taking oral iron.   Past Medical History:  Diagnosis Date  . COPD (chronic obstructive pulmonary disease) ([place])   . Coronary artery disease   . Diabetes mellitus without complication ([place])   . Hypertension     Past Surgical History:  Procedure Laterality Date  . LUNG BIOPSY Right    2006  . right side partial lobe removed       Family History  Problem Relation Age of Onset  . Cancer Mother   . Heart attack Father   . Cancer Sister     Social History:  reports that he quit smoking about 21 months ago. He has a 52.00 pack-year smoking history. He has never used smokeless tobacco. He reports that he does not drink alcohol or use drugs.  He smoked 1 pack/day since age 37 until 9 weeks ago.  He quit smoking on 06/18/2016 at 10 AM.  He denies any exposure to radiation or toxins.  He is retired from working for the [employer].  His wife died 24 months ago with "cancer in the blood" (? leukemia/lymphoma).  The patient is alone today.  Allergies: No Known  Allergies  Current Medications: Current Outpatient Medications  Medication Sig Dispense Refill  . aspirin EC 81 MG tablet Take 81 mg by mouth daily.    Marland Kitchen BREO ELLIPTA 100-25 MCG/INH AEPB INHALE 1 PUFF BY MOUTH ONCE DAILY  11  . clopidogrel (PLAVIX) 75 MG tablet Take 75 mg by mouth daily.    . enalapril (VASOTEC) 10 MG tablet Take 10 mg by mouth daily.    . ferrous sulfate 325 (65 FE) MG EC tablet Take 1 tablet (325 mg total) by mouth daily. 30 tablet 3  . Fluticasone-Salmeterol (ADVAIR) 250-50 MCG/DOSE AEPB Inhale 1 puff into the lungs 2 (two) times daily. 60 each 0  . metFORMIN (GLUCOPHAGE) 500 MG tablet Take 1 tablet (500 mg total) by mouth 2 (two) times daily with a meal. Takes every morning and then takes another one in the evening if his "sugar starts acting up" (Patient taking differently: Take 500 mg by mouth daily. Takes every morning and then takes another one in the evening if his "sugar starts acting up") 60 tablet 0  . metoprolol (LOPRESSOR) 50 MG tablet Take 50 mg by mouth 2 (two) times daily.    Marland Kitchen PROAIR HFA 108 (90 Base) MCG/ACT inhaler Inhale 2 puffs into the lungs every 6 (six) hours as  needed for wheezing or shortness of breath.    . simvastatin (ZOCOR) 20 MG tablet Take 20 mg by mouth at bedtime.     Marland Kitchen tiotropium (SPIRIVA) 18 MCG inhalation capsule Place 1 capsule (18 mcg total) into inhaler and inhale daily. 30 capsule 0  . albuterol (PROVENTIL) (2.5 MG/3ML) 0.083% nebulizer solution Take 2.5 mg by nebulization every 6 (six) hours as needed for wheezing or shortness of breath.    Marland Kitchen glucose blood ([employer] TEST STRIPS) test strip Use once daily. Use as instructed. DX: E11.9    . nitroGLYCERIN (NITROSTAT) 0.4 MG SL tablet Place 0.4 mg under the tongue every 5 (five) minutes as needed for chest pain.    . sildenafil (REVATIO) 20 MG tablet Take 20 mg by mouth daily as needed.     No current facility-administered medications for this visit.     Review of Systems  Constitutional:  Negative.  Negative for chills, diaphoresis, fever, malaise/fatigue and weight loss (up 5 pounds).       Feels "pretty good".  HENT: Positive for congestion. Negative for ear discharge, ear pain, nosebleeds, sinus pain, sore throat and tinnitus.        Head cold.  Eyes: Negative.  Negative for blurred vision, double vision and photophobia.  Respiratory: Positive for cough (x 2 days) and shortness of breath (exertional). Negative for hemoptysis, sputum production and wheezing.   Cardiovascular: Negative.  Negative for chest pain, palpitations, orthopnea, leg swelling and PND.  Gastrointestinal: Negative.  Negative for abdominal pain, blood in stool, constipation, diarrhea, heartburn, melena and vomiting.  Genitourinary: Negative.  Negative for dysuria, frequency, hematuria and urgency.  Musculoskeletal: Negative.  Negative for back pain, falls, joint pain and neck pain.       "Hip and back good".  Skin: Negative.  Negative for itching and rash.  Neurological: Negative.  Negative for dizziness, tremors, sensory change, speech change, focal weakness, weakness and headaches.  Endo/Heme/Allergies: Negative.  Does not bruise/bleed easily.  Psychiatric/Behavioral: Negative.  Negative for depression and memory loss. The patient is not nervous/anxious and does not have insomnia.   All other systems reviewed and are negative.  Performance status (ECOG): 1  Vital Signs BP 109/71 (BP Location: Left Arm, Patient Position: Sitting)   Pulse 85   Temp (!) 97.4 F (36.3 C) (Tympanic)   Resp 18   Ht 5\' 7"  (1.702 m)   Wt 177 lb 9.3 oz (80.6 kg)   SpO2 95%   BMI 27.81 kg/m   Physical Exam  Constitutional: He is oriented to person, place, and time and well-developed, well-nourished, and in no distress. No distress.  HENT:  Head: Normocephalic and atraumatic.  Right Ear: External ear normal.  Mouth/Throat: Oropharynx is clear and moist. No oropharyngeal exudate.  Wearing a camo cap.  Gray hair with  goatee.  Male pattern baldness.  Eyes: Pupils are equal, round, and reactive to light. Conjunctivae and EOM are normal. No scleral icterus.  Blue eyes.   Neck: Normal range of motion. Neck supple. No JVD present.  Cardiovascular: Normal rate, regular rhythm and normal heart sounds. Exam reveals no gallop and no friction rub.  No murmur heard. Pulmonary/Chest: Effort normal and breath sounds normal. No respiratory distress. He has no wheezes. He has no rales.  Abdominal: Soft. Bowel sounds are normal. He exhibits no distension and no mass. There is abdominal tenderness (slight epigastric area). There is no rebound and no guarding.  Fully round.  Musculoskeletal: Normal range of motion.  General: No tenderness or edema.  Lymphadenopathy:    He has no cervical adenopathy.  Neurological: He is alert and oriented to person, place, and time. Gait normal.  Skin: Skin is warm and dry. No rash noted. He is not diaphoretic. No erythema. No pallor.  Psychiatric: Mood, affect and judgment normal.  Nursing note and vitals reviewed.   Appointment on 04/16/2018  Component Date Value Ref Range Status  . Sodium 04/16/2018 135  135 - 145 mmol/L Final  . Potassium 04/16/2018 4.1  3.5 - 5.1 mmol/L Final  . Chloride 04/16/2018 100  98 - 111 mmol/L Final  . CO2 04/16/2018 26  22 - 32 mmol/L Final  . Glucose, Bld 04/16/2018 125* 70 - 99 mg/dL Final  . BUN 04/16/2018 13  8 - 23 mg/dL Final  . Creatinine, Ser 04/16/2018 0.69  0.61 - 1.24 mg/dL Final  . Calcium 04/16/2018 8.7* 8.9 - 10.3 mg/dL Final  . Total Protein 04/16/2018 7.2  6.5 - 8.1 g/dL Final  . Albumin 04/16/2018 3.8  3.5 - 5.0 g/dL Final  . AST 04/16/2018 23  15 - 41 U/L Final  . ALT 04/16/2018 22  0 - 44 U/L Final  . Alkaline Phosphatase 04/16/2018 73  38 - 126 U/L Final  . Total Bilirubin 04/16/2018 0.8  0.3 - 1.2 mg/dL Final  . GFR calc non Af Amer 04/16/2018 >60  >60 mL/min Final  . GFR calc Af Amer 04/16/2018 >60  >60 mL/min Final   . Anion gap 04/16/2018 9  5 - 15 Final   Performed at [facility], [address], [place], [place] [address]  . WBC 04/16/2018 8.7  4.0 - 10.5 K/uL Final  . RBC 04/16/2018 4.81  4.22 - 5.81 MIL/uL Final  . Hemoglobin 04/16/2018 14.9  13.0 - 17.0 g/dL Final  . HCT 04/16/2018 44.4  39.0 - 52.0 % Final  . MCV 04/16/2018 92.3  80.0 - 100.0 fL Final  . MCH 04/16/2018 31.0  26.0 - 34.0 pg Final  . MCHC 04/16/2018 33.6  30.0 - 36.0 g/dL Final  . RDW 04/16/2018 13.7  11.5 - 15.5 % Final  . Platelets 04/16/2018 268  150 - 400 K/uL Final  . nRBC 04/16/2018 0.0  0.0 - 0.2 % Final  . Neutrophils Relative % 04/16/2018 60  % Final  . Neutro Abs 04/16/2018 5.2  1.7 - 7.7 K/uL Final  . Lymphocytes Relative 04/16/2018 21  % Final  . Lymphs Abs 04/16/2018 1.8  0.7 - 4.0 K/uL Final  . Monocytes Relative 04/16/2018 15  % Final  . Monocytes Absolute 04/16/2018 1.3* 0.1 - 1.0 K/uL Final  . Eosinophils Relative 04/16/2018 2  % Final  . Eosinophils Absolute 04/16/2018 0.2  0.0 - 0.5 K/uL Final  . Basophils Relative 04/16/2018 1  % Final  . Basophils Absolute 04/16/2018 0.1  0.0 - 0.1 K/uL Final  . Immature Granulocytes 04/16/2018 1  % Final  . Abs Immature Granulocytes 04/16/2018 0.05  0.00 - 0.07 K/uL Final   Performed at [facility], [address] [place], [place] [address]    Assessment:  ELMON SHADER is a 68 y.o. male with mild anemia with progressive microcytic indices over the past 2 years.  Diet is good.  He denies any melena, hematochezia or hematuria.  Colonoscopy on 07/01/2012 revealed diverticulosis.  He believes he had an EGD 10 years ago.  Work-up on 08/07/2016 revealed a hematocrit of 41.5, hemoglobin 13.7, MCV 81.9, platelets  337,000, white count 9300 with an [place] of 5800.  Normal studies included:  SPEP, folate, iron saturation (24%), TIBC (399), sed rate, retic (1.3%), TSH.  He has mild hypogammaglobulinemia with an IgG of 601 (700 - 1600).  IgG was 562 on  08/03/2014.  Ferritin was 16 (low).  B12 was 319 (low normal) with a normal MMA r/o B12 deficiency.  Free light chains were normal. 24 hour UPEP revealed 112 mg/24 hours (30-150).  Immunofixation was normal.  Urinalysis revealed trace hemoglobin.  One month prior urine hemoglobin was small.  Guaiac cards were negative x 3. Patient currently on ferrous sulfate.   Ferritin has been followed: 16 on 08/07/2016, 30 on 11/19/2016, 37 on 03/19/2017, 23 on 06/18/2017, 31 on 10/08/2017, 62 on 01/14/2018, and 50 on 04/16/2018.  He has mild vitamin B12 deficiency.  B12 was 300 on 01/14/2018.  He is on oral B12.  Folate 11.9 on 01/14/2018.  He has been hospitalized 2 times/year in the past 3 years for pneumonia. He has COPD.  He denies any other infections. IgG was 711 (normal) on 03/19/2017.  PET scan at [facility] on 07/12/2016 revealed postsurgical changes status post partial lower lobe resection.  There was heterogeneous opacity at the resection site favoring to represent atelectasis with  possible superimposed inflammatory/infectious process. There were other small nodules within the right lower lobe demonstrating minimal activity and stable.  Low dose chest CT on 09/03/2016 demonstrated small mediastinal lymph nodes that did not meet pathologic CT size criteria. There were scattered bilateral pulmonary nodules measuring up to 5.3 mm noted in the RIGHT middle lobe. There was a 6.3 mm calcified granuloma in the posterior LEFT upper lobe.   Low dose chest CT on 09/12/2017 revealed Lung-RADS 2, benign appearance or behavior. There was scattered bilateral pulmonary nodules (up to 5.2 mm).  There was a stable 1.1 cm nodular opacity in the lateral RLL (favored RML).  Continue annual screening with low-dose chest CT without contrast in 12 months was recommended.  Symptomatically, he is doing well.  Exam is unremarkable.  Hemoglobin is 14.9.  Plan: 1. Labs today:  CBC with diff, ferritin, B12, folate. 2.   Iron  deficiency anemia Hemoglobin 14.9, hematocrit 44.4, MCV 92.3 Ferritin 50.  Goal 100. Continue oral iron with vitamin C. 3.   Hemoglobinuria Hemaglobinuria work-up was  negative. 4.   B12 deficiency B12 level was 300 on 01/14/2018.  Goal 400. Patient taking B12. B12 1483 today. Folate 11.9 on 01/14/2018. 5.   RTC in 3 months for labs (CBC with diff, ferritin). 6.   RTC in 6 months for MD assessment and labs (CBC with diff, CMP, ferritin).   Honor Loh, NP  04/16/2018, 3:54 PM   I saw and evaluated the patient, participating in the key portions of the service and reviewing pertinent diagnostic studies and records.  I reviewed the nurse practitioner's note and agree with the findings and the plan.  The assessment and plan were discussed with the patient.  A few questions were asked by the patient and answered.   Nolon Stalls, MD 04/16/2018,3:54 PM

## 2018-04-20 ENCOUNTER — Other Ambulatory Visit: Payer: PPO

## 2018-04-20 ENCOUNTER — Ambulatory Visit: Payer: PPO | Admitting: Hematology and Oncology

## 2018-04-28 ENCOUNTER — Other Ambulatory Visit: Payer: Self-pay | Admitting: Pharmacist

## 2018-04-28 ENCOUNTER — Other Ambulatory Visit: Payer: Self-pay | Admitting: Pharmacy Technician

## 2018-04-28 NOTE — Patient Outreach (Signed)
[place] [facility]) Care Management  04/28/2018  Bradley [name] [birth year] [id number]    Successful call placed to patients daughter, [name] regarding patient assistance medication receipt from [employer], HIPAA identifiers verified. [name] states that medication has not arrived as of yet and that her dad has not run out of meds.   Incoming message from [name] stating meds came in the mail today. Reviewed with her how to obtain refills thru [employer] and [employer]) and requested she contact me if they run into any issues in the future.   Will route note to Ko Olina for case closure.  Maud Deed Chana Bode [place] Certified Pharmacy Technician [place] Management Direct Dial:[phone]

## 2018-04-28 NOTE — Patient Outreach (Signed)
[place] [place] [facility]) Care Management [place]  04/28/2018  CLEMENTS TORO 1950/09/05 [id number]  Reason for referral: medication assistance/adherence  [facility] pharmacy case is being closed due to the following reasons:  Goals have been met.  Medication assistance for inhalers obtained.  No additional pharmacy needs identified.  Patient has been provided [facility] CM contact information if assistance needed in the future.    Thank you for allowing [facility] pharmacy to be involved in this patient's care.    Regina Eck, PharmD, [place]  [phone]

## 2018-05-06 DIAGNOSIS — J439 Emphysema, unspecified: Secondary | ICD-10-CM | POA: Diagnosis not present

## 2018-05-06 DIAGNOSIS — I2510 Atherosclerotic heart disease of native coronary artery without angina pectoris: Secondary | ICD-10-CM | POA: Diagnosis not present

## 2018-05-06 DIAGNOSIS — G4733 Obstructive sleep apnea (adult) (pediatric): Secondary | ICD-10-CM | POA: Diagnosis not present

## 2018-05-06 DIAGNOSIS — G4730 Sleep apnea, unspecified: Secondary | ICD-10-CM | POA: Diagnosis not present

## 2018-05-06 DIAGNOSIS — I10 Essential (primary) hypertension: Secondary | ICD-10-CM | POA: Diagnosis not present

## 2018-05-06 DIAGNOSIS — I6522 Occlusion and stenosis of left carotid artery: Secondary | ICD-10-CM | POA: Diagnosis not present

## 2018-05-21 DIAGNOSIS — I2510 Atherosclerotic heart disease of native coronary artery without angina pectoris: Secondary | ICD-10-CM | POA: Diagnosis not present

## 2018-05-21 DIAGNOSIS — E782 Mixed hyperlipidemia: Secondary | ICD-10-CM | POA: Diagnosis not present

## 2018-05-21 DIAGNOSIS — I10 Essential (primary) hypertension: Secondary | ICD-10-CM | POA: Diagnosis not present

## 2018-05-21 DIAGNOSIS — E119 Type 2 diabetes mellitus without complications: Secondary | ICD-10-CM | POA: Diagnosis not present

## 2018-05-21 DIAGNOSIS — J441 Chronic obstructive pulmonary disease with (acute) exacerbation: Secondary | ICD-10-CM | POA: Diagnosis not present

## 2018-07-13 ENCOUNTER — Other Ambulatory Visit: Payer: Self-pay | Admitting: *Deleted

## 2018-07-13 NOTE — Patient Outreach (Signed)
[place] [facility]) Care Management  07/13/2018  FLORENTINO LAABS 12/05/1950 [id number]  RN Health Coach attempted follow up outreach call to patient.  Patient was unavailable. HIPPA compliance voicemail message left with return callback number.  Plan: RN will call patient again within 30 days.  [place] Care Management [phone]

## 2018-07-15 ENCOUNTER — Other Ambulatory Visit: Payer: Self-pay

## 2018-07-16 ENCOUNTER — Inpatient Hospital Stay: Payer: PPO | Attending: Hematology and Oncology

## 2018-07-16 DIAGNOSIS — E538 Deficiency of other specified B group vitamins: Secondary | ICD-10-CM

## 2018-07-16 DIAGNOSIS — D509 Iron deficiency anemia, unspecified: Secondary | ICD-10-CM | POA: Insufficient documentation

## 2018-07-16 LAB — CBC WITH DIFFERENTIAL/PLATELET
Abs Immature Granulocytes: 0.05 10*3/uL (ref 0.00–0.07)
Basophils Absolute: 0.1 10*3/uL (ref 0.0–0.1)
Basophils Relative: 1 %
Eosinophils Absolute: 0.2 10*3/uL (ref 0.0–0.5)
Eosinophils Relative: 3 %
HCT: 44.8 % (ref 39.0–52.0)
Hemoglobin: 14.9 g/dL (ref 13.0–17.0)
Immature Granulocytes: 1 %
Lymphocytes Relative: 23 %
Lymphs Abs: 1.9 10*3/uL (ref 0.7–4.0)
MCH: 30.2 pg (ref 26.0–34.0)
MCHC: 33.3 g/dL (ref 30.0–36.0)
MCV: 90.7 fL (ref 80.0–100.0)
Monocytes Absolute: 1.1 10*3/uL — ABNORMAL HIGH (ref 0.1–1.0)
Monocytes Relative: 13 %
Neutro Abs: 5.1 10*3/uL (ref 1.7–7.7)
Neutrophils Relative %: 59 %
Platelets: 281 10*3/uL (ref 150–400)
RBC: 4.94 MIL/uL (ref 4.22–5.81)
RDW: 14.6 % (ref 11.5–15.5)
WBC: 8.4 10*3/uL (ref 4.0–10.5)
nRBC: 0 % (ref 0.0–0.2)

## 2018-07-16 LAB — FERRITIN: Ferritin: 41 ng/mL (ref 24–336)

## 2018-07-23 ENCOUNTER — Other Ambulatory Visit: Payer: Self-pay | Admitting: Pharmacy Technician

## 2018-07-23 NOTE — Patient Outreach (Signed)
[place] [facility]) Care Management  07/23/2018  Bradley [name] 16-Oct-1950 [id number]   Incoming message from patients daughter. [name] stating she was having issues requesting her dads refills for [facility] and Proventil HFA.  Contacted [facility] and spoke to [place] who put in the request for Dulera and Proventil. She states medication should arrive in 7-10 business days.  Informed Lori.  Maud Deed Chana Bode [place] Certified Pharmacy Technician [place] Management Direct Dial:[phone]

## 2018-08-05 ENCOUNTER — Encounter: Payer: Self-pay | Admitting: *Deleted

## 2018-08-05 NOTE — Telephone Encounter (Signed)
This encounter was created in error - please disregard.

## 2018-08-05 NOTE — Patient Outreach (Signed)
[place] [facility]) Care Management 07/13/2018 Late entry  Bradley [name] 12/14/1950 [id number]  [place] received return telephone call from patient.  Hipaa compliance verified. Per patient he is doing pretty good. Patient stated that he is wearing masks when he goes out. Patient stated he has been taking his medications as prescribed. Patient has not had any recent falls. Patient has agrees to further out reach calls.  Current Medications:  Current Outpatient Medications  Medication Sig Dispense Refill  . albuterol (PROVENTIL) (2.5 MG/3ML) 0.083% nebulizer solution Take 2.5 mg by nebulization every 6 (six) hours as needed for wheezing or shortness of breath.    Marland Kitchen aspirin EC 81 MG tablet Take 81 mg by mouth daily.    Marland Kitchen BREO ELLIPTA 100-25 MCG/INH AEPB INHALE 1 PUFF BY MOUTH ONCE DAILY  11  . clopidogrel (PLAVIX) 75 MG tablet Take 75 mg by mouth daily.    . enalapril (VASOTEC) 10 MG tablet Take 10 mg by mouth daily.    . ferrous sulfate 325 (65 FE) MG EC tablet Take 1 tablet (325 mg total) by mouth daily. 30 tablet 3  . Fluticasone-Salmeterol (ADVAIR) 250-50 MCG/DOSE AEPB Inhale 1 puff into the lungs 2 (two) times daily. 60 each 0  . glucose blood ([employer] TEST STRIPS) test strip Use once daily. Use as instructed. DX: E11.9    . metFORMIN (GLUCOPHAGE) 500 MG tablet Take 1 tablet (500 mg total) by mouth 2 (two) times daily with a meal. Takes every morning and then takes another one in the evening if his "sugar starts acting up" (Patient taking differently: Take 500 mg by mouth daily. Takes every morning and then takes another one in the evening if his "sugar starts acting up") 60 tablet 0  . metoprolol (LOPRESSOR) 50 MG tablet Take 50 mg by mouth 2 (two) times daily.    . nitroGLYCERIN (NITROSTAT) 0.4 MG SL tablet Place 0.4 mg under the tongue every 5 (five) minutes as needed for chest pain.    Marland Kitchen PROAIR HFA 108 (90 Base) MCG/ACT inhaler Inhale 2 puffs into the lungs every 6 (six)  hours as needed for wheezing or shortness of breath.    . sildenafil (REVATIO) 20 MG tablet Take 20 mg by mouth daily as needed.    . simvastatin (ZOCOR) 20 MG tablet Take 20 mg by mouth at bedtime.     Marland Kitchen tiotropium (SPIRIVA) 18 MCG inhalation capsule Place 1 capsule (18 mcg total) into inhaler and inhale daily. 30 capsule 0   No current facility-administered medications for this visit.     Functional Status:  In your present state of health, do you have any difficulty performing the following activities: 07/13/2018 04/10/2018  Hearing? N N  Vision? N N  Difficulty concentrating or making decisions? N N  Walking or climbing stairs? Y Y  Dressing or bathing? Y Y  Doing errands, shopping? N N  Preparing Food and eating ? N N  Using the Toilet? N N  In the past six months, have you accidently leaked urine? N N  Do you have problems with loss of bowel control? N N  Managing your Medications? N N  Managing your Finances? N N  Housekeeping or managing your Housekeeping? N N  Some recent data might be hidden    Fall/Depression Screening: Fall Risk  07/13/2018 04/10/2018 10/06/2017  Falls in the past year? 0 0 No   PHQ 2/9 Scores 07/13/2018 04/10/2018 10/06/2017 09/02/2017 08/28/2017 01/08/2016  PHQ - 2  Score 0 0 0 0 0 0   TH[facility] CM Care Plan Problem One     Most Recent Value  Care Plan Problem One  Knowledge Deficit in Self Management of COPD  Role Documenting the Problem One  [place] for Problem One  Active  THN Long Term Goal   Patient will not be readmitted for COPD within the next 90 days  THN Long Term Goal Start Date  07/13/18  Interventions for Problem One Long Term Goal  RN reiterates the zones and action plan of COPD. RN rdiscussed the Coronavirus safety precautions. RN willfollow upwith further discussion  [facility] CM Short Term Goal #1   Patient will verbalize weighing daily for the next 30 days and documenting and taking to physician  [facility] CM Short Term Goal #1 Start Date   07/13/18  Interventions for Short Term Goal #1  RN reiterates monitoring weight gain for fluid. RN will follow up with further discussion  THN CM Short Term Goal #2   Patient will write down questions for next doctor's appointment within the next 30 days  [facility] CM Short Term Goal #2 Start Date  07/13/18  Interventions for Short Term Goal #2  RN discussed rescheduling Dr appointments that had been canceeled due to coronovirus. RN  discussed giving the Dr the questions he had written down. RN will follow up for compliance.       Assessment: Patient is in green zone Patient is using coronavirus safety precautions  Patient has adequate medication during pandemic Patient has adequate food supply during the pandemic  Plan:  RN discussed COPD exacerbation  RN discussed pandemic safety precautions RN assessed food and medication needs during pandemic RN will follow up within the month of August  Blakely Gluth [place] Management [phone]

## 2018-08-10 DIAGNOSIS — R0600 Dyspnea, unspecified: Secondary | ICD-10-CM | POA: Diagnosis not present

## 2018-08-10 DIAGNOSIS — I10 Essential (primary) hypertension: Secondary | ICD-10-CM | POA: Diagnosis not present

## 2018-08-10 DIAGNOSIS — E119 Type 2 diabetes mellitus without complications: Secondary | ICD-10-CM | POA: Diagnosis not present

## 2018-08-10 DIAGNOSIS — J438 Other emphysema: Secondary | ICD-10-CM | POA: Diagnosis not present

## 2018-08-10 DIAGNOSIS — E782 Mixed hyperlipidemia: Secondary | ICD-10-CM | POA: Diagnosis not present

## 2018-08-10 DIAGNOSIS — R600 Localized edema: Secondary | ICD-10-CM | POA: Diagnosis not present

## 2018-08-11 DIAGNOSIS — J439 Emphysema, unspecified: Secondary | ICD-10-CM | POA: Diagnosis not present

## 2018-08-11 DIAGNOSIS — R911 Solitary pulmonary nodule: Secondary | ICD-10-CM | POA: Diagnosis not present

## 2018-08-11 DIAGNOSIS — Z122 Encounter for screening for malignant neoplasm of respiratory organs: Secondary | ICD-10-CM | POA: Diagnosis not present

## 2018-08-11 DIAGNOSIS — R0602 Shortness of breath: Secondary | ICD-10-CM | POA: Diagnosis not present

## 2018-08-11 DIAGNOSIS — Z9981 Dependence on supplemental oxygen: Secondary | ICD-10-CM | POA: Diagnosis not present

## 2018-08-11 DIAGNOSIS — I2510 Atherosclerotic heart disease of native coronary artery without angina pectoris: Secondary | ICD-10-CM | POA: Diagnosis not present

## 2018-08-11 DIAGNOSIS — J449 Chronic obstructive pulmonary disease, unspecified: Secondary | ICD-10-CM | POA: Diagnosis not present

## 2018-08-11 DIAGNOSIS — R0609 Other forms of dyspnea: Secondary | ICD-10-CM | POA: Diagnosis not present

## 2018-08-11 DIAGNOSIS — Z87891 Personal history of nicotine dependence: Secondary | ICD-10-CM | POA: Diagnosis not present

## 2018-08-21 ENCOUNTER — Other Ambulatory Visit: Payer: Self-pay | Admitting: *Deleted

## 2018-08-21 NOTE — Patient Outreach (Signed)
[place] [facility]) Care Management  08/21/2018  YOGI ARTHER 1951-02-03 [id number]   RN Health Coach is closing this program. Consumer is enrolled in [facility] external program.  [place] Care Management [phone]

## 2018-08-24 DIAGNOSIS — E119 Type 2 diabetes mellitus without complications: Secondary | ICD-10-CM | POA: Diagnosis not present

## 2018-08-24 DIAGNOSIS — I10 Essential (primary) hypertension: Secondary | ICD-10-CM | POA: Diagnosis not present

## 2018-08-24 DIAGNOSIS — Z125 Encounter for screening for malignant neoplasm of prostate: Secondary | ICD-10-CM | POA: Diagnosis not present

## 2018-08-24 DIAGNOSIS — Z0000 Encounter for general adult medical examination without abnormal findings: Secondary | ICD-10-CM | POA: Diagnosis not present

## 2018-08-24 DIAGNOSIS — E782 Mixed hyperlipidemia: Secondary | ICD-10-CM | POA: Diagnosis not present

## 2018-09-09 ENCOUNTER — Ambulatory Visit: Payer: PPO | Admitting: *Deleted

## 2018-09-14 ENCOUNTER — Telehealth: Payer: Self-pay | Admitting: *Deleted

## 2018-09-14 NOTE — Telephone Encounter (Signed)
Patient had Lung Screening CT at [facility] on 08/11/2018, results in [place].   He states that  He is still currently a non-smoker quitting approximately 2 years ago.

## 2018-09-22 DIAGNOSIS — J439 Emphysema, unspecified: Secondary | ICD-10-CM | POA: Diagnosis not present

## 2018-09-22 DIAGNOSIS — G4733 Obstructive sleep apnea (adult) (pediatric): Secondary | ICD-10-CM | POA: Diagnosis not present

## 2018-09-22 DIAGNOSIS — R0600 Dyspnea, unspecified: Secondary | ICD-10-CM | POA: Diagnosis not present

## 2018-09-22 DIAGNOSIS — R0902 Hypoxemia: Secondary | ICD-10-CM | POA: Diagnosis not present

## 2018-10-13 NOTE — Progress Notes (Signed)
[facility]  [address], [address] [place], [place] [address] Phone: [phone]  Fax: [phone]   Clinic Day:  10/15/2018  Referring physician: Valera [place], *  Chief Complaint: Bradley [name] is a 68 y.o. male with iron deficiency anemia on oral iron who is seen for 6 month assessment.  HPI: The patient was last seen in the hematology clinic on 04/16/2018. At that time, he was doing well.  Exam was unremarkable. Hemoglobin was 14.9.  Ferritin was 50.  He continued oral iron with vitamin C.   Labs on 07/16/2018: Hematocrit 44.8, hemoglobin 14.9, MCV 90.7, platelets 281,000, WBC 8,400.  Ferritin 41.   During the interim, the patient feels "fine". He notes using oxygen prn (humid and temperature > 90 degrees). He denies the use of herbal products. He has not used ibuprofen in 1 week. He drinks wine occasionally; he cut back on coffee and drinks more water. Since he quit smoking, his taste buds have changed, and he likes chocolate milk.  He normally goes to the bathroom twice a night, and he notes his urine has some foam in it. He has intermittent abdominal pain in the epigastric region x 2 months. He notes numbness on the right side of his ribs.   He stopped oral iron x 1 month ago. He is still taking his vitamin B-12.   Past Medical History:  Diagnosis Date  . COPD (chronic obstructive pulmonary disease) ([place])   . Coronary artery disease   . Diabetes mellitus without complication ([place])   . Hypertension     Past Surgical History:  Procedure Laterality Date  . LUNG BIOPSY Right    2006  . right side partial lobe removed       Family History  Problem Relation Age of Onset  . Cancer Mother   . Heart attack Father   . Cancer Sister     Social History:  reports that he quit smoking about 2 years ago. He has a 52.00 pack-year smoking history. He has never used smokeless tobacco. He reports that he does not drink alcohol or use drugs.  He  smoked 1 pack/day since age 70 until 9 weeks ago.  He quit smoking on 06/18/2016 at 10 AM.  He denies any exposure to radiation or toxins.  He is retired from working for the [employer].  His wife died 39 months ago with "cancer in the blood" (? leukemia/lymphoma). The patient is alone today.  Allergies: No Known Allergies  Current Medications: Current Outpatient Medications  Medication Sig Dispense Refill  . albuterol (PROVENTIL) (2.5 MG/3ML) 0.083% nebulizer solution Take 2.5 mg by nebulization every 6 (six) hours as needed for wheezing or shortness of breath.    Marland Kitchen aspirin EC 81 MG tablet Take 81 mg by mouth daily.    Marland Kitchen BREO ELLIPTA 100-25 MCG/INH AEPB INHALE 1 PUFF BY MOUTH ONCE DAILY  11  . clopidogrel (PLAVIX) 75 MG tablet Take 75 mg by mouth daily.    . enalapril (VASOTEC) 10 MG tablet Take 10 mg by mouth daily.    . ferrous sulfate 325 (65 FE) MG EC tablet Take 1 tablet (325 mg total) by mouth daily. 30 tablet 3  . Fluticasone-Salmeterol (ADVAIR) 250-50 MCG/DOSE AEPB Inhale 1 puff into the lungs 2 (two) times daily. 60 each 0  . glucose blood ([employer] TEST STRIPS) test strip Use once daily. Use as instructed. DX: E11.9    . metFORMIN (GLUCOPHAGE) 500 MG tablet Take  1 tablet (500 mg total) by mouth 2 (two) times daily with a meal. Takes every morning and then takes another one in the evening if his "sugar starts acting up" (Patient taking differently: Take 500 mg by mouth daily. Takes every morning and then takes another one in the evening if his "sugar starts acting up") 60 tablet 0  . metoprolol (LOPRESSOR) 50 MG tablet Take 50 mg by mouth 2 (two) times daily.    . sildenafil (REVATIO) 20 MG tablet Take 20 mg by mouth daily as needed.    . simvastatin (ZOCOR) 20 MG tablet Take 20 mg by mouth at bedtime.     Marland Kitchen tiotropium (SPIRIVA) 18 MCG inhalation capsule Place 1 capsule (18 mcg total) into inhaler and inhale daily. 30 capsule 0  . nitroGLYCERIN (NITROSTAT) 0.4 MG SL tablet Place 0.4  mg under the tongue every 5 (five) minutes as needed for chest pain.    Marland Kitchen PROAIR HFA 108 (90 Base) MCG/ACT inhaler Inhale 2 puffs into the lungs every 6 (six) hours as needed for wheezing or shortness of breath.     No current facility-administered medications for this visit.     Review of Systems  Constitutional: Negative.  Negative for chills, diaphoresis, fever, malaise/fatigue and weight loss (stable).       Feels "fine".  HENT: Negative for congestion, ear discharge, ear pain, nosebleeds, sinus pain, sore throat and tinnitus.   Eyes: Negative.  Negative for blurred vision, double vision and photophobia.  Respiratory: Negative for cough, hemoptysis, sputum production, shortness of breath and wheezing.   Cardiovascular: Negative.  Negative for chest pain, palpitations, orthopnea, leg swelling and PND.  Gastrointestinal: Positive for abdominal pain (epigastric x 2 months). Negative for blood in stool, constipation, diarrhea, heartburn, melena and vomiting.  Genitourinary: Negative.  Negative for dysuria, frequency, hematuria and urgency.       Foam in urine.  Musculoskeletal: Negative.  Negative for back pain, falls, joint pain and neck pain.       Numbness on the right side of his ribs.  Skin: Negative.  Negative for itching and rash.  Neurological: Negative.  Negative for dizziness, tremors, sensory change, speech change, focal weakness, weakness and headaches.  Endo/Heme/Allergies: Negative.  Does not bruise/bleed easily.  Psychiatric/Behavioral: Negative.  Negative for depression and memory loss. The patient is not nervous/anxious and does not have insomnia.   All other systems reviewed and are negative.  Performance status (ECOG): 1  Vitals Blood pressure 103/75, pulse 70, temperature (!) 97.5 F (36.4 C), temperature source Tympanic, resp. rate 18, height 5\' 7"  (1.702 m), weight 178 lb 10.9 oz (81.1 kg), SpO2 94 %.  Physical Exam  Constitutional: He is oriented to person,  place, and time. He appears well-developed and well-nourished. No distress.  HENT:  Head: Normocephalic and atraumatic.  Mouth/Throat: Oropharynx is clear and moist. No oropharyngeal exudate.  Wearing a cap and mask. Gray hair with goatee.  Male pattern baldness.   Eyes: Pupils are equal, round, and reactive to light. Conjunctivae and EOM are normal. No scleral icterus.  Blue eyes.  Neck: Normal range of motion. Neck supple. No JVD present.  Cardiovascular: Normal rate, regular rhythm and normal heart sounds.  No murmur heard. Pulmonary/Chest: Effort normal and breath sounds normal. No respiratory distress. He has no wheezes. He has no rales. He exhibits no tenderness.  Abdominal: Soft. Bowel sounds are normal. He exhibits no distension and no mass. There is abdominal tenderness (mild) in the epigastric area. There  is no rebound and no guarding.  Fully round.  Musculoskeletal: Normal range of motion.        General: No tenderness or edema.  Lymphadenopathy:    He has no cervical adenopathy.    He has no axillary adenopathy.       Right: No supraclavicular adenopathy present.       Left: No supraclavicular adenopathy present.  Neurological: He is alert and oriented to person, place, and time.  Skin: Skin is warm and dry. No rash noted. He is not diaphoretic. No erythema. No pallor.  No rash overlying ribs.  Psychiatric: He has a normal mood and affect. His behavior is normal. Judgment and thought content normal.  Nursing note and vitals reviewed.   Appointment on 10/15/2018  Component Date Value Ref Range Status  . Sodium 10/15/2018 135  135 - 145 mmol/L Final  . Potassium 10/15/2018 4.1  3.5 - 5.1 mmol/L Final  . Chloride 10/15/2018 103  98 - 111 mmol/L Final  . CO2 10/15/2018 23  22 - 32 mmol/L Final  . Glucose, Bld 10/15/2018 216* 70 - 99 mg/dL Final  . BUN 10/15/2018 18  8 - 23 mg/dL Final  . Creatinine, Ser 10/15/2018 1.26* 0.61 - 1.24 mg/dL Final  . Calcium 10/15/2018 9.0   8.9 - 10.3 mg/dL Final  . Total Protein 10/15/2018 7.2  6.5 - 8.1 g/dL Final  . Albumin 10/15/2018 3.6  3.5 - 5.0 g/dL Final  . AST 10/15/2018 22  15 - 41 U/L Final  . ALT 10/15/2018 31  0 - 44 U/L Final  . Alkaline Phosphatase 10/15/2018 80  38 - 126 U/L Final  . Total Bilirubin 10/15/2018 0.5  0.3 - 1.2 mg/dL Final  . GFR calc non Af Amer 10/15/2018 58* >60 mL/min Final  . GFR calc Af Amer 10/15/2018 >60  >60 mL/min Final  . Anion gap 10/15/2018 9  5 - 15 Final   Performed at [facility], [address], [place], [place] [address]  . WBC 10/15/2018 10.5  4.0 - 10.5 K/uL Final  . RBC 10/15/2018 4.97  4.22 - 5.81 MIL/uL Final  . Hemoglobin 10/15/2018 14.9  13.0 - 17.0 g/dL Final  . HCT 10/15/2018 45.1  39.0 - 52.0 % Final  . MCV 10/15/2018 90.7  80.0 - 100.0 fL Final  . MCH 10/15/2018 30.0  26.0 - 34.0 pg Final  . MCHC 10/15/2018 33.0  30.0 - 36.0 g/dL Final  . RDW 10/15/2018 14.5  11.5 - 15.5 % Final  . Platelets 10/15/2018 249  150 - 400 K/uL Final  . nRBC 10/15/2018 0.0  0.0 - 0.2 % Final  . Neutrophils Relative % 10/15/2018 71  % Final  . Neutro Abs 10/15/2018 7.4  1.7 - 7.7 K/uL Final  . Lymphocytes Relative 10/15/2018 19  % Final  . Lymphs Abs 10/15/2018 2.0  0.7 - 4.0 K/uL Final  . Monocytes Relative 10/15/2018 8  % Final  . Monocytes Absolute 10/15/2018 0.9  0.1 - 1.0 K/uL Final  . Eosinophils Relative 10/15/2018 1  % Final  . Eosinophils Absolute 10/15/2018 0.1  0.0 - 0.5 K/uL Final  . Basophils Relative 10/15/2018 1  % Final  . Basophils Absolute 10/15/2018 0.1  0.0 - 0.1 K/uL Final  . Immature Granulocytes 10/15/2018 0  % Final  . Abs Immature Granulocytes 10/15/2018 0.04  0.00 - 0.07 K/uL Final   Performed at [facility], [address] [place],  [address]  Assessment:  Bradley [name] is a 68 y.o. male with mild anemia with progressive microcytic indices over the past 2 years.  Diet is good.  He denies any melena, hematochezia or  hematuria.  Colonoscopy on 07/01/2012 revealed diverticulosis.  He believes he had an EGD 10 years ago.  Work-up on 08/07/2016 revealed a hematocrit of 41.5, hemoglobin 13.7, MCV 81.9, platelets 337,000, white count 9300 with an [place] of 5800.  Normal studies included:  SPEP, folate, iron saturation (24%), TIBC (399), sed rate, retic (1.3%), TSH.  He has mild hypogammaglobulinemia with an IgG of 601 (700 - 1600).  IgG was 562 on 08/03/2014.  Ferritin was 16 (low).  B12 was 319 (low normal) with a normal MMA r/o B12 deficiency.  Free light chains were normal. 24 hour UPEP revealed 112 mg/24 hours (30-150).  Immunofixation was normal.  Urinalysis revealed trace hemoglobin.  One month prior urine hemoglobin was small.  Guaiac cards were negative x 3. Patient currently on ferrous sulfate.   Ferritin has been followed: 16 on 08/07/2016, 30 on 11/19/2016, 37 on 03/19/2017, 23 on 06/18/2017, 31 on 10/08/2017, 62 on 01/14/2018, 50 on 04/16/2018, and 41 on 07/16/2018.  He has mild vitamin B12 deficiency.  B12 was 300 on 01/14/2018.  He is on oral B12.  Folate 11.9 on 01/14/2018.   B12 was 1483 on 04/16/2018.  He has been hospitalized 2 times/year in the past 3 years for pneumonia. He has COPD.  He denies any other infections. IgG was 711 (normal) on 03/19/2017.  PET scan at [facility] on 07/12/2016 revealed postsurgical changes status post partial lower lobe resection.  There was heterogeneous opacity at the resection site favoring to represent atelectasis with  possible superimposed inflammatory/infectious process. There were other small nodules within the right lower lobe demonstrating minimal activity and stable.  Low dose chest CT on 09/03/2016 demonstrated small mediastinal lymph nodes that did not meet pathologic CT size criteria. There were scattered bilateral pulmonary nodules measuring up to 5.3 mm noted in the RIGHT middle lobe. There was a 6.3 mm calcified granuloma in the posterior LEFT upper lobe.    Low dose chest CT on 09/12/2017 revealed Lung-RADS 2, benign appearance or behavior. There was scattered bilateral pulmonary nodules (up to 5.2 mm).  There was a stable 1.1 cm nodular opacity in the lateral RLL (favored RML).  Continue annual screening with low-dose chest CT without contrast in 12 months was recommended.  Symptomatically, he notes epigastric/right upper quadrant tenderness without guarding or rebound.  He notes some numbness /burning along the right lower ribs without associated rash.  Creatinine is 1.26.  Plan: 1.   Labs today:  CBC with diff, CMP, ferritin. 2.   Iron deficiency anemia Hemoglobin 14.9.  Hematocrit 45.1.  MCV 90.7 Ferritin 82.  Goal 100. Patient has been off oral iron x 1 month. 3.   B12 deficiency B12 level was 300 on 01/14/2018.  Goal 400.  Patient taking oral B12.    B12 was 1483 on 04/16/2018.  Anticipate decreasing frequency or amount of supplementation. Folate was 11.9 on 01/14/2018. 4.   Epigastric/RUQ pain  Etiology unclear.  Patient denies any precipitating events or foods.  Consider RUQ imaging.  Patient scheduled to see Dr. Kym Groom at 10:45 AM today. 5.   Increased creatinine  Creatinine 1.26 today.  Baseline creatinine 0.69 on 04/16/2018.  Patient denies any new medications/herbal products or excess use of ibuprofen.  Follow-up with Dr. Kym Groom. 6.   RTC prn.  I discussed  the assessment and treatment plan with the patient.  The patient was provided an opportunity to ask questions and all were answered.  The patient agreed with the plan and demonstrated an understanding of the instructions.  The patient was advised to call back if the symptoms worsen or if the condition fails to improve as anticipated.   Lequita Asal, MD, PhD    10/15/2018, 9:39 AM  I, Selena Batten, am acting as scribe for [employer]. Mike Gip, MD, PhD.  I, Melissa C. Mike Gip, MD, have reviewed the above documentation for accuracy and completeness, and I agree  with the above.

## 2018-10-14 ENCOUNTER — Other Ambulatory Visit: Payer: Self-pay

## 2018-10-15 ENCOUNTER — Inpatient Hospital Stay: Payer: PPO

## 2018-10-15 ENCOUNTER — Encounter: Payer: Self-pay | Admitting: Hematology and Oncology

## 2018-10-15 ENCOUNTER — Inpatient Hospital Stay: Payer: PPO | Attending: Hematology and Oncology | Admitting: Hematology and Oncology

## 2018-10-15 VITALS — BP 103/75 | HR 70 | Temp 97.5°F | Resp 18 | Ht 67.0 in | Wt 178.7 lb

## 2018-10-15 DIAGNOSIS — Z7982 Long term (current) use of aspirin: Secondary | ICD-10-CM | POA: Insufficient documentation

## 2018-10-15 DIAGNOSIS — Z79899 Other long term (current) drug therapy: Secondary | ICD-10-CM | POA: Insufficient documentation

## 2018-10-15 DIAGNOSIS — D509 Iron deficiency anemia, unspecified: Secondary | ICD-10-CM | POA: Diagnosis not present

## 2018-10-15 DIAGNOSIS — I10 Essential (primary) hypertension: Secondary | ICD-10-CM | POA: Insufficient documentation

## 2018-10-15 DIAGNOSIS — E119 Type 2 diabetes mellitus without complications: Secondary | ICD-10-CM | POA: Diagnosis not present

## 2018-10-15 DIAGNOSIS — F17210 Nicotine dependence, cigarettes, uncomplicated: Secondary | ICD-10-CM | POA: Diagnosis not present

## 2018-10-15 DIAGNOSIS — Z7984 Long term (current) use of oral hypoglycemic drugs: Secondary | ICD-10-CM | POA: Insufficient documentation

## 2018-10-15 DIAGNOSIS — R1011 Right upper quadrant pain: Secondary | ICD-10-CM | POA: Insufficient documentation

## 2018-10-15 DIAGNOSIS — J449 Chronic obstructive pulmonary disease, unspecified: Secondary | ICD-10-CM | POA: Diagnosis not present

## 2018-10-15 DIAGNOSIS — J440 Chronic obstructive pulmonary disease with acute lower respiratory infection: Secondary | ICD-10-CM | POA: Diagnosis not present

## 2018-10-15 DIAGNOSIS — E538 Deficiency of other specified B group vitamins: Secondary | ICD-10-CM | POA: Diagnosis not present

## 2018-10-15 DIAGNOSIS — R7989 Other specified abnormal findings of blood chemistry: Secondary | ICD-10-CM | POA: Diagnosis not present

## 2018-10-15 DIAGNOSIS — Z7951 Long term (current) use of inhaled steroids: Secondary | ICD-10-CM | POA: Diagnosis not present

## 2018-10-15 DIAGNOSIS — R063 Periodic breathing: Secondary | ICD-10-CM | POA: Diagnosis not present

## 2018-10-15 LAB — CBC WITH DIFFERENTIAL/PLATELET
Abs Immature Granulocytes: 0.04 10*3/uL (ref 0.00–0.07)
Basophils Absolute: 0.1 10*3/uL (ref 0.0–0.1)
Basophils Relative: 1 %
Eosinophils Absolute: 0.1 10*3/uL (ref 0.0–0.5)
Eosinophils Relative: 1 %
HCT: 45.1 % (ref 39.0–52.0)
Hemoglobin: 14.9 g/dL (ref 13.0–17.0)
Immature Granulocytes: 0 %
Lymphocytes Relative: 19 %
Lymphs Abs: 2 10*3/uL (ref 0.7–4.0)
MCH: 30 pg (ref 26.0–34.0)
MCHC: 33 g/dL (ref 30.0–36.0)
MCV: 90.7 fL (ref 80.0–100.0)
Monocytes Absolute: 0.9 10*3/uL (ref 0.1–1.0)
Monocytes Relative: 8 %
Neutro Abs: 7.4 10*3/uL (ref 1.7–7.7)
Neutrophils Relative %: 71 %
Platelets: 249 10*3/uL (ref 150–400)
RBC: 4.97 MIL/uL (ref 4.22–5.81)
RDW: 14.5 % (ref 11.5–15.5)
WBC: 10.5 10*3/uL (ref 4.0–10.5)
nRBC: 0 % (ref 0.0–0.2)

## 2018-10-15 LAB — COMPREHENSIVE METABOLIC PANEL
ALT: 31 U/L (ref 0–44)
AST: 22 U/L (ref 15–41)
Albumin: 3.6 g/dL (ref 3.5–5.0)
Alkaline Phosphatase: 80 U/L (ref 38–126)
Anion gap: 9 (ref 5–15)
BUN: 18 mg/dL (ref 8–23)
CO2: 23 mmol/L (ref 22–32)
Calcium: 9 mg/dL (ref 8.9–10.3)
Chloride: 103 mmol/L (ref 98–111)
Creatinine, Ser: 1.26 mg/dL — ABNORMAL HIGH (ref 0.61–1.24)
GFR calc Af Amer: >60 mL/min (ref >60)
GFR calc non Af Amer: 58 mL/min — ABNORMAL LOW (ref >60)
Glucose, Bld: 216 mg/dL — ABNORMAL HIGH (ref 70–99)
Potassium: 4.1 mmol/L (ref 3.5–5.1)
Sodium: 135 mmol/L (ref 135–145)
Total Bilirubin: 0.5 mg/dL (ref 0.3–1.2)
Total Protein: 7.2 g/dL (ref 6.5–8.1)

## 2018-10-15 LAB — FERRITIN: Ferritin: 82 ng/mL (ref 24–336)

## 2018-10-15 NOTE — Progress Notes (Signed)
No new changes noted today 

## 2018-10-22 DIAGNOSIS — I10 Essential (primary) hypertension: Secondary | ICD-10-CM | POA: Diagnosis not present

## 2018-10-22 DIAGNOSIS — Z0000 Encounter for general adult medical examination without abnormal findings: Secondary | ICD-10-CM | POA: Diagnosis not present

## 2018-10-23 DIAGNOSIS — J439 Emphysema, unspecified: Secondary | ICD-10-CM | POA: Diagnosis not present

## 2018-10-26 ENCOUNTER — Other Ambulatory Visit: Payer: Self-pay | Admitting: Family Medicine

## 2018-10-26 DIAGNOSIS — R1011 Right upper quadrant pain: Secondary | ICD-10-CM

## 2018-10-27 ENCOUNTER — Other Ambulatory Visit: Payer: Self-pay

## 2018-10-27 ENCOUNTER — Ambulatory Visit
Admission: RE | Admit: 2018-10-27 | Discharge: 2018-10-27 | Disposition: A | Discharge status: Home / Self Care | Payer: PPO | Source: Ambulatory Visit | Attending: Family Medicine | Admitting: Family Medicine

## 2018-10-27 DIAGNOSIS — R1011 Right upper quadrant pain: Secondary | ICD-10-CM | POA: Diagnosis not present

## 2018-11-10 DIAGNOSIS — J439 Emphysema, unspecified: Secondary | ICD-10-CM | POA: Diagnosis not present

## 2018-11-10 DIAGNOSIS — E7800 Pure hypercholesterolemia, unspecified: Secondary | ICD-10-CM | POA: Diagnosis not present

## 2018-11-10 DIAGNOSIS — R600 Localized edema: Secondary | ICD-10-CM | POA: Diagnosis not present

## 2018-11-10 DIAGNOSIS — E782 Mixed hyperlipidemia: Secondary | ICD-10-CM | POA: Diagnosis not present

## 2018-11-10 DIAGNOSIS — I2510 Atherosclerotic heart disease of native coronary artery without angina pectoris: Secondary | ICD-10-CM | POA: Diagnosis not present

## 2018-11-10 DIAGNOSIS — I10 Essential (primary) hypertension: Secondary | ICD-10-CM | POA: Diagnosis not present

## 2018-11-10 DIAGNOSIS — Z23 Encounter for immunization: Secondary | ICD-10-CM | POA: Diagnosis not present

## 2018-11-10 DIAGNOSIS — I6522 Occlusion and stenosis of left carotid artery: Secondary | ICD-10-CM | POA: Diagnosis not present

## 2018-11-10 DIAGNOSIS — Z87891 Personal history of nicotine dependence: Secondary | ICD-10-CM | POA: Diagnosis not present

## 2018-11-10 DIAGNOSIS — J441 Chronic obstructive pulmonary disease with (acute) exacerbation: Secondary | ICD-10-CM | POA: Diagnosis not present

## 2018-11-10 DIAGNOSIS — E119 Type 2 diabetes mellitus without complications: Secondary | ICD-10-CM | POA: Diagnosis not present

## 2018-11-15 DIAGNOSIS — R063 Periodic breathing: Secondary | ICD-10-CM | POA: Diagnosis not present

## 2018-11-15 DIAGNOSIS — J449 Chronic obstructive pulmonary disease, unspecified: Secondary | ICD-10-CM | POA: Diagnosis not present

## 2018-11-23 DIAGNOSIS — J439 Emphysema, unspecified: Secondary | ICD-10-CM | POA: Diagnosis not present

## 2018-12-15 DIAGNOSIS — R063 Periodic breathing: Secondary | ICD-10-CM | POA: Diagnosis not present

## 2018-12-15 DIAGNOSIS — J449 Chronic obstructive pulmonary disease, unspecified: Secondary | ICD-10-CM | POA: Diagnosis not present

## 2018-12-23 DIAGNOSIS — J439 Emphysema, unspecified: Secondary | ICD-10-CM | POA: Diagnosis not present

## 2019-01-15 DIAGNOSIS — J449 Chronic obstructive pulmonary disease, unspecified: Secondary | ICD-10-CM | POA: Diagnosis not present

## 2019-01-15 DIAGNOSIS — R063 Periodic breathing: Secondary | ICD-10-CM | POA: Diagnosis not present

## 2019-01-23 DIAGNOSIS — J439 Emphysema, unspecified: Secondary | ICD-10-CM | POA: Diagnosis not present

## 2019-02-14 DIAGNOSIS — J449 Chronic obstructive pulmonary disease, unspecified: Secondary | ICD-10-CM | POA: Diagnosis not present

## 2019-02-14 DIAGNOSIS — R063 Periodic breathing: Secondary | ICD-10-CM | POA: Diagnosis not present

## 2019-02-17 ENCOUNTER — Other Ambulatory Visit: Payer: Self-pay | Admitting: Specialist

## 2019-02-17 DIAGNOSIS — J439 Emphysema, unspecified: Secondary | ICD-10-CM | POA: Diagnosis not present

## 2019-02-17 DIAGNOSIS — R911 Solitary pulmonary nodule: Secondary | ICD-10-CM | POA: Diagnosis not present

## 2019-02-17 DIAGNOSIS — Z9981 Dependence on supplemental oxygen: Secondary | ICD-10-CM | POA: Diagnosis not present

## 2019-02-22 DIAGNOSIS — J439 Emphysema, unspecified: Secondary | ICD-10-CM | POA: Diagnosis not present

## 2019-02-24 ENCOUNTER — Other Ambulatory Visit: Payer: Self-pay

## 2019-02-24 ENCOUNTER — Ambulatory Visit
Admission: RE | Admit: 2019-02-24 | Discharge: 2019-02-24 | Disposition: A | Discharge status: Home / Self Care | Payer: PPO | Source: Ambulatory Visit | Attending: Specialist | Admitting: Specialist

## 2019-02-24 DIAGNOSIS — R918 Other nonspecific abnormal finding of lung field: Secondary | ICD-10-CM | POA: Diagnosis not present

## 2019-02-24 DIAGNOSIS — R911 Solitary pulmonary nodule: Secondary | ICD-10-CM | POA: Diagnosis not present

## 2019-03-17 DIAGNOSIS — J449 Chronic obstructive pulmonary disease, unspecified: Secondary | ICD-10-CM | POA: Diagnosis not present

## 2019-03-17 DIAGNOSIS — R063 Periodic breathing: Secondary | ICD-10-CM | POA: Diagnosis not present

## 2019-03-25 DIAGNOSIS — J439 Emphysema, unspecified: Secondary | ICD-10-CM | POA: Diagnosis not present

## 2019-03-26 DIAGNOSIS — R10811 Right upper quadrant abdominal tenderness: Secondary | ICD-10-CM | POA: Diagnosis not present

## 2019-03-26 DIAGNOSIS — R109 Unspecified abdominal pain: Secondary | ICD-10-CM | POA: Diagnosis not present

## 2019-03-26 DIAGNOSIS — R3129 Other microscopic hematuria: Secondary | ICD-10-CM | POA: Diagnosis not present

## 2019-03-26 DIAGNOSIS — I509 Heart failure, unspecified: Secondary | ICD-10-CM | POA: Diagnosis not present

## 2019-03-26 DIAGNOSIS — E119 Type 2 diabetes mellitus without complications: Secondary | ICD-10-CM | POA: Diagnosis not present

## 2019-03-26 DIAGNOSIS — I110 Hypertensive heart disease with heart failure: Secondary | ICD-10-CM | POA: Diagnosis not present

## 2019-03-26 DIAGNOSIS — Z20822 Contact with and (suspected) exposure to covid-19: Secondary | ICD-10-CM | POA: Diagnosis not present

## 2019-03-26 DIAGNOSIS — Z7984 Long term (current) use of oral hypoglycemic drugs: Secondary | ICD-10-CM | POA: Diagnosis not present

## 2019-03-26 DIAGNOSIS — E860 Dehydration: Secondary | ICD-10-CM | POA: Diagnosis not present

## 2019-03-26 DIAGNOSIS — R509 Fever, unspecified: Secondary | ICD-10-CM | POA: Diagnosis not present

## 2019-03-26 DIAGNOSIS — R911 Solitary pulmonary nodule: Secondary | ICD-10-CM | POA: Diagnosis not present

## 2019-03-26 DIAGNOSIS — R0602 Shortness of breath: Secondary | ICD-10-CM | POA: Diagnosis not present

## 2019-03-26 DIAGNOSIS — R10816 Epigastric abdominal tenderness: Secondary | ICD-10-CM | POA: Diagnosis not present

## 2019-03-26 DIAGNOSIS — R112 Nausea with vomiting, unspecified: Secondary | ICD-10-CM | POA: Diagnosis not present

## 2019-03-26 DIAGNOSIS — R1084 Generalized abdominal pain: Secondary | ICD-10-CM | POA: Diagnosis not present

## 2019-03-26 DIAGNOSIS — J439 Emphysema, unspecified: Secondary | ICD-10-CM | POA: Diagnosis not present

## 2019-03-26 DIAGNOSIS — R000 Tachycardia, unspecified: Secondary | ICD-10-CM | POA: Diagnosis not present

## 2019-03-26 DIAGNOSIS — Z7902 Long term (current) use of antithrombotics/antiplatelets: Secondary | ICD-10-CM | POA: Diagnosis not present

## 2019-03-26 DIAGNOSIS — Z87891 Personal history of nicotine dependence: Secondary | ICD-10-CM | POA: Diagnosis not present

## 2019-03-26 DIAGNOSIS — R197 Diarrhea, unspecified: Secondary | ICD-10-CM | POA: Diagnosis not present

## 2019-03-26 DIAGNOSIS — R110 Nausea: Secondary | ICD-10-CM | POA: Diagnosis not present

## 2019-03-26 DIAGNOSIS — R42 Dizziness and giddiness: Secondary | ICD-10-CM | POA: Diagnosis not present

## 2019-03-27 DIAGNOSIS — R42 Dizziness and giddiness: Secondary | ICD-10-CM | POA: Diagnosis not present

## 2019-03-27 DIAGNOSIS — R112 Nausea with vomiting, unspecified: Secondary | ICD-10-CM | POA: Diagnosis not present

## 2019-03-27 DIAGNOSIS — J439 Emphysema, unspecified: Secondary | ICD-10-CM | POA: Diagnosis not present

## 2019-03-27 DIAGNOSIS — R000 Tachycardia, unspecified: Secondary | ICD-10-CM | POA: Diagnosis not present

## 2019-04-17 DIAGNOSIS — J449 Chronic obstructive pulmonary disease, unspecified: Secondary | ICD-10-CM | POA: Diagnosis not present

## 2019-04-17 DIAGNOSIS — R063 Periodic breathing: Secondary | ICD-10-CM | POA: Diagnosis not present

## 2019-04-19 DIAGNOSIS — R0600 Dyspnea, unspecified: Secondary | ICD-10-CM | POA: Diagnosis not present

## 2019-04-19 DIAGNOSIS — R531 Weakness: Secondary | ICD-10-CM | POA: Diagnosis not present

## 2019-04-19 DIAGNOSIS — D62 Acute posthemorrhagic anemia: Secondary | ICD-10-CM | POA: Diagnosis not present

## 2019-04-19 DIAGNOSIS — J441 Chronic obstructive pulmonary disease with (acute) exacerbation: Secondary | ICD-10-CM | POA: Diagnosis not present

## 2019-04-19 DIAGNOSIS — R319 Hematuria, unspecified: Secondary | ICD-10-CM | POA: Diagnosis not present

## 2019-04-19 DIAGNOSIS — R7989 Other specified abnormal findings of blood chemistry: Secondary | ICD-10-CM | POA: Diagnosis not present

## 2019-04-20 DIAGNOSIS — N281 Cyst of kidney, acquired: Secondary | ICD-10-CM | POA: Diagnosis not present

## 2019-04-20 DIAGNOSIS — R918 Other nonspecific abnormal finding of lung field: Secondary | ICD-10-CM | POA: Diagnosis not present

## 2019-04-20 DIAGNOSIS — N179 Acute kidney failure, unspecified: Secondary | ICD-10-CM | POA: Diagnosis not present

## 2019-04-20 DIAGNOSIS — N200 Calculus of kidney: Secondary | ICD-10-CM | POA: Diagnosis not present

## 2019-04-20 DIAGNOSIS — Z7984 Long term (current) use of oral hypoglycemic drugs: Secondary | ICD-10-CM | POA: Diagnosis not present

## 2019-04-20 DIAGNOSIS — I739 Peripheral vascular disease, unspecified: Secondary | ICD-10-CM | POA: Diagnosis not present

## 2019-04-20 DIAGNOSIS — R319 Hematuria, unspecified: Secondary | ICD-10-CM | POA: Diagnosis not present

## 2019-04-20 DIAGNOSIS — I10 Essential (primary) hypertension: Secondary | ICD-10-CM | POA: Diagnosis not present

## 2019-04-20 DIAGNOSIS — J449 Chronic obstructive pulmonary disease, unspecified: Secondary | ICD-10-CM | POA: Diagnosis not present

## 2019-04-20 DIAGNOSIS — R000 Tachycardia, unspecified: Secondary | ICD-10-CM | POA: Diagnosis not present

## 2019-04-20 DIAGNOSIS — R0902 Hypoxemia: Secondary | ICD-10-CM | POA: Diagnosis not present

## 2019-04-20 DIAGNOSIS — Z7951 Long term (current) use of inhaled steroids: Secondary | ICD-10-CM | POA: Diagnosis not present

## 2019-04-20 DIAGNOSIS — J9611 Chronic respiratory failure with hypoxia: Secondary | ICD-10-CM | POA: Diagnosis not present

## 2019-04-20 DIAGNOSIS — E119 Type 2 diabetes mellitus without complications: Secondary | ICD-10-CM | POA: Diagnosis not present

## 2019-04-20 DIAGNOSIS — Z20822 Contact with and (suspected) exposure to covid-19: Secondary | ICD-10-CM | POA: Diagnosis not present

## 2019-04-20 DIAGNOSIS — Z902 Acquired absence of lung [part of]: Secondary | ICD-10-CM | POA: Diagnosis not present

## 2019-04-20 DIAGNOSIS — Z9981 Dependence on supplemental oxygen: Secondary | ICD-10-CM | POA: Diagnosis not present

## 2019-04-20 DIAGNOSIS — D649 Anemia, unspecified: Secondary | ICD-10-CM | POA: Diagnosis not present

## 2019-04-20 DIAGNOSIS — Z87891 Personal history of nicotine dependence: Secondary | ICD-10-CM | POA: Diagnosis not present

## 2019-04-20 DIAGNOSIS — E1151 Type 2 diabetes mellitus with diabetic peripheral angiopathy without gangrene: Secondary | ICD-10-CM | POA: Diagnosis not present

## 2019-04-21 MED ORDER — GENERIC EXTERNAL MEDICATION
Status: DC
Start: ? — End: 2019-04-21

## 2019-04-21 MED ORDER — ACETAMINOPHEN 325 MG PO TABS
650.00 | ORAL_TABLET | ORAL | Status: DC
Start: ? — End: 2019-04-21

## 2019-04-21 MED ORDER — MONTELUKAST SODIUM 10 MG PO TABS
10.00 | ORAL_TABLET | ORAL | Status: DC
Start: 2019-04-21 — End: 2019-04-21

## 2019-04-21 MED ORDER — HEPARIN SODIUM (PORCINE) 5000 UNIT/ML IJ SOLN
5000.00 | INTRAMUSCULAR | Status: DC
Start: 2019-04-21 — End: 2019-04-21

## 2019-04-21 MED ORDER — DEXTROSE 50 % IV SOLN
12.50 | INTRAVENOUS | Status: DC
Start: ? — End: 2019-04-21

## 2019-04-21 MED ORDER — ALBUTEROL SULFATE (2.5 MG/3ML) 0.083% IN NEBU
2.50 | INHALATION_SOLUTION | RESPIRATORY_TRACT | Status: DC
Start: ? — End: 2019-04-21

## 2019-04-21 MED ORDER — METOPROLOL TARTRATE 50 MG PO TABS
50.00 | ORAL_TABLET | ORAL | Status: DC
Start: 2019-04-21 — End: 2019-04-21

## 2019-04-21 MED ORDER — FERROUS SULFATE 325 (65 FE) MG PO TABS
325.00 | ORAL_TABLET | ORAL | Status: DC
Start: 2019-04-22 — End: 2019-04-21

## 2019-04-21 MED ORDER — CALCIUM CARBONATE ANTACID 500 MG PO CHEW
CHEWABLE_TABLET | ORAL | Status: DC
Start: ? — End: 2019-04-21

## 2019-04-21 MED ORDER — ATORVASTATIN CALCIUM 40 MG PO TABS
40.00 | ORAL_TABLET | ORAL | Status: DC
Start: 2019-04-21 — End: 2019-04-21

## 2019-04-21 MED ORDER — ONDANSETRON 4 MG PO TBDP
4.00 | ORAL_TABLET | ORAL | Status: DC
Start: ? — End: 2019-04-21

## 2019-04-21 MED ORDER — MELATONIN 3 MG PO TABS
3.00 | ORAL_TABLET | ORAL | Status: DC
Start: ? — End: 2019-04-21

## 2019-04-21 MED ORDER — INSULIN LISPRO 100 UNIT/ML ~~LOC~~ SOLN
0.00 | SUBCUTANEOUS | Status: DC
Start: 2019-04-21 — End: 2019-04-21

## 2019-04-25 DIAGNOSIS — J439 Emphysema, unspecified: Secondary | ICD-10-CM | POA: Diagnosis not present

## 2019-05-05 DIAGNOSIS — Z09 Encounter for follow-up examination after completed treatment for conditions other than malignant neoplasm: Secondary | ICD-10-CM | POA: Diagnosis not present

## 2019-05-05 DIAGNOSIS — R7989 Other specified abnormal findings of blood chemistry: Secondary | ICD-10-CM | POA: Diagnosis not present

## 2019-05-05 DIAGNOSIS — E538 Deficiency of other specified B group vitamins: Secondary | ICD-10-CM | POA: Diagnosis not present

## 2019-05-05 DIAGNOSIS — I10 Essential (primary) hypertension: Secondary | ICD-10-CM | POA: Diagnosis not present

## 2019-05-05 DIAGNOSIS — R0600 Dyspnea, unspecified: Secondary | ICD-10-CM | POA: Diagnosis not present

## 2019-05-23 DIAGNOSIS — J439 Emphysema, unspecified: Secondary | ICD-10-CM | POA: Diagnosis not present

## 2019-05-28 DIAGNOSIS — J439 Emphysema, unspecified: Secondary | ICD-10-CM | POA: Diagnosis not present

## 2019-05-28 DIAGNOSIS — R0602 Shortness of breath: Secondary | ICD-10-CM | POA: Diagnosis not present

## 2019-05-28 DIAGNOSIS — R079 Chest pain, unspecified: Secondary | ICD-10-CM | POA: Diagnosis not present

## 2019-06-09 DIAGNOSIS — R911 Solitary pulmonary nodule: Secondary | ICD-10-CM | POA: Diagnosis not present

## 2019-06-09 DIAGNOSIS — J432 Centrilobular emphysema: Secondary | ICD-10-CM | POA: Diagnosis not present

## 2019-06-09 DIAGNOSIS — Z122 Encounter for screening for malignant neoplasm of respiratory organs: Secondary | ICD-10-CM | POA: Diagnosis not present

## 2019-06-09 DIAGNOSIS — Z87891 Personal history of nicotine dependence: Secondary | ICD-10-CM | POA: Diagnosis not present

## 2019-06-09 DIAGNOSIS — R918 Other nonspecific abnormal finding of lung field: Secondary | ICD-10-CM | POA: Diagnosis not present

## 2019-06-09 DIAGNOSIS — Z902 Acquired absence of lung [part of]: Secondary | ICD-10-CM | POA: Diagnosis not present

## 2019-06-09 DIAGNOSIS — J438 Other emphysema: Secondary | ICD-10-CM | POA: Diagnosis not present

## 2019-06-10 DIAGNOSIS — R0602 Shortness of breath: Secondary | ICD-10-CM | POA: Diagnosis not present

## 2019-06-23 DIAGNOSIS — J439 Emphysema, unspecified: Secondary | ICD-10-CM | POA: Diagnosis not present

## 2019-06-30 ENCOUNTER — Other Ambulatory Visit: Payer: Self-pay

## 2019-06-30 ENCOUNTER — Emergency Department: Payer: PPO

## 2019-06-30 ENCOUNTER — Emergency Department
Admission: EM | Admit: 2019-06-30 | Discharge: 2019-06-30 | Disposition: A | Discharge status: Home / Self Care | Payer: PPO | Attending: Emergency Medicine | Admitting: Emergency Medicine

## 2019-06-30 DIAGNOSIS — E119 Type 2 diabetes mellitus without complications: Secondary | ICD-10-CM | POA: Diagnosis not present

## 2019-06-30 DIAGNOSIS — Z87891 Personal history of nicotine dependence: Secondary | ICD-10-CM | POA: Diagnosis not present

## 2019-06-30 DIAGNOSIS — J449 Chronic obstructive pulmonary disease, unspecified: Secondary | ICD-10-CM | POA: Diagnosis not present

## 2019-06-30 DIAGNOSIS — Y999 Unspecified external cause status: Secondary | ICD-10-CM | POA: Diagnosis not present

## 2019-06-30 DIAGNOSIS — Z7984 Long term (current) use of oral hypoglycemic drugs: Secondary | ICD-10-CM | POA: Diagnosis not present

## 2019-06-30 DIAGNOSIS — Z7982 Long term (current) use of aspirin: Secondary | ICD-10-CM | POA: Diagnosis not present

## 2019-06-30 DIAGNOSIS — Z79899 Other long term (current) drug therapy: Secondary | ICD-10-CM | POA: Diagnosis not present

## 2019-06-30 DIAGNOSIS — Z7901 Long term (current) use of anticoagulants: Secondary | ICD-10-CM | POA: Insufficient documentation

## 2019-06-30 DIAGNOSIS — S199XXA Unspecified injury of neck, initial encounter: Secondary | ICD-10-CM | POA: Diagnosis not present

## 2019-06-30 DIAGNOSIS — I2510 Atherosclerotic heart disease of native coronary artery without angina pectoris: Secondary | ICD-10-CM | POA: Diagnosis not present

## 2019-06-30 DIAGNOSIS — S161XXA Strain of muscle, fascia and tendon at neck level, initial encounter: Secondary | ICD-10-CM | POA: Diagnosis not present

## 2019-06-30 DIAGNOSIS — Y92410 Unspecified street and highway as the place of occurrence of the external cause: Secondary | ICD-10-CM | POA: Insufficient documentation

## 2019-06-30 DIAGNOSIS — I10 Essential (primary) hypertension: Secondary | ICD-10-CM | POA: Insufficient documentation

## 2019-06-30 DIAGNOSIS — Y939 Activity, unspecified: Secondary | ICD-10-CM | POA: Insufficient documentation

## 2019-06-30 DIAGNOSIS — S0990XA Unspecified injury of head, initial encounter: Secondary | ICD-10-CM | POA: Diagnosis not present

## 2019-06-30 MED ORDER — TRAMADOL HCL 50 MG PO TABS: 50.0000 mg | ORAL_TABLET | Freq: Four times a day (QID) | ORAL | 0 refills | Status: DC | PRN

## 2019-06-30 MED ORDER — TIZANIDINE HCL 2 MG PO TABS: 2.0000 mg | ORAL_TABLET | Freq: Three times a day (TID) | ORAL | 1 refills | Status: DC

## 2019-06-30 NOTE — Discharge Instructions (Addendum)
Please take muscle relaxer 3 times daily as needed.  Take tramadol as needed for moderate to severe pain.  Take Tylenol as needed for mild pain.  Follow-up with primary care provider or orthopedics in 1 week if no improvement.  Return to the ER for any weakness, increasing pain, worsening symptoms or urgent changes in health.
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
## 2019-07-12 DIAGNOSIS — M542 Cervicalgia: Secondary | ICD-10-CM | POA: Diagnosis not present

## 2019-07-12 DIAGNOSIS — M50321 Other cervical disc degeneration at C4-C5 level: Secondary | ICD-10-CM | POA: Diagnosis not present

## 2019-07-12 DIAGNOSIS — M9901 Segmental and somatic dysfunction of cervical region: Secondary | ICD-10-CM | POA: Diagnosis not present

## 2019-07-12 DIAGNOSIS — M9902 Segmental and somatic dysfunction of thoracic region: Secondary | ICD-10-CM | POA: Diagnosis not present

## 2019-07-19 DIAGNOSIS — M9901 Segmental and somatic dysfunction of cervical region: Secondary | ICD-10-CM | POA: Diagnosis not present

## 2019-07-19 DIAGNOSIS — M50321 Other cervical disc degeneration at C4-C5 level: Secondary | ICD-10-CM | POA: Diagnosis not present

## 2019-07-19 DIAGNOSIS — M9902 Segmental and somatic dysfunction of thoracic region: Secondary | ICD-10-CM | POA: Diagnosis not present

## 2019-07-19 DIAGNOSIS — M542 Cervicalgia: Secondary | ICD-10-CM | POA: Diagnosis not present

## 2019-07-23 DIAGNOSIS — J439 Emphysema, unspecified: Secondary | ICD-10-CM | POA: Diagnosis not present

## 2019-07-26 DIAGNOSIS — M9901 Segmental and somatic dysfunction of cervical region: Secondary | ICD-10-CM | POA: Diagnosis not present

## 2019-07-26 DIAGNOSIS — M50321 Other cervical disc degeneration at C4-C5 level: Secondary | ICD-10-CM | POA: Diagnosis not present

## 2019-07-26 DIAGNOSIS — M9902 Segmental and somatic dysfunction of thoracic region: Secondary | ICD-10-CM | POA: Diagnosis not present

## 2019-07-26 DIAGNOSIS — M542 Cervicalgia: Secondary | ICD-10-CM | POA: Diagnosis not present

## 2019-08-03 DIAGNOSIS — M50321 Other cervical disc degeneration at C4-C5 level: Secondary | ICD-10-CM | POA: Diagnosis not present

## 2019-08-03 DIAGNOSIS — M9902 Segmental and somatic dysfunction of thoracic region: Secondary | ICD-10-CM | POA: Diagnosis not present

## 2019-08-03 DIAGNOSIS — M542 Cervicalgia: Secondary | ICD-10-CM | POA: Diagnosis not present

## 2019-08-03 DIAGNOSIS — M9901 Segmental and somatic dysfunction of cervical region: Secondary | ICD-10-CM | POA: Diagnosis not present

## 2019-08-10 DIAGNOSIS — M9901 Segmental and somatic dysfunction of cervical region: Secondary | ICD-10-CM | POA: Diagnosis not present

## 2019-08-10 DIAGNOSIS — M50321 Other cervical disc degeneration at C4-C5 level: Secondary | ICD-10-CM | POA: Diagnosis not present

## 2019-08-10 DIAGNOSIS — M9902 Segmental and somatic dysfunction of thoracic region: Secondary | ICD-10-CM | POA: Diagnosis not present

## 2019-08-10 DIAGNOSIS — M542 Cervicalgia: Secondary | ICD-10-CM | POA: Diagnosis not present

## 2019-08-16 DIAGNOSIS — M9902 Segmental and somatic dysfunction of thoracic region: Secondary | ICD-10-CM | POA: Diagnosis not present

## 2019-08-16 DIAGNOSIS — M9901 Segmental and somatic dysfunction of cervical region: Secondary | ICD-10-CM | POA: Diagnosis not present

## 2019-08-16 DIAGNOSIS — M542 Cervicalgia: Secondary | ICD-10-CM | POA: Diagnosis not present

## 2019-08-16 DIAGNOSIS — M50321 Other cervical disc degeneration at C4-C5 level: Secondary | ICD-10-CM | POA: Diagnosis not present

## 2019-08-23 DIAGNOSIS — M50321 Other cervical disc degeneration at C4-C5 level: Secondary | ICD-10-CM | POA: Diagnosis not present

## 2019-08-23 DIAGNOSIS — J439 Emphysema, unspecified: Secondary | ICD-10-CM | POA: Diagnosis not present

## 2019-08-23 DIAGNOSIS — M9902 Segmental and somatic dysfunction of thoracic region: Secondary | ICD-10-CM | POA: Diagnosis not present

## 2019-08-23 DIAGNOSIS — M9901 Segmental and somatic dysfunction of cervical region: Secondary | ICD-10-CM | POA: Diagnosis not present

## 2019-08-23 DIAGNOSIS — M542 Cervicalgia: Secondary | ICD-10-CM | POA: Diagnosis not present

## 2019-08-30 DIAGNOSIS — M9901 Segmental and somatic dysfunction of cervical region: Secondary | ICD-10-CM | POA: Diagnosis not present

## 2019-08-30 DIAGNOSIS — M542 Cervicalgia: Secondary | ICD-10-CM | POA: Diagnosis not present

## 2019-08-30 DIAGNOSIS — M50321 Other cervical disc degeneration at C4-C5 level: Secondary | ICD-10-CM | POA: Diagnosis not present

## 2019-08-30 DIAGNOSIS — M9902 Segmental and somatic dysfunction of thoracic region: Secondary | ICD-10-CM | POA: Diagnosis not present

## 2019-09-03 DIAGNOSIS — I6522 Occlusion and stenosis of left carotid artery: Secondary | ICD-10-CM | POA: Diagnosis not present

## 2019-09-03 DIAGNOSIS — G4733 Obstructive sleep apnea (adult) (pediatric): Secondary | ICD-10-CM | POA: Diagnosis not present

## 2019-09-03 DIAGNOSIS — E119 Type 2 diabetes mellitus without complications: Secondary | ICD-10-CM | POA: Diagnosis not present

## 2019-09-03 DIAGNOSIS — I2510 Atherosclerotic heart disease of native coronary artery without angina pectoris: Secondary | ICD-10-CM | POA: Diagnosis not present

## 2019-09-03 DIAGNOSIS — J439 Emphysema, unspecified: Secondary | ICD-10-CM | POA: Diagnosis not present

## 2019-09-03 DIAGNOSIS — I10 Essential (primary) hypertension: Secondary | ICD-10-CM | POA: Diagnosis not present

## 2019-09-03 DIAGNOSIS — E7800 Pure hypercholesterolemia, unspecified: Secondary | ICD-10-CM | POA: Diagnosis not present

## 2019-09-03 DIAGNOSIS — I739 Peripheral vascular disease, unspecified: Secondary | ICD-10-CM | POA: Diagnosis not present

## 2019-09-07 DIAGNOSIS — M50321 Other cervical disc degeneration at C4-C5 level: Secondary | ICD-10-CM | POA: Diagnosis not present

## 2019-09-07 DIAGNOSIS — M542 Cervicalgia: Secondary | ICD-10-CM | POA: Diagnosis not present

## 2019-09-07 DIAGNOSIS — M9901 Segmental and somatic dysfunction of cervical region: Secondary | ICD-10-CM | POA: Diagnosis not present

## 2019-09-07 DIAGNOSIS — M9902 Segmental and somatic dysfunction of thoracic region: Secondary | ICD-10-CM | POA: Diagnosis not present

## 2019-09-13 DIAGNOSIS — M542 Cervicalgia: Secondary | ICD-10-CM | POA: Diagnosis not present

## 2019-09-13 DIAGNOSIS — M9901 Segmental and somatic dysfunction of cervical region: Secondary | ICD-10-CM | POA: Diagnosis not present

## 2019-09-13 DIAGNOSIS — M50321 Other cervical disc degeneration at C4-C5 level: Secondary | ICD-10-CM | POA: Diagnosis not present

## 2019-09-13 DIAGNOSIS — M9902 Segmental and somatic dysfunction of thoracic region: Secondary | ICD-10-CM | POA: Diagnosis not present

## 2019-09-17 DIAGNOSIS — J449 Chronic obstructive pulmonary disease, unspecified: Secondary | ICD-10-CM | POA: Diagnosis not present

## 2019-09-17 DIAGNOSIS — Z87891 Personal history of nicotine dependence: Secondary | ICD-10-CM | POA: Diagnosis not present

## 2019-09-17 DIAGNOSIS — I10 Essential (primary) hypertension: Secondary | ICD-10-CM | POA: Diagnosis not present

## 2019-09-17 DIAGNOSIS — R918 Other nonspecific abnormal finding of lung field: Secondary | ICD-10-CM | POA: Diagnosis not present

## 2019-09-17 DIAGNOSIS — Z122 Encounter for screening for malignant neoplasm of respiratory organs: Secondary | ICD-10-CM | POA: Diagnosis not present

## 2019-09-17 DIAGNOSIS — R911 Solitary pulmonary nodule: Secondary | ICD-10-CM | POA: Diagnosis not present

## 2019-09-17 DIAGNOSIS — E119 Type 2 diabetes mellitus without complications: Secondary | ICD-10-CM | POA: Diagnosis not present

## 2019-09-17 DIAGNOSIS — E785 Hyperlipidemia, unspecified: Secondary | ICD-10-CM | POA: Diagnosis not present

## 2019-09-17 DIAGNOSIS — Z955 Presence of coronary angioplasty implant and graft: Secondary | ICD-10-CM | POA: Diagnosis not present

## 2019-09-22 DIAGNOSIS — J439 Emphysema, unspecified: Secondary | ICD-10-CM | POA: Diagnosis not present

## 2019-09-27 DIAGNOSIS — E7800 Pure hypercholesterolemia, unspecified: Secondary | ICD-10-CM | POA: Diagnosis not present

## 2019-09-27 DIAGNOSIS — M9902 Segmental and somatic dysfunction of thoracic region: Secondary | ICD-10-CM | POA: Diagnosis not present

## 2019-09-27 DIAGNOSIS — Z87891 Personal history of nicotine dependence: Secondary | ICD-10-CM | POA: Diagnosis not present

## 2019-09-27 DIAGNOSIS — R079 Chest pain, unspecified: Secondary | ICD-10-CM | POA: Diagnosis not present

## 2019-09-27 DIAGNOSIS — M542 Cervicalgia: Secondary | ICD-10-CM | POA: Diagnosis not present

## 2019-09-27 DIAGNOSIS — I2510 Atherosclerotic heart disease of native coronary artery without angina pectoris: Secondary | ICD-10-CM | POA: Diagnosis not present

## 2019-09-27 DIAGNOSIS — M50321 Other cervical disc degeneration at C4-C5 level: Secondary | ICD-10-CM | POA: Diagnosis not present

## 2019-09-27 DIAGNOSIS — J439 Emphysema, unspecified: Secondary | ICD-10-CM | POA: Diagnosis not present

## 2019-09-27 DIAGNOSIS — G4733 Obstructive sleep apnea (adult) (pediatric): Secondary | ICD-10-CM | POA: Diagnosis not present

## 2019-09-27 DIAGNOSIS — M9901 Segmental and somatic dysfunction of cervical region: Secondary | ICD-10-CM | POA: Diagnosis not present

## 2019-09-27 DIAGNOSIS — I10 Essential (primary) hypertension: Secondary | ICD-10-CM | POA: Diagnosis not present

## 2019-09-27 DIAGNOSIS — I6522 Occlusion and stenosis of left carotid artery: Secondary | ICD-10-CM | POA: Diagnosis not present

## 2019-09-27 DIAGNOSIS — E119 Type 2 diabetes mellitus without complications: Secondary | ICD-10-CM | POA: Diagnosis not present

## 2019-10-05 DIAGNOSIS — M50321 Other cervical disc degeneration at C4-C5 level: Secondary | ICD-10-CM | POA: Diagnosis not present

## 2019-10-05 DIAGNOSIS — M542 Cervicalgia: Secondary | ICD-10-CM | POA: Diagnosis not present

## 2019-10-05 DIAGNOSIS — M9901 Segmental and somatic dysfunction of cervical region: Secondary | ICD-10-CM | POA: Diagnosis not present

## 2019-10-05 DIAGNOSIS — M9902 Segmental and somatic dysfunction of thoracic region: Secondary | ICD-10-CM | POA: Diagnosis not present

## 2019-10-11 DIAGNOSIS — R079 Chest pain, unspecified: Secondary | ICD-10-CM | POA: Diagnosis not present

## 2019-10-11 DIAGNOSIS — I10 Essential (primary) hypertension: Secondary | ICD-10-CM | POA: Diagnosis not present

## 2019-10-11 DIAGNOSIS — Z87891 Personal history of nicotine dependence: Secondary | ICD-10-CM | POA: Diagnosis not present

## 2019-10-11 DIAGNOSIS — E119 Type 2 diabetes mellitus without complications: Secondary | ICD-10-CM | POA: Diagnosis not present

## 2019-10-11 DIAGNOSIS — E7800 Pure hypercholesterolemia, unspecified: Secondary | ICD-10-CM | POA: Diagnosis not present

## 2019-10-11 DIAGNOSIS — I2510 Atherosclerotic heart disease of native coronary artery without angina pectoris: Secondary | ICD-10-CM | POA: Diagnosis not present

## 2019-10-25 DIAGNOSIS — J439 Emphysema, unspecified: Secondary | ICD-10-CM | POA: Diagnosis not present

## 2019-10-25 DIAGNOSIS — I2510 Atherosclerotic heart disease of native coronary artery without angina pectoris: Secondary | ICD-10-CM | POA: Diagnosis not present

## 2019-10-25 DIAGNOSIS — I10 Essential (primary) hypertension: Secondary | ICD-10-CM | POA: Diagnosis not present

## 2019-10-29 ENCOUNTER — Other Ambulatory Visit
Admission: RE | Admit: 2019-10-29 | Discharge: 2019-10-29 | Disposition: A | Discharge status: Home / Self Care | Payer: PPO | Source: Ambulatory Visit | Attending: Cardiology | Admitting: Cardiology

## 2019-10-29 ENCOUNTER — Other Ambulatory Visit: Payer: Self-pay

## 2019-10-29 DIAGNOSIS — Z01812 Encounter for preprocedural laboratory examination: Secondary | ICD-10-CM | POA: Insufficient documentation

## 2019-10-29 DIAGNOSIS — Z20822 Contact with and (suspected) exposure to covid-19: Secondary | ICD-10-CM | POA: Insufficient documentation

## 2019-10-29 LAB — SARS CORONAVIRUS 2 (TAT 6-24 HRS): SARS Coronavirus 2: NEGATIVE

## 2019-11-01 ENCOUNTER — Other Ambulatory Visit: Payer: Self-pay | Admitting: Cardiology

## 2019-11-01 MED ORDER — SODIUM CHLORIDE 0.9% FLUSH: 3.0000 mL | Freq: Two times a day (BID) | INTRAVENOUS | Status: AC

## 2019-11-02 ENCOUNTER — Ambulatory Visit
Admission: RE | Admit: 2019-11-02 | Discharge: 2019-11-02 | Disposition: A | Discharge status: Home / Self Care | Payer: PPO | Attending: Cardiology | Admitting: Cardiology

## 2019-11-02 ENCOUNTER — Other Ambulatory Visit: Payer: Self-pay

## 2019-11-02 ENCOUNTER — Encounter: Payer: Self-pay | Admitting: Cardiology

## 2019-11-02 ENCOUNTER — Encounter
Admission: RE | Disposition: A | Discharge status: Home / Self Care | Payer: Self-pay | Source: Home / Self Care | Attending: Cardiology

## 2019-11-02 DIAGNOSIS — Z7984 Long term (current) use of oral hypoglycemic drugs: Secondary | ICD-10-CM | POA: Diagnosis not present

## 2019-11-02 DIAGNOSIS — K5730 Diverticulosis of large intestine without perforation or abscess without bleeding: Secondary | ICD-10-CM | POA: Insufficient documentation

## 2019-11-02 DIAGNOSIS — Z7982 Long term (current) use of aspirin: Secondary | ICD-10-CM | POA: Insufficient documentation

## 2019-11-02 DIAGNOSIS — Z8249 Family history of ischemic heart disease and other diseases of the circulatory system: Secondary | ICD-10-CM | POA: Insufficient documentation

## 2019-11-02 DIAGNOSIS — E785 Hyperlipidemia, unspecified: Secondary | ICD-10-CM | POA: Diagnosis not present

## 2019-11-02 DIAGNOSIS — I509 Heart failure, unspecified: Secondary | ICD-10-CM | POA: Diagnosis not present

## 2019-11-02 DIAGNOSIS — I2582 Chronic total occlusion of coronary artery: Secondary | ICD-10-CM | POA: Insufficient documentation

## 2019-11-02 DIAGNOSIS — J449 Chronic obstructive pulmonary disease, unspecified: Secondary | ICD-10-CM | POA: Insufficient documentation

## 2019-11-02 DIAGNOSIS — Z79899 Other long term (current) drug therapy: Secondary | ICD-10-CM | POA: Diagnosis not present

## 2019-11-02 DIAGNOSIS — I2510 Atherosclerotic heart disease of native coronary artery without angina pectoris: Secondary | ICD-10-CM | POA: Insufficient documentation

## 2019-11-02 DIAGNOSIS — K219 Gastro-esophageal reflux disease without esophagitis: Secondary | ICD-10-CM | POA: Insufficient documentation

## 2019-11-02 DIAGNOSIS — R0609 Other forms of dyspnea: Secondary | ICD-10-CM | POA: Insufficient documentation

## 2019-11-02 DIAGNOSIS — E1151 Type 2 diabetes mellitus with diabetic peripheral angiopathy without gangrene: Secondary | ICD-10-CM | POA: Insufficient documentation

## 2019-11-02 DIAGNOSIS — E7800 Pure hypercholesterolemia, unspecified: Secondary | ICD-10-CM | POA: Insufficient documentation

## 2019-11-02 DIAGNOSIS — R9430 Abnormal result of cardiovascular function study, unspecified: Secondary | ICD-10-CM

## 2019-11-02 DIAGNOSIS — I110 Hypertensive heart disease with heart failure: Secondary | ICD-10-CM | POA: Insufficient documentation

## 2019-11-02 DIAGNOSIS — Z87891 Personal history of nicotine dependence: Secondary | ICD-10-CM | POA: Insufficient documentation

## 2019-11-02 HISTORY — PX: LEFT HEART CATH AND CORONARY ANGIOGRAPHY: CATH118249

## 2019-11-02 LAB — GLUCOSE, CAPILLARY: Glucose-Capillary: 166 mg/dL — ABNORMAL HIGH (ref 70–99)

## 2019-11-02 SURGERY — LEFT HEART CATH AND CORONARY ANGIOGRAPHY
Anesthesia: Moderate Sedation | Laterality: Left

## 2019-11-02 MED ORDER — HEPARIN (PORCINE) IN NACL 1000-0.9 UT/500ML-% IV SOLN
INTRAVENOUS | Status: AC
  Filled 2019-11-02: qty 1000

## 2019-11-02 MED ORDER — ONDANSETRON HCL 4 MG/2ML IJ SOLN: 4.0000 mg | Freq: Four times a day (QID) | INTRAMUSCULAR | Status: DC | PRN

## 2019-11-02 MED ORDER — SODIUM CHLORIDE 0.9% FLUSH: 3.0000 mL | INTRAVENOUS | Status: DC | PRN

## 2019-11-02 MED ORDER — SODIUM CHLORIDE 0.9 % WEIGHT BASED INFUSION: 1.0000 mL/kg/h | INTRAVENOUS | Status: DC

## 2019-11-02 MED ORDER — SODIUM CHLORIDE 0.9 % WEIGHT BASED INFUSION
3.0000 mL/kg/h | INTRAVENOUS | Status: AC
  Administered 2019-11-02: 3 mL/kg/h via INTRAVENOUS

## 2019-11-02 MED ORDER — HYDRALAZINE HCL 20 MG/ML IJ SOLN: 10.0000 mg | INTRAMUSCULAR | Status: DC | PRN

## 2019-11-02 MED ORDER — MIDAZOLAM HCL 2 MG/2ML IJ SOLN
INTRAMUSCULAR | Status: DC | PRN
  Administered 2019-11-02: 1 mg via INTRAVENOUS

## 2019-11-02 MED ORDER — FENTANYL CITRATE (PF) 100 MCG/2ML IJ SOLN
INTRAMUSCULAR | Status: DC | PRN
  Administered 2019-11-02: 25 ug via INTRAVENOUS

## 2019-11-02 MED ORDER — SODIUM CHLORIDE 0.9% FLUSH: 3.0000 mL | Freq: Two times a day (BID) | INTRAVENOUS | Status: DC

## 2019-11-02 MED ORDER — FENTANYL CITRATE (PF) 100 MCG/2ML IJ SOLN
INTRAMUSCULAR | Status: AC
  Filled 2019-11-02: qty 2

## 2019-11-02 MED ORDER — LABETALOL HCL 5 MG/ML IV SOLN: 10.0000 mg | INTRAVENOUS | Status: DC | PRN

## 2019-11-02 MED ORDER — SODIUM CHLORIDE 0.9 % IV SOLN: 250.0000 mL | INTRAVENOUS | Status: DC | PRN

## 2019-11-02 MED ORDER — ACETAMINOPHEN 325 MG PO TABS: 650.0000 mg | ORAL_TABLET | ORAL | Status: DC | PRN

## 2019-11-02 MED ORDER — ASPIRIN 81 MG PO CHEW
CHEWABLE_TABLET | ORAL | Status: AC
  Filled 2019-11-02: qty 1

## 2019-11-02 MED ORDER — ASPIRIN 81 MG PO CHEW
81.0000 mg | CHEWABLE_TABLET | ORAL | Status: AC
  Administered 2019-11-02: 81 mg via ORAL

## 2019-11-02 MED ORDER — IOHEXOL 300 MG/ML  SOLN
INTRAMUSCULAR | Status: DC | PRN
  Administered 2019-11-02: 130 mL

## 2019-11-02 MED ORDER — MIDAZOLAM HCL 2 MG/2ML IJ SOLN
INTRAMUSCULAR | Status: AC
  Filled 2019-11-02: qty 2

## 2019-11-02 SURGICAL SUPPLY — 11 items
CATH INFINITI 5 FR JL3.5 (CATHETERS) ×3 IMPLANT
CATH INFINITI 5FR JL4 (CATHETERS) ×3 IMPLANT
CATH INFINITI JR4 5F (CATHETERS) ×3 IMPLANT
CATH LAUNCHER 5F EBU3.5 (CATHETERS) ×3 IMPLANT
CATH VISTA GUIDE 6FR XB3.5 (CATHETERS) IMPLANT
DEVICE CLOSURE MYNXGRIP 5F (Vascular Products) ×3 IMPLANT
KIT MANI 3VAL PERCEP (MISCELLANEOUS) ×3 IMPLANT
NEEDLE PERC 18GX7CM (NEEDLE) ×3 IMPLANT
PACK CARDIAC CATH (CUSTOM PROCEDURE TRAY) ×3 IMPLANT
SHEATH AVANTI 5FR X 11CM (SHEATH) ×3 IMPLANT
WIRE GUIDERIGHT .035X150 (WIRE) ×3 IMPLANT
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
## 2019-11-15 DIAGNOSIS — Z87891 Personal history of nicotine dependence: Secondary | ICD-10-CM | POA: Diagnosis not present

## 2019-11-15 DIAGNOSIS — Z902 Acquired absence of lung [part of]: Secondary | ICD-10-CM | POA: Diagnosis not present

## 2019-11-15 DIAGNOSIS — I739 Peripheral vascular disease, unspecified: Secondary | ICD-10-CM | POA: Diagnosis not present

## 2019-11-15 DIAGNOSIS — Z7982 Long term (current) use of aspirin: Secondary | ICD-10-CM | POA: Diagnosis not present

## 2019-11-15 DIAGNOSIS — D6869 Other thrombophilia: Secondary | ICD-10-CM | POA: Diagnosis not present

## 2019-11-15 DIAGNOSIS — N189 Chronic kidney disease, unspecified: Secondary | ICD-10-CM | POA: Diagnosis not present

## 2019-11-15 DIAGNOSIS — J449 Chronic obstructive pulmonary disease, unspecified: Secondary | ICD-10-CM | POA: Diagnosis not present

## 2019-11-15 DIAGNOSIS — Z7951 Long term (current) use of inhaled steroids: Secondary | ICD-10-CM | POA: Diagnosis not present

## 2019-11-15 DIAGNOSIS — R0602 Shortness of breath: Secondary | ICD-10-CM | POA: Diagnosis not present

## 2019-11-15 DIAGNOSIS — Z7902 Long term (current) use of antithrombotics/antiplatelets: Secondary | ICD-10-CM | POA: Diagnosis not present

## 2019-11-15 DIAGNOSIS — I129 Hypertensive chronic kidney disease with stage 1 through stage 4 chronic kidney disease, or unspecified chronic kidney disease: Secondary | ICD-10-CM | POA: Diagnosis not present

## 2019-11-15 DIAGNOSIS — E1122 Type 2 diabetes mellitus with diabetic chronic kidney disease: Secondary | ICD-10-CM | POA: Diagnosis not present

## 2019-11-15 DIAGNOSIS — A4189 Other specified sepsis: Secondary | ICD-10-CM | POA: Diagnosis not present

## 2019-11-15 DIAGNOSIS — J9601 Acute respiratory failure with hypoxia: Secondary | ICD-10-CM | POA: Diagnosis not present

## 2019-11-15 DIAGNOSIS — Z20822 Contact with and (suspected) exposure to covid-19: Secondary | ICD-10-CM | POA: Diagnosis not present

## 2019-11-15 DIAGNOSIS — R42 Dizziness and giddiness: Secondary | ICD-10-CM | POA: Diagnosis not present

## 2019-11-15 DIAGNOSIS — E785 Hyperlipidemia, unspecified: Secondary | ICD-10-CM | POA: Diagnosis not present

## 2019-11-15 DIAGNOSIS — I952 Hypotension due to drugs: Secondary | ICD-10-CM | POA: Diagnosis not present

## 2019-11-15 DIAGNOSIS — U071 COVID-19: Secondary | ICD-10-CM | POA: Diagnosis not present

## 2019-11-15 DIAGNOSIS — E1151 Type 2 diabetes mellitus with diabetic peripheral angiopathy without gangrene: Secondary | ICD-10-CM | POA: Diagnosis not present

## 2019-11-15 DIAGNOSIS — R6883 Chills (without fever): Secondary | ICD-10-CM | POA: Diagnosis not present

## 2019-11-15 DIAGNOSIS — Z79899 Other long term (current) drug therapy: Secondary | ICD-10-CM | POA: Diagnosis not present

## 2019-11-15 DIAGNOSIS — T464X5A Adverse effect of angiotensin-converting-enzyme inhibitors, initial encounter: Secondary | ICD-10-CM | POA: Diagnosis not present

## 2019-11-15 DIAGNOSIS — R05 Cough: Secondary | ICD-10-CM | POA: Diagnosis not present

## 2019-11-15 DIAGNOSIS — Z9981 Dependence on supplemental oxygen: Secondary | ICD-10-CM | POA: Diagnosis not present

## 2019-11-15 DIAGNOSIS — I4510 Unspecified right bundle-branch block: Secondary | ICD-10-CM | POA: Diagnosis not present

## 2019-11-15 DIAGNOSIS — J441 Chronic obstructive pulmonary disease with (acute) exacerbation: Secondary | ICD-10-CM | POA: Diagnosis not present

## 2019-11-15 DIAGNOSIS — I10 Essential (primary) hypertension: Secondary | ICD-10-CM | POA: Diagnosis not present

## 2019-11-15 DIAGNOSIS — R6520 Severe sepsis without septic shock: Secondary | ICD-10-CM | POA: Diagnosis not present

## 2019-11-15 DIAGNOSIS — G4733 Obstructive sleep apnea (adult) (pediatric): Secondary | ICD-10-CM | POA: Diagnosis not present

## 2019-11-16 DIAGNOSIS — R42 Dizziness and giddiness: Secondary | ICD-10-CM | POA: Insufficient documentation

## 2019-12-01 DIAGNOSIS — Z09 Encounter for follow-up examination after completed treatment for conditions other than malignant neoplasm: Secondary | ICD-10-CM | POA: Diagnosis not present

## 2019-12-01 DIAGNOSIS — J1282 Pneumonia due to coronavirus disease 2019: Secondary | ICD-10-CM | POA: Diagnosis not present

## 2019-12-01 DIAGNOSIS — Z7902 Long term (current) use of antithrombotics/antiplatelets: Secondary | ICD-10-CM | POA: Diagnosis not present

## 2019-12-01 DIAGNOSIS — D6869 Other thrombophilia: Secondary | ICD-10-CM | POA: Diagnosis not present

## 2019-12-01 DIAGNOSIS — R918 Other nonspecific abnormal finding of lung field: Secondary | ICD-10-CM | POA: Diagnosis not present

## 2019-12-01 DIAGNOSIS — J440 Chronic obstructive pulmonary disease with acute lower respiratory infection: Secondary | ICD-10-CM | POA: Diagnosis not present

## 2019-12-01 DIAGNOSIS — Z8616 Personal history of COVID-19: Secondary | ICD-10-CM | POA: Diagnosis not present

## 2019-12-01 DIAGNOSIS — J449 Chronic obstructive pulmonary disease, unspecified: Secondary | ICD-10-CM | POA: Diagnosis not present

## 2019-12-01 DIAGNOSIS — Y95 Nosocomial condition: Secondary | ICD-10-CM | POA: Diagnosis not present

## 2019-12-01 DIAGNOSIS — Z7984 Long term (current) use of oral hypoglycemic drugs: Secondary | ICD-10-CM | POA: Diagnosis not present

## 2019-12-01 DIAGNOSIS — G4733 Obstructive sleep apnea (adult) (pediatric): Secondary | ICD-10-CM | POA: Diagnosis not present

## 2019-12-01 DIAGNOSIS — R0989 Other specified symptoms and signs involving the circulatory and respiratory systems: Secondary | ICD-10-CM | POA: Diagnosis not present

## 2019-12-01 DIAGNOSIS — Z7982 Long term (current) use of aspirin: Secondary | ICD-10-CM | POA: Diagnosis not present

## 2019-12-01 DIAGNOSIS — J439 Emphysema, unspecified: Secondary | ICD-10-CM | POA: Diagnosis not present

## 2019-12-01 DIAGNOSIS — I129 Hypertensive chronic kidney disease with stage 1 through stage 4 chronic kidney disease, or unspecified chronic kidney disease: Secondary | ICD-10-CM | POA: Diagnosis not present

## 2019-12-01 DIAGNOSIS — Z7951 Long term (current) use of inhaled steroids: Secondary | ICD-10-CM | POA: Diagnosis not present

## 2019-12-01 DIAGNOSIS — J189 Pneumonia, unspecified organism: Secondary | ICD-10-CM | POA: Diagnosis not present

## 2019-12-01 DIAGNOSIS — J438 Other emphysema: Secondary | ICD-10-CM | POA: Diagnosis not present

## 2019-12-01 DIAGNOSIS — U071 COVID-19: Secondary | ICD-10-CM | POA: Diagnosis not present

## 2019-12-01 DIAGNOSIS — I10 Essential (primary) hypertension: Secondary | ICD-10-CM | POA: Diagnosis not present

## 2019-12-01 DIAGNOSIS — E785 Hyperlipidemia, unspecified: Secondary | ICD-10-CM | POA: Diagnosis not present

## 2019-12-01 DIAGNOSIS — R5381 Other malaise: Secondary | ICD-10-CM | POA: Diagnosis not present

## 2019-12-01 DIAGNOSIS — E119 Type 2 diabetes mellitus without complications: Secondary | ICD-10-CM | POA: Diagnosis not present

## 2019-12-01 DIAGNOSIS — N182 Chronic kidney disease, stage 2 (mild): Secondary | ICD-10-CM | POA: Diagnosis not present

## 2019-12-01 DIAGNOSIS — J159 Unspecified bacterial pneumonia: Secondary | ICD-10-CM | POA: Diagnosis not present

## 2019-12-01 DIAGNOSIS — J9610 Chronic respiratory failure, unspecified whether with hypoxia or hypercapnia: Secondary | ICD-10-CM | POA: Diagnosis not present

## 2019-12-01 DIAGNOSIS — Z87891 Personal history of nicotine dependence: Secondary | ICD-10-CM | POA: Diagnosis not present

## 2019-12-01 DIAGNOSIS — E1122 Type 2 diabetes mellitus with diabetic chronic kidney disease: Secondary | ICD-10-CM | POA: Diagnosis not present

## 2019-12-01 DIAGNOSIS — Z902 Acquired absence of lung [part of]: Secondary | ICD-10-CM | POA: Diagnosis not present

## 2019-12-01 DIAGNOSIS — R509 Fever, unspecified: Secondary | ICD-10-CM | POA: Diagnosis not present

## 2019-12-01 DIAGNOSIS — R05 Cough: Secondary | ICD-10-CM | POA: Diagnosis not present

## 2019-12-01 DIAGNOSIS — Q845 Enlarged and hypertrophic nails: Secondary | ICD-10-CM | POA: Diagnosis not present

## 2019-12-09 DIAGNOSIS — Y95 Nosocomial condition: Secondary | ICD-10-CM | POA: Diagnosis not present

## 2019-12-09 DIAGNOSIS — J189 Pneumonia, unspecified organism: Secondary | ICD-10-CM | POA: Diagnosis not present

## 2019-12-09 DIAGNOSIS — U071 COVID-19: Secondary | ICD-10-CM | POA: Diagnosis not present

## 2019-12-09 DIAGNOSIS — J1282 Pneumonia due to coronavirus disease 2019: Secondary | ICD-10-CM | POA: Diagnosis not present

## 2019-12-09 DIAGNOSIS — Z09 Encounter for follow-up examination after completed treatment for conditions other than malignant neoplasm: Secondary | ICD-10-CM | POA: Diagnosis not present

## 2019-12-09 DIAGNOSIS — Z9981 Dependence on supplemental oxygen: Secondary | ICD-10-CM | POA: Diagnosis not present

## 2019-12-09 DIAGNOSIS — N183 Chronic kidney disease, stage 3 unspecified: Secondary | ICD-10-CM | POA: Diagnosis not present

## 2019-12-16 DIAGNOSIS — T380X5A Adverse effect of glucocorticoids and synthetic analogues, initial encounter: Secondary | ICD-10-CM | POA: Diagnosis not present

## 2019-12-16 DIAGNOSIS — J9621 Acute and chronic respiratory failure with hypoxia: Secondary | ICD-10-CM | POA: Diagnosis not present

## 2019-12-16 DIAGNOSIS — E1151 Type 2 diabetes mellitus with diabetic peripheral angiopathy without gangrene: Secondary | ICD-10-CM | POA: Diagnosis not present

## 2019-12-16 DIAGNOSIS — Z20822 Contact with and (suspected) exposure to covid-19: Secondary | ICD-10-CM | POA: Diagnosis not present

## 2019-12-16 DIAGNOSIS — E1122 Type 2 diabetes mellitus with diabetic chronic kidney disease: Secondary | ICD-10-CM | POA: Diagnosis not present

## 2019-12-16 DIAGNOSIS — I2699 Other pulmonary embolism without acute cor pulmonale: Secondary | ICD-10-CM | POA: Diagnosis not present

## 2019-12-16 DIAGNOSIS — Z902 Acquired absence of lung [part of]: Secondary | ICD-10-CM | POA: Diagnosis not present

## 2019-12-16 DIAGNOSIS — I130 Hypertensive heart and chronic kidney disease with heart failure and stage 1 through stage 4 chronic kidney disease, or unspecified chronic kidney disease: Secondary | ICD-10-CM | POA: Diagnosis not present

## 2019-12-16 DIAGNOSIS — Z8701 Personal history of pneumonia (recurrent): Secondary | ICD-10-CM | POA: Diagnosis not present

## 2019-12-16 DIAGNOSIS — Z79899 Other long term (current) drug therapy: Secondary | ICD-10-CM | POA: Diagnosis not present

## 2019-12-16 DIAGNOSIS — I2510 Atherosclerotic heart disease of native coronary artery without angina pectoris: Secondary | ICD-10-CM | POA: Diagnosis not present

## 2019-12-16 DIAGNOSIS — J438 Other emphysema: Secondary | ICD-10-CM | POA: Diagnosis not present

## 2019-12-16 DIAGNOSIS — Z8616 Personal history of COVID-19: Secondary | ICD-10-CM | POA: Diagnosis not present

## 2019-12-16 DIAGNOSIS — I509 Heart failure, unspecified: Secondary | ICD-10-CM | POA: Diagnosis not present

## 2019-12-16 DIAGNOSIS — I4510 Unspecified right bundle-branch block: Secondary | ICD-10-CM | POA: Diagnosis not present

## 2019-12-16 DIAGNOSIS — R000 Tachycardia, unspecified: Secondary | ICD-10-CM | POA: Diagnosis not present

## 2019-12-16 DIAGNOSIS — N189 Chronic kidney disease, unspecified: Secondary | ICD-10-CM | POA: Diagnosis not present

## 2019-12-16 DIAGNOSIS — R0603 Acute respiratory distress: Secondary | ICD-10-CM | POA: Diagnosis not present

## 2019-12-16 DIAGNOSIS — Z7984 Long term (current) use of oral hypoglycemic drugs: Secondary | ICD-10-CM | POA: Diagnosis not present

## 2019-12-16 DIAGNOSIS — R1312 Dysphagia, oropharyngeal phase: Secondary | ICD-10-CM | POA: Diagnosis not present

## 2019-12-16 DIAGNOSIS — Z7982 Long term (current) use of aspirin: Secondary | ICD-10-CM | POA: Diagnosis not present

## 2019-12-16 DIAGNOSIS — R0902 Hypoxemia: Secondary | ICD-10-CM | POA: Diagnosis not present

## 2019-12-16 DIAGNOSIS — E785 Hyperlipidemia, unspecified: Secondary | ICD-10-CM | POA: Diagnosis not present

## 2019-12-16 DIAGNOSIS — R911 Solitary pulmonary nodule: Secondary | ICD-10-CM | POA: Diagnosis not present

## 2019-12-16 DIAGNOSIS — E1165 Type 2 diabetes mellitus with hyperglycemia: Secondary | ICD-10-CM | POA: Diagnosis not present

## 2019-12-16 DIAGNOSIS — Z7951 Long term (current) use of inhaled steroids: Secondary | ICD-10-CM | POA: Diagnosis not present

## 2019-12-16 DIAGNOSIS — Z87891 Personal history of nicotine dependence: Secondary | ICD-10-CM | POA: Diagnosis not present

## 2019-12-16 DIAGNOSIS — Z9981 Dependence on supplemental oxygen: Secondary | ICD-10-CM | POA: Diagnosis not present

## 2019-12-16 DIAGNOSIS — N179 Acute kidney failure, unspecified: Secondary | ICD-10-CM | POA: Diagnosis not present

## 2019-12-16 DIAGNOSIS — J439 Emphysema, unspecified: Secondary | ICD-10-CM | POA: Diagnosis not present

## 2019-12-22 DIAGNOSIS — R000 Tachycardia, unspecified: Secondary | ICD-10-CM | POA: Diagnosis not present

## 2019-12-22 DIAGNOSIS — R6889 Other general symptoms and signs: Secondary | ICD-10-CM | POA: Diagnosis not present

## 2019-12-22 DIAGNOSIS — J441 Chronic obstructive pulmonary disease with (acute) exacerbation: Secondary | ICD-10-CM | POA: Diagnosis not present

## 2019-12-22 DIAGNOSIS — Z09 Encounter for follow-up examination after completed treatment for conditions other than malignant neoplasm: Secondary | ICD-10-CM | POA: Diagnosis not present

## 2019-12-24 DIAGNOSIS — J189 Pneumonia, unspecified organism: Secondary | ICD-10-CM | POA: Diagnosis not present

## 2019-12-24 DIAGNOSIS — Y95 Nosocomial condition: Secondary | ICD-10-CM | POA: Diagnosis not present

## 2020-01-03 DIAGNOSIS — D62 Acute posthemorrhagic anemia: Secondary | ICD-10-CM | POA: Diagnosis not present

## 2020-01-06 ENCOUNTER — Other Ambulatory Visit: Payer: Self-pay

## 2020-01-06 ENCOUNTER — Ambulatory Visit: Payer: PPO | Admitting: Podiatry

## 2020-01-06 ENCOUNTER — Encounter: Payer: Self-pay | Admitting: Podiatry

## 2020-01-06 DIAGNOSIS — E1159 Type 2 diabetes mellitus with other circulatory complications: Secondary | ICD-10-CM | POA: Insufficient documentation

## 2020-01-06 DIAGNOSIS — B351 Tinea unguium: Secondary | ICD-10-CM | POA: Diagnosis not present

## 2020-01-06 DIAGNOSIS — M79675 Pain in left toe(s): Secondary | ICD-10-CM | POA: Diagnosis not present

## 2020-01-06 DIAGNOSIS — M79674 Pain in right toe(s): Secondary | ICD-10-CM | POA: Diagnosis not present

## 2020-01-06 NOTE — Progress Notes (Signed)
This patient returns to my office for at risk foot care.  This patient requires this care by a professional since this patient will be at risk due to having diabetes.  This patient is unable to cut nails himself since the patient cannot reach his nails.These nails are painful walking and wearing shoes.  This patient presents for at risk foot care today.  General Appearance  Alert, conversant and in no acute stress.  Vascular  Dorsalis pedis and posterior tibial  pulses are weakly/absent  palpable  bilaterally.  Capillary return is within normal limits  bilaterally. Cold feet  B/L. bilaterally.  Neurologic  Senn-Weinstein monofilament wire test within normal limits  bilaterally. Muscle power within normal limits bilaterally.  Nails Thick disfigured discolored nails with subungual debris  from hallux to fifth toes bilaterally. No evidence of bacterial infection or drainage bilaterally.  Orthopedic  No limitations of motion  feet .  No crepitus or effusions noted. Cavus foot type.  Hammer toes 2-5  B/L.  Skin  normotropic skin with no porokeratosis noted bilaterally.  No signs of infections or ulcers noted.     Onychomycosis  Pain in right toes  Pain in left toes  Consent was obtained for treatment procedures.   Mechanical debridement of nails 1-5  bilaterally performed with a nail nipper.  Filed with dremel without incident.    Return office visit    3 months                  Told patient to return for periodic foot care and evaluation due to potential at risk complications.   Gardiner Barefoot DPM

## 2020-04-13 ENCOUNTER — Ambulatory Visit: Payer: Medicare HMO | Admitting: Podiatry

## 2020-04-13 ENCOUNTER — Other Ambulatory Visit: Payer: Self-pay

## 2020-04-13 ENCOUNTER — Encounter: Payer: Self-pay | Admitting: Podiatry

## 2020-04-13 DIAGNOSIS — E1159 Type 2 diabetes mellitus with other circulatory complications: Secondary | ICD-10-CM | POA: Diagnosis not present

## 2020-04-13 DIAGNOSIS — B351 Tinea unguium: Secondary | ICD-10-CM

## 2020-04-13 DIAGNOSIS — M79674 Pain in right toe(s): Secondary | ICD-10-CM

## 2020-04-13 DIAGNOSIS — M79675 Pain in left toe(s): Secondary | ICD-10-CM

## 2020-04-13 NOTE — Progress Notes (Signed)
This patient returns to my office for at risk foot care.  This patient requires this care by a professional since this patient will be at risk due to having diabetes.  This patient is unable to cut nails himself since the patient cannot reach his nails.These nails are painful walking and wearing shoes.  This patient presents for at risk foot care today.  General Appearance  Alert, conversant and in no acute stress.  Vascular  Dorsalis pedis and posterior tibial  pulses are weakly/absent  palpable  bilaterally.  Capillary return is within normal limits  bilaterally. Cold feet  B/L. bilaterally.  Neurologic  Senn-Weinstein monofilament wire test within normal limits  bilaterally. Muscle power within normal limits bilaterally.  Nails Thick disfigured discolored nails with subungual debris  from hallux to fifth toes bilaterally. No evidence of bacterial infection or drainage bilaterally.  Orthopedic  No limitations of motion  feet .  No crepitus or effusions noted. Cavus foot type.  Hammer toes 2-5  B/L.  Skin  normotropic skin  noted bilaterally.  No signs of infections or ulcers noted. Asymptomatic callus sub 5th left foot.    Onychomycosis  Pain in right toes  Pain in left toes  Consent was obtained for treatment procedures.   Mechanical debridement of nails 1-5  bilaterally performed with a nail nipper.  Filed with dremel without incident.    Return office visit    3 months                  Told patient to return for periodic foot care and evaluation due to potential at risk complications.   Gardiner Barefoot DPM

## 2020-05-11 ENCOUNTER — Telehealth: Payer: Self-pay | Admitting: *Deleted

## 2020-05-11 NOTE — Telephone Encounter (Signed)
Noted patient is followed by pulmonary for management of CT chest findings. Not sure of plan for next follow up scan. Unable to see if patient is candidate for further screening due to co morbidities. Attempted to contact patient to discuss further and left voicemail for patient to return my call.

## 2020-05-26 ENCOUNTER — Other Ambulatory Visit: Payer: Self-pay | Admitting: *Deleted

## 2020-05-26 ENCOUNTER — Encounter: Payer: Self-pay | Admitting: *Deleted

## 2020-05-26 DIAGNOSIS — Z87891 Personal history of nicotine dependence: Secondary | ICD-10-CM

## 2020-05-26 DIAGNOSIS — Z122 Encounter for screening for malignant neoplasm of respiratory organs: Secondary | ICD-10-CM

## 2020-05-26 NOTE — Progress Notes (Signed)
Contacted and scheduled for annual lung screening scan. Patient is a former smoker with a 52 pack year history.

## 2020-06-02 ENCOUNTER — Other Ambulatory Visit: Payer: Self-pay

## 2020-06-02 ENCOUNTER — Ambulatory Visit
Admission: RE | Admit: 2020-06-02 | Discharge: 2020-06-02 | Disposition: A | Discharge status: Home / Self Care | Payer: Medicare HMO | Source: Ambulatory Visit | Attending: Oncology | Admitting: Oncology

## 2020-06-02 DIAGNOSIS — Z87891 Personal history of nicotine dependence: Secondary | ICD-10-CM | POA: Insufficient documentation

## 2020-06-02 DIAGNOSIS — Z122 Encounter for screening for malignant neoplasm of respiratory organs: Secondary | ICD-10-CM | POA: Diagnosis present

## 2020-06-07 ENCOUNTER — Telehealth: Payer: Self-pay | Admitting: *Deleted

## 2020-06-07 NOTE — Telephone Encounter (Signed)
After reviewing CT results with Dr. Lanney Gins, [facility], notified patient of LDCT lung cancer screening program results with recommendation for 3 to 6 month follow up imaging. Also notified of incidental findings noted below and is encouraged to discuss further with PCP who will receive a copy of this note and/or the CT report. Patient verbalizes understanding and agrees with this plan.    IMPRESSION: 1. Lung-RADS 4B, suspicious. Two new indistinct solid pulmonary nodules, largest 9.9 mm at the right lung base. Additional imaging evaluation or consultation with Pulmonology or Thoracic Surgery recommended. Consider attention on follow-up noncontrast chest CT in 3 months. 2. Left main and 3 vessel coronary atherosclerosis. 3. Aortic Atherosclerosis (ICD10-I70.0) and Emphysema (ICD10-J43.9).

## 2020-07-13 ENCOUNTER — Encounter: Payer: Self-pay | Admitting: Podiatry

## 2020-07-13 ENCOUNTER — Ambulatory Visit: Payer: Medicare HMO | Admitting: Podiatry

## 2020-07-13 ENCOUNTER — Other Ambulatory Visit: Payer: Self-pay

## 2020-07-13 DIAGNOSIS — B351 Tinea unguium: Secondary | ICD-10-CM

## 2020-07-13 DIAGNOSIS — M79675 Pain in left toe(s): Secondary | ICD-10-CM | POA: Diagnosis not present

## 2020-07-13 DIAGNOSIS — E1159 Type 2 diabetes mellitus with other circulatory complications: Secondary | ICD-10-CM

## 2020-07-13 DIAGNOSIS — M79674 Pain in right toe(s): Secondary | ICD-10-CM | POA: Diagnosis not present

## 2020-07-13 DIAGNOSIS — L84 Corns and callosities: Secondary | ICD-10-CM | POA: Diagnosis not present

## 2020-07-13 NOTE — Progress Notes (Signed)
This patient returns to my office for at risk foot care.  This patient requires this care by a professional since this patient will be at risk due to having diabetes.  This patient is unable to cut nails himself since the patient cannot reach his nails.These nails are painful walking and wearing shoes.  This patient presents for at risk foot care today.  General Appearance  Alert, conversant and in no acute stress.  Vascular  Dorsalis pedis and posterior tibial  pulses are weakly/absent  palpable  bilaterally.  Capillary return is within normal limits  bilaterally. Cold feet  B/L. bilaterally.  Neurologic  Senn-Weinstein monofilament wire test within normal limits  bilaterally. Muscle power within normal limits bilaterally.  Nails Thick disfigured discolored nails with subungual debris  from hallux to fifth toes bilaterally. No evidence of bacterial infection or drainage bilaterally.  Orthopedic  No limitations of motion  feet .  No crepitus or effusions noted. Cavus foot type.  Hammer toes 2-5  B/L.  Skin  normotropic skin  noted bilaterally.  No signs of infections or ulcers noted. Symptomatic callus sub 5th left foot.    Onychomycosis  Pain in right toes  Pain in left toes  Callus left forefoot.  Consent was obtained for treatment procedures.   Mechanical debridement of nails 1-5  bilaterally performed with a nail nipper.  Filed with dremel without incident. Debride callus with # 15 blade followed by dremel tool usage.   Return office visit    3 months                  Told patient to return for periodic foot care and evaluation due to potential at risk complications.   Gardiner Barefoot DPM

## 2020-07-29 ENCOUNTER — Other Ambulatory Visit: Payer: Self-pay

## 2020-07-29 ENCOUNTER — Emergency Department: Payer: Medicare HMO

## 2020-07-29 ENCOUNTER — Inpatient Hospital Stay
Admission: EM | Admit: 2020-07-29 | Discharge: 2020-08-02 | DRG: 177 | Disposition: A | Discharge status: Home / Self Care | Payer: Medicare HMO | Attending: Internal Medicine | Admitting: Internal Medicine

## 2020-07-29 DIAGNOSIS — E1151 Type 2 diabetes mellitus with diabetic peripheral angiopathy without gangrene: Secondary | ICD-10-CM | POA: Diagnosis present

## 2020-07-29 DIAGNOSIS — E1169 Type 2 diabetes mellitus with other specified complication: Secondary | ICD-10-CM | POA: Diagnosis present

## 2020-07-29 DIAGNOSIS — J9621 Acute and chronic respiratory failure with hypoxia: Secondary | ICD-10-CM | POA: Diagnosis present

## 2020-07-29 DIAGNOSIS — I2510 Atherosclerotic heart disease of native coronary artery without angina pectoris: Secondary | ICD-10-CM | POA: Diagnosis present

## 2020-07-29 DIAGNOSIS — U071 COVID-19: Principal | ICD-10-CM | POA: Diagnosis present

## 2020-07-29 DIAGNOSIS — J1282 Pneumonia due to coronavirus disease 2019: Secondary | ICD-10-CM | POA: Diagnosis present

## 2020-07-29 DIAGNOSIS — E1122 Type 2 diabetes mellitus with diabetic chronic kidney disease: Secondary | ICD-10-CM | POA: Diagnosis present

## 2020-07-29 DIAGNOSIS — Z9981 Dependence on supplemental oxygen: Secondary | ICD-10-CM

## 2020-07-29 DIAGNOSIS — J9620 Acute and chronic respiratory failure, unspecified whether with hypoxia or hypercapnia: Secondary | ICD-10-CM | POA: Diagnosis present

## 2020-07-29 DIAGNOSIS — Z79899 Other long term (current) drug therapy: Secondary | ICD-10-CM

## 2020-07-29 DIAGNOSIS — Z7982 Long term (current) use of aspirin: Secondary | ICD-10-CM

## 2020-07-29 DIAGNOSIS — I10 Essential (primary) hypertension: Secondary | ICD-10-CM | POA: Diagnosis present

## 2020-07-29 DIAGNOSIS — Z8701 Personal history of pneumonia (recurrent): Secondary | ICD-10-CM

## 2020-07-29 DIAGNOSIS — N1831 Chronic kidney disease, stage 3a: Secondary | ICD-10-CM | POA: Diagnosis present

## 2020-07-29 DIAGNOSIS — E785 Hyperlipidemia, unspecified: Secondary | ICD-10-CM | POA: Diagnosis present

## 2020-07-29 DIAGNOSIS — N183 Chronic kidney disease, stage 3 unspecified: Secondary | ICD-10-CM | POA: Diagnosis present

## 2020-07-29 DIAGNOSIS — G4733 Obstructive sleep apnea (adult) (pediatric): Secondary | ICD-10-CM | POA: Diagnosis present

## 2020-07-29 DIAGNOSIS — Z87891 Personal history of nicotine dependence: Secondary | ICD-10-CM

## 2020-07-29 DIAGNOSIS — R531 Weakness: Secondary | ICD-10-CM

## 2020-07-29 DIAGNOSIS — R112 Nausea with vomiting, unspecified: Secondary | ICD-10-CM

## 2020-07-29 DIAGNOSIS — R0602 Shortness of breath: Secondary | ICD-10-CM

## 2020-07-29 DIAGNOSIS — E1159 Type 2 diabetes mellitus with other circulatory complications: Secondary | ICD-10-CM | POA: Diagnosis present

## 2020-07-29 DIAGNOSIS — Z7984 Long term (current) use of oral hypoglycemic drugs: Secondary | ICD-10-CM

## 2020-07-29 DIAGNOSIS — J440 Chronic obstructive pulmonary disease with acute lower respiratory infection: Secondary | ICD-10-CM | POA: Diagnosis present

## 2020-07-29 DIAGNOSIS — Z8249 Family history of ischemic heart disease and other diseases of the circulatory system: Secondary | ICD-10-CM

## 2020-07-29 DIAGNOSIS — I152 Hypertension secondary to endocrine disorders: Secondary | ICD-10-CM | POA: Diagnosis present

## 2020-07-29 LAB — CBC WITH DIFFERENTIAL/PLATELET
Abs Immature Granulocytes: 0.07 10*3/uL (ref 0.00–0.07)
Basophils Absolute: 0.1 10*3/uL (ref 0.0–0.1)
Basophils Relative: 0 %
Eosinophils Absolute: 0.1 10*3/uL (ref 0.0–0.5)
Eosinophils Relative: 1 %
HCT: 41.3 % (ref 39.0–52.0)
Hemoglobin: 13 g/dL (ref 13.0–17.0)
Immature Granulocytes: 1 %
Lymphocytes Relative: 7 %
Lymphs Abs: 1 10*3/uL (ref 0.7–4.0)
MCH: 27 pg (ref 26.0–34.0)
MCHC: 31.5 g/dL (ref 30.0–36.0)
MCV: 85.9 fL (ref 80.0–100.0)
Monocytes Absolute: 1.2 10*3/uL — ABNORMAL HIGH (ref 0.1–1.0)
Monocytes Relative: 9 %
Neutro Abs: 11.5 10*3/uL — ABNORMAL HIGH (ref 1.7–7.7)
Neutrophils Relative %: 82 %
Platelets: 254 10*3/uL (ref 150–400)
RBC: 4.81 MIL/uL (ref 4.22–5.81)
RDW: 15.8 % — ABNORMAL HIGH (ref 11.5–15.5)
WBC: 13.9 10*3/uL — ABNORMAL HIGH (ref 4.0–10.5)
nRBC: 0 % (ref 0.0–0.2)

## 2020-07-29 LAB — COMPREHENSIVE METABOLIC PANEL
ALT: 17 U/L (ref 0–44)
AST: 17 U/L (ref 15–41)
Albumin: 3.8 g/dL (ref 3.5–5.0)
Alkaline Phosphatase: 81 U/L (ref 38–126)
Anion gap: 12 (ref 5–15)
BUN: 19 mg/dL (ref 8–23)
CO2: 21 mmol/L — ABNORMAL LOW (ref 22–32)
Calcium: 9 mg/dL (ref 8.9–10.3)
Chloride: 105 mmol/L (ref 98–111)
Creatinine, Ser: 1.3 mg/dL — ABNORMAL HIGH (ref 0.61–1.24)
GFR, Estimated: 59 mL/min — ABNORMAL LOW (ref >60)
Glucose, Bld: 177 mg/dL — ABNORMAL HIGH (ref 70–99)
Potassium: 3.6 mmol/L (ref 3.5–5.1)
Sodium: 138 mmol/L (ref 135–145)
Total Bilirubin: 1.1 mg/dL (ref 0.3–1.2)
Total Protein: 7.4 g/dL (ref 6.5–8.1)

## 2020-07-29 LAB — RESP PANEL BY RT-PCR (FLU A&B, COVID) ARPGX2
Influenza A by PCR: NEGATIVE
Influenza B by PCR: NEGATIVE
SARS Coronavirus 2 by RT PCR: POSITIVE — AB

## 2020-07-29 LAB — TROPONIN I (HIGH SENSITIVITY): Troponin I (High Sensitivity): 6 ng/L (ref <18)

## 2020-07-29 MED ORDER — SENNOSIDES-DOCUSATE SODIUM 8.6-50 MG PO TABS: 1.0000 | ORAL_TABLET | Freq: Every evening | ORAL | Status: DC | PRN

## 2020-07-29 MED ORDER — HYDROCOD POLST-CPM POLST ER 10-8 MG/5ML PO SUER: 5.0000 mL | Freq: Two times a day (BID) | ORAL | Status: DC | PRN

## 2020-07-29 MED ORDER — ACETAMINOPHEN 325 MG PO TABS: 650.0000 mg | ORAL_TABLET | Freq: Four times a day (QID) | ORAL | Status: DC | PRN

## 2020-07-29 MED ORDER — SODIUM CHLORIDE 0.9 % IV BOLUS
1000.0000 mL | Freq: Once | INTRAVENOUS | Status: AC
  Administered 2020-07-29: 1000 mL via INTRAVENOUS

## 2020-07-29 MED ORDER — ENOXAPARIN SODIUM 40 MG/0.4ML IJ SOSY
40.0000 mg | PREFILLED_SYRINGE | INTRAMUSCULAR | Status: DC
  Administered 2020-07-30 – 2020-08-01 (×4): 40 mg via SUBCUTANEOUS
  Filled 2020-07-29 (×4): qty 0.4

## 2020-07-29 MED ORDER — ROSUVASTATIN CALCIUM 20 MG PO TABS
40.0000 mg | ORAL_TABLET | Freq: Every day | ORAL | Status: DC
  Administered 2020-07-30 – 2020-07-31 (×3): 40 mg via ORAL
  Filled 2020-07-29 (×3): qty 2

## 2020-07-29 MED ORDER — SODIUM CHLORIDE 0.9 % IV SOLN
200.0000 mg | Freq: Once | INTRAVENOUS | Status: AC
  Administered 2020-07-29: 200 mg via INTRAVENOUS
  Filled 2020-07-29: qty 200

## 2020-07-29 MED ORDER — INSULIN ASPART 100 UNIT/ML IJ SOLN
0.0000 [IU] | Freq: Three times a day (TID) | INTRAMUSCULAR | Status: DC
  Administered 2020-07-30: 2 [IU] via SUBCUTANEOUS
  Administered 2020-07-30 (×2): 3 [IU] via SUBCUTANEOUS
  Administered 2020-07-31 – 2020-08-01 (×6): 2 [IU] via SUBCUTANEOUS
  Administered 2020-08-02: 09:00:00 3 [IU] via SUBCUTANEOUS
  Administered 2020-08-02: 12:00:00 2 [IU] via SUBCUTANEOUS
  Administered 2020-08-02: 18:00:00 7 [IU] via SUBCUTANEOUS
  Filled 2020-07-29 (×11): qty 1

## 2020-07-29 MED ORDER — METHYLPREDNISOLONE SODIUM SUCC 40 MG IJ SOLR
40.0000 mg | Freq: Two times a day (BID) | INTRAMUSCULAR | Status: DC
  Administered 2020-07-30 – 2020-08-02 (×8): 40 mg via INTRAVENOUS
  Filled 2020-07-29 (×8): qty 1

## 2020-07-29 MED ORDER — ALBUTEROL SULFATE HFA 108 (90 BASE) MCG/ACT IN AERS
1.0000 | INHALATION_SPRAY | RESPIRATORY_TRACT | Status: DC | PRN
  Administered 2020-07-30: 01:00:00 2 via RESPIRATORY_TRACT
  Filled 2020-07-29: qty 6.7

## 2020-07-29 MED ORDER — ONDANSETRON HCL 4 MG/2ML IJ SOLN: 4.0000 mg | Freq: Four times a day (QID) | INTRAMUSCULAR | Status: DC | PRN

## 2020-07-29 MED ORDER — MONTELUKAST SODIUM 10 MG PO TABS
10.0000 mg | ORAL_TABLET | Freq: Every day | ORAL | Status: DC
  Administered 2020-07-30 – 2020-08-01 (×4): 10 mg via ORAL
  Filled 2020-07-29 (×5): qty 1

## 2020-07-29 MED ORDER — ASPIRIN EC 81 MG PO TBEC
81.0000 mg | DELAYED_RELEASE_TABLET | Freq: Every evening | ORAL | Status: DC
  Administered 2020-07-30 – 2020-08-02 (×5): 81 mg via ORAL
  Filled 2020-07-29 (×5): qty 1

## 2020-07-29 MED ORDER — ZINC SULFATE 220 (50 ZN) MG PO CAPS
220.0000 mg | ORAL_CAPSULE | Freq: Every day | ORAL | Status: DC
  Administered 2020-07-30 – 2020-08-02 (×5): 220 mg via ORAL
  Filled 2020-07-29 (×5): qty 1

## 2020-07-29 MED ORDER — GUAIFENESIN-DM 100-10 MG/5ML PO SYRP
10.0000 mL | ORAL_SOLUTION | ORAL | Status: DC | PRN
  Administered 2020-07-30: 10 mL via ORAL
  Filled 2020-07-29: qty 10

## 2020-07-29 MED ORDER — IPRATROPIUM-ALBUTEROL 20-100 MCG/ACT IN AERS
1.0000 | INHALATION_SPRAY | Freq: Four times a day (QID) | RESPIRATORY_TRACT | Status: DC
  Administered 2020-07-30 – 2020-08-02 (×13): 1 via RESPIRATORY_TRACT
  Filled 2020-07-29: qty 4

## 2020-07-29 MED ORDER — ASCORBIC ACID 500 MG PO TABS
500.0000 mg | ORAL_TABLET | Freq: Every day | ORAL | Status: DC
  Administered 2020-07-30 – 2020-08-02 (×5): 500 mg via ORAL
  Filled 2020-07-29 (×5): qty 1

## 2020-07-29 MED ORDER — ONDANSETRON HCL 4 MG PO TABS: 4.0000 mg | ORAL_TABLET | Freq: Four times a day (QID) | ORAL | Status: DC | PRN

## 2020-07-29 MED ORDER — METOPROLOL TARTRATE 50 MG PO TABS
50.0000 mg | ORAL_TABLET | Freq: Two times a day (BID) | ORAL | Status: DC
  Administered 2020-07-30: 01:00:00 50 mg via ORAL
  Filled 2020-07-29 (×2): qty 1

## 2020-07-29 MED ORDER — MOMETASONE FURO-FORMOTEROL FUM 200-5 MCG/ACT IN AERO
2.0000 | INHALATION_SPRAY | Freq: Two times a day (BID) | RESPIRATORY_TRACT | Status: DC
  Administered 2020-07-30 – 2020-08-02 (×8): 2 via RESPIRATORY_TRACT
  Filled 2020-07-29: qty 8.8

## 2020-07-29 MED ORDER — SODIUM CHLORIDE 0.9 % IV SOLN
100.0000 mg | Freq: Every day | INTRAVENOUS | Status: DC
  Administered 2020-07-30 – 2020-08-01 (×3): 100 mg via INTRAVENOUS
  Filled 2020-07-29 (×6): qty 20

## 2020-07-29 MED ORDER — INSULIN ASPART 100 UNIT/ML IJ SOLN
0.0000 [IU] | Freq: Every day | INTRAMUSCULAR | Status: DC
  Administered 2020-07-30: 2 [IU] via SUBCUTANEOUS
  Administered 2020-07-30: 4 [IU] via SUBCUTANEOUS
  Administered 2020-07-31 – 2020-08-01 (×2): 3 [IU] via SUBCUTANEOUS
  Filled 2020-07-29 (×4): qty 1

## 2020-07-29 MED ORDER — ONDANSETRON HCL 4 MG/2ML IJ SOLN
4.0000 mg | Freq: Once | INTRAMUSCULAR | Status: AC
  Administered 2020-07-29: 4 mg via INTRAVENOUS
  Filled 2020-07-29: qty 2

## 2020-07-29 NOTE — ED Provider Notes (Signed)
[facility] Emergency Department Provider Note  Time seen: 5:34 PM  I have reviewed the triage vital signs and the nursing notes.   HISTORY  Chief Complaint Shortness of Breath and Vomiting   HPI Bradley [name] is a 70 y.o. male with a past medical history of COPD, CAD, diabetes, hypertension, presents to the emergency department for shortness of breath nausea vomiting and weakness.  According to the patient he has COPD wears 2 L of oxygen 24/7.  States slightly more short of breath today but states bigger concern today is he is feeling weak fatigued and nauseated which is started before lunch today.  Denies any focal weakness or headache.  States he feels cold/chills as well.  No chest pain.  No abdominal pain.   Past Medical History:  Diagnosis Date  . COPD (chronic obstructive pulmonary disease) ([place])   . Coronary artery disease   . Diabetes mellitus without complication ([place])   . Hypertension     Patient Active Problem List   Diagnosis Date Noted  . Callus 07/13/2020  . Pain due to onychomycosis of toenails of both feet 01/06/2020  . Type 2 diabetes mellitus with vascular disease ([place]) 01/06/2020  . Chronic renal failure, stage 2 (mild) 12/01/2019  . Lightheadedness 11/16/2019  . Acute kidney injury ([place]) 04/20/2019  . PAD (peripheral artery disease) ([place]) 04/20/2019  . Tachycardia 04/20/2019  . B12 deficiency 12/07/2017  . OSA (obstructive sleep apnea) 11/28/2017  . Lower extremity pain, left 03/06/2017  . Numbness and tingling of left leg 03/06/2017  . Coronary artery disease involving native coronary artery of native heart without angina pectoris 02/18/2017  . Diastasis recti 02/18/2017  . CVD (cardiovascular disease) 02/18/2017  . Need for vaccination 02/18/2017  . Umbilical hernia without obstruction and without gangrene 02/18/2017  . Hemoglobinuria 08/21/2016  . Anemia 08/07/2016  . Pneumonia 07/15/2016  . Lung mass 06/28/2016  . History of  cigarette smoking 06/19/2016  . Screening for malignant neoplasm of respiratory organ 06/19/2016  . Acute respiratory failure with hypoxia ([place]) 12/27/2015  . Community acquired pneumonia 12/27/2015  . Acute respiratory distress 09/19/2015  . Adjustment disorder with mixed anxiety and depressed mood 04/14/2015  . Insomnia 04/14/2015  . Grief 04/14/2015  . Hyponatremia 04/12/2015  . COPD exacerbation ([place]) 10/08/2014  . CAP (community acquired pneumonia) 10/08/2014  . Hypogammaglobulinemia ([place]) 08/17/2014  . Leukocytosis 08/03/2014  . Recurrent infections 08/03/2014  . Tinnitus of right ear 03/03/2014  . Left carotid artery stenosis 07/21/2013  . HLD (hyperlipidemia) 10/05/2010  . Onychomycosis 10/05/2010    Past Surgical History:  Procedure Laterality Date  . CAROTID STENT Left   . LEFT HEART CATH AND CORONARY ANGIOGRAPHY Left 11/02/2019   Procedure: LEFT HEART CATH AND CORONARY ANGIOGRAPHY;  Surgeon: Teodoro Spray, MD;  Location: [facility];  Service: Cardiovascular;  Laterality: Left;  . LUNG BIOPSY Right    2006  . right side partial lobe removed       Prior to Admission medications   Medication Sig Start Date End Date Taking? Authorizing Provider  Albuterol Sulfate (PROAIR RESPICLICK) 244 (90 Base) MCG/ACT AEPB INHALE 1 PUFF BY MOUTH EVERY 4 HOURS AS NEEDED FOR WHEEZING 04/13/20   [provider]  aspirin EC 81 MG tablet Take 81 mg by mouth every evening.     [provider]  dextromethorphan-guaiFENesin (ROBITUSSIN-DM) 10-100 MG/5ML liquid Take by mouth. 11/21/19   [provider]  famotidine (PEPCID) 20 MG tablet  02/17/20  [provider]  Fluticasone-Salmeterol (ADVAIR) 250-50 MCG/DOSE AEPB Inhale 1 puff into the lungs 2 (two) times daily. 09/20/15   Hillary Bow, MD  furosemide (LASIX) 20 MG tablet Take by mouth. 11/21/19   [provider]  glucose blood test strip Use once daily. Use as instructed. DX: E11.9  10/08/16   [provider]  levalbuterol Penne Lash) 0.31 MG/3ML nebulizer solution Inhale into the lungs. 12/22/19 12/21/20  [provider]  metFORMIN (GLUCOPHAGE) 500 MG tablet Take by mouth. 11/21/19 10/10/20  [provider]  metoprolol tartrate (LOPRESSOR) 50 MG tablet Take by mouth. 11/21/19   [provider]  montelukast (SINGULAIR) 10 MG tablet Take by mouth. 11/21/19 11/20/20  [provider]  nitroGLYCERIN (NITROSTAT) 0.4 MG SL tablet Place 0.4 mg under the tongue every 5 (five) minutes x 3 doses as needed for chest pain.     [provider]  polyethylene glycol (MIRALAX / GLYCOLAX) 17 g packet Take 17 g by mouth daily.    [provider]  rosuvastatin (CRESTOR) 40 MG tablet Take 40 mg by mouth daily.  08/05/19   [provider]  sildenafil (REVATIO) 20 MG tablet Take 20 mg by mouth 3 (three) times daily.    [provider]    No Known Allergies  Family History  Problem Relation Age of Onset  . Cancer Mother   . Heart attack Father   . Cancer Sister     Social History Social History   Tobacco Use  . Smoking status: Former Smoker    Packs/day: 1.00    Years: 52.00    Pack years: 52.00    Quit date: 06/18/2016    Years since quitting: 4.1  . Smokeless tobacco: Never Used  Vaping Use  . Vaping Use: Never used  Substance Use Topics  . Alcohol use: Yes    Alcohol/week: 4.0 standard drinks    Types: 4 Shots of liquor per week    Comment: has rum every other day  . Drug use: No    Review of Systems Constitutional: Negative for fever, positive for chills. Cardiovascular: Negative for chest pain. Respiratory: Negative for shortness of breath. Gastrointestinal: Negative for abdominal pain.  Positive nausea vomiting.  Positive for 1 episode of loose stool. Genitourinary: Negative for urinary compaints Musculoskeletal: Negative for musculoskeletal complaints Neurological: Negative for headache All  other ROS negative  ____________________________________________   PHYSICAL EXAM:  VITAL SIGNS: ED Triage Vitals  Enc Vitals Group     BP 07/29/20 1714 (!) 141/72     Pulse Rate 07/29/20 1714 96     Resp 07/29/20 1714 (!) 23     Temp 07/29/20 1714 98.5 F (36.9 C)     Temp Source 07/29/20 1714 Oral     SpO2 07/29/20 1714 93 %     Weight 07/29/20 1712 155 lb (70.3 kg)     Height 07/29/20 1712 5\' 7"  (1.702 m)     Head Circumference --      Peak Flow --      Pain Score 07/29/20 1711 0     Pain Loc --      Pain Edu? --      Excl. in [place]? --    Constitutional: Alert and oriented. Well appearing and in no distress. Eyes: Normal exam ENT      Head: Normocephalic and atraumatic.      Mouth/Throat: Mucous membranes are moist. Cardiovascular: Normal rate, regular rhythm.  Respiratory: Normal respiratory effort without tachypnea nor  retractions. Breath sounds are clear  Gastrointestinal: Soft and nontender. No distention.   Musculoskeletal: Nontender with normal range of motion in all extremities. No lower extremity tenderness or edema. Neurologic:  Normal speech and language. No gross focal neurologic deficits are appreciated. Skin:  Skin is warm, dry and intact.  Psychiatric: Mood and affect are normal. Speech and behavior are normal.   ____________________________________________    EKG  EKG viewed and interpreted by myself shows normal sinus rhythm at 94 bpm with a slightly widened QRS, normal axis, normal intervals, no concerning ST changes.  ____________________________________________    RADIOLOGY  Chest x-ray shows new superimposed opacity in the left base.  ____________________________________________   INITIAL IMPRESSION / ASSESSMENT AND PLAN / ED COURSE  Pertinent labs & imaging results that were available during my care of the patient were reviewed by me and considered in my medical decision making (see chart for details).   Patient presents emergency  department for weakness, nausea and somewhat worsening shortness of breath.  Patient has chronic shortness of breath at baseline.  Overall the patient appears well, no distress.  Patient denies any acute shortness of breath currently.  States he feels chills and nausea though.  We will check labs including blood work, urinalysis, chest x-ray, COVID swab.  We will IV hydrate treat with Zofran and continue to monitor while awaiting results.  Patient's work-up has resulted showing COVID-positive.  The remainder of the work-up is largely nonrevealing besides a chest x-ray showing new infiltrate.  Patient is now on 4 L of oxygen maintaining sats around 91 to 92%.  Baseline O2 is 2 L.  Given the infiltrates worsening shortness of breath nausea vomiting and increased O2 requirement we will admit the patient to the hospitalist service for further work-up and treatment.  Patient agreeable to plan of care.  Received Solu-Medrol by EMS we will order remdesivir.  Bradley [name] was evaluated in Emergency Department on 07/29/2020 for the symptoms described in the history of present illness. He was evaluated in the context of the global COVID-19 pandemic, which necessitated consideration that the patient might be at risk for infection with the SARS-CoV-2 virus that causes COVID-19. Institutional protocols and algorithms that pertain to the evaluation of patients at risk for COVID-19 are in a state of rapid change based on information released by regulatory bodies including the CDC and federal and state organizations. These policies and algorithms were followed during the patient's care in the ED.  ____________________________________________   FINAL CLINICAL IMPRESSION(S) / ED DIAGNOSES  Nausea and vomiting Weakness COVID-19   Harvest Dark, MD 07/29/20 1949

## 2020-07-29 NOTE — Consult Note (Signed)
Remdesivir - Pharmacy Brief Note   O:  ALT: 17 CXR: "Emphysematous changes with chronic scarring. Some new superimposed left basilar opacity is seen. This may represent some acute on chronic infiltrate." SpO2: 93% on 4L   A/P:  Remdesivir 200 mg IVPB once followed by 100 mg IVPB daily x 4 days.   Lorna Dibble, [facility] 07/29/2020 8:19 PM

## 2020-07-29 NOTE — H&P (Signed)
History and Physical    VEGAS FRITZE KVQ:[id number] DOB: [birth year] DOA: 07/29/2020  PCP: Valera Castle, MD  Patient coming from: Home via EMS  I have personally briefly reviewed patient's old medical records in [place]  Chief Complaint: Nausea, vomiting, shortness of breath, generalized weakness  HPI: Bradley [name] is a 70 y.o. male with medical history significant for COPD, chronic respiratory failure with hypoxia on 2 L supplemental O2 via [place], CAD, CKD stage IIIa, PAD, T2DM, HTN, HLD who presents to the ED for evaluation of generalized weakness, shortness of breath.  Patient states developing new onset of worsening shortness of breath from his baseline, cough productive of thick white sputum, chest congestion, runny nose, nausea with dry heaves, chills, and generalized weakness beginning around 10 AM this morning.  He says this felt similar to the symptoms he experienced when he had COVID-19 in November 2021.  He was hospitalized at [facility] at that time and treated with Decadron and remdesivir.  He had subsequent hospitalizations for pneumonia and COPD exacerbations again in November and then in October.  He has been on 2 L supplemental O2 at all times but is now requiring 4 L via [place].  He has not had any chest pain, abdominal pain, dysuria, or diarrhea.  He has received previous COVID-19 vaccinations (1 J&J vaccine in April 2021, and 2 Pfizer vaccinations most recently April 2022).  He is a former smoker, quit in 2017.  ED Course:  Initial vitals showed BP 141/72, pulse 95, RR 23, temp 98.5 F, SPO2 initially 93% on 2 L supplemental O2 via [place].  While in the ED, patient had increased O2 requirement of 4 L O2 via  to maintain SPO2 around 92%.  Labs show WBC 13.9, hemoglobin 13.0, platelets 254,000, sodium 138, potassium 3.6, bicarb 21, BUN 19, creatinine 1.30, serum glucose 177, LFTs within normal limits, high-sensitivity troponin I 6.  SARS-CoV-2 PCR is positive.  Influenza  A/B PCR's are negative.  Portable chest x-ray shows emphysematous changes with chronic scarring with some new superimposed left basilar opacity.  Patient was given 1 L normal saline, IV Zofran, and started on IV remdesivir.  The hospitalist service was consulted to admit for further evaluation and management.  Review of Systems: All systems reviewed and are negative except as documented in history of present illness above.   Past Medical History:  Diagnosis Date  . COPD (chronic obstructive pulmonary disease) ([place])   . Coronary artery disease   . Diabetes mellitus without complication ([place])   . Hypertension     Past Surgical History:  Procedure Laterality Date  . CAROTID STENT Left   . LEFT HEART CATH AND CORONARY ANGIOGRAPHY Left 11/02/2019   Procedure: LEFT HEART CATH AND CORONARY ANGIOGRAPHY;  Surgeon: Teodoro Spray, MD;  Location: [facility];  Service: Cardiovascular;  Laterality: Left;  . LUNG BIOPSY Right    2006  . right side partial lobe removed       Social History:  reports that he quit smoking about 4 years ago. He has a 52.00 pack-year smoking history. He has never used smokeless tobacco. He reports current alcohol use of about 4.0 standard drinks of alcohol per week. He reports that he does not use drugs.  No Known Allergies  Family History  Problem Relation Age of Onset  . Cancer Mother   . Heart attack Father   . Cancer Sister      Prior to Admission medications  Medication Sig Start Date End Date Taking? Authorizing Provider  Albuterol Sulfate (PROAIR RESPICLICK) 017 (90 Base) MCG/ACT AEPB INHALE 1 PUFF BY MOUTH EVERY 4 HOURS AS NEEDED FOR WHEEZING 04/13/20   [provider]  aspirin EC 81 MG tablet Take 81 mg by mouth every evening.     [provider]  dextromethorphan-guaiFENesin (ROBITUSSIN-DM) 10-100 MG/5ML liquid Take by mouth. 11/21/19   [provider]  famotidine (PEPCID) 20 MG tablet  02/17/20   [provider]  Fluticasone-Salmeterol (ADVAIR) 250-50 MCG/DOSE AEPB Inhale 1 puff into the lungs 2 (two) times daily. 09/20/15   Hillary Bow, MD  furosemide (LASIX) 20 MG tablet Take by mouth. 11/21/19   [provider]  glucose blood test strip Use once daily. Use as instructed. DX: E11.9 10/08/16   [provider]  levalbuterol Penne Lash) 0.31 MG/3ML nebulizer solution Inhale into the lungs. 12/22/19 12/21/20  [provider]  metFORMIN (GLUCOPHAGE) 500 MG tablet Take by mouth. 11/21/19 10/10/20  [provider]  metoprolol tartrate (LOPRESSOR) 50 MG tablet Take by mouth. 11/21/19   [provider]  montelukast (SINGULAIR) 10 MG tablet Take by mouth. 11/21/19 11/20/20  [provider]  nitroGLYCERIN (NITROSTAT) 0.4 MG SL tablet Place 0.4 mg under the tongue every 5 (five) minutes x 3 doses as needed for chest pain.     [provider]  polyethylene glycol (MIRALAX / GLYCOLAX) 17 g packet Take 17 g by mouth daily.    [provider]  rosuvastatin (CRESTOR) 40 MG tablet Take 40 mg by mouth daily.  08/05/19   [provider]  sildenafil (REVATIO) 20 MG tablet Take 20 mg by mouth 3 (three) times daily.    [provider]    Physical Exam: Vitals:   07/29/20 1714 07/29/20 1730 07/29/20 1900 07/29/20 1959  BP: (!) 141/72 (!) 148/72 124/62   Pulse: 96 97 (!) 104 (!) 107  Resp: (!) 23 (!) 24 (!) 25 (!) 23  Temp: 98.5 F (36.9 C)     TempSrc: Oral     SpO2: 93% 91% 96% 93%  Weight:      Height:       Constitutional: Resting in bed with head elevated, NAD, calm, comfortable Eyes: PERRL, lids and conjunctivae normal ENMT: Mucous membranes are moist. Posterior pharynx clear of any exudate or lesions.Normal dentition.  Neck: normal, supple, no masses. Respiratory: Distant breath sounds, clear to auscultation bilaterally, no wheezing, no crackles.  Wearing 2 L O2 via [place].  No accessory muscle use.  Cardiovascular:  Regular rate and rhythm, no murmurs / rubs / gallops. No extremity edema. 2+ pedal pulses. Abdomen: no tenderness, no masses palpated. No hepatosplenomegaly. Bowel sounds positive.  Musculoskeletal: no clubbing / cyanosis. No joint deformity upper and lower extremities. Good ROM, no contractures. Normal muscle tone.  Skin: no rashes, lesions, ulcers. No induration Neurologic: CN 2-12 grossly intact. Sensation intact. Strength 5/5 in all 4.  Psychiatric: Normal judgment and insight. Alert and oriented x 3. Normal mood.   Labs on Admission: I have personally reviewed following labs and imaging studies  CBC: Recent Labs  Lab 07/29/20 1715  WBC 13.9*  NEUTROABS 11.5*  HGB 13.0  HCT 41.3  MCV 85.9  PLT 510   Basic Metabolic Panel: Recent Labs  Lab 07/29/20 1715  NA 138  K 3.6  CL 105  CO2 21*  GLUCOSE 177*  BUN 19  CREATININE 1.30*  CALCIUM 9.0   GFR: Estimated Creatinine Clearance: 49.4  mL/min (A) (by C-G formula based on SCr of 1.3 mg/dL (H)). Liver Function Tests: Recent Labs  Lab 07/29/20 1715  AST 17  ALT 17  ALKPHOS 81  BILITOT 1.1  PROT 7.4  ALBUMIN 3.8   No results for input(s): LIPASE, AMYLASE in the last 168 hours. No results for input(s): AMMONIA in the last 168 hours. Coagulation Profile: No results for input(s): INR, PROTIME in the last 168 hours. Cardiac Enzymes: No results for input(s): CKTOTAL, CKMB, CKMBINDEX, TROPONINI in the last 168 hours. BNP (last 3 results) No results for input(s): PROBNP in the last 8760 hours. HbA1C: No results for input(s): HGBA1C in the last 72 hours. CBG: No results for input(s): GLUCAP in the last 168 hours. Lipid Profile: No results for input(s): CHOL, HDL, LDLCALC, TRIG, CHOLHDL, LDLDIRECT in the last 72 hours. Thyroid Function Tests: No results for input(s): TSH, T4TOTAL, FREET4, T3FREE, THYROIDAB in the last 72 hours. Anemia Panel: No results for input(s): VITAMINB12, FOLATE, FERRITIN, TIBC, IRON, RETICCTPCT  in the last 72 hours. Urine analysis:    Component Value Date/Time   COLORURINE YELLOW (A) 08/22/2017 1337   APPEARANCEUR CLEAR (A) 08/22/2017 1337   LABSPEC 1.013 08/22/2017 1337   PHURINE 5.0 08/22/2017 1337   GLUCOSEU NEGATIVE 08/22/2017 1337   HGBUR SMALL (A) 08/22/2017 1337   BILIRUBINUR NEGATIVE 08/22/2017 1337   KETONESUR 20 (A) 08/22/2017 1337   PROTEINUR NEGATIVE 08/22/2017 1337   NITRITE NEGATIVE 08/22/2017 1337   LEUKOCYTESUR NEGATIVE 08/22/2017 1337    Radiological Exams on Admission: DG Chest Portable 1 View  Result Date: 07/29/2020 CLINICAL DATA:  Shortness of breath and weakness EXAM: PORTABLE CHEST 1 VIEW COMPARISON:  06/02/20 FINDINGS: Cardiac shadow is within normal limits. Emphysematous changes are seen with diffuse scarring bilaterally. Some mild superimposed opacities are noted in the left lung base no pneumothorax is seen. No other focal abnormality is noted. IMPRESSION: Emphysematous changes with chronic scarring. Some new superimposed left basilar opacity is seen. This may represent some acute on chronic infiltrate. Electronically Signed   By: Inez Catalina M.D.   On: 07/29/2020 17:40    EKG: Personally reviewed.  Sinus rhythm with RBBB, similar to prior.  Assessment/Plan Principal Problem:   Acute on chronic respiratory failure with hypoxia (HCC) Active Problems:   Type 2 diabetes mellitus with vascular disease (HCC)   Coronary artery disease involving native coronary artery of native heart without angina pectoris   CKD (chronic kidney disease) stage 3, GFR 30-59 ml/min (HCC)   Hypertension associated with diabetes ([place])   Hyperlipidemia associated with type 2 diabetes mellitus ([place])   COVID-19 virus infection   Bradley [name] is a 70 y.o. male with medical history significant for COPD, chronic respiratory failure with hypoxia on 2 L supplemental O2 via [place], CAD, CKD stage IIIa, PAD, T2DM, HTN, HLD who is admitted with acute on chronic respiratory failure with  hypoxia in the setting of COVID-19 infection.  Acute on chronic respiratory failure with hypoxia in setting of COVID-19 infection: History of prior COVID-19 requiring hospitalization September 2021.  Has received previous vaccinations.  SARS-CoV-2 again positive 5/28 and symptomatic.  CXR with chronic emphysematous and scarring changes with possible new versus acute on chronic left lower lung infiltrate.  Currently requiring 4 L O2 via [place] compared to baseline of 2 L. -Continue IV remdesivir (Paxlovid discussed with pharmacy, not started as it may interfere with his maintenance COPD inhaler) -IV Solu-Medrol 40 mg twice daily -Continue supplemental O2 and wean to home  2 L via [place] as able -Scheduled Combivent with as needed albuterol inhaler  COPD: No active wheezing or evidence of acute exacerbation on admission. -Continue Advair, Singulair and management as above  Generalized weakness: -Mobilize with PT  Type 2 diabetes: -Hold home metformin and place on SSI  CKD stage IIIa: Stable, continue to monitor.  CAD HLD: Denies any chest pain.  Continue Lopressor, aspirin, and rosuvastatin.  Hypertension: Continue Lopressor.  DVT prophylaxis: Lovenox Code Status: Full code, confirmed with patient Family Communication: Discussed with patient, he has discussed with family Disposition Plan: From home, dispo pending clinical progress Consults called: None Level of care: Med-Surg Admission status:  Status is: Observation  The patient remains OBS appropriate and will d/c before 2 midnights.  Dispo: The patient is from: Home              Anticipated d/c is to: Home              Patient currently is not medically stable to d/c.   Difficult to place patient No  Zada Finders MD [facility]  If 7PM-7AM, please contact night-coverage [email or web address]  07/29/2020, 8:20 PM

## 2020-07-29 NOTE — ED Notes (Signed)
Daughter notified of pt not allowed to have visitors d/t +covid, states will come back to bring pt charger. Pt brought meal tray and po fluids. No other needs at this time.

## 2020-07-29 NOTE — ED Triage Notes (Addendum)
pt arrive [facility] from home for COPD, weakness, vomiting. Vomiting today. SOB worse than normal x 2 days.  2 duonebs given, 125mg  solumedrol, 4mg  zofran 18 L AC stage 4 COPD, wears 2 L chronically.   CBG 181, 91% 4L, 130/63.

## 2020-07-30 DIAGNOSIS — Z8249 Family history of ischemic heart disease and other diseases of the circulatory system: Secondary | ICD-10-CM | POA: Diagnosis not present

## 2020-07-30 DIAGNOSIS — I2510 Atherosclerotic heart disease of native coronary artery without angina pectoris: Secondary | ICD-10-CM | POA: Diagnosis present

## 2020-07-30 DIAGNOSIS — E1151 Type 2 diabetes mellitus with diabetic peripheral angiopathy without gangrene: Secondary | ICD-10-CM | POA: Diagnosis present

## 2020-07-30 DIAGNOSIS — J440 Chronic obstructive pulmonary disease with acute lower respiratory infection: Secondary | ICD-10-CM | POA: Diagnosis present

## 2020-07-30 DIAGNOSIS — E785 Hyperlipidemia, unspecified: Secondary | ICD-10-CM | POA: Diagnosis present

## 2020-07-30 DIAGNOSIS — J449 Chronic obstructive pulmonary disease, unspecified: Secondary | ICD-10-CM | POA: Diagnosis not present

## 2020-07-30 DIAGNOSIS — Z8701 Personal history of pneumonia (recurrent): Secondary | ICD-10-CM | POA: Diagnosis not present

## 2020-07-30 DIAGNOSIS — I152 Hypertension secondary to endocrine disorders: Secondary | ICD-10-CM | POA: Diagnosis present

## 2020-07-30 DIAGNOSIS — R0602 Shortness of breath: Secondary | ICD-10-CM | POA: Diagnosis present

## 2020-07-30 DIAGNOSIS — I10 Essential (primary) hypertension: Secondary | ICD-10-CM | POA: Diagnosis present

## 2020-07-30 DIAGNOSIS — E1169 Type 2 diabetes mellitus with other specified complication: Secondary | ICD-10-CM | POA: Diagnosis present

## 2020-07-30 DIAGNOSIS — Z7984 Long term (current) use of oral hypoglycemic drugs: Secondary | ICD-10-CM | POA: Diagnosis not present

## 2020-07-30 DIAGNOSIS — Z7982 Long term (current) use of aspirin: Secondary | ICD-10-CM | POA: Diagnosis not present

## 2020-07-30 DIAGNOSIS — E1122 Type 2 diabetes mellitus with diabetic chronic kidney disease: Secondary | ICD-10-CM | POA: Diagnosis present

## 2020-07-30 DIAGNOSIS — N1831 Chronic kidney disease, stage 3a: Secondary | ICD-10-CM | POA: Diagnosis present

## 2020-07-30 DIAGNOSIS — Z79899 Other long term (current) drug therapy: Secondary | ICD-10-CM | POA: Diagnosis not present

## 2020-07-30 DIAGNOSIS — U071 COVID-19: Secondary | ICD-10-CM | POA: Diagnosis present

## 2020-07-30 DIAGNOSIS — Z87891 Personal history of nicotine dependence: Secondary | ICD-10-CM | POA: Diagnosis not present

## 2020-07-30 DIAGNOSIS — J9620 Acute and chronic respiratory failure, unspecified whether with hypoxia or hypercapnia: Secondary | ICD-10-CM | POA: Diagnosis present

## 2020-07-30 DIAGNOSIS — J9621 Acute and chronic respiratory failure with hypoxia: Secondary | ICD-10-CM | POA: Diagnosis present

## 2020-07-30 DIAGNOSIS — G4733 Obstructive sleep apnea (adult) (pediatric): Secondary | ICD-10-CM | POA: Diagnosis present

## 2020-07-30 DIAGNOSIS — J1282 Pneumonia due to coronavirus disease 2019: Secondary | ICD-10-CM | POA: Diagnosis present

## 2020-07-30 DIAGNOSIS — Z9981 Dependence on supplemental oxygen: Secondary | ICD-10-CM | POA: Diagnosis not present

## 2020-07-30 LAB — CBC WITH DIFFERENTIAL/PLATELET
Abs Immature Granulocytes: 0.06 10*3/uL (ref 0.00–0.07)
Basophils Absolute: 0 10*3/uL (ref 0.0–0.1)
Basophils Relative: 0 %
Eosinophils Absolute: 0 10*3/uL (ref 0.0–0.5)
Eosinophils Relative: 0 %
HCT: 39.1 % (ref 39.0–52.0)
Hemoglobin: 12.8 g/dL — ABNORMAL LOW (ref 13.0–17.0)
Immature Granulocytes: 1 %
Lymphocytes Relative: 6 %
Lymphs Abs: 0.5 10*3/uL — ABNORMAL LOW (ref 0.7–4.0)
MCH: 27.7 pg (ref 26.0–34.0)
MCHC: 32.7 g/dL (ref 30.0–36.0)
MCV: 84.6 fL (ref 80.0–100.0)
Monocytes Absolute: 0.4 10*3/uL (ref 0.1–1.0)
Monocytes Relative: 5 %
Neutro Abs: 8.8 10*3/uL — ABNORMAL HIGH (ref 1.7–7.7)
Neutrophils Relative %: 88 %
Platelets: 266 10*3/uL (ref 150–400)
RBC: 4.62 MIL/uL (ref 4.22–5.81)
RDW: 15.8 % — ABNORMAL HIGH (ref 11.5–15.5)
WBC: 9.9 10*3/uL (ref 4.0–10.5)
nRBC: 0 % (ref 0.0–0.2)

## 2020-07-30 LAB — PHOSPHORUS: Phosphorus: 2.7 mg/dL (ref 2.5–4.6)

## 2020-07-30 LAB — COMPREHENSIVE METABOLIC PANEL
ALT: 16 U/L (ref 0–44)
AST: 27 U/L (ref 15–41)
Albumin: 3.5 g/dL (ref 3.5–5.0)
Alkaline Phosphatase: 74 U/L (ref 38–126)
Anion gap: 7 (ref 5–15)
BUN: 18 mg/dL (ref 8–23)
CO2: 22 mmol/L (ref 22–32)
Calcium: 9 mg/dL (ref 8.9–10.3)
Chloride: 108 mmol/L (ref 98–111)
Creatinine, Ser: 1.38 mg/dL — ABNORMAL HIGH (ref 0.61–1.24)
GFR, Estimated: 55 mL/min — ABNORMAL LOW (ref >60)
Glucose, Bld: 288 mg/dL — ABNORMAL HIGH (ref 70–99)
Potassium: 4 mmol/L (ref 3.5–5.1)
Sodium: 137 mmol/L (ref 135–145)
Total Bilirubin: 0.7 mg/dL (ref 0.3–1.2)
Total Protein: 6.9 g/dL (ref 6.5–8.1)

## 2020-07-30 LAB — GLUCOSE, CAPILLARY
Glucose-Capillary: 231 mg/dL — ABNORMAL HIGH (ref 70–99)
Glucose-Capillary: 185 mg/dL — ABNORMAL HIGH (ref 70–99)
Glucose-Capillary: 225 mg/dL — ABNORMAL HIGH (ref 70–99)
Glucose-Capillary: 225 mg/dL — ABNORMAL HIGH (ref 70–99)

## 2020-07-30 LAB — FERRITIN: Ferritin: 27 ng/mL (ref 24–336)

## 2020-07-30 LAB — HIV ANTIBODY (ROUTINE TESTING W REFLEX): HIV Screen 4th Generation wRfx: NONREACTIVE

## 2020-07-30 LAB — PROCALCITONIN: Procalcitonin: 0.19 ng/mL

## 2020-07-30 LAB — C-REACTIVE PROTEIN: CRP: 2.6 mg/dL — ABNORMAL HIGH (ref <1.0)

## 2020-07-30 LAB — D-DIMER, QUANTITATIVE: D-Dimer, Quant: 0.66 ug/mL-FEU — ABNORMAL HIGH (ref 0.00–0.50)

## 2020-07-30 LAB — MAGNESIUM: Magnesium: 2.3 mg/dL (ref 1.7–2.4)

## 2020-07-30 MED ORDER — METOPROLOL TARTRATE 25 MG PO TABS
25.0000 mg | ORAL_TABLET | Freq: Two times a day (BID) | ORAL | Status: DC
  Administered 2020-07-30 – 2020-08-02 (×5): 25 mg via ORAL
  Filled 2020-07-30 (×6): qty 1

## 2020-07-30 NOTE — Progress Notes (Addendum)
PROGRESS NOTE    Bradley [name]  RXV:[id number] DOB: Jan 09, 1951 DOA: 07/29/2020 PCP: Valera Castle, MD    Brief Narrative:  Bradley [name] is a 70 y.o. male with medical history significant for COPD, chronic respiratory failure with hypoxia on 2 L supplemental O2 via [place], CAD, CKD stage IIIa, PAD, T2DM, HTN, HLD who presents to the ED for evaluation of generalized weakness, shortness of breath.  He has been on 2 L supplemental O2 at all times but is now requiring 4 L via [place].  He has not had any chest pain, abdominal pain, dysuria, or diarrhea.  He has received previous COVID-19 vaccinations (1 J&J vaccine in April 2021, and 2 Pfizer vaccinations most recently April 2022).  He is a former smoker, quit in 2017.  SARS-CoV-2 PCR is positive.  Influenza A/B PCR's are negative.  CXR: Emphysematous changes with chronic scarring. Some new superimposed left basilar opacity is seen. This may represent some acute on chronic infiltrate.  5/29-feels little better, not at baseline. No cp, no diarrhea  Consultants:     Procedures:   Antimicrobials:   Remdesivir    Subjective: No cp, diarrhea, vomiting  Objective: Vitals:   07/29/20 2230 07/29/20 2339 07/30/20 0519 07/30/20 0805  BP: 111/68 124/65 110/76 119/80  Pulse: (!) 101 95 82 79  Resp: (!) 22 16 16 18   Temp:  98.8 F (37.1 C) 98 F (36.7 C) 98 F (36.7 C)  TempSrc:      SpO2: 94% 95% 94% 94%  Weight:      Height:        Intake/Output Summary (Last 24 hours) at 07/30/2020 0836 Last data filed at 07/30/2020 0815 Gross per 24 hour  Intake --  Output 900 ml  Net -900 ml   Filed Weights   07/29/20 1712  Weight: 70.3 kg    Examination:  General exam: Appears calm and comfortable  Respiratory system: decrease bs , increase exp. Time, no w/r Cardiovascular system: S1 & S2 heard, RRR. No murmur Gastrointestinal system: Abdomen is nondistended, soft and nontender. Normal bowel sounds heard. Central nervous system:  Alert and oriented. No focal neurological deficits. Extremities: no edema Skin: warm, dry Psychiatry:  Mood & affect appropriate current setting.     Data Reviewed: I have personally reviewed following labs and imaging studies  CBC: Recent Labs  Lab 07/29/20 1715 07/30/20 0529  WBC 13.9* 9.9  NEUTROABS 11.5* 8.8*  HGB 13.0 12.8*  HCT 41.3 39.1  MCV 85.9 84.6  PLT 254 509   Basic Metabolic Panel: Recent Labs  Lab 07/29/20 1715 07/30/20 0529  NA 138 137  K 3.6 4.0  CL 105 108  CO2 21* 22  GLUCOSE 177* 288*  BUN 19 18  CREATININE 1.30* 1.38*  CALCIUM 9.0 9.0  MG  --  2.3  PHOS  --  2.7   GFR: Estimated Creatinine Clearance: 46.6 mL/min (A) (by C-G formula based on SCr of 1.38 mg/dL (H)). Liver Function Tests: Recent Labs  Lab 07/29/20 1715 07/30/20 0529  AST 17 27  ALT 17 16  ALKPHOS 81 74  BILITOT 1.1 0.7  PROT 7.4 6.9  ALBUMIN 3.8 3.5   No results for input(s): LIPASE, AMYLASE in the last 168 hours. No results for input(s): AMMONIA in the last 168 hours. Coagulation Profile: No results for input(s): INR, PROTIME in the last 168 hours. Cardiac Enzymes: No results for input(s): CKTOTAL, CKMB, CKMBINDEX, TROPONINI in the last 168 hours. BNP (last 3 results)  No results for input(s): PROBNP in the last 8760 hours. HbA1C: No results for input(s): HGBA1C in the last 72 hours. CBG: Recent Labs  Lab 07/30/20 0806  GLUCAP 231*   Lipid Profile: No results for input(s): CHOL, HDL, LDLCALC, TRIG, CHOLHDL, LDLDIRECT in the last 72 hours. Thyroid Function Tests: No results for input(s): TSH, T4TOTAL, FREET4, T3FREE, THYROIDAB in the last 72 hours. Anemia Panel: Recent Labs    07/30/20 0529  FERRITIN 27   Sepsis Labs: Recent Labs  Lab 07/30/20 0529  PROCALCITON 0.19    Recent Results (from the past 240 hour(s))  Resp Panel by RT-PCR (Flu A&B, Covid) Nasopharyngeal Swab     Status: Abnormal   Collection Time: 07/29/20  5:44 PM   Specimen:  Nasopharyngeal Swab; Nasopharyngeal(NP) swabs in vial transport medium  Result Value Ref Range Status   SARS Coronavirus 2 by RT PCR POSITIVE (A) NEGATIVE Final    Comment: RESULT CALLED TO, READ BACK BY AND VERIFIED WITH: Darrel Hoover @ [phone] on 07/29/2020 by CAF (NOTE) SARS-CoV-2 target nucleic acids are DETECTED.  The SARS-CoV-2 RNA is generally detectable in upper respiratory specimens during the acute phase of infection. Positive results are indicative of the presence of the identified virus, but do not rule out bacterial infection or co-infection with other pathogens not detected by the test. Clinical correlation with patient history and other diagnostic information is necessary to determine patient infection status. The expected result is Negative.  Fact Sheet for Patients: EntrepreneurPulse.com.au  Fact Sheet for Healthcare Providers: IncredibleEmployment.be  This test is not yet approved or cleared by the [place] FDA and  has been authorized for detection and/or diagnosis of SARS-CoV-2 by FDA under an Emergency Use Authorization (EUA).  This EUA will remain in effect (meaning this test c an be used) for the duration of  the COVID-19 declaration under Section 564(b)(1) of the Act, 21 U.S.C. section 360bbb-3(b)(1), unless the authorization is terminated or revoked sooner.     Influenza A by PCR NEGATIVE NEGATIVE Final   Influenza B by PCR NEGATIVE NEGATIVE Final    Comment: (NOTE) The Xpert Xpress SARS-CoV-2/FLU/RSV plus assay is intended as an aid in the diagnosis of influenza from Nasopharyngeal swab specimens and should not be used as a sole basis for treatment. Nasal washings and aspirates are unacceptable for Xpert Xpress SARS-CoV-2/FLU/RSV testing.  Fact Sheet for Patients: EntrepreneurPulse.com.au  Fact Sheet for Healthcare Providers: IncredibleEmployment.be  This test is not yet  approved or cleared by the [place] FDA and has been authorized for detection and/or diagnosis of SARS-CoV-2 by FDA under an Emergency Use Authorization (EUA). This EUA will remain in effect (meaning this test can be used) for the duration of the COVID-19 declaration under Section 564(b)(1) of the Act, 21 U.S.C. section 360bbb-3(b)(1), unless the authorization is terminated or revoked.  Performed at [facility], [address] [place], [place] [address]          Radiology Studies: DG Chest Portable 1 View  Result Date: 07/29/2020 CLINICAL DATA:  Shortness of breath and weakness EXAM: PORTABLE CHEST 1 VIEW COMPARISON:  06/02/20 FINDINGS: Cardiac shadow is within normal limits. Emphysematous changes are seen with diffuse scarring bilaterally. Some mild superimposed opacities are noted in the left lung base no pneumothorax is seen. No other focal abnormality is noted. IMPRESSION: Emphysematous changes with chronic scarring. Some new superimposed left basilar opacity is seen. This may represent some acute on chronic infiltrate. Electronically Signed   By: Inez Catalina  M.D.   On: 07/29/2020 17:40        Scheduled Meds: . vitamin C  500 mg Oral Daily  . aspirin EC  81 mg Oral QPM  . enoxaparin (LOVENOX) injection  40 mg Subcutaneous Q24H  . insulin aspart  0-5 Units Subcutaneous QHS  . insulin aspart  0-9 Units Subcutaneous TID WC  . Ipratropium-Albuterol  1 puff Inhalation Q6H  . methylPREDNISolone (SOLU-MEDROL) injection  40 mg Intravenous Q12H  . metoprolol tartrate  50 mg Oral BID  . mometasone-formoterol  2 puff Inhalation BID  . montelukast  10 mg Oral QHS  . rosuvastatin  40 mg Oral Daily  . zinc sulfate  220 mg Oral Daily   Continuous Infusions: . remdesivir 100 mg in NS 100 mL      Assessment & Plan:   Principal Problem:   Acute on chronic respiratory failure with hypoxia (HCC) Active Problems:   Type 2 diabetes mellitus with vascular disease  (HCC)   Coronary artery disease involving native coronary artery of native heart without angina pectoris   CKD (chronic kidney disease) stage 3, GFR 30-59 ml/min (HCC)   Hypertension associated with diabetes ([place])   Hyperlipidemia associated with type 2 diabetes mellitus ([place])   COVID-19 virus infection   Bradley [name] is a 70 y.o. male with medical history significant for COPD, chronic respiratory failure with hypoxia on 2 L supplemental O2 via [place], CAD, CKD stage IIIa, PAD, T2DM, HTN, HLD who is admitted with acute on chronic respiratory failure with hypoxia in the setting of COVID-19 infection.  Acute on chronic respiratory failure with hypoxia in setting of COVID-19 infection: History of prior COVID-19 requiring hospitalization September 2021.  Has received previous vaccinations.  SARS-CoV-2 again positive 5/28 and symptomatic.  CXR with chronic emphysematous and scarring changes with possible new versus acute on chronic left lower lung infiltrate.  Currently requiring 4 L O2 via [place] compared to baseline of 2 L. 5/29-clinically mildly has improved but not at baseline Continue IV remdesivir Continue IV steroids Keep supplemental oxygen keeping O2 sats more than 92% Continue Combivent and inhalers I-S and flutter valves   COPD: Without active exacerbation  Continue Advair, Singulair and inhalers    Generalized weakness: PT consulted, patient does not need follow-up PT  Type 2 diabetes: -BG stable  Continue to monitor specially being on steroids  R ISS  Continue to hold metformin     CKD stage IIIa: Stable, at baseline  CAD HLD: Denies any chest pain.  Continue Lopressor, aspirin, and rosuvastatin.  Hypertension: BP normotensive, will decrease metoprolol to 25 mg twice daily from 50 mg twice daily    DVT prophylaxis: lovenox Code Status:full Family Communication: none at bedside  Status is: Observation  The patient remains OBS appropriate and will d/c before 2  midnights.  Dispo: The patient is from: Home              Anticipated d/c is to: Home              Patient currently is not medically stable to d/c.   Difficult to place patient No            LOS: 0 days   Time spent: 35 min with >50% on coc    Nolberto Hanlon, MD [facility] Pager [phone]  If 7PM-7AM, please contact night-coverage 07/30/2020, 8:36 AM

## 2020-07-30 NOTE — Plan of Care (Signed)
Patient alert and oriented x4. IV in [place] intact. Patient remained on 4 L O2. Patient got up to chair for the majority of the day. No issues or concerns noted. Bed locked, low and call bell within reach.

## 2020-07-30 NOTE — Plan of Care (Signed)
End of Shift Summary:  Alert and oriented x4. VSS. Remained on 4L via [place]. Robitussin x1 for cough. Denies pain or dyspnea at rest. Denies n/v. Urine output adequate. Remained free from falls or injury. Bed low and in locked position. Call bell within reach and able to use.   Problem: Health Behavior/Discharge Planning: Goal: Ability to manage health-related needs will improve Outcome: Progressing   Problem: Clinical Measurements: Goal: Ability to maintain clinical measurements within normal limits will improve Outcome: Progressing Goal: Will remain free from infection Outcome: Progressing Goal: Diagnostic test results will improve Outcome: Progressing Goal: Respiratory complications will improve Outcome: Progressing Goal: Cardiovascular complication will be avoided Outcome: Progressing   Problem: Activity: Goal: Risk for activity intolerance will decrease Outcome: Progressing   Problem: Nutrition: Goal: Adequate nutrition will be maintained Outcome: Progressing   Problem: Coping: Goal: Level of anxiety will decrease Outcome: Progressing   Problem: Pain Managment: Goal: General experience of comfort will improve Outcome: Progressing   Problem: Safety: Goal: Ability to remain free from injury will improve Outcome: Progressing   Problem: Skin Integrity: Goal: Risk for impaired skin integrity will decrease Outcome: Progressing

## 2020-07-30 NOTE — Evaluation (Signed)
Physical Therapy Evaluation Patient Details Name: Bradley [name] MRN: [id number] DOB: [birth year] Today's Date: 07/30/2020   History of Present Illness  Pt is a 70 y.o. male presenting to hospital 5/28 with SOB, weakness, nausea and vomiting.  PMH includes COPD (2 L home O2), CAD, DM, htn, AKI, PAD, OSA, diastasis recti.  Clinical Impression  Prior to hospital admission, pt was independent with ambulation; uses 2 L home O2 baseline.  Currently pt is independent with transfers and SBA ambulating 45 feet (pt occasionally holding onto furniture in room but pt appearing steady)--limited distance d/t SOB.  Pt's O2 sats initially 88% on 4 L O2 post ambulation (from 92% at rest) but pt's O2 sats continued to decrease even with cueing for pursed lip breathing (O2 decreased down to 81% on 4 L O2 sitting/recovering in recliner); within a few minutes of sitting rest and cueing for breathing technique, pt's O2 sats gradually increased back to 92-93% at rest on 4 L O2 via nasal cannula--nurse notified of pt's O2 sats during sessions activities.  Pt would benefit from skilled PT to address noted impairments and functional limitations (see below for any additional details).  Upon hospital discharge, no further PT needs anticipated.    Follow Up Recommendations No PT follow up    Equipment Recommendations  None recommended by PT    Recommendations for Other Services       Precautions / Restrictions Precautions Precautions: None Restrictions Weight Bearing Restrictions: No      Mobility  Bed Mobility               General bed mobility comments: Deferred (pt sitting on edge of bed upon PT arrival and in recliner end of session)    Transfers Overall transfer level: Independent Equipment used: None             General transfer comment: steady safe transfer noted from bed  Ambulation/Gait Ambulation/Gait assistance: Supervision Gait Distance (Feet): 45 Feet Assistive device: None  (occasionally holding onto furniture in room but pt appearing steady)   Gait velocity: decreased   General Gait Details: partial step through gait pattern; steady; limited distance d/t SOB  Stairs            Wheelchair Mobility    Modified Rankin (Stroke Patients Only)       Balance Overall balance assessment: Needs assistance Sitting-balance support: No upper extremity supported;Feet supported Sitting balance-Leahy Scale: Normal Sitting balance - Comments: steady sitting reaching outside BOS   Standing balance support: No upper extremity supported;During functional activity Standing balance-Leahy Scale: Good Standing balance comment: no loss of balance with ambulation noted                             Pertinent Vitals/Pain Pain Assessment: No/denies pain  HR 78-98 bpm during sessions activities.    Home Living Family/patient expects to be discharged to:: Private residence Living Arrangements: Alone Available Help at Discharge: Family Type of Home: House Home Access: Stairs to enter Entrance Stairs-Rails: None Technical brewer of Steps: 3 Home Layout: One level Home Equipment: Other (comment) Additional Comments: 2 L chronic home O2    Prior Function Level of Independence: Independent         Comments: 2 L home O2 but sometimes uses 3.5 to 4 L to recover for SOB post activity (O2 sats can also drop to upper 70's with mobility on O2); no falls in past 6 months  Hand Dominance        Extremity/Trunk Assessment   Upper Extremity Assessment Upper Extremity Assessment: Generalized weakness    Lower Extremity Assessment Lower Extremity Assessment: Generalized weakness    Cervical / Trunk Assessment Cervical / Trunk Assessment: Normal  Communication   Communication: No difficulties  Cognition Arousal/Alertness: Awake/alert Behavior During Therapy: WFL for tasks assessed/performed Overall Cognitive Status: Within Functional  Limits for tasks assessed                                        General Comments   Nursing cleared pt for participation in physical therapy.  Pt agreeable to PT session.    Exercises     Assessment/Plan    PT Assessment Patient needs continued PT services  PT Problem List Decreased strength;Decreased activity tolerance;Decreased mobility;Decreased balance;Decreased knowledge of use of DME;Cardiopulmonary status limiting activity       PT Treatment Interventions DME instruction;Gait training;Stair training;Functional mobility training;Therapeutic activities;Therapeutic exercise;Balance training;Patient/family education    PT Goals (Current goals can be found in the Care Plan section)  Acute Rehab PT Goals Patient Stated Goal: to feel better and go home PT Goal Formulation: With patient Time For Goal Achievement: 08/13/20 Potential to Achieve Goals: Good    Frequency Min 2X/week   Barriers to discharge        Co-evaluation               AM-PAC PT "6 Clicks" Mobility  Outcome Measure Help needed turning from your back to your side while in a flat bed without using bedrails?: None Help needed moving from lying on your back to sitting on the side of a flat bed without using bedrails?: None Help needed moving to and from a bed to a chair (including a wheelchair)?: None Help needed standing up from a chair using your arms (e.g., wheelchair or bedside chair)?: None Help needed to walk in hospital room?: A Little Help needed climbing 3-5 steps with a railing? : A Little 6 Click Score: 22    End of Session Equipment Utilized During Treatment: Gait belt Activity Tolerance: Other (comment) (limited d/t O2 desaturation with activity) Patient left: in chair;with call bell/phone within reach;Other (comment) (nursing cleared pt to not have chair alarm on) Nurse Communication: Mobility status;Precautions;Other (comment) (pt's O2 sats during session) PT Visit  Diagnosis: Other abnormalities of gait and mobility (R26.89);Muscle weakness (generalized) (M62.81)    Time: 4193-7902 PT Time Calculation (min) (ACUTE ONLY): 20 min   Charges:   PT Evaluation $PT Eval Low Complexity: 1 Low        Jissel Slavens, PT 07/30/20, 1:52 PM

## 2020-07-31 DIAGNOSIS — J9621 Acute and chronic respiratory failure with hypoxia: Secondary | ICD-10-CM | POA: Diagnosis not present

## 2020-07-31 LAB — CBC WITH DIFFERENTIAL/PLATELET
Abs Immature Granulocytes: 0.09 10*3/uL — ABNORMAL HIGH (ref 0.00–0.07)
Basophils Absolute: 0 10*3/uL (ref 0.0–0.1)
Basophils Relative: 0 %
Eosinophils Absolute: 0 10*3/uL (ref 0.0–0.5)
Eosinophils Relative: 0 %
HCT: 37.1 % — ABNORMAL LOW (ref 39.0–52.0)
Hemoglobin: 11.8 g/dL — ABNORMAL LOW (ref 13.0–17.0)
Immature Granulocytes: 1 %
Lymphocytes Relative: 7 %
Lymphs Abs: 0.9 10*3/uL (ref 0.7–4.0)
MCH: 27.1 pg (ref 26.0–34.0)
MCHC: 31.8 g/dL (ref 30.0–36.0)
MCV: 85.1 fL (ref 80.0–100.0)
Monocytes Absolute: 1.1 10*3/uL — ABNORMAL HIGH (ref 0.1–1.0)
Monocytes Relative: 8 %
Neutro Abs: 11.4 10*3/uL — ABNORMAL HIGH (ref 1.7–7.7)
Neutrophils Relative %: 84 %
Platelets: 271 10*3/uL (ref 150–400)
RBC: 4.36 MIL/uL (ref 4.22–5.81)
RDW: 15.8 % — ABNORMAL HIGH (ref 11.5–15.5)
WBC: 13.6 10*3/uL — ABNORMAL HIGH (ref 4.0–10.5)
nRBC: 0 % (ref 0.0–0.2)

## 2020-07-31 LAB — COMPREHENSIVE METABOLIC PANEL
ALT: 15 U/L (ref 0–44)
AST: 13 U/L — ABNORMAL LOW (ref 15–41)
Albumin: 3.3 g/dL — ABNORMAL LOW (ref 3.5–5.0)
Alkaline Phosphatase: 63 U/L (ref 38–126)
Anion gap: 7 (ref 5–15)
BUN: 27 mg/dL — ABNORMAL HIGH (ref 8–23)
CO2: 23 mmol/L (ref 22–32)
Calcium: 8.7 mg/dL — ABNORMAL LOW (ref 8.9–10.3)
Chloride: 109 mmol/L (ref 98–111)
Creatinine, Ser: 1.19 mg/dL (ref 0.61–1.24)
GFR, Estimated: >60 mL/min (ref >60)
Glucose, Bld: 213 mg/dL — ABNORMAL HIGH (ref 70–99)
Potassium: 4.7 mmol/L (ref 3.5–5.1)
Sodium: 139 mmol/L (ref 135–145)
Total Bilirubin: 0.6 mg/dL (ref 0.3–1.2)
Total Protein: 6.6 g/dL (ref 6.5–8.1)

## 2020-07-31 LAB — GLUCOSE, CAPILLARY
Glucose-Capillary: 178 mg/dL — ABNORMAL HIGH (ref 70–99)
Glucose-Capillary: 165 mg/dL — ABNORMAL HIGH (ref 70–99)
Glucose-Capillary: 168 mg/dL — ABNORMAL HIGH (ref 70–99)
Glucose-Capillary: 286 mg/dL — ABNORMAL HIGH (ref 70–99)

## 2020-07-31 LAB — PHOSPHORUS: Phosphorus: 2.7 mg/dL (ref 2.5–4.6)

## 2020-07-31 LAB — FERRITIN: Ferritin: 27 ng/mL (ref 24–336)

## 2020-07-31 LAB — D-DIMER, QUANTITATIVE: D-Dimer, Quant: 0.48 ug/mL-FEU (ref 0.00–0.50)

## 2020-07-31 LAB — C-REACTIVE PROTEIN: CRP: 2 mg/dL — ABNORMAL HIGH (ref <1.0)

## 2020-07-31 LAB — MAGNESIUM: Magnesium: 2.5 mg/dL — ABNORMAL HIGH (ref 1.7–2.4)

## 2020-07-31 MED ORDER — INSULIN GLARGINE 100 UNIT/ML ~~LOC~~ SOLN
3.0000 [IU] | Freq: Every day | SUBCUTANEOUS | Status: DC
  Administered 2020-07-31 – 2020-08-02 (×3): 3 [IU] via SUBCUTANEOUS
  Filled 2020-07-31 (×4): qty 0.03

## 2020-07-31 NOTE — Plan of Care (Signed)
Patient alert and oriented x4. IV in [place] intact. Patient remained on 4 L O2. All scheduled medications administered and well tolerated. Patient got up to chair. No issues or concerns noted. Bed locked, low and call bell within reach Problem: Health Behavior/Discharge Planning: Goal: Ability to manage health-related needs will improve Outcome: Progressing   Problem: Clinical Measurements: Goal: Ability to maintain clinical measurements within normal limits will improve Outcome: Progressing  Problem: Clinical Measurements: Goal: Diagnostic test results will improve Outcome: Progressing   Problem: Clinical Measurements: Goal: Respiratory complications will improve Outcome: Progressing   Problem: Activity: Goal: Risk for activity intolerance will decrease Outcome: Progressing

## 2020-07-31 NOTE — Progress Notes (Signed)
PROGRESS NOTE    [name]  DVV:[id number] DOB: [birth year] DOA: 07/29/2020 PCP: Valera Castle, MD    Brief Narrative:  Bradley [name] is a 70 y.o. male with medical history significant for COPD, chronic respiratory failure with hypoxia on 2 L supplemental O2 via [place], CAD, CKD stage IIIa, PAD, T2DM, HTN, HLD who presents to the ED for evaluation of generalized weakness, shortness of breath.  He has been on 2 L supplemental O2 at all times but is now requiring 4 L via [place].  He has not had any chest pain, abdominal pain, dysuria, or diarrhea.  He has received previous COVID-19 vaccinations (1 J&J vaccine in April 2021, and 2 Pfizer vaccinations most recently April 2022).  He is a former smoker, quit in 2017.  SARS-CoV-2 PCR is positive.  Influenza A/B PCR's are negative.  CXR: Emphysematous changes with chronic scarring. Some new superimposed left basilar opacity is seen. This may represent some acute on chronic infiltrate.  5/30 still sob, not at baseline. Still requiring 4L 02. No cp or diarrhea  Consultants:     Procedures:   Antimicrobials:   Remdesivir    Subjective: No vomiting. No cp  Objective: Vitals:   07/30/20 2020 07/31/20 0039 07/31/20 0524 07/31/20 0818  BP: 120/67 118/72 (!) 91/55 115/76  Pulse: 83 65 (!) 57 68  Resp: 18 16 20 15   Temp: 98 F (36.7 C) 98 F (36.7 C) 97.9 F (36.6 C) 97.8 F (36.6 C)  TempSrc: Oral     SpO2: 92% 100% 98% 97%  Weight:      Height:        Intake/Output Summary (Last 24 hours) at 07/31/2020 0824 Last data filed at 07/31/2020 0519 Gross per 24 hour  Intake --  Output 1675 ml  Net -1675 ml   Filed Weights   07/29/20 1712  Weight: 70.3 kg    Examination: Nad, calm Mild rhonchi,no wheezing Regular S1-S2 no gallops Soft benign positive bowel sounds No edema Awake and alert, grossly intact Mood and affect appropriate in current setting   Data Reviewed: I have personally reviewed following labs and  imaging studies  CBC: Recent Labs  Lab 07/29/20 1715 07/30/20 0529 07/31/20 0416  WBC 13.9* 9.9 13.6*  NEUTROABS 11.5* 8.8* 11.4*  HGB 13.0 12.8* 11.8*  HCT 41.3 39.1 37.1*  MCV 85.9 84.6 85.1  PLT 254 266 626   Basic Metabolic Panel: Recent Labs  Lab 07/29/20 1715 07/30/20 0529 07/31/20 0416  NA 138 137 139  K 3.6 4.0 4.7  CL 105 108 109  CO2 21* 22 23  GLUCOSE 177* 288* 213*  BUN 19 18 27*  CREATININE 1.30* 1.38* 1.19  CALCIUM 9.0 9.0 8.7*  MG  --  2.3 2.5*  PHOS  --  2.7 2.7   GFR: Estimated Creatinine Clearance: 54 mL/min (by C-G formula based on SCr of 1.19 mg/dL). Liver Function Tests: Recent Labs  Lab 07/29/20 1715 07/30/20 0529 07/31/20 0416  AST 17 27 13*  ALT 17 16 15   ALKPHOS 81 74 63  BILITOT 1.1 0.7 0.6  PROT 7.4 6.9 6.6  ALBUMIN 3.8 3.5 3.3*   No results for input(s): LIPASE, AMYLASE in the last 168 hours. No results for input(s): AMMONIA in the last 168 hours. Coagulation Profile: No results for input(s): INR, PROTIME in the last 168 hours. Cardiac Enzymes: No results for input(s): CKTOTAL, CKMB, CKMBINDEX, TROPONINI in the last 168 hours. BNP (last 3 results) No results for input(s):  PROBNP in the last 8760 hours. HbA1C: No results for input(s): HGBA1C in the last 72 hours. CBG: Recent Labs  Lab 07/30/20 0806 07/30/20 1155 07/30/20 1642 07/30/20 2025 07/31/20 0815  GLUCAP 231* 185* 225* 225* 178*   Lipid Profile: No results for input(s): CHOL, HDL, LDLCALC, TRIG, CHOLHDL, LDLDIRECT in the last 72 hours. Thyroid Function Tests: No results for input(s): TSH, T4TOTAL, FREET4, T3FREE, THYROIDAB in the last 72 hours. Anemia Panel: Recent Labs    07/30/20 0529 07/31/20 0416  FERRITIN 27 27   Sepsis Labs: Recent Labs  Lab 07/30/20 0529  PROCALCITON 0.19    Recent Results (from the past 240 hour(s))  Resp Panel by RT-PCR (Flu A&B, Covid) Nasopharyngeal Swab     Status: Abnormal   Collection Time: 07/29/20  5:44 PM    Specimen: Nasopharyngeal Swab; Nasopharyngeal(NP) swabs in vial transport medium  Result Value Ref Range Status   SARS Coronavirus 2 by RT PCR POSITIVE (A) NEGATIVE Final    Comment: RESULT CALLED TO, READ BACK BY AND VERIFIED WITH: Darrel Hoover @ 1917 on 07/29/2020 by CAF (NOTE) SARS-CoV-2 target nucleic acids are DETECTED.  The SARS-CoV-2 RNA is generally detectable in upper respiratory specimens during the acute phase of infection. Positive results are indicative of the presence of the identified virus, but do not rule out bacterial infection or co-infection with other pathogens not detected by the test. Clinical correlation with patient history and other diagnostic information is necessary to determine patient infection status. The expected result is Negative.  Fact Sheet for Patients: EntrepreneurPulse.com.au  Fact Sheet for Healthcare Providers: IncredibleEmployment.be  This test is not yet approved or cleared by the [place] FDA and  has been authorized for detection and/or diagnosis of SARS-CoV-2 by FDA under an Emergency Use Authorization (EUA).  This EUA will remain in effect (meaning this test c an be used) for the duration of  the COVID-19 declaration under Section 564(b)(1) of the Act, 21 U.S.C. section 360bbb-3(b)(1), unless the authorization is terminated or revoked sooner.     Influenza A by PCR NEGATIVE NEGATIVE Final   Influenza B by PCR NEGATIVE NEGATIVE Final    Comment: (NOTE) The Xpert Xpress SARS-CoV-2/FLU/RSV plus assay is intended as an aid in the diagnosis of influenza from Nasopharyngeal swab specimens and should not be used as a sole basis for treatment. Nasal washings and aspirates are unacceptable for Xpert Xpress SARS-CoV-2/FLU/RSV testing.  Fact Sheet for Patients: EntrepreneurPulse.com.au  Fact Sheet for Healthcare Providers: IncredibleEmployment.be  This test is not  yet approved or cleared by the [place] FDA and has been authorized for detection and/or diagnosis of SARS-CoV-2 by FDA under an Emergency Use Authorization (EUA). This EUA will remain in effect (meaning this test can be used) for the duration of the COVID-19 declaration under Section 564(b)(1) of the Act, 21 U.S.C. section 360bbb-3(b)(1), unless the authorization is terminated or revoked.  Performed at [facility], [address], [place], [place] [address]          Radiology Studies: DG Chest Portable 1 View  Result Date: 07/29/2020 CLINICAL DATA:  Shortness of breath and weakness EXAM: PORTABLE CHEST 1 VIEW COMPARISON:  06/02/20 FINDINGS: Cardiac shadow is within normal limits. Emphysematous changes are seen with diffuse scarring bilaterally. Some mild superimposed opacities are noted in the left lung base no pneumothorax is seen. No other focal abnormality is noted. IMPRESSION: Emphysematous changes with chronic scarring. Some new superimposed left basilar opacity is seen. This may represent some acute  on chronic infiltrate. Electronically Signed   By: Inez Catalina M.D.   On: 07/29/2020 17:40        Scheduled Meds: . vitamin C  500 mg Oral Daily  . aspirin EC  81 mg Oral QPM  . enoxaparin (LOVENOX) injection  40 mg Subcutaneous Q24H  . insulin aspart  0-5 Units Subcutaneous QHS  . insulin aspart  0-9 Units Subcutaneous TID WC  . Ipratropium-Albuterol  1 puff Inhalation Q6H  . methylPREDNISolone (SOLU-MEDROL) injection  40 mg Intravenous Q12H  . metoprolol tartrate  25 mg Oral BID  . mometasone-formoterol  2 puff Inhalation BID  . montelukast  10 mg Oral QHS  . rosuvastatin  40 mg Oral Daily  . zinc sulfate  220 mg Oral Daily   Continuous Infusions: . remdesivir 100 mg in NS 100 mL 100 mg (07/30/20 1210)    Assessment & Plan:   Principal Problem:   Acute on chronic respiratory failure with hypoxia (HCC) Active Problems:   Type 2 diabetes mellitus  with vascular disease ([place])   Coronary artery disease involving native coronary artery of native heart without angina pectoris   CKD (chronic kidney disease) stage 3, GFR 30-59 ml/min (HCC)   Hypertension associated with diabetes ([place])   Hyperlipidemia associated with type 2 diabetes mellitus ([place])   COVID-19 virus infection   Acute on chronic respiratory failure ([place])   Bradley [name] is a 70 y.o. male with medical history significant for COPD, chronic respiratory failure with hypoxia on 2 L supplemental O2 via [place], CAD, CKD stage IIIa, PAD, T2DM, HTN, HLD who is admitted with acute on chronic respiratory failure with hypoxia in the setting of COVID-19 infection.  Acute on chronic respiratory failure with hypoxia in setting of COVID-19 infection: History of prior COVID-19 requiring hospitalization September 2021.  Has received previous vaccinations.  SARS-CoV-2 again positive 5/28 and symptomatic.  CXR with chronic emphysematous and scarring changes with possible new versus acute on chronic left lower lung infiltrate.  Currently requiring 4 L O2 via  compared to baseline of 2 L. 5/30-not much improvement of sob. CRP and D-dimer decreasing Still requiring 4L 02 Continue IV remdesivir and steroids Continue Combivent and inhalers I-S and flutter valves Continue vitamin C and zinc Hold statin since remdesivir can increase LFTs    COPD: A active exacerbation  Continue Advair, Singulair and inhalers     Generalized weakness: Likely from COVID infection PT consulted patient does not need follow-up with PT    Type 2 diabetes: BG mildly elevated Will start Lantus 3 units daily Continue R-ISS Continue to hold metformin     CKD stage IIIa: Stable  CAD HLD: Denies any chest pain.  Continue Lopressor, aspirin Holding statin since on remdesivir   Hypertension: BP normotensive Continue lower dose of metoprolol    DVT prophylaxis: lovenox Code Status:full Family  Communication: none at bedside  Status is: Inpatient  Inpatient level of care appropriate due to severity of illness  Dispo: The patient is from: Home              Anticipated d/c is to: Home              Patient currently is not medically stable to d/c.   Difficult to place patient No   Still requiring more 02 , still with sob, not at baseline. Still requiring iv meds         LOS: 1 day   Time spent: 35 min with >50%  on coc    Nolberto Hanlon, MD [facility] Pager [phone]  If 7PM-7AM, please contact night-coverage 07/31/2020, 8:24 AM

## 2020-07-31 NOTE — TOC Progression Note (Addendum)
Transition of Care [facility]) - Progression Note    Patient Details  Name: Bradley [name] MRN: [id number] Date of Birth: 12/13/1950  Transition of Care [facility]) CM/SW [place], RN Phone Number: 07/31/2020, 10:08 AM  Clinical Narrative:   Addendum to note:  Patient has home O2 set up prior to this hospitalization, has no concerns, but cannot recall the vendor.  TOC spoke with patient over the phone due to COVID + status.  Patient lives at home alone, but gets assistance from his niece.  Patient has no concerns about getting to appointments or taking medications.  Patient states his inhalers are expensive, referred to good RX.  TOC contact information given, TOC to follow through discharge.    Expected Discharge Plan: Home/Self Care Barriers to Discharge: Continued Medical Work up (COVID +)  Expected Discharge Plan and Services Expected Discharge Plan: Home/Self Care   Discharge Planning Services: CM Consult Post Acute Care Choice: Durable Medical Equipment (Currently has home O2)                   DME Arranged:  (Oxygen arranged prior to admission)                     Social Determinants of Health (SDOH) Interventions    Readmission Risk Interventions Readmission Risk Prevention Plan 07/31/2020  Transportation Screening Complete  PCP or Specialist Appt within 3-5 Days Complete  HRI or Home Care Consult Complete  Social Work Consult for [place] Planning/Counseling Not Complete  SW consult not completed comments RNCM assigned to patient  Palliative Care Screening Not Applicable  Medication Review Press photographer) Complete  Some recent data might be hidden

## 2020-08-01 DIAGNOSIS — J9621 Acute and chronic respiratory failure with hypoxia: Secondary | ICD-10-CM | POA: Diagnosis not present

## 2020-08-01 LAB — CBC WITH DIFFERENTIAL/PLATELET
Abs Immature Granulocytes: 0.08 10*3/uL — ABNORMAL HIGH (ref 0.00–0.07)
Basophils Absolute: 0 10*3/uL (ref 0.0–0.1)
Basophils Relative: 0 %
Eosinophils Absolute: 0 10*3/uL (ref 0.0–0.5)
Eosinophils Relative: 0 %
HCT: 40.2 % (ref 39.0–52.0)
Hemoglobin: 13 g/dL (ref 13.0–17.0)
Immature Granulocytes: 1 %
Lymphocytes Relative: 11 %
Lymphs Abs: 1 10*3/uL (ref 0.7–4.0)
MCH: 27.3 pg (ref 26.0–34.0)
MCHC: 32.3 g/dL (ref 30.0–36.0)
MCV: 84.3 fL (ref 80.0–100.0)
Monocytes Absolute: 0.8 10*3/uL (ref 0.1–1.0)
Monocytes Relative: 8 %
Neutro Abs: 7.6 10*3/uL (ref 1.7–7.7)
Neutrophils Relative %: 80 %
Platelets: 289 10*3/uL (ref 150–400)
RBC: 4.77 MIL/uL (ref 4.22–5.81)
RDW: 15.8 % — ABNORMAL HIGH (ref 11.5–15.5)
WBC: 9.5 10*3/uL (ref 4.0–10.5)
nRBC: 0 % (ref 0.0–0.2)

## 2020-08-01 LAB — GLUCOSE, CAPILLARY
Glucose-Capillary: 189 mg/dL — ABNORMAL HIGH (ref 70–99)
Glucose-Capillary: 338 mg/dL — ABNORMAL HIGH (ref 70–99)
Glucose-Capillary: 154 mg/dL — ABNORMAL HIGH (ref 70–99)
Glucose-Capillary: 157 mg/dL — ABNORMAL HIGH (ref 70–99)
Glucose-Capillary: 300 mg/dL — ABNORMAL HIGH (ref 70–99)

## 2020-08-01 LAB — COMPREHENSIVE METABOLIC PANEL
ALT: 18 U/L (ref 0–44)
AST: 15 U/L (ref 15–41)
Albumin: 3.3 g/dL — ABNORMAL LOW (ref 3.5–5.0)
Alkaline Phosphatase: 64 U/L (ref 38–126)
Anion gap: 6 (ref 5–15)
BUN: 30 mg/dL — ABNORMAL HIGH (ref 8–23)
CO2: 25 mmol/L (ref 22–32)
Calcium: 8.7 mg/dL — ABNORMAL LOW (ref 8.9–10.3)
Chloride: 106 mmol/L (ref 98–111)
Creatinine, Ser: 1.13 mg/dL (ref 0.61–1.24)
GFR, Estimated: >60 mL/min (ref >60)
Glucose, Bld: 251 mg/dL — ABNORMAL HIGH (ref 70–99)
Potassium: 4.7 mmol/L (ref 3.5–5.1)
Sodium: 137 mmol/L (ref 135–145)
Total Bilirubin: 0.4 mg/dL (ref 0.3–1.2)
Total Protein: 6.7 g/dL (ref 6.5–8.1)

## 2020-08-01 LAB — C-REACTIVE PROTEIN: CRP: 1.2 mg/dL — ABNORMAL HIGH (ref <1.0)

## 2020-08-01 LAB — MAGNESIUM: Magnesium: 2.4 mg/dL (ref 1.7–2.4)

## 2020-08-01 LAB — BRAIN NATRIURETIC PEPTIDE: B Natriuretic Peptide: 51.8 pg/mL (ref 0.0–100.0)

## 2020-08-01 LAB — FERRITIN: Ferritin: 25 ng/mL (ref 24–336)

## 2020-08-01 LAB — PHOSPHORUS: Phosphorus: 3 mg/dL (ref 2.5–4.6)

## 2020-08-01 LAB — D-DIMER, QUANTITATIVE: D-Dimer, Quant: 0.43 ug/mL-FEU (ref 0.00–0.50)

## 2020-08-01 MED ORDER — FUROSEMIDE 20 MG PO TABS
20.0000 mg | ORAL_TABLET | Freq: Once | ORAL | Status: AC
  Administered 2020-08-01: 20 mg via ORAL
  Filled 2020-08-01: qty 1

## 2020-08-01 MED ORDER — GLUCERNA SHAKE PO LIQD
237.0000 mL | Freq: Three times a day (TID) | ORAL | Status: DC
  Administered 2020-08-01 – 2020-08-02 (×3): 237 mL via ORAL

## 2020-08-01 MED ORDER — ADULT MULTIVITAMIN W/MINERALS CH
1.0000 | ORAL_TABLET | Freq: Every day | ORAL | Status: DC
  Administered 2020-08-02: 1 via ORAL
  Filled 2020-08-01: qty 1

## 2020-08-01 NOTE — Progress Notes (Signed)
PROGRESS NOTE    Bradley [name]  TOI:[id number] DOB: 04/27/1950 DOA: 07/29/2020 PCP: Valera Castle, MD    Brief Narrative:  Bradley [name] is a 70 y.o. male with medical history significant for COPD, chronic respiratory failure with hypoxia on 2 L supplemental O2 via [place], CAD, CKD stage IIIa, PAD, T2DM, HTN, HLD who presents to the ED for evaluation of generalized weakness, shortness of breath. He has been on 2 L supplemental O2 at all times but is now requiring 4 L via Bradley [name]. He has received previous COVID-19 vaccinations (1 J&J vaccine in April 2021, and 2 Pfizer vaccinations most recently April 2022).  He is a former smoker, quit in 2017. SARS-CoV-2 PCR is positive.  Influenza A/B PCR's are negative. CXR: Emphysematous changes with chronic scarring. Some new superimposed left basilar opacity is seen. This may represent some acute on chronic infiltrate. He is being treated for covid infection.      Consultants:     Procedures:   Antimicrobials:   Remdesivir    Subjective: Patient reports dyspnea on exertion.  He reports as long as he sits still he does not have shortness of breath.  Still requiring close to 4 L Bradley [name]  Objective: Vitals:   07/31/20 2326 08/01/20 0339 08/01/20 0841 08/01/20 1153  BP: 118/73 120/84 126/77 114/85  Pulse: 82 73 77 67  Resp: 20 16 20 20   Temp: 97.7 F (36.5 C) 97.6 F (36.4 C) (!) 97.5 F (36.4 C) 98 F (36.7 C)  TempSrc: Oral Oral Oral   SpO2: 97% 100% 97% 98%  Weight:      Height:        Intake/Output Summary (Last 24 hours) at 08/01/2020 1419 Last data filed at 07/31/2020 1702 Gross per 24 hour  Intake 200 ml  Output --  Net 200 ml   Filed Weights   07/29/20 1712  Weight: 70.3 kg    Examination: Nad, calm Increase expiratory time, decreased breath sounds no wheezing Regular S1-S2 no gallops Soft benign positive bowel sounds No edema Awake and alert oriented Mood and affect appropriate in current setting  Data  Reviewed: I have personally reviewed following labs and imaging studies  CBC: Recent Labs  Lab 07/29/20 1715 07/30/20 0529 07/31/20 0416 08/01/20 0511  WBC 13.9* 9.9 13.6* 9.5  NEUTROABS 11.5* 8.8* 11.4* 7.6  HGB 13.0 12.8* 11.8* 13.0  HCT 41.3 39.1 37.1* 40.2  MCV 85.9 84.6 85.1 84.3  PLT 254 266 271 833   Basic Metabolic Panel: Recent Labs  Lab 07/29/20 1715 07/30/20 0529 07/31/20 0416 08/01/20 0511  NA 138 137 139 137  K 3.6 4.0 4.7 4.7  CL 105 108 109 106  CO2 21* 22 23 25   GLUCOSE 177* 288* 213* 251*  BUN 19 18 27* 30*  CREATININE 1.30* 1.38* 1.19 1.13  CALCIUM 9.0 9.0 8.7* 8.7*  MG  --  2.3 2.5* 2.4  PHOS  --  2.7 2.7 3.0   GFR: Estimated Creatinine Clearance: 56.9 mL/min (by C-G formula based on SCr of 1.13 mg/dL). Liver Function Tests: Recent Labs  Lab 07/29/20 1715 07/30/20 0529 07/31/20 0416 08/01/20 0511  AST 17 27 13* 15  ALT 17 16 15 18   ALKPHOS 81 74 63 64  BILITOT 1.1 0.7 0.6 0.4  PROT 7.4 6.9 6.6 6.7  ALBUMIN 3.8 3.5 3.3* 3.3*   No results for input(s): LIPASE, AMYLASE in the last 168 hours. No results for input(s): AMMONIA in the last 168 hours. Coagulation Profile:  No results for input(s): INR, PROTIME in the last 168 hours. Cardiac Enzymes: No results for input(s): CKTOTAL, CKMB, CKMBINDEX, TROPONINI in the last 168 hours. BNP (last 3 results) No results for input(s): PROBNP in the last 8760 hours. HbA1C: No results for input(s): HGBA1C in the last 72 hours. CBG: Recent Labs  Lab 07/31/20 1201 07/31/20 1557 07/31/20 2121 08/01/20 0838 08/01/20 1154  GLUCAP 165* 168* 286* 189* 154*   Lipid Profile: No results for input(s): CHOL, HDL, LDLCALC, TRIG, CHOLHDL, LDLDIRECT in the last 72 hours. Thyroid Function Tests: No results for input(s): TSH, T4TOTAL, FREET4, T3FREE, THYROIDAB in the last 72 hours. Anemia Panel: Recent Labs    07/31/20 0416 08/01/20 0511  FERRITIN 27 25   Sepsis Labs: Recent Labs  Lab 07/30/20 0529   PROCALCITON 0.19    Recent Results (from the past 240 hour(s))  Resp Panel by RT-PCR (Flu A&B, Covid) Nasopharyngeal Swab     Status: Abnormal   Collection Time: 07/29/20  5:44 PM   Specimen: Nasopharyngeal Swab; Nasopharyngeal(NP) swabs in vial transport medium  Result Value Ref Range Status   SARS Coronavirus 2 by RT PCR POSITIVE (A) NEGATIVE Final    Comment: RESULT CALLED TO, READ BACK BY AND VERIFIED WITH: Bradley [name] @ 1917 on 07/29/2020 by CAF (NOTE) SARS-CoV-2 target nucleic acids are DETECTED.  The SARS-CoV-2 RNA is generally detectable in upper respiratory specimens during the acute phase of infection. Positive results are indicative of the presence of the identified virus, but do not rule out bacterial infection or co-infection with other pathogens not detected by the test. Clinical correlation with patient history and other diagnostic information is necessary to determine patient infection status. The expected result is Negative.  Fact Sheet for Patients: EntrepreneurPulse.com.au  Fact Sheet for Healthcare Providers: IncredibleEmployment.be  This test is not yet approved or cleared by the [place] FDA and  has been authorized for detection and/or diagnosis of SARS-CoV-2 by FDA under an Emergency Use Authorization (EUA).  This EUA will remain in effect (meaning this test c an be used) for the duration of  the COVID-19 declaration under Section 564(b)(1) of the Act, 21 U.S.C. section 360bbb-3(b)(1), unless the authorization is terminated or revoked sooner.     Influenza A by PCR NEGATIVE NEGATIVE Final   Influenza B by PCR NEGATIVE NEGATIVE Final    Comment: (NOTE) The Xpert Xpress SARS-CoV-2/FLU/RSV plus assay is intended as an aid in the diagnosis of influenza from Nasopharyngeal swab specimens and should not be used as a sole basis for treatment. Nasal washings and aspirates are unacceptable for Xpert Xpress  SARS-CoV-2/FLU/RSV testing.  Fact Sheet for Patients: EntrepreneurPulse.com.au  Fact Sheet for Healthcare Providers: IncredibleEmployment.be  This test is not yet approved or cleared by the [place] FDA and has been authorized for detection and/or diagnosis of SARS-CoV-2 by FDA under an Emergency Use Authorization (EUA). This EUA will remain in effect (meaning this test can be used) for the duration of the COVID-19 declaration under Section 564(b)(1) of the Act, 21 U.S.C. section 360bbb-3(b)(1), unless the authorization is terminated or revoked.  Performed at [facility], [address] [place], [place] [address]          Radiology Studies: No results found.      Scheduled Meds: . vitamin C  500 mg Oral Daily  . aspirin EC  81 mg Oral QPM  . enoxaparin (LOVENOX) injection  40 mg Subcutaneous Q24H  . insulin aspart  0-5 Units Subcutaneous QHS  .  insulin aspart  0-9 Units Subcutaneous TID WC  . insulin glargine  3 Units Subcutaneous Daily  . Ipratropium-Albuterol  1 puff Inhalation Q6H  . methylPREDNISolone (SOLU-MEDROL) injection  40 mg Intravenous Q12H  . metoprolol tartrate  25 mg Oral BID  . mometasone-formoterol  2 puff Inhalation BID  . montelukast  10 mg Oral QHS  . zinc sulfate  220 mg Oral Daily   Continuous Infusions: . remdesivir 100 mg in NS 100 mL 100 mg (08/01/20 0913)    Assessment & Plan:   Principal Problem:   Acute on chronic respiratory failure with hypoxia (HCC) Active Problems:   Type 2 diabetes mellitus with vascular disease ([place])   Coronary artery disease involving native coronary artery of native heart without angina pectoris   CKD (chronic kidney disease) stage 3, GFR 30-59 ml/min (HCC)   Hypertension associated with diabetes ([place])   Hyperlipidemia associated with type 2 diabetes mellitus ([place])   COVID-19 virus infection   Acute on chronic respiratory failure ([place])   XZAVIOR REINIG is a 70 y.o. male with medical history significant for COPD, chronic respiratory failure with hypoxia on 2 L supplemental O2 via [place], CAD, CKD stage IIIa, PAD, T2DM, HTN, HLD who is admitted with acute on chronic respiratory failure with hypoxia in the setting of COVID-19 infection.  Acute on chronic respiratory failure with hypoxia in setting of COVID-19 infection Covid infection: History of prior COVID-19 requiring hospitalization September 2021.  Has received previous vaccinations.  SARS-CoV-2 again positive 5/28 and symptomatic.  CXR with chronic emphysematous and scarring changes with possible new versus acute on chronic left lower lung infiltrate.  Currently requiring 4 L O2 via [place] compared to baseline of 2 L. 5/31 still dyspneic on exertion. CRP and D-dimer decreasing WBC down Monitor LFTs I-S, flutter valves, vitamin C and zinc Continue IV remdesivir and steroids Continue Combivent and inhalers On lasix at home, will give his lasix 20mg  x1 today     COPD: No active disease Continue inhalers and singular Continue Advair, Singulair and inhalers     Generalized weakness: Likely from COVID infection PT was consulted patient does not need follow-up with PT     Type 2 diabetes: BG improving Continue R-ISS Continue Lantus Continue to hold metformin     CKD stage IIIa: Stable  CAD HLD: Denies any chest pain.  Continue Lopressor, aspirin Holding statin since on remdesivir   Hypertension: BP normotensive Continue lower dose of metoprolol    DVT prophylaxis: lovenox Code Status:full Family Communication: none at bedside  Status is: Inpatient  Inpatient level of care appropriate due to severity of illness  Dispo: The patient is from: Home              Anticipated d/c is to: Home              Patient currently is not medically stable to d/c.   Difficult to place patient No   Still requiring more 02 , still with sob, not at baseline. Still  requiring iv meds         LOS: 2 days   Time spent: 35 min with >50% on coc    Nolberto Hanlon, MD [facility] Pager [phone]  If 7PM-7AM, please contact night-coverage 08/01/2020, 2:19 PM

## 2020-08-01 NOTE — Progress Notes (Signed)
Physical Therapy Treatment Patient Details Name: Bradley [name] MRN: [id number] DOB: [birth year] Today's Date: 08/01/2020    History of Present Illness Pt is a 70 y.o. male presenting to hospital 5/28 with SOB, weakness, nausea and vomiting.  PMH includes COPD (2 L home O2), CAD, DM, htn, AKI, PAD, OSA, diastasis recti.    PT Comments    Patient received sitting up in recliner, dressed in street clothes. He is feeling better per his report. Continues to be on 4 lpm O2. (Norm is 2 lpm). Patient is agreeable to PT session. Performs transfers independently. Ambulates without ad in room independently. Performed LE strengthening exercises with cues. He continues to be limited by sob with activity. Patient will continue to benefit from skilled PT while here to improve activity tolerance and strength for safe return home.      Follow Up Recommendations  No PT follow up     Equipment Recommendations  None recommended by PT    Recommendations for Other Services       Precautions / Restrictions Precautions Precautions: Fall Restrictions Weight Bearing Restrictions: No    Mobility  Bed Mobility               General bed mobility comments: Patient in recliner upon arrival    Transfers Overall transfer level: Independent Equipment used: None                Ambulation/Gait Ambulation/Gait assistance: Independent Gait Distance (Feet): 120 Feet Assistive device: None Gait Pattern/deviations: Step-through pattern Gait velocity: WNL   General Gait Details: seated rest between 2 sets of 60 feet due to sob. O2 sats remained > 89% on 4 lpm.   Stairs             Wheelchair Mobility    Modified Rankin (Stroke Patients Only)       Balance Overall balance assessment: Independent Sitting-balance support: Feet supported Sitting balance-Leahy Scale: Normal     Standing balance support: No upper extremity supported;During functional activity Standing balance-Leahy  Scale: Normal Standing balance comment: no loss of balance with ambulation noted                            Cognition Arousal/Alertness: Awake/alert Behavior During Therapy: WFL for tasks assessed/performed Overall Cognitive Status: Within Functional Limits for tasks assessed                                        Exercises Other Exercises Other Exercises: B LE: LAQ, Seated marching x 10 reps each, STS x 5    General Comments        Pertinent Vitals/Pain Pain Assessment: No/denies pain    Home Living                      Prior Function            PT Goals (current goals can now be found in the care plan section) Acute Rehab PT Goals Patient Stated Goal: to feel better and go home Time For Goal Achievement: 08/13/20 Potential to Achieve Goals: Good Progress towards PT goals: Progressing toward goals    Frequency    Min 2X/week      PT Plan Current plan remains appropriate    Co-evaluation              AM-PAC  PT "6 Clicks" Mobility   Outcome Measure  Help needed turning from your back to your side while in a flat bed without using bedrails?: None Help needed moving from lying on your back to sitting on the side of a flat bed without using bedrails?: None Help needed moving to and from a bed to a chair (including a wheelchair)?: None Help needed standing up from a chair using your arms (e.g., wheelchair or bedside chair)?: None Help needed to walk in hospital room?: None Help needed climbing 3-5 steps with a railing? : None 6 Click Score: 24    End of Session   Activity Tolerance: Patient tolerated treatment well Patient left: in chair;with call bell/phone within reach Nurse Communication: Mobility status PT Visit Diagnosis: Muscle weakness (generalized) (M62.81)     Time: 1638-4665 PT Time Calculation (min) (ACUTE ONLY): 17 min  Charges:  $Therapeutic Activity: 8-22 mins                     Shaman Muscarella, PT, GCS 08/01/20,1:57 PM

## 2020-08-01 NOTE — Progress Notes (Signed)
Initial Nutrition Assessment  DOCUMENTATION CODES:  Not applicable  INTERVENTION:   Liberalize diet to carb modified to maximize food choices  Glucerna Shake po TID, each supplement provides 220 kcal and 10 grams of protein  MVI with minerals daily  Request measured weight to access degree of weight loss  NUTRITION DIAGNOSIS:  Increased nutrient needs related to acute illness (COVID19 infection) as evidenced by estimated needs.  GOAL:  Patient will meet greater than or equal to 90% of their needs  MONITOR:  PO intake,Supplement acceptance,Labs  REASON FOR ASSESSMENT:  Malnutrition Screening Tool    ASSESSMENT:  Pt presented to ED with worsening SOB from his baseline with cough, congestion, and weakness. Found to be positive for COVID19. PMH relevant for COPD, CAD, DM type 2, and HTN. O2 at baseline and lives alone.  Discussed recent nutrition hx with pt. Pt reports an improving appetite since admission, but before coming in he had not eaten well in a week or two due to weakness and not feeling well. Pt reports an estimated 5 lb weight loss over that time period of poor intake. States that he has been eating well at breakfast, but that he does not have a menu in his room, thinks this would help with menu selection. Also agreeable to receiving glucerna during admission. Discussed in IDT rounds, MD estimates pt will need a few more days.   Nutritionally Relevant Medications  Scheduled Meds: . vitamin C  500 mg Oral Daily  . insulin aspart  0-5 Units Subcutaneous QHS  . insulin aspart  0-9 Units Subcutaneous TID WC  . insulin glargine  3 Units Subcutaneous Daily  . methylPREDNISolone (SOLU-MEDROL) injection  40 mg Intravenous Q12H  . zinc sulfate  220 mg Oral Daily   Continuous Infusions: . remdesivir 100 mg in NS 100 mL 100 mg (08/01/20 0913)   PRN Meds: ondansetron, senna-docusate  Labs reviewed:  SBG ranges from 165-286 mg/dL over the last 24 hours  HgbA1c 9.5%  (4/18)  NUTRITION - FOCUSED PHYSICAL EXAM: Deferred due to isolation status  Diet Order:   Diet Order            Diet Carb Modified Fluid consistency: Thin; Room service appropriate? Yes  Diet effective now                 EDUCATION NEEDS:  No education needs have been identified at this time  Skin:  Skin Assessment: Reviewed RN Assessment  Last BM:  5/28 per RN documentation  Height:  Ht Readings from Last 1 Encounters:  07/29/20 5\' 7"  (1.702 m)    Weight:  Wt Readings from Last 1 Encounters:  07/29/20 70.3 kg    Ideal Body Weight:  67.3 kg  BMI:  Body mass index is 24.28 kg/m.  Estimated Nutritional Needs:   Kcal:  1800-2000 kcal/d  Protein:  90-100g/d  Fluid:  >1.8L.d   Ranell Patrick, RD, LDN Clinical Dietitian Pager on JAARS

## 2020-08-02 DIAGNOSIS — J449 Chronic obstructive pulmonary disease, unspecified: Secondary | ICD-10-CM

## 2020-08-02 DIAGNOSIS — U071 COVID-19: Secondary | ICD-10-CM | POA: Diagnosis not present

## 2020-08-02 DIAGNOSIS — J9621 Acute and chronic respiratory failure with hypoxia: Secondary | ICD-10-CM | POA: Diagnosis not present

## 2020-08-02 DIAGNOSIS — N1831 Chronic kidney disease, stage 3a: Secondary | ICD-10-CM

## 2020-08-02 LAB — CBC WITH DIFFERENTIAL/PLATELET
Abs Immature Granulocytes: 0.05 10*3/uL (ref 0.00–0.07)
Basophils Absolute: 0 10*3/uL (ref 0.0–0.1)
Basophils Relative: 0 %
Eosinophils Absolute: 0 10*3/uL (ref 0.0–0.5)
Eosinophils Relative: 0 %
HCT: 43.4 % (ref 39.0–52.0)
Hemoglobin: 14.1 g/dL (ref 13.0–17.0)
Immature Granulocytes: 1 %
Lymphocytes Relative: 14 %
Lymphs Abs: 1.4 10*3/uL (ref 0.7–4.0)
MCH: 27.5 pg (ref 26.0–34.0)
MCHC: 32.5 g/dL (ref 30.0–36.0)
MCV: 84.6 fL (ref 80.0–100.0)
Monocytes Absolute: 0.8 10*3/uL (ref 0.1–1.0)
Monocytes Relative: 8 %
Neutro Abs: 7.7 10*3/uL (ref 1.7–7.7)
Neutrophils Relative %: 77 %
Platelets: 333 10*3/uL (ref 150–400)
RBC: 5.13 MIL/uL (ref 4.22–5.81)
RDW: 15.8 % — ABNORMAL HIGH (ref 11.5–15.5)
WBC: 10 10*3/uL (ref 4.0–10.5)
nRBC: 0 % (ref 0.0–0.2)

## 2020-08-02 LAB — COMPREHENSIVE METABOLIC PANEL
ALT: 23 U/L (ref 0–44)
AST: 16 U/L (ref 15–41)
Albumin: 3.7 g/dL (ref 3.5–5.0)
Alkaline Phosphatase: 67 U/L (ref 38–126)
Anion gap: 9 (ref 5–15)
BUN: 39 mg/dL — ABNORMAL HIGH (ref 8–23)
CO2: 26 mmol/L (ref 22–32)
Calcium: 9.2 mg/dL (ref 8.9–10.3)
Chloride: 102 mmol/L (ref 98–111)
Creatinine, Ser: 1.2 mg/dL (ref 0.61–1.24)
GFR, Estimated: >60 mL/min (ref >60)
Glucose, Bld: 248 mg/dL — ABNORMAL HIGH (ref 70–99)
Potassium: 4.9 mmol/L (ref 3.5–5.1)
Sodium: 137 mmol/L (ref 135–145)
Total Bilirubin: 0.5 mg/dL (ref 0.3–1.2)
Total Protein: 7.5 g/dL (ref 6.5–8.1)

## 2020-08-02 LAB — C-REACTIVE PROTEIN: CRP: 1 mg/dL — ABNORMAL HIGH (ref <1.0)

## 2020-08-02 LAB — D-DIMER, QUANTITATIVE: D-Dimer, Quant: 0.38 ug/mL-FEU (ref 0.00–0.50)

## 2020-08-02 LAB — GLUCOSE, CAPILLARY
Glucose-Capillary: 208 mg/dL — ABNORMAL HIGH (ref 70–99)
Glucose-Capillary: 179 mg/dL — ABNORMAL HIGH (ref 70–99)
Glucose-Capillary: 325 mg/dL — ABNORMAL HIGH (ref 70–99)

## 2020-08-02 LAB — PHOSPHORUS: Phosphorus: 3.3 mg/dL (ref 2.5–4.6)

## 2020-08-02 LAB — FERRITIN: Ferritin: 27 ng/mL (ref 24–336)

## 2020-08-02 LAB — MAGNESIUM: Magnesium: 2.6 mg/dL — ABNORMAL HIGH (ref 1.7–2.4)

## 2020-08-02 NOTE — TOC Progression Note (Signed)
Transition of Care [facility]) - Progression Note    Patient Details  Name: Bradley [name] MRN: [id number] Date of Birth: Apr 10, 1950  Transition of [facility]) CM/SW [place], RN Phone Number: 08/02/2020, 3:12 PM  Clinical Narrative:   Patient not medically cleared to discharge, COVID +, TOC to monitor for needs.    Expected Discharge Plan: Home/Self Care Barriers to Discharge: Continued Medical Work up (COVID +)  Expected Discharge Plan and Services Expected Discharge Plan: Home/Self Care   Discharge Planning Services: CM Consult Post Acute Care Choice: Durable Medical Equipment (Currently has home O2)                   DME Arranged:  (Oxygen arranged prior to admission)                     Social Determinants of Health (SDOH) Interventions    Readmission Risk Interventions Readmission Risk Prevention Plan 07/31/2020  Transportation Screening Complete  PCP or Specialist Appt within 3-5 Days Complete  HRI or Home Care Consult Complete  Social Work Consult for [place] Planning/Counseling Not Complete  SW consult not completed comments RNCM assigned to patient  Palliative Care Screening Not Applicable  Medication Review Press photographer) Complete  Some recent data might be hidden

## 2020-08-02 NOTE — Progress Notes (Signed)
Inpatient Diabetes Program Recommendations  AACE/ADA: New Consensus Statement on Inpatient Glycemic Control (2015)  Target Ranges:  Prepandial:   less than 140 mg/dL      Peak postprandial:   less than 180 mg/dL (1-2 hours)      Critically ill patients:  140 - 180 mg/dL   Lab Results  Component Value Date   GLUCAP 179 (H) 08/02/2020   HGBA1C 6.9 (H) 07/15/2016    Review of Glycemic Control [name], Bradley [name] (MRN [id number]) as of 08/02/2020 13:57  Ref. Range 08/01/2020 08:38 08/01/2020 11:54 08/01/2020 15:53 08/01/2020 21:05 08/02/2020 07:22 08/02/2020 12:01  Glucose-Capillary Latest Ref Range: 70 - 99 mg/dL 189 (H) 154 (H) 157 (H) 300 (H) 208 (H) 179 (H)   Diabetes history: DM 2 Outpatient Diabetes medications: metformin Current orders for Inpatient glycemic control: Lantus 3 units Novolog 0-9 units tid + hs  Solumedrol 40 mg Q12 hours Glucerna tid between meals  Inpatient Diabetes Program Recommendations:    - add Tradjenta 5 mg Daily   Thanks,  Tama Headings RN, MSN, BC-ADM Inpatient Diabetes Coordinator Team Pager [phone] (8a-5p)

## 2020-08-02 NOTE — Discharge Summary (Signed)
Discharge Summary  Bradley [name]:[id number] DOB: 12/20/1950  PCP: Valera Castle, MD  Admit date: 07/29/2020 Discharge date: 08/02/2020  Time spent: 25 minutes  Recommendations for Outpatient Follow-up:  1. Patient advised to stay in respiratory isolation until 6/7, 10 days following diagnosis.  Discharge Diagnoses:  Active Hospital Problems   Diagnosis Date Noted  . Acute on chronic respiratory failure with hypoxia ([place]) 07/29/2020  . Acute on chronic respiratory failure ([place]) 07/30/2020  . CKD (chronic kidney disease) stage 3, GFR 30-59 ml/min (HCC) 07/29/2020  . Hypertension associated with diabetes ([place]) 07/29/2020  . Hyperlipidemia associated with type 2 diabetes mellitus ([place]) 07/29/2020  . COVID-19 virus infection 07/29/2020  . Type 2 diabetes mellitus with vascular disease ([place]) 01/06/2020  . Coronary artery disease involving native coronary artery of native heart without angina pectoris 02/18/2017    Resolved Hospital Problems  No resolved problems to display.    Discharge Condition: Improved, being discharged home  Diet recommendation: Heart healthy, carb modified  Vitals:   08/02/20 1203 08/02/20 1700  BP: 127/82 133/79  Pulse: 66 72  Resp: 20   Temp: (!) 97.3 F (36.3 C) 98.1 F (36.7 C)  SpO2: 98% 98%    History of present illness:  70 year old male past medical history of COPD with chronic respiratory hypoxia on 2 L nasal cannula, stage III chronic kidney disease, CAD, diabetes mellitus and hypertension presented to the emergency room on 5/28 with complaints of generalized weakness and increased shortness of breath for the past few days.  He has been having to increase oxygen to 4 L nasal cannula.  Had received previous COVID vaccinations including J&J vaccine in April 2021 and 2 Pfizer vaccinations completed April 2022.  Former smoker, quit in 2017.  Noted to be positive for COVID.  Chest x-ray noted chronic emphysematous changes and scarring.   Brought and admitted to the hospitalist service.  Started on Remdisivir and steroids.  Hospital Course:  Principal Problem:   Acute on chronic respiratory failure with hypoxia (HCC) with chronic COPD and acute COVID-19 infection.  Patient responded to steroids and Remdisivir.  By 6/1, weaned down to home dose of oxygen, even with ambulation.  Seen by PT and OT who had no further recommendations.  CRP down to 1.0.  Patient felt to be stable and okay to be discharged.  He will need to stay on respiratory isolation for total of 10 days, ending on June 7. Active Problems:   Type 2 diabetes mellitus with vascular disease ([place]): Metformin held, put on Lantus, higher CBG secondary to steroids.  Steroids discontinued upon time of discharge   Coronary artery disease involving native coronary artery of native heart without angina pectoris   CKD (chronic kidney disease) stage 3, GFR 30-59 ml/min ([place]): Stable at baseline.   Hypertension associated with diabetes ([place])   Hyperlipidemia associated with type 2 diabetes mellitus ([place])   Procedures:  None  Consultations:  None  Discharge Exam: BP 133/79 (BP Location: Right Arm)   Pulse 72   Temp 98.1 F (36.7 C) (Oral)   Resp 20   Ht 5\' 7"  (1.702 m)   Wt 72.5 kg   SpO2 98%   BMI 25.03 kg/m   General: Alert and oriented x3, no acute distress Cardiovascular: Regular rate and rhythm, S1-S2 Respiratory: Clear to auscultation bilaterally  Discharge Instructions You were cared for by a hospitalist during your hospital stay. If you have any questions about your discharge medications or the care you received  while you were in the hospital after you are discharged, you can call the unit and asked to speak with the hospitalist on call if the hospitalist that took care of you is not available. Once you are discharged, your primary care physician will handle any further medical issues. Please note that NO REFILLS for any discharge medications will be  authorized once you are discharged, as it is imperative that you return to your primary care physician (or establish a relationship with a primary care physician if you do not have one) for your aftercare needs so that they can reassess your need for medications and monitor your lab values.  Discharge Instructions    Diet - low sodium heart healthy   Complete by: As directed    Increase activity slowly   Complete by: As directed      Allergies as of 08/02/2020   No Known Allergies     Medication List    STOP taking these medications   sildenafil 20 MG tablet Commonly known as: REVATIO     TAKE these medications   aspirin EC 81 MG tablet Take 81 mg by mouth every evening.   dextromethorphan-guaiFENesin 10-100 MG/5ML liquid Commonly known as: ROBITUSSIN-DM Take by mouth.   famotidine 20 MG tablet Commonly known as: PEPCID   Fluticasone-Salmeterol 250-50 MCG/DOSE Aepb Commonly known as: ADVAIR Inhale 1 puff into the lungs 2 (two) times daily.   furosemide 20 MG tablet Commonly known as: LASIX Take by mouth.   glucose blood test strip Use once daily. Use as instructed. DX: E11.9   levalbuterol 0.31 MG/3ML nebulizer solution Commonly known as: XOPENEX Inhale into the lungs.   metFORMIN 500 MG tablet Commonly known as: GLUCOPHAGE Take by mouth.   metoprolol tartrate 50 MG tablet Commonly known as: LOPRESSOR Take by mouth.   montelukast 10 MG tablet Commonly known as: SINGULAIR Take by mouth.   nitroGLYCERIN 0.4 MG SL tablet Commonly known as: NITROSTAT Place 0.4 mg under the tongue every 5 (five) minutes x 3 doses as needed for chest pain.   polyethylene glycol 17 g packet Commonly known as: MIRALAX / GLYCOLAX Take 17 g by mouth daily.   ProAir RespiClick 604 (90 Base) MCG/ACT Aepb Generic drug: Albuterol Sulfate INHALE 1 PUFF BY MOUTH EVERY 4 HOURS AS NEEDED FOR WHEEZING   rosuvastatin 40 MG tablet Commonly known as: CRESTOR Take 40 mg by mouth  daily.      No Known Allergies    The results of significant diagnostics from this hospitalization (including imaging, microbiology, ancillary and laboratory) are listed below for reference.    Significant Diagnostic Studies: DG Chest Portable 1 View  Result Date: 07/29/2020 CLINICAL DATA:  Shortness of breath and weakness EXAM: PORTABLE CHEST 1 VIEW COMPARISON:  06/02/20 FINDINGS: Cardiac shadow is within normal limits. Emphysematous changes are seen with diffuse scarring bilaterally. Some mild superimposed opacities are noted in the left lung base no pneumothorax is seen. No other focal abnormality is noted. IMPRESSION: Emphysematous changes with chronic scarring. Some new superimposed left basilar opacity is seen. This may represent some acute on chronic infiltrate. Electronically Signed   By: Inez Catalina M.D.   On: 07/29/2020 17:40    Microbiology: Recent Results (from the past 240 hour(s))  Resp Panel by RT-PCR (Flu A&B, Covid) Nasopharyngeal Swab     Status: Abnormal   Collection Time: 07/29/20  5:44 PM   Specimen: Nasopharyngeal Swab; Nasopharyngeal(NP) swabs in vial transport medium  Result Value Ref Range Status  SARS Coronavirus 2 by RT PCR POSITIVE (A) NEGATIVE Final    Comment: RESULT CALLED TO, READ BACK BY AND VERIFIED WITH: Darrel Hoover @ [phone] on 07/29/2020 by CAF (NOTE) SARS-CoV-2 target nucleic acids are DETECTED.  The SARS-CoV-2 RNA is generally detectable in upper respiratory specimens during the acute phase of infection. Positive results are indicative of the presence of the identified virus, but do not rule out bacterial infection or co-infection with other pathogens not detected by the test. Clinical correlation with patient history and other diagnostic information is necessary to determine patient infection status. The expected result is Negative.  Fact Sheet for Patients: EntrepreneurPulse.com.au  Fact Sheet for Healthcare  Providers: IncredibleEmployment.be  This test is not yet approved or cleared by the [place] FDA and  has been authorized for detection and/or diagnosis of SARS-CoV-2 by FDA under an Emergency Use Authorization (EUA).  This EUA will remain in effect (meaning this test c an be used) for the duration of  the COVID-19 declaration under Section 564(b)(1) of the Act, 21 U.S.C. section 360bbb-3(b)(1), unless the authorization is terminated or revoked sooner.     Influenza A by PCR NEGATIVE NEGATIVE Final   Influenza B by PCR NEGATIVE NEGATIVE Final    Comment: (NOTE) The Xpert Xpress SARS-CoV-2/FLU/RSV plus assay is intended as an aid in the diagnosis of influenza from Nasopharyngeal swab specimens and should not be used as a sole basis for treatment. Nasal washings and aspirates are unacceptable for Xpert Xpress SARS-CoV-2/FLU/RSV testing.  Fact Sheet for Patients: EntrepreneurPulse.com.au  Fact Sheet for Healthcare Providers: IncredibleEmployment.be  This test is not yet approved or cleared by the [place] FDA and has been authorized for detection and/or diagnosis of SARS-CoV-2 by FDA under an Emergency Use Authorization (EUA). This EUA will remain in effect (meaning this test can be used) for the duration of the COVID-19 declaration under Section 564(b)(1) of the Act, 21 U.S.C. section 360bbb-3(b)(1), unless the authorization is terminated or revoked.  Performed at [facility], [address] [place], [place] [address]      Labs: Basic Metabolic Panel: Recent Labs  Lab 07/29/20 1715 07/30/20 0529 07/31/20 0416 08/01/20 0511 08/02/20 0546  NA 138 137 139 137 137  K 3.6 4.0 4.7 4.7 4.9  CL 105 108 109 106 102  CO2 21* 22 23 25 26   GLUCOSE 177* 288* 213* 251* 248*  BUN 19 18 27* 30* 39*  CREATININE 1.30* 1.38* 1.19 1.13 1.20  CALCIUM 9.0 9.0 8.7* 8.7* 9.2  MG  --  2.3 2.5* 2.4 2.6*  PHOS  --   2.7 2.7 3.0 3.3   Liver Function Tests: Recent Labs  Lab 07/29/20 1715 07/30/20 0529 07/31/20 0416 08/01/20 0511 08/02/20 0546  AST 17 27 13* 15 16  ALT 17 16 15 18 23   ALKPHOS 81 74 63 64 67  BILITOT 1.1 0.7 0.6 0.4 0.5  PROT 7.4 6.9 6.6 6.7 7.5  ALBUMIN 3.8 3.5 3.3* 3.3* 3.7   No results for input(s): LIPASE, AMYLASE in the last 168 hours. No results for input(s): AMMONIA in the last 168 hours. CBC: Recent Labs  Lab 07/29/20 1715 07/30/20 0529 07/31/20 0416 08/01/20 0511 08/02/20 0546  WBC 13.9* 9.9 13.6* 9.5 10.0  NEUTROABS 11.5* 8.8* 11.4* 7.6 7.7  HGB 13.0 12.8* 11.8* 13.0 14.1  HCT 41.3 39.1 37.1* 40.2 43.4  MCV 85.9 84.6 85.1 84.3 84.6  PLT 254 266 271 289 333   Cardiac Enzymes: No results for input(s): CKTOTAL,  CKMB, CKMBINDEX, TROPONINI in the last 168 hours. BNP: BNP (last 3 results) Recent Labs    08/01/20 0506  BNP 51.8    ProBNP (last 3 results) No results for input(s): PROBNP in the last 8760 hours.  CBG: Recent Labs  Lab 08/01/20 1553 08/01/20 2105 08/02/20 0722 08/02/20 1201 08/02/20 1724  GLUCAP 157* 300* 208* 179* 325*       Signed:  Annita Brod, MD [facility] 08/02/2020, 5:26 PM

## 2020-10-12 ENCOUNTER — Ambulatory Visit: Payer: Medicare HMO | Admitting: Podiatry

## 2020-10-12 ENCOUNTER — Encounter: Payer: Self-pay | Admitting: Podiatry

## 2020-10-12 ENCOUNTER — Other Ambulatory Visit: Payer: Self-pay

## 2020-10-12 DIAGNOSIS — E1159 Type 2 diabetes mellitus with other circulatory complications: Secondary | ICD-10-CM

## 2020-10-12 DIAGNOSIS — N179 Acute kidney failure, unspecified: Secondary | ICD-10-CM

## 2020-10-12 DIAGNOSIS — M79675 Pain in left toe(s): Secondary | ICD-10-CM

## 2020-10-12 DIAGNOSIS — M79674 Pain in right toe(s): Secondary | ICD-10-CM

## 2020-10-12 DIAGNOSIS — B351 Tinea unguium: Secondary | ICD-10-CM | POA: Diagnosis not present

## 2020-10-12 NOTE — Progress Notes (Signed)
This patient returns to my office for at risk foot care.  This patient requires this care by a professional since this patient will be at risk due to having diabetes.  This patient is unable to cut nails himself since the patient cannot reach his nails.These nails are painful walking and wearing shoes.  This patient presents for at risk foot care today.  General Appearance  Alert, conversant and in no acute stress.  Vascular  Dorsalis pedis and posterior tibial  pulses are weakly/absent  palpable  bilaterally.  Capillary return is within normal limits  bilaterally. Cold feet  B/L. bilaterally.  Neurologic  Senn-Weinstein monofilament wire test within normal limits  bilaterally. Muscle power within normal limits bilaterally.  Nails Thick disfigured discolored nails with subungual debris  from hallux to fifth toes bilaterally. No evidence of bacterial infection or drainage bilaterally.  Orthopedic  No limitations of motion  feet .  No crepitus or effusions noted. Cavus foot type.  Hammer toes 2-5  B/L.  Skin  normotropic skin  noted bilaterally.  No signs of infections or ulcers noted. Asymptomatic callus sub 5th left foot.    Onychomycosis  Pain in right toes  Pain in left toes    Consent was obtained for treatment procedures.   Mechanical debridement of nails 1-5  bilaterally performed with a nail nipper.  Filed with dremel without incident.   Return office visit    3 months                  Told patient to return for periodic foot care and evaluation due to potential at risk complications.   Gardiner Barefoot DPM

## 2020-11-21 ENCOUNTER — Other Ambulatory Visit: Payer: Self-pay | Admitting: Specialist

## 2020-11-21 DIAGNOSIS — R0600 Dyspnea, unspecified: Secondary | ICD-10-CM

## 2020-11-21 DIAGNOSIS — R918 Other nonspecific abnormal finding of lung field: Secondary | ICD-10-CM

## 2020-11-21 DIAGNOSIS — R0609 Other forms of dyspnea: Secondary | ICD-10-CM

## 2020-11-22 ENCOUNTER — Emergency Department: Payer: Medicare HMO

## 2020-11-22 ENCOUNTER — Other Ambulatory Visit: Payer: Self-pay

## 2020-11-22 ENCOUNTER — Encounter: Payer: Self-pay | Admitting: Emergency Medicine

## 2020-11-22 ENCOUNTER — Emergency Department
Admission: EM | Admit: 2020-11-22 | Discharge: 2020-11-22 | Disposition: A | Discharge status: Home / Self Care | Payer: Medicare HMO | Attending: Emergency Medicine | Admitting: Emergency Medicine

## 2020-11-22 DIAGNOSIS — R55 Syncope and collapse: Secondary | ICD-10-CM | POA: Insufficient documentation

## 2020-11-22 DIAGNOSIS — R42 Dizziness and giddiness: Secondary | ICD-10-CM | POA: Insufficient documentation

## 2020-11-22 DIAGNOSIS — Z79899 Other long term (current) drug therapy: Secondary | ICD-10-CM | POA: Diagnosis not present

## 2020-11-22 DIAGNOSIS — Z8616 Personal history of COVID-19: Secondary | ICD-10-CM | POA: Diagnosis not present

## 2020-11-22 DIAGNOSIS — E1122 Type 2 diabetes mellitus with diabetic chronic kidney disease: Secondary | ICD-10-CM | POA: Insufficient documentation

## 2020-11-22 DIAGNOSIS — R1013 Epigastric pain: Secondary | ICD-10-CM | POA: Diagnosis not present

## 2020-11-22 DIAGNOSIS — Z87891 Personal history of nicotine dependence: Secondary | ICD-10-CM | POA: Insufficient documentation

## 2020-11-22 DIAGNOSIS — I129 Hypertensive chronic kidney disease with stage 1 through stage 4 chronic kidney disease, or unspecified chronic kidney disease: Secondary | ICD-10-CM | POA: Insufficient documentation

## 2020-11-22 DIAGNOSIS — Z7982 Long term (current) use of aspirin: Secondary | ICD-10-CM | POA: Insufficient documentation

## 2020-11-22 DIAGNOSIS — J449 Chronic obstructive pulmonary disease, unspecified: Secondary | ICD-10-CM | POA: Insufficient documentation

## 2020-11-22 DIAGNOSIS — Z7984 Long term (current) use of oral hypoglycemic drugs: Secondary | ICD-10-CM | POA: Insufficient documentation

## 2020-11-22 DIAGNOSIS — I2510 Atherosclerotic heart disease of native coronary artery without angina pectoris: Secondary | ICD-10-CM | POA: Diagnosis not present

## 2020-11-22 DIAGNOSIS — N183 Chronic kidney disease, stage 3 unspecified: Secondary | ICD-10-CM | POA: Insufficient documentation

## 2020-11-22 LAB — BASIC METABOLIC PANEL
Anion gap: 9 (ref 5–15)
BUN: 20 mg/dL (ref 8–23)
CO2: 29 mmol/L (ref 22–32)
Calcium: 9.1 mg/dL (ref 8.9–10.3)
Chloride: 103 mmol/L (ref 98–111)
Creatinine, Ser: 1.04 mg/dL (ref 0.61–1.24)
GFR, Estimated: >60 mL/min (ref >60)
Glucose, Bld: 167 mg/dL — ABNORMAL HIGH (ref 70–99)
Potassium: 4.5 mmol/L (ref 3.5–5.1)
Sodium: 141 mmol/L (ref 135–145)

## 2020-11-22 LAB — CBC
HCT: 43 % (ref 39.0–52.0)
Hemoglobin: 13.9 g/dL (ref 13.0–17.0)
MCH: 27.4 pg (ref 26.0–34.0)
MCHC: 32.3 g/dL (ref 30.0–36.0)
MCV: 84.6 fL (ref 80.0–100.0)
Platelets: 283 10*3/uL (ref 150–400)
RBC: 5.08 MIL/uL (ref 4.22–5.81)
RDW: 16.3 % — ABNORMAL HIGH (ref 11.5–15.5)
WBC: 10.2 10*3/uL (ref 4.0–10.5)
nRBC: 0 % (ref 0.0–0.2)

## 2020-11-22 LAB — TROPONIN I (HIGH SENSITIVITY): Troponin I (High Sensitivity): 6 ng/L (ref <18)

## 2020-11-22 NOTE — ED Triage Notes (Signed)
Pt comes into the ED via EMS from [facility] sudden onset of dizziness with near syncope  while eating dinner.  CBG213 126/68 82HR 94% on 2L [place] continuous from home #[id number]

## 2020-11-22 NOTE — Discharge Instructions (Addendum)
Your blood work, EKG and chest x-ray were reassuring today.  You may have had a vasovagal syncopal episode.  Please follow-up with your primary care provider.  If you have recurrent symptoms or you develop any new symptoms that are concerning to you including chest pain, please return to the emergency department.

## 2020-11-22 NOTE — ED Provider Notes (Signed)
[facility]  ____________________________________________   Event Date/Time   First MD Initiated Contact with Patient 11/22/20 2056     (approximate)  I have reviewed the triage vital signs and the nursing notes.   HISTORY  Chief Complaint Near Syncope    HPI Bradley [name] is a 70 y.o. male with past medical history of COPD on 2 L nasal cannula at baseline, CAD, diabetes, hypertension who presents with a self-limited episode of lightheadedness.  Patient was in his normal state of health he was sitting down eating when he suddenly felt like he was going to pass out.  He describes it as like how you feel when you see a needle.  He denies associated dyspnea, palpitations or chest pain.  He has been feeling well since the episode.  Denies numbness, weakness or diplopia.  Has had some intermittent left lower quadrant pain without associated nausea, vomiting, diarrhea and is still tolerating p.o.         Past Medical History:  Diagnosis Date   COPD (chronic obstructive pulmonary disease) ([place])    Coronary artery disease    Diabetes mellitus without complication ([place])    Hypertension     Patient Active Problem List   Diagnosis Date Noted   Acute on chronic respiratory failure ([place]) 07/30/2020   Acute on chronic respiratory failure with hypoxia ([place]) 07/29/2020   CKD (chronic kidney disease) stage 3, GFR 30-59 ml/min ([place]) 07/29/2020   Hypertension associated with diabetes ([place]) 07/29/2020   Hyperlipidemia associated with type 2 diabetes mellitus ([place]) 07/29/2020   COVID-19 virus infection 07/29/2020   Callus 07/13/2020   Pain due to onychomycosis of toenails of both feet 01/06/2020   Type 2 diabetes mellitus with vascular disease ([place]) 01/06/2020   Chronic renal failure, stage 2 (mild) 12/01/2019   Lightheadedness 11/16/2019   Acute kidney injury ([place]) 04/20/2019   PAD (peripheral artery disease) ([place]) 04/20/2019   Tachycardia 04/20/2019   B12  deficiency 12/07/2017   OSA (obstructive sleep apnea) 11/28/2017   Lower extremity pain, left 03/06/2017   Numbness and tingling of left leg 03/06/2017   Coronary artery disease involving native coronary artery of native heart without angina pectoris 02/18/2017   Diastasis recti 02/18/2017   CVD (cardiovascular disease) 02/18/2017   Need for vaccination 24/58/0998   Umbilical hernia without obstruction and without gangrene 02/18/2017   Hemoglobinuria 08/21/2016   Anemia 08/07/2016   Pneumonia 07/15/2016   Lung mass 06/28/2016   History of cigarette smoking 06/19/2016   Screening for malignant neoplasm of respiratory organ 06/19/2016   Acute respiratory failure with hypoxia ([place]) 12/27/2015   Community acquired pneumonia 12/27/2015   Acute respiratory distress 09/19/2015   Adjustment disorder with mixed anxiety and depressed mood 04/14/2015   Insomnia 04/14/2015   Grief 04/14/2015   Hyponatremia 04/12/2015   COPD exacerbation ([place]) 10/08/2014   CAP (community acquired pneumonia) 10/08/2014   Hypogammaglobulinemia ([place]) 08/17/2014   Leukocytosis 08/03/2014   Recurrent infections 08/03/2014   Tinnitus of right ear 03/03/2014   Left carotid artery stenosis 07/21/2013   HLD (hyperlipidemia) 10/05/2010   Onychomycosis 10/05/2010    Past Surgical History:  Procedure Laterality Date   CAROTID STENT Left    LEFT HEART CATH AND CORONARY ANGIOGRAPHY Left 11/02/2019   Procedure: LEFT HEART CATH AND CORONARY ANGIOGRAPHY;  Surgeon: Teodoro Spray, MD;  Location: [facility];  Service: Cardiovascular;  Laterality: Left;   LUNG BIOPSY Right    2006   right side  partial lobe removed       Prior to Admission medications   Medication Sig Start Date End Date Taking? Authorizing Provider  Albuterol Sulfate (PROAIR RESPICLICK) 751 (90 Base) MCG/ACT AEPB INHALE 1 PUFF BY MOUTH EVERY 4 HOURS AS NEEDED FOR WHEEZING 04/13/20   [provider]  aspirin EC 81 MG tablet Take 81 mg  by mouth every evening.     [provider]  dextromethorphan-guaiFENesin (ROBITUSSIN-DM) 10-100 MG/5ML liquid Take by mouth. 11/21/19   [provider]  famotidine (PEPCID) 20 MG tablet  02/17/20   [provider]  Fluticasone-Salmeterol (ADVAIR) 250-50 MCG/DOSE AEPB Inhale 1 puff into the lungs 2 (two) times daily. 09/20/15   Hillary Bow, MD  furosemide (LASIX) 20 MG tablet Take by mouth. 11/21/19   [provider]  glucose blood test strip Use once daily. Use as instructed. DX: E11.9 10/08/16   [provider]  levalbuterol Penne Lash) 0.31 MG/3ML nebulizer solution Inhale into the lungs. 12/22/19 12/21/20  [provider]  metFORMIN (GLUCOPHAGE) 500 MG tablet Take by mouth. 11/21/19 10/10/20  [provider]  metoprolol tartrate (LOPRESSOR) 50 MG tablet Take by mouth. 11/21/19   [provider]  nitroGLYCERIN (NITROSTAT) 0.4 MG SL tablet Place 0.4 mg under the tongue every 5 (five) minutes x 3 doses as needed for chest pain.     [provider]  polyethylene glycol (MIRALAX / GLYCOLAX) 17 g packet Take 17 g by mouth daily.    [provider]  rosuvastatin (CRESTOR) 40 MG tablet Take 40 mg by mouth daily.  08/05/19   [provider]    Allergies Patient has no known allergies.  Family History  Problem Relation Age of Onset   Cancer Mother    Heart attack Father    Cancer Sister     Social History Social History   Tobacco Use   Smoking status: Former    Packs/day: 1.00    Years: 52.00    Pack years: 52.00    Types: Cigarettes    Quit date: 06/18/2016    Years since quitting: 4.4   Smokeless tobacco: Never  Vaping Use   Vaping Use: Never used  Substance Use Topics   Alcohol use: Yes    Alcohol/week: 4.0 standard drinks    Types: 4 Shots of liquor per week    Comment: has rum every other day   Drug use: No    Review of Systems   Review of Systems  Constitutional:  Negative for  chills and fever.  Respiratory:  Negative for shortness of breath.   Cardiovascular:  Negative for chest pain.  Gastrointestinal:  Negative for abdominal pain, nausea and vomiting.  Genitourinary:  Negative for dysuria.  Neurological:  Positive for light-headedness. Negative for weakness and numbness.  All other systems reviewed and are negative.  Physical Exam Updated Vital Signs BP 123/78 (BP Location: Left Arm)   Pulse 79   Temp 98.8 F (37.1 C) (Oral)   Resp 20   Ht 5\' 7"  (1.702 m)   Wt 72.5 kg   SpO2 96%   BMI 25.03 kg/m   Physical Exam Vitals and nursing note reviewed.  Constitutional:      General: He is not in acute distress.    Appearance: Normal appearance.  HENT:     Head: Normocephalic and atraumatic.     Nose: Nose normal. No congestion.  Eyes:     General: No scleral icterus.    Conjunctiva/sclera: Conjunctivae normal.  Pulmonary:     Effort: Pulmonary effort is normal. No respiratory distress.     Breath sounds: Normal breath sounds. No wheezing.  Abdominal:     General: Abdomen is flat.     Palpations: Abdomen is soft.     Comments: Minimal tenderness to palpation epigastric region, no tenderness in the bilateral lower quadrants  Musculoskeletal:        General: No deformity or signs of injury.     Cervical back: Normal range of motion.  Skin:    Coloration: Skin is not jaundiced or pale.  Neurological:     General: No focal deficit present.     Mental Status: He is alert and oriented to person, place, and time. Mental status is at baseline.  Psychiatric:        Mood and Affect: Mood normal.        Behavior: Behavior normal.     LABS (all labs ordered are listed, but only abnormal results are displayed)  Labs Reviewed  CBC - Abnormal; Notable for the following components:      Result Value   RDW 16.3 (*)    All other components within normal limits  BASIC METABOLIC PANEL - Abnormal; Notable for the following components:   Glucose, Bld 167  (*)    All other components within normal limits  URINALYSIS, COMPLETE (UACMP) WITH MICROSCOPIC  CBG MONITORING, ED  TROPONIN I (HIGH SENSITIVITY)   ____________________________________________  EKG Right bundle branch block, left anterior fascicular block, no acute ischemic changes ____________________________________________  RADIOLOGY Almeta Monas, personally viewed and evaluated these images (plain radiographs) as part of my medical decision making, as well as reviewing the written report by the radiologist.  ED MD interpretation: I reviewed the chest x-ray which is hyperinflated but without other acute cardiopulmonary process    ____________________________________________   PROCEDURES  Procedure(s) performed (including Critical Care):  Procedures   ____________________________________________   INITIAL IMPRESSION / ASSESSMENT AND PLAN / ED COURSE     70 yo male presents after a near syncopal episode. Occurred whil pt was eating and had prodrome, which he describes as like when you have a blood draw. He otherwise had no associated symptoms. He appears well. Did complain secondarily of intermittent LLQ, but his abdomen is benign on my exam, low suspicion for surgical process.  Labs obtained including trop are wnl. EKG in non-ischemic, similar to prior. He is currently asymptomatic. Given the prodrome and no other associated symptoms I favor vasovagal syncope. Arrhythmia certainly possible, but with his otherwise reassuring work-up, I think he is stable for dc. We discussed return precautions for recurrent episodes, CP, SOB etc.  Clinical Course as of 11/23/20 0007  Wed Nov 22, 2020  2158 Troponin I (High Sensitivity): 6 [KM]    Clinical Course User Index [KM] Rada Hay, MD     ____________________________________________   FINAL CLINICAL IMPRESSION(S) / ED DIAGNOSES  Final diagnoses:  Near syncope     ED Discharge Orders     None         Note:  This document was prepared using Dragon voice recognition software and may include unintentional dictation errors.    Rada Hay, MD 11/23/20 [phone]

## 2020-11-22 NOTE — ED Provider Notes (Signed)
Emergency Medicine Provider Triage Evaluation Note  Bradley [name] , a 70 y.o. male  was evaluated in triage.  Pt complains of near syncope and dizziness. He was at a [employer] when symptoms occurred. He presents via EMS for evaluation of his symptoms.   Review of Systems  Positive: dizziness Negative: CP/SOB  Physical Exam  BP 123/78 (BP Location: Left Arm)   Pulse 79   Temp 98.8 F (37.1 C) (Oral)   Resp 20   Ht 5\' 7"  (1.702 m)   Wt 72.5 kg   SpO2 96%   BMI 25.03 kg/m  Gen:   Awake, no distress  NAD Resp:  Normal effort  MSK:   Moves extremities without difficulty  Other:  CVS RRR  Medical Decision Making  Medically screening exam initiated at 6:53 PM.  Appropriate orders placed.  Bradley [name] was informed that the remainder of the evaluation will be completed by another provider, this initial triage assessment does not replace that evaluation, and the importance of remaining in the ED until their evaluation is complete.  Patient with ED evaluation of dizziness and near-syncope.    Melvenia Needles, PA-C 11/22/20 1901    Vladimir Crofts, MD 11/22/20 [phone]

## 2020-12-06 ENCOUNTER — Ambulatory Visit
Admission: RE | Admit: 2020-12-06 | Discharge: 2020-12-06 | Disposition: A | Discharge status: Home / Self Care | Payer: Medicare HMO | Source: Ambulatory Visit | Attending: Specialist | Admitting: Specialist

## 2020-12-06 ENCOUNTER — Other Ambulatory Visit: Payer: Self-pay

## 2020-12-06 DIAGNOSIS — R0609 Other forms of dyspnea: Secondary | ICD-10-CM | POA: Diagnosis present

## 2020-12-06 DIAGNOSIS — R918 Other nonspecific abnormal finding of lung field: Secondary | ICD-10-CM | POA: Insufficient documentation

## 2021-01-01 ENCOUNTER — Other Ambulatory Visit: Payer: Self-pay | Admitting: Specialist

## 2021-01-01 DIAGNOSIS — R911 Solitary pulmonary nodule: Secondary | ICD-10-CM

## 2021-01-18 ENCOUNTER — Encounter: Payer: Self-pay | Admitting: Podiatry

## 2021-01-18 ENCOUNTER — Other Ambulatory Visit: Payer: Self-pay

## 2021-01-18 ENCOUNTER — Ambulatory Visit: Payer: Medicare HMO | Admitting: Podiatry

## 2021-01-18 DIAGNOSIS — E1159 Type 2 diabetes mellitus with other circulatory complications: Secondary | ICD-10-CM

## 2021-01-18 DIAGNOSIS — N1831 Chronic kidney disease, stage 3a: Secondary | ICD-10-CM

## 2021-01-18 DIAGNOSIS — B351 Tinea unguium: Secondary | ICD-10-CM | POA: Diagnosis not present

## 2021-01-18 DIAGNOSIS — M79675 Pain in left toe(s): Secondary | ICD-10-CM

## 2021-01-18 DIAGNOSIS — N179 Acute kidney failure, unspecified: Secondary | ICD-10-CM | POA: Diagnosis not present

## 2021-01-18 DIAGNOSIS — M79674 Pain in right toe(s): Secondary | ICD-10-CM

## 2021-01-18 DIAGNOSIS — I739 Peripheral vascular disease, unspecified: Secondary | ICD-10-CM

## 2021-01-18 NOTE — Progress Notes (Signed)
This patient returns to my office for at risk foot care.  This patient requires this care by a professional since this patient will be at risk due to having diabetes.  This patient is unable to cut nails himself since the patient cannot reach his nails.These nails are painful walking and wearing shoes.  This patient presents for at risk foot care today.  General Appearance  Alert, conversant and in no acute stress.  Vascular  Dorsalis pedis and posterior tibial  pulses are weakly/absent  palpable  bilaterally.  Capillary return is within normal limits  bilaterally. Cold feet  B/L. bilaterally.  Neurologic  Senn-Weinstein monofilament wire test within normal limits  bilaterally. Muscle power within normal limits bilaterally.  Nails Thick disfigured discolored nails with subungual debris  from hallux to fifth toes bilaterally. No evidence of bacterial infection or drainage bilaterally.  Orthopedic  No limitations of motion  feet .  No crepitus or effusions noted. Cavus foot type.  Hammer toes 2-5  B/L.  Skin  normotropic skin  noted bilaterally.  No signs of infections or ulcers noted. Asymptomatic callus B/L.  Onychomycosis  Pain in right toes  Pain in left toes    Consent was obtained for treatment procedures.   Mechanical debridement of nails 1-5  bilaterally performed with a nail nipper.  Filed with dremel without incident.   Return office visit    3 months                  Told patient to return for periodic foot care and evaluation due to potential at risk complications.   Gardiner Barefoot DPM

## 2021-02-05 ENCOUNTER — Encounter: Payer: Self-pay | Admitting: Emergency Medicine

## 2021-02-05 ENCOUNTER — Emergency Department: Payer: Medicare HMO

## 2021-02-05 ENCOUNTER — Inpatient Hospital Stay
Admission: EM | Admit: 2021-02-05 | Discharge: 2021-02-09 | DRG: 871 | Disposition: A | Discharge status: Home / Self Care | Payer: Medicare HMO | Attending: Internal Medicine | Admitting: Internal Medicine

## 2021-02-05 ENCOUNTER — Other Ambulatory Visit: Payer: Self-pay

## 2021-02-05 DIAGNOSIS — G4733 Obstructive sleep apnea (adult) (pediatric): Secondary | ICD-10-CM | POA: Diagnosis present

## 2021-02-05 DIAGNOSIS — J189 Pneumonia, unspecified organism: Secondary | ICD-10-CM | POA: Diagnosis not present

## 2021-02-05 DIAGNOSIS — J441 Chronic obstructive pulmonary disease with (acute) exacerbation: Secondary | ICD-10-CM | POA: Diagnosis present

## 2021-02-05 DIAGNOSIS — B9789 Other viral agents as the cause of diseases classified elsewhere: Secondary | ICD-10-CM | POA: Diagnosis present

## 2021-02-05 DIAGNOSIS — K219 Gastro-esophageal reflux disease without esophagitis: Secondary | ICD-10-CM | POA: Diagnosis present

## 2021-02-05 DIAGNOSIS — J1289 Other viral pneumonia: Secondary | ICD-10-CM | POA: Diagnosis not present

## 2021-02-05 DIAGNOSIS — A419 Sepsis, unspecified organism: Principal | ICD-10-CM | POA: Diagnosis present

## 2021-02-05 DIAGNOSIS — Z7982 Long term (current) use of aspirin: Secondary | ICD-10-CM

## 2021-02-05 DIAGNOSIS — I2510 Atherosclerotic heart disease of native coronary artery without angina pectoris: Secondary | ICD-10-CM | POA: Diagnosis present

## 2021-02-05 DIAGNOSIS — J9620 Acute and chronic respiratory failure, unspecified whether with hypoxia or hypercapnia: Secondary | ICD-10-CM | POA: Diagnosis present

## 2021-02-05 DIAGNOSIS — J9621 Acute and chronic respiratory failure with hypoxia: Secondary | ICD-10-CM | POA: Diagnosis present

## 2021-02-05 DIAGNOSIS — J440 Chronic obstructive pulmonary disease with acute lower respiratory infection: Secondary | ICD-10-CM | POA: Diagnosis present

## 2021-02-05 DIAGNOSIS — I10 Essential (primary) hypertension: Secondary | ICD-10-CM | POA: Diagnosis present

## 2021-02-05 DIAGNOSIS — E1165 Type 2 diabetes mellitus with hyperglycemia: Secondary | ICD-10-CM | POA: Diagnosis present

## 2021-02-05 DIAGNOSIS — I4510 Unspecified right bundle-branch block: Secondary | ICD-10-CM | POA: Diagnosis present

## 2021-02-05 DIAGNOSIS — F1020 Alcohol dependence, uncomplicated: Secondary | ICD-10-CM | POA: Diagnosis not present

## 2021-02-05 DIAGNOSIS — Z79899 Other long term (current) drug therapy: Secondary | ICD-10-CM

## 2021-02-05 DIAGNOSIS — D72829 Elevated white blood cell count, unspecified: Secondary | ICD-10-CM

## 2021-02-05 DIAGNOSIS — E1169 Type 2 diabetes mellitus with other specified complication: Secondary | ICD-10-CM | POA: Diagnosis present

## 2021-02-05 DIAGNOSIS — Z8249 Family history of ischemic heart disease and other diseases of the circulatory system: Secondary | ICD-10-CM

## 2021-02-05 DIAGNOSIS — D649 Anemia, unspecified: Secondary | ICD-10-CM | POA: Diagnosis present

## 2021-02-05 DIAGNOSIS — Z20822 Contact with and (suspected) exposure to covid-19: Secondary | ICD-10-CM | POA: Diagnosis present

## 2021-02-05 DIAGNOSIS — G4700 Insomnia, unspecified: Secondary | ICD-10-CM | POA: Diagnosis present

## 2021-02-05 DIAGNOSIS — E782 Mixed hyperlipidemia: Secondary | ICD-10-CM

## 2021-02-05 DIAGNOSIS — K5909 Other constipation: Secondary | ICD-10-CM | POA: Diagnosis present

## 2021-02-05 DIAGNOSIS — E1159 Type 2 diabetes mellitus with other circulatory complications: Secondary | ICD-10-CM

## 2021-02-05 DIAGNOSIS — Z87891 Personal history of nicotine dependence: Secondary | ICD-10-CM

## 2021-02-05 DIAGNOSIS — E785 Hyperlipidemia, unspecified: Secondary | ICD-10-CM | POA: Diagnosis present

## 2021-02-05 DIAGNOSIS — J449 Chronic obstructive pulmonary disease, unspecified: Secondary | ICD-10-CM

## 2021-02-05 DIAGNOSIS — I739 Peripheral vascular disease, unspecified: Secondary | ICD-10-CM

## 2021-02-05 DIAGNOSIS — Z7951 Long term (current) use of inhaled steroids: Secondary | ICD-10-CM

## 2021-02-05 DIAGNOSIS — Z7984 Long term (current) use of oral hypoglycemic drugs: Secondary | ICD-10-CM

## 2021-02-05 DIAGNOSIS — R000 Tachycardia, unspecified: Secondary | ICD-10-CM | POA: Diagnosis present

## 2021-02-05 DIAGNOSIS — E1151 Type 2 diabetes mellitus with diabetic peripheral angiopathy without gangrene: Secondary | ICD-10-CM | POA: Diagnosis present

## 2021-02-05 DIAGNOSIS — F4323 Adjustment disorder with mixed anxiety and depressed mood: Secondary | ICD-10-CM | POA: Diagnosis present

## 2021-02-05 LAB — CBC WITH DIFFERENTIAL/PLATELET
Abs Immature Granulocytes: 0.11 10*3/uL — ABNORMAL HIGH (ref 0.00–0.07)
Basophils Absolute: 0.1 10*3/uL (ref 0.0–0.1)
Basophils Relative: 0 %
Eosinophils Absolute: 0.1 10*3/uL (ref 0.0–0.5)
Eosinophils Relative: 0 %
HCT: 47.4 % (ref 39.0–52.0)
Hemoglobin: 15.1 g/dL (ref 13.0–17.0)
Immature Granulocytes: 1 %
Lymphocytes Relative: 6 %
Lymphs Abs: 1 10*3/uL (ref 0.7–4.0)
MCH: 27 pg (ref 26.0–34.0)
MCHC: 31.9 g/dL (ref 30.0–36.0)
MCV: 84.8 fL (ref 80.0–100.0)
Monocytes Absolute: 1.4 10*3/uL — ABNORMAL HIGH (ref 0.1–1.0)
Monocytes Relative: 8 %
Neutro Abs: 15.3 10*3/uL — ABNORMAL HIGH (ref 1.7–7.7)
Neutrophils Relative %: 85 %
Platelets: 344 10*3/uL (ref 150–400)
RBC: 5.59 MIL/uL (ref 4.22–5.81)
RDW: 16.4 % — ABNORMAL HIGH (ref 11.5–15.5)
WBC: 17.9 10*3/uL — ABNORMAL HIGH (ref 4.0–10.5)
nRBC: 0 % (ref 0.0–0.2)

## 2021-02-05 LAB — COMPREHENSIVE METABOLIC PANEL
ALT: 40 U/L (ref 0–44)
AST: 47 U/L — ABNORMAL HIGH (ref 15–41)
Albumin: 3.4 g/dL — ABNORMAL LOW (ref 3.5–5.0)
Alkaline Phosphatase: 100 U/L (ref 38–126)
Anion gap: 10 (ref 5–15)
BUN: 15 mg/dL (ref 8–23)
CO2: 25 mmol/L (ref 22–32)
Calcium: 9.2 mg/dL (ref 8.9–10.3)
Chloride: 99 mmol/L (ref 98–111)
Creatinine, Ser: 0.98 mg/dL (ref 0.61–1.24)
GFR, Estimated: >60 mL/min (ref >60)
Glucose, Bld: 194 mg/dL — ABNORMAL HIGH (ref 70–99)
Potassium: 4.4 mmol/L (ref 3.5–5.1)
Sodium: 134 mmol/L — ABNORMAL LOW (ref 135–145)
Total Bilirubin: 1.2 mg/dL (ref 0.3–1.2)
Total Protein: 8 g/dL (ref 6.5–8.1)

## 2021-02-05 LAB — PROTIME-INR
INR: 1 (ref 0.8–1.2)
Prothrombin Time: 13.5 seconds (ref 11.4–15.2)

## 2021-02-05 LAB — RESP PANEL BY RT-PCR (FLU A&B, COVID) ARPGX2
Influenza A by PCR: NEGATIVE
Influenza B by PCR: NEGATIVE
SARS Coronavirus 2 by RT PCR: NEGATIVE

## 2021-02-05 LAB — MAGNESIUM: Magnesium: 2.1 mg/dL (ref 1.7–2.4)

## 2021-02-05 LAB — CBG MONITORING, ED: Glucose-Capillary: 248 mg/dL — ABNORMAL HIGH (ref 70–99)

## 2021-02-05 LAB — TROPONIN I (HIGH SENSITIVITY): Troponin I (High Sensitivity): 8 ng/L (ref <18)

## 2021-02-05 LAB — LACTIC ACID, PLASMA: Lactic Acid, Venous: 1.6 mmol/L (ref 0.5–1.9)

## 2021-02-05 LAB — PHOSPHORUS: Phosphorus: 2.5 mg/dL (ref 2.5–4.6)

## 2021-02-05 LAB — APTT: aPTT: 30 seconds (ref 24–36)

## 2021-02-05 LAB — BRAIN NATRIURETIC PEPTIDE
B Natriuretic Peptide: 44.4 pg/mL (ref 0.0–100.0)
B Natriuretic Peptide: 59.5 pg/mL (ref 0.0–100.0)

## 2021-02-05 MED ORDER — SODIUM CHLORIDE 0.9 % IV SOLN
2.0000 g | Freq: Three times a day (TID) | INTRAVENOUS | Status: DC
  Administered 2021-02-06 – 2021-02-07 (×4): 2 g via INTRAVENOUS
  Filled 2021-02-05 (×6): qty 2

## 2021-02-05 MED ORDER — IPRATROPIUM-ALBUTEROL 0.5-2.5 (3) MG/3ML IN SOLN
3.0000 mL | Freq: Once | RESPIRATORY_TRACT | Status: AC
  Administered 2021-02-05: 3 mL via RESPIRATORY_TRACT
  Filled 2021-02-05: qty 3

## 2021-02-05 MED ORDER — SODIUM CHLORIDE 0.9 % IV SOLN
2.0000 g | Freq: Once | INTRAVENOUS | Status: AC
  Administered 2021-02-05: 2 g via INTRAVENOUS
  Filled 2021-02-05: qty 2

## 2021-02-05 MED ORDER — METFORMIN HCL ER 750 MG PO TB24
750.0000 mg | ORAL_TABLET | Freq: Two times a day (BID) | ORAL | Status: DC
  Administered 2021-02-06 – 2021-02-09 (×8): 750 mg via ORAL
  Filled 2021-02-05 (×11): qty 1

## 2021-02-05 MED ORDER — MELATONIN 5 MG PO TABS
5.0000 mg | ORAL_TABLET | Freq: Every evening | ORAL | Status: DC | PRN
  Administered 2021-02-07: 5 mg via ORAL
  Filled 2021-02-05 (×2): qty 1

## 2021-02-05 MED ORDER — METOPROLOL TARTRATE 5 MG/5ML IV SOLN: 5.0000 mg | INTRAVENOUS | Status: DC | PRN

## 2021-02-05 MED ORDER — ONDANSETRON HCL 4 MG PO TABS: 4.0000 mg | ORAL_TABLET | Freq: Four times a day (QID) | ORAL | Status: DC | PRN

## 2021-02-05 MED ORDER — IPRATROPIUM-ALBUTEROL 0.5-2.5 (3) MG/3ML IN SOLN
3.0000 mL | Freq: Three times a day (TID) | RESPIRATORY_TRACT | Status: AC
  Administered 2021-02-06 – 2021-02-07 (×6): 3 mL via RESPIRATORY_TRACT
  Filled 2021-02-05 (×6): qty 3

## 2021-02-05 MED ORDER — FAMOTIDINE 20 MG PO TABS
20.0000 mg | ORAL_TABLET | Freq: Every day | ORAL | Status: DC
  Administered 2021-02-06 – 2021-02-09 (×4): 20 mg via ORAL
  Filled 2021-02-05 (×4): qty 1

## 2021-02-05 MED ORDER — METHYLPREDNISOLONE SODIUM SUCC 125 MG IJ SOLR
125.0000 mg | Freq: Once | INTRAMUSCULAR | Status: AC
  Administered 2021-02-05: 125 mg via INTRAVENOUS
  Filled 2021-02-05: qty 2

## 2021-02-05 MED ORDER — ROSUVASTATIN CALCIUM 20 MG PO TABS
40.0000 mg | ORAL_TABLET | Freq: Every day | ORAL | Status: DC
  Administered 2021-02-06 – 2021-02-09 (×4): 40 mg via ORAL
  Filled 2021-02-05 (×4): qty 2

## 2021-02-05 MED ORDER — METOPROLOL TARTRATE 50 MG PO TABS
50.0000 mg | ORAL_TABLET | Freq: Two times a day (BID) | ORAL | Status: DC
  Administered 2021-02-06 – 2021-02-09 (×8): 50 mg via ORAL
  Filled 2021-02-05 (×8): qty 1

## 2021-02-05 MED ORDER — ENOXAPARIN SODIUM 40 MG/0.4ML IJ SOSY
40.0000 mg | PREFILLED_SYRINGE | INTRAMUSCULAR | Status: DC
  Administered 2021-02-06 – 2021-02-09 (×4): 40 mg via SUBCUTANEOUS
  Filled 2021-02-05 (×4): qty 0.4

## 2021-02-05 MED ORDER — INSULIN ASPART 100 UNIT/ML IJ SOLN
0.0000 [IU] | Freq: Every day | INTRAMUSCULAR | Status: DC
  Administered 2021-02-06: 3 [IU] via SUBCUTANEOUS
  Administered 2021-02-06: 2 [IU] via SUBCUTANEOUS
  Filled 2021-02-05 (×2): qty 1

## 2021-02-05 MED ORDER — ASPIRIN EC 81 MG PO TBEC
81.0000 mg | DELAYED_RELEASE_TABLET | Freq: Every evening | ORAL | Status: DC
  Administered 2021-02-06 – 2021-02-08 (×4): 81 mg via ORAL
  Filled 2021-02-05 (×4): qty 1

## 2021-02-05 MED ORDER — IOHEXOL 350 MG/ML SOLN
80.0000 mL | Freq: Once | INTRAVENOUS | Status: AC | PRN
  Administered 2021-02-05: 80 mL via INTRAVENOUS

## 2021-02-05 MED ORDER — ACETAMINOPHEN 650 MG RE SUPP: 650.0000 mg | Freq: Four times a day (QID) | RECTAL | Status: AC | PRN

## 2021-02-05 MED ORDER — ONDANSETRON HCL 4 MG/2ML IJ SOLN: 4.0000 mg | Freq: Four times a day (QID) | INTRAMUSCULAR | Status: DC | PRN

## 2021-02-05 MED ORDER — SODIUM CHLORIDE 0.9 % IV BOLUS (SEPSIS)
1000.0000 mL | Freq: Once | INTRAVENOUS | Status: AC
  Administered 2021-02-05: 1000 mL via INTRAVENOUS

## 2021-02-05 MED ORDER — LACTATED RINGERS IV SOLN: INTRAVENOUS | Status: DC

## 2021-02-05 MED ORDER — SODIUM CHLORIDE 0.9 % IV SOLN
500.0000 mg | INTRAVENOUS | Status: AC
  Administered 2021-02-06 – 2021-02-09 (×4): 500 mg via INTRAVENOUS
  Filled 2021-02-05: qty 5
  Filled 2021-02-05 (×3): qty 500

## 2021-02-05 MED ORDER — ACETAMINOPHEN 325 MG PO TABS
650.0000 mg | ORAL_TABLET | Freq: Four times a day (QID) | ORAL | Status: AC | PRN
  Administered 2021-02-08: 650 mg via ORAL
  Filled 2021-02-05: qty 2

## 2021-02-05 MED ORDER — MOMETASONE FURO-FORMOTEROL FUM 200-5 MCG/ACT IN AERO
2.0000 | INHALATION_SPRAY | Freq: Two times a day (BID) | RESPIRATORY_TRACT | Status: DC
  Administered 2021-02-06 – 2021-02-09 (×8): 2 via RESPIRATORY_TRACT
  Filled 2021-02-05: qty 8.8

## 2021-02-05 MED ORDER — ONDANSETRON HCL 4 MG/2ML IJ SOLN
4.0000 mg | Freq: Once | INTRAMUSCULAR | Status: AC
  Administered 2021-02-05: 4 mg via INTRAVENOUS
  Filled 2021-02-05: qty 2

## 2021-02-05 MED ORDER — INSULIN ASPART 100 UNIT/ML IJ SOLN
0.0000 [IU] | Freq: Three times a day (TID) | INTRAMUSCULAR | Status: DC
  Administered 2021-02-06: 11 [IU] via SUBCUTANEOUS
  Administered 2021-02-06: 15 [IU] via SUBCUTANEOUS
  Administered 2021-02-07 – 2021-02-09 (×5): 3 [IU] via SUBCUTANEOUS
  Filled 2021-02-05 (×7): qty 1

## 2021-02-05 MED ORDER — NYSTATIN 100000 UNIT/ML MT SUSP
5.0000 mL | Freq: Two times a day (BID) | OROMUCOSAL | Status: AC
  Administered 2021-02-06 (×3): 500000 [IU] via ORAL
  Filled 2021-02-05 (×4): qty 5

## 2021-02-05 MED ORDER — POLYETHYLENE GLYCOL 3350 17 G PO PACK: 17.0000 g | PACK | Freq: Every day | ORAL | Status: DC | PRN

## 2021-02-05 MED ORDER — SODIUM CHLORIDE 0.9 % IV SOLN
500.0000 mg | Freq: Once | INTRAVENOUS | Status: AC
  Administered 2021-02-05: 500 mg via INTRAVENOUS
  Filled 2021-02-05: qty 500

## 2021-02-05 MED ORDER — MENTHOL 3 MG MT LOZG
1.0000 | LOZENGE | OROMUCOSAL | Status: DC | PRN
  Filled 2021-02-05: qty 9

## 2021-02-05 MED ORDER — GUAIFENESIN-DM 100-10 MG/5ML PO SYRP: 5.0000 mL | ORAL_SOLUTION | ORAL | Status: DC | PRN

## 2021-02-05 MED ORDER — NITROGLYCERIN 0.4 MG SL SUBL: 0.4000 mg | SUBLINGUAL_TABLET | SUBLINGUAL | Status: DC | PRN

## 2021-02-05 MED ORDER — LORAZEPAM 2 MG/ML IJ SOLN: 2.0000 mg | INTRAMUSCULAR | Status: DC | PRN

## 2021-02-05 MED ORDER — METHYLPREDNISOLONE SODIUM SUCC 40 MG IJ SOLR
40.0000 mg | Freq: Every day | INTRAMUSCULAR | Status: AC
  Administered 2021-02-06: 40 mg via INTRAVENOUS
  Filled 2021-02-05: qty 1

## 2021-02-05 NOTE — ED Provider Notes (Signed)
[facility] Emergency Department Provider Note  ____________________________________________   Event Date/Time   First MD Initiated Contact with Patient 02/05/21 1918     (approximate)  I have reviewed the triage vital signs and the nursing notes.   HISTORY  Chief Complaint Shortness of Breath    HPI Bradley [name] is a 70 y.o. male with COPD normally on 2 L who comes in with shortness of breath.  Reports that he is normally on 2.5 L of oxygen.  He states over the past 2 days he has been coughing more and feeling more short of breath.  Shortness of breath worse with exertion and with speaking, used some of his inhalers with some improvement.  He denies any chest pain.  He did have an episode of vomiting.  He reports maybe a little bit of nausea but denies any abdominal pain at this time.  Denies history of blood clots, unilateral leg swelling, falls and hitting his head.          Past Medical History:  Diagnosis Date   COPD (chronic obstructive pulmonary disease) ([place])    Coronary artery disease    Diabetes mellitus without complication ([place])    Hypertension     Patient Active Problem List   Diagnosis Date Noted   Acute on chronic respiratory failure ([place]) 07/30/2020   Acute on chronic respiratory failure with hypoxia ([place]) 07/29/2020   CKD (chronic kidney disease) stage 3, GFR 30-59 ml/min ([place]) 07/29/2020   Hypertension associated with diabetes ([place]) 07/29/2020   Hyperlipidemia associated with type 2 diabetes mellitus ([place]) 07/29/2020   COVID-19 virus infection 07/29/2020   Callus 07/13/2020   Pain due to onychomycosis of toenails of both feet 01/06/2020   Type 2 diabetes mellitus with vascular disease ([place]) 01/06/2020   Chronic renal failure, stage 2 (mild) 12/01/2019   Lightheadedness 11/16/2019   Acute kidney injury ([place]) 04/20/2019   PAD (peripheral artery disease) ([place]) 04/20/2019   Tachycardia 04/20/2019   B12 deficiency 12/07/2017    OSA (obstructive sleep apnea) 11/28/2017   Lower extremity pain, left 03/06/2017   Numbness and tingling of left leg 03/06/2017   Coronary artery disease involving native coronary artery of native heart without angina pectoris 02/18/2017   Diastasis recti 02/18/2017   CVD (cardiovascular disease) 02/18/2017   Need for vaccination 87/86/7672   Umbilical hernia without obstruction and without gangrene 02/18/2017   Hemoglobinuria 08/21/2016   Anemia 08/07/2016   Pneumonia 07/15/2016   Lung mass 06/28/2016   History of cigarette smoking 06/19/2016   Screening for malignant neoplasm of respiratory organ 06/19/2016   Acute respiratory failure with hypoxia ([place]) 12/27/2015   Community acquired pneumonia 12/27/2015   Acute respiratory distress 09/19/2015   Adjustment disorder with mixed anxiety and depressed mood 04/14/2015   Insomnia 04/14/2015   Grief 04/14/2015   Hyponatremia 04/12/2015   COPD exacerbation ([place]) 10/08/2014   CAP (community acquired pneumonia) 10/08/2014   Hypogammaglobulinemia ([place]) 08/17/2014   Leukocytosis 08/03/2014   Recurrent infections 08/03/2014   Tinnitus of right ear 03/03/2014   Left carotid artery stenosis 07/21/2013   HLD (hyperlipidemia) 10/05/2010   Onychomycosis 10/05/2010    Past Surgical History:  Procedure Laterality Date   CAROTID STENT Left    LEFT HEART CATH AND CORONARY ANGIOGRAPHY Left 11/02/2019   Procedure: LEFT HEART CATH AND CORONARY ANGIOGRAPHY;  Surgeon: Teodoro Spray, MD;  Location: [facility];  Service: Cardiovascular;  Laterality: Left;   LUNG BIOPSY Right  2006   right side partial lobe removed       Prior to Admission medications   Medication Sig Start Date End Date Taking? Authorizing Provider  Albuterol Sulfate (PROAIR RESPICLICK) 048 (90 Base) MCG/ACT AEPB INHALE 1 PUFF BY MOUTH EVERY 4 HOURS AS NEEDED FOR WHEEZING 04/13/20   [provider]  aspirin EC 81 MG tablet Take 81 mg by mouth every evening.      [provider]  dextromethorphan-guaiFENesin (ROBITUSSIN-DM) 10-100 MG/5ML liquid Take by mouth. 11/21/19   [provider]  famotidine (PEPCID) 20 MG tablet  02/17/20   [provider]  Fluticasone-Salmeterol (ADVAIR) 250-50 MCG/DOSE AEPB Inhale 1 puff into the lungs 2 (two) times daily. 09/20/15   Hillary Bow, MD  furosemide (LASIX) 20 MG tablet Take by mouth. 11/21/19   [provider]  glucose blood test strip Use once daily. Use as instructed. DX: E11.9 10/08/16   [provider]  levalbuterol Penne Lash) 0.31 MG/3ML nebulizer solution Inhale into the lungs. 12/22/19 12/21/20  [provider]  metFORMIN (GLUCOPHAGE) 500 MG tablet Take by mouth. 11/21/19 10/10/20  [provider]  metoprolol tartrate (LOPRESSOR) 50 MG tablet Take by mouth. 11/21/19   [provider]  nitroGLYCERIN (NITROSTAT) 0.4 MG SL tablet Place 0.4 mg under the tongue every 5 (five) minutes x 3 doses as needed for chest pain.     [provider]  polyethylene glycol (MIRALAX / GLYCOLAX) 17 g packet Take 17 g by mouth daily.    [provider]  rosuvastatin (CRESTOR) 40 MG tablet Take 40 mg by mouth daily.  08/05/19   [provider]    Allergies Patient has no known allergies.  Family History  Problem Relation Age of Onset   Cancer Mother    Heart attack Father    Cancer Sister     Social History Social History   Tobacco Use   Smoking status: Former    Packs/day: 1.00    Years: 52.00    Pack years: 52.00    Types: Cigarettes    Quit date: 06/18/2016    Years since quitting: 4.6   Smokeless tobacco: Never  Vaping Use   Vaping Use: Never used  Substance Use Topics   Alcohol use: Yes    Alcohol/week: 4.0 standard drinks    Types: 4 Shots of liquor per week    Comment: has rum every other day   Drug use: No      Review of Systems Constitutional: No fever/chills Eyes: No visual changes. ENT: No sore  throat. Cardiovascular: No chest pain Respiratory: Positive for SOB, cough Gastrointestinal: No abdominal pain.  Vomiting no diarrhea.  No constipation. Genitourinary: Negative for dysuria. Musculoskeletal: Negative for back pain. Skin: Negative for rash. Neurological: Negative for headaches, focal weakness or numbness. All other ROS negative ____________________________________________   PHYSICAL EXAM:  VITAL SIGNS: ED Triage Vitals  Enc Vitals Group     BP 02/05/21 1850 (!) 122/49     Pulse Rate 02/05/21 1850 (!) 104     Resp 02/05/21 1850 (!) 32     Temp 02/05/21 1850 99.8 F (37.7 C)     Temp Source 02/05/21 1850 Oral     SpO2 02/05/21 1850 99 %     Weight 02/05/21 1852 160 lb (72.6 kg)     Height 02/05/21 1852 5\' 7"  (1.702 m)     Head Circumference --      Peak Flow --      Pain  Score 02/05/21 1852 0     Pain Loc --      Pain Edu? --      Excl. in [place]? --     Constitutional: Alert and oriented. Well appearing and in no acute distress. Eyes: Conjunctivae are normal. EOMI. Head: Atraumatic. Nose: No congestion/rhinnorhea. Mouth/Throat: Mucous membranes are moist.   Neck: No stridor. Trachea Midline. FROM Cardiovascular: Normal rate, regular rhythm. Grossly normal heart sounds.  Good peripheral circulation. Respiratory: Wheezing bilaterally, on 2.5 L of oxygen Gastrointestinal: Soft and nontender. No distention. No abdominal bruits.  Musculoskeletal: No lower extremity tenderness nor edema.  No joint effusions. Neurologic:  Normal speech and language. No gross focal neurologic deficits are appreciated.  Skin:  Skin is warm, dry and intact. No rash noted. Psychiatric: Mood and affect are normal. Speech and behavior are normal. GU: Deferred   ____________________________________________   LABS (all labs ordered are listed, but only abnormal results are displayed)  Labs Reviewed  COMPREHENSIVE METABOLIC PANEL - Abnormal; Notable for the following components:       Result Value   Sodium 134 (*)    Glucose, Bld 194 (*)    Albumin 3.4 (*)    AST 47 (*)    All other components within normal limits  CBC WITH DIFFERENTIAL/PLATELET - Abnormal; Notable for the following components:   WBC 17.9 (*)    RDW 16.4 (*)    Neutro Abs 15.3 (*)    Monocytes Absolute 1.4 (*)    Abs Immature Granulocytes 0.11 (*)    All other components within normal limits  RESP PANEL BY RT-PCR (FLU A&B, COVID) ARPGX2  CULTURE, BLOOD (ROUTINE X 2)  CULTURE, BLOOD (ROUTINE X 2)  URINE CULTURE  LACTIC ACID, PLASMA  PROTIME-INR  APTT  BRAIN NATRIURETIC PEPTIDE  LACTIC ACID, PLASMA  URINALYSIS, COMPLETE (UACMP) WITH MICROSCOPIC  TROPONIN I (HIGH SENSITIVITY)   ____________________________________________   ED ECG REPORT I, Vanessa [place], the attending physician, personally viewed and interpreted this ECG.  Sinus tachycardia rate of 102, no ST elevation T wave versions, right bundle branch block.  Some artifact noted ____________________________________________  RADIOLOGY Robert Bellow, personally viewed and evaluated these images (plain radiographs) as part of my medical decision making, as well as reviewing the written report by the radiologist.  ED MD interpretation: Concerning for some lower lobe atelectasis versus infiltrate  Official radiology report(s): DG Chest Port 1 View  Result Date: 02/05/2021 CLINICAL DATA:  Questionable sepsis. EXAM: PORTABLE CHEST 1 VIEW COMPARISON:  November 22, 2020 FINDINGS: There is marked severity emphysematous lung disease. Mild areas of atelectasis and/or infiltrate are seen along the periphery of the mid to lower lung fields, left greater than right. There is a small right pleural effusion. No pneumothorax is identified. Radiopaque surgical clips are seen overlying the right hilum. The heart size and mediastinal contours are within normal limits. Marked severity calcification of the aortic arch is noted. The visualized skeletal  structures are unremarkable. IMPRESSION: 1. Marked severity emphysematous lung disease with mild bilateral lower lobe atelectasis and/or infiltrate. 2. Small right pleural effusion. Electronically Signed   By: Virgina Norfolk M.D.   On: 02/05/2021 19:38    ____________________________________________   PROCEDURES  Procedure(s) performed (including Critical Care):  .1-3 Lead EKG Interpretation Performed by: Vanessa [place], MD Authorized by: Vanessa , MD     Interpretation: abnormal     ECG rate:  100s   ECG rate assessment: tachycardic     Rhythm:  sinus tachycardia     Ectopy: none     Conduction: normal   .Critical Care Performed by: Vanessa [place], MD Authorized by: Vanessa , MD   Critical care provider statement:    Critical care time (minutes):  30   Critical care was necessary to treat or prevent imminent or life-threatening deterioration of the following conditions:  Sepsis   Critical care was time spent personally by me on the following activities:  Development of treatment plan with patient or surrogate, discussions with consultants, evaluation of patient's response to treatment, examination of patient, ordering and review of laboratory studies, ordering and review of radiographic studies, ordering and performing treatments and interventions, pulse oximetry, re-evaluation of patient's condition and review of old charts   ____________________________________________   INITIAL IMPRESSION / [place] / ED COURSE   Bradley [name] was evaluated in Emergency Department on 02/05/2021 for the symptoms described in the history of present illness. He was evaluated in the context of the global COVID-19 pandemic, which necessitated consideration that the patient might be at risk for infection with the SARS-CoV-2 virus that causes COVID-19. Institutional protocols and algorithms that pertain to the evaluation of patients at risk for COVID-19 are in a state of rapid  change based on information released by regulatory bodies including the CDC and federal and state organizations. These policies and algorithms were followed during the patient's care in the ED.     Pt presents with SOB.  Patient with tachycardia, low-grade temperatures concerning for the possibility of sepsis.  Blood cultures and lactate were ordered.  We will hold off on antibiotics at this time given no source patient does not look acutely ill.    Differential includes: PNA-will get xray to evaluation Anemia-CBC to evaluate ACS- will get trops Arrhythmia-Will get EKG and keep on monitor.  COVID- will get testing per algorithm. PE-lower suspicion given no risk factors and other cause more likely  Lactate is normal.  Lab work is reassuring other than elevated white count consistent with concern for infectious process.  Chest x-ray concerning for atelectasis versus infiltrate.  Given source patient started on antibiotics for pneumonia  8:09 PM reevaluated patient now he stated that he does have abdominal pain.  When I pushed out of his middle of his abdomen he is reporting pain.  Given he does meet sepsis criteria will get CT scan to evaluate for appendicitis, SBO or other acute pathology as well as a CT of his chest to evaluate for atelectasis versus infiltrate versus PE given the significant shortness of breath  9:24 PM CT imaging infiltrates.  No other surgical abdominal processes.  Does have some nodules and patient was alerted about that we will need some follow-up imaging.  Patient expressed understanding.  At this time will admit to the hospital team for sepsis secondary to pneumonia         ____________________________________________   FINAL CLINICAL IMPRESSION(S) / ED DIAGNOSES   Final diagnoses:  Sepsis, due to unspecified organism, unspecified whether acute organ dysfunction present [facility])  Community acquired pneumonia, unspecified laterality  Chronic obstructive  pulmonary disease, unspecified COPD type ([place])     MEDICATIONS GIVEN DURING THIS VISIT:  Medications  azithromycin (ZITHROMAX) 500 mg in sodium chloride 0.9 % 250 mL IVPB (500 mg Intravenous New Bag/Given 02/05/21 2044)  sodium chloride 0.9 % bolus 1,000 mL (1,000 mLs Intravenous New Bag/Given 02/05/21 1949)  ipratropium-albuterol (DUONEB) 0.5-2.5 (3) MG/3ML nebulizer solution 3 mL (3 mLs Nebulization Given  02/05/21 1937)  ipratropium-albuterol (DUONEB) 0.5-2.5 (3) MG/3ML nebulizer solution 3 mL (3 mLs Nebulization Given 02/05/21 1937)  ipratropium-albuterol (DUONEB) 0.5-2.5 (3) MG/3ML nebulizer solution 3 mL (3 mLs Nebulization Given 02/05/21 1936)  methylPREDNISolone sodium succinate (SOLU-MEDROL) 125 mg/2 mL injection 125 mg (125 mg Intravenous Given 02/05/21 1934)  ondansetron (ZOFRAN) injection 4 mg (4 mg Intravenous Given 02/05/21 2000)  ceFEPIme (MAXIPIME) 2 g in sodium chloride 0.9 % 100 mL IVPB (0 g Intravenous Stopped 02/05/21 2042)  iohexol (OMNIPAQUE) 350 MG/ML injection 80 mL (80 mLs Intravenous Contrast Given 02/05/21 2026)     ED Discharge Orders     None        Note:  This document was prepared using Dragon voice recognition software and may include unintentional dictation errors.   Vanessa [place], MD 02/05/21 2125

## 2021-02-05 NOTE — Consult Note (Signed)
Pharmacy Antibiotic Note  Bradley [name] is a 70 y.o. male admitted on 02/05/2021 with  shortness of breath .  Pharmacy has been consulted for Cefepime dosing.  Plan: Cefepime 2g IV Q8 hours  Height: 5\' 7"  (170.2 cm) Weight: 72.6 kg (160 lb) IBW/kg (Calculated) : 66.1  Temp (24hrs), Avg:99.8 F (37.7 C), Min:99.8 F (37.7 C), Max:99.8 F (37.7 C)  Recent Labs  Lab 02/05/21 1851  WBC 17.9*  CREATININE 0.98  LATICACIDVEN 1.6    Estimated Creatinine Clearance: 65.6 mL/min (by C-G formula based on SCr of 0.98 mg/dL).    No Known Allergies  Antimicrobials this admission: Cefepime 12/5 >>  Azithromycin 12/5 >>    Microbiology results: 12/5 BCx: pending 12/5 UCx: pending  12/5 MRSA PCR: pending  Thank you for allowing pharmacy to be a part of this patient's care.  Lance Coon A Selenia Mihok 02/05/2021 9:50 PM

## 2021-02-05 NOTE — Consult Note (Signed)
PHARMACY -  BRIEF ANTIBIOTIC NOTE   Pharmacy has received consult(s) for Cefepime from an ED provider.  The patient's profile has been reviewed for ht/wt/allergies/indication/available labs.    One time order(s) placed for Cefepime 2g IV x 1 dose.  Further antibiotics/pharmacy consults should be ordered by admitting physician if indicated.                       Thank you, Pearla Dubonnet 02/05/2021  7:59 PM

## 2021-02-05 NOTE — ED Notes (Signed)
Pt is currently on 2.5L and SpO2 is at 94%

## 2021-02-05 NOTE — Progress Notes (Signed)
CODE SEPSIS - PHARMACY COMMUNICATION  **Broad Spectrum Antibiotics should be administered within 1 hour of Sepsis diagnosis**  Time Code Sepsis Called/Page Received: 1911  Antibiotics Ordered: cefepime, azith  Time of 1st antibiotic administration: 2000  Additional action taken by pharmacy: messaged MD at 1947 that no abx have been ordered   If necessary, Name of Provider/Nurse Contacted:      Noralee Space ,PharmD Clinical Pharmacist  02/05/2021  8:03 PM

## 2021-02-05 NOTE — ED Notes (Signed)
Pt taken to CT at this time.

## 2021-02-05 NOTE — ED Triage Notes (Addendum)
Per EMS, pt is coming from home is c/o of SOB. Pt is c/o chest tightness but not pain. Pt states the SOB started about 4 days ago. Ems placed pt on 6L [place]. Pt is also c\o nausea and vomiting  that started about 3 hours ago. Pt states they don't know if they been around anyone with Covid or Flu. Pt does have a hx of COPD and pt normally wears 2L [place] at home.

## 2021-02-05 NOTE — Sepsis Progress Note (Signed)
Following for sepsis monitoring ?

## 2021-02-05 NOTE — H&P (Signed)
History and Physical   Bradley [name] NAT:[id number] DOB: [birth year] DOA: 02/05/2021  PCP: Valera Castle, MD  Outpatient Specialists: Dr. Conni Elliot clinic cardiology Patient coming from: Home via EMS  I have personally briefly reviewed patient's old medical records in [place].  Chief Concern: Shortness of breath  HPI: Bradley [name] is a 70 y.o. male with medical history significant for hypertension, hyperlipidemia, CAD, non-insulin-dependent diabetes mellitus, stage IV COPD, GERD, history of tobacco use, who presents to the emergency department for chief concerns of shortness of breath.  Patient states he is always shortness of breath however since Thursday, 02/01/2021 it has been worse than normal.  This is gradually been worsening since then.  He reports that initially his sputum production was clear white and in the last couple of days it has been yellow.  He endorses associated chest tightness that has been relieved with the treatments provided in the emergency department.  He denies known sick contacts.  He denies fever, diarrhea, dysuria, syncopal episodes, dysphagia, vision changes, headaches.  He does endorse 1 episode of nausea and vomiting that happened today prior to ED presentation.  He states the vomitus was clear and it burned his throat.  Social history: He is a former tobacco user, at his peak he smoked 1.5 packs/day.  He started smoking at age 25 and he quit in April 2017.  He endorses daily EtOH use, drinking 3-4 beers per day.  His last drink of beer was this morning.  He states he has never gone more than 3 days without drinking beer however he denies known delirium tremens and or seizure activity without alcohol.  He denies recreational drug use.  He is currently retired and formerly worked as a Animator.  Vaccination history: He is vaccinated for COVID-19, with 3 total doses of mRNA vaccine.  He has had COVID 3 times.  ROS: Constitutional: no weight  change, no fever ENT/Mouth: no sore throat, no rhinorrhea Eyes: no eye pain, no vision changes Cardiovascular: + chest pain, + dyspnea,  no edema, no palpitations Respiratory: + cough, + sputum, no wheezing Gastrointestinal: + nausea, + vomiting, no diarrhea, no constipation Genitourinary: no urinary incontinence, no dysuria, no hematuria Musculoskeletal: no arthralgias, no myalgias Skin: no skin lesions, no pruritus, Neuro: + weakness, no loss of consciousness, no syncope Psych: no anxiety, no depression, + decrease appetite Heme/Lymph: no bruising, no bleeding  ED Course: Discussed with emergency medicine provider, patient requiring hospitalization for chief concerns of meeting sepsis criteria.  Vitals in the emergency department showed temperature of 99.8, respiration rate initially 32 and improved to 23, initial heart rate of 1064, blood pressure of 122/49, SPO2 of 99% on 2.5 to 3 L nasal cannula.  Serum sodium level was 134, potassium 4.4, chloride 99, bicarb 25, BUN of 15, serum creatinine of 0.98, nonfasting blood glucose 193, GFR greater than 60.  WBC was elevated at 17.9, hemoglobin 15.1, platelets of 344, BNP was 44.4, high sensitive troponin is 8, lactic acid was initially 1.6.  COVID/influenza A/influenza B PCR were negative.  Blood cultures x2 have been collected.  In the emergency department patient received 3 treatments of duo nebs, Solu-Medrol 125 mg IV, cefepime IV, azithromycin, sodium chloride 1 L bolus, and LR at 150 IVF.  Assessment/Plan  Principal Problem:   Sepsis ([place]) Active Problems:   Leukocytosis   CAP (community acquired pneumonia)   Adjustment disorder with mixed anxiety and depressed mood   Insomnia   Community acquired pneumonia  Pneumonia   Type 2 diabetes mellitus with vascular disease (HCC)   HLD (hyperlipidemia)   OSA (obstructive sleep apnea)   PAD (peripheral artery disease) (HCC)   Tachycardia   Hyperlipidemia associated with type 2  diabetes mellitus ([place])   Acute on chronic respiratory failure ([place])   # Patient met sepsis criteria with elevated respiration rate, elevated heart rate, elevated white cell count, with source presumed to be pneumonia # Leukocytosis # Community-acquired pneumonia - Check 20 pathogen respiratory panel - Check MRSA PCR; if positive will add vancomycin - Continue with cefepime and azithromycin IV - Admit to telemetry medical floor observation  # Hypertension-resumed metoprolol tartrate 50 mg p.o. twice daily  # Hyperlipidemia-resumed rosuvastatin 40 mg nightly  # Non-insulin-dependent diabetes mellitus - Resumed metformin 715 mg p.o. twice daily - Insulin SSI with at bedtime coverage for steroid dosing - Goal inpatient blood glucose is 140-180  # GERD-famotidine 20 mg daily resumed  # History of constipation-resumed home GlycoLax as needed for mild constipation  # CAD-aspirin and atorvastatin  # OSA and insomnia-CPAP nightly ordered  # Alcohol use disorder-Ativan 2 mg IV every 2 hours as needed for seizures and anxiety, 3 doses ordered  Chart reviewed.   DVT prophylaxis: Enoxaparin 40 mg subcutaneous every 24 hours Code Status: Full code Diet: Heart healthy/carb modified Family Communication: Updated daughter, [name] at bedside Disposition Plan: Pending clinical course Consults called: None at this time Admission status: Telemetry medical floor  Past Medical History:  Diagnosis Date   COPD (chronic obstructive pulmonary disease) ([place])    Coronary artery disease    Diabetes mellitus without complication ([place])    Hypertension    Past Surgical History:  Procedure Laterality Date   CAROTID STENT Left    LEFT HEART CATH AND CORONARY ANGIOGRAPHY Left 11/02/2019   Procedure: LEFT HEART CATH AND CORONARY ANGIOGRAPHY;  Surgeon: Teodoro Spray, MD;  Location: [facility];  Service: Cardiovascular;  Laterality: Left;   LUNG BIOPSY Right    2006   right side partial  lobe removed      Social History:  reports that he quit smoking about 4 years ago. His smoking use included cigarettes. He has a 52.00 pack-year smoking history. He has never used smokeless tobacco. He reports current alcohol use of about 4.0 standard drinks per week. He reports that he does not use drugs.  No Known Allergies Family History  Problem Relation Age of Onset   Cancer Mother    Heart attack Father    Cancer Sister    Family history: Family history reviewed and not pertinent  Prior to Admission medications   Medication Sig Start Date End Date Taking? Authorizing Provider  Albuterol Sulfate (PROAIR RESPICLICK) 191 (90 Base) MCG/ACT AEPB INHALE 1 PUFF BY MOUTH EVERY 4 HOURS AS NEEDED FOR WHEEZING 04/13/20  Yes [provider]  aspirin EC 81 MG tablet Take 81 mg by mouth every evening.    Yes [provider]  dextromethorphan-guaiFENesin (ROBITUSSIN-DM) 10-100 MG/5ML liquid Take 5 mLs by mouth every 4 (four) hours as needed. 11/21/19  Yes [provider]  famotidine (PEPCID) 20 MG tablet Take 20 mg by mouth daily. 02/17/20  Yes [provider]  Fluticasone-Salmeterol (ADVAIR) 250-50 MCG/DOSE AEPB Inhale 1 puff into the lungs 2 (two) times daily. 09/20/15  Yes Sudini, Alveta Heimlich, MD  furosemide (LASIX) 20 MG tablet Take 20 mg by mouth daily. 11/21/19  Yes [provider]  glucose blood test strip Use once daily.  Use as instructed. DX: E11.9 10/08/16  Yes [provider]  levalbuterol (XOPENEX) 0.31 MG/3ML nebulizer solution Take 1 ampule by nebulization every 6 (six) hours as needed. 12/22/19 02/05/21 Yes [provider]  metFORMIN (GLUCOPHAGE-XR) 750 MG 24 hr tablet Take 750 mg by mouth 2 (two) times daily. 12/05/20  Yes [provider]  metoprolol tartrate (LOPRESSOR) 50 MG tablet Take 50 mg by mouth 2 (two) times daily. 11/21/19  Yes [provider]  montelukast (SINGULAIR) 10 MG tablet Take 10 mg by mouth at  bedtime. 01/09/21  Yes [provider]  polyethylene glycol (MIRALAX / GLYCOLAX) 17 g packet Take 17 g by mouth daily.   Yes [provider]  rosuvastatin (CRESTOR) 40 MG tablet Take 40 mg by mouth daily.  08/05/19  Yes [provider]  nitroGLYCERIN (NITROSTAT) 0.4 MG SL tablet Place 0.4 mg under the tongue every 5 (five) minutes x 3 doses as needed for chest pain.     [provider]   Physical Exam: Vitals:   02/05/21 1850 02/05/21 1852 02/05/21 1930 02/05/21 2045  BP: (!) 122/49  (!) 117/55 127/60  Pulse: (!) 104  (!) 106 (!) 115  Resp: (!) 32  20 20  Temp: 99.8 F (37.7 C)     TempSrc: Oral     SpO2: 99%  93%   Weight:  72.6 kg    Height:  5' 7" (1.702 m)     Constitutional: appears age-appropriate, frail, NAD, calm, comfortable Eyes: PERRL, lids and conjunctivae normal ENMT: Mucous membranes are moist. Posterior pharynx clear of any exudate or lesions. Age-appropriate dentition. Hearing appropriate Neck: normal, supple, no masses, no thyromegaly Respiratory: clear to auscultation bilaterally, no wheezing, no crackles. Normal respiratory effort. No accessory muscle use.  Cardiovascular: Regular rate and rhythm, no murmurs / rubs / gallops. No extremity edema. 2+ pedal pulses. No carotid bruits.  Abdomen: no tenderness, no masses palpated, no hepatosplenomegaly. Bowel sounds positive.  Musculoskeletal: no clubbing / cyanosis. No joint deformity upper and lower extremities. Good ROM, no contractures, no atrophy. Normal muscle tone.  Skin: no rashes, lesions, ulcers. No induration Neurologic: Sensation intact. Strength 5/5 in all 4.  Psychiatric: Normal judgment and insight. Alert and oriented x 3. Normal mood.   EKG: independently reviewed, showing sinus tachycardia with rate of 102, QTc 520  Chest x-ray on Admission: I personally reviewed and I agree with radiologist reading as below.  CT Angio Chest PE W and/or Wo Contrast  Result Date:  02/05/2021 CLINICAL DATA:  Shortness of breath and abdominal distension, initial encounter EXAM: CT ANGIOGRAPHY CHEST CT ABDOMEN AND PELVIS WITH CONTRAST TECHNIQUE: Multidetector CT imaging of the chest was performed using the standard protocol during bolus administration of intravenous contrast. Multiplanar CT image reconstructions and MIPs were obtained to evaluate the vascular anatomy. Multidetector CT imaging of the abdomen and pelvis was performed using the standard protocol during bolus administration of intravenous contrast. CONTRAST:  74m OMNIPAQUE IOHEXOL 350 MG/ML SOLN COMPARISON:  Chest x-ray from earlier in the same day, CT from 12/06/2020, 08/22/2017. FINDINGS: CTA CHEST FINDINGS Cardiovascular: Atherosclerotic calcifications of the thoracic aorta are seen. No aneurysmal dilatation or dissection is noted. No cardiac enlargement is noted. Diffuse coronary calcifications are seen. Pulmonary artery is well visualized with a normal branching pattern. No filling defect to suggest pulmonary embolism is identified. Mediastinum/Nodes: Thoracic inlet is within normal limits. No sizable hilar or mediastinal adenopathy is noted. The esophagus as visualized is within normal limits. Lungs/Pleura: Emphysematous  changes of lungs are seen. Focal infiltrate is noted in the lingula laterally similar to that seen on recent plain film examination. Stable nodular density is noted in the left upper lobe on image number 31 of series 7 unchanged from the prior study. No new left-sided nodule is noted. Mild superimposed infiltrate is noted in the right lung base similar to that seen on prior plain film examination. Chronic scarring is noted in the medial right lung consistent with prior partial lobectomy. Stable scarring is noted in the right upper lobe. Nodular changes seen in the right upper lobe best seen on image number 54 of series 7. This measures approximately 14 mm in greatest dimension and appears stable from the  prior study. Stable nodule in the right lung base is noted on image number 71 similar to that seen on the prior study. The nodular change posteriorly is again seen on image number 83 of series 7 measuring up to 11 mm slightly more prominent than that seen on the prior exam. Calcified pleural plaques are noted on the right. Musculoskeletal: Degenerative changes of the thoracic spine are noted. No acute rib abnormality is seen. No compression deformity is noted. Review of the MIP images confirms the above findings. CT ABDOMEN and PELVIS FINDINGS Hepatobiliary: No focal liver abnormality is seen. No gallstones, gallbladder wall thickening, or biliary dilatation. Pancreas: Unremarkable. No pancreatic ductal dilatation or surrounding inflammatory changes. Spleen: Calcified granulomas are noted. Adrenals/Urinary Tract: Adrenal glands are within normal limits. Kidneys demonstrate a normal enhancement pattern. Left renal cysts are seen stable from prior exam. Ureters show no obstructive changes. Bladder is partially distended. Stomach/Bowel: Mild diverticular change of the colon is noted. No obstructive changes are seen. The appendix is within normal limits. Small bowel and stomach are unremarkable. Vascular/Lymphatic: Atherosclerotic calcifications of the abdominal aorta are noted. No aneurysmal dilatation is seen. No lymphadenopathy is noted. Reproductive: Prostate is unremarkable. Other: No abdominal wall hernia or abnormality. No abdominopelvic ascites. Musculoskeletal: Degenerative change of the lumbar spine is seen. Review of the MIP images confirms the above findings. IMPRESSION: CTA of the chest: No evidence of pulmonary emboli. Multiple nodular densities are identified bilaterally. The nodule in the right lower lobe best seen on image number 83 of series 7 is slightly more prominent than that seen on the prior exam which may be related to the imaging technique. This can be followed on subsequent exams. Patchy right  basilar and left upper lobe infiltrates superimposed over severe emphysematous changes. Changes consistent with prior partial resection in the right lower lobe. CT of the abdomen and pelvis: Changes of prior granulomatous disease. Diverticulosis without diverticulitis. No acute abnormality noted. Electronically Signed   By: Inez Catalina M.D.   On: 02/05/2021 20:59   CT ABDOMEN PELVIS W CONTRAST  Result Date: 02/05/2021 CLINICAL DATA:  Shortness of breath and abdominal distension, initial encounter EXAM: CT ANGIOGRAPHY CHEST CT ABDOMEN AND PELVIS WITH CONTRAST TECHNIQUE: Multidetector CT imaging of the chest was performed using the standard protocol during bolus administration of intravenous contrast. Multiplanar CT image reconstructions and MIPs were obtained to evaluate the vascular anatomy. Multidetector CT imaging of the abdomen and pelvis was performed using the standard protocol during bolus administration of intravenous contrast. CONTRAST:  56m OMNIPAQUE IOHEXOL 350 MG/ML SOLN COMPARISON:  Chest x-ray from earlier in the same day, CT from 12/06/2020, 08/22/2017. FINDINGS: CTA CHEST FINDINGS Cardiovascular: Atherosclerotic calcifications of the thoracic aorta are seen. No aneurysmal dilatation or dissection is noted. No cardiac enlargement is  noted. Diffuse coronary calcifications are seen. Pulmonary artery is well visualized with a normal branching pattern. No filling defect to suggest pulmonary embolism is identified. Mediastinum/Nodes: Thoracic inlet is within normal limits. No sizable hilar or mediastinal adenopathy is noted. The esophagus as visualized is within normal limits. Lungs/Pleura: Emphysematous changes of lungs are seen. Focal infiltrate is noted in the lingula laterally similar to that seen on recent plain film examination. Stable nodular density is noted in the left upper lobe on image number 31 of series 7 unchanged from the prior study. No new left-sided nodule is noted. Mild  superimposed infiltrate is noted in the right lung base similar to that seen on prior plain film examination. Chronic scarring is noted in the medial right lung consistent with prior partial lobectomy. Stable scarring is noted in the right upper lobe. Nodular changes seen in the right upper lobe best seen on image number 54 of series 7. This measures approximately 14 mm in greatest dimension and appears stable from the prior study. Stable nodule in the right lung base is noted on image number 71 similar to that seen on the prior study. The nodular change posteriorly is again seen on image number 83 of series 7 measuring up to 11 mm slightly more prominent than that seen on the prior exam. Calcified pleural plaques are noted on the right. Musculoskeletal: Degenerative changes of the thoracic spine are noted. No acute rib abnormality is seen. No compression deformity is noted. Review of the MIP images confirms the above findings. CT ABDOMEN and PELVIS FINDINGS Hepatobiliary: No focal liver abnormality is seen. No gallstones, gallbladder wall thickening, or biliary dilatation. Pancreas: Unremarkable. No pancreatic ductal dilatation or surrounding inflammatory changes. Spleen: Calcified granulomas are noted. Adrenals/Urinary Tract: Adrenal glands are within normal limits. Kidneys demonstrate a normal enhancement pattern. Left renal cysts are seen stable from prior exam. Ureters show no obstructive changes. Bladder is partially distended. Stomach/Bowel: Mild diverticular change of the colon is noted. No obstructive changes are seen. The appendix is within normal limits. Small bowel and stomach are unremarkable. Vascular/Lymphatic: Atherosclerotic calcifications of the abdominal aorta are noted. No aneurysmal dilatation is seen. No lymphadenopathy is noted. Reproductive: Prostate is unremarkable. Other: No abdominal wall hernia or abnormality. No abdominopelvic ascites. Musculoskeletal: Degenerative change of the lumbar  spine is seen. Review of the MIP images confirms the above findings. IMPRESSION: CTA of the chest: No evidence of pulmonary emboli. Multiple nodular densities are identified bilaterally. The nodule in the right lower lobe best seen on image number 83 of series 7 is slightly more prominent than that seen on the prior exam which may be related to the imaging technique. This can be followed on subsequent exams. Patchy right basilar and left upper lobe infiltrates superimposed over severe emphysematous changes. Changes consistent with prior partial resection in the right lower lobe. CT of the abdomen and pelvis: Changes of prior granulomatous disease. Diverticulosis without diverticulitis. No acute abnormality noted. Electronically Signed   By: Inez Catalina M.D.   On: 02/05/2021 20:59   DG Chest Port 1 View  Result Date: 02/05/2021 CLINICAL DATA:  Questionable sepsis. EXAM: PORTABLE CHEST 1 VIEW COMPARISON:  November 22, 2020 FINDINGS: There is marked severity emphysematous lung disease. Mild areas of atelectasis and/or infiltrate are seen along the periphery of the mid to lower lung fields, left greater than right. There is a small right pleural effusion. No pneumothorax is identified. Radiopaque surgical clips are seen overlying the right hilum. The heart size and  mediastinal contours are within normal limits. Marked severity calcification of the aortic arch is noted. The visualized skeletal structures are unremarkable. IMPRESSION: 1. Marked severity emphysematous lung disease with mild bilateral lower lobe atelectasis and/or infiltrate. 2. Small right pleural effusion. Electronically Signed   By: Virgina Norfolk M.D.   On: 02/05/2021 19:38    Labs on Admission: I have personally reviewed following labs  CBC: Recent Labs  Lab 02/05/21 1851  WBC 17.9*  NEUTROABS 15.3*  HGB 15.1  HCT 47.4  MCV 84.8  PLT 967   Basic Metabolic Panel: Recent Labs  Lab 02/05/21 1851  NA 134*  K 4.4  CL 99  CO2  25  GLUCOSE 194*  BUN 15  CREATININE 0.98  CALCIUM 9.2   GFR: Estimated Creatinine Clearance: 65.6 mL/min (by C-G formula based on SCr of 0.98 mg/dL).  Liver Function Tests: Recent Labs  Lab 02/05/21 1851  AST 47*  ALT 40  ALKPHOS 100  BILITOT 1.2  PROT 8.0  ALBUMIN 3.4*   Coagulation Profile: Recent Labs  Lab 02/05/21 1851  INR 1.0   Urine analysis:    Component Value Date/Time   COLORURINE YELLOW (A) 08/22/2017 1337   APPEARANCEUR CLEAR (A) 08/22/2017 1337   LABSPEC 1.013 08/22/2017 1337   PHURINE 5.0 08/22/2017 1337   GLUCOSEU NEGATIVE 08/22/2017 1337   HGBUR SMALL (A) 08/22/2017 1337   BILIRUBINUR NEGATIVE 08/22/2017 1337   KETONESUR 20 (A) 08/22/2017 1337   PROTEINUR NEGATIVE 08/22/2017 1337   NITRITE NEGATIVE 08/22/2017 1337   LEUKOCYTESUR NEGATIVE 08/22/2017 1337   Dr. Tobie Poet [facility]  If 7PM-7AM, please contact overnight-coverage provider If 7AM-7PM, please contact day coverage provider [email or web address]  02/05/2021, 9:37 PM

## 2021-02-06 ENCOUNTER — Encounter: Payer: Self-pay | Admitting: Internal Medicine

## 2021-02-06 DIAGNOSIS — Z8249 Family history of ischemic heart disease and other diseases of the circulatory system: Secondary | ICD-10-CM | POA: Diagnosis not present

## 2021-02-06 DIAGNOSIS — E1151 Type 2 diabetes mellitus with diabetic peripheral angiopathy without gangrene: Secondary | ICD-10-CM | POA: Diagnosis present

## 2021-02-06 DIAGNOSIS — I2510 Atherosclerotic heart disease of native coronary artery without angina pectoris: Secondary | ICD-10-CM | POA: Diagnosis present

## 2021-02-06 DIAGNOSIS — Z20822 Contact with and (suspected) exposure to covid-19: Secondary | ICD-10-CM | POA: Diagnosis present

## 2021-02-06 DIAGNOSIS — K5909 Other constipation: Secondary | ICD-10-CM | POA: Diagnosis present

## 2021-02-06 DIAGNOSIS — B9789 Other viral agents as the cause of diseases classified elsewhere: Secondary | ICD-10-CM | POA: Diagnosis present

## 2021-02-06 DIAGNOSIS — G4733 Obstructive sleep apnea (adult) (pediatric): Secondary | ICD-10-CM | POA: Diagnosis present

## 2021-02-06 DIAGNOSIS — Z87891 Personal history of nicotine dependence: Secondary | ICD-10-CM | POA: Diagnosis not present

## 2021-02-06 DIAGNOSIS — E1169 Type 2 diabetes mellitus with other specified complication: Secondary | ICD-10-CM | POA: Diagnosis present

## 2021-02-06 DIAGNOSIS — J189 Pneumonia, unspecified organism: Secondary | ICD-10-CM | POA: Diagnosis present

## 2021-02-06 DIAGNOSIS — E1165 Type 2 diabetes mellitus with hyperglycemia: Secondary | ICD-10-CM | POA: Diagnosis present

## 2021-02-06 DIAGNOSIS — F4323 Adjustment disorder with mixed anxiety and depressed mood: Secondary | ICD-10-CM | POA: Diagnosis present

## 2021-02-06 DIAGNOSIS — A419 Sepsis, unspecified organism: Secondary | ICD-10-CM | POA: Diagnosis present

## 2021-02-06 DIAGNOSIS — J441 Chronic obstructive pulmonary disease with (acute) exacerbation: Secondary | ICD-10-CM | POA: Diagnosis present

## 2021-02-06 DIAGNOSIS — I10 Essential (primary) hypertension: Secondary | ICD-10-CM | POA: Diagnosis present

## 2021-02-06 DIAGNOSIS — K219 Gastro-esophageal reflux disease without esophagitis: Secondary | ICD-10-CM | POA: Diagnosis present

## 2021-02-06 DIAGNOSIS — F1020 Alcohol dependence, uncomplicated: Secondary | ICD-10-CM | POA: Diagnosis present

## 2021-02-06 DIAGNOSIS — J440 Chronic obstructive pulmonary disease with acute lower respiratory infection: Secondary | ICD-10-CM | POA: Diagnosis present

## 2021-02-06 DIAGNOSIS — D649 Anemia, unspecified: Secondary | ICD-10-CM | POA: Diagnosis present

## 2021-02-06 DIAGNOSIS — E785 Hyperlipidemia, unspecified: Secondary | ICD-10-CM | POA: Diagnosis present

## 2021-02-06 DIAGNOSIS — J1289 Other viral pneumonia: Secondary | ICD-10-CM | POA: Diagnosis not present

## 2021-02-06 DIAGNOSIS — J9621 Acute and chronic respiratory failure with hypoxia: Secondary | ICD-10-CM | POA: Diagnosis present

## 2021-02-06 DIAGNOSIS — Z7951 Long term (current) use of inhaled steroids: Secondary | ICD-10-CM | POA: Diagnosis not present

## 2021-02-06 DIAGNOSIS — G4700 Insomnia, unspecified: Secondary | ICD-10-CM | POA: Diagnosis present

## 2021-02-06 LAB — BASIC METABOLIC PANEL
Anion gap: 8 (ref 5–15)
BUN: 16 mg/dL (ref 8–23)
CO2: 23 mmol/L (ref 22–32)
Calcium: 8.8 mg/dL — ABNORMAL LOW (ref 8.9–10.3)
Chloride: 102 mmol/L (ref 98–111)
Creatinine, Ser: 0.95 mg/dL (ref 0.61–1.24)
GFR, Estimated: >60 mL/min (ref >60)
Glucose, Bld: 343 mg/dL — ABNORMAL HIGH (ref 70–99)
Potassium: 4.1 mmol/L (ref 3.5–5.1)
Sodium: 133 mmol/L — ABNORMAL LOW (ref 135–145)

## 2021-02-06 LAB — URINALYSIS, COMPLETE (UACMP) WITH MICROSCOPIC
Bacteria, UA: NONE SEEN
Bilirubin Urine: NEGATIVE
Glucose, UA: >=500 mg/dL — AB
Ketones, ur: 20 mg/dL — AB
Leukocytes,Ua: NEGATIVE
Nitrite: NEGATIVE
Protein, ur: 100 mg/dL — AB
Specific Gravity, Urine: 1.043 — ABNORMAL HIGH (ref 1.005–1.030)
Squamous Epithelial / HPF: NONE SEEN (ref 0–5)
pH: 5 (ref 5.0–8.0)

## 2021-02-06 LAB — GLUCOSE, CAPILLARY: Glucose-Capillary: 266 mg/dL — ABNORMAL HIGH (ref 70–99)

## 2021-02-06 LAB — RESPIRATORY PANEL BY PCR

## 2021-02-06 LAB — CBC
HCT: 40.6 % (ref 39.0–52.0)
Hemoglobin: 13.1 g/dL (ref 13.0–17.0)
MCH: 27.1 pg (ref 26.0–34.0)
MCHC: 32.3 g/dL (ref 30.0–36.0)
MCV: 84.1 fL (ref 80.0–100.0)
Platelets: 301 10*3/uL (ref 150–400)
RBC: 4.83 MIL/uL (ref 4.22–5.81)
RDW: 16.3 % — ABNORMAL HIGH (ref 11.5–15.5)
WBC: 12.2 10*3/uL — ABNORMAL HIGH (ref 4.0–10.5)
nRBC: 0 % (ref 0.0–0.2)

## 2021-02-06 LAB — STREP PNEUMONIAE URINARY ANTIGEN: Strep Pneumo Urinary Antigen: NEGATIVE

## 2021-02-06 LAB — CBG MONITORING, ED
Glucose-Capillary: 325 mg/dL — ABNORMAL HIGH (ref 70–99)
Glucose-Capillary: 256 mg/dL — ABNORMAL HIGH (ref 70–99)
Glucose-Capillary: 99 mg/dL (ref 70–99)

## 2021-02-06 LAB — HEMOGLOBIN A1C
Hgb A1c MFr Bld: 8.3 % — ABNORMAL HIGH (ref 4.8–5.6)
Mean Plasma Glucose: 192 mg/dL

## 2021-02-06 LAB — PROCALCITONIN: Procalcitonin: 0.36 ng/mL

## 2021-02-06 LAB — LACTIC ACID, PLASMA: Lactic Acid, Venous: 1.4 mmol/L (ref 0.5–1.9)

## 2021-02-06 LAB — MRSA NEXT GEN BY PCR, NASAL: MRSA by PCR Next Gen: NOT DETECTED

## 2021-02-06 MED ORDER — SODIUM CHLORIDE 0.9 % IV SOLN: INTRAVENOUS | Status: DC | PRN

## 2021-02-06 NOTE — ED Notes (Signed)
Lab in room at this time.

## 2021-02-06 NOTE — Progress Notes (Signed)
Inpatient Diabetes Program Recommendations  AACE/ADA: New Consensus Statement on Inpatient Glycemic Control   Target Ranges:  Prepandial:   less than 140 mg/dL      Peak postprandial:   less than 180 mg/dL (1-2 hours)      Critically ill patients:  140 - 180 mg/dL    Latest Reference Range & Units 02/05/21 22:49 02/06/21 08:26  Glucose-Capillary 70 - 99 mg/dL 248 (H) 325 (H)   Review of Glycemic Control  Diabetes history: DM2 Outpatient Diabetes medications: Metformin XR 750 mg BID (Tradjenta 5 mg daily noted on last PCP visit on 10/23/20 but not on home med list) Current orders for Inpatient glycemic control: Novolog 0-20 units TID with meals, Novolog 0-5 units QHS, Metformin XR 750 mg BID; Solumedrol 40 mg daily  Inpatient Diabetes Program Recommendations:    Insulin: If steroids are continued as ordered, please consider ordering Semglee 7 units Q24H.  Thanks, Barnie Alderman, RN, MSN, CDE Diabetes Coordinator Inpatient Diabetes Program [phone] (Team Pager from 8am to 5pm)

## 2021-02-06 NOTE — ED Notes (Signed)
Patient is resting comfortably. Lunch tray delivered. Denies further needs at this time.

## 2021-02-06 NOTE — ED Notes (Signed)
Pt. Alert and oriented, warm and dry, in no distress.  Pt. Encouraged to let nursing staff know of any concerns or needs.

## 2021-02-06 NOTE — ED Notes (Signed)
Refused to placed in a gown. Ate breakfast without problems.

## 2021-02-06 NOTE — ED Notes (Signed)
Pt resting with eyes closed, respirations even and unlabored.

## 2021-02-06 NOTE — ED Notes (Signed)
Pt. Alert and oriented, warm and dry, in no distress. Encouraged to let nursing staff know of any concerns or needs.

## 2021-02-06 NOTE — Progress Notes (Signed)
PROGRESS NOTE  Bradley [name]  DOB: [birth year]  PCP: Bradley Castle, MD EXH:[id number]  DOA: 02/05/2021  LOS: 0 days  Hospital Day: 2  Chief Complaint  Patient presents with   Shortness of Breath   Brief narrative: Bradley [name] is a 70 y.o. male with PMH significant for DM2, HTN, HLD, CAD, history of smoking, severe COPD, GERD, chronic alcohol use about 3-4 beers per day.. Patient presented to the ED on 12/5 with complaint of progressive worsening of shortness of breath for 5 days associated with yellowish phlegm, chest tightness.  In the ED, patient was afebrile, heart rate 104, respiratory 33, blood pressure 122/49, 99% oxygen on 3 L nasal cannula. While in the ED, he had an episode of vomiting as well. Labs with WC count 17.1, hemoglobin 15.1, lactic acid 1.6 Respiratory virus panel positive for rhinovirus. CT angio of chest did not show any evidence of pulm embolism.  But it showed multifocal infiltrates superimposed over severe emphysematous changes In the ED, patient was started on Solu-Medrol, IV antibiotics, DuoNeb, IV fluid and admitted to hospitalist service.  Subjective: Patient was seen and examined this morning.  Pleasant elderly Caucasian male.  Propped up in bed.  Not in distress.  Feels better than at presentation. Chart reviewed Hemodynamically stable, heart rate improving, on 2.5 L oxygen this morning  Assessment/Plan: Sepsis due to multifocal pneumonia -Presented with 5 days of progressively worsening chronic shortness of breath in the setting of severe COPD -Met sepsis criteria with tachycardia, tachypnea, leukocytosis and multifocal infiltrates in CT chest. -Continue IV antibiotics for now. -Obtain and trend procalcitonin level. Recent Labs  Lab 02/05/21 1851 02/06/21 0625  WBC 17.9* 12.2*  LATICACIDVEN 1.6 1.4  PROCALCITON  --  0.36   Rhinovirus infection -Respiratory-positive for rhinovirus.  Supportive care  Acute on chronic respiratory  failure with hypoxia -Baseline oxygen requirement 2 to 3 L  Acute exacerbation of COPD -Home meds include Advair, ProAir, Singulair.  Not on chronic prednisone. -currently on IV Solu-Medrol 40 mg daily with bronchodilators.  Type 2 diabetes mellitus with hyperglycemia -A1c 6.9 in 2018. -Home meds include metformin 750 mg twice daily. -Blood sugar level is running over 300 currently probably because of steroids. -Currently on sliding scale insulin with Accu-Cheks.  I will give him 10 units of Lantus this morning -Continue to monitor blood sugar level. Recent Labs  Lab 02/05/21 2249 02/06/21 0826 02/06/21 1209  GLUCAP 248* 325* 256*   Essential hypertension -Home meds include metoprolol 50 mg daily, Lasix 20 mg daily  History of CAD/HLD -Continue aspirin, statin  GERD -Continue famotidine 20 mg daily   History of constipation -Continue GlycoLax as needed for mild constipation  OSA and insomnia -Continue nightly CPAP.    Chronic alcoholism -Reports using 3-4 beers daily.  CIWA protocol.   Mobility: Encourage ambulation. Living condition: Lives at home Goals of care:   Code Status: Full Code  Nutritional status: Body mass index is 25.06 kg/m.      Diet:  Diet Order             Diet heart healthy/carb modified Room service appropriate? Yes; Fluid consistency: Thin  Diet effective now                  DVT prophylaxis:  enoxaparin (LOVENOX) injection 40 mg Start: 02/06/21 0800 Place TED hose Start: 02/05/21 2131   Antimicrobials: IV cefepime and azithromycin Fluid: None Consultants: None Family Communication: None at bedside  Status is: Observation  Continue in-hospital care because: Needs IV antibiotics, blood culture follow-up, clinical monitoring Level of care: Telemetry Medical   Dispo: The patient is from: Home              Anticipated d/c is to: Home in 2-3 days              Patient currently is not medically stable to d/c.   Difficult to  place patient No     Infusions:   azithromycin Stopped (02/06/21 1125)   ceFEPime (MAXIPIME) IV Stopped (02/06/21 1216)    Scheduled Meds:  aspirin EC  81 mg Oral QPM   enoxaparin (LOVENOX) injection  40 mg Subcutaneous Q24H   famotidine  20 mg Oral Daily   insulin aspart  0-20 Units Subcutaneous TID WC   insulin aspart  0-5 Units Subcutaneous QHS   ipratropium-albuterol  3 mL Nebulization TID   metFORMIN  750 mg Oral BID   methylPREDNISolone (SOLU-MEDROL) injection  40 mg Intravenous Q1200   metoprolol tartrate  50 mg Oral BID   mometasone-formoterol  2 puff Inhalation BID   nystatin  5 mL Oral BID   rosuvastatin  40 mg Oral Daily    PRN meds: acetaminophen **OR** acetaminophen, guaiFENesin-dextromethorphan, LORazepam, melatonin, menthol-cetylpyridinium, metoprolol tartrate, nitroGLYCERIN, ondansetron **OR** ondansetron (ZOFRAN) IV, polyethylene glycol   Antimicrobials: Anti-infectives (From admission, onward)    Start     Dose/Rate Route Frequency Ordered Stop   02/06/21 1000  azithromycin (ZITHROMAX) 500 mg in sodium chloride 0.9 % 250 mL IVPB        500 mg 250 mL/hr over 60 Minutes Intravenous Every 24 hours 02/05/21 2141 02/10/21 0959   02/06/21 0600  ceFEPIme (MAXIPIME) 2 g in sodium chloride 0.9 % 100 mL IVPB        2 g 200 mL/hr over 30 Minutes Intravenous Every 8 hours 02/05/21 2150     02/05/21 2000  ceFEPIme (MAXIPIME) 2 g in sodium chloride 0.9 % 100 mL IVPB        2 g 200 mL/hr over 30 Minutes Intravenous  Once 02/05/21 1948 02/05/21 2042   02/05/21 2000  azithromycin (ZITHROMAX) 500 mg in sodium chloride 0.9 % 250 mL IVPB        500 mg 250 mL/hr over 60 Minutes Intravenous  Once 02/05/21 1948 02/05/21 2152       Objective: Vitals:   02/06/21 0930 02/06/21 1200  BP: (!) 108/55 (!) 95/52  Pulse: 90 91  Resp: (!) 24 (!) 25  Temp:    SpO2: 93% 97%    Intake/Output Summary (Last 24 hours) at 02/06/2021 1255 Last data filed at 02/06/2021 1213 Gross  per 24 hour  Intake --  Output 475 ml  Net -475 ml   Filed Weights   02/05/21 1852  Weight: 72.6 kg   Weight change:  Body mass index is 25.06 kg/m.   Physical Exam: General exam: Pleasant, elderly Caucasian male.  Not in distress Skin: No rashes, lesions or ulcers. HEENT: Atraumatic, normocephalic, no obvious bleeding Lungs: Diminished air entry in both bases.  Mild scattered wheezing CVS: Regular rate and rhythm, no murmur GI/Abd soft, nontender, nondistended, bowel sound present CNS: Alert, awake, oriented x3 Psychiatry: More appropriate Extremities: No pedal edema, no calf tenderness  Data Review: I have personally reviewed the laboratory data and studies available.  F/u labs ordered Unresulted Labs (From admission, onward)     Start     Ordered   02/07/21 0500  Procalcitonin  Daily,  STAT      02/06/21 0926   02/07/21 0500  CBC with Differential/Platelet  Daily,   STAT      02/06/21 0926   02/07/21 8548  Basic metabolic panel  Daily,   STAT      02/06/21 0926   02/06/21 0500  Hemoglobin A1c  Once,   STAT        02/06/21 0500   02/05/21 1910  Urine Culture  (Septic presentation on arrival (screening labs, nursing and treatment orders for obvious sepsis))  ONCE - STAT,   STAT       Question:  Indication  Answer:  Sepsis   02/05/21 1909            Signed, Terrilee Croak, MD [facility] 02/06/2021

## 2021-02-06 NOTE — ED Notes (Signed)
Pt alert and oriented, requesting snack.

## 2021-02-07 DIAGNOSIS — A419 Sepsis, unspecified organism: Secondary | ICD-10-CM | POA: Diagnosis not present

## 2021-02-07 LAB — CBC WITH DIFFERENTIAL/PLATELET
Abs Immature Granulocytes: 0.1 10*3/uL — ABNORMAL HIGH (ref 0.00–0.07)
Basophils Absolute: 0 10*3/uL (ref 0.0–0.1)
Basophils Relative: 0 %
Eosinophils Absolute: 0 10*3/uL (ref 0.0–0.5)
Eosinophils Relative: 0 %
HCT: 38.6 % — ABNORMAL LOW (ref 39.0–52.0)
Hemoglobin: 12.4 g/dL — ABNORMAL LOW (ref 13.0–17.0)
Immature Granulocytes: 1 %
Lymphocytes Relative: 13 %
Lymphs Abs: 1.9 10*3/uL (ref 0.7–4.0)
MCH: 27 pg (ref 26.0–34.0)
MCHC: 32.1 g/dL (ref 30.0–36.0)
MCV: 83.9 fL (ref 80.0–100.0)
Monocytes Absolute: 1.2 10*3/uL — ABNORMAL HIGH (ref 0.1–1.0)
Monocytes Relative: 8 %
Neutro Abs: 12.2 10*3/uL — ABNORMAL HIGH (ref 1.7–7.7)
Neutrophils Relative %: 78 %
Platelets: 327 10*3/uL (ref 150–400)
RBC: 4.6 MIL/uL (ref 4.22–5.81)
RDW: 16.3 % — ABNORMAL HIGH (ref 11.5–15.5)
WBC: 15.5 10*3/uL — ABNORMAL HIGH (ref 4.0–10.5)
nRBC: 0 % (ref 0.0–0.2)

## 2021-02-07 LAB — BASIC METABOLIC PANEL
Anion gap: 7 (ref 5–15)
BUN: 23 mg/dL (ref 8–23)
CO2: 25 mmol/L (ref 22–32)
Calcium: 8.7 mg/dL — ABNORMAL LOW (ref 8.9–10.3)
Chloride: 104 mmol/L (ref 98–111)
Creatinine, Ser: 1.12 mg/dL (ref 0.61–1.24)
GFR, Estimated: >60 mL/min (ref >60)
Glucose, Bld: 247 mg/dL — ABNORMAL HIGH (ref 70–99)
Potassium: 3.8 mmol/L (ref 3.5–5.1)
Sodium: 136 mmol/L (ref 135–145)

## 2021-02-07 LAB — GLUCOSE, CAPILLARY
Glucose-Capillary: 121 mg/dL — ABNORMAL HIGH (ref 70–99)
Glucose-Capillary: 121 mg/dL — ABNORMAL HIGH (ref 70–99)
Glucose-Capillary: 134 mg/dL — ABNORMAL HIGH (ref 70–99)
Glucose-Capillary: 199 mg/dL — ABNORMAL HIGH (ref 70–99)

## 2021-02-07 LAB — URINE CULTURE: Culture: NO GROWTH

## 2021-02-07 LAB — PROCALCITONIN: Procalcitonin: 0.47 ng/mL

## 2021-02-07 MED ORDER — GLIPIZIDE ER 5 MG PO TB24
5.0000 mg | ORAL_TABLET | Freq: Every day | ORAL | Status: DC
  Filled 2021-02-07: qty 1

## 2021-02-07 MED ORDER — INSULIN GLARGINE-YFGN 100 UNIT/ML ~~LOC~~ SOLN
5.0000 [IU] | Freq: Every day | SUBCUTANEOUS | Status: DC
  Administered 2021-02-07 – 2021-02-09 (×3): 5 [IU] via SUBCUTANEOUS
  Filled 2021-02-07 (×4): qty 0.05

## 2021-02-07 MED ORDER — GLIPIZIDE ER 2.5 MG PO TB24: 2.5000 mg | ORAL_TABLET | Freq: Every day | ORAL | Status: DC

## 2021-02-07 MED ORDER — CEFEPIME HCL 2 G IJ SOLR
2.0000 g | Freq: Two times a day (BID) | INTRAMUSCULAR | Status: DC
  Administered 2021-02-07 – 2021-02-08 (×2): 2 g via INTRAVENOUS
  Filled 2021-02-07 (×3): qty 2

## 2021-02-07 MED ORDER — GLIPIZIDE ER 2.5 MG PO TB24
2.5000 mg | ORAL_TABLET | Freq: Every day | ORAL | Status: DC
  Administered 2021-02-07 – 2021-02-09 (×3): 2.5 mg via ORAL
  Filled 2021-02-07 (×4): qty 1

## 2021-02-07 NOTE — Consult Note (Signed)
Pharmacy Antibiotic Note  Bradley [name] is a 71 y.o. male admitted on 02/05/2021 with  shortness of breath .  Pharmacy has been consulted for Cefepime dosing.  Plan: Decrease cefepime to 2 g IV q12h per indication and renal function Monitor clinical picture and renal function F/U C&S, abx deescalation / LOT    Height: 5\' 7"  (170.2 cm) Weight: 65.7 kg (144 lb 14.4 oz) IBW/kg (Calculated) : 66.1  Temp (24hrs), Avg:97.8 F (36.6 C), Min:97.6 F (36.4 C), Max:98 F (36.7 C)  Recent Labs  Lab 02/05/21 1851 02/06/21 0625 02/07/21 0410  WBC 17.9* 12.2* 15.5*  CREATININE 0.98 0.95 1.12  LATICACIDVEN 1.6 1.4  --      Estimated Creatinine Clearance: 57 mL/min (by C-G formula based on SCr of 1.12 mg/dL).    No Known Allergies  Antimicrobials this admission: Cefepime 12/5 >>  Azithromycin 12/5 >>    Microbiology results: 12/5 BCx: NGTD 12/5 UCx: NG 12/5 MRSA PCR: negative  12/5 resp panel: Rhinovirus +   Thank you for allowing pharmacy to be a part of this patient's care.  Darnelle Bos, PharmD 02/07/2021 1:44 PM

## 2021-02-07 NOTE — TOC Initial Note (Signed)
Transition of Care [facility]) - Initial/Assessment Note    Patient Details  Name: Bradley [name] MRN: [id number] Date of Birth: 1950-07-23  Transition of Care [facility] CM/SW Contact:    Kerin Salen, RN Phone Number: 02/07/2021, 3:15 PM  Clinical Narrative:  TOCRN spoke with patient who indicates that he lives in a [place], Son lives with him. Patient able to drive to PCP Dr. Tedra Coupe at [facility] in [place]. Uses the [facility] in [place] and unable to get Spiriva and Advair due to cost, has nebulizer and continuous Oxygen 2.5NC. Do not use assistive device however voices being unsteady and may need device. Will check on inhaler coupons and ask for PT to evaluate for balance. TOC to continue to track.                  Expected Discharge Plan: Home/Self Care Barriers to Discharge: Continued Medical Work up   Patient Goals and CMS Choice Patient states their goals for this hospitalization and ongoing recovery are:: To return home, Son lives with him.   Choice offered to / list presented to : NA  Expected Discharge Plan and Services Expected Discharge Plan: Home/Self Care       Living arrangements for the past 2 months: Mobile Home                                      Prior Living Arrangements/Services Living arrangements for the past 2 months: Mobile Home Lives with:: Adult Children Patient language and need for interpreter reviewed:: Yes Do you feel safe going back to the place where you live?: Yes      Need for Family Participation in Patient Care: No (Comment) Care giver support system in place?: Yes (comment)   Criminal Activity/Legal Involvement Pertinent to Current Situation/Hospitalization: No - Comment as needed  Activities of Daily Living Home Assistive Devices/Equipment: Oxygen, Grab bars around toilet, Grab bars in shower, CBG Meter ADL Screening (condition at time of admission) Patient's cognitive ability adequate to safely complete daily activities?:  Yes Is the patient deaf or have difficulty hearing?: No Does the patient have difficulty seeing, even when wearing glasses/contacts?: No Does the patient have difficulty concentrating, remembering, or making decisions?: No Patient able to express need for assistance with ADLs?: Yes Does the patient have difficulty dressing or bathing?: No Independently performs ADLs?: Yes (appropriate for developmental age) Does the patient have difficulty walking or climbing stairs?: No Weakness of Legs: None Weakness of Arms/Hands: None  Permission Sought/Granted Permission sought to share information with : Case Manager                Emotional Assessment Appearance:: Appears stated age Attitude/Demeanor/Rapport: Engaged Affect (typically observed): Accepting Orientation: : Oriented to Self, Oriented to Place, Oriented to  Time, Oriented to Situation Alcohol / Substance Use: Not Applicable Psych Involvement: No (comment)  Admission diagnosis:  Community acquired pneumonia [J18.9] CAP (community acquired pneumonia) [J18.9] Chronic obstructive pulmonary disease, unspecified COPD type ([place]) [J44.9] Community acquired pneumonia, unspecified laterality [J18.9] Sepsis, due to unspecified organism, unspecified whether acute organ dysfunction present [facility]) [A41.9] Patient Active Problem List   Diagnosis Date Noted   Sepsis ([place]) 02/05/2021   History of tobacco use disorder 02/05/2021   Alcohol use disorder, moderate, dependence ([place]) 02/05/2021   Acute on chronic respiratory failure ([place]) 07/30/2020   Acute on chronic respiratory failure with hypoxia ([place]) 07/29/2020  CKD (chronic kidney disease) stage 3, GFR 30-59 ml/min (HCC) 07/29/2020   Hypertension associated with diabetes ([place]) 07/29/2020   Hyperlipidemia associated with type 2 diabetes mellitus ([place]) 07/29/2020   COVID-19 virus infection 07/29/2020   Callus 07/13/2020   Pain due to onychomycosis of toenails of both feet 01/06/2020    Type 2 diabetes mellitus with vascular disease ([place]) 01/06/2020   Chronic renal failure, stage 2 (mild) 12/01/2019   Lightheadedness 11/16/2019   Acute kidney injury ([place]) 04/20/2019   PAD (peripheral artery disease) ([place]) 04/20/2019   Tachycardia 04/20/2019   B12 deficiency 12/07/2017   OSA (obstructive sleep apnea) 11/28/2017   Lower extremity pain, left 03/06/2017   Numbness and tingling of left leg 03/06/2017   Coronary artery disease involving native coronary artery of native heart without angina pectoris 02/18/2017   Diastasis recti 02/18/2017   CVD (cardiovascular disease) 02/18/2017   Need for vaccination 70/03/7492   Umbilical hernia without obstruction and without gangrene 02/18/2017   Hemoglobinuria 08/21/2016   Anemia 08/07/2016   Pneumonia 07/15/2016   Lung mass 06/28/2016   History of cigarette smoking 06/19/2016   Screening for malignant neoplasm of respiratory organ 06/19/2016   Acute respiratory failure with hypoxia ([place]) 12/27/2015   Community acquired pneumonia 12/27/2015   Acute respiratory distress 09/19/2015   Adjustment disorder with mixed anxiety and depressed mood 04/14/2015   Insomnia 04/14/2015   Grief 04/14/2015   Hyponatremia 04/12/2015   COPD exacerbation ([place]) 10/08/2014   CAP (community acquired pneumonia) 10/08/2014   Hypogammaglobulinemia ([place]) 08/17/2014   Leukocytosis 08/03/2014   Recurrent infections 08/03/2014   Tinnitus of right ear 03/03/2014   Left carotid artery stenosis 07/21/2013   HLD (hyperlipidemia) 10/05/2010   Onychomycosis 10/05/2010   PCP:  Valera Castle, MD Pharmacy:   [facility] [address] [place] [address] Phone: [phone] Fax: [phone]     Social Determinants of Health (SDOH) Interventions    Readmission Risk Interventions Readmission Risk Prevention Plan 02/07/2021 07/31/2020  Transportation Screening Complete Complete  PCP or Specialist  Appt within 3-5 Days Complete Complete  HRI or Home Care Consult Complete Complete  Social Work Consult for [place] Planning/Counseling Complete Not Complete  SW consult not completed comments - RNCM assigned to patient  Palliative Care Screening Not Applicable Not Applicable  Medication Review (RN Care Manager) Complete Complete  Some recent data might be hidden

## 2021-02-07 NOTE — Progress Notes (Signed)
PROGRESS NOTE  Bradley [name]  DOB: Dec 25, 1950  PCP: Valera Castle, MD JXB:[id number]  DOA: 02/05/2021  LOS: 1 day  Hospital Day: 3  Chief Complaint  Patient presents with   Shortness of Breath   Brief narrative: Bradley [name] is a 70 y.o. male with PMH significant for DM2, HTN, HLD, CAD, history of smoking, severe COPD, GERD, chronic alcohol use about 3-4 beers per day.. Patient presented to the ED on 12/5 with complaint of progressive worsening of shortness of breath for 5 days associated with yellowish phlegm, chest tightness.  In the ED, patient was afebrile, heart rate 104, respiratory 33, blood pressure 122/49, 99% oxygen on 3 L nasal cannula. While in the ED, he had an episode of vomiting as well. Labs with WC count 17.1, hemoglobin 15.1, lactic acid 1.6 Respiratory virus panel positive for rhinovirus. CT angio of chest did not show any evidence of pulm embolism.  But it showed multifocal infiltrates superimposed over severe emphysematous changes In the ED, patient was started on Solu-Medrol, IV antibiotics, DuoNeb, IV fluid and admitted to hospitalist service.  Subjective: Patient was seen and examined this morning.  Propped up in bed.  Not in distress.  No new symptoms.  On 2 L oxygen nasal cannula.  Feels better than at presentation.  Able to take deep breathing today.    Assessment/Plan: Sepsis due to multifocal pneumonia -Presented with 5 days of progressively worsening chronic shortness of breath in the setting of severe COPD -Met sepsis criteria with tachycardia, tachypnea, leukocytosis and multifocal infiltrates in CT chest. -Not improving on IV antibiotics.  WBC count better.  Procalcitonin level however is trending up.  Continue to monitor. Recent Labs  Lab 02/05/21 1851 02/06/21 0625 02/07/21 0410  WBC 17.9* 12.2* 15.5*  LATICACIDVEN 1.6 1.4  --   PROCALCITON  --  0.36 0.47   Rhinovirus infection -Respiratory-positive for rhinovirus.  Supportive  care  Acute on chronic respiratory failure with hypoxia -Baseline oxygen requirement 2 to 3 L.  Initially required higher flow.  Currently at baseline requirement.  Acute exacerbation of COPD -Home meds include Advair, ProAir, Singulair.  Not on chronic prednisone. -currently on IV Solu-Medrol 40 mg daily with bronchodilators.  Continue same for today.  Plan to switch to oral prednisone tomorrow.  Uncontrolled type 2 diabetes mellitus with hyperglycemia -A1c 8.3 on 12/6 -Home meds include metformin 750 mg twice daily. -Blood sugar level is running further elevated probably because of steroids. -Currently on Lantus 10 units daily along with sliding scale insulin with Accu-Cheks. -I will add glipizide to his regimen today. Recent Labs  Lab 02/06/21 0826 02/06/21 1209 02/06/21 1607 02/06/21 2105 02/07/21 0814  GLUCAP 325* 256* 99 266* 121*   Essential hypertension -Home meds include metoprolol 50 mg daily, Lasix 20 mg daily  History of CAD/HLD -Continue aspirin, statin  GERD -Continue famotidine 20 mg daily   History of constipation -Continue GlycoLax as needed for mild constipation  OSA and insomnia -Continue nightly CPAP.    Chronic alcoholism -Reports using 3-4 beers daily.  CIWA protocol.   Mobility: Encourage ambulation. Living condition: Lives at home Goals of care:   Code Status: Full Code  Nutritional status: Body mass index is 22.69 kg/m.      Diet:  Diet Order             Diet heart healthy/carb modified Room service appropriate? Yes; Fluid consistency: Thin  Diet effective now  DVT prophylaxis:  enoxaparin (LOVENOX) injection 40 mg Start: 02/06/21 0800 Place TED hose Start: 02/05/21 2131   Antimicrobials: IV cefepime and azithromycin Fluid: None Consultants: None Family Communication: None at bedside  Status is: Observation  Continue in-hospital care because: Clinically improving on IV antibiotics.   Level of care:  Telemetry Medical   Dispo: The patient is from: Home              Anticipated d/c is to: Home hopefully tomorrow, pending PT              Patient currently is not medically stable to d/c.   Difficult to place patient No     Infusions:   sodium chloride 10 mL/hr at 02/07/21 6060   azithromycin 500 mg (02/07/21 0905)   ceFEPime (MAXIPIME) IV      Scheduled Meds:  aspirin EC  81 mg Oral QPM   enoxaparin (LOVENOX) injection  40 mg Subcutaneous Q24H   famotidine  20 mg Oral Daily   glipiZIDE  2.5 mg Oral Q breakfast   insulin aspart  0-20 Units Subcutaneous TID WC   insulin aspart  0-5 Units Subcutaneous QHS   insulin glargine-yfgn  5 Units Subcutaneous Daily   ipratropium-albuterol  3 mL Nebulization TID   metFORMIN  750 mg Oral BID   metoprolol tartrate  50 mg Oral BID   mometasone-formoterol  2 puff Inhalation BID   rosuvastatin  40 mg Oral Daily    PRN meds: sodium chloride, acetaminophen **OR** acetaminophen, guaiFENesin-dextromethorphan, LORazepam, melatonin, menthol-cetylpyridinium, metoprolol tartrate, nitroGLYCERIN, ondansetron **OR** ondansetron (ZOFRAN) IV, polyethylene glycol   Antimicrobials: Anti-infectives (From admission, onward)    Start     Dose/Rate Route Frequency Ordered Stop   02/07/21 1841  ceFEPIme (MAXIPIME) 2 g in sodium chloride 0.9 % 100 mL IVPB        2 g 200 mL/hr over 30 Minutes Intravenous Every 12 hours 02/07/21 0830     02/06/21 1000  azithromycin (ZITHROMAX) 500 mg in sodium chloride 0.9 % 250 mL IVPB        500 mg 250 mL/hr over 60 Minutes Intravenous Every 24 hours 02/05/21 2141 02/10/21 0959   02/06/21 0600  ceFEPIme (MAXIPIME) 2 g in sodium chloride 0.9 % 100 mL IVPB  Status:  Discontinued        2 g 200 mL/hr over 30 Minutes Intravenous Every 8 hours 02/05/21 2150 02/07/21 0830   02/05/21 2000  ceFEPIme (MAXIPIME) 2 g in sodium chloride 0.9 % 100 mL IVPB        2 g 200 mL/hr over 30 Minutes Intravenous  Once 02/05/21 1948 02/05/21  2042   02/05/21 2000  azithromycin (ZITHROMAX) 500 mg in sodium chloride 0.9 % 250 mL IVPB        500 mg 250 mL/hr over 60 Minutes Intravenous  Once 02/05/21 1948 02/05/21 2152       Objective: Vitals:   02/07/21 0751 02/07/21 0812  BP:  115/66  Pulse:  73  Resp:  18  Temp:  97.8 F (36.6 C)  SpO2: 96% 96%    Intake/Output Summary (Last 24 hours) at 02/07/2021 1158 Last data filed at 02/07/2021 0459 Gross per 24 hour  Intake 304.83 ml  Output 1450 ml  Net -1145.17 ml   Filed Weights   02/05/21 1852 02/06/21 2011  Weight: 72.6 kg 65.7 kg   Weight change: -6.849 kg Body mass index is 22.69 kg/m.   Physical Exam: General exam: Pleasant, elderly Caucasian male.  Not in physical distress Skin: No rashes, lesions or ulcers. HEENT: Atraumatic, normocephalic, no obvious bleeding Lungs: Continues to have mild scattered wheezing. CVS: Regular rate and rhythm, no murmur GI/Abd soft, nontender, nondistended, bowel sound present CNS: Alert, awake, oriented x3 Psychiatry: More appropriate Extremities: No pedal edema, no calf tenderness  Data Review: I have personally reviewed the laboratory data and studies available.  F/u labs ordered Unresulted Labs (From admission, onward)     Start     Ordered   02/07/21 0500  Procalcitonin  Daily,   STAT      02/06/21 0926   02/07/21 0500  CBC with Differential/Platelet  Daily,   STAT      02/06/21 0926   02/07/21 8957  Basic metabolic panel  Daily,   STAT      02/06/21 0926            Signed, Terrilee Croak, MD [facility] 02/07/2021

## 2021-02-07 NOTE — Evaluation (Addendum)
Physical Therapy Evaluation Patient Details Name: Bradley [name] MRN: [id number] DOB: 05-15-1950 Today's Date: 02/07/2021  History of Present Illness  Patient is a 70 yo male that presented to ED for SOB. PMH of DM2, HLD, CAD, history of smoking, severe COPD, GERD, etoh. Workup positive for rhinovirus, sepsis due to PNA.   Clinical Impression  Patient A&Ox4 denied pain. Pt stated his breathing is a little bit better today compared to admission. At baseline he reported his is independent with ADLs, needs assistance with in/out of truck transfers, as well as a hand to get up and down the stairs in his home. No falls int he last 6 months, lives with his grandson that provides assistance and IADLs as needed.  The patient was able to perform bed mobility modI. On 2.5L. Transferred to recliner with supervision without AD, definite use of hands. spO2 mid 80s, bumped up to 4L (pt reported at baseline he uses 4-5L for mobility). Several bouts of ambulation performed, required 6L to maintain spO2 >88%, and RW utilized to maximize pt independence and confidence.  Overall the patient demonstrated deficits (see "PT Problem List") that impede the patient's functional abilities, safety, and mobility and would benefit from skilled PT intervention. Recommendation this time is HHPT with intermittent assistance/supervision.        Recommendations for follow up therapy are one component of a multi-disciplinary discharge planning process, led by the attending physician.  Recommendations may be updated based on patient status, additional functional criteria and insurance authorization.  Follow Up Recommendations Home health PT    Assistance Recommended at Discharge Intermittent Supervision/Assistance  Functional Status Assessment Patient has had a recent decline in their functional status and demonstrates the ability to make significant improvements in function in a reasonable and predictable amount of time.  Equipment  Recommendations  Rolling walker (2 wheels)    Recommendations for Other Services       Precautions / Restrictions Precautions Precautions: Fall Restrictions Weight Bearing Restrictions: No      Mobility  Bed Mobility Overal bed mobility: Modified Independent                  Transfers Overall transfer level: Modified independent Equipment used: Rolling walker (2 wheels)               General transfer comment: cued for hand placement due to it being new task    Ambulation/Gait   Gait Distance (Feet):  (1ft, 88ft, 51ft)           General Gait Details: on 6L for ambulation (~73ft) to door and back. no LOB noted  Stairs            Wheelchair Mobility    Modified Rankin (Stroke Patients Only)       Balance Overall balance assessment: Mild deficits observed, not formally tested                                           Pertinent Vitals/Pain Pain Assessment: No/denies pain    Home Living Family/patient expects to be discharged to:: Private residence Living Arrangements: Children Available Help at Discharge: Family Type of Home: House Home Access: Stairs to enter Entrance Stairs-Rails: Left Entrance Stairs-Number of Steps: 3   Home Layout: One level Home Equipment: Wheelchair - manual Additional Comments: 2.5 L chronic home O2 at rest. turns O2 up to 4-5L for mobility  Prior Function               Mobility Comments: modI/I for mobility at baseline, except for assist needed to get in/out of house and in/out of truck       Hand Dominance        Extremity/Trunk Assessment   Upper Extremity Assessment Upper Extremity Assessment: Overall WFL for tasks assessed    Lower Extremity Assessment Lower Extremity Assessment: Overall WFL for tasks assessed    Cervical / Trunk Assessment Cervical / Trunk Assessment: Kyphotic  Communication   Communication: No difficulties  Cognition Arousal/Alertness:  Awake/alert Behavior During Therapy: WFL for tasks assessed/performed Overall Cognitive Status: Within Functional Limits for tasks assessed                                          General Comments      Exercises     Assessment/Plan    PT Assessment Patient needs continued PT services  PT Problem List Decreased strength;Decreased mobility;Cardiopulmonary status limiting activity;Decreased balance;Decreased activity tolerance       PT Treatment Interventions DME instruction;Balance training;Gait training;Neuromuscular re-education;Stair training;Functional mobility training;Patient/family education;Therapeutic activities;Therapeutic exercise    PT Goals (Current goals can be found in the Care Plan section)  Acute Rehab PT Goals Patient Stated Goal: to get his strength back PT Goal Formulation: With patient Time For Goal Achievement: 02/21/21 Potential to Achieve Goals: Good    Frequency Min 2X/week   Barriers to discharge        Co-evaluation               AM-PAC PT "6 Clicks" Mobility  Outcome Measure Help needed turning from your back to your side while in a flat bed without using bedrails?: None Help needed moving from lying on your back to sitting on the side of a flat bed without using bedrails?: None Help needed moving to and from a bed to a chair (including a wheelchair)?: None Help needed standing up from a chair using your arms (e.g., wheelchair or bedside chair)?: None Help needed to walk in hospital room?: None Help needed climbing 3-5 steps with a railing? : A Little 6 Click Score: 23    End of Session Equipment Utilized During Treatment: Gait belt;Oxygen (2.5 - 6 L) Activity Tolerance: Patient tolerated treatment well Patient left: in chair;with call bell/phone within reach;with chair alarm set Nurse Communication: Mobility status PT Visit Diagnosis: Difficulty in walking, not elsewhere classified (R26.2);Muscle weakness  (generalized) (M62.81)    Time: 7353-2992 PT Time Calculation (min) (ACUTE ONLY): 32 min   Charges:   PT Evaluation $PT Eval Low Complexity: 1 Low PT Treatments $Therapeutic Activity: 23-37 mins      Lieutenant Diego PT, DPT 3:50 PM,02/07/21

## 2021-02-08 DIAGNOSIS — A419 Sepsis, unspecified organism: Secondary | ICD-10-CM | POA: Diagnosis not present

## 2021-02-08 LAB — CBC WITH DIFFERENTIAL/PLATELET
Abs Immature Granulocytes: 0.1 10*3/uL — ABNORMAL HIGH (ref 0.00–0.07)
Basophils Absolute: 0 10*3/uL (ref 0.0–0.1)
Basophils Relative: 0 %
Eosinophils Absolute: 0.3 10*3/uL (ref 0.0–0.5)
Eosinophils Relative: 3 %
HCT: 39.4 % (ref 39.0–52.0)
Hemoglobin: 12.7 g/dL — ABNORMAL LOW (ref 13.0–17.0)
Immature Granulocytes: 1 %
Lymphocytes Relative: 19 %
Lymphs Abs: 2.2 10*3/uL (ref 0.7–4.0)
MCH: 26.9 pg (ref 26.0–34.0)
MCHC: 32.2 g/dL (ref 30.0–36.0)
MCV: 83.5 fL (ref 80.0–100.0)
Monocytes Absolute: 1.1 10*3/uL — ABNORMAL HIGH (ref 0.1–1.0)
Monocytes Relative: 10 %
Neutro Abs: 7.5 10*3/uL (ref 1.7–7.7)
Neutrophils Relative %: 67 %
Platelets: 349 10*3/uL (ref 150–400)
RBC: 4.72 MIL/uL (ref 4.22–5.81)
RDW: 16.3 % — ABNORMAL HIGH (ref 11.5–15.5)
WBC: 11.2 10*3/uL — ABNORMAL HIGH (ref 4.0–10.5)
nRBC: 0 % (ref 0.0–0.2)

## 2021-02-08 LAB — GLUCOSE, CAPILLARY
Glucose-Capillary: 145 mg/dL — ABNORMAL HIGH (ref 70–99)
Glucose-Capillary: 97 mg/dL (ref 70–99)
Glucose-Capillary: 99 mg/dL (ref 70–99)
Glucose-Capillary: 96 mg/dL (ref 70–99)

## 2021-02-08 LAB — PROCALCITONIN: Procalcitonin: 0.25 ng/mL

## 2021-02-08 LAB — BASIC METABOLIC PANEL
Anion gap: 9 (ref 5–15)
BUN: 21 mg/dL (ref 8–23)
CO2: 24 mmol/L (ref 22–32)
Calcium: 8.7 mg/dL — ABNORMAL LOW (ref 8.9–10.3)
Chloride: 104 mmol/L (ref 98–111)
Creatinine, Ser: 0.8 mg/dL (ref 0.61–1.24)
GFR, Estimated: >60 mL/min (ref >60)
Glucose, Bld: 155 mg/dL — ABNORMAL HIGH (ref 70–99)
Potassium: 3.5 mmol/L (ref 3.5–5.1)
Sodium: 137 mmol/L (ref 135–145)

## 2021-02-08 MED ORDER — SODIUM CHLORIDE 0.9 % IV SOLN
2.0000 g | INTRAVENOUS | Status: DC
  Administered 2021-02-08: 2 g via INTRAVENOUS
  Filled 2021-02-08: qty 20
  Filled 2021-02-08: qty 2

## 2021-02-08 MED ORDER — SODIUM CHLORIDE 0.9 % IV SOLN
2.0000 g | Freq: Three times a day (TID) | INTRAVENOUS | Status: DC
  Filled 2021-02-08 (×2): qty 2

## 2021-02-08 MED ORDER — GUAIFENESIN ER 600 MG PO TB12
1200.0000 mg | ORAL_TABLET | Freq: Two times a day (BID) | ORAL | Status: DC
  Administered 2021-02-08 – 2021-02-09 (×2): 1200 mg via ORAL
  Filled 2021-02-08 (×3): qty 2

## 2021-02-08 MED ORDER — FLUTICASONE PROPIONATE 50 MCG/ACT NA SUSP
2.0000 | Freq: Every day | NASAL | Status: DC
  Administered 2021-02-08 – 2021-02-09 (×2): 2 via NASAL
  Filled 2021-02-08: qty 16

## 2021-02-08 MED ORDER — IPRATROPIUM-ALBUTEROL 0.5-2.5 (3) MG/3ML IN SOLN: 3.0000 mL | Freq: Four times a day (QID) | RESPIRATORY_TRACT | Status: DC | PRN

## 2021-02-08 NOTE — Progress Notes (Signed)
PROGRESS NOTE  Bradley [name]  DOB: 02/13/1951  PCP: Valera Castle, MD VZS:[id number]  DOA: 02/05/2021  LOS: 2 days  Hospital Day: 4  Chief Complaint  Patient presents with   Shortness of Breath   Brief narrative: Bradley [name] is a 70 y.o. male with PMH significant for DM2, HTN, HLD, CAD, history of smoking, severe COPD, GERD, chronic alcohol use about 3-4 beers per day.. Patient presented to the ED on 12/5 with complaint of progressive worsening of shortness of breath for 5 days associated with yellowish phlegm, chest tightness.  In the ED, patient was afebrile, heart rate 104, respiratory 33, blood pressure 122/49, 99% oxygen on 3 L nasal cannula. While in the ED, he had an episode of vomiting as well. Labs with WC count 17.1, hemoglobin 15.1, lactic acid 1.6 Respiratory virus panel positive for rhinovirus. CT angio of chest did not show any evidence of pulm embolism.  But it showed multifocal infiltrates superimposed over severe emphysematous changes In the ED, patient was started on Solu-Medrol, IV antibiotics, DuoNeb, IV fluid and admitted to hospitalist service.  Subjective: Patient was seen and examined this morning.  Sitting up in chair.  Not in distress.  On 2 L documented exam.  He is not symptomatic at rest but on minimal ambulation, starts getting short of breath, tachycardic.  Assessment/Plan: Sepsis due to multifocal pneumonia -Presented with 5 days of progressively worsening chronic shortness of breath in the setting of severe COPD -Met sepsis criteria with tachycardia, tachypnea, leukocytosis and multifocal infiltrates in CT chest. -Currently clinically improving with antibiotics.  WC count improving, procalcitonin level improving.  At rest his symptoms are better but on ambulation, he continues to have significant limitation. -Monitor on IV antibiotics for next 24 hours. Recent Labs  Lab 02/05/21 1851 02/06/21 0625 02/07/21 0410 02/08/21 0408  WBC 17.9*  12.2* 15.5* 11.2*  LATICACIDVEN 1.6 1.4  --   --   PROCALCITON  --  0.36 0.47 0.25   Rhinovirus infection -Respiratory-positive for rhinovirus.  Supportive care  Acute on chronic respiratory failure with hypoxia -Baseline oxygen requirement 2 to 3 L.  Initially required higher flow.  Currently at baseline requirement at rest, desatted to 80s on ambulation yesterday and required higher flow.  Acute exacerbation of COPD -Home meds include Advair, ProAir, Singulair.  Not on chronic prednisone. -currently on IV Solu-Medrol 40 mg daily.  Continue IV steroids for now.  Continue bronchodilators.  Uncontrolled type 2 diabetes mellitus with hyperglycemia -A1c 8.3 on 12/6 -Home meds include metformin 750 mg twice daily. -Blood sugar level is running further elevated probably because of steroids. -Currently on Lantus 10 units daily and glipizide.  Continue sliding scale insulin with Accu-Cheks. Recent Labs  Lab 02/07/21 1218 02/07/21 1741 02/07/21 2030 02/08/21 0809 02/08/21 1227  GLUCAP 121* 134* 199* 145* 97   Essential hypertension -Home meds include metoprolol 50 mg daily, Lasix 20 mg daily  History of CAD/HLD -Continue aspirin, statin  GERD -Continue famotidine 20 mg daily   History of constipation -Continue laxative as needed for mild constipation  OSA and insomnia -Continue nightly CPAP.    Chronic alcoholism -Reports using 3-4 beers daily.  CIWA protocol.  Mobility: Encourage ambulation. Living condition: Lives at home Goals of care:   Code Status: Full Code  Nutritional status: Body mass index is 22.69 kg/m.      Diet:  Diet Order             Diet heart healthy/carb modified Room  service appropriate? Yes; Fluid consistency: Thin  Diet effective now                  DVT prophylaxis:  enoxaparin (LOVENOX) injection 40 mg Start: 02/06/21 0800 Place TED hose Start: 02/05/21 2131   Antimicrobials: IV Rocephin and azithromycin Fluid: None Consultants:  None Family Communication: None at bedside  Status is: Observation  Continue in-hospital care because: Clinically improving on IV antibiotics.  But still symptomatic on ambulation.  Continue to monitor for next 24 hours. Level of care: Telemetry Medical   Dispo: The patient is from: Home              Anticipated d/c is to: Home hopefully tomorrow with home health PT              Patient currently is not medically stable to d/c.   Difficult to place patient No     Infusions:   sodium chloride 10 mL/hr at 02/07/21 9242   azithromycin 500 mg (02/08/21 0902)   cefTRIAXone (ROCEPHIN)  IV      Scheduled Meds:  aspirin EC  81 mg Oral QPM   enoxaparin (LOVENOX) injection  40 mg Subcutaneous Q24H   famotidine  20 mg Oral Daily   fluticasone  2 spray Each Nare Daily   glipiZIDE  2.5 mg Oral Q breakfast   guaiFENesin  1,200 mg Oral BID   insulin aspart  0-20 Units Subcutaneous TID WC   insulin aspart  0-5 Units Subcutaneous QHS   insulin glargine-yfgn  5 Units Subcutaneous Daily   metFORMIN  750 mg Oral BID   metoprolol tartrate  50 mg Oral BID   mometasone-formoterol  2 puff Inhalation BID   rosuvastatin  40 mg Oral Daily    PRN meds: sodium chloride, acetaminophen **OR** acetaminophen, guaiFENesin-dextromethorphan, ipratropium-albuterol, LORazepam, melatonin, menthol-cetylpyridinium, metoprolol tartrate, nitroGLYCERIN, ondansetron **OR** ondansetron (ZOFRAN) IV, polyethylene glycol   Antimicrobials: Anti-infectives (From admission, onward)    Start     Dose/Rate Route Frequency Ordered Stop   02/08/21 1400  ceFEPIme (MAXIPIME) 2 g in sodium chloride 0.9 % 100 mL IVPB  Status:  Discontinued        2 g 200 mL/hr over 30 Minutes Intravenous Every 8 hours 02/08/21 0839 02/08/21 1151   02/08/21 1400  cefTRIAXone (ROCEPHIN) 2 g in sodium chloride 0.9 % 100 mL IVPB        2 g 200 mL/hr over 30 Minutes Intravenous Every 24 hours 02/08/21 1151 02/10/21 1359   02/07/21 1841  ceFEPIme  (MAXIPIME) 2 g in sodium chloride 0.9 % 100 mL IVPB  Status:  Discontinued        2 g 200 mL/hr over 30 Minutes Intravenous Every 12 hours 02/07/21 0830 02/08/21 0839   02/06/21 1000  azithromycin (ZITHROMAX) 500 mg in sodium chloride 0.9 % 250 mL IVPB        500 mg 250 mL/hr over 60 Minutes Intravenous Every 24 hours 02/05/21 2141 02/10/21 0959   02/06/21 0600  ceFEPIme (MAXIPIME) 2 g in sodium chloride 0.9 % 100 mL IVPB  Status:  Discontinued        2 g 200 mL/hr over 30 Minutes Intravenous Every 8 hours 02/05/21 2150 02/07/21 0830   02/05/21 2000  ceFEPIme (MAXIPIME) 2 g in sodium chloride 0.9 % 100 mL IVPB        2 g 200 mL/hr over 30 Minutes Intravenous  Once 02/05/21 1948 02/05/21 2042   02/05/21 2000  azithromycin (ZITHROMAX) 500  mg in sodium chloride 0.9 % 250 mL IVPB        500 mg 250 mL/hr over 60 Minutes Intravenous  Once 02/05/21 1948 02/05/21 2152       Objective: Vitals:   02/08/21 0810 02/08/21 1147  BP: 118/71 103/68  Pulse: 82 74  Resp: 18 20  Temp: (!) 97.3 F (36.3 C) 97.6 F (36.4 C)  SpO2: 93% 93%    Intake/Output Summary (Last 24 hours) at 02/08/2021 1251 Last data filed at 02/08/2021 1141 Gross per 24 hour  Intake 719.13 ml  Output 1950 ml  Net -1230.87 ml   Filed Weights   02/05/21 1852 02/06/21 2011  Weight: 72.6 kg 65.7 kg   Weight change:  Body mass index is 22.69 kg/m.   Physical Exam: General exam: Pleasant, elderly Caucasian male.  Not in physical distress Skin: No rashes, lesions or ulcers. HEENT: Atraumatic, normocephalic, no obvious bleeding Lungs: Clear to auscultation bilaterally at rest CVS: Regular rate and rhythm, no murmur GI/Abd soft, nontender, nondistended, bowel sound present CNS: Alert, awake, oriented x3 Psychiatry: More appropriate Extremities: No pedal edema, no calf tenderness  Data Review: I have personally reviewed the laboratory data and studies available.  F/u labs ordered Unresulted Labs (From admission,  onward)     Start     Ordered   02/07/21 0500  CBC with Differential/Platelet  Daily,   STAT      02/06/21 0926   02/07/21 1991  Basic metabolic panel  Daily,   STAT      02/06/21 0926            Signed, Terrilee Croak, MD [facility] 02/08/2021

## 2021-02-08 NOTE — Progress Notes (Signed)
Physical Therapy Treatment Patient Details Name: Bradley [name] MRN: [id number] DOB: 11-16-1950 Today's Date: 02/08/2021   History of Present Illness Patient is a 70 yo male that presented to ED for SOB. PMH of DM2, HLD, CAD, history of smoking, severe COPD, GERD, etoh. Workup positive for rhinovirus, sepsis due to PNA.    PT Comments    Patient alert, agreeable to PT, in bed. On 2.5L able to transition to chair with supervision without RW did exhibit some unsteadiness though resistive to RW for just transfer to recliner. The patient overall demonstrated some improvement in function and progress towards goals. Ambulated several bouts with RW and CGA/supervision on 4L via [place] today. Did state he felt the same as yesterday, appeared to be a bit more winded. Time allowed for recovery and spO2 >90% after each walk. Pt up in chair with all needs in reach. The patient would benefit from further skilled PT intervention to continue to progress towards goals. Recommendation remains appropriate.       Recommendations for follow up therapy are one component of a multi-disciplinary discharge planning process, led by the attending physician.  Recommendations may be updated based on patient status, additional functional criteria and insurance authorization.  Follow Up Recommendations  Home health PT     Assistance Recommended at Discharge Intermittent Supervision/Assistance  Equipment Recommendations  Rolling walker (2 wheels)    Recommendations for Other Services       Precautions / Restrictions Precautions Precautions: Fall Restrictions Weight Bearing Restrictions: No     Mobility  Bed Mobility Overal bed mobility: Modified Independent                  Transfers Overall transfer level: Modified independent Equipment used: Rolling walker (2 wheels)                    Ambulation/Gait   Gait Distance (Feet):  (22ft, 53ft, 86ft, 89ft)           General Gait Details:  ambulation today on 4L   Stairs             Wheelchair Mobility    Modified Rankin (Stroke Patients Only)       Balance Overall balance assessment: Mild deficits observed, not formally tested                                          Cognition Arousal/Alertness: Awake/alert Behavior During Therapy: WFL for tasks assessed/performed Overall Cognitive Status: Within Functional Limits for tasks assessed                                          Exercises      General Comments        Pertinent Vitals/Pain Pain Assessment: No/denies pain    Home Living                          Prior Function            PT Goals (current goals can now be found in the care plan section) Progress towards PT goals: Progressing toward goals    Frequency    Min 2X/week      PT Plan Current plan remains appropriate    Co-evaluation  AM-PAC PT "6 Clicks" Mobility   Outcome Measure  Help needed turning from your back to your side while in a flat bed without using bedrails?: None Help needed moving from lying on your back to sitting on the side of a flat bed without using bedrails?: None Help needed moving to and from a bed to a chair (including a wheelchair)?: None Help needed standing up from a chair using your arms (e.g., wheelchair or bedside chair)?: None Help needed to walk in hospital room?: None Help needed climbing 3-5 steps with a railing? : A Little 6 Click Score: 23    End of Session Equipment Utilized During Treatment: Gait belt;Oxygen (2.5-4L) Activity Tolerance: Patient tolerated treatment well Patient left: in chair;with call bell/phone within reach;with chair alarm set Nurse Communication: Mobility status PT Visit Diagnosis: Difficulty in walking, not elsewhere classified (R26.2);Muscle weakness (generalized) (M62.81)     Time: 6394-3200 PT Time Calculation (min) (ACUTE ONLY): 24  min  Charges:  $Therapeutic Exercise: 23-37 mins                     Lieutenant Diego PT, DPT 12:49 PM,02/08/21

## 2021-02-09 DIAGNOSIS — A419 Sepsis, unspecified organism: Secondary | ICD-10-CM | POA: Diagnosis not present

## 2021-02-09 LAB — CBC WITH DIFFERENTIAL/PLATELET
Abs Immature Granulocytes: 0.17 10*3/uL — ABNORMAL HIGH (ref 0.00–0.07)
Basophils Absolute: 0.1 10*3/uL (ref 0.0–0.1)
Basophils Relative: 1 %
Eosinophils Absolute: 0.4 10*3/uL (ref 0.0–0.5)
Eosinophils Relative: 4 %
HCT: 42.3 % (ref 39.0–52.0)
Hemoglobin: 13.8 g/dL (ref 13.0–17.0)
Immature Granulocytes: 2 %
Lymphocytes Relative: 22 %
Lymphs Abs: 2.5 10*3/uL (ref 0.7–4.0)
MCH: 27.2 pg (ref 26.0–34.0)
MCHC: 32.6 g/dL (ref 30.0–36.0)
MCV: 83.3 fL (ref 80.0–100.0)
Monocytes Absolute: 1.1 10*3/uL — ABNORMAL HIGH (ref 0.1–1.0)
Monocytes Relative: 9 %
Neutro Abs: 7.1 10*3/uL (ref 1.7–7.7)
Neutrophils Relative %: 62 %
Platelets: 357 10*3/uL (ref 150–400)
RBC: 5.08 MIL/uL (ref 4.22–5.81)
RDW: 16 % — ABNORMAL HIGH (ref 11.5–15.5)
WBC: 11.3 10*3/uL — ABNORMAL HIGH (ref 4.0–10.5)
nRBC: 0 % (ref 0.0–0.2)

## 2021-02-09 LAB — BASIC METABOLIC PANEL
Anion gap: 10 (ref 5–15)
BUN: 19 mg/dL (ref 8–23)
CO2: 24 mmol/L (ref 22–32)
Calcium: 8.9 mg/dL (ref 8.9–10.3)
Chloride: 104 mmol/L (ref 98–111)
Creatinine, Ser: 0.85 mg/dL (ref 0.61–1.24)
GFR, Estimated: >60 mL/min (ref >60)
Glucose, Bld: 144 mg/dL — ABNORMAL HIGH (ref 70–99)
Potassium: 3.7 mmol/L (ref 3.5–5.1)
Sodium: 138 mmol/L (ref 135–145)

## 2021-02-09 LAB — GLUCOSE, CAPILLARY
Glucose-Capillary: 144 mg/dL — ABNORMAL HIGH (ref 70–99)
Glucose-Capillary: 163 mg/dL — ABNORMAL HIGH (ref 70–99)

## 2021-02-09 MED ORDER — CEFDINIR 300 MG PO CAPS: 300.0000 mg | ORAL_CAPSULE | Freq: Two times a day (BID) | ORAL | 0 refills | Status: AC

## 2021-02-09 MED ORDER — CEFDINIR 300 MG PO CAPS
300.0000 mg | ORAL_CAPSULE | Freq: Two times a day (BID) | ORAL | Status: DC
  Administered 2021-02-09: 10:00:00 300 mg via ORAL
  Filled 2021-02-09 (×3): qty 1

## 2021-02-09 MED ORDER — PREDNISONE 20 MG PO TABS
40.0000 mg | ORAL_TABLET | Freq: Every day | ORAL | Status: DC
  Administered 2021-02-09: 40 mg via ORAL
  Filled 2021-02-09: qty 2

## 2021-02-09 MED ORDER — GUAIFENESIN ER 600 MG PO TB12
1200.0000 mg | ORAL_TABLET | Freq: Two times a day (BID) | ORAL | 0 refills | Status: AC
Start: 2021-02-09 — End: 2021-02-16

## 2021-02-09 MED ORDER — METFORMIN HCL 1000 MG PO TABS: 1000.0000 mg | ORAL_TABLET | Freq: Two times a day (BID) | ORAL | 2 refills | Status: DC

## 2021-02-09 MED ORDER — SACCHAROMYCES BOULARDII 250 MG PO CAPS: 250.0000 mg | ORAL_CAPSULE | Freq: Two times a day (BID) | ORAL | 0 refills | Status: AC

## 2021-02-09 MED ORDER — PREDNISONE 10 MG PO TABS: ORAL_TABLET | ORAL | 0 refills | Status: DC

## 2021-02-09 NOTE — Discharge Summary (Signed)
Physician Discharge Summary  Bradley [name] DSK:[id number] DOB: 02-10-1951 DOA: 02/05/2021  PCP: Valera Castle, MD  Admit date: 02/05/2021 Discharge date: 02/09/2021  Admitted From: Home Discharge disposition: Home health PT   Code Status: Full Code   Discharge Diagnosis:   Principal Problem:   Sepsis ([place]) Active Problems:   Leukocytosis   CAP (community acquired pneumonia)   Adjustment disorder with mixed anxiety and depressed mood   Insomnia   Community acquired pneumonia   Pneumonia   Type 2 diabetes mellitus with vascular disease ([place])   HLD (hyperlipidemia)   OSA (obstructive sleep apnea)   PAD (peripheral artery disease) ([facility])   Tachycardia   Hyperlipidemia associated with type 2 diabetes mellitus ([place])   Acute on chronic respiratory failure ([place])   History of tobacco use disorder   Alcohol use disorder, moderate, dependence ([place])    Chief Complaint  Patient presents with   Shortness of Breath   Brief narrative: Bradley [name] is a 70 y.o. male with PMH significant for DM2, HTN, HLD, CAD, history of smoking, severe COPD, GERD, chronic alcohol use about 3-4 beers per day.. Patient presented to the ED on 12/5 with complaint of progressive worsening of shortness of breath for 5 days associated with yellowish phlegm, chest tightness.  In the ED, patient was afebrile, heart rate 104, respiratory 33, blood pressure 122/49, 99% oxygen on 3 L nasal cannula. While in the ED, he had an episode of vomiting as well. Labs with WC count 17.1, hemoglobin 15.1, lactic acid 1.6 Respiratory virus panel positive for rhinovirus. CT angio of chest did not show any evidence of pulm embolism.  But it showed multifocal infiltrates superimposed over severe emphysematous changes In the ED, patient was started on Solu-Medrol, IV antibiotics, DuoNeb, IV fluid and admitted to hospitalist service.  Subjective: Patient was seen and examined this morning.  Sitting up in chair.  Not in  distress.  On 2 L documented exam.   Not symptomatic at rest.  Feels comfortable going home today.  Hospital course: Sepsis due to multifocal pneumonia -Presented with 5 days of progressively worsening chronic shortness of breath in the setting of severe COPD -Met sepsis criteria with tachycardia, tachypnea, leukocytosis and multifocal infiltrates in CT chest. -Currently clinically improving with antibiotics.  WBC count improving, procalcitonin level improving.  Because of his protracted symptoms, I would continue oral Omnicef for next 5 days at home with probiotics. -Also continue tapering course of prednisone. Recent Labs  Lab 02/05/21 1851 02/06/21 0625 02/07/21 0410 02/08/21 0408 02/09/21 0414  WBC 17.9* 12.2* 15.5* 11.2* 11.3*  LATICACIDVEN 1.6 1.4  --   --   --   PROCALCITON  --  0.36 0.47 0.25  --    Rhinovirus infection -Respiratory-positive for rhinovirus.  Supportive care  Acute on chronic respiratory failure with hypoxia -Baseline oxygen requirement 2 to 3 L.  Initially required higher flow.  Gradually improving.  Acute exacerbation of COPD -Home meds include Advair, ProAir, Singulair.  Not on chronic prednisone. -currently on IV Solu-Medrol 40 mg daily.  Discharged on a tapering course of prednisone.  Continue bronchodilators.  Uncontrolled type 2 diabetes mellitus with hyperglycemia -A1c 8.3 on 12/6 -Home meds include metformin 750 mg twice daily. -I will increase it to metformin 1000 mg twice daily  Essential hypertension -Home meds include metoprolol 50 mg daily, Lasix 20 mg daily -Continue the same.  History of CAD/HLD -Continue aspirin, statin  GERD -Continue famotidine 20 mg daily   History  of constipation -Continue laxative as needed for mild constipation  OSA and insomnia -Continue nightly CPAP.    Chronic alcoholism -Reports using 3-4 beers daily.  CIWA protocol.  Mobility: Encourage ambulation. Living condition: Lives at home Goals of  care:   Code Status: Full Code  Nutritional status: Body mass index is 22.69 kg/m.       Discharge Medications:   Allergies as of 02/09/2021   No Known Allergies      Medication List     STOP taking these medications    metFORMIN 750 MG 24 hr tablet Commonly known as: GLUCOPHAGE-XR Replaced by: metFORMIN 1000 MG tablet       TAKE these medications    aspirin EC 81 MG tablet Take 81 mg by mouth every evening.   cefdinir 300 MG capsule Commonly known as: OMNICEF Take 1 capsule (300 mg total) by mouth every 12 (twelve) hours for 5 days.   dextromethorphan-guaiFENesin 10-100 MG/5ML liquid Commonly known as: ROBITUSSIN-DM Take 5 mLs by mouth every 4 (four) hours as needed.   famotidine 20 MG tablet Commonly known as: PEPCID Take 20 mg by mouth daily.   Fluticasone-Salmeterol 250-50 MCG/DOSE Aepb Commonly known as: ADVAIR Inhale 1 puff into the lungs 2 (two) times daily.   furosemide 20 MG tablet Commonly known as: LASIX Take 20 mg by mouth daily.   glucose blood test strip Use once daily. Use as instructed. DX: E11.9   guaiFENesin 600 MG 12 hr tablet Commonly known as: MUCINEX Take 2 tablets (1,200 mg total) by mouth 2 (two) times daily for 7 days.   levalbuterol 0.31 MG/3ML nebulizer solution Commonly known as: XOPENEX Take 1 ampule by nebulization every 6 (six) hours as needed.   metFORMIN 1000 MG tablet Commonly known as: Glucophage Take 1 tablet (1,000 mg total) by mouth 2 (two) times daily with a meal. Replaces: metFORMIN 750 MG 24 hr tablet   metoprolol tartrate 50 MG tablet Commonly known as: LOPRESSOR Take 50 mg by mouth 2 (two) times daily.   montelukast 10 MG tablet Commonly known as: SINGULAIR Take 10 mg by mouth at bedtime.   nitroGLYCERIN 0.4 MG SL tablet Commonly known as: NITROSTAT Place 0.4 mg under the tongue every 5 (five) minutes x 3 doses as needed for chest pain.   polyethylene glycol 17 g packet Commonly known as: MIRALAX  / GLYCOLAX Take 17 g by mouth daily.   predniSONE 10 MG tablet Commonly known as: DELTASONE Take 4 tablets daily X 2 days, then, Take 3 tablets daily X 2 days, then, Take 2 tablets daily X 2 days, then, Take 1 tablets daily X 1 day.   ProAir RespiClick 048 (90 Base) MCG/ACT Aepb Generic drug: Albuterol Sulfate INHALE 1 PUFF BY MOUTH EVERY 4 HOURS AS NEEDED FOR WHEEZING   rosuvastatin 40 MG tablet Commonly known as: CRESTOR Take 40 mg by mouth daily.   saccharomyces boulardii 250 MG capsule Commonly known as: FLORASTOR Take 1 capsule (250 mg total) by mouth 2 (two) times daily for 5 days.               Durable Medical Equipment  (From admission, onward)           Start     Ordered   02/07/21 1614  For home use only DME Walker rolling  Once       Question Answer Comment  Walker: With 5 Inch Wheels   Patient needs a walker to treat with the following condition Impaired  mobility      02/07/21 1613            Wound care:     Discharge Instructions:   Discharge Instructions     Call MD for:  difficulty breathing, headache or visual disturbances   Complete by: As directed    Call MD for:  extreme fatigue   Complete by: As directed    Call MD for:  hives   Complete by: As directed    Call MD for:  persistant dizziness or light-headedness   Complete by: As directed    Call MD for:  persistant nausea and vomiting   Complete by: As directed    Call MD for:  severe uncontrolled pain   Complete by: As directed    Call MD for:  temperature >100.4   Complete by: As directed    Diet general   Complete by: As directed    Discharge instructions   Complete by: As directed    Discharge instructions for diabetes mellitus: Check blood sugar 3 times a day and bedtime at home. If blood sugar running above 200 or less than 70 please call your MD to adjust insulin. If you notice signs and symptoms of hypoglycemia (low blood sugar) like jitteriness, confusion, thirst,  tremor and sweating, please check blood sugar, drink sugary drink/biscuits/sweets to increase sugar level and call MD or return to ER.    General discharge instructions:  Follow with Primary MD Valera Castle, MD in 7 days   Get CBC/BMP checked in next visit within 1 week by PCP or SNF MD. (We routinely change or add medications that can affect your baseline labs and fluid status, therefore we recommend that you get the mentioned basic workup next visit with your PCP, your PCP may decide not to get them or add new tests based on their clinical decision)  On your next visit with your PCP, please get your medicines reviewed and adjusted.  Please request your PCP  to go over all hospital tests, procedures, radiology results at the follow up, please get all Hospital records sent to your PCP by signing hospital release before you go home.  Activity: As tolerated with Full fall precautions use walker/cane & assistance as needed  Avoid using any recreational substances like cigarette, tobacco, alcohol, or non-prescribed drug.  If you experience worsening of your admission symptoms, develop shortness of breath, life threatening emergency, suicidal or homicidal thoughts you must seek medical attention immediately by calling 911 or calling your MD immediately  if symptoms less severe.  You must read complete instructions/literature along with all the possible adverse reactions/side effects for all the medicines you take and that have been prescribed to you. Take any new medicine only after you have completely understood and accepted all the possible adverse reactions/side effects.   Do not drive, operate heavy machinery, perform activities at heights, swimming or participation in water activities or provide baby sitting services if your were admitted for syncope or siezures until you have seen by Primary MD or a Neurologist and advised to do so again.  Do not drive when taking Pain medications.   Do not take more than prescribed Pain, Sleep and Anxiety Medications  Wear Seat belts while driving.  Please note You were cared for by a hospitalist during your hospital stay. If you have any questions about your discharge medications or the care you received while you were in the hospital after you are discharged, you can call the unit  and asked to speak with the hospitalist on call if the hospitalist that took care of you is not available. Once you are discharged, your primary care physician will handle any further medical issues. Please note that NO REFILLS for any discharge medications will be authorized once you are discharged, as it is imperative that you return to your primary care physician (or establish a relationship with a primary care physician if you do not have one) for your aftercare needs so that they can reassess your need for medications and monitor your lab values.   Increase activity slowly   Complete by: As directed        Follow ups:    Follow-up Information     Olmedo, Guy Begin, MD Follow up.   Specialty: Family Medicine Contact information: [place] [address] [phone]                 Discharge Exam:   Vitals:   02/08/21 2159 02/09/21 0039 02/09/21 0326 02/09/21 1206  BP: 114/75 (!) 105/55 111/67 105/60  Pulse: (!) 104 70 73 75  Resp: 19 18 18 20   Temp: 98 F (36.7 C) 97.8 F (36.6 C) 98 F (36.7 C) 97.7 F (36.5 C)  TempSrc:  Oral  Oral  SpO2: 93% 96% 94% 92%  Weight:      Height:        Body mass index is 22.69 kg/m.  General exam: Pleasant, elderly Caucasian male.  Not in physical distress Skin: No rashes, lesions or ulcers. HEENT: Atraumatic, normocephalic, no obvious bleeding Lungs: Clear to auscultation bilaterally at rest CVS: Regular rate and rhythm, no murmur GI/Abd soft, nontender, nondistended, bowel sound present CNS: Alert, awake, oriented x3 Psychiatry: More appropriate Extremities: No  pedal edema, no calf tenderness  Time coordinating discharge: 35 minutes   The results of significant diagnostics from this hospitalization (including imaging, microbiology, ancillary and laboratory) are listed below for reference.    Procedures and Diagnostic Studies:   CT Angio Chest PE W and/or Wo Contrast  Result Date: 02/05/2021 CLINICAL DATA:  Shortness of breath and abdominal distension, initial encounter EXAM: CT ANGIOGRAPHY CHEST CT ABDOMEN AND PELVIS WITH CONTRAST TECHNIQUE: Multidetector CT imaging of the chest was performed using the standard protocol during bolus administration of intravenous contrast. Multiplanar CT image reconstructions and MIPs were obtained to evaluate the vascular anatomy. Multidetector CT imaging of the abdomen and pelvis was performed using the standard protocol during bolus administration of intravenous contrast. CONTRAST:  29m OMNIPAQUE IOHEXOL 350 MG/ML SOLN COMPARISON:  Chest x-ray from earlier in the same day, CT from 12/06/2020, 08/22/2017. FINDINGS: CTA CHEST FINDINGS Cardiovascular: Atherosclerotic calcifications of the thoracic aorta are seen. No aneurysmal dilatation or dissection is noted. No cardiac enlargement is noted. Diffuse coronary calcifications are seen. Pulmonary artery is well visualized with a normal branching pattern. No filling defect to suggest pulmonary embolism is identified. Mediastinum/Nodes: Thoracic inlet is within normal limits. No sizable hilar or mediastinal adenopathy is noted. The esophagus as visualized is within normal limits. Lungs/Pleura: Emphysematous changes of lungs are seen. Focal infiltrate is noted in the lingula laterally similar to that seen on recent plain film examination. Stable nodular density is noted in the left upper lobe on image number 31 of series 7 unchanged from the prior study. No new left-sided nodule is noted. Mild superimposed infiltrate is noted in the right lung base similar to that seen on prior plain  film examination. Chronic scarring is noted in  the medial right lung consistent with prior partial lobectomy. Stable scarring is noted in the right upper lobe. Nodular changes seen in the right upper lobe best seen on image number 54 of series 7. This measures approximately 14 mm in greatest dimension and appears stable from the prior study. Stable nodule in the right lung base is noted on image number 71 similar to that seen on the prior study. The nodular change posteriorly is again seen on image number 83 of series 7 measuring up to 11 mm slightly more prominent than that seen on the prior exam. Calcified pleural plaques are noted on the right. Musculoskeletal: Degenerative changes of the thoracic spine are noted. No acute rib abnormality is seen. No compression deformity is noted. Review of the MIP images confirms the above findings. CT ABDOMEN and PELVIS FINDINGS Hepatobiliary: No focal liver abnormality is seen. No gallstones, gallbladder wall thickening, or biliary dilatation. Pancreas: Unremarkable. No pancreatic ductal dilatation or surrounding inflammatory changes. Spleen: Calcified granulomas are noted. Adrenals/Urinary Tract: Adrenal glands are within normal limits. Kidneys demonstrate a normal enhancement pattern. Left renal cysts are seen stable from prior exam. Ureters show no obstructive changes. Bladder is partially distended. Stomach/Bowel: Mild diverticular change of the colon is noted. No obstructive changes are seen. The appendix is within normal limits. Small bowel and stomach are unremarkable. Vascular/Lymphatic: Atherosclerotic calcifications of the abdominal aorta are noted. No aneurysmal dilatation is seen. No lymphadenopathy is noted. Reproductive: Prostate is unremarkable. Other: No abdominal wall hernia or abnormality. No abdominopelvic ascites. Musculoskeletal: Degenerative change of the lumbar spine is seen. Review of the MIP images confirms the above findings. IMPRESSION: CTA of the  chest: No evidence of pulmonary emboli. Multiple nodular densities are identified bilaterally. The nodule in the right lower lobe best seen on image number 83 of series 7 is slightly more prominent than that seen on the prior exam which may be related to the imaging technique. This can be followed on subsequent exams. Patchy right basilar and left upper lobe infiltrates superimposed over severe emphysematous changes. Changes consistent with prior partial resection in the right lower lobe. CT of the abdomen and pelvis: Changes of prior granulomatous disease. Diverticulosis without diverticulitis. No acute abnormality noted. Electronically Signed   By: Inez Catalina M.D.   On: 02/05/2021 20:59   CT ABDOMEN PELVIS W CONTRAST  Result Date: 02/05/2021 CLINICAL DATA:  Shortness of breath and abdominal distension, initial encounter EXAM: CT ANGIOGRAPHY CHEST CT ABDOMEN AND PELVIS WITH CONTRAST TECHNIQUE: Multidetector CT imaging of the chest was performed using the standard protocol during bolus administration of intravenous contrast. Multiplanar CT image reconstructions and MIPs were obtained to evaluate the vascular anatomy. Multidetector CT imaging of the abdomen and pelvis was performed using the standard protocol during bolus administration of intravenous contrast. CONTRAST:  27m OMNIPAQUE IOHEXOL 350 MG/ML SOLN COMPARISON:  Chest x-ray from earlier in the same day, CT from 12/06/2020, 08/22/2017. FINDINGS: CTA CHEST FINDINGS Cardiovascular: Atherosclerotic calcifications of the thoracic aorta are seen. No aneurysmal dilatation or dissection is noted. No cardiac enlargement is noted. Diffuse coronary calcifications are seen. Pulmonary artery is well visualized with a normal branching pattern. No filling defect to suggest pulmonary embolism is identified. Mediastinum/Nodes: Thoracic inlet is within normal limits. No sizable hilar or mediastinal adenopathy is noted. The esophagus as visualized is within normal  limits. Lungs/Pleura: Emphysematous changes of lungs are seen. Focal infiltrate is noted in the lingula laterally similar to that seen on recent plain film examination. Stable nodular  density is noted in the left upper lobe on image number 31 of series 7 unchanged from the prior study. No new left-sided nodule is noted. Mild superimposed infiltrate is noted in the right lung base similar to that seen on prior plain film examination. Chronic scarring is noted in the medial right lung consistent with prior partial lobectomy. Stable scarring is noted in the right upper lobe. Nodular changes seen in the right upper lobe best seen on image number 54 of series 7. This measures approximately 14 mm in greatest dimension and appears stable from the prior study. Stable nodule in the right lung base is noted on image number 71 similar to that seen on the prior study. The nodular change posteriorly is again seen on image number 83 of series 7 measuring up to 11 mm slightly more prominent than that seen on the prior exam. Calcified pleural plaques are noted on the right. Musculoskeletal: Degenerative changes of the thoracic spine are noted. No acute rib abnormality is seen. No compression deformity is noted. Review of the MIP images confirms the above findings. CT ABDOMEN and PELVIS FINDINGS Hepatobiliary: No focal liver abnormality is seen. No gallstones, gallbladder wall thickening, or biliary dilatation. Pancreas: Unremarkable. No pancreatic ductal dilatation or surrounding inflammatory changes. Spleen: Calcified granulomas are noted. Adrenals/Urinary Tract: Adrenal glands are within normal limits. Kidneys demonstrate a normal enhancement pattern. Left renal cysts are seen stable from prior exam. Ureters show no obstructive changes. Bladder is partially distended. Stomach/Bowel: Mild diverticular change of the colon is noted. No obstructive changes are seen. The appendix is within normal limits. Small bowel and stomach are  unremarkable. Vascular/Lymphatic: Atherosclerotic calcifications of the abdominal aorta are noted. No aneurysmal dilatation is seen. No lymphadenopathy is noted. Reproductive: Prostate is unremarkable. Other: No abdominal wall hernia or abnormality. No abdominopelvic ascites. Musculoskeletal: Degenerative change of the lumbar spine is seen. Review of the MIP images confirms the above findings. IMPRESSION: CTA of the chest: No evidence of pulmonary emboli. Multiple nodular densities are identified bilaterally. The nodule in the right lower lobe best seen on image number 83 of series 7 is slightly more prominent than that seen on the prior exam which may be related to the imaging technique. This can be followed on subsequent exams. Patchy right basilar and left upper lobe infiltrates superimposed over severe emphysematous changes. Changes consistent with prior partial resection in the right lower lobe. CT of the abdomen and pelvis: Changes of prior granulomatous disease. Diverticulosis without diverticulitis. No acute abnormality noted. Electronically Signed   By: Inez Catalina M.D.   On: 02/05/2021 20:59   DG Chest Port 1 View  Result Date: 02/05/2021 CLINICAL DATA:  Questionable sepsis. EXAM: PORTABLE CHEST 1 VIEW COMPARISON:  November 22, 2020 FINDINGS: There is marked severity emphysematous lung disease. Mild areas of atelectasis and/or infiltrate are seen along the periphery of the mid to lower lung fields, left greater than right. There is a small right pleural effusion. No pneumothorax is identified. Radiopaque surgical clips are seen overlying the right hilum. The heart size and mediastinal contours are within normal limits. Marked severity calcification of the aortic arch is noted. The visualized skeletal structures are unremarkable. IMPRESSION: 1. Marked severity emphysematous lung disease with mild bilateral lower lobe atelectasis and/or infiltrate. 2. Small right pleural effusion. Electronically Signed    By: Virgina Norfolk M.D.   On: 02/05/2021 19:38     Labs:   Basic Metabolic Panel: Recent Labs  Lab 02/05/21 1851 02/05/21 2259 02/06/21  5465 02/07/21 0410 02/08/21 0408 02/09/21 0414  NA 134*  --  133* 136 137 138  K 4.4  --  4.1 3.8 3.5 3.7  CL 99  --  102 104 104 104  CO2 25  --  23 25 24 24   GLUCOSE 194*  --  343* 247* 155* 144*  BUN 15  --  16 23 21 19   CREATININE 0.98  --  0.95 1.12 0.80 0.85  CALCIUM 9.2  --  8.8* 8.7* 8.7* 8.9  MG  --  2.1  --   --   --   --   PHOS  --  2.5  --   --   --   --    GFR Estimated Creatinine Clearance: 74.1 mL/min (by C-G formula based on SCr of 0.85 mg/dL). Liver Function Tests: Recent Labs  Lab 02/05/21 1851  AST 47*  ALT 40  ALKPHOS 100  BILITOT 1.2  PROT 8.0  ALBUMIN 3.4*   No results for input(s): LIPASE, AMYLASE in the last 168 hours. No results for input(s): AMMONIA in the last 168 hours. Coagulation profile Recent Labs  Lab 02/05/21 1851  INR 1.0    CBC: Recent Labs  Lab 02/05/21 1851 02/06/21 0625 02/07/21 0410 02/08/21 0408 02/09/21 0414  WBC 17.9* 12.2* 15.5* 11.2* 11.3*  NEUTROABS 15.3*  --  12.2* 7.5 7.1  HGB 15.1 13.1 12.4* 12.7* 13.8  HCT 47.4 40.6 38.6* 39.4 42.3  MCV 84.8 84.1 83.9 83.5 83.3  PLT 344 301 327 349 357   Cardiac Enzymes: No results for input(s): CKTOTAL, CKMB, CKMBINDEX, TROPONINI in the last 168 hours. BNP: Invalid input(s): POCBNP CBG: Recent Labs  Lab 02/08/21 1227 02/08/21 1727 02/08/21 2152 02/09/21 0925 02/09/21 1308  GLUCAP 97 99 96 144* 163*   D-Dimer No results for input(s): DDIMER in the last 72 hours. Hgb A1c No results for input(s): HGBA1C in the last 72 hours. Lipid Profile No results for input(s): CHOL, HDL, LDLCALC, TRIG, CHOLHDL, LDLDIRECT in the last 72 hours. Thyroid function studies No results for input(s): TSH, T4TOTAL, T3FREE, THYROIDAB in the last 72 hours.  Invalid input(s): FREET3 Anemia work up No results for input(s): VITAMINB12,  FOLATE, FERRITIN, TIBC, IRON, RETICCTPCT in the last 72 hours. Microbiology Recent Results (from the past 240 hour(s))  Resp Panel by RT-PCR (Flu A&B, Covid) Nasopharyngeal Swab     Status: None   Collection Time: 02/05/21  6:51 PM   Specimen: Nasopharyngeal Swab; Nasopharyngeal(NP) swabs in vial transport medium  Result Value Ref Range Status   SARS Coronavirus 2 by RT PCR NEGATIVE NEGATIVE Final    Comment: (NOTE) SARS-CoV-2 target nucleic acids are NOT DETECTED.  The SARS-CoV-2 RNA is generally detectable in upper respiratory specimens during the acute phase of infection. The lowest concentration of SARS-CoV-2 viral copies this assay can detect is 138 copies/mL. A negative result does not preclude SARS-Cov-2 infection and should not be used as the sole basis for treatment or other patient management decisions. A negative result may occur with  improper specimen collection/handling, submission of specimen other than nasopharyngeal swab, presence of viral mutation(s) within the areas targeted by this assay, and inadequate number of viral copies(<138 copies/mL). A negative result must be combined with clinical observations, patient history, and epidemiological information. The expected result is Negative.  Fact Sheet for Patients:  EntrepreneurPulse.com.au  Fact Sheet for Healthcare Providers:  IncredibleEmployment.be  This test is no t yet approved or cleared by the [place] and  has been authorized for detection and/or diagnosis of SARS-CoV-2 by FDA under an Emergency Use Authorization (EUA). This EUA will remain  in effect (meaning this test can be used) for the duration of the COVID-19 declaration under Section 564(b)(1) of the Act, 21 U.S.C.section 360bbb-3(b)(1), unless the authorization is terminated  or revoked sooner.       Influenza A by PCR NEGATIVE NEGATIVE Final   Influenza B by PCR NEGATIVE NEGATIVE Final    Comment:  (NOTE) The Xpert Xpress SARS-CoV-2/FLU/RSV plus assay is intended as an aid in the diagnosis of influenza from Nasopharyngeal swab specimens and should not be used as a sole basis for treatment. Nasal washings and aspirates are unacceptable for Xpert Xpress SARS-CoV-2/FLU/RSV testing.  Fact Sheet for Patients: EntrepreneurPulse.com.au  Fact Sheet for Healthcare Providers: IncredibleEmployment.be  This test is not yet approved or cleared by the [place] FDA and has been authorized for detection and/or diagnosis of SARS-CoV-2 by FDA under an Emergency Use Authorization (EUA). This EUA will remain in effect (meaning this test can be used) for the duration of the COVID-19 declaration under Section 564(b)(1) of the Act, 21 U.S.C. section 360bbb-3(b)(1), unless the authorization is terminated or revoked.  Performed at [facility], [address] [place], [place] [address]   Blood Culture (routine x 2)     Status: None (Preliminary result)   Collection Time: 02/05/21  7:41 PM   Specimen: BLOOD  Result Value Ref Range Status   Specimen Description BLOOD LEFT ANTECUBITAL  Final   Special Requests   Final    BOTTLES DRAWN AEROBIC AND ANAEROBIC Blood Culture adequate volume   Culture   Final    NO GROWTH 4 DAYS Performed at [facility], [address] [place], [place] [address]    Report Status PENDING  Incomplete  Blood Culture (routine x 2)     Status: None (Preliminary result)   Collection Time: 02/05/21  7:41 PM   Specimen: BLOOD  Result Value Ref Range Status   Specimen Description BLOOD RIGHT ANTECUBITAL  Final   Special Requests   Final    BOTTLES DRAWN AEROBIC AND ANAEROBIC Blood Culture adequate volume   Culture   Final    NO GROWTH 4 DAYS Performed at [facility], [address], [place], [place] [address]    Report Status PENDING  Incomplete  Urine Culture     Status: None   Collection Time:  02/05/21  9:55 PM   Specimen: In/Out Cath Urine  Result Value Ref Range Status   Specimen Description   Final    IN/OUT CATH URINE Performed at [facility], [address] [place], [place] [address]    Special Requests   Final    NONE Performed at [facility], [address], [place], [place] [address]    Culture   Final    NO GROWTH Performed at [facility], [place] [address] [place], [place] [address]    Report Status 02/07/2021 FINAL  Final  Respiratory (~20 pathogens) panel by PCR     Status: Abnormal   Collection Time: 02/05/21  9:55 PM   Specimen: Nasopharyngeal Swab; Respiratory  Result Value Ref Range Status   Adenovirus NOT DETECTED NOT DETECTED Final   Coronavirus 229E NOT DETECTED NOT DETECTED Final    Comment: (NOTE) The Coronavirus on the Respiratory Panel, DOES NOT test for the novel  Coronavirus (2019 nCoV)    Coronavirus HKU1 NOT DETECTED NOT DETECTED Final  Coronavirus NL63 NOT DETECTED NOT DETECTED Final   Coronavirus OC43 NOT DETECTED NOT DETECTED Final   Metapneumovirus NOT DETECTED NOT DETECTED Final   Rhinovirus / Enterovirus DETECTED (A) NOT DETECTED Final   Influenza A NOT DETECTED NOT DETECTED Final   Influenza B NOT DETECTED NOT DETECTED Final   Parainfluenza Virus 1 NOT DETECTED NOT DETECTED Final   Parainfluenza Virus 2 NOT DETECTED NOT DETECTED Final   Parainfluenza Virus 3 NOT DETECTED NOT DETECTED Final   Parainfluenza Virus 4 NOT DETECTED NOT DETECTED Final   Respiratory Syncytial Virus NOT DETECTED NOT DETECTED Final   Bordetella pertussis NOT DETECTED NOT DETECTED Final   Bordetella Parapertussis NOT DETECTED NOT DETECTED Final   Chlamydophila pneumoniae NOT DETECTED NOT DETECTED Final   Mycoplasma pneumoniae NOT DETECTED NOT DETECTED Final    Comment: Performed at [facility], [place] [address], [place], [place] [address]  MRSA Next Gen by PCR, Nasal     Status: None   Collection Time: 02/05/21  9:55 PM    Specimen: Nasopharyngeal Swab; Nasal Swab  Result Value Ref Range Status   MRSA by PCR Next Gen NOT DETECTED NOT DETECTED Final    Comment: (NOTE) The GeneXpert MRSA Assay (FDA approved for NASAL specimens only), is one component of a comprehensive MRSA colonization surveillance program. It is not intended to diagnose MRSA infection nor to guide or monitor treatment for MRSA infections. Test performance is not FDA approved in patients less than 47 years old. Performed at [facility], [address] [place], [place] [address]      Signed: Terrilee Croak  [facility] 02/09/2021, 3:42 PM

## 2021-02-10 LAB — CULTURE, BLOOD (ROUTINE X 2)
Culture: NO GROWTH
Culture: NO GROWTH
Special Requests: ADEQUATE
Special Requests: ADEQUATE

## 2021-03-01 ENCOUNTER — Other Ambulatory Visit: Payer: Self-pay

## 2021-03-01 ENCOUNTER — Emergency Department: Payer: Medicare HMO

## 2021-03-01 ENCOUNTER — Encounter: Payer: Self-pay | Admitting: Emergency Medicine

## 2021-03-01 ENCOUNTER — Inpatient Hospital Stay
Admission: EM | Admit: 2021-03-01 | Discharge: 2021-03-06 | DRG: 871 | Disposition: A | Discharge status: Home / Self Care | Payer: Medicare HMO | Attending: Internal Medicine | Admitting: Internal Medicine

## 2021-03-01 DIAGNOSIS — R6520 Severe sepsis without septic shock: Secondary | ICD-10-CM | POA: Diagnosis present

## 2021-03-01 DIAGNOSIS — E872 Acidosis, unspecified: Secondary | ICD-10-CM | POA: Diagnosis present

## 2021-03-01 DIAGNOSIS — A419 Sepsis, unspecified organism: Secondary | ICD-10-CM | POA: Diagnosis not present

## 2021-03-01 DIAGNOSIS — E1151 Type 2 diabetes mellitus with diabetic peripheral angiopathy without gangrene: Secondary | ICD-10-CM | POA: Diagnosis present

## 2021-03-01 DIAGNOSIS — I2510 Atherosclerotic heart disease of native coronary artery without angina pectoris: Secondary | ICD-10-CM | POA: Diagnosis present

## 2021-03-01 DIAGNOSIS — Z8616 Personal history of COVID-19: Secondary | ICD-10-CM

## 2021-03-01 DIAGNOSIS — F1020 Alcohol dependence, uncomplicated: Secondary | ICD-10-CM | POA: Diagnosis present

## 2021-03-01 DIAGNOSIS — E1165 Type 2 diabetes mellitus with hyperglycemia: Secondary | ICD-10-CM | POA: Diagnosis present

## 2021-03-01 DIAGNOSIS — J159 Unspecified bacterial pneumonia: Secondary | ICD-10-CM | POA: Diagnosis present

## 2021-03-01 DIAGNOSIS — Z7982 Long term (current) use of aspirin: Secondary | ICD-10-CM

## 2021-03-01 DIAGNOSIS — J9601 Acute respiratory failure with hypoxia: Secondary | ICD-10-CM

## 2021-03-01 DIAGNOSIS — J189 Pneumonia, unspecified organism: Secondary | ICD-10-CM

## 2021-03-01 DIAGNOSIS — E1169 Type 2 diabetes mellitus with other specified complication: Secondary | ICD-10-CM | POA: Diagnosis present

## 2021-03-01 DIAGNOSIS — E1159 Type 2 diabetes mellitus with other circulatory complications: Secondary | ICD-10-CM | POA: Diagnosis present

## 2021-03-01 DIAGNOSIS — F4323 Adjustment disorder with mixed anxiety and depressed mood: Secondary | ICD-10-CM | POA: Diagnosis present

## 2021-03-01 DIAGNOSIS — J9621 Acute and chronic respiratory failure with hypoxia: Secondary | ICD-10-CM | POA: Diagnosis present

## 2021-03-01 DIAGNOSIS — J440 Chronic obstructive pulmonary disease with acute lower respiratory infection: Secondary | ICD-10-CM | POA: Diagnosis present

## 2021-03-01 DIAGNOSIS — E785 Hyperlipidemia, unspecified: Secondary | ICD-10-CM | POA: Diagnosis present

## 2021-03-01 DIAGNOSIS — Z79899 Other long term (current) drug therapy: Secondary | ICD-10-CM

## 2021-03-01 DIAGNOSIS — Z20822 Contact with and (suspected) exposure to covid-19: Secondary | ICD-10-CM | POA: Diagnosis present

## 2021-03-01 DIAGNOSIS — Z7984 Long term (current) use of oral hypoglycemic drugs: Secondary | ICD-10-CM

## 2021-03-01 DIAGNOSIS — Z8249 Family history of ischemic heart disease and other diseases of the circulatory system: Secondary | ICD-10-CM

## 2021-03-01 DIAGNOSIS — Z87891 Personal history of nicotine dependence: Secondary | ICD-10-CM

## 2021-03-01 DIAGNOSIS — K219 Gastro-esophageal reflux disease without esophagitis: Secondary | ICD-10-CM | POA: Diagnosis present

## 2021-03-01 DIAGNOSIS — I152 Hypertension secondary to endocrine disorders: Secondary | ICD-10-CM | POA: Diagnosis present

## 2021-03-01 DIAGNOSIS — E876 Hypokalemia: Secondary | ICD-10-CM | POA: Diagnosis present

## 2021-03-01 LAB — BASIC METABOLIC PANEL
Anion gap: 10 (ref 5–15)
BUN: 28 mg/dL — ABNORMAL HIGH (ref 8–23)
CO2: 22 mmol/L (ref 22–32)
Calcium: 9.5 mg/dL (ref 8.9–10.3)
Chloride: 106 mmol/L (ref 98–111)
Creatinine, Ser: 1.1 mg/dL (ref 0.61–1.24)
GFR, Estimated: >60 mL/min (ref >60)
Glucose, Bld: 163 mg/dL — ABNORMAL HIGH (ref 70–99)
Potassium: 4.2 mmol/L (ref 3.5–5.1)
Sodium: 138 mmol/L (ref 135–145)

## 2021-03-01 LAB — PROTIME-INR
INR: 1 (ref 0.8–1.2)
Prothrombin Time: 13.2 seconds (ref 11.4–15.2)

## 2021-03-01 LAB — CBC
HCT: 46.5 % (ref 39.0–52.0)
Hemoglobin: 14.5 g/dL (ref 13.0–17.0)
MCH: 27 pg (ref 26.0–34.0)
MCHC: 31.2 g/dL (ref 30.0–36.0)
MCV: 86.4 fL (ref 80.0–100.0)
Platelets: 265 10*3/uL (ref 150–400)
RBC: 5.38 MIL/uL (ref 4.22–5.81)
RDW: 17.5 % — ABNORMAL HIGH (ref 11.5–15.5)
WBC: 14.6 10*3/uL — ABNORMAL HIGH (ref 4.0–10.5)
nRBC: 0 % (ref 0.0–0.2)

## 2021-03-01 LAB — URINALYSIS, COMPLETE (UACMP) WITH MICROSCOPIC
Bilirubin Urine: NEGATIVE
Glucose, UA: >=500 mg/dL — AB
Ketones, ur: NEGATIVE mg/dL
Leukocytes,Ua: NEGATIVE
Nitrite: NEGATIVE
Protein, ur: 100 mg/dL — AB
Specific Gravity, Urine: 1.016 (ref 1.005–1.030)
pH: 5 (ref 5.0–8.0)

## 2021-03-01 LAB — EXPECTORATED SPUTUM ASSESSMENT W GRAM STAIN, RFLX TO RESP C

## 2021-03-01 LAB — BRAIN NATRIURETIC PEPTIDE: B Natriuretic Peptide: 84.4 pg/mL (ref 0.0–100.0)

## 2021-03-01 LAB — LACTIC ACID, PLASMA
Lactic Acid, Venous: 3.7 mmol/L (ref 0.5–1.9)
Lactic Acid, Venous: 2.3 mmol/L (ref 0.5–1.9)

## 2021-03-01 LAB — PROCALCITONIN: Procalcitonin: <0.1 ng/mL

## 2021-03-01 LAB — TROPONIN I (HIGH SENSITIVITY)
Troponin I (High Sensitivity): 9 ng/L (ref <18)
Troponin I (High Sensitivity): 8 ng/L (ref <18)

## 2021-03-01 LAB — RESP PANEL BY RT-PCR (FLU A&B, COVID) ARPGX2
Influenza A by PCR: NEGATIVE
Influenza B by PCR: NEGATIVE
SARS Coronavirus 2 by RT PCR: NEGATIVE

## 2021-03-01 LAB — APTT: aPTT: 25 seconds (ref 24–36)

## 2021-03-01 MED ORDER — FUROSEMIDE 20 MG PO TABS
20.0000 mg | ORAL_TABLET | Freq: Every day | ORAL | Status: DC
  Administered 2021-03-01 – 2021-03-06 (×6): 20 mg via ORAL
  Filled 2021-03-01 (×6): qty 1

## 2021-03-01 MED ORDER — DEXTROMETHORPHAN-GUAIFENESIN 10-100 MG/5ML PO LIQD
10.0000 mL | Freq: Four times a day (QID) | ORAL | Status: DC | PRN
  Filled 2021-03-01: qty 10

## 2021-03-01 MED ORDER — MONTELUKAST SODIUM 10 MG PO TABS
10.0000 mg | ORAL_TABLET | Freq: Every day | ORAL | Status: DC
  Administered 2021-03-01 – 2021-03-05 (×5): 10 mg via ORAL
  Filled 2021-03-01 (×5): qty 1

## 2021-03-01 MED ORDER — SODIUM CHLORIDE 0.9 % IV SOLN
2.0000 g | INTRAVENOUS | Status: DC
  Administered 2021-03-01: 17:00:00 2 g via INTRAVENOUS
  Filled 2021-03-01: qty 20

## 2021-03-01 MED ORDER — ASPIRIN EC 81 MG PO TBEC
81.0000 mg | DELAYED_RELEASE_TABLET | Freq: Every evening | ORAL | Status: DC
  Administered 2021-03-01 – 2021-03-05 (×5): 81 mg via ORAL
  Filled 2021-03-01 (×5): qty 1

## 2021-03-01 MED ORDER — AZITHROMYCIN 500 MG PO TABS
500.0000 mg | ORAL_TABLET | Freq: Every day | ORAL | Status: AC
  Administered 2021-03-02 – 2021-03-05 (×4): 500 mg via ORAL
  Filled 2021-03-01 (×4): qty 1

## 2021-03-01 MED ORDER — ENOXAPARIN SODIUM 40 MG/0.4ML IJ SOSY
40.0000 mg | PREFILLED_SYRINGE | INTRAMUSCULAR | Status: DC
  Administered 2021-03-01 – 2021-03-05 (×5): 40 mg via SUBCUTANEOUS
  Filled 2021-03-01 (×5): qty 0.4

## 2021-03-01 MED ORDER — METFORMIN HCL 500 MG PO TABS
1000.0000 mg | ORAL_TABLET | Freq: Two times a day (BID) | ORAL | Status: DC
  Administered 2021-03-02 – 2021-03-06 (×8): 1000 mg via ORAL
  Filled 2021-03-01 (×11): qty 2

## 2021-03-01 MED ORDER — SODIUM CHLORIDE 0.9% FLUSH: 3.0000 mL | INTRAVENOUS | Status: DC | PRN

## 2021-03-01 MED ORDER — METOPROLOL TARTRATE 50 MG PO TABS
50.0000 mg | ORAL_TABLET | Freq: Two times a day (BID) | ORAL | Status: DC
  Administered 2021-03-01 – 2021-03-06 (×10): 50 mg via ORAL
  Filled 2021-03-01 (×11): qty 1

## 2021-03-01 MED ORDER — SODIUM CHLORIDE 0.9 % IV BOLUS (SEPSIS)
1000.0000 mL | Freq: Once | INTRAVENOUS | Status: AC
  Administered 2021-03-01: 17:00:00 1000 mL via INTRAVENOUS

## 2021-03-01 MED ORDER — SODIUM CHLORIDE 0.9 % IV SOLN: 250.0000 mL | INTRAVENOUS | Status: DC | PRN

## 2021-03-01 MED ORDER — NITROGLYCERIN 0.4 MG SL SUBL: 0.4000 mg | SUBLINGUAL_TABLET | SUBLINGUAL | Status: DC | PRN

## 2021-03-01 MED ORDER — SODIUM CHLORIDE 0.9% FLUSH
3.0000 mL | Freq: Two times a day (BID) | INTRAVENOUS | Status: DC
  Administered 2021-03-01 – 2021-03-05 (×8): 3 mL via INTRAVENOUS

## 2021-03-01 MED ORDER — SODIUM CHLORIDE 0.9 % IV SOLN
500.0000 mg | INTRAVENOUS | Status: DC
  Administered 2021-03-01: 17:00:00 500 mg via INTRAVENOUS
  Filled 2021-03-01: qty 5

## 2021-03-01 MED ORDER — LACTATED RINGERS IV SOLN: INTRAVENOUS | Status: DC

## 2021-03-01 MED ORDER — SODIUM CHLORIDE 0.9 % IV SOLN
2.0000 g | INTRAVENOUS | Status: AC
  Administered 2021-03-02 – 2021-03-05 (×4): 2 g via INTRAVENOUS
  Filled 2021-03-01 (×4): qty 2
  Filled 2021-03-01: qty 20

## 2021-03-01 MED ORDER — POLYETHYLENE GLYCOL 3350 17 G PO PACK
17.0000 g | PACK | Freq: Every day | ORAL | Status: DC
Start: 2021-03-01 — End: 2021-03-06
  Administered 2021-03-01 – 2021-03-05 (×2): 17 g via ORAL
  Filled 2021-03-01 (×4): qty 1

## 2021-03-01 MED ORDER — ROSUVASTATIN CALCIUM 20 MG PO TABS
40.0000 mg | ORAL_TABLET | Freq: Every day | ORAL | Status: DC
  Administered 2021-03-01 – 2021-03-06 (×6): 40 mg via ORAL
  Filled 2021-03-01 (×6): qty 2

## 2021-03-01 MED ORDER — MELATONIN 5 MG PO TABS
5.0000 mg | ORAL_TABLET | Freq: Every day | ORAL | Status: DC
  Administered 2021-03-01 – 2021-03-05 (×5): 5 mg via ORAL
  Filled 2021-03-01 (×7): qty 1

## 2021-03-01 MED ORDER — FAMOTIDINE 20 MG PO TABS
20.0000 mg | ORAL_TABLET | Freq: Every day | ORAL | Status: DC
  Administered 2021-03-01 – 2021-03-06 (×6): 20 mg via ORAL
  Filled 2021-03-01 (×6): qty 1

## 2021-03-01 MED ORDER — ALBUTEROL SULFATE (2.5 MG/3ML) 0.083% IN NEBU
3.0000 mL | INHALATION_SOLUTION | RESPIRATORY_TRACT | Status: DC | PRN
  Administered 2021-03-02 – 2021-03-05 (×2): 3 mL via RESPIRATORY_TRACT
  Filled 2021-03-01 (×3): qty 3

## 2021-03-01 NOTE — ED Provider Notes (Signed)
[facility] Emergency Department Provider Note   ____________________________________________   Event Date/Time   First MD Initiated Contact with Patient 03/01/21 1541     (approximate)  I have reviewed the triage vital signs and the nursing notes.   HISTORY  Chief Complaint Pneumonia    HPI Bradley [name] is a 70 y.o. male who presents for shortness of breath with a diagnosis of pneumonia made yesterday  LOCATION: Chest DURATION: 1 month prior to arrival TIMING: Initially treated in inpatient hospitalization 1 month ago with initial improvement however declined on outpatient antibiotics including azithromycin and levofloxacin in addition to prednisone.  Patient states that he has been worsening over the last week SEVERITY: Severe QUALITY: Shortness of breath CONTEXT: Patient presents complaining of worsening shortness of breath in the setting of pneumonia diagnosis and failure of outpatient antibiotics MODIFYING FACTORS: Any exertion or prolonged speaking worsens his shortness of breath and submental oxygen improves it.  Patient is on 2 L of supplemental O2 normally at that has had to be increased to 4 at this time ASSOCIATED SYMPTOMS: Chills   Per medical record review, patient has history of COPD, CAD, type 2 diabetes, and hypertension          Past Medical History:  Diagnosis Date   COPD (chronic obstructive pulmonary disease) ([place])    Coronary artery disease    Diabetes mellitus without complication ([place])    Hypertension     Patient Active Problem List   Diagnosis Date Noted   Sepsis ([place]) 02/05/2021   History of tobacco use disorder 02/05/2021   Alcohol use disorder, moderate, dependence ([place]) 02/05/2021   Acute on chronic respiratory failure ([place]) 07/30/2020   Acute on chronic respiratory failure with hypoxia ([place]) 07/29/2020   CKD (chronic kidney disease) stage 3, GFR 30-59 ml/min ([place]) 07/29/2020   Hypertension associated with  diabetes ([place]) 07/29/2020   Hyperlipidemia associated with type 2 diabetes mellitus ([place]) 07/29/2020   COVID-19 virus infection 07/29/2020   [place] 07/13/2020   Pain due to onychomycosis of toenails of both feet 01/06/2020   Type 2 diabetes mellitus with vascular disease ([place]) 01/06/2020   Chronic renal failure, stage 2 (mild) 12/01/2019   Lightheadedness 11/16/2019   Acute kidney injury ([place]) 04/20/2019   PAD (peripheral artery disease) ([place]) 04/20/2019   Tachycardia 04/20/2019   B12 deficiency 12/07/2017   OSA (obstructive sleep apnea) 11/28/2017   Lower extremity pain, left 03/06/2017   Numbness and tingling of left leg 03/06/2017   Coronary artery disease involving native coronary artery of native heart without angina pectoris 02/18/2017   Diastasis recti 02/18/2017   CVD (cardiovascular disease) 02/18/2017   Need for vaccination 10/93/2355   Umbilical hernia without obstruction and without gangrene 02/18/2017   Hemoglobinuria 08/21/2016   Anemia 08/07/2016   Pneumonia 07/15/2016   Lung mass 06/28/2016   History of cigarette smoking 06/19/2016   Screening for malignant neoplasm of respiratory organ 06/19/2016   Acute respiratory failure with hypoxia ([place]) 12/27/2015   Community acquired pneumonia 12/27/2015   Acute respiratory distress 09/19/2015   Adjustment disorder with mixed anxiety and depressed mood 04/14/2015   Insomnia 04/14/2015   Grief 04/14/2015   Hyponatremia 04/12/2015   COPD exacerbation ([place]) 10/08/2014   CAP (community acquired pneumonia) 10/08/2014   Hypogammaglobulinemia ([place]) 08/17/2014   Leukocytosis 08/03/2014   Recurrent infections 08/03/2014   Tinnitus of right ear 03/03/2014   Left carotid artery stenosis 07/21/2013   HLD (hyperlipidemia) 10/05/2010   Onychomycosis 10/05/2010  Past Surgical History:  Procedure Laterality Date   CAROTID STENT Left    LEFT HEART CATH AND CORONARY ANGIOGRAPHY Left 11/02/2019   Procedure: LEFT HEART CATH AND  CORONARY ANGIOGRAPHY;  Surgeon: Teodoro Spray, MD;  Location: [place] [facility];  Service: Cardiovascular;  Laterality: Left;   LUNG BIOPSY Right    2006   right side partial lobe removed       Prior to Admission medications   Medication Sig Start Date End Date Taking? Authorizing Provider  Albuterol Sulfate (PROAIR RESPICLICK) 478 (90 Base) MCG/ACT AEPB INHALE 1 PUFF BY MOUTH EVERY 4 HOURS AS NEEDED FOR WHEEZING 04/13/20   [provider]  aspirin EC 81 MG tablet Take 81 mg by mouth every evening.     [provider]  dextromethorphan-guaiFENesin (ROBITUSSIN-DM) 10-100 MG/5ML liquid Take 5 mLs by mouth every 4 (four) hours as needed. 11/21/19   [provider]  famotidine (PEPCID) 20 MG tablet Take 20 mg by mouth daily. 02/17/20   [provider]  Fluticasone-Salmeterol (ADVAIR) 250-50 MCG/DOSE AEPB Inhale 1 puff into the lungs 2 (two) times daily. 09/20/15   Hillary Bow, MD  furosemide (LASIX) 20 MG tablet Take 20 mg by mouth daily. 11/21/19   [provider]  glucose blood test strip Use once daily. Use as instructed. DX: E11.9 10/08/16   [provider]  levalbuterol Penne Lash) 0.31 MG/3ML nebulizer solution Take 1 ampule by nebulization every 6 (six) hours as needed. 12/22/19 02/05/21  [provider]  metFORMIN (GLUCOPHAGE) 1000 MG tablet Take 1 tablet (1,000 mg total) by mouth 2 (two) times daily with a meal. 02/09/21 05/10/21  Dahal, Marlowe Aschoff, MD  metoprolol tartrate (LOPRESSOR) 50 MG tablet Take 50 mg by mouth 2 (two) times daily. 11/21/19   [provider]  montelukast (SINGULAIR) 10 MG tablet Take 10 mg by mouth at bedtime. 01/09/21   [provider]  nitroGLYCERIN (NITROSTAT) 0.4 MG SL tablet Place 0.4 mg under the tongue every 5 (five) minutes x 3 doses as needed for chest pain.     [provider]  polyethylene glycol (MIRALAX / GLYCOLAX) 17 g packet Take 17 g by mouth daily.    [provider]  predniSONE (DELTASONE) 10 MG tablet Take 4 tablets daily X 2 days, then, Take 3 tablets daily X 2 days, then, Take 2 tablets daily X 2 days, then, Take 1 tablets daily X 1 day. 02/09/21   Terrilee Croak, MD  rosuvastatin (CRESTOR) 40 MG tablet Take 40 mg by mouth daily.  08/05/19   [provider]    Allergies Patient has no known allergies.  Family History  Problem Relation Age of Onset   Cancer Mother    Heart attack Father    Cancer Sister     Social History Social History   Tobacco Use   Smoking status: Former    Packs/day: 1.00    Years: 52.00    Pack years: 52.00    Types: Cigarettes    Quit date: 06/18/2016    Years since quitting: 4.7   Smokeless tobacco: Never  Vaping Use   Vaping Use: Never used  Substance Use Topics   Alcohol use: Yes    Alcohol/week: 4.0 standard drinks    Types: 4 Shots of liquor per week    Comment: has rum every other day   Drug use: No    Review of Systems Constitutional: No fever.  Endorses chills Eyes: No visual changes. ENT: No  sore throat. Cardiovascular: Denies chest pain. Respiratory: Endorses shortness of breath. Gastrointestinal: No abdominal pain.  No nausea, no vomiting.  No diarrhea. Genitourinary: Negative for dysuria. Musculoskeletal: Negative for acute arthralgias Skin: Negative for rash. Neurological: Negative for headaches, weakness/numbness/paresthesias in any extremity Psychiatric: Negative for suicidal ideation/homicidal ideation ____________________________________________  PHYSICAL EXAM:  VITAL SIGNS: ED Triage Vitals  Enc Vitals Group     BP 03/01/21 1520 100/70     Pulse Rate 03/01/21 1520 81     Resp 03/01/21 1520 20     Temp 03/01/21 1520 (!) 97.5 F (36.4 C)     Temp Source 03/01/21 1520 Oral     SpO2 03/01/21 1520 97 %     Weight 03/01/21 1424 144 lb (65.3 kg)     Height 03/01/21 1424 5\' 7"  (1.702 m)     Head Circumference --      Peak Flow --      Pain Score 03/01/21 1424 8      Pain Loc --      Pain Edu? --      Excl. in [place]? --    Constitutional: Alert and oriented. Well appearing and in no acute distress. Eyes: Conjunctivae are normal. PERRL. Head: Atraumatic. Nose: No congestion/rhinnorhea. Mouth/Throat: Mucous membranes are moist. Neck: No stridor Cardiovascular: Grossly normal heart sounds.  Good peripheral circulation. Respiratory: 4 L nasal cannula in place.  Rales appreciated to bilateral lower lung fields.  Normal respiratory effort.  No retractions. Gastrointestinal: Soft and nontender. No distention. Musculoskeletal: No obvious deformities Neurologic:  Normal speech and language. No gross focal neurologic deficits are appreciated. Skin:  Skin is warm and dry. No rash noted. Psychiatric: Mood and affect are normal. Speech and behavior are normal. ____________________________________________   LABS (all labs ordered are listed, but only abnormal results are displayed)  Labs Reviewed  BASIC METABOLIC PANEL - Abnormal; Notable for the following components:      Result Value   Glucose, Bld 163 (*)    BUN 28 (*)    All other components within normal limits  CBC - Abnormal; Notable for the following components:   WBC 14.6 (*)    RDW 17.5 (*)    All other components within normal limits  LACTIC ACID, PLASMA - Abnormal; Notable for the following components:   Lactic Acid, Venous 3.7 (*)    All other components within normal limits  CULTURE, BLOOD (ROUTINE X 2)  CULTURE, BLOOD (ROUTINE X 2)  RESP PANEL BY RT-PCR (FLU A&B, COVID) ARPGX2  LACTIC ACID, PLASMA  PROTIME-INR  APTT  URINALYSIS, COMPLETE (UACMP) WITH MICROSCOPIC  PROCALCITONIN  BRAIN NATRIURETIC PEPTIDE  TROPONIN I (HIGH SENSITIVITY)  TROPONIN I (HIGH SENSITIVITY)   ____________________________________________  EKG  ED ECG REPORT I, Naaman Plummer, the attending physician, personally viewed and interpreted this ECG.  Date: 03/01/2021 EKG Time: 1436 Rate: 78 Rhythm: normal  sinus rhythm QRS Axis: normal Intervals: RBBB ST/T Wave abnormalities: normal Narrative Interpretation: RBBB.  No evidence of acute ischemia  ____________________________________________  RADIOLOGY  ED MD interpretation: 2 view chest x-ray shows chronic emphysematous and bronchitic changes in the lungs with superimposed infiltrates in the lung bases and left midlung concerning for multifocal pneumonia  Official radiology report(s): DG Chest 2 View  Result Date: 03/01/2021 CLINICAL DATA:  Shortness of breath and pneumonia. EXAM: CHEST - 2 VIEW COMPARISON:  02/05/2021 FINDINGS: Heart size and pulmonary vascularity are normal. Emphysematous changes and fibrosis in the lungs. Superimposed infiltration in the left mid lung  and both lung bases. Changes likely represent multifocal pneumonia. Small right pleural effusion. Similar appearance to previous study, allowing for differences in technique. Surgical clips in the right axilla. Calcification of the aorta. IMPRESSION: Chronic emphysematous and bronchitic changes in the lungs. Superimposed infiltrates in the lung bases and left mid lung likely representing multifocal pneumonia. Small right pleural effusion. Electronically Signed   By: Lucienne Capers M.D.   On: 03/01/2021 15:07    ____________________________________________   PROCEDURES  Procedure(s) performed (including Critical Care):  .1-3 Lead EKG Interpretation Performed by: Naaman Plummer, MD Authorized by: Naaman Plummer, MD     Interpretation: normal     ECG rate:  80   ECG rate assessment: normal     Rhythm: sinus rhythm     Ectopy: none     Conduction: normal   Comments:     RBBB  ____________________________________________   INITIAL IMPRESSION / ASSESSMENT AND PLAN / ED COURSE  As part of my medical decision making, I reviewed the following data within the electronic medical record, if available:  Nursing notes reviewed and incorporated, Labs reviewed, EKG  interpreted, Old chart reviewed, Radiograph reviewed and Notes from prior ED visits reviewed and incorporated      Presents with shortness of breath, cough, and malaise concerning for pneumonia.  DDx: PE, Pneumothorax, TB, Atypical ACS, Esophageal Rupture, Toxic Exposure, Foreign Body Airway Obstruction.  Workup: CXR CBC, CMP, lactate, troponin  Given History, Exam, and Workup presentation most consistent with pneumonia.  Findings: Multifocal pneumonia on chest x-ray  Tx: Ceftriaxone 1g IV Azithromycin 500mg  IV  1621 Reassessment: As patient is continuing to require increased supplemental oxygenation for acute hypoxic respiratory failure, patient will require admission to the internal medicine service for further evaluation and management  Disposition: Admit     ____________________________________________   FINAL CLINICAL IMPRESSION(S) / ED DIAGNOSES  Final diagnoses:  Multifocal pneumonia  Acute respiratory failure with hypoxia [facility])     ED Discharge Orders     None        Note:  This document was prepared using Dragon voice recognition software and may include unintentional dictation errors.    Naaman Plummer, MD 03/01/21 [phone]

## 2021-03-01 NOTE — Consult Note (Signed)
CODE SEPSIS - PHARMACY COMMUNICATION  **Broad Spectrum Antibiotics should be administered within 1 hour of Sepsis diagnosis**  Time Code Sepsis Called/Page Received: 1601  Antibiotics Ordered: ceftriaxone and azithromycin  Time of 1st antibiotic administration: 1641  Additional action taken by pharmacy: none  If necessary, Name of Provider/Nurse Contacted: n/a    Cyrstal Leitz Rodriguez-Guzman PharmD, BCPS 03/01/2021 4:09 PM

## 2021-03-01 NOTE — ED Triage Notes (Signed)
Pt comes into the ED via POV c/o pneumonia.  Pt had chest xray completed yesterday for [facility] and they determined he had pneumonia.  Pt has been on abx but the MD wanted him sent for IV abx.  Pt presents with labored breathing and wearing his home O2.  Hx of COPD and he normally wears 2L [place], but he has had to self-bump himself to 3L at home.

## 2021-03-01 NOTE — H&P (Signed)
History and Physical    Bradley [name] HCW:[id number] DOB: [birth year] DOA: 03/01/2021  PCP: Valera Castle, MD (Confirm with patient/family/NH records and if not entered, this has to be entered at [facility] point of entry) Patient coming from: home  I have personally briefly reviewed patient's old medical records in [place]  Chief Complaint: SOB  HPI: Bradley [name] is a 70 y.o. male with medical history significant of DM2, HTN, HLD, CAD, history of smoking, severe COPD, GERD, chronic alcohol use about 3-4 beers per day.Patient admitted 12/5-12/9/22 for CAP with sepsis: tachycardic, tachypneic, leukocytosis, elevated procalcitonin. He had a good response to IV abx and prednisone. He was discharged on levofloxicin. At f/u 02/13/21 he was switched to azithromycin. For persistent SOB he was restarted on Levofloxicin and prednisone taper 02/27/21. He now presents for continued SOB and feeling sick.   ED Course: T 97.5, 126/67  99 RR 20, VBG 7.42/38/138. Procalcitonin <0.10. WBC 14.6 with 62/22/9. CXR with emphsema and multi-focal infiltrates. IN ED he was started on Rocephin and azithromycin. TRH called to admit for continued treatment.   Review of Systems: As per HPI otherwise 10 point review of systems negative.    Past Medical History:  Diagnosis Date   COPD (chronic obstructive pulmonary disease) ([place])    Coronary artery disease    Diabetes mellitus without complication ([place])    Hypertension     Past Surgical History:  Procedure Laterality Date   CAROTID STENT Left    LEFT HEART CATH AND CORONARY ANGIOGRAPHY Left 11/02/2019   Procedure: LEFT HEART CATH AND CORONARY ANGIOGRAPHY;  Surgeon: Teodoro Spray, MD;  Location: [place] [facility];  Service: Cardiovascular;  Laterality: Left;   LUNG BIOPSY Right    2006   right side partial lobe removed        reports that he quit smoking about 4 years ago. His smoking use included cigarettes. He has a 52.00 pack-year smoking  history. He has never used smokeless tobacco. He reports current alcohol use of about 4.0 standard drinks per week. He reports that he does not use drugs.  No Known Allergies  Family History  Problem Relation Age of Onset   Cancer Mother    Heart attack Father    Cancer Sister      Prior to Admission medications   Medication Sig Start Date End Date Taking? Authorizing Provider  Albuterol Sulfate (PROAIR RESPICLICK) 176 (90 Base) MCG/ACT AEPB INHALE 1 PUFF BY MOUTH EVERY 4 HOURS AS NEEDED FOR WHEEZING 04/13/20   [provider]  aspirin EC 81 MG tablet Take 81 mg by mouth every evening.     [provider]  dextromethorphan-guaiFENesin (ROBITUSSIN-DM) 10-100 MG/5ML liquid Take 5 mLs by mouth every 4 (four) hours as needed. 11/21/19   [provider]  famotidine (PEPCID) 20 MG tablet Take 20 mg by mouth daily. 02/17/20   [provider]  Fluticasone-Salmeterol (ADVAIR) 250-50 MCG/DOSE AEPB Inhale 1 puff into the lungs 2 (two) times daily. 09/20/15   Hillary Bow, MD  furosemide (LASIX) 20 MG tablet Take 20 mg by mouth daily. 11/21/19   [provider]  glucose blood test strip Use once daily. Use as instructed. DX: E11.9 10/08/16   [provider]  levalbuterol Penne Lash) 0.31 MG/3ML nebulizer solution Take 1 ampule by nebulization every 6 (six) hours as needed. 12/22/19 02/05/21  [provider]  metFORMIN (GLUCOPHAGE) 1000 MG tablet Take 1 tablet (1,000 mg total) by mouth 2 (two)  times daily with a meal. 02/09/21 05/10/21  Dahal, Marlowe Aschoff, MD  metoprolol tartrate (LOPRESSOR) 50 MG tablet Take 50 mg by mouth 2 (two) times daily. 11/21/19   [provider]  montelukast (SINGULAIR) 10 MG tablet Take 10 mg by mouth at bedtime. 01/09/21   [provider]  nitroGLYCERIN (NITROSTAT) 0.4 MG SL tablet Place 0.4 mg under the tongue every 5 (five) minutes x 3 doses as needed for chest pain.     [provider]  polyethylene  glycol (MIRALAX / GLYCOLAX) 17 g packet Take 17 g by mouth daily.    [provider]  predniSONE (DELTASONE) 10 MG tablet Take 4 tablets daily X 2 days, then, Take 3 tablets daily X 2 days, then, Take 2 tablets daily X 2 days, then, Take 1 tablets daily X 1 day. 02/09/21   Terrilee Croak, MD  rosuvastatin (CRESTOR) 40 MG tablet Take 40 mg by mouth daily.  08/05/19   [provider]    Physical Exam: Vitals:   03/01/21 1700 03/01/21 1730 03/01/21 1800 03/01/21 1830  BP: (!) 118/59 (!) 141/94 122/67 115/65  Pulse: 93 95 99 99  Resp: (!) 24 20 20 20   Temp:      TempSrc:      SpO2: 99% 98% 97% 97%  Weight:      Height:         Vitals:   03/01/21 1700 03/01/21 1730 03/01/21 1800 03/01/21 1830  BP: (!) 118/59 (!) 141/94 122/67 115/65  Pulse: 93 95 99 99  Resp: (!) 24 20 20 20   Temp:      TempSrc:      SpO2: 99% 98% 97% 97%  Weight:      Height:       General: WNWD man in no distress Eyes: PERRL, lids and conjunctivae normal ENMT: Mucous membranes are moist. Posterior pharynx clear of any exudate or lesions. Neck: normal, supple, no masses, no thyromegaly Respiratory: clear to auscultation bilaterally, no wheezing, no crackles. Normal respiratory effort. No accessory muscle use.  Cardiovascular: Regular rate and rhythm, no murmurs / rubs / gallops. No extremity edema. 2+ pedal pulses. No carotid bruits.  Abdomen: no tenderness, no masses palpated. No hepatosplenomegaly. Bowel sounds positive.  Musculoskeletal: no clubbing / cyanosis. No joint deformity upper and lower extremities. Good ROM, no contractures. Normal muscle tone.  Skin: no rashes, lesions, ulcers. No induration Neurologic: CN 2-12 grossly intact. Sensation intact, DTR normal. Strength 5/5 in all 4.  Psychiatric: Normal judgment and insight. Alert and oriented x 3. Normal mood.     Labs on Admission: I have personally reviewed following labs and imaging studies  CBC: Recent Labs  Lab 03/01/21 1427   WBC 14.6*  HGB 14.5  HCT 46.5  MCV 86.4  PLT 323   Basic Metabolic Panel: Recent Labs  Lab 03/01/21 1427  NA 138  K 4.2  CL 106  CO2 22  GLUCOSE 163*  BUN 28*  CREATININE 1.10  CALCIUM 9.5   GFR: Estimated Creatinine Clearance: 56.9 mL/min (by C-G formula based on SCr of 1.1 mg/dL). Liver Function Tests: No results for input(s): AST, ALT, ALKPHOS, BILITOT, PROT, ALBUMIN in the last 168 hours. No results for input(s): LIPASE, AMYLASE in the last 168 hours. No results for input(s): AMMONIA in the last 168 hours. Coagulation Profile: Recent Labs  Lab 03/01/21 1605  INR 1.0   Cardiac Enzymes: No results for input(s): CKTOTAL, CKMB, CKMBINDEX, TROPONINI in the last 168 hours. BNP (last  3 results) No results for input(s): PROBNP in the last 8760 hours. HbA1C: No results for input(s): HGBA1C in the last 72 hours. CBG: No results for input(s): GLUCAP in the last 168 hours. Lipid Profile: No results for input(s): CHOL, HDL, LDLCALC, TRIG, CHOLHDL, LDLDIRECT in the last 72 hours. Thyroid Function Tests: No results for input(s): TSH, T4TOTAL, FREET4, T3FREE, THYROIDAB in the last 72 hours. Anemia Panel: No results for input(s): VITAMINB12, FOLATE, FERRITIN, TIBC, IRON, RETICCTPCT in the last 72 hours. Urine analysis:    Component Value Date/Time   COLORURINE YELLOW (A) 02/05/2021 2155   APPEARANCEUR HAZY (A) 02/05/2021 2155   LABSPEC 1.043 (H) 02/05/2021 2155   PHURINE 5.0 02/05/2021 2155   GLUCOSEU >=500 (A) 02/05/2021 2155   HGBUR SMALL (A) 02/05/2021 2155   BILIRUBINUR NEGATIVE 02/05/2021 2155   KETONESUR 20 (A) 02/05/2021 2155   PROTEINUR 100 (A) 02/05/2021 2155   NITRITE NEGATIVE 02/05/2021 2155   LEUKOCYTESUR NEGATIVE 02/05/2021 2155    Radiological Exams on Admission: DG Chest 2 View  Result Date: 03/01/2021 CLINICAL DATA:  Shortness of breath and pneumonia. EXAM: CHEST - 2 VIEW COMPARISON:  02/05/2021 FINDINGS: Heart size and pulmonary vascularity are  normal. Emphysematous changes and fibrosis in the lungs. Superimposed infiltration in the left mid lung and both lung bases. Changes likely represent multifocal pneumonia. Small right pleural effusion. Similar appearance to previous study, allowing for differences in technique. Surgical clips in the right axilla. Calcification of the aorta. IMPRESSION: Chronic emphysematous and bronchitic changes in the lungs. Superimposed infiltrates in the lung bases and left mid lung likely representing multifocal pneumonia. Small right pleural effusion. Electronically Signed   By: Lucienne Capers M.D.   On: 03/01/2021 15:07    EKG: Independently reviewed. NSR, RBBB  Assessment/Plan Principal Problem:   Atypical pneumonia Active Problems:   Adjustment disorder with mixed anxiety and depressed mood   Type 2 diabetes mellitus with vascular disease (HCC)   CVD (cardiovascular disease)   Hypertension associated with diabetes ([place])    Atypical pneumona- patient with recent admission for CAP with a good response to tx: improved breathing, procalcitonin went down. Since discharge he has had azithromycin. He also had f/u PCP with f/u CXR that was abnormal and repeat course of Levofloxicin and prednisone initiated. He denies having recurrent fever, denies cough or sputum production, denies increased wheezing. He does have persistent SOB. Evaluation reveals patient to be afebrile, leukocytosis is mild and may be 2/2 steroid taper and differential is normal. CXR today looks better that CXR from 02/05/21. In ED he was given Rocephin and Azithromycin. Overall weak evidence for persistent bacterial pneumonia. Plan Obs admit  F/u CBCD, f/u Procalcitonin  Pulmonary consult request to Dr. Vella Kohler to clear for discharge.  2. DM stable-continue home meds  3. HTN - stable continue home meds.  DVT prophylaxis: lovenox  Code Status: full code  Family Communication: left message for Ola Spurr - daughter  Disposition Plan:  home 24-48 hrs  Consults called: Pulmonary - left secure chat message for Wallene Huh, patient's pulmonologist requesting consult in AM  Admission status: obs    Adella Hare MD [facility] Pager [phone]  If 7PM-7AM, please contact night-coverage [email or web address] Password [facility]  03/01/2021, 6:51 PM

## 2021-03-01 NOTE — ED Notes (Signed)
Pt provided a sandwich box and gingerale.

## 2021-03-01 NOTE — ED Notes (Signed)
MD Tamala Julian informed of lactic result of 3.7

## 2021-03-01 NOTE — ED Notes (Signed)
Pt's vitals reassed at this time. This RN informed that pt's BP was low and pt was stating his was having trouble breathing and got dizzy. Pt became diaphoretic and pale. Pt taken to 1hall at this time.

## 2021-03-02 ENCOUNTER — Encounter: Payer: Self-pay | Admitting: Internal Medicine

## 2021-03-02 DIAGNOSIS — Z87891 Personal history of nicotine dependence: Secondary | ICD-10-CM | POA: Diagnosis not present

## 2021-03-02 DIAGNOSIS — F4323 Adjustment disorder with mixed anxiety and depressed mood: Secondary | ICD-10-CM | POA: Diagnosis present

## 2021-03-02 DIAGNOSIS — E1159 Type 2 diabetes mellitus with other circulatory complications: Secondary | ICD-10-CM | POA: Diagnosis not present

## 2021-03-02 DIAGNOSIS — J9621 Acute and chronic respiratory failure with hypoxia: Secondary | ICD-10-CM | POA: Diagnosis present

## 2021-03-02 DIAGNOSIS — I152 Hypertension secondary to endocrine disorders: Secondary | ICD-10-CM | POA: Diagnosis present

## 2021-03-02 DIAGNOSIS — J9601 Acute respiratory failure with hypoxia: Secondary | ICD-10-CM | POA: Diagnosis not present

## 2021-03-02 DIAGNOSIS — E785 Hyperlipidemia, unspecified: Secondary | ICD-10-CM | POA: Diagnosis present

## 2021-03-02 DIAGNOSIS — J189 Pneumonia, unspecified organism: Secondary | ICD-10-CM | POA: Diagnosis present

## 2021-03-02 DIAGNOSIS — Z79899 Other long term (current) drug therapy: Secondary | ICD-10-CM | POA: Diagnosis not present

## 2021-03-02 DIAGNOSIS — A419 Sepsis, unspecified organism: Secondary | ICD-10-CM | POA: Diagnosis present

## 2021-03-02 DIAGNOSIS — J440 Chronic obstructive pulmonary disease with acute lower respiratory infection: Secondary | ICD-10-CM | POA: Diagnosis present

## 2021-03-02 DIAGNOSIS — I2510 Atherosclerotic heart disease of native coronary artery without angina pectoris: Secondary | ICD-10-CM | POA: Diagnosis present

## 2021-03-02 DIAGNOSIS — J159 Unspecified bacterial pneumonia: Secondary | ICD-10-CM | POA: Diagnosis present

## 2021-03-02 DIAGNOSIS — Z7982 Long term (current) use of aspirin: Secondary | ICD-10-CM | POA: Diagnosis not present

## 2021-03-02 DIAGNOSIS — K219 Gastro-esophageal reflux disease without esophagitis: Secondary | ICD-10-CM | POA: Diagnosis present

## 2021-03-02 DIAGNOSIS — E872 Acidosis, unspecified: Secondary | ICD-10-CM | POA: Diagnosis present

## 2021-03-02 DIAGNOSIS — E876 Hypokalemia: Secondary | ICD-10-CM | POA: Diagnosis present

## 2021-03-02 DIAGNOSIS — Z20822 Contact with and (suspected) exposure to covid-19: Secondary | ICD-10-CM | POA: Diagnosis present

## 2021-03-02 DIAGNOSIS — Z7984 Long term (current) use of oral hypoglycemic drugs: Secondary | ICD-10-CM | POA: Diagnosis not present

## 2021-03-02 DIAGNOSIS — Z8249 Family history of ischemic heart disease and other diseases of the circulatory system: Secondary | ICD-10-CM | POA: Diagnosis not present

## 2021-03-02 DIAGNOSIS — E1165 Type 2 diabetes mellitus with hyperglycemia: Secondary | ICD-10-CM | POA: Diagnosis present

## 2021-03-02 DIAGNOSIS — R6520 Severe sepsis without septic shock: Secondary | ICD-10-CM | POA: Diagnosis present

## 2021-03-02 DIAGNOSIS — Z8616 Personal history of COVID-19: Secondary | ICD-10-CM | POA: Diagnosis not present

## 2021-03-02 DIAGNOSIS — F1020 Alcohol dependence, uncomplicated: Secondary | ICD-10-CM | POA: Diagnosis present

## 2021-03-02 DIAGNOSIS — E1169 Type 2 diabetes mellitus with other specified complication: Secondary | ICD-10-CM | POA: Diagnosis present

## 2021-03-02 DIAGNOSIS — E1151 Type 2 diabetes mellitus with diabetic peripheral angiopathy without gangrene: Secondary | ICD-10-CM | POA: Diagnosis present

## 2021-03-02 LAB — BASIC METABOLIC PANEL
Anion gap: 7 (ref 5–15)
BUN: 21 mg/dL (ref 8–23)
CO2: 24 mmol/L (ref 22–32)
Calcium: 8.7 mg/dL — ABNORMAL LOW (ref 8.9–10.3)
Chloride: 105 mmol/L (ref 98–111)
Creatinine, Ser: 0.99 mg/dL (ref 0.61–1.24)
GFR, Estimated: >60 mL/min (ref >60)
Glucose, Bld: 167 mg/dL — ABNORMAL HIGH (ref 70–99)
Potassium: 3.4 mmol/L — ABNORMAL LOW (ref 3.5–5.1)
Sodium: 136 mmol/L (ref 135–145)

## 2021-03-02 LAB — CBC
HCT: 37.9 % — ABNORMAL LOW (ref 39.0–52.0)
Hemoglobin: 12.2 g/dL — ABNORMAL LOW (ref 13.0–17.0)
MCH: 26.9 pg (ref 26.0–34.0)
MCHC: 32.2 g/dL (ref 30.0–36.0)
MCV: 83.7 fL (ref 80.0–100.0)
Platelets: 213 10*3/uL (ref 150–400)
RBC: 4.53 MIL/uL (ref 4.22–5.81)
RDW: 17.4 % — ABNORMAL HIGH (ref 11.5–15.5)
WBC: 10.9 10*3/uL — ABNORMAL HIGH (ref 4.0–10.5)
nRBC: 0 % (ref 0.0–0.2)

## 2021-03-02 MED ORDER — LOPERAMIDE HCL 2 MG PO CAPS
2.0000 mg | ORAL_CAPSULE | Freq: Four times a day (QID) | ORAL | Status: DC | PRN
  Administered 2021-03-02: 18:00:00 2 mg via ORAL
  Filled 2021-03-02: qty 1

## 2021-03-02 MED ORDER — POTASSIUM CHLORIDE CRYS ER 20 MEQ PO TBCR
40.0000 meq | EXTENDED_RELEASE_TABLET | Freq: Once | ORAL | Status: AC
  Administered 2021-03-02: 16:00:00 40 meq via ORAL
  Filled 2021-03-02: qty 2

## 2021-03-02 NOTE — Progress Notes (Addendum)
Progress Note    Bradley [name]  XIP:[id number] DOB: [birth year]  DOA: 03/01/2021 PCP: Valera Castle, MD      Brief Narrative:    Medical records reviewed and are as summarized below:  Bradley [name] is a 70 y.o. male with medical history significant for recent hospitalization for sepsis from pneumonia from 02/05/2021 through 02/09/2021, type II DM, hypertension, hyperlipidemia, CAD, severe COPD, GERD, alcohol use disorder, history of tobacco use disorder (quit smoking 2017).  He presented to the hospital with cough, shortness of breath and wheezing. He said he had to go up from 2 L to 3 L/min oxygen to maintain oxygen saturation above 90s.     Assessment/Plan:   Principal Problem:   Atypical pneumonia Active Problems:   Adjustment disorder with mixed anxiety and depressed mood   Type 2 diabetes mellitus with vascular disease (HCC)   CVD (cardiovascular disease)   Hypertension associated with diabetes ([place])   Multifocal pneumonia   Body mass index is 22.55 kg/m.   Severe sepsis secondary to multifocal pneumonia: Lactic acidosis 3.7, respiratory rate 24, WBC 14.6.  Continue empiric IV antibiotics.  Follow sputum and blood cultures.  Acute hypoxemic respiratory failure: Oxygen dropped to 79%.  He is on 4 L/min oxygen via nasal cannula.  He uses 2 L/min oxygen at baseline.  Hypokalemia: Replete potassium  COPD: Continue bronchodilators  Type II DM: NovoLog as needed for hyperglycemia  Hypertension: Continue metoprolol   Diet Order             Diet heart healthy/carb modified Room service appropriate? Yes; Fluid consistency: Thin  Diet effective now                      Consultants: None  Procedures: None    Medications:    aspirin EC  81 mg Oral QPM   azithromycin  500 mg Oral Daily   enoxaparin (LOVENOX) injection  40 mg Subcutaneous Q24H   famotidine  20 mg Oral Daily   furosemide  20 mg Oral Daily   melatonin  5 mg Oral QHS    metFORMIN  1,000 mg Oral BID WC   metoprolol tartrate  50 mg Oral BID   montelukast  10 mg Oral QHS   polyethylene glycol  17 g Oral Daily   rosuvastatin  40 mg Oral Daily   sodium chloride flush  3 mL Intravenous Q12H   Continuous Infusions:  sodium chloride     cefTRIAXone (ROCEPHIN)  IV       Anti-infectives (From admission, onward)    Start     Dose/Rate Route Frequency Ordered Stop   03/02/21 1600  cefTRIAXone (ROCEPHIN) 2 g in sodium chloride 0.9 % 100 mL IVPB        2 g 200 mL/hr over 30 Minutes Intravenous Every 24 hours 03/01/21 1850 03/06/21 1559   03/02/21 1000  azithromycin (ZITHROMAX) tablet 500 mg        500 mg Oral Daily 03/01/21 1850 03/06/21 0959   03/01/21 1615  cefTRIAXone (ROCEPHIN) 2 g in sodium chloride 0.9 % 100 mL IVPB  Status:  Discontinued        2 g 200 mL/hr over 30 Minutes Intravenous Every 24 hours 03/01/21 1601 03/01/21 1850   03/01/21 1615  azithromycin (ZITHROMAX) 500 mg in sodium chloride 0.9 % 250 mL IVPB  Status:  Discontinued        500 mg 250 mL/hr over 60 Minutes Intravenous  Every 24 hours 03/01/21 1601 03/01/21 1850              Family Communication/Anticipated D/C date and plan/Code Status   DVT prophylaxis: enoxaparin (LOVENOX) injection 40 mg Start: 03/01/21 2200     Code Status: Full Code  Family Communication: None Disposition Plan: Possible discharge to home in 1 to 2 days   Status is: Inpatient  Remains inpatient appropriate because: IV antibiotics           Subjective:   C/o cough, wheezing and shortness of breath.  Objective:    Vitals:   03/02/21 0600 03/02/21 0730 03/02/21 1000 03/02/21 1220  BP: 118/67 (!) 104/59 126/68 122/77  Pulse: 72 68 86 74  Resp: (!) 22 (!) 22 20   Temp:  97.7 F (36.5 C)  97.8 F (36.6 C)  TempSrc:  Oral  Oral  SpO2: 93% 95% 94% 95%  Weight:      Height:       No data found.   Intake/Output Summary (Last 24 hours) at 03/02/2021 1328 Last data filed at  03/02/2021 1016 Gross per 24 hour  Intake 2373.43 ml  Output 2300 ml  Net 73.43 ml   Filed Weights   03/01/21 1424  Weight: 65.3 kg    Exam:  GEN: NAD SKIN: No rash EYES: EOMI ENT: MMM CV: RRR PULM: Decreased air entry bilaterally, bilateral expiratory wheezing ABD: soft, ND, NT, +BS CNS: AAO x 3, non focal EXT: No edema or tenderness        Data Reviewed:   I have personally reviewed following labs and imaging studies:  Labs: Labs show the following:   Basic Metabolic Panel: Recent Labs  Lab 03/01/21 1427 03/02/21 0713  NA 138 136  K 4.2 3.4*  CL 106 105  CO2 22 24  GLUCOSE 163* 167*  BUN 28* 21  CREATININE 1.10 0.99  CALCIUM 9.5 8.7*   GFR Estimated Creatinine Clearance: 63.2 mL/min (by C-G formula based on SCr of 0.99 mg/dL). Liver Function Tests: No results for input(s): AST, ALT, ALKPHOS, BILITOT, PROT, ALBUMIN in the last 168 hours. No results for input(s): LIPASE, AMYLASE in the last 168 hours. No results for input(s): AMMONIA in the last 168 hours. Coagulation profile Recent Labs  Lab 03/01/21 1605  INR 1.0    CBC: Recent Labs  Lab 03/01/21 1427 03/02/21 0713  WBC 14.6* 10.9*  HGB 14.5 12.2*  HCT 46.5 37.9*  MCV 86.4 83.7  PLT 265 213   Cardiac Enzymes: No results for input(s): CKTOTAL, CKMB, CKMBINDEX, TROPONINI in the last 168 hours. BNP (last 3 results) No results for input(s): PROBNP in the last 8760 hours. CBG: No results for input(s): GLUCAP in the last 168 hours. D-Dimer: No results for input(s): DDIMER in the last 72 hours. Hgb A1c: No results for input(s): HGBA1C in the last 72 hours. Lipid Profile: No results for input(s): CHOL, HDL, LDLCALC, TRIG, CHOLHDL, LDLDIRECT in the last 72 hours. Thyroid function studies: No results for input(s): TSH, T4TOTAL, T3FREE, THYROIDAB in the last 72 hours.  Invalid input(s): FREET3 Anemia work up: No results for input(s): VITAMINB12, FOLATE, FERRITIN, TIBC, IRON, RETICCTPCT  in the last 72 hours. Sepsis Labs: Recent Labs  Lab 03/01/21 1427 03/01/21 1605 03/01/21 1645 03/02/21 0713  PROCALCITON  --  <0.10  --   --   WBC 14.6*  --   --  10.9*  LATICACIDVEN 3.7*  --  2.3*  --     Microbiology Recent Results (  from the past 240 hour(s))  Blood Culture (routine x 2)     Status: None (Preliminary result)   Collection Time: 03/01/21  4:05 PM   Specimen: BLOOD  Result Value Ref Range Status   Specimen Description BLOOD BLOOD LEFT WRIST  Final   Special Requests   Final    BOTTLES DRAWN AEROBIC ONLY Blood Culture adequate volume   Culture   Final    NO GROWTH < 24 HOURS Performed at [facility], [address] [place], [place] [address]    Report Status PENDING  Incomplete  Blood Culture (routine x 2)     Status: None (Preliminary result)   Collection Time: 03/01/21  4:05 PM   Specimen: BLOOD  Result Value Ref Range Status   Specimen Description BLOOD BLOOD LEFT FOREARM  Final   Special Requests   Final    BOTTLES DRAWN AEROBIC ONLY Blood Culture adequate volume   Culture   Final    NO GROWTH < 24 HOURS Performed at [facility], [address], [place], [place] [address]    Report Status PENDING  Incomplete  Resp Panel by RT-PCR (Flu A&B, Covid) Nasopharyngeal Swab     Status: None   Collection Time: 03/01/21  4:45 PM   Specimen: Nasopharyngeal Swab; Nasopharyngeal(NP) swabs in vial transport medium  Result Value Ref Range Status   SARS Coronavirus 2 by RT PCR NEGATIVE NEGATIVE Final    Comment: (NOTE) SARS-CoV-2 target nucleic acids are NOT DETECTED.  The SARS-CoV-2 RNA is generally detectable in upper respiratory specimens during the acute phase of infection. The lowest concentration of SARS-CoV-2 viral copies this assay can detect is 138 copies/mL. A negative result does not preclude SARS-Cov-2 infection and should not be used as the sole basis for treatment or other patient management decisions. A negative result may  occur with  improper specimen collection/handling, submission of specimen other than nasopharyngeal swab, presence of viral mutation(s) within the areas targeted by this assay, and inadequate number of viral copies(<138 copies/mL). A negative result must be combined with clinical observations, patient history, and epidemiological information. The expected result is Negative.  Fact Sheet for Patients:  EntrepreneurPulse.com.au  Fact Sheet for Healthcare Providers:  IncredibleEmployment.be  This test is no t yet approved or cleared by the [place] FDA and  has been authorized for detection and/or diagnosis of SARS-CoV-2 by FDA under an Emergency Use Authorization (EUA). This EUA will remain  in effect (meaning this test can be used) for the duration of the COVID-19 declaration under Section 564(b)(1) of the Act, 21 U.S.C.section 360bbb-3(b)(1), unless the authorization is terminated  or revoked sooner.       Influenza A by PCR NEGATIVE NEGATIVE Final   Influenza B by PCR NEGATIVE NEGATIVE Final    Comment: (NOTE) The Xpert Xpress SARS-CoV-2/FLU/RSV plus assay is intended as an aid in the diagnosis of influenza from Nasopharyngeal swab specimens and should not be used as a sole basis for treatment. Nasal washings and aspirates are unacceptable for Xpert Xpress SARS-CoV-2/FLU/RSV testing.  Fact Sheet for Patients: EntrepreneurPulse.com.au  Fact Sheet for Healthcare Providers: IncredibleEmployment.be  This test is not yet approved or cleared by the [place] FDA and has been authorized for detection and/or diagnosis of SARS-CoV-2 by FDA under an Emergency Use Authorization (EUA). This EUA will remain in effect (meaning this test can be used) for the duration of the COVID-19 declaration under Section 564(b)(1) of the Act, 21 U.S.C. section 360bbb-3(b)(1), unless the authorization  is terminated  or revoked.  Performed at [facility], [address] [place], [place] [address]   Expectorated Sputum Assessment w Gram Stain, Rflx to Resp Cult     Status: None   Collection Time: 03/01/21  5:27 PM   Specimen: Expectorated Sputum  Result Value Ref Range Status   Specimen Description EXPECTORATED SPUTUM  Final   Special Requests NONE  Final   Sputum evaluation   Final    THIS SPECIMEN IS ACCEPTABLE FOR SPUTUM CULTURE Performed at [facility], [address], [place], [place] [address]    Report Status 03/01/2021 FINAL  Final  Culture, Respiratory w Gram Stain     Status: None (Preliminary result)   Collection Time: 03/01/21  5:27 PM  Result Value Ref Range Status   Specimen Description   Final    EXPECTORATED SPUTUM Performed at [facility], [address], [place], [place] [address]    Special Requests   Final    NONE Reflexed from [id number] Performed at [facility], [address] [place], [place] [address]    Gram Stain   Final    RARE SQUAMOUS EPITHELIAL CELLS PRESENT MODERATE WBC PRESENT,BOTH PMN AND MONONUCLEAR FEW GRAM POSITIVE COCCI RARE GRAM NEGATIVE RODS RARE GRAM POSITIVE RODS    Culture   Final    NO GROWTH < 12 HOURS Performed at [facility], [place] [address] [place], [place] [address]    Report Status PENDING  Incomplete    Procedures and diagnostic studies:  DG Chest 2 View  Result Date: 03/01/2021 CLINICAL DATA:  Shortness of breath and pneumonia. EXAM: CHEST - 2 VIEW COMPARISON:  02/05/2021 FINDINGS: Heart size and pulmonary vascularity are normal. Emphysematous changes and fibrosis in the lungs. Superimposed infiltration in the left mid lung and both lung bases. Changes likely represent multifocal pneumonia. Small right pleural effusion. Similar appearance to previous study, allowing for differences in technique. Surgical clips in the right axilla. Calcification of the aorta. IMPRESSION: Chronic  emphysematous and bronchitic changes in the lungs. Superimposed infiltrates in the lung bases and left mid lung likely representing multifocal pneumonia. Small right pleural effusion. Electronically Signed   By: Lucienne Capers M.D.   On: 03/01/2021 15:07               LOS: 0 days   Chance Karam  [facility]   Pager on [email or web address]. If 7PM-7AM, please contact night-coverage at [email or web address]     03/02/2021, 1:28 PM

## 2021-03-02 NOTE — ED Notes (Signed)
Informed RN bed assigned 

## 2021-03-03 DIAGNOSIS — J189 Pneumonia, unspecified organism: Secondary | ICD-10-CM | POA: Diagnosis not present

## 2021-03-03 DIAGNOSIS — A419 Sepsis, unspecified organism: Secondary | ICD-10-CM | POA: Diagnosis present

## 2021-03-03 DIAGNOSIS — R6520 Severe sepsis without septic shock: Secondary | ICD-10-CM | POA: Diagnosis present

## 2021-03-03 LAB — BASIC METABOLIC PANEL
Anion gap: 8 (ref 5–15)
BUN: 20 mg/dL (ref 8–23)
CO2: 29 mmol/L (ref 22–32)
Calcium: 9.2 mg/dL (ref 8.9–10.3)
Chloride: 104 mmol/L (ref 98–111)
Creatinine, Ser: 1.14 mg/dL (ref 0.61–1.24)
GFR, Estimated: >60 mL/min (ref >60)
Glucose, Bld: 128 mg/dL — ABNORMAL HIGH (ref 70–99)
Potassium: 4.1 mmol/L (ref 3.5–5.1)
Sodium: 141 mmol/L (ref 135–145)

## 2021-03-03 LAB — CBC WITH DIFFERENTIAL/PLATELET
Abs Immature Granulocytes: 0.12 10*3/uL — ABNORMAL HIGH (ref 0.00–0.07)
Basophils Absolute: 0.1 10*3/uL (ref 0.0–0.1)
Basophils Relative: 1 %
Eosinophils Absolute: 0.5 10*3/uL (ref 0.0–0.5)
Eosinophils Relative: 5 %
HCT: 42.1 % (ref 39.0–52.0)
Hemoglobin: 13.2 g/dL (ref 13.0–17.0)
Immature Granulocytes: 1 %
Lymphocytes Relative: 26 %
Lymphs Abs: 2.7 10*3/uL (ref 0.7–4.0)
MCH: 26.5 pg (ref 26.0–34.0)
MCHC: 31.4 g/dL (ref 30.0–36.0)
MCV: 84.5 fL (ref 80.0–100.0)
Monocytes Absolute: 1.3 10*3/uL — ABNORMAL HIGH (ref 0.1–1.0)
Monocytes Relative: 12 %
Neutro Abs: 5.7 10*3/uL (ref 1.7–7.7)
Neutrophils Relative %: 55 %
Platelets: 229 10*3/uL (ref 150–400)
RBC: 4.98 MIL/uL (ref 4.22–5.81)
RDW: 17.2 % — ABNORMAL HIGH (ref 11.5–15.5)
WBC: 10.5 10*3/uL (ref 4.0–10.5)
nRBC: 0 % (ref 0.0–0.2)

## 2021-03-03 LAB — CULTURE, RESPIRATORY W GRAM STAIN: Culture: NORMAL

## 2021-03-03 LAB — MAGNESIUM: Magnesium: 1.9 mg/dL (ref 1.7–2.4)

## 2021-03-03 NOTE — Plan of Care (Signed)

## 2021-03-03 NOTE — Progress Notes (Addendum)
Progress Note    Bradley [name]  QAS:[id number] DOB: 08/30/1950  DOA: 03/01/2021 PCP: Valera Castle, MD      Brief Narrative:    Medical records reviewed and are as summarized below:  Bradley [name] is a 70 y.o. male with medical history significant for recent hospitalization for sepsis from pneumonia from 02/05/2021 through 02/09/2021, type II DM, hypertension, hyperlipidemia, CAD, severe COPD, GERD, alcohol use disorder, history of tobacco use disorder (quit smoking 2017).  He presented to the hospital with cough, shortness of breath and wheezing. He said he had to go up from 2 L to 3 L/min oxygen to maintain oxygen saturation above 90s.  He was admitted to the hospital for severe sepsis secondary to multifocal pneumonia complicated by acute on chronic hypoxemic respiratory failure.  He was treated with IV antibiotics and IV fluids.   Assessment/Plan:   Principal Problem:   Severe sepsis (HCC) Active Problems:   Adjustment disorder with mixed anxiety and depressed mood   Type 2 diabetes mellitus with vascular disease (HCC)   CVD (cardiovascular disease)   Hypertension associated with diabetes ([place])   Multifocal pneumonia   Body mass index is 22.55 kg/m.   Severe sepsis secondary to multifocal pneumonia: Lactic acidosis 3.7, respiratory rate 24, WBC 14.6.  Continue empiric IV antibiotics.  Sputum and blood cultures are pending.    Acute on chronic hypoxemic respiratory failure: Continue 4 L/min oxygen via nasal cannula and taper down as able.  He uses 2 L/min oxygen at baseline.  Hypokalemia: Improved  COPD: Continue bronchodilators  Type II DM: NovoLog as needed for hyperglycemia  Hypertension, CAD: Continue metoprolol   Diet Order             Diet heart healthy/carb modified Room service appropriate? Yes; Fluid consistency: Thin  Diet effective now                      Consultants: None  Procedures: None    Medications:    aspirin  EC  81 mg Oral QPM   azithromycin  500 mg Oral Daily   enoxaparin (LOVENOX) injection  40 mg Subcutaneous Q24H   famotidine  20 mg Oral Daily   furosemide  20 mg Oral Daily   melatonin  5 mg Oral QHS   metFORMIN  1,000 mg Oral BID WC   metoprolol tartrate  50 mg Oral BID   montelukast  10 mg Oral QHS   polyethylene glycol  17 g Oral Daily   rosuvastatin  40 mg Oral Daily   sodium chloride flush  3 mL Intravenous Q12H   Continuous Infusions:  sodium chloride     cefTRIAXone (ROCEPHIN)  IV Stopped (03/02/21 2214)     Anti-infectives (From admission, onward)    Start     Dose/Rate Route Frequency Ordered Stop   03/02/21 1600  cefTRIAXone (ROCEPHIN) 2 g in sodium chloride 0.9 % 100 mL IVPB        2 g 200 mL/hr over 30 Minutes Intravenous Every 24 hours 03/01/21 1850 03/06/21 1559   03/02/21 1000  azithromycin (ZITHROMAX) tablet 500 mg        500 mg Oral Daily 03/01/21 1850 03/06/21 0959   03/01/21 1615  cefTRIAXone (ROCEPHIN) 2 g in sodium chloride 0.9 % 100 mL IVPB  Status:  Discontinued        2 g 200 mL/hr over 30 Minutes Intravenous Every 24 hours 03/01/21 1601 03/01/21 1850  03/01/21 1615  azithromycin (ZITHROMAX) 500 mg in sodium chloride 0.9 % 250 mL IVPB  Status:  Discontinued        500 mg 250 mL/hr over 60 Minutes Intravenous Every 24 hours 03/01/21 1601 03/01/21 1850              Family Communication/Anticipated D/C date and plan/Code Status   DVT prophylaxis: enoxaparin (LOVENOX) injection 40 mg Start: 03/01/21 2200     Code Status: Full Code  Family Communication: None Disposition Plan: Possible discharge to home in 1 to 2 days   Status is: Inpatient  Remains inpatient appropriate because: IV antibiotics           Subjective:   C/o "I feel short-winded". He still has a cough  Objective:    Vitals:   03/02/21 1931 03/03/21 0428 03/03/21 0856 03/03/21 1125  BP: 120/65 (!) 90/50 (!) 105/55 109/61  Pulse: 89 70 87 91  Resp: 18 20   17   Temp: 98 F (36.7 C) 97.8 F (36.6 C) 97.8 F (36.6 C) 98.5 F (36.9 C)  TempSrc: Oral Oral Oral Oral  SpO2: 94% 91% 93% 94%  Weight:      Height:       No data found.   Intake/Output Summary (Last 24 hours) at 03/03/2021 1205 Last data filed at 03/03/2021 0900 Gross per 24 hour  Intake 340 ml  Output --  Net 340 ml   Filed Weights   03/01/21 1424  Weight: 65.3 kg    Exam:  GEN: NAD SKIN: Warm and dry EYES: No pallor or icterus ENT: MMM CV: RRR PULM: b/l wheezing, decreased air entry bilaterally ABD: soft, ND, NT, +BS CNS: AAO x 3, non focal EXT: No edema or tenderness         Data Reviewed:   I have personally reviewed following labs and imaging studies:  Labs: Labs show the following:   Basic Metabolic Panel: Recent Labs  Lab 03/01/21 1427 03/02/21 0713 03/03/21 0618  NA 138 136 141  K 4.2 3.4* 4.1  CL 106 105 104  CO2 22 24 29   GLUCOSE 163* 167* 128*  BUN 28* 21 20  CREATININE 1.10 0.99 1.14  CALCIUM 9.5 8.7* 9.2  MG  --   --  1.9   GFR Estimated Creatinine Clearance: 54.9 mL/min (by C-G formula based on SCr of 1.14 mg/dL). Liver Function Tests: No results for input(s): AST, ALT, ALKPHOS, BILITOT, PROT, ALBUMIN in the last 168 hours. No results for input(s): LIPASE, AMYLASE in the last 168 hours. No results for input(s): AMMONIA in the last 168 hours. Coagulation profile Recent Labs  Lab 03/01/21 1605  INR 1.0    CBC: Recent Labs  Lab 03/01/21 1427 03/02/21 0713 03/03/21 0618  WBC 14.6* 10.9* 10.5  NEUTROABS  --   --  5.7  HGB 14.5 12.2* 13.2  HCT 46.5 37.9* 42.1  MCV 86.4 83.7 84.5  PLT 265 213 229   Cardiac Enzymes: No results for input(s): CKTOTAL, CKMB, CKMBINDEX, TROPONINI in the last 168 hours. BNP (last 3 results) No results for input(s): PROBNP in the last 8760 hours. CBG: No results for input(s): GLUCAP in the last 168 hours. D-Dimer: No results for input(s): DDIMER in the last 72 hours. Hgb A1c: No  results for input(s): HGBA1C in the last 72 hours. Lipid Profile: No results for input(s): CHOL, HDL, LDLCALC, TRIG, CHOLHDL, LDLDIRECT in the last 72 hours. Thyroid function studies: No results for input(s): TSH, T4TOTAL, T3FREE, THYROIDAB  in the last 72 hours.  Invalid input(s): FREET3 Anemia work up: No results for input(s): VITAMINB12, FOLATE, FERRITIN, TIBC, IRON, RETICCTPCT in the last 72 hours. Sepsis Labs: Recent Labs  Lab 03/01/21 1427 03/01/21 1605 03/01/21 1645 03/02/21 0713 03/03/21 0618  PROCALCITON  --  <0.10  --   --   --   WBC 14.6*  --   --  10.9* 10.5  LATICACIDVEN 3.7*  --  2.3*  --   --     Microbiology Recent Results (from the past 240 hour(s))  Blood Culture (routine x 2)     Status: None (Preliminary result)   Collection Time: 03/01/21  4:05 PM   Specimen: BLOOD  Result Value Ref Range Status   Specimen Description BLOOD BLOOD LEFT WRIST  Final   Special Requests   Final    BOTTLES DRAWN AEROBIC ONLY Blood Culture adequate volume   Culture   Final    NO GROWTH 2 DAYS Performed at [facility], [address], [place], [place] [address]    Report Status PENDING  Incomplete  Blood Culture (routine x 2)     Status: None (Preliminary result)   Collection Time: 03/01/21  4:05 PM   Specimen: BLOOD  Result Value Ref Range Status   Specimen Description BLOOD BLOOD LEFT FOREARM  Final   Special Requests   Final    BOTTLES DRAWN AEROBIC ONLY Blood Culture adequate volume   Culture   Final    NO GROWTH 2 DAYS Performed at [facility], [address] [place], [place] [address]    Report Status PENDING  Incomplete  Resp Panel by RT-PCR (Flu A&B, Covid) Nasopharyngeal Swab     Status: None   Collection Time: 03/01/21  4:45 PM   Specimen: Nasopharyngeal Swab; Nasopharyngeal(NP) swabs in vial transport medium  Result Value Ref Range Status   SARS Coronavirus 2 by RT PCR NEGATIVE NEGATIVE Final    Comment: (NOTE) SARS-CoV-2 target  nucleic acids are NOT DETECTED.  The SARS-CoV-2 RNA is generally detectable in upper respiratory specimens during the acute phase of infection. The lowest concentration of SARS-CoV-2 viral copies this assay can detect is 138 copies/mL. A negative result does not preclude SARS-Cov-2 infection and should not be used as the sole basis for treatment or other patient management decisions. A negative result may occur with  improper specimen collection/handling, submission of specimen other than nasopharyngeal swab, presence of viral mutation(s) within the areas targeted by this assay, and inadequate number of viral copies(<138 copies/mL). A negative result must be combined with clinical observations, patient history, and epidemiological information. The expected result is Negative.  Fact Sheet for Patients:  EntrepreneurPulse.com.au  Fact Sheet for Healthcare Providers:  IncredibleEmployment.be  This test is no t yet approved or cleared by the [place] FDA and  has been authorized for detection and/or diagnosis of SARS-CoV-2 by FDA under an Emergency Use Authorization (EUA). This EUA will remain  in effect (meaning this test can be used) for the duration of the COVID-19 declaration under Section 564(b)(1) of the Act, 21 U.S.C.section 360bbb-3(b)(1), unless the authorization is terminated  or revoked sooner.       Influenza A by PCR NEGATIVE NEGATIVE Final   Influenza B by PCR NEGATIVE NEGATIVE Final    Comment: (NOTE) The Xpert Xpress SARS-CoV-2/FLU/RSV plus assay is intended as an aid in the diagnosis of influenza from Nasopharyngeal swab specimens and should not be used as a sole basis for treatment. Nasal washings  and aspirates are unacceptable for Xpert Xpress SARS-CoV-2/FLU/RSV testing.  Fact Sheet for Patients: EntrepreneurPulse.com.au  Fact Sheet for Healthcare  Providers: IncredibleEmployment.be  This test is not yet approved or cleared by the [place] FDA and has been authorized for detection and/or diagnosis of SARS-CoV-2 by FDA under an Emergency Use Authorization (EUA). This EUA will remain in effect (meaning this test can be used) for the duration of the COVID-19 declaration under Section 564(b)(1) of the Act, 21 U.S.C. section 360bbb-3(b)(1), unless the authorization is terminated or revoked.  Performed at [facility], [address] [place], [place] [address]   Expectorated Sputum Assessment w Gram Stain, Rflx to Resp Cult     Status: None   Collection Time: 03/01/21  5:27 PM   Specimen: Expectorated Sputum  Result Value Ref Range Status   Specimen Description EXPECTORATED SPUTUM  Final   Special Requests NONE  Final   Sputum evaluation   Final    THIS SPECIMEN IS ACCEPTABLE FOR SPUTUM CULTURE Performed at [facility], [address], [place], [place] [address]    Report Status 03/01/2021 FINAL  Final  Culture, Respiratory w Gram Stain     Status: None (Preliminary result)   Collection Time: 03/01/21  5:27 PM  Result Value Ref Range Status   Specimen Description   Final    EXPECTORATED SPUTUM Performed at [facility], [address], [place], [place] [address]    Special Requests   Final    NONE Reflexed from [id number] Performed at [facility] [address] [place], [place] [address]    Gram Stain   Final    RARE SQUAMOUS EPITHELIAL CELLS PRESENT MODERATE WBC PRESENT,BOTH PMN AND MONONUCLEAR FEW GRAM POSITIVE COCCI RARE GRAM NEGATIVE RODS RARE GRAM POSITIVE RODS    Culture   Final    NO GROWTH < 12 HOURS Performed at [facility], [place] [address], [place],  [address]    Report Status PENDING  Incomplete    Procedures and diagnostic studies:  DG Chest 2 View  Result Date: 03/01/2021 CLINICAL DATA:  Shortness of breath and pneumonia.  EXAM: CHEST - 2 VIEW COMPARISON:  02/05/2021 FINDINGS: Heart size and pulmonary vascularity are normal. Emphysematous changes and fibrosis in the lungs. Superimposed infiltration in the left mid lung and both lung bases. Changes likely represent multifocal pneumonia. Small right pleural effusion. Similar appearance to previous study, allowing for differences in technique. Surgical clips in the right axilla. Calcification of the aorta. IMPRESSION: Chronic emphysematous and bronchitic changes in the lungs. Superimposed infiltrates in the lung bases and left mid lung likely representing multifocal pneumonia. Small right pleural effusion. Electronically Signed   By: Lucienne Capers M.D.   On: 03/01/2021 15:07               LOS: 1 day   Tykeria Wawrzyniak  [facility]   Pager on [email or web address]. If 7PM-7AM, please contact night-coverage at [email or web address]     03/03/2021, 12:05 PM

## 2021-03-03 NOTE — TOC CM/SW Note (Signed)
°  Transition of Care [facility]) Screening Note   Patient Details  Name: MAKO PELFREY Date of Birth: 1950-09-19   Transition of Care [facility]) CM/SW Contact:    Magnus Ivan, LCSW Phone Number: 03/03/2021, 10:48 AM    Transition of Care Department [facility]) has reviewed patient and no TOC needs have been identified at this time. We will continue to monitor patient advancement through interdisciplinary progression rounds. If new patient transition needs arise, please place a TOC consult.

## 2021-03-04 DIAGNOSIS — J9601 Acute respiratory failure with hypoxia: Secondary | ICD-10-CM | POA: Diagnosis not present

## 2021-03-04 DIAGNOSIS — R6520 Severe sepsis without septic shock: Secondary | ICD-10-CM | POA: Diagnosis not present

## 2021-03-04 DIAGNOSIS — A419 Sepsis, unspecified organism: Secondary | ICD-10-CM | POA: Diagnosis not present

## 2021-03-04 DIAGNOSIS — J189 Pneumonia, unspecified organism: Secondary | ICD-10-CM | POA: Diagnosis not present

## 2021-03-04 LAB — GLUCOSE, CAPILLARY
Glucose-Capillary: 271 mg/dL — ABNORMAL HIGH (ref 70–99)
Glucose-Capillary: 149 mg/dL — ABNORMAL HIGH (ref 70–99)

## 2021-03-04 MED ORDER — INSULIN ASPART 100 UNIT/ML IJ SOLN
0.0000 [IU] | Freq: Three times a day (TID) | INTRAMUSCULAR | Status: DC
  Administered 2021-03-05 – 2021-03-06 (×4): 2 [IU] via SUBCUTANEOUS
  Filled 2021-03-04 (×4): qty 1

## 2021-03-04 NOTE — Plan of Care (Signed)
  Problem: Health Behavior/Discharge Planning: Goal: Ability to manage health-related needs will improve Outcome: Progressing   

## 2021-03-04 NOTE — Progress Notes (Signed)
PROGRESS NOTE    Bradley [name]  HYQ:[id number] DOB: 1950-08-20 DOA: 03/01/2021 PCP: Valera Castle, MD    Chief Complaint  Patient presents with   Pneumonia    Brief Narrative:   Bradley [name] is a 71 y.o. male with medical history significant for recent hospitalization for sepsis from pneumonia from 02/05/2021 through 02/09/2021, type II DM, hypertension, hyperlipidemia, CAD, severe COPD, GERD, alcohol use disorder, history of tobacco use disorder (quit smoking 2017).  He presented to the hospital with cough, shortness of breath and wheezing. He said he had to go up from 2 L to 3 L/min oxygen to maintain oxygen saturation above 90s.   He was admitted to the hospital for severe sepsis secondary to multifocal pneumonia complicated by acute on chronic hypoxemic respiratory failure.  He was treated with IV antibiotics and IV fluids.    Assessment & Plan:   Principal Problem:   Severe sepsis ([place]) Active Problems:   Adjustment disorder with mixed anxiety and depressed mood   Type 2 diabetes mellitus with vascular disease (HCC)   CVD (cardiovascular disease)   Hypertension associated with diabetes ([place])   Multifocal pneumonia  Severe sepsis secondary to multifocal pneumonia.  Much improved.  Resume IV antibiotics.  So far cultures are negative.  Wean his oxygen to 2 lit/min in the next 24 hours.     Hypertension:  Well controlled.    Hypokalemia Replaced.     Type 2 DM:  CBG (last 3)  Recent Labs    03/04/21 0903  GLUCAP 271*   Continue with SSI.  A!c is 8.3% Continue with metformin.    Hyperlipidemia:  Resume crestor.         DVT prophylaxis: (Lovenox/) Code Status: Full code.  Family Communication: none at bedside.  Disposition:   Status is: Inpatient  Remains inpatient appropriate because: IV antibiotics.        Consultants:  NOne.  Procedures: none.   Antimicrobials:  Antibiotics Given (last 72 hours)     Date/Time Action  Medication Dose Rate   03/02/21 1010 Given   azithromycin (ZITHROMAX) tablet 500 mg 500 mg    03/02/21 2143 New Bag/Given   cefTRIAXone (ROCEPHIN) 2 g in sodium chloride 0.9 % 100 mL IVPB 2 g 200 mL/hr   03/03/21 1051 Given   azithromycin (ZITHROMAX) tablet 500 mg 500 mg    03/03/21 1709 New Bag/Given   cefTRIAXone (ROCEPHIN) 2 g in sodium chloride 0.9 % 100 mL IVPB 2 g 200 mL/hr   03/04/21 0948 Given   azithromycin (ZITHROMAX) tablet 500 mg 500 mg    03/04/21 1702 New Bag/Given   cefTRIAXone (ROCEPHIN) 2 g in sodium chloride 0.9 % 100 mL IVPB 2 g 200 mL/hr         Subjective: Breathing has improved, but pt is not back to baseline.   Objective: Vitals:   03/04/21 0351 03/04/21 0901 03/04/21 1205 03/04/21 1623  BP: (!) 135/56 105/65 106/65 113/65  Pulse: 88 87 69 85  Resp: 18  16 17   Temp: 98 F (36.7 C) 97.9 F (36.6 C) 97.7 F (36.5 C) 98 F (36.7 C)  TempSrc: Oral Oral Oral Oral  SpO2: 96% 92% 94% 91%  Weight:      Height:        Intake/Output Summary (Last 24 hours) at 03/04/2021 1747 Last data filed at 03/04/2021 1300 Gross per 24 hour  Intake 1300 ml  Output 1975 ml  Net -675 ml   Filed  Weights   03/01/21 1424  Weight: 65.3 kg    Examination:  General exam: Appears calm and comfortable  Respiratory system: Clear to auscultation. Respiratory effort normal. Cardiovascular system: S1 & S2 heard, RRR. No JVD,  No pedal edema. Gastrointestinal system: Abdomen is nondistended, soft and nontender. No organomegaly or masses felt. Normal bowel sounds heard. Central nervous system: Alert and oriented. No focal neurological deficits. Extremities: Symmetric 5 x 5 power. Skin: No rashes, lesions or ulcers Psychiatry: Judgement and insight appear normal. Mood & affect appropriate.     Data Reviewed: I have personally reviewed following labs and imaging studies  CBC: Recent Labs  Lab 03/01/21 1427 03/02/21 0713 03/03/21 0618  WBC 14.6* 10.9* 10.5  NEUTROABS   --   --  5.7  HGB 14.5 12.2* 13.2  HCT 46.5 37.9* 42.1  MCV 86.4 83.7 84.5  PLT 265 213 417    Basic Metabolic Panel: Recent Labs  Lab 03/01/21 1427 03/02/21 0713 03/03/21 0618  NA 138 136 141  K 4.2 3.4* 4.1  CL 106 105 104  CO2 22 24 29   GLUCOSE 163* 167* 128*  BUN 28* 21 20  CREATININE 1.10 0.99 1.14  CALCIUM 9.5 8.7* 9.2  MG  --   --  1.9    GFR: Estimated Creatinine Clearance: 54.9 mL/min (by C-G formula based on SCr of 1.14 mg/dL).  Liver Function Tests: No results for input(s): AST, ALT, ALKPHOS, BILITOT, PROT, ALBUMIN in the last 168 hours.  CBG: Recent Labs  Lab 03/04/21 0903  GLUCAP 271*     Recent Results (from the past 240 hour(s))  Blood Culture (routine x 2)     Status: None (Preliminary result)   Collection Time: 03/01/21  4:05 PM   Specimen: BLOOD  Result Value Ref Range Status   Specimen Description BLOOD BLOOD LEFT WRIST  Final   Special Requests   Final    BOTTLES DRAWN AEROBIC ONLY Blood Culture adequate volume   Culture   Final    NO GROWTH 3 DAYS Performed at [facility], [address], [place], [place] [address]    Report Status PENDING  Incomplete  Blood Culture (routine x 2)     Status: None (Preliminary result)   Collection Time: 03/01/21  4:05 PM   Specimen: BLOOD  Result Value Ref Range Status   Specimen Description BLOOD BLOOD LEFT FOREARM  Final   Special Requests   Final    BOTTLES DRAWN AEROBIC ONLY Blood Culture adequate volume   Culture   Final    NO GROWTH 3 DAYS Performed at [facility], [address] [place], [place] [address]    Report Status PENDING  Incomplete  Resp Panel by RT-PCR (Flu A&B, Covid) Nasopharyngeal Swab     Status: None   Collection Time: 03/01/21  4:45 PM   Specimen: Nasopharyngeal Swab; Nasopharyngeal(NP) swabs in vial transport medium  Result Value Ref Range Status   SARS Coronavirus 2 by RT PCR NEGATIVE NEGATIVE Final    Comment: (NOTE) SARS-CoV-2 target nucleic  acids are NOT DETECTED.  The SARS-CoV-2 RNA is generally detectable in upper respiratory specimens during the acute phase of infection. The lowest concentration of SARS-CoV-2 viral copies this assay can detect is 138 copies/mL. A negative result does not preclude SARS-Cov-2 infection and should not be used as the sole basis for treatment or other patient management decisions. A negative result may occur with  improper specimen collection/handling, submission of specimen other than nasopharyngeal swab,  presence of viral mutation(s) within the areas targeted by this assay, and inadequate number of viral copies(<138 copies/mL). A negative result must be combined with clinical observations, patient history, and epidemiological information. The expected result is Negative.  Fact Sheet for Patients:  EntrepreneurPulse.com.au  Fact Sheet for Healthcare Providers:  IncredibleEmployment.be  This test is no t yet approved or cleared by the [place] FDA and  has been authorized for detection and/or diagnosis of SARS-CoV-2 by FDA under an Emergency Use Authorization (EUA). This EUA will remain  in effect (meaning this test can be used) for the duration of the COVID-19 declaration under Section 564(b)(1) of the Act, 21 U.S.C.section 360bbb-3(b)(1), unless the authorization is terminated  or revoked sooner.       Influenza A by PCR NEGATIVE NEGATIVE Final   Influenza B by PCR NEGATIVE NEGATIVE Final    Comment: (NOTE) The Xpert Xpress SARS-CoV-2/FLU/RSV plus assay is intended as an aid in the diagnosis of influenza from Nasopharyngeal swab specimens and should not be used as a sole basis for treatment. Nasal washings and aspirates are unacceptable for Xpert Xpress SARS-CoV-2/FLU/RSV testing.  Fact Sheet for Patients: EntrepreneurPulse.com.au  Fact Sheet for Healthcare Providers: IncredibleEmployment.be  This  test is not yet approved or cleared by the [place] FDA and has been authorized for detection and/or diagnosis of SARS-CoV-2 by FDA under an Emergency Use Authorization (EUA). This EUA will remain in effect (meaning this test can be used) for the duration of the COVID-19 declaration under Section 564(b)(1) of the Act, 21 U.S.C. section 360bbb-3(b)(1), unless the authorization is terminated or revoked.  Performed at [facility], [address] [place], [place] [address]   Expectorated Sputum Assessment w Gram Stain, Rflx to Resp Cult     Status: None   Collection Time: 03/01/21  5:27 PM   Specimen: Expectorated Sputum  Result Value Ref Range Status   Specimen Description EXPECTORATED SPUTUM  Final   Special Requests NONE  Final   Sputum evaluation   Final    THIS SPECIMEN IS ACCEPTABLE FOR SPUTUM CULTURE Performed at [facility], [address] [place], [place] [address]    Report Status 03/01/2021 FINAL  Final  Culture, Respiratory w Gram Stain     Status: None   Collection Time: 03/01/21  5:27 PM  Result Value Ref Range Status   Specimen Description   Final    EXPECTORATED SPUTUM Performed at [facility], [address], [place], [place] [address]    Special Requests   Final    NONE Reflexed from [id number] Performed at [facility], [place], [place] [address]    Gram Stain   Final    RARE SQUAMOUS EPITHELIAL CELLS PRESENT MODERATE WBC PRESENT,BOTH PMN AND MONONUCLEAR FEW GRAM POSITIVE COCCI RARE GRAM NEGATIVE RODS RARE GRAM POSITIVE RODS    Culture   Final    RARE Normal respiratory flora-no Staph aureus or Pseudomonas seen Performed at [facility], [place] [address], [place],  [address]    Report Status 03/03/2021 FINAL  Final         Radiology Studies: No results found.      Scheduled Meds:  aspirin EC  81 mg Oral QPM   azithromycin  500 mg Oral Daily   enoxaparin (LOVENOX)  injection  40 mg Subcutaneous Q24H   famotidine  20 mg Oral Daily   furosemide  20 mg Oral Daily   melatonin  5 mg Oral  QHS   metFORMIN  1,000 mg Oral BID WC   metoprolol tartrate  50 mg Oral BID   montelukast  10 mg Oral QHS   polyethylene glycol  17 g Oral Daily   rosuvastatin  40 mg Oral Daily   sodium chloride flush  3 mL Intravenous Q12H   Continuous Infusions:  sodium chloride     cefTRIAXone (ROCEPHIN)  IV 2 g (03/04/21 1702)     LOS: 2 days        Hosie Poisson, MD [facility]   To contact the attending provider between 7A-7P or the covering provider during after hours 7P-7A, please log into the web site [email or web address] and access using universal [place] password for that web site. If you do not have the password, please call the hospital operator.  03/04/2021, 5:47 PM

## 2021-03-04 NOTE — Plan of Care (Signed)
°  Problem: Education: Goal: Knowledge of General Education information will improve Description: Including pain rating scale, medication(s)/side effects and non-pharmacologic comfort measures Outcome: Progressing   Problem: Clinical Measurements: Goal: Respiratory complications will improve Outcome: Progressing   Problem: Clinical Measurements: Goal: Cardiovascular complication will be avoided Outcome: Progressing   Problem: Activity: Goal: Risk for activity intolerance will decrease Outcome: Progressing   Problem: Nutrition: Goal: Adequate nutrition will be maintained Outcome: Progressing   Problem: Coping: Goal: Level of anxiety will decrease Outcome: Progressing   Problem: Pain Managment: Goal: General experience of comfort will improve Outcome: Progressing   Problem: Safety: Goal: Ability to remain free from injury will improve Outcome: Progressing

## 2021-03-05 DIAGNOSIS — J9601 Acute respiratory failure with hypoxia: Secondary | ICD-10-CM | POA: Diagnosis not present

## 2021-03-05 DIAGNOSIS — E1159 Type 2 diabetes mellitus with other circulatory complications: Secondary | ICD-10-CM | POA: Diagnosis not present

## 2021-03-05 DIAGNOSIS — I152 Hypertension secondary to endocrine disorders: Secondary | ICD-10-CM

## 2021-03-05 DIAGNOSIS — J189 Pneumonia, unspecified organism: Secondary | ICD-10-CM | POA: Diagnosis not present

## 2021-03-05 DIAGNOSIS — A419 Sepsis, unspecified organism: Secondary | ICD-10-CM | POA: Diagnosis not present

## 2021-03-05 LAB — CBC
HCT: 48.2 % (ref 39.0–52.0)
Hemoglobin: 15.3 g/dL (ref 13.0–17.0)
MCH: 26.8 pg (ref 26.0–34.0)
MCHC: 31.7 g/dL (ref 30.0–36.0)
MCV: 84.4 fL (ref 80.0–100.0)
Platelets: 277 10*3/uL (ref 150–400)
RBC: 5.71 MIL/uL (ref 4.22–5.81)
RDW: 16.9 % — ABNORMAL HIGH (ref 11.5–15.5)
WBC: 13.5 10*3/uL — ABNORMAL HIGH (ref 4.0–10.5)
nRBC: 0 % (ref 0.0–0.2)

## 2021-03-05 LAB — BASIC METABOLIC PANEL
Anion gap: 10 (ref 5–15)
BUN: 33 mg/dL — ABNORMAL HIGH (ref 8–23)
CO2: 31 mmol/L (ref 22–32)
Calcium: 9.6 mg/dL (ref 8.9–10.3)
Chloride: 94 mmol/L — ABNORMAL LOW (ref 98–111)
Creatinine, Ser: 1.17 mg/dL (ref 0.61–1.24)
GFR, Estimated: >60 mL/min (ref >60)
Glucose, Bld: 138 mg/dL — ABNORMAL HIGH (ref 70–99)
Potassium: 3.8 mmol/L (ref 3.5–5.1)
Sodium: 135 mmol/L (ref 135–145)

## 2021-03-05 LAB — GLUCOSE, CAPILLARY
Glucose-Capillary: 181 mg/dL — ABNORMAL HIGH (ref 70–99)
Glucose-Capillary: 147 mg/dL — ABNORMAL HIGH (ref 70–99)
Glucose-Capillary: 102 mg/dL — ABNORMAL HIGH (ref 70–99)
Glucose-Capillary: 141 mg/dL — ABNORMAL HIGH (ref 70–99)

## 2021-03-05 MED ORDER — IPRATROPIUM-ALBUTEROL 0.5-2.5 (3) MG/3ML IN SOLN
3.0000 mL | RESPIRATORY_TRACT | Status: DC | PRN
  Administered 2021-03-05: 3 mL via RESPIRATORY_TRACT
  Filled 2021-03-05: qty 3

## 2021-03-05 NOTE — Progress Notes (Signed)
PROGRESS NOTE    Bradley [name]  FKC:[id number] DOB: 01-08-1951 DOA: 03/01/2021 PCP: Valera Castle, MD    Chief Complaint  Patient presents with   Pneumonia    Brief Narrative:   Bradley [name] is a 71 y.o. male with medical history significant for recent hospitalization for sepsis from pneumonia from 02/05/2021 through 02/09/2021, type II DM, hypertension, hyperlipidemia, CAD, severe COPD, GERD, alcohol use disorder, history of tobacco use disorder (quit smoking 2017).  He presented to the hospital with cough, shortness of breath and wheezing. He said he had to go up from 2 L to 3 L/min oxygen to maintain oxygen saturation above 90s.   He was admitted to the hospital for severe sepsis secondary to multifocal pneumonia complicated by acute on chronic hypoxemic respiratory failure.  He was treated with IV antibiotics and IV fluids.    Assessment & Plan:   Principal Problem:   Severe sepsis ([place]) Active Problems:   Adjustment disorder with mixed anxiety and depressed mood   Type 2 diabetes mellitus with vascular disease (HCC)   CVD (cardiovascular disease)   Hypertension associated with diabetes ([place])   Multifocal pneumonia  Severe sepsis secondary to multifocal pneumonia.  Much improved sepsis physiology.  Resume IV antibiotics for another 24 hours . Will plan for discharge in the morning.  So far cultures are negative. Sputum cultures show normal flora. Wean his oxygen to 2 lit/min in the next 24 hours.  Check cbc and bmp today.     Hypertension:  BP parameters are optimal.    Hypokalemia Replaced.     Type 2 DM: non insulin dependent, with hyperglycemia.  CBG (last 3)  Recent Labs    03/04/21 0903 03/04/21 2013 03/05/21 0917  GLUCAP 271* 149* 181*    Continue with SSI.  A!c is 8.3% Continue with metformin.    Hyperlipidemia:  Resume crestor.         DVT prophylaxis: (Lovenox/) Code Status: Full code.  Family Communication: none at  bedside.  Disposition:   Status is: Inpatient  Remains inpatient appropriate because: IV antibiotics.        Consultants:  NOne.  Procedures: none.   Antimicrobials:  Antibiotics Given (last 72 hours)     Date/Time Action Medication Dose Rate   03/02/21 2143 New Bag/Given   cefTRIAXone (ROCEPHIN) 2 g in sodium chloride 0.9 % 100 mL IVPB 2 g 200 mL/hr   03/03/21 1051 Given   azithromycin (ZITHROMAX) tablet 500 mg 500 mg    03/03/21 1709 New Bag/Given   cefTRIAXone (ROCEPHIN) 2 g in sodium chloride 0.9 % 100 mL IVPB 2 g 200 mL/hr   03/04/21 0948 Given   azithromycin (ZITHROMAX) tablet 500 mg 500 mg    03/04/21 1702 New Bag/Given   cefTRIAXone (ROCEPHIN) 2 g in sodium chloride 0.9 % 100 mL IVPB 2 g 200 mL/hr   03/05/21 0935 Given   azithromycin (ZITHROMAX) tablet 500 mg 500 mg          Subjective: He reports improving slowly, has a lot of cough.  Not back to baseline yet.   Objective: Vitals:   03/04/21 1623 03/04/21 1951 03/05/21 0427 03/05/21 0917  BP: 113/65 115/77 90/63 117/69  Pulse: 85 (!) 102 85 (!) 104  Resp: 17 18 18 16   Temp: 98 F (36.7 C) 98 F (36.7 C) 98 F (36.7 C) 98 F (36.7 C)  TempSrc: Oral Oral Oral Oral  SpO2: 91% 93% 96% 91%  Weight:  Height:        Intake/Output Summary (Last 24 hours) at 03/05/2021 1015 Last data filed at 03/05/2021 0426 Gross per 24 hour  Intake 720 ml  Output 951 ml  Net -231 ml    Filed Weights   03/01/21 1424  Weight: 65.3 kg    Examination: General exam: Appears calm and comfortable  Respiratory system: air entry fair on 3 lit of [place] oxygen.  Cardiovascular system: S1 & S2 heard, tachycardic, No pedal edema. Gastrointestinal system: Abdomen is nondistended, soft and nontender. Normal bowel sounds heard. Central nervous system: Alert and oriented. No focal neurological deficits. Extremities: Symmetric 5 x 5 power. Skin: No rashes, lesions or ulcers Psychiatry: Judgement and insight appear normal.  Mood & affect appropriate.      Data Reviewed: I have personally reviewed following labs and imaging studies  CBC: Recent Labs  Lab 03/01/21 1427 03/02/21 0713 03/03/21 0618  WBC 14.6* 10.9* 10.5  NEUTROABS  --   --  5.7  HGB 14.5 12.2* 13.2  HCT 46.5 37.9* 42.1  MCV 86.4 83.7 84.5  PLT 265 213 229     Basic Metabolic Panel: Recent Labs  Lab 03/01/21 1427 03/02/21 0713 03/03/21 0618  NA 138 136 141  K 4.2 3.4* 4.1  CL 106 105 104  CO2 22 24 29   GLUCOSE 163* 167* 128*  BUN 28* 21 20  CREATININE 1.10 0.99 1.14  CALCIUM 9.5 8.7* 9.2  MG  --   --  1.9     GFR: Estimated Creatinine Clearance: 54.9 mL/min (by C-G formula based on SCr of 1.14 mg/dL).  Liver Function Tests: No results for input(s): AST, ALT, ALKPHOS, BILITOT, PROT, ALBUMIN in the last 168 hours.  CBG: Recent Labs  Lab 03/04/21 0903 03/04/21 2013 03/05/21 0917  GLUCAP 271* 149* 181*      Recent Results (from the past 240 hour(s))  Blood Culture (routine x 2)     Status: None (Preliminary result)   Collection Time: 03/01/21  4:05 PM   Specimen: BLOOD  Result Value Ref Range Status   Specimen Description BLOOD BLOOD LEFT WRIST  Final   Special Requests   Final    BOTTLES DRAWN AEROBIC ONLY Blood Culture adequate volume   Culture   Final    NO GROWTH 3 DAYS Performed at [facility], [address] [place], [place] [address]    Report Status PENDING  Incomplete  Blood Culture (routine x 2)     Status: None (Preliminary result)   Collection Time: 03/01/21  4:05 PM   Specimen: BLOOD  Result Value Ref Range Status   Specimen Description BLOOD BLOOD LEFT FOREARM  Final   Special Requests   Final    BOTTLES DRAWN AEROBIC ONLY Blood Culture adequate volume   Culture   Final    NO GROWTH 3 DAYS Performed at [facility], [address] [place],  [address]    Report Status PENDING  Incomplete  Resp Panel by RT-PCR (Flu A&B, Covid) Nasopharyngeal Swab      Status: None   Collection Time: 03/01/21  4:45 PM   Specimen: Nasopharyngeal Swab; Nasopharyngeal(NP) swabs in vial transport medium  Result Value Ref Range Status   SARS Coronavirus 2 by RT PCR NEGATIVE NEGATIVE Final    Comment: (NOTE) SARS-CoV-2 target nucleic acids are NOT DETECTED.  The SARS-CoV-2 RNA is generally detectable in upper respiratory specimens during the acute phase of infection. The lowest concentration of SARS-CoV-2 viral copies this  assay can detect is 138 copies/mL. A negative result does not preclude SARS-Cov-2 infection and should not be used as the sole basis for treatment or other patient management decisions. A negative result may occur with  improper specimen collection/handling, submission of specimen other than nasopharyngeal swab, presence of viral mutation(s) within the areas targeted by this assay, and inadequate number of viral copies(<138 copies/mL). A negative result must be combined with clinical observations, patient history, and epidemiological information. The expected result is Negative.  Fact Sheet for Patients:  EntrepreneurPulse.com.au  Fact Sheet for Healthcare Providers:  IncredibleEmployment.be  This test is no t yet approved or cleared by the [place] FDA and  has been authorized for detection and/or diagnosis of SARS-CoV-2 by FDA under an Emergency Use Authorization (EUA). This EUA will remain  in effect (meaning this test can be used) for the duration of the COVID-19 declaration under Section 564(b)(1) of the Act, 21 U.S.C.section 360bbb-3(b)(1), unless the authorization is terminated  or revoked sooner.       Influenza A by PCR NEGATIVE NEGATIVE Final   Influenza B by PCR NEGATIVE NEGATIVE Final    Comment: (NOTE) The Xpert Xpress SARS-CoV-2/FLU/RSV plus assay is intended as an aid in the diagnosis of influenza from Nasopharyngeal swab specimens and should not be used as a sole basis  for treatment. Nasal washings and aspirates are unacceptable for Xpert Xpress SARS-CoV-2/FLU/RSV testing.  Fact Sheet for Patients: EntrepreneurPulse.com.au  Fact Sheet for Healthcare Providers: IncredibleEmployment.be  This test is not yet approved or cleared by the [place] FDA and has been authorized for detection and/or diagnosis of SARS-CoV-2 by FDA under an Emergency Use Authorization (EUA). This EUA will remain in effect (meaning this test can be used) for the duration of the COVID-19 declaration under Section 564(b)(1) of the Act, 21 U.S.C. section 360bbb-3(b)(1), unless the authorization is terminated or revoked.  Performed at [facility], [address] [place], [place] [address]   Expectorated Sputum Assessment w Gram Stain, Rflx to Resp Cult     Status: None   Collection Time: 03/01/21  5:27 PM   Specimen: Expectorated Sputum  Result Value Ref Range Status   Specimen Description EXPECTORATED SPUTUM  Final   Special Requests NONE  Final   Sputum evaluation   Final    THIS SPECIMEN IS ACCEPTABLE FOR SPUTUM CULTURE Performed at [facility], [address] [place], [place] [address]    Report Status 03/01/2021 FINAL  Final  Culture, Respiratory w Gram Stain     Status: None   Collection Time: 03/01/21  5:27 PM  Result Value Ref Range Status   Specimen Description   Final    EXPECTORATED SPUTUM Performed at [facility], [address], [place], [place] [address]    Special Requests   Final    NONE Reflexed from [id number] Performed at [facility], [place], [place] [address]    Gram Stain   Final    RARE SQUAMOUS EPITHELIAL CELLS PRESENT MODERATE WBC PRESENT,BOTH PMN AND MONONUCLEAR FEW GRAM POSITIVE COCCI RARE GRAM NEGATIVE RODS RARE GRAM POSITIVE RODS    Culture   Final    RARE Normal respiratory flora-no Staph aureus or Pseudomonas seen Performed at [facility], [place] [address], [place], [place] [address]    Report Status 03/03/2021 FINAL  Final          Radiology Studies: No results found.      Scheduled Meds:  aspirin EC  81 mg Oral QPM   enoxaparin (LOVENOX) injection  40 mg Subcutaneous Q24H   famotidine  20 mg Oral Daily   furosemide  20 mg Oral Daily   insulin aspart  0-15 Units Subcutaneous TID WC   melatonin  5 mg Oral QHS   metFORMIN  1,000 mg Oral BID WC   metoprolol tartrate  50 mg Oral BID   montelukast  10 mg Oral QHS   polyethylene glycol  17 g Oral Daily   rosuvastatin  40 mg Oral Daily   sodium chloride flush  3 mL Intravenous Q12H   Continuous Infusions:  sodium chloride     cefTRIAXone (ROCEPHIN)  IV 2 g (03/04/21 1702)     LOS: 3 days        Hosie Poisson, MD [facility]   To contact the attending provider between 7A-7P or the covering provider during after hours 7P-7A, please log into the web site [email or web address] and access using universal [place] password for that web site. If you do not have the password, please call the hospital operator.  03/05/2021, 10:15 AM

## 2021-03-06 LAB — CULTURE, BLOOD (ROUTINE X 2)
Culture: NO GROWTH
Culture: NO GROWTH
Special Requests: ADEQUATE
Special Requests: ADEQUATE

## 2021-03-06 LAB — GLUCOSE, CAPILLARY
Glucose-Capillary: 144 mg/dL — ABNORMAL HIGH (ref 70–99)
Glucose-Capillary: 124 mg/dL — ABNORMAL HIGH (ref 70–99)

## 2021-03-06 MED ORDER — IPRATROPIUM-ALBUTEROL 0.5-2.5 (3) MG/3ML IN SOLN: 3.0000 mL | RESPIRATORY_TRACT | 2 refills | Status: DC | PRN

## 2021-03-06 MED ORDER — LEVOFLOXACIN 750 MG PO TABS: 750.0000 mg | ORAL_TABLET | Freq: Every day | ORAL | 0 refills | Status: AC

## 2021-03-06 NOTE — Care Management Important Message (Signed)
Important Message  Patient Details  Name: Bradley [name] MRN: [id number] Date of Birth: 1951-01-18   Medicare Important Message Given:  Yes     Juliann Pulse A Lancer Thurner 03/06/2021, 12:25 PM

## 2021-03-06 NOTE — Progress Notes (Signed)
PT Cancellation Note  Patient Details Name: Bradley [name] MRN: [id number] DOB: Jun 02, 1950   Cancelled Treatment:    Reason Eval/Treat Not Completed: PT screened, no needs identified, will sign off. PT screened, no needs identified, will sign off. Per staff report, pt is independent in all aspects of care and at baseline level of function.   Lieutenant Diego PT, DPT 1:33 PM,03/06/21

## 2021-03-06 NOTE — Progress Notes (Signed)
Patient is being discharge home to self care. IV has been removed. Discharge instructions given and discussed. Patient educated on discharge instruction.

## 2021-03-06 NOTE — Progress Notes (Signed)
OT Cancellation Note  Patient Details Name: Bradley [name] MRN: [id number] DOB: [birth year]   Cancelled Treatment:    Reason Eval/Treat Not Completed: OT screened, no needs identified, will sign off. Per staff report, pt is independent in all aspects of care and at baseline level of function. Pt does not need skilled OT intervention.   Darleen Crocker, MS, OTR/L , CBIS ascom [phone]  03/06/21, 11:58 AM

## 2021-03-14 NOTE — Discharge Summary (Signed)
Physician Discharge Summary  Bradley [name] ZOX:[id number] DOB: 12-05-1950 DOA: 03/01/2021  PCP: Valera Castle, MD  Admit date: 03/01/2021 Discharge date: 03/06/2021  Admitted From: Home, Disposition:  Home  Recommendations for Outpatient Follow-up:  Follow up with PCP in 1-2 weeks Please obtain BMP/CBC in one week   Discharge Condition:Stable CODE STATUS:FULL Diet recommendation: Heart Healthy  Brief/Interim Summary: Bradley [name] is a 71 y.o. male with medical history significant for recent hospitalization for sepsis from pneumonia from 02/05/2021 through 02/09/2021, type II DM, hypertension, hyperlipidemia, CAD, severe COPD, GERD, alcohol use disorder, history of tobacco use disorder (quit smoking 2017).  He presented to the hospital with cough, shortness of breath and wheezing. He said he had to go up from 2 L to 3 L/min oxygen to maintain oxygen saturation above 90s.   He was admitted to the hospital for severe sepsis secondary to multifocal pneumonia complicated by acute on chronic hypoxemic respiratory failure.  He was treated with IV antibiotics and IV fluids.  Discharge Diagnoses:  Principal Problem:   Severe sepsis ([place]) Active Problems:   Adjustment disorder with mixed anxiety and depressed mood   Type 2 diabetes mellitus with vascular disease (HCC)   CVD (cardiovascular disease)   Hypertension associated with diabetes ([place])   Multifocal pneumonia  Severe sepsis secondary to multifocal pneumonia.  Much improved sepsis physiology.  Started on IV antibiotics , transitioned to oral on discharge.  So far cultures are negative. Sputum cultures show normal flora. Wean his oxygen to 2 lit/min in the next 24 hours.         Hypertension:  BP parameters are optimal.      Hypokalemia Replaced.        Type 2 DM: non insulin dependent, with hyperglycemia.     Continue with SSI.  A!c is 8.3% Continue with metformin.      Hyperlipidemia:  Resume crestor.        Discharge Instructions  Discharge Instructions     Diet - low sodium heart healthy   Complete by: As directed    Discharge instructions   Complete by: As directed    Please follow up with PCP I n one week.   Increase activity slowly   Complete by: As directed       Allergies as of 03/06/2021   No Known Allergies      Medication List     STOP taking these medications    levalbuterol 0.31 MG/3ML nebulizer solution Commonly known as: XOPENEX   levofloxacin 750 MG tablet Commonly known as: LEVAQUIN   predniSONE 20 MG tablet Commonly known as: DELTASONE       TAKE these medications    aspirin EC 81 MG tablet Take 81 mg by mouth every evening.   dextromethorphan-guaiFENesin 10-100 MG/5ML liquid Commonly known as: ROBITUSSIN-DM Take 5 mLs by mouth every 4 (four) hours as needed.   famotidine 20 MG tablet Commonly known as: PEPCID Take 20 mg by mouth daily.   Fluticasone-Salmeterol 250-50 MCG/DOSE Aepb Commonly known as: ADVAIR Inhale 1 puff into the lungs 2 (two) times daily.   furosemide 20 MG tablet Commonly known as: LASIX Take 20 mg by mouth daily.   ipratropium-albuterol 0.5-2.5 (3) MG/3ML Soln Commonly known as: DUONEB Take 3 mLs by nebulization every 4 (four) hours as needed.   metFORMIN 1000 MG tablet Commonly known as: Glucophage Take 1 tablet (1,000 mg total) by mouth 2 (two) times daily with a meal.   metoprolol tartrate 50 MG  tablet Commonly known as: LOPRESSOR Take 50 mg by mouth 2 (two) times daily.   montelukast 10 MG tablet Commonly known as: SINGULAIR Take 10 mg by mouth at bedtime.   nitroGLYCERIN 0.4 MG SL tablet Commonly known as: NITROSTAT Place 0.4 mg under the tongue every 5 (five) minutes x 3 doses as needed for chest pain.   polyethylene glycol 17 g packet Commonly known as: MIRALAX / GLYCOLAX Take 17 g by mouth daily.   ProAir RespiClick 258 (90 Base) MCG/ACT Aepb Generic drug: Albuterol Sulfate INHALE 1 PUFF  BY MOUTH EVERY 4 HOURS AS NEEDED FOR WHEEZING   rosuvastatin 40 MG tablet Commonly known as: CRESTOR Take 40 mg by mouth daily.       ASK your doctor about these medications    levofloxacin 750 MG tablet Commonly known as: LEVAQUIN Take 1 tablet (750 mg total) by mouth daily for 3 days. Ask about: Should I take this medication?        No Known Allergies  Consultations: None.    Procedures/Studies: DG Chest 2 View  Result Date: 03/01/2021 CLINICAL DATA:  Shortness of breath and pneumonia. EXAM: CHEST - 2 VIEW COMPARISON:  02/05/2021 FINDINGS: Heart size and pulmonary vascularity are normal. Emphysematous changes and fibrosis in the lungs. Superimposed infiltration in the left mid lung and both lung bases. Changes likely represent multifocal pneumonia. Small right pleural effusion. Similar appearance to previous study, allowing for differences in technique. Surgical clips in the right axilla. Calcification of the aorta. IMPRESSION: Chronic emphysematous and bronchitic changes in the lungs. Superimposed infiltrates in the lung bases and left mid lung likely representing multifocal pneumonia. Small right pleural effusion. Electronically Signed   By: Lucienne Capers M.D.   On: 03/01/2021 15:07      Subjective:  No complaints.  Discharge Exam: Vitals:   03/06/21 0813 03/06/21 1243  BP: 106/68 112/73  Pulse: 87 72  Resp: 16 16  Temp: 98.2 F (36.8 C) 98 F (36.7 C)  SpO2: 92% 92%   Vitals:   03/05/21 1959 03/06/21 0523 03/06/21 0813 03/06/21 1243  BP: 127/65 102/64 106/68 112/73  Pulse: 98 82 87 72  Resp: 19 19 16 16   Temp: 98.2 F (36.8 C) 98.2 F (36.8 C) 98.2 F (36.8 C) 98 F (36.7 C)  TempSrc:    Oral  SpO2: 92% 95% 92% 92%  Weight:      Height:        General: Pt is alert, awake, not in acute distress Cardiovascular: RRR, S1/S2 +, no rubs, no gallops Respiratory: CTA bilaterally, no wheezing, no rhonchi Abdominal: Soft, NT, ND, bowel sounds  + Extremities: no edema, no cyanosis    The results of significant diagnostics from this hospitalization (including imaging, microbiology, ancillary and laboratory) are listed below for reference.     Microbiology: No results found for this or any previous visit (from the past 240 hour(s)).   Labs: BNP (last 3 results) Recent Labs    02/05/21 1851 02/05/21 2155 03/01/21 1645  BNP 44.4 59.5 52.7   Basic Metabolic Panel: No results for input(s): NA, K, CL, CO2, GLUCOSE, BUN, CREATININE, CALCIUM, MG, PHOS in the last 168 hours. Liver Function Tests: No results for input(s): AST, ALT, ALKPHOS, BILITOT, PROT, ALBUMIN in the last 168 hours. No results for input(s): LIPASE, AMYLASE in the last 168 hours. No results for input(s): AMMONIA in the last 168 hours. CBC: No results for input(s): WBC, NEUTROABS, HGB, HCT, MCV, PLT in the last 168  hours. Cardiac Enzymes: No results for input(s): CKTOTAL, CKMB, CKMBINDEX, TROPONINI in the last 168 hours. BNP: Invalid input(s): POCBNP CBG: No results for input(s): GLUCAP in the last 168 hours. D-Dimer No results for input(s): DDIMER in the last 72 hours. Hgb A1c No results for input(s): HGBA1C in the last 72 hours. Lipid Profile No results for input(s): CHOL, HDL, LDLCALC, TRIG, CHOLHDL, LDLDIRECT in the last 72 hours. Thyroid function studies No results for input(s): TSH, T4TOTAL, T3FREE, THYROIDAB in the last 72 hours.  Invalid input(s): FREET3 Anemia work up No results for input(s): VITAMINB12, FOLATE, FERRITIN, TIBC, IRON, RETICCTPCT in the last 72 hours. Urinalysis    Component Value Date/Time   COLORURINE YELLOW (A) 03/01/2021 1938   APPEARANCEUR CLOUDY (A) 03/01/2021 1938   LABSPEC 1.016 03/01/2021 1938   PHURINE 5.0 03/01/2021 1938   GLUCOSEU >=500 (A) 03/01/2021 1938   HGBUR SMALL (A) 03/01/2021 1938   BILIRUBINUR NEGATIVE 03/01/2021 1938   KETONESUR NEGATIVE 03/01/2021 1938   PROTEINUR 100 (A) 03/01/2021 1938    NITRITE NEGATIVE 03/01/2021 1938   LEUKOCYTESUR NEGATIVE 03/01/2021 1938   Sepsis Labs Invalid input(s): PROCALCITONIN,  WBC,  LACTICIDVEN Microbiology No results found for this or any previous visit (from the past 240 hour(s)).   Time coordinating discharge: 36 minutes  SIGNED:   Hosie Poisson, MD  [facility]

## 2021-04-02 ENCOUNTER — Ambulatory Visit: Payer: Medicare HMO

## 2021-05-03 ENCOUNTER — Ambulatory Visit: Payer: Medicare HMO | Admitting: Podiatry

## 2021-07-09 ENCOUNTER — Other Ambulatory Visit: Payer: Self-pay

## 2021-07-09 ENCOUNTER — Emergency Department: Payer: Medicare HMO

## 2021-07-09 ENCOUNTER — Inpatient Hospital Stay
Admission: EM | Admit: 2021-07-09 | Discharge: 2021-07-15 | DRG: 871 | Disposition: A | Discharge status: Home / Self Care | Payer: Medicare HMO | Attending: Student | Admitting: Student

## 2021-07-09 DIAGNOSIS — Z20822 Contact with and (suspected) exposure to covid-19: Secondary | ICD-10-CM | POA: Diagnosis present

## 2021-07-09 DIAGNOSIS — Z8249 Family history of ischemic heart disease and other diseases of the circulatory system: Secondary | ICD-10-CM

## 2021-07-09 DIAGNOSIS — Z7984 Long term (current) use of oral hypoglycemic drugs: Secondary | ICD-10-CM | POA: Diagnosis not present

## 2021-07-09 DIAGNOSIS — E119 Type 2 diabetes mellitus without complications: Secondary | ICD-10-CM

## 2021-07-09 DIAGNOSIS — A4189 Other specified sepsis: Principal | ICD-10-CM | POA: Diagnosis present

## 2021-07-09 DIAGNOSIS — Z6823 Body mass index (BMI) 23.0-23.9, adult: Secondary | ICD-10-CM

## 2021-07-09 DIAGNOSIS — B9789 Other viral agents as the cause of diseases classified elsewhere: Secondary | ICD-10-CM | POA: Diagnosis present

## 2021-07-09 DIAGNOSIS — I2510 Atherosclerotic heart disease of native coronary artery without angina pectoris: Secondary | ICD-10-CM | POA: Diagnosis present

## 2021-07-09 DIAGNOSIS — E440 Moderate protein-calorie malnutrition: Secondary | ICD-10-CM | POA: Diagnosis present

## 2021-07-09 DIAGNOSIS — J440 Chronic obstructive pulmonary disease with acute lower respiratory infection: Secondary | ICD-10-CM | POA: Diagnosis present

## 2021-07-09 DIAGNOSIS — I10 Essential (primary) hypertension: Secondary | ICD-10-CM | POA: Diagnosis present

## 2021-07-09 DIAGNOSIS — J441 Chronic obstructive pulmonary disease with (acute) exacerbation: Secondary | ICD-10-CM

## 2021-07-09 DIAGNOSIS — E785 Hyperlipidemia, unspecified: Secondary | ICD-10-CM | POA: Diagnosis not present

## 2021-07-09 DIAGNOSIS — R54 Age-related physical debility: Secondary | ICD-10-CM | POA: Diagnosis present

## 2021-07-09 DIAGNOSIS — J1289 Other viral pneumonia: Secondary | ICD-10-CM | POA: Diagnosis present

## 2021-07-09 DIAGNOSIS — J9601 Acute respiratory failure with hypoxia: Secondary | ICD-10-CM | POA: Diagnosis not present

## 2021-07-09 DIAGNOSIS — J9621 Acute and chronic respiratory failure with hypoxia: Secondary | ICD-10-CM | POA: Diagnosis present

## 2021-07-09 DIAGNOSIS — E43 Unspecified severe protein-calorie malnutrition: Secondary | ICD-10-CM | POA: Insufficient documentation

## 2021-07-09 DIAGNOSIS — A419 Sepsis, unspecified organism: Secondary | ICD-10-CM

## 2021-07-09 DIAGNOSIS — Z87891 Personal history of nicotine dependence: Secondary | ICD-10-CM | POA: Diagnosis not present

## 2021-07-09 DIAGNOSIS — J189 Pneumonia, unspecified organism: Secondary | ICD-10-CM

## 2021-07-09 LAB — RESP PANEL BY RT-PCR (FLU A&B, COVID) ARPGX2
Influenza A by PCR: NEGATIVE
Influenza B by PCR: NEGATIVE
SARS Coronavirus 2 by RT PCR: NEGATIVE

## 2021-07-09 LAB — CBC
HCT: 45.7 % (ref 39.0–52.0)
Hemoglobin: 14.4 g/dL (ref 13.0–17.0)
MCH: 26.4 pg (ref 26.0–34.0)
MCHC: 31.5 g/dL (ref 30.0–36.0)
MCV: 83.9 fL (ref 80.0–100.0)
Platelets: 277 10*3/uL (ref 150–400)
RBC: 5.45 MIL/uL (ref 4.22–5.81)
RDW: 15.2 % (ref 11.5–15.5)
WBC: 18.1 10*3/uL — ABNORMAL HIGH (ref 4.0–10.5)
nRBC: 0.1 % (ref 0.0–0.2)

## 2021-07-09 LAB — BASIC METABOLIC PANEL
Anion gap: 7 (ref 5–15)
BUN: 20 mg/dL (ref 8–23)
CO2: 20 mmol/L — ABNORMAL LOW (ref 22–32)
Calcium: 9.1 mg/dL (ref 8.9–10.3)
Chloride: 107 mmol/L (ref 98–111)
Creatinine, Ser: 1.05 mg/dL (ref 0.61–1.24)
GFR, Estimated: >60 mL/min (ref >60)
Glucose, Bld: 190 mg/dL — ABNORMAL HIGH (ref 70–99)
Potassium: 4.4 mmol/L (ref 3.5–5.1)
Sodium: 134 mmol/L — ABNORMAL LOW (ref 135–145)

## 2021-07-09 LAB — TROPONIN I (HIGH SENSITIVITY): Troponin I (High Sensitivity): 7 ng/L (ref <18)

## 2021-07-09 MED ORDER — IPRATROPIUM-ALBUTEROL 0.5-2.5 (3) MG/3ML IN SOLN
3.0000 mL | Freq: Once | RESPIRATORY_TRACT | Status: AC
Start: 2021-07-09 — End: 2021-07-09
  Administered 2021-07-09: 3 mL via RESPIRATORY_TRACT
  Filled 2021-07-09: qty 3

## 2021-07-09 MED ORDER — SODIUM CHLORIDE 0.9 % IV SOLN
1.0000 g | Freq: Once | INTRAVENOUS | Status: AC
  Administered 2021-07-09: 1 g via INTRAVENOUS
  Filled 2021-07-09: qty 10

## 2021-07-09 MED ORDER — IPRATROPIUM-ALBUTEROL 0.5-2.5 (3) MG/3ML IN SOLN
3.0000 mL | Freq: Once | RESPIRATORY_TRACT | Status: AC
  Administered 2021-07-09: 3 mL via RESPIRATORY_TRACT
  Filled 2021-07-09: qty 3

## 2021-07-09 MED ORDER — SODIUM CHLORIDE 0.9 % IV SOLN
500.0000 mg | Freq: Once | INTRAVENOUS | Status: AC
  Administered 2021-07-09: 500 mg via INTRAVENOUS
  Filled 2021-07-09: qty 5

## 2021-07-09 MED ORDER — METHYLPREDNISOLONE SODIUM SUCC 125 MG IJ SOLR
125.0000 mg | Freq: Once | INTRAMUSCULAR | Status: AC
  Administered 2021-07-09: 125 mg via INTRAVENOUS
  Filled 2021-07-09: qty 2

## 2021-07-09 NOTE — ED Notes (Signed)
Pt getting breathing treatments at this time ?

## 2021-07-09 NOTE — ED Notes (Signed)
Pt given cup of decaf coffee and milk at this time. Pt denies any pain. Resting comfortably ?

## 2021-07-09 NOTE — ED Notes (Signed)
MD in room at this time.

## 2021-07-09 NOTE — ED Provider Notes (Signed)
? ?[facility] ?Provider Note ? ? ? Event Date/Time  ? First MD Initiated Contact with Patient 07/09/21 2034   ?  (approximate) ? ? ?History  ? ?Shortness of Breath ? ? ?HPI ? ?Bradley [name] is a 71 y.o. male  who, per primary care note dated 06/25/21 has history of COPD, HLD, who presents to the emergency department today because of concern for shortness of breath. The patient states that he started feeling short of breath roughly 3 hours prior to arrival.  He states that earlier in the day it had been a normal day for him.  No unusual activity.  The patient did try bumping his home oxygen from 2-1/2 L to 4 L but continued to feel short of breath.  He did try his home breathing treatments without any significant relief.  He denies any associated chest pain.  No recent fevers. ? ? ?Physical Exam  ? ?Triage Vital Signs: ?ED Triage Vitals  ?Enc Vitals Group  ?   BP 07/09/21 2030 126/77  ?   Pulse Rate 07/09/21 2030 96  ?   Resp 07/09/21 2030 (!) 23  ?   Temp 07/09/21 2030 98.4 ?F (36.9 ?C)  ?   Temp Source 07/09/21 2030 Oral  ?   SpO2 07/09/21 2029 91 %  ?   Weight 07/09/21 2030 157 lb (71.2 kg)  ?   Height 07/09/21 2030 5\' 7"  (1.702 m)  ?   Head Circumference --   ?   Peak Flow --   ?   Pain Score 07/09/21 2030 0  ? ?Most recent vital signs: ?Vitals:  ? 07/09/21 2033 07/09/21 2035  ?BP:    ?Pulse: 95 94  ?Resp: (!) 23 (!) 28  ?Temp:    ?SpO2: (!) 88% 91%  ? ? ?General: Awake, alert and oriented. ?CV:  Good peripheral perfusion. Regular rate and rhythm. ?Resp:  Increased respiratory effort. Poor air movement diffusely. ?Abd:  No distention.  ? ? ?ED Results / Procedures / Treatments  ? ?Labs ?(all labs ordered are listed, but only abnormal results are displayed) ?Labs Reviewed  ?BASIC METABOLIC PANEL - Abnormal; Notable for the following components:  ?    Result Value  ? Sodium 134 (*)   ? CO2 20 (*)   ? Glucose, Bld 190 (*)   ? All other components within normal limits  ?CBC - Abnormal; Notable  for the following components:  ? WBC 18.1 (*)   ? All other components within normal limits  ?RESP PANEL BY RT-PCR (FLU A&B, COVID) [id number]  ?CULTURE, BLOOD (ROUTINE X 2)  ?CULTURE, BLOOD (ROUTINE X 2)  ?TROPONIN I (HIGH SENSITIVITY)  ?TROPONIN I (HIGH SENSITIVITY)  ? ? ? ?EKG ? ?INance Pear, attending physician, personally viewed and interpreted this EKG ? ?EKG Time: 2031 ?Rate: 95 ?Rhythm: sinus rhythm ?Axis: rightward axis ?Intervals: qtc 489 ?QRS: RBBB ?ST changes: no st elevation ?Impression: abnormal ekg ? ?RADIOLOGY ?I independently interpreted and visualized the CXR. My interpretation: Small right pleural effusion ?Radiology interpretation:  ?IMPRESSION:  ?Advanced emphysema. Patchy airspace disease in both lower lung zones  ?may be increased from prior exam, suspicious for pneumonia.  ?   ? ? ? ? ?PROCEDURES: ? ?Critical Care performed: No ? ?Procedures ? ? ?MEDICATIONS ORDERED IN ED: ?Medications - No data to display ? ? ?IMPRESSION / MDM / ASSESSMENT AND PLAN / ED COURSE  ?I reviewed the triage vital signs and the nursing notes. ?             ?               ? ?  Differential diagnosis includes, but is not limited to, COPD, CHF, pneumonia. ? ?Patient presents to the emergency department today because of concerns for shortness of breath.  On exam patient had poor air movement diffusely.  Does have history of COPD.  Was placed on high flow nasal cannula upon arrival to the ED.  Chest x-ray here is consistent with pneumonia patient does have a leukocytosis and blood work.  Patient was started on broad-spectrum antibiotics.  Discussed with Dr. Danella Penton with the hospitalist service who will plan on admission. ? ? ?FINAL CLINICAL IMPRESSION(S) / ED DIAGNOSES  ? ?Final diagnoses:  ?COPD exacerbation ([place])  ?Community acquired pneumonia, unspecified laterality  ? ? ? ?Note:  This document was prepared using Dragon voice recognition software and may include unintentional dictation errors. ? ?  ?Nance Pear,  MD ?07/09/21 2323 ? ?

## 2021-07-09 NOTE — ED Triage Notes (Signed)
pt coming from home, hx copd, chf, was hospitlized in december for COPD, pt nauseaus, was given 4mg  of zofran. pt wear regular 4L o2 at home, initial stats were 80% on that 4L, ems put him on 10L pt stat at 95% then to 12L pt went to 92% spo2. pt has 20g LFA. BP 143/91, HR 93.cbg 206.  ? ?Pt spo2 here on 10L is 91%, respiratory at door. ?

## 2021-07-10 DIAGNOSIS — A419 Sepsis, unspecified organism: Secondary | ICD-10-CM | POA: Diagnosis not present

## 2021-07-10 DIAGNOSIS — E119 Type 2 diabetes mellitus without complications: Secondary | ICD-10-CM

## 2021-07-10 DIAGNOSIS — I10 Essential (primary) hypertension: Secondary | ICD-10-CM | POA: Diagnosis present

## 2021-07-10 DIAGNOSIS — J189 Pneumonia, unspecified organism: Secondary | ICD-10-CM | POA: Diagnosis not present

## 2021-07-10 DIAGNOSIS — E785 Hyperlipidemia, unspecified: Secondary | ICD-10-CM

## 2021-07-10 LAB — CBC WITH DIFFERENTIAL/PLATELET
Abs Immature Granulocytes: 0.06 10*3/uL (ref 0.00–0.07)
Basophils Absolute: 0 10*3/uL (ref 0.0–0.1)
Basophils Relative: 0 %
Eosinophils Absolute: 0 10*3/uL (ref 0.0–0.5)
Eosinophils Relative: 0 %
HCT: 44.9 % (ref 39.0–52.0)
Hemoglobin: 13.7 g/dL (ref 13.0–17.0)
Immature Granulocytes: 0 %
Lymphocytes Relative: 4 %
Lymphs Abs: 0.6 10*3/uL — ABNORMAL LOW (ref 0.7–4.0)
MCH: 25.9 pg — ABNORMAL LOW (ref 26.0–34.0)
MCHC: 30.5 g/dL (ref 30.0–36.0)
MCV: 84.9 fL (ref 80.0–100.0)
Monocytes Absolute: 0.4 10*3/uL (ref 0.1–1.0)
Monocytes Relative: 3 %
Neutro Abs: 14.2 10*3/uL — ABNORMAL HIGH (ref 1.7–7.7)
Neutrophils Relative %: 93 %
Platelets: 251 10*3/uL (ref 150–400)
RBC: 5.29 MIL/uL (ref 4.22–5.81)
RDW: 15.2 % (ref 11.5–15.5)
WBC: 15.3 10*3/uL — ABNORMAL HIGH (ref 4.0–10.5)
nRBC: 0 % (ref 0.0–0.2)

## 2021-07-10 LAB — BRAIN NATRIURETIC PEPTIDE: B Natriuretic Peptide: 142 pg/mL — ABNORMAL HIGH (ref 0.0–100.0)

## 2021-07-10 LAB — COMPREHENSIVE METABOLIC PANEL
ALT: 17 U/L (ref 0–44)
AST: 19 U/L (ref 15–41)
Albumin: 3.2 g/dL — ABNORMAL LOW (ref 3.5–5.0)
Alkaline Phosphatase: 71 U/L (ref 38–126)
Anion gap: 9 (ref 5–15)
BUN: 23 mg/dL (ref 8–23)
CO2: 22 mmol/L (ref 22–32)
Calcium: 9.1 mg/dL (ref 8.9–10.3)
Chloride: 104 mmol/L (ref 98–111)
Creatinine, Ser: 1.16 mg/dL (ref 0.61–1.24)
GFR, Estimated: >60 mL/min (ref >60)
Glucose, Bld: 325 mg/dL — ABNORMAL HIGH (ref 70–99)
Potassium: 4.6 mmol/L (ref 3.5–5.1)
Sodium: 135 mmol/L (ref 135–145)
Total Bilirubin: 0.6 mg/dL (ref 0.3–1.2)
Total Protein: 7 g/dL (ref 6.5–8.1)

## 2021-07-10 LAB — HIV ANTIBODY (ROUTINE TESTING W REFLEX): HIV Screen 4th Generation wRfx: NONREACTIVE

## 2021-07-10 LAB — GLUCOSE, CAPILLARY
Glucose-Capillary: 340 mg/dL — ABNORMAL HIGH (ref 70–99)
Glucose-Capillary: 257 mg/dL — ABNORMAL HIGH (ref 70–99)

## 2021-07-10 LAB — CBG MONITORING, ED
Glucose-Capillary: 286 mg/dL — ABNORMAL HIGH (ref 70–99)
Glucose-Capillary: 297 mg/dL — ABNORMAL HIGH (ref 70–99)

## 2021-07-10 LAB — TROPONIN I (HIGH SENSITIVITY): Troponin I (High Sensitivity): 6 ng/L (ref <18)

## 2021-07-10 LAB — PROTIME-INR
INR: 1.1 (ref 0.8–1.2)
Prothrombin Time: 13.9 seconds (ref 11.4–15.2)

## 2021-07-10 LAB — LACTIC ACID, PLASMA: Lactic Acid, Venous: 1.3 mmol/L (ref 0.5–1.9)

## 2021-07-10 LAB — APTT: aPTT: 31 seconds (ref 24–36)

## 2021-07-10 LAB — PROCALCITONIN: Procalcitonin: 1.39 ng/mL

## 2021-07-10 MED ORDER — ENOXAPARIN SODIUM 40 MG/0.4ML IJ SOSY
40.0000 mg | PREFILLED_SYRINGE | INTRAMUSCULAR | Status: DC
  Administered 2021-07-10 – 2021-07-15 (×6): 40 mg via SUBCUTANEOUS
  Filled 2021-07-10 (×6): qty 0.4

## 2021-07-10 MED ORDER — SODIUM CHLORIDE 0.9 % IV SOLN
2.0000 g | INTRAVENOUS | Status: AC
  Administered 2021-07-10 – 2021-07-13 (×4): 2 g via INTRAVENOUS
  Filled 2021-07-10 (×4): qty 20

## 2021-07-10 MED ORDER — METHYLPREDNISOLONE SODIUM SUCC 40 MG IJ SOLR
40.0000 mg | Freq: Two times a day (BID) | INTRAMUSCULAR | Status: AC
  Administered 2021-07-10 (×2): 40 mg via INTRAVENOUS
  Filled 2021-07-10 (×2): qty 1

## 2021-07-10 MED ORDER — ACETAMINOPHEN 325 MG PO TABS: 650.0000 mg | ORAL_TABLET | Freq: Four times a day (QID) | ORAL | Status: DC | PRN

## 2021-07-10 MED ORDER — GUAIFENESIN ER 600 MG PO TB12
600.0000 mg | ORAL_TABLET | Freq: Two times a day (BID) | ORAL | Status: DC
Start: 2021-07-10 — End: 2021-07-15
  Administered 2021-07-10 – 2021-07-15 (×11): 600 mg via ORAL
  Filled 2021-07-10 (×11): qty 1

## 2021-07-10 MED ORDER — SODIUM CHLORIDE 0.9 % IV SOLN: 2.0000 g | INTRAVENOUS | Status: DC

## 2021-07-10 MED ORDER — INSULIN ASPART 100 UNIT/ML IJ SOLN
0.0000 [IU] | Freq: Every day | INTRAMUSCULAR | Status: DC
  Administered 2021-07-10: 3 [IU] via SUBCUTANEOUS
  Administered 2021-07-11: 2 [IU] via SUBCUTANEOUS
  Administered 2021-07-12: 4 [IU] via SUBCUTANEOUS
  Administered 2021-07-13: 2 [IU] via SUBCUTANEOUS
  Administered 2021-07-14: 3 [IU] via SUBCUTANEOUS
  Filled 2021-07-10 (×5): qty 1

## 2021-07-10 MED ORDER — NITROGLYCERIN 0.4 MG SL SUBL: 0.4000 mg | SUBLINGUAL_TABLET | SUBLINGUAL | Status: DC | PRN

## 2021-07-10 MED ORDER — SODIUM CHLORIDE 0.9 % IV SOLN: 500.0000 mg | INTRAVENOUS | Status: DC

## 2021-07-10 MED ORDER — METOPROLOL TARTRATE 50 MG PO TABS
50.0000 mg | ORAL_TABLET | Freq: Two times a day (BID) | ORAL | Status: DC
  Administered 2021-07-10 – 2021-07-15 (×11): 50 mg via ORAL
  Filled 2021-07-10 (×11): qty 1

## 2021-07-10 MED ORDER — IPRATROPIUM-ALBUTEROL 0.5-2.5 (3) MG/3ML IN SOLN
3.0000 mL | Freq: Four times a day (QID) | RESPIRATORY_TRACT | Status: DC
  Administered 2021-07-10 – 2021-07-13 (×13): 3 mL via RESPIRATORY_TRACT
  Filled 2021-07-10 (×13): qty 3

## 2021-07-10 MED ORDER — INSULIN GLARGINE-YFGN 100 UNIT/ML ~~LOC~~ SOLN
10.0000 [IU] | Freq: Two times a day (BID) | SUBCUTANEOUS | Status: DC
  Administered 2021-07-10 – 2021-07-15 (×11): 10 [IU] via SUBCUTANEOUS
  Filled 2021-07-10 (×12): qty 0.1

## 2021-07-10 MED ORDER — FAMOTIDINE 20 MG PO TABS
20.0000 mg | ORAL_TABLET | Freq: Every day | ORAL | Status: DC
Start: 2021-07-10 — End: 2021-07-15
  Administered 2021-07-10 – 2021-07-15 (×6): 20 mg via ORAL
  Filled 2021-07-10 (×6): qty 1

## 2021-07-10 MED ORDER — SODIUM CHLORIDE 0.9 % IV SOLN: INTRAVENOUS | Status: DC

## 2021-07-10 MED ORDER — ONDANSETRON HCL 4 MG/2ML IJ SOLN: 4.0000 mg | Freq: Four times a day (QID) | INTRAMUSCULAR | Status: DC | PRN

## 2021-07-10 MED ORDER — ASPIRIN EC 81 MG PO TBEC
81.0000 mg | DELAYED_RELEASE_TABLET | Freq: Every evening | ORAL | Status: DC
  Administered 2021-07-10 – 2021-07-14 (×5): 81 mg via ORAL
  Filled 2021-07-10 (×5): qty 1

## 2021-07-10 MED ORDER — TRAZODONE HCL 50 MG PO TABS
25.0000 mg | ORAL_TABLET | Freq: Every evening | ORAL | Status: DC | PRN
  Administered 2021-07-11 (×2): 25 mg via ORAL
  Filled 2021-07-10 (×2): qty 1

## 2021-07-10 MED ORDER — SODIUM CHLORIDE 0.9 % IV SOLN
500.0000 mg | INTRAVENOUS | Status: DC
  Administered 2021-07-10: 500 mg via INTRAVENOUS
  Filled 2021-07-10: qty 5

## 2021-07-10 MED ORDER — ACETAMINOPHEN 650 MG RE SUPP: 650.0000 mg | Freq: Four times a day (QID) | RECTAL | Status: DC | PRN

## 2021-07-10 MED ORDER — ONDANSETRON HCL 4 MG PO TABS: 4.0000 mg | ORAL_TABLET | Freq: Four times a day (QID) | ORAL | Status: DC | PRN

## 2021-07-10 MED ORDER — PREDNISONE 20 MG PO TABS
40.0000 mg | ORAL_TABLET | Freq: Every day | ORAL | Status: AC
  Administered 2021-07-11 – 2021-07-14 (×4): 40 mg via ORAL
  Filled 2021-07-10 (×4): qty 2

## 2021-07-10 MED ORDER — INSULIN ASPART 100 UNIT/ML IJ SOLN
0.0000 [IU] | Freq: Three times a day (TID) | INTRAMUSCULAR | Status: DC
  Administered 2021-07-10 – 2021-07-11 (×3): 8 [IU] via SUBCUTANEOUS
  Administered 2021-07-11: 5 [IU] via SUBCUTANEOUS
  Administered 2021-07-11: 3 [IU] via SUBCUTANEOUS
  Administered 2021-07-12: 11 [IU] via SUBCUTANEOUS
  Administered 2021-07-12: 2 [IU] via SUBCUTANEOUS
  Administered 2021-07-12: 3 [IU] via SUBCUTANEOUS
  Administered 2021-07-13: 5 [IU] via SUBCUTANEOUS
  Administered 2021-07-13: 3 [IU] via SUBCUTANEOUS
  Administered 2021-07-14: 8 [IU] via SUBCUTANEOUS
  Administered 2021-07-14 – 2021-07-15 (×4): 3 [IU] via SUBCUTANEOUS
  Filled 2021-07-10 (×16): qty 1

## 2021-07-10 MED ORDER — MONTELUKAST SODIUM 10 MG PO TABS
10.0000 mg | ORAL_TABLET | Freq: Every day | ORAL | Status: DC
  Administered 2021-07-10 – 2021-07-14 (×5): 10 mg via ORAL
  Filled 2021-07-10 (×5): qty 1

## 2021-07-10 MED ORDER — MAGNESIUM HYDROXIDE 400 MG/5ML PO SUSP: 30.0000 mL | Freq: Every day | ORAL | Status: DC | PRN

## 2021-07-10 MED ORDER — ROSUVASTATIN CALCIUM 10 MG PO TABS
40.0000 mg | ORAL_TABLET | Freq: Every day | ORAL | Status: DC
  Administered 2021-07-10 – 2021-07-15 (×6): 40 mg via ORAL
  Filled 2021-07-10 (×3): qty 4
  Filled 2021-07-10: qty 2
  Filled 2021-07-10 (×2): qty 4

## 2021-07-10 MED ORDER — POLYETHYLENE GLYCOL 3350 17 G PO PACK
17.0000 g | PACK | Freq: Every day | ORAL | Status: DC
  Administered 2021-07-12: 17 g via ORAL
  Filled 2021-07-10 (×5): qty 1

## 2021-07-10 MED ORDER — HYDROCOD POLI-CHLORPHE POLI ER 10-8 MG/5ML PO SUER: 5.0000 mL | Freq: Two times a day (BID) | ORAL | Status: DC | PRN

## 2021-07-10 MED ORDER — TIOTROPIUM BROMIDE MONOHYDRATE 18 MCG IN CAPS
18.0000 ug | ORAL_CAPSULE | Freq: Every day | RESPIRATORY_TRACT | Status: DC
  Administered 2021-07-10 – 2021-07-15 (×6): 18 ug via RESPIRATORY_TRACT
  Filled 2021-07-10 (×2): qty 5

## 2021-07-10 NOTE — ED Notes (Signed)
Patient eating dinner tray at this time. 

## 2021-07-10 NOTE — Assessment & Plan Note (Addendum)
See above

## 2021-07-10 NOTE — Assessment & Plan Note (Addendum)
Patient met sepsis criteria with leukocytosis and tachypnea, most likely secondary to pneumonia. ?Procalcitonin elevated at 1.39. ?Lactic acid within normal limit. ?COVID-19 and influenza PCR negative ?Respiratory viral panel pending. ?MRSA PCR Pending. ?-Continue with ceftriaxone and Zithromax ?-Continue with supportive care ?

## 2021-07-10 NOTE — Hospital Course (Addendum)
Taken from H&P. ? ?Bradley [name] is a 71 y.o. Caucasian male with medical history significant for COPD, coronary artery disease, type 2 diabetes mellitus and hypertension, who presented to the emergency room with acute onset of worsening dyspnea for the last 3 days with associated cough productive of clear sputum, chest tightness and wheezing.  No chest pain or palpitations.  He admitted to chills but did not check his temperature.  He has been having dry heaves.  No abdominal pain or diarrhea or melena or bright red bleeding per rectum.  No dysuria, oliguria or hematuria or flank pain.; ?  ?ED Course: Upon presentation to the ER, pulse oximetry was 9192% on room air and it came down to 88%.  He was placed on high flow nasal cannula and pulse symmetry was up to 96%.  Respiratory rate was 23 with otherwise normal vital signs. ?EKG as reviewed by me :  EKG showed sinus rhythm with a rate of 95 with right bundle branch block. ?Imaging: Portable chest ray showed advanced emphysema and patchy airspace disease in the lower lung zones that may be increased from prior exam and now concerning for pneumonia. ? ?The patient was given IV Rocephin and Zithromax as well as 3 DuoNebs and IV Solu-Medrol. ? ?5/9: Patient met sepsis criteria with leukocytosis and tachypnea, most likely secondary to pneumonia.  COVID and flu PCR negative. ?Blood cultures pending. ?Ordered respiratory viral panel, procalcitonin and MRSA PCR ?Procalcitonin elevated at 1.39. ?

## 2021-07-10 NOTE — Assessment & Plan Note (Signed)
-   We will continue statin therapy. 

## 2021-07-10 NOTE — Evaluation (Signed)
Occupational Therapy Evaluation ?Patient Details ?Name: Bradley [name] ?MRN: [id number] ?DOB: [birth year] ?Today's Date: 07/10/2021 ? ? ?History of Present Illness 71 y.o. male with medical history significant for COPD, coronary artery disease, type 2 diabetes mellitus and hypertension, who presented to the ED with acute onset of worsening dyspnea for the 3 days leading to admission with associated cough productive of clear sputum, chest tightness, wheezing and nausea.  ? ?Clinical Impression ?  ?Pt was seen for OT evaluation this date. Prior to hospital admission, pt was ambulatory without AD, mod indep with ADL and light IADL. Pt still drives but reports having to take rest breaks getting to/from his car due to becoming SOB and O2 desats. He is diligent in his pulse oximeter use to monitor his O2 sats with activity, endorsing he typically is 88-90% at rest and sometimes drops down to mid 80's with exertion. He is typically between 2.5-3L. He lives in an [place] by himself and his grandson lives next door. Pt reports family and several friends call to check up on him daily and he calls his family every night. He reports a near fall in December but his grandson caught him. ? ?Currently pt demonstrates impairments in activity tolerance and cardiopulmonary status requiring increased O2 as described below (See OT problem list) which functionally limit his ability to perform ADL/self-care tasks and IADL at baseline independence. Pt requires increased rest breaks to complete ADL. Pt educated in home/routines modifications and introduced energy conservation strategies to support improved ADL/IADL independence while minimizing over exertion and SOB. Pt verbalized understanding. RN in to take pt to his assigned room. Pt would benefit from skilled OT services to address noted impairments and functional limitations (see below for any additional details) in order to maximize safety and independence while minimizing falls risk and  caregiver burden. Upon hospital discharge, recommend HHOT to maximize pt safety and return to functional independence during meaningful occupations of daily life.   ? ?Recommendations for follow up therapy are one component of a multi-disciplinary discharge planning process, led by the attending physician.  Recommendations may be updated based on patient status, additional functional criteria and insurance authorization.  ? ?Follow Up Recommendations ? Home health OT  ?  ?Assistance Recommended at Discharge PRN  ?Patient can return home with the following A little help with bathing/dressing/bathroom;Assistance with cooking/housework;Assist for transportation;Help with stairs or ramp for entrance ? ?  ?Functional Status Assessment ? Patient has had a recent decline in their functional status and demonstrates the ability to make significant improvements in function in a reasonable and predictable amount of time.  ?Equipment Recommendations ? Tub/shower seat  ?  ?Recommendations for Other Services   ? ? ?  ?Precautions / Restrictions Precautions ?Precautions: None ?Restrictions ?Weight Bearing Restrictions: No  ? ?  ? ?Mobility Bed Mobility ?Overal bed mobility: Modified Independent ?  ?  ?  ?  ?  ?  ?  ?  ? ?Transfers ?Overall transfer level: Modified independent ?Equipment used: None ?  ?  ?  ?  ?  ?  ?  ?  ?  ? ?  ?Balance Overall balance assessment: Modified Independent ?  ?  ?  ?  ?  ?  ?  ?  ?  ?  ?  ?  ?  ?  ?  ?  ?  ?  ?   ? ?ADL either performed or assessed with clinical judgement  ? ?ADL Overall ADL's : Modified independent ?  ?  ?  ?  ?  ?  ?  ?  ?  ?  ?  ?  ?  ?  ?  ?  ?  ?  ?  ?  General ADL Comments: Increased WOB and desats with exertion requiring rest breaks.  ? ? ? ?Vision   ?   ?   ?Perception   ?  ?Praxis   ?  ? ?Pertinent Vitals/Pain Pain Assessment ?Pain Assessment: No/denies pain  ? ? ? ?Hand Dominance   ?  ?Extremity/Trunk Assessment Upper Extremity Assessment ?Upper Extremity Assessment: Overall  WFL for tasks assessed;Generalized weakness ?  ?Lower Extremity Assessment ?Lower Extremity Assessment: Generalized weakness;Overall [facility] for tasks assessed ?  ?  ?  ?Communication Communication ?Communication: No difficulties ?  ?Cognition Arousal/Alertness: Awake/alert ?Behavior During Therapy: [facility] for tasks assessed/performed ?Overall Cognitive Status: Within Functional Limits for tasks assessed ?  ?  ?  ?  ?  ?  ?  ?  ?  ?  ?  ?  ?  ?  ?  ?  ?  ?  ?  ?General Comments    ? ?  ?Exercises Other Exercises ?Other Exercises: Pt educated in home/routines modifications and introduced energy conservation strategies to support improved ADL/IADL independence while minimizing over exertion and SOB. ?  ?Shoulder Instructions    ? ? ?Home Living Family/patient expects to be discharged to:: Private residence ?Living Arrangements: Alone ?Available Help at Discharge: Family (son lives next door and checks in QD) ?Type of Home: House ?Home Access: Stairs to enter ?Entrance Stairs-Number of Steps: 3 ?Entrance Stairs-Rails: Left ?Home Layout: One level ?  ?  ?Bathroom Shower/Tub: Walk-in shower ?  ?Bathroom Toilet: Standard ?  ?  ?Home Equipment: Wheelchair - manual;Cane - single point (on 2-3 L 24/7, has pulse oximeter) ?  ?  ?  ? ?  ?Prior Functioning/Environment Prior Level of Function : Needs assist;Driving ?  ?  ?  ?  ?  ?  ?Mobility Comments: reports he rarely walks more than 50 ft at a time, son does his laundry and heavier errands ?ADLs Comments: Mod indep with ADL (wipes bath), drives and [facility] up meds from [facility] (can't park close, difficult for him to do), good handle on O2 mgt and keeps pulse ox in his pocket to keep track of O2 sats ?  ? ?  ?  ?OT Problem List: Decreased activity tolerance;Decreased knowledge of use of DME or AE;Cardiopulmonary status limiting activity ?  ?   ?OT Treatment/Interventions: Self-care/ADL training;Therapeutic exercise;Therapeutic activities;DME and/or AE instruction;Energy  conservation;Patient/family education  ?  ?OT Goals(Current goals can be found in the care plan section) Acute Rehab OT Goals ?Patient Stated Goal: breathe better and go home ?OT Goal Formulation: With patient ?Time For Goal Achievement: 07/24/21 ?Potential to Achieve Goals: Good  ?OT Frequency: Min 2X/week ?  ? ?Co-evaluation   ?  ?  ?  ?  ? ?  ?AM-PAC OT "6 Clicks" Daily Activity     ?Outcome Measure Help from another person eating meals?: None ?Help from another person taking care of personal grooming?: None ?Help from another person toileting, which includes using toliet, bedpan, or urinal?: A Little ?Help from another person bathing (including washing, rinsing, drying)?: A Little ?Help from another person to put on and taking off regular upper body clothing?: None ?Help from another person to put on and taking off regular lower body clothing?: A Little ?6 Click Score: 21 ?  ?End of Session Equipment Utilized During Treatment: Oxygen ? ?Activity Tolerance: Patient tolerated treatment well ?Patient left: in bed;with call bell/phone within reach ? ?OT Visit Diagnosis: Other abnormalities of gait and mobility (R26.89)  ?              ?  Time: 4461-9012 ?OT Time Calculation (min): 15 min ?Charges:  OT General Charges ?$OT Visit: 1 Visit ?OT Evaluation ?$OT Eval Low Complexity: 1 Low ?OT Treatments ?$Self Care/Home Management : 8-22 mins ? ?Ardeth Perfect., MPH, MS, OTR/L ?ascom [phone] ?07/10/21, 5:00 PM ?

## 2021-07-10 NOTE — Assessment & Plan Note (Addendum)
Patient was wheezing on arrival to ED.  Received Solu-Medrol and DuoNeb ?Most likely secondary to pneumonia. ?Checking respiratory viral panel. ?-Continue with steroid ?-Continue with DuoNeb ?-Continue with as needed bronchodilators ?-Continue supplemental oxygen. ?

## 2021-07-10 NOTE — Assessment & Plan Note (Addendum)
Pressure within goal.  Patient is just on metoprolol at home. ?-Continue with metoprolol ?

## 2021-07-10 NOTE — Progress Notes (Signed)
Inpatient Diabetes Program Recommendations ? ?AACE/ADA: New Consensus Statement on Inpatient Glycemic Control (2015) ? ?Target Ranges:  Prepandial:   less than 140 mg/dL ?     Peak postprandial:   less than 180 mg/dL (1-2 hours) ?     Critically ill patients:  140 - 180 mg/dL  ? ? Latest Reference Range & Units 07/09/21 20:33 07/10/21 06:05  ?Glucose 70 - 99 mg/dL 190 (H) ? ?125 mg Solumedrol @2103  325 (H)  ?(H): Data is abnormally high ? ? ? ?Admit with: COPD/ Pneumonia ? ?History: DM2 ? ?Home DM Meds: Metformin 750 mg BID ? ?Current Orders: None yet ? ? ? ?MD- Note pt with History Diabetes.   ? ?Getting Solumedrol 40 mg BID--To switch to Prednisone tomorrow 5/10 ? ?Please start Novolog Moderate Correction Scale/ SSI (0-15 units) TID AC + HS ? ? ?--Will follow patient during hospitalization-- ? ?Wyn Quaker RN, MSN, CDE ?Diabetes Coordinator ?Inpatient Glycemic Control Team ?Team Pager: [phone] (8a-5p) ? ? ? ?

## 2021-07-10 NOTE — Assessment & Plan Note (Addendum)
Patient was on metformin at home. ?CBG elevated-patient is now on steroid. ?-Add Semglee 10 units twice daily ?-Moderate sliding scale ?

## 2021-07-10 NOTE — TOC Initial Note (Signed)
Transition of Care (TOC) - Initial/Assessment Note  ? ? ?Patient Details  ?Name: Bradley [name] ?MRN: [id number] ?Date of Birth: 09-04-1950 ? ?Transition of Care (TOC) CM/SW Contact:    ?Candie Chroman, LCSW ?Phone Number: ?07/10/2021, 11:01 AM ? ?Clinical Narrative:   Readmission prevention screen complete. CSW met with patient. No supports at bedside. CSW introduced role and explained that discharge planning would be discussed. PCP is Johny Drilling, MD at [facility]. Patient drives himself to appointments. Pharmacy is [facility] in [place]. No issues obtaining medications. He just started having a RN from [facility] check on him 2-3 weeks ago. No DME use to get around prior to admission. He is on home oxygen. Per chart he is on 2.5-4 L. He thinks his oxygen is supplied through [place]. Left message for [employer] representative to confirm. His daughter [name] will likely drive him home at discharge. No further concerns. CSW encouraged patient to contact CSW as needed. CSW will continue to follow patient for support and facilitate return home when stable. ? ?Expected Discharge Plan: Home/Self Care ?Barriers to Discharge: Continued Medical Work up ? ? ?Patient Goals and CMS Choice ?  ?  ?  ? ?Expected Discharge Plan and Services ?Expected Discharge Plan: Home/Self Care ?  ?  ?Post Acute Care Choice: NA ?Living arrangements for the past 2 months: [place] ?                ?  ?  ?  ?  ?  ?  ?  ?  ?  ?  ? ?Prior Living Arrangements/Services ?Living arrangements for the past 2 months: [place] ?  ?Patient language and need for interpreter reviewed:: Yes ?Do you feel safe going back to the place where you live?: Yes      ?Need for Family Participation in Patient Care: Yes (Comment) ?  ?  ?Criminal Activity/Legal Involvement Pertinent to Current Situation/Hospitalization: No - Comment as needed ? ?Activities of Daily Living ?  ?  ? ?Permission Sought/Granted ?  ?  ?   ?   ?   ?   ? ?Emotional  Assessment ?Appearance:: Appears stated age ?Attitude/Demeanor/Rapport: Engaged, Gracious ?Affect (typically observed): Accepting, Appropriate, Calm, Pleasant ?Orientation: : Oriented to Self, Oriented to Place, Oriented to  Time, Oriented to Situation ?Alcohol / Substance Use: Not Applicable ?Psych Involvement: No (comment) ? ?Admission diagnosis:  COPD exacerbation ([place]) [J44.1] ?Patient Active Problem List  ? Diagnosis Date Noted  ? Sepsis due to pneumonia ([place]) 07/10/2021  ? Type 2 diabetes mellitus without complications ([place]) 82/95/6213  ? Dyslipidemia 07/10/2021  ? Essential hypertension 07/10/2021  ? Severe sepsis ([place]) 03/03/2021  ? Multifocal pneumonia 03/02/2021  ? Sepsis ([place]) 02/05/2021  ? History of tobacco use disorder 02/05/2021  ? Alcohol use disorder, moderate, dependence ([place]) 02/05/2021  ? Acute on chronic respiratory failure ([place]) 07/30/2020  ? Acute on chronic respiratory failure with hypoxia ([place]) 07/29/2020  ? CKD (chronic kidney disease) stage 3, GFR 30-59 ml/min (HCC) 07/29/2020  ? Hypertension associated with diabetes ([place]) 07/29/2020  ? Hyperlipidemia associated with type 2 diabetes mellitus ([place]) 07/29/2020  ? COVID-19 virus infection 07/29/2020  ? Callus 07/13/2020  ? Pain due to onychomycosis of toenails of both feet 01/06/2020  ? Type 2 diabetes mellitus with vascular disease ([place]) 01/06/2020  ? Chronic renal failure, stage 2 (mild) 12/01/2019  ? Lightheadedness 11/16/2019  ? Acute kidney injury ([place]) 04/20/2019  ? PAD (peripheral artery disease) ([place]) 04/20/2019  ?  Tachycardia 04/20/2019  ? B12 deficiency 12/07/2017  ? OSA (obstructive sleep apnea) 11/28/2017  ? Lower extremity pain, left 03/06/2017  ? Numbness and tingling of left leg 03/06/2017  ? Coronary artery disease involving native coronary artery of native heart without angina pectoris 02/18/2017  ? Diastasis recti 02/18/2017  ? CVD (cardiovascular disease) 02/18/2017  ? Need for vaccination 02/18/2017  ? Umbilical hernia  without obstruction and without gangrene 02/18/2017  ? Hemoglobinuria 08/21/2016  ? Anemia 08/07/2016  ? Pneumonia 07/15/2016  ? Lung mass 06/28/2016  ? History of cigarette smoking 06/19/2016  ? Screening for malignant neoplasm of respiratory organ 06/19/2016  ? Acute respiratory failure with hypoxia ([place]) 12/27/2015  ? Community acquired pneumonia 12/27/2015  ? Acute respiratory distress 09/19/2015  ? Adjustment disorder with mixed anxiety and depressed mood 04/14/2015  ? Insomnia 04/14/2015  ? Grief 04/14/2015  ? Hyponatremia 04/12/2015  ? COPD exacerbation ([place]) 10/08/2014  ? CAP (community acquired pneumonia) 10/08/2014  ? Hypogammaglobulinemia ([place]) 08/17/2014  ? Leukocytosis 08/03/2014  ? Recurrent infections 08/03/2014  ? Tinnitus of right ear 03/03/2014  ? Left carotid artery stenosis 07/21/2013  ? HLD (hyperlipidemia) 10/05/2010  ? Onychomycosis 10/05/2010  ? ?PCP:  Valera Castle, MD ?Pharmacy:   ?[facility] [place] [facility] [address] ?[place] [address] ?Phone: [phone] Fax: [phone] ? ? ? ? ?Social Determinants of Health (SDOH) Interventions ?  ? ?Readmission Risk Interventions ? ?  02/07/2021  ?  3:13 PM 07/31/2020  ? 10:04 AM  ?Readmission Risk Prevention Plan  ?Transportation Screening Complete Complete  ?PCP or Specialist Appt within 3-5 Days Complete Complete  ?[place] or Home Care Consult Complete Complete  ?Social Work Consult for [place] Planning/Counseling Complete Not Complete  ?SW consult not completed comments  RNCM assigned to patient  ?Palliative Care Screening Not Applicable Not Applicable  ?Medication Review Press photographer) Complete Complete  ? ? ? ?

## 2021-07-10 NOTE — Assessment & Plan Note (Addendum)
Acute on chronic hypoxic respiratory failure, baseline oxygen use of around 3 L.  2.5 to 4 L was mention in his chart.  Currently on 10 L of oxygen. ?-Continue with oxygen-wean to baseline as tolerated ?

## 2021-07-10 NOTE — Evaluation (Signed)
Physical Therapy Evaluation ?Patient Details ?Name: Bradley [name] ?MRN: [id number] ?DOB: 05/07/1950 ?Today's Date: 07/10/2021 ? ?History of Present Illness ? 71 y.o. male with medical history significant for COPD, coronary artery disease, type 2 diabetes mellitus and hypertension, who presented to the ED with acute onset of worsening dyspnea for the 3 days leading to admission with associated cough productive of clear sputum, chest tightness, wheezing and nausea.  ?Clinical Impression ? Pt showed good safety and confidence with bed mobility/transfers.  He also did relatively well with ~50 ft of in-room ambulation w/o AD.  He reports that he feels relatively close to baseline as far as his mobility/function is concerned, but he is needing 15L of O2 just to sustain sats in the 90s and he did drop to 86% after relatively modest bout of ambulation with significant subjective fatigue.   ?   ? ?Recommendations for follow up therapy are one component of a multi-disciplinary discharge planning process, led by the attending physician.  Recommendations may be updated based on patient status, additional functional criteria and insurance authorization. ? ?Follow Up Recommendations Home health PT ? ?  ?Assistance Recommended at Discharge PRN  ?Patient can return home with the following ? Assistance with cooking/housework;Assist for transportation ? ?  ?Equipment Recommendations None recommended by PT  ?Recommendations for Other Services ?    ?  ?Functional Status Assessment Patient has had a recent decline in their functional status and demonstrates the ability to make significant improvements in function in a reasonable and predictable amount of time.  ? ?  ?Precautions / Restrictions Precautions ?Precautions: None ?Restrictions ?Weight Bearing Restrictions: No  ? ?  ? ?Mobility ? Bed Mobility ?Overal bed mobility: Modified Independent ?  ?  ?  ?  ?  ?  ?General bed mobility comments: easily gets to sitting EOB w/o assist ?   ? ?Transfers ?Overall transfer level: Modified independent ?Equipment used: None ?  ?  ?  ?  ?  ?  ?  ?General transfer comment: Able to rise to standing w/o hesitation or safety concern, no need for UE support ?  ? ?Ambulation/Gait ?  ?Gait Distance (Feet): 55 Feet ?Assistive device: None ?  ?  ?  ?  ?General Gait Details: Pt on 15L O2 t/o the ambulation effort, able to ambualte with safe and consistent cadence - starts to have increasing fatigue with slowly dropping O2 sats (as low as 86% on return to bed with focused breathing).  Pt with no LOBs or overt unsteadiness during in-room ambulation and subjectively states he's not too far from his baseline - apart from obviously not requiring as much O2 his actual tolerance for 50+ ft of ambulation is limited at baseline anyway ? ?Stairs ?  ?  ?  ?  ?  ? ?Wheelchair Mobility ?  ? ?Modified Rankin (Stroke Patients Only) ?  ? ?  ? ?Balance Overall balance assessment: Modified Independent ?  ?Sitting balance-Leahy Scale: Good ?  ?  ?  ?Standing balance-Leahy Scale: Good ?Standing balance comment: Pt did not need to hold with UEs during standing/ambulation, but reports that he will often reach toward funiture if eneded, did not feel far from his baseline apart from breathing ?  ?  ?  ?  ?  ?  ?  ?  ?  ?  ?  ?   ? ? ? ?Pertinent Vitals/Pain Pain Assessment ?Pain Assessment: No/denies pain  ? ? ?Home Living Family/patient expects to be discharged to:: Private  residence ?Living Arrangements: Alone ?Available Help at Discharge: Family (son lives next door and checks in QD) ?Type of Home: House ?Home Access: Stairs to enter ?Entrance Stairs-Rails: Left ?Entrance Stairs-Number of Steps: 3 ?  ?Home Layout: One level ?Home Equipment: Wheelchair - manual;Cane - single point (on 2-3 L 24/7, has pulse oximeter) ?   ?  ?Prior Function Prior Level of Function : Needs assist ?  ?  ?  ?  ?  ?  ?Mobility Comments: reports he rarely walks more than 50 ft at a time, son does his laundry  and heavier errands ?  ?  ? ? ?Hand Dominance  ?   ? ?  ?Extremity/Trunk Assessment  ? Upper Extremity Assessment ?Upper Extremity Assessment: Overall WFL for tasks assessed;Generalized weakness ?  ? ?Lower Extremity Assessment ?Lower Extremity Assessment: Overall WFL for tasks assessed;Generalized weakness ?  ? ?   ?Communication  ? Communication: No difficulties  ?Cognition Arousal/Alertness: Awake/alert ?Behavior During Therapy: [facility] for tasks assessed/performed ?Overall Cognitive Status: Within Functional Limits for tasks assessed ?  ?  ?  ?  ?  ?  ?  ?  ?  ?  ?  ?  ?  ?  ?  ?  ?  ?  ?  ? ?  ?General Comments   ? ?  ?Exercises    ? ?Assessment/Plan  ?  ?PT Assessment Patient needs continued PT services  ?PT Problem List Decreased strength;Decreased range of motion;Decreased activity tolerance;Decreased balance;Decreased mobility;Decreased knowledge of use of DME;Decreased safety awareness;Decreased coordination ? ?   ?  ?PT Treatment Interventions DME instruction;Gait training;Stair training;Functional mobility training;Therapeutic activities;Therapeutic exercise;Balance training;Patient/family education   ? ?PT Goals (Current goals can be found in the Care Plan section)  ?Acute Rehab PT Goals ?Patient Stated Goal: Go home ?PT Goal Formulation: With patient ?Time For Goal Achievement: 07/24/21 ?Potential to Achieve Goals: Good ? ?  ?Frequency Min 2X/week ?  ? ? ?Co-evaluation   ?  ?  ?  ?  ? ? ?  ?AM-PAC PT "6 Clicks" Mobility  ?Outcome Measure Help needed turning from your back to your side while in a flat bed without using bedrails?: None ?Help needed moving from lying on your back to sitting on the side of a flat bed without using bedrails?: None ?Help needed moving to and from a bed to a chair (including a wheelchair)?: A Little ?Help needed standing up from a chair using your arms (e.g., wheelchair or bedside chair)?: A Little ?Help needed to walk in hospital room?: A Little ?Help needed climbing 3-5 steps  with a railing? : A Little ?6 Click Score: 20 ? ?  ?End of Session Equipment Utilized During Treatment: Gait belt;Oxygen (15L humidified) ?Activity Tolerance: Patient limited by fatigue ?Patient left: in bed;with call bell/phone within reach ?Nurse Communication: Mobility status ?PT Visit Diagnosis: Muscle weakness (generalized) (M62.81);Difficulty in walking, not elsewhere classified (R26.2);Unsteadiness on feet (R26.81) ?  ? ?Time: 2800-3491 ?PT Time Calculation (min) (ACUTE ONLY): 22 min ? ? ?Charges:   PT Evaluation ?$PT Eval Low Complexity: 1 Low ?PT Treatments ?$Gait Training: 8-22 mins ?  ?   ? ? ?Kreg Shropshire, DPT ?07/10/2021, 12:38 PM ? ?

## 2021-07-10 NOTE — H&P (Signed)
?  ?  ?[place] ? ? ?PATIENT NAME: Bradley [name]   ? ?MR#:  [id number] ? ?DATE OF BIRTH:  [birth year] ? ?DATE OF ADMISSION:  07/09/2021 ? ?PRIMARY CARE PHYSICIAN: Valera Castle, MD  ? ?Patient is coming from: Home ? ?REQUESTING/REFERRING PHYSICIAN: Nance Pear, MD ? ?CHIEF COMPLAINT:  ? ?Chief Complaint  ?Patient presents with  ? Shortness of Breath  ? ? ?HISTORY OF PRESENT ILLNESS:  ?Bradley [name] is a 71 y.o. Caucasian male with medical history significant for COPD, coronary artery disease, type 2 diabetes mellitus and hypertension, who presented to the emergency room with acute onset of worsening dyspnea for the last 3 days with associated cough productive of clear sputum, chest tightness and wheezing.  No chest pain or palpitations.  He admitted to chills but did not check his temperature.  He has been having dry heaves.  No abdominal pain or diarrhea or melena or bright red bleeding per rectum.  No dysuria, oliguria or hematuria or flank pain.; ? ?ED Course: Upon presentation to the ER, pulse oximetry was 9192% on room air and it came down to 88%.  He was placed on high flow nasal cannula and pulse symmetry was up to 96%.  Respiratory rate was 23 with otherwise normal vital signs. ?EKG as reviewed by me :  EKG showed sinus rhythm with a rate of 95 with right bundle branch block. ?Imaging: Portable chest ray showed advanced emphysema and patchy airspace disease in the lower lung zones that may be increased from prior exam and not concerning for pneumonia. ? ?The patient was given IV Rocephin and Zithromax as well as 3 DuoNebs and IV Solu-Medrol.  He will be admitted to a progressive unit bed for further evaluation and management. ?PAST MEDICAL HISTORY:  ? ?Past Medical History:  ?Diagnosis Date  ? COPD (chronic obstructive pulmonary disease) ([place])   ? Coronary artery disease   ? Diabetes mellitus without complication ([place])   ? Hypertension   ? ? ?PAST SURGICAL HISTORY:  ? ?Past Surgical History:   ?Procedure Laterality Date  ? CAROTID STENT Left   ? LEFT HEART CATH AND CORONARY ANGIOGRAPHY Left 11/02/2019  ? Procedure: LEFT HEART CATH AND CORONARY ANGIOGRAPHY;  Surgeon: Teodoro Spray, MD;  Location: [facility];  Service: Cardiovascular;  Laterality: Left;  ? LUNG BIOPSY Right   ? 2006  ? right side partial lobe removed     ? ? ?SOCIAL HISTORY:  ? ?Social History  ? ?Tobacco Use  ? Smoking status: Former  ?  Packs/day: 1.00  ?  Years: 52.00  ?  Pack years: 52.00  ?  Types: Cigarettes  ?  Quit date: 06/18/2016  ?  Years since quitting: 5.0  ? Smokeless tobacco: Never  ?Substance Use Topics  ? Alcohol use: Yes  ?  Alcohol/week: 4.0 standard drinks  ?  Types: 4 Shots of liquor per week  ?  Comment: has rum every other day  ? ? ?FAMILY HISTORY:  ? ?Family History  ?Problem Relation Age of Onset  ? Cancer Mother   ? Heart attack Father   ? Cancer Sister   ? ? ?DRUG ALLERGIES:  ? ?Allergies  ?Allergen Reactions  ? Ace Inhibitors Other (See Comments)  ?  Hypotension  ? ? ?REVIEW OF SYSTEMS:  ? ?ROS ?As per history of present illness. All pertinent systems were reviewed above. Constitutional, HEENT, cardiovascular, respiratory, GI, GU, musculoskeletal, neuro, psychiatric, endocrine, integumentary and hematologic systems were reviewed  and are otherwise negative/unremarkable except for positive findings mentioned above in the HPI. ? ? ?MEDICATIONS AT HOME:  ? ?Prior to Admission medications   ?Medication Sig Start Date End Date Taking? Authorizing Provider  ?albuterol (VENTOLIN HFA) 108 (90 Base) MCG/ACT inhaler SMARTSIG:2 Puff(s) By Mouth Every 6 Hours PRN 06/25/21  Yes [provider]  ?aspirin EC 81 MG tablet Take 81 mg by mouth every evening.    Yes [provider]  ?famotidine (PEPCID) 20 MG tablet Take 20 mg by mouth daily.   Yes [provider]  ?Fluticasone-Salmeterol (ADVAIR) 250-50 MCG/DOSE AEPB Inhale 1 puff into the lungs 2 (two) times daily. 09/20/15  Yes Sudini, Alveta Heimlich,  MD  ?ipratropium-albuterol (DUONEB) 0.5-2.5 (3) MG/3ML SOLN Take 3 mLs by nebulization every 4 (four) hours as needed. 03/06/21  Yes Hosie Poisson, MD  ?levalbuterol Penne Lash) 0.31 MG/3ML nebulizer solution Take 1 ampule by nebulization every 4 (four) hours as needed for wheezing.   Yes [provider]  ?metFORMIN (GLUCOPHAGE-XR) 750 MG 24 hr tablet Take 750 mg by mouth 2 (two) times daily.   Yes [provider]  ?metoprolol tartrate (LOPRESSOR) 50 MG tablet Take 50 mg by mouth 2 (two) times daily. 11/21/19  Yes [provider]  ?montelukast (SINGULAIR) 10 MG tablet Take 10 mg by mouth at bedtime. 01/09/21  Yes [provider]  ?polyethylene glycol (MIRALAX / GLYCOLAX) 17 g packet Take 17 g by mouth daily.   Yes [provider]  ?rosuvastatin (CRESTOR) 40 MG tablet Take 40 mg by mouth daily.  08/05/19  Yes [provider]  ?sildenafil (REVATIO) 20 MG tablet Take 20-100 mg by mouth as needed.   Yes [provider]  ?tiotropium (SPIRIVA) 18 MCG inhalation capsule Place 18 mcg into inhaler and inhale daily.   Yes [provider]  ?nitroGLYCERIN (NITROSTAT) 0.4 MG SL tablet Place 0.4 mg under the tongue every 5 (five) minutes x 3 doses as needed for chest pain.     [provider]  ? ?  ? ?VITAL SIGNS:  ?Blood pressure 106/74, pulse 97, temperature 98.4 ?F (36.9 ?C), temperature source Oral, resp. rate (!) 25, height 5\' 7"  (1.702 m), weight 71.2 kg, SpO2 94 %. ? ?PHYSICAL EXAMINATION:  ?Physical Exam ? ?GENERAL:  71 y.o.-year-old Caucasian male patient lying in the bed with moderate distress with conversational dyspnea.  ?EYES: Pupils equal, round, reactive to light and accommodation. No scleral icterus. Extraocular muscles intact.  ?HEENT: Head atraumatic, normocephalic. Oropharynx and nasopharynx clear.  ?NECK:  Supple, no jugular venous distention. No thyroid enlargement, no tenderness.  ?LUNGS: Diminished bibasilar breath sounds with bibasal  crackles and expiratory wheezes with diminished expiratory airflow and harsh vesicular breathing.  No use of accessory muscles of respiration.  ?CARDIOVASCULAR: Regular rate and rhythm, S1, S2 normal. No murmurs, rubs, or gallops.  ?ABDOMEN: Soft, nondistended, nontender. Bowel sounds present. No organomegaly or mass.  ?EXTREMITIES: No pedal edema, cyanosis, or clubbing.  ?NEUROLOGIC: Cranial nerves II through XII are intact. Muscle strength 5/5 in all extremities. Sensation intact. Gait not checked.  ?PSYCHIATRIC: The patient is alert and oriented x 3.  Normal affect and good eye contact. ?SKIN: No obvious rash, lesion, or ulcer.  ? ?LABORATORY PANEL:  ? ?CBC ?Recent Labs  ?Lab 07/09/21 ?2033  ?WBC 18.1*  ?HGB 14.4  ?HCT 45.7  ?PLT 277  ? ?------------------------------------------------------------------------------------------------------------------ ? ?Chemistries  ?Recent Labs  ?Lab 07/09/21 ?2033  ?NA 134*  ?K 4.4  ?CL 107  ?CO2 20*  ?  GLUCOSE 190*  ?BUN 20  ?CREATININE 1.05  ?CALCIUM 9.1  ? ?------------------------------------------------------------------------------------------------------------------ ? ?Cardiac Enzymes ?No results for input(s): TROPONINI in the last 168 hours. ?------------------------------------------------------------------------------------------------------------------ ? ?RADIOLOGY:  ?DG Chest 1 View ? ?Result Date: 07/09/2021 ?CLINICAL DATA:  Shortness of breath. EXAM: CHEST  1 VIEW COMPARISON:  Most recent radiograph 03/01/2021, CT 02/05/2021 FINDINGS: Advanced emphysema. Fiducial markers within a right perihilar density. There is crowding of basilar bronchovascular markings, however patchy airspace disease in both lower lung zones may be increased from prior exam. Blunting of the costophrenic angles, felt to be related to hyperinflation rather than pleural effusions. Biapical pleuroparenchymal scarring. Stable osseous structures. IMPRESSION: Advanced emphysema. Patchy airspace  disease in both lower lung zones may be increased from prior exam, suspicious for pneumonia. Electronically Signed   By: Keith Rake M.D.   On: 07/09/2021 21:44   ? ? ? ?IMPRESSION AND PLAN:  ?Assessment and P

## 2021-07-10 NOTE — Progress Notes (Signed)
?Progress Note ? ? ?Patient: Bradley [name] BEE:[id number] DOB: Jul 14, 1950 DOA: 07/09/2021     1 ?DOS: the patient was seen and examined on 07/10/2021 ?  ?Brief hospital course: ?Taken from H&P. ? ?Bradley [name] is a 71 y.o. Caucasian male with medical history significant for COPD, coronary artery disease, type 2 diabetes mellitus and hypertension, who presented to the emergency room with acute onset of worsening dyspnea for the last 3 days with associated cough productive of clear sputum, chest tightness and wheezing.  No chest pain or palpitations.  He admitted to chills but did not check his temperature.  He has been having dry heaves.  No abdominal pain or diarrhea or melena or bright red bleeding per rectum.  No dysuria, oliguria or hematuria or flank pain.; ?  ?ED Course: Upon presentation to the ER, pulse oximetry was 9192% on room air and it came down to 88%.  He was placed on high flow nasal cannula and pulse symmetry was up to 96%.  Respiratory rate was 23 with otherwise normal vital signs. ?EKG as reviewed by me :  EKG showed sinus rhythm with a rate of 95 with right bundle branch block. ?Imaging: Portable chest ray showed advanced emphysema and patchy airspace disease in the lower lung zones that may be increased from prior exam and now concerning for pneumonia. ? ?The patient was given IV Rocephin and Zithromax as well as 3 DuoNebs and IV Solu-Medrol. ? ?5/9: Patient met sepsis criteria with leukocytosis and tachypnea, most likely secondary to pneumonia.  COVID and flu PCR negative. ?Blood cultures pending. ?Ordered respiratory viral panel, procalcitonin and MRSA PCR ? ? ?Assessment and Plan: ?* Sepsis due to pneumonia [facility]) ?Patient met sepsis criteria with leukocytosis and tachypnea, most likely secondary to pneumonia. ?Procalcitonin elevated at 1.39. ?Lactic acid within normal limit. ?COVID-19 and influenza PCR negative ?Respiratory viral panel pending. ?MRSA PCR Pending. ?-Continue with ceftriaxone and  Zithromax ?-Continue with supportive care ? ?Community acquired pneumonia ?- See above ? ?COPD exacerbation ([place]) ?Patient was wheezing on arrival to ED.  Received Solu-Medrol and DuoNeb ?Most likely secondary to pneumonia. ?Checking respiratory viral panel. ?-Continue with steroid ?-Continue with DuoNeb ?-Continue with as needed bronchodilators ?-Continue supplemental oxygen. ? ?Acute respiratory failure with hypoxia ([place]) ?Acute on chronic hypoxic respiratory failure, baseline oxygen use of around 3 L.  2.5 to 4 L was mention in his chart.  Currently on 10 L of oxygen. ?-Continue with oxygen-wean to baseline as tolerated ? ?Essential hypertension ?Pressure within goal.  Patient is just on metoprolol at home. ?-Continue with metoprolol ? ?Dyslipidemia ?We will continue statin therapy. ? ?Type 2 diabetes mellitus without complications ([place]) ?Patient was on metformin at home. ?CBG elevated-patient is now on steroid. ?-Add Semglee 10 units twice daily ?-Moderate sliding scale ? ? ?Subjective: Patient continued to feel short of breath, uses 3 to 4 L of oxygen at home.  Currently on 10 L, saturating in mid 90s. ? ?Physical Exam: ?Vitals:  ? 07/10/21 1115 07/10/21 1130 07/10/21 1145 07/10/21 1200  ?BP:  113/67  119/67  ?Pulse: 92 90 92 84  ?Resp: _0 (!) 23  ?Temp:      ?TempSrc:      ?SpO2: 96% 96% 94% 99%  ?Weight:      ?Height:      ? ?General.  Frail elderly man, in no acute distress. ?Pulmonary.  Decreased breath sounds, no wheezing, normal respiratory effort. ?CV.  Regular rate and rhythm, no JVD, rub  or murmur. ?Abdomen.  Soft, nontender, nondistended, BS positive. ?CNS.  Alert and oriented .  No focal neurologic deficit. ?Extremities.  No edema, no cyanosis, pulses intact and symmetrical. ?Psychiatry.  Judgment and insight appears normal. ? ?Data Reviewed: ?Prior notes, labs and images reviewed ? ?Family Communication:  ? ?Disposition: ?Status is: Inpatient ?Remains inpatient appropriate because: Talked with  daughter on phone ? ? Planned Discharge Destination: Home ? ?DVT prophylaxis.  Lovenox ?Time spent: 50 minutes ? ?This record has been created using Systems analyst. Errors have been sought and corrected,but may not always be located. Such creation errors do not reflect on the standard of care. ? ?Author: ?Lorella Nimrod, MD ?07/10/2021 1:38 PM ? ?For on call review [email or web address].  ?

## 2021-07-11 DIAGNOSIS — J189 Pneumonia, unspecified organism: Secondary | ICD-10-CM | POA: Diagnosis not present

## 2021-07-11 DIAGNOSIS — A419 Sepsis, unspecified organism: Secondary | ICD-10-CM | POA: Diagnosis not present

## 2021-07-11 DIAGNOSIS — E440 Moderate protein-calorie malnutrition: Secondary | ICD-10-CM | POA: Insufficient documentation

## 2021-07-11 DIAGNOSIS — E43 Unspecified severe protein-calorie malnutrition: Secondary | ICD-10-CM | POA: Insufficient documentation

## 2021-07-11 LAB — BASIC METABOLIC PANEL
Anion gap: 4 — ABNORMAL LOW (ref 5–15)
BUN: 24 mg/dL — ABNORMAL HIGH (ref 8–23)
CO2: 26 mmol/L (ref 22–32)
Calcium: 9 mg/dL (ref 8.9–10.3)
Chloride: 106 mmol/L (ref 98–111)
Creatinine, Ser: 1.02 mg/dL (ref 0.61–1.24)
GFR, Estimated: >60 mL/min (ref >60)
Glucose, Bld: 308 mg/dL — ABNORMAL HIGH (ref 70–99)
Potassium: 4.9 mmol/L (ref 3.5–5.1)
Sodium: 136 mmol/L (ref 135–145)

## 2021-07-11 LAB — CBC
HCT: 40.1 % (ref 39.0–52.0)
Hemoglobin: 12.6 g/dL — ABNORMAL LOW (ref 13.0–17.0)
MCH: 26.6 pg (ref 26.0–34.0)
MCHC: 31.4 g/dL (ref 30.0–36.0)
MCV: 84.8 fL (ref 80.0–100.0)
Platelets: 252 10*3/uL (ref 150–400)
RBC: 4.73 MIL/uL (ref 4.22–5.81)
RDW: 15.1 % (ref 11.5–15.5)
WBC: 14.5 10*3/uL — ABNORMAL HIGH (ref 4.0–10.5)
nRBC: 0 % (ref 0.0–0.2)

## 2021-07-11 LAB — GLUCOSE, CAPILLARY
Glucose-Capillary: 248 mg/dL — ABNORMAL HIGH (ref 70–99)
Glucose-Capillary: 284 mg/dL — ABNORMAL HIGH (ref 70–99)
Glucose-Capillary: 195 mg/dL — ABNORMAL HIGH (ref 70–99)
Glucose-Capillary: 249 mg/dL — ABNORMAL HIGH (ref 70–99)

## 2021-07-11 LAB — MAGNESIUM: Magnesium: 2.2 mg/dL (ref 1.7–2.4)

## 2021-07-11 LAB — PHOSPHORUS: Phosphorus: 2.6 mg/dL (ref 2.5–4.6)

## 2021-07-11 MED ORDER — ADULT MULTIVITAMIN W/MINERALS CH
1.0000 | ORAL_TABLET | Freq: Every day | ORAL | Status: DC
  Administered 2021-07-11 – 2021-07-15 (×5): 1 via ORAL
  Filled 2021-07-11 (×5): qty 1

## 2021-07-11 MED ORDER — BISACODYL 5 MG PO TBEC: 10.0000 mg | DELAYED_RELEASE_TABLET | Freq: Every day | ORAL | Status: DC | PRN

## 2021-07-11 MED ORDER — BISACODYL 10 MG RE SUPP: 10.0000 mg | Freq: Every evening | RECTAL | Status: DC | PRN

## 2021-07-11 MED ORDER — BISACODYL 5 MG PO TBEC
10.0000 mg | DELAYED_RELEASE_TABLET | Freq: Once | ORAL | Status: AC
Start: 2021-07-11 — End: 2021-07-11
  Administered 2021-07-11: 10 mg via ORAL
  Filled 2021-07-11: qty 2

## 2021-07-11 MED ORDER — AZITHROMYCIN 250 MG PO TABS
500.0000 mg | ORAL_TABLET | Freq: Every day | ORAL | Status: AC
  Administered 2021-07-11 – 2021-07-13 (×3): 500 mg via ORAL
  Filled 2021-07-11 (×3): qty 2

## 2021-07-11 MED ORDER — ENSURE ENLIVE PO LIQD
237.0000 mL | Freq: Two times a day (BID) | ORAL | Status: DC
  Administered 2021-07-12 – 2021-07-15 (×7): 237 mL via ORAL

## 2021-07-11 NOTE — Progress Notes (Signed)
?Progress Note ? ? ?Patient: Bradley [name] LTJ:[id number] DOB: [birth year] DOA: 07/09/2021     2 ?DOS: the patient was seen and examined on 07/11/2021 ?  ?Brief hospital course: ?Taken from H&P. ? ?CEPHUS TUPY is a 71 y.o. Caucasian male with medical history significant for COPD, coronary artery disease, type 2 diabetes mellitus and hypertension, who presented to the emergency room with acute onset of worsening dyspnea for the last 3 days with associated cough productive of clear sputum, chest tightness and wheezing.  No chest pain or palpitations.  He admitted to chills but did not check his temperature.  He has been having dry heaves.  No abdominal pain or diarrhea or melena or bright red bleeding per rectum.  No dysuria, oliguria or hematuria or flank pain.; ?  ?ED Course: Upon presentation to the ER, pulse oximetry was 9192% on room air and it came down to 88%.  He was placed on high flow nasal cannula and pulse symmetry was up to 96%.  Respiratory rate was 23 with otherwise normal vital signs. ?EKG as reviewed by me :  EKG showed sinus rhythm with a rate of 95 with right bundle branch block. ?Imaging: Portable chest ray showed advanced emphysema and patchy airspace disease in the lower lung zones that may be increased from prior exam and now concerning for pneumonia. ? ?The patient was given IV Rocephin and Zithromax as well as 3 DuoNebs and IV Solu-Medrol. ? ?5/9: Patient met sepsis criteria with leukocytosis and tachypnea, most likely secondary to pneumonia.  COVID and flu PCR negative. ?Blood cultures pending. ?Ordered respiratory viral panel, procalcitonin and MRSA PCR ?Procalcitonin elevated at 1.39. ? ? ?Assessment and Plan: ?* Sepsis due to pneumonia [facility]) ?Patient met sepsis criteria with leukocytosis and tachypnea, most likely secondary to pneumonia. ?Procalcitonin elevated at 1.39. ?Lactic acid within normal limit. ?COVID-19 and influenza PCR negative ?Respiratory viral panel pending. ?MRSA PCR  Pending. ?-Continue with ceftriaxone and Zithromax ?-Continue with supportive care ? ?Community acquired pneumonia ?- See above ? ?COPD exacerbation ([place]) ?Patient was wheezing on arrival to ED.  Received Solu-Medrol and DuoNeb ?Most likely secondary to pneumonia. ?Checking respiratory viral panel. ?-Continue with steroid ?-Continue with DuoNeb ?-Continue with as needed bronchodilators ?-Continue supplemental oxygen. ? ?Acute respiratory failure with hypoxia ([place]) ?Acute on chronic hypoxic respiratory failure, baseline oxygen use of around 3 L.  2.5 to 4 L was mention in his chart.  Currently on 10 L of oxygen. ?-Continue with oxygen-wean to baseline as tolerated ? ?Essential hypertension ?Pressure within goal.  Patient is just on metoprolol at home. ?-Continue with metoprolol ? ?Dyslipidemia ?We will continue statin therapy. ? ?Type 2 diabetes mellitus without complications ([place]) ?Patient was on metformin at home. ?CBG elevated-patient is now on steroid. ?-Add Semglee 10 units twice daily ?-Moderate sliding scale ? ? ?Subjective: No significant events overnight, patient said that he is 50% better now, still requiring 4 to oxygen via nasal cannula, at home patient uses 2.5 L. ?Patient denied any chest pain or palpitations, no any other active issues. ? ?Physical Exam: ?Vitals:  ? 07/11/21 1108 07/11/21 1110 07/11/21 1524 07/11/21 1700  ?BP:  107/74  115/64  ?Pulse:  79  88  ?Resp:  20  20  ?Temp:  (!) 97.5 ?F (36.4 ?C)  (!) 97.5 ?F (36.4 ?C)  ?TempSrc:  Oral  Oral  ?SpO2: 94% 100% 93% 99%  ?Weight:      ?Height:      ? ?General.  Frail elderly  man, mild respiratory distress. ?Pulmonary.  Mild crackles bilaterally, no wheezing appreciated, increased respiratory effort. ?CV.  Regular rate and rhythm, no JVD, rub or murmur. ?Abdomen.  Soft, nontender, nondistended, BS positive. ?CNS.  Alert and oriented .  No focal neurologic deficit. ?Extremities.  No edema, no cyanosis, pulses intact and symmetrical. ?Psychiatry.   Judgment and insight appears normal. ? ?Data Reviewed: ?Prior notes, labs and images reviewed ? ?Family Communication:  ? ?Disposition: ?Status is: Inpatient ?Remains inpatient appropriate because: Talked with daughter on phone ? ? Planned Discharge Destination: Home ? ?DVT prophylaxis.  Lovenox ?Time spent: 40 minutes ? ?This record has been created using Systems analyst. Errors have been sought and corrected,but may not always be located. Such creation errors do not reflect on the standard of care. ? ?Author: ?Val Riles, MD ?07/11/2021 5:48 PM ? ?For on call review [email or web address].  ?

## 2021-07-11 NOTE — Plan of Care (Signed)

## 2021-07-11 NOTE — Progress Notes (Signed)
Initial Nutrition Assessment ? ?DOCUMENTATION CODES:  ? ?Non-severe (moderate) malnutrition in context of chronic illness ? ?INTERVENTION:  ?- Liberalize diet from a heart healthy to a carb modified diet to provide widest variety of menu options to enhance nutritional adequacy ? ?- Ensure Enlive po BID, each supplement provides 350 kcal and 20 grams of protein. ? ?- MVI with minerals daily ? ?Damaris Schooner with MD about bowel regimen  ? ?NUTRITION DIAGNOSIS:  ? ?Moderate Malnutrition related to chronic illness (COPD) as evidenced by mild fat depletion, moderate muscle depletion, mild muscle depletion. ? ?GOAL:  ? ?Patient will meet greater than or equal to 90% of their needs ? ?MONITOR:  ? ?PO intake, Supplement acceptance, Diet advancement, Labs, Weight trends ? ?REASON FOR ASSESSMENT:  ? ?Consult ?Assessment of nutrition requirement/status ? ?ASSESSMENT:  ? ?Pt presents with SOB. Admitted with sepsis d/t PNA. PMH significant for COPD, CAD, T2DM and HTN. ? ?Pt states that when he is not SOB he eats 2 full meals daily. His son and his girlfriend live next door and provide meals for him. His typical intake includes an egg mcmuffin for breakfast with coffee and dinner may include spaghetti or pork chops. He states that within the past 2 weeks d/t increase in SOB, he has had decreased intake d/t inability to catch his breath when he eats. During admission he reports eating at least 65% of each meal. For lunch he had spaghetti and a salad of which he ate most of and had a few bites of pineapple.  ? ?At home he manages his blood sugar with meformin. His checks his blood sugar daily and it often runs between 130-160. Pt states that he used to drink wine which increased his blood sugars but has since stopped and currently drinks 4 beers daily which he says does not increase his blood sugars. Briefly spoke with pt about the importance of decreasing alcohol intake for overall health and nutritional status.  ? ?Pt's blood sugars  have been elevated during admission which is likely d/t addition of steroids. He would benefit from addition of a liberalized diet and nutrition supplements to enhance nutritional adequacy.  ? ?Pt states that his usual weight is 153 lbs and that during admission he was noted to weigh 144 lbs, however he did not suspect weight loss has occurred. Reviewed weight history. It appears his weight has remained stable within the last 5 months but has had steady weight loss over the last 3 years. Will continue to monitor throughout admission.  ? ?Pt reports his last BM was Saturday. He typically has a BM every other day. This could likely be attributed to decreased PO intake and immobility d/t SOB since this weekend. Discussed bowel regimen with pt. He states that he would consider miralax tomorrow. Reached out to MD with concerns and recommendations for possible bowel regimen. ? ?Medications: zithromax, pepcid, SSI 0-15 units TID, 0-5 units QHS, semglee 10 units BID, miralax, prednisone, IV abx ? ?Labs: BUN 24, anion gap 4, CBG's 248-297 x24 hours ?  ?NUTRITION - FOCUSED PHYSICAL EXAM: ? ?Flowsheet Row Most Recent Value  ?Orbital Region Mild depletion  ?Upper Arm Region Moderate depletion  ?Thoracic and Lumbar Region No depletion  ?Buccal Region Mild depletion  ?Temple Region Moderate depletion  ?Clavicle Bone Region Mild depletion  ?Clavicle and Acromion Bone Region Moderate depletion  ?Scapular Bone Region Mild depletion  ?Dorsal Hand Moderate depletion  ?Patellar Region Severe depletion  ?Anterior Thigh Region Moderate depletion  ?Posterior Calf  Region Moderate depletion  ?Edema (RD Assessment) None  ?Hair Reviewed  ?Eyes Reviewed  ?Mouth Reviewed  ?Skin Reviewed  ?Nails Reviewed  ? ?  ? ?Diet Order:   ?Diet Order   ? ?       ?  Diet Carb Modified Fluid consistency: Thin; Room service appropriate? Yes  Diet effective now       ?  ? ?  ?  ? ?  ? ? ?EDUCATION NEEDS:  ? ?Education needs have been addressed ? ?Skin:  Skin  Assessment: Reviewed RN Assessment ? ?Last BM:  unknown ? ?Height:  ? ?Ht Readings from Last 1 Encounters:  ?07/09/21 5\' 7"  (1.702 m)  ? ? ?Weight:  ? ?Wt Readings from Last 1 Encounters:  ?07/10/21 67.7 kg  ? ?BMI:  Body mass index is 23.37 kg/m?. ? ?Estimated Nutritional Needs:  ? ?Kcal:  1700-1900 ? ?Protein:  85-100g ? ?Fluid:  >/=1.7L ? ?Clayborne Dana, RDN, LDN ?Clinical Nutrition ?

## 2021-07-12 DIAGNOSIS — J189 Pneumonia, unspecified organism: Secondary | ICD-10-CM | POA: Diagnosis not present

## 2021-07-12 DIAGNOSIS — A419 Sepsis, unspecified organism: Secondary | ICD-10-CM | POA: Diagnosis not present

## 2021-07-12 LAB — RESPIRATORY PANEL BY PCR

## 2021-07-12 LAB — GLUCOSE, CAPILLARY
Glucose-Capillary: 128 mg/dL — ABNORMAL HIGH (ref 70–99)
Glucose-Capillary: 160 mg/dL — ABNORMAL HIGH (ref 70–99)
Glucose-Capillary: 325 mg/dL — ABNORMAL HIGH (ref 70–99)
Glucose-Capillary: 313 mg/dL — ABNORMAL HIGH (ref 70–99)

## 2021-07-12 LAB — BASIC METABOLIC PANEL
Anion gap: 3 — ABNORMAL LOW (ref 5–15)
BUN: 24 mg/dL — ABNORMAL HIGH (ref 8–23)
CO2: 24 mmol/L (ref 22–32)
Calcium: 8.7 mg/dL — ABNORMAL LOW (ref 8.9–10.3)
Chloride: 112 mmol/L — ABNORMAL HIGH (ref 98–111)
Creatinine, Ser: 1.07 mg/dL (ref 0.61–1.24)
GFR, Estimated: >60 mL/min (ref >60)
Glucose, Bld: 139 mg/dL — ABNORMAL HIGH (ref 70–99)
Potassium: 4 mmol/L (ref 3.5–5.1)
Sodium: 139 mmol/L (ref 135–145)

## 2021-07-12 LAB — CBC
HCT: 38.2 % — ABNORMAL LOW (ref 39.0–52.0)
Hemoglobin: 11.9 g/dL — ABNORMAL LOW (ref 13.0–17.0)
MCH: 26.3 pg (ref 26.0–34.0)
MCHC: 31.2 g/dL (ref 30.0–36.0)
MCV: 84.3 fL (ref 80.0–100.0)
Platelets: 236 10*3/uL (ref 150–400)
RBC: 4.53 MIL/uL (ref 4.22–5.81)
RDW: 15 % (ref 11.5–15.5)
WBC: 13.1 10*3/uL — ABNORMAL HIGH (ref 4.0–10.5)
nRBC: 0 % (ref 0.0–0.2)

## 2021-07-12 LAB — PHOSPHORUS: Phosphorus: 3.4 mg/dL (ref 2.5–4.6)

## 2021-07-12 LAB — MAGNESIUM: Magnesium: 2.1 mg/dL (ref 1.7–2.4)

## 2021-07-12 MED ORDER — FLUTICASONE FUROATE-VILANTEROL 200-25 MCG/ACT IN AEPB
1.0000 | INHALATION_SPRAY | Freq: Every day | RESPIRATORY_TRACT | Status: DC
  Administered 2021-07-12 – 2021-07-15 (×4): 1 via RESPIRATORY_TRACT
  Filled 2021-07-12: qty 28

## 2021-07-12 NOTE — Progress Notes (Signed)
Occupational Therapy Treatment ?Patient Details ?Name: Bradley [name] ?MRN: [id number] ?DOB: 07-30-1950 ?Today's Date: 07/12/2021 ? ? ?History of present illness 71 y.o. male with medical history significant for COPD, coronary artery disease, type 2 diabetes mellitus and hypertension, who presented to the ED with acute onset of worsening dyspnea for the 3 days leading to admission with associated cough productive of clear sputum, chest tightness, wheezing and nausea. ?  ?OT comments ? Pt seen for OT tx. Pt in bed, family present, endorsing feeling better than previous date. SpO2 88-92% at rest on 5L O2. Pt endorses some chest tightness "like I can't expand my lungs all the way." Pt/family instructed in home/routines modifications, additional AE/DME for in home versus community, activity pacing, and instructed in incentive spirometer use with pt able to return demo proper use. Pt provided with instructions for using. Pt progressing towards goals, continues to benefit from skilled OT services.   ? ?Recommendations for follow up therapy are one component of a multi-disciplinary discharge planning process, led by the attending physician.  Recommendations may be updated based on patient status, additional functional criteria and insurance authorization. ?   ?Follow Up Recommendations ? Home health OT  ?  ?Assistance Recommended at Discharge PRN  ?Patient can return home with the following ? A little help with bathing/dressing/bathroom;Assistance with cooking/housework;Assist for transportation;Help with stairs or ramp for entrance ?  ?Equipment Recommendations ? Tub/shower seat  ?  ?Recommendations for Other Services   ? ?  ?Precautions / Restrictions Precautions ?Precautions: None ?Precaution Comments: monitor O2 sats ?Restrictions ?Weight Bearing Restrictions: No  ? ? ?  ? ?Mobility Bed Mobility ?Overal bed mobility: Modified Independent ?  ?  ?  ?  ?  ?  ?  ?  ? ?Transfers ?  ?  ?  ?  ?  ?  ?  ?  ?  ?  ?  ?  ?Balance  Overall balance assessment: Modified Independent ?  ?  ?  ?  ?  ?  ?  ?  ?  ?  ?  ?  ?  ?  ?  ?  ?  ?  ?   ? ?ADL either performed or assessed with clinical judgement  ? ?ADL   ?  ?  ?  ?  ?  ?  ?  ?  ?  ?  ?  ?  ?  ?  ?  ?  ?  ?  ?  ?General ADL Comments: Increased WOB and desats with limited exertion requiring rest breaks. ?  ? ?Extremity/Trunk Assessment   ?  ?  ?  ?  ?  ? ?Vision   ?  ?  ?Perception   ?  ?Praxis   ?  ? ?Cognition Arousal/Alertness: Awake/alert ?Behavior During Therapy: [facility] for tasks assessed/performed ?Overall Cognitive Status: Within Functional Limits for tasks assessed ?  ?  ?  ?  ?  ?  ?  ?  ?  ?  ?  ?  ?  ?  ?  ?  ?  ?  ?  ?   ?Exercises Other Exercises ?Other Exercises: Pt/family instructed in home/routines modifications, additional AE/DME for in home versus community, activity pacing, and instructed in incentive spirometer use ? ?  ?Shoulder Instructions   ? ? ?  ?General Comments    ? ? ?Pertinent Vitals/ Pain       Pain Assessment ?Pain Assessment: No/denies pain ? ?Home Living   ?  ?  ?  ?  ?  ?  ?  ?  ?  ?  ?  ?  ?  ?  ?  ?  ?  ?  ? ?  ?  Prior Functioning/Environment    ?  ?  ?  ?   ? ?Frequency ? Min 2X/week  ? ? ? ? ?  ?Progress Toward Goals ? ?OT Goals(current goals can now be found in the care plan section) ? Progress towards OT goals: Progressing toward goals ? ?Acute Rehab OT Goals ?Patient Stated Goal: breathe better and go home ?OT Goal Formulation: With patient ?Time For Goal Achievement: 07/24/21 ?Potential to Achieve Goals: Good  ?Plan Discharge plan remains appropriate;Frequency remains appropriate   ? ?Co-evaluation ? ? ?   ?  ?  ?  ?  ? ?  ?AM-PAC OT "6 Clicks" Daily Activity     ?Outcome Measure ? ? Help from another person eating meals?: None ?Help from another person taking care of personal grooming?: None ?Help from another person toileting, which includes using toliet, bedpan, or urinal?: A Little ?Help from another person bathing (including washing, rinsing,  drying)?: A Little ?Help from another person to put on and taking off regular upper body clothing?: None ?Help from another person to put on and taking off regular lower body clothing?: A Little ?6 Click Score: 21 ? ?  ?End of Session Equipment Utilized During Treatment: Oxygen ? ?OT Visit Diagnosis: Other abnormalities of gait and mobility (R26.89) ?  ?Activity Tolerance Patient tolerated treatment well ?  ?Patient Left in bed;with call bell/phone within reach;with family/visitor present ?  ?Nurse Communication   ?  ? ?   ? ?Time: 3005-1102 ?OT Time Calculation (min): 20 min ? ?Charges: OT General Charges ?$OT Visit: 1 Visit ?OT Treatments ?$Self Care/Home Management : 8-22 mins ? ?Ardeth Perfect., MPH, MS, OTR/L ?ascom [phone] ?07/12/21, 1:43 PM ? ?

## 2021-07-12 NOTE — Plan of Care (Signed)

## 2021-07-12 NOTE — Progress Notes (Signed)
?Progress Note ? ? ?Patient: Bradley [name] ZOX:[id number] DOB: 06-21-1950 DOA: 07/09/2021     3 ?DOS: the patient was seen and examined on 07/12/2021 ?  ?Brief hospital course: ?Taken from H&P. ? ?Bradley [name] is a 71 y.o. Caucasian male with medical history significant for COPD, coronary artery disease, type 2 diabetes mellitus and hypertension, who presented to the emergency room with acute onset of worsening dyspnea for the last 3 days with associated cough productive of clear sputum, chest tightness and wheezing.  No chest pain or palpitations.  He admitted to chills but did not check his temperature.  He has been having dry heaves.  No abdominal pain or diarrhea or melena or bright red bleeding per rectum.  No dysuria, oliguria or hematuria or flank pain.; ?  ?ED Course: Upon presentation to the ER, pulse oximetry was 9192% on room air and it came down to 88%.  He was placed on high flow nasal cannula and pulse symmetry was up to 96%.  Respiratory rate was 23 with otherwise normal vital signs. ?EKG as reviewed by me :  EKG showed sinus rhythm with a rate of 95 with right bundle branch block. ?Imaging: Portable chest ray showed advanced emphysema and patchy airspace disease in the lower lung zones that may be increased from prior exam and now concerning for pneumonia. ? ?The patient was given IV Rocephin and Zithromax as well as 3 DuoNebs and IV Solu-Medrol. ? ?5/9: Patient met sepsis criteria with leukocytosis and tachypnea, most likely secondary to pneumonia.  COVID and flu PCR negative. ?Blood cultures pending. ?Ordered respiratory viral panel, procalcitonin and MRSA PCR ?Procalcitonin elevated at 1.39. ? ? ?Assessment and Plan: ?* Sepsis due to pneumonia [facility]) ?Patient met sepsis criteria with leukocytosis and tachypnea, most likely secondary to pneumonia. ?Procalcitonin elevated at 1.39. ?Lactic acid within normal limit. ?COVID-19 and influenza PCR negative ?Respiratory viral panel pending. ?-Continue with  ceftriaxone and Zithromax ?-Continue with supportive care ? ?Community acquired pneumonia ?- See above ? ?COPD exacerbation ([name]) ?Patient was wheezing on arrival to ED.  Received Solu-Medrol and DuoNeb ?Most likely secondary to pneumonia. ?respiratory viral panel still pending ?-Continue with steroid ?-Continue with DuoNeb ?-Continue with as needed bronchodilators ?-Continue supplemental oxygen. ?Added Breo Ellipta inhaler ? ?Acute respiratory failure with hypoxia ([name]) ?Acute on chronic hypoxic respiratory failure, baseline oxygen use of around 3 L.  2.5 to 4 L was mention in his chart.   ?Currently 10 L ---->> 5 L of oxygen, gradually improving ?-Continue with oxygen-wean to baseline as tolerated ? ?Essential hypertension ?Pressure within goal.  Patient is just on metoprolol at home. ?-Continue with metoprolol ? ?Dyslipidemia ?We will continue statin therapy. ? ?Type 2 diabetes mellitus without complications ([name]) ?Patient was on metformin at home. ?CBG elevated-patient is now on steroid. ?-Add Semglee 10 units twice daily ?-Moderate sliding scale ? ? ?Subjective: No significant events overnight, patient feels slight improvement, denies any worsening of shortness of breath, no chest pain or palpitations, no any other active issues. ? ? ?Physical Exam: ?Vitals:  ? 07/12/21 1123 07/12/21 1535 07/12/21 1546 07/12/21 1617  ?BP:    121/77  ?Pulse:    80  ?Resp:    18  ?Temp:    97.8 ?F (36.6 ?C)  ?TempSrc:      ?SpO2: 92% 91% 92% 93%  ?Weight:      ?Height:      ? ?General.  Frail elderly man, mild respiratory distress. ?Pulmonary.  Mild crackles bilaterally, no  wheezing appreciated, increased respiratory effort. ?CV.  Regular rate and rhythm, no JVD, rub or murmur. ?Abdomen.  Soft, nontender, nondistended, BS positive. ?CNS.  Alert and oriented .  No focal neurologic deficit. ?Extremities.  No edema, no cyanosis, pulses intact and symmetrical. ?Psychiatry.  Judgment and insight appears normal. ? ?Data  Reviewed: ?Prior notes, labs and images reviewed ? ?Family Communication:  ? ?Disposition: ?Status is: Inpatient ?Remains inpatient appropriate because: Talked with daughter on phone ? ? Planned Discharge Destination: Home ? ?DVT prophylaxis.  Lovenox ?Time spent: 35 minutes ? ?This record has been created using Systems analyst. Errors have been sought and corrected,but may not always be located. Such creation errors do not reflect on the standard of care. ? ?Author: ?Val Riles, MD ?07/12/2021 4:40 PM ? ?For on call review [email or web address].  ?

## 2021-07-12 NOTE — Progress Notes (Signed)
Physical Therapy Treatment ?Patient Details ?Name: Bradley [name] ?MRN: [id number] ?DOB: 1950/12/16 ?Today's Date: 07/12/2021 ? ? ?History of Present Illness 71 y.o. male with medical history significant for COPD, coronary artery disease, type 2 diabetes mellitus and hypertension, who presented to the ED with acute onset of worsening dyspnea for the 3 days leading to admission with associated cough productive of clear sputum, chest tightness, wheezing and nausea. ? ?  ?PT Comments  ? ? Pt with eagerness to get up and see how he does walking but states "My numbers [O2 sats] stay pretty good while I'm laying but drop quick when I get to doing any moving."  He was ultimately able to ambulate ~75 ft safely and with confidence; however his precipitous drop in O2 despite 6-8L remains a limiter.  Pt did require 15L O2 on eval so is certainly making some gains here but is not close to his 2-3L baseline and normal activity tolerance.    ?Recommendations for follow up therapy are one component of a multi-disciplinary discharge planning process, led by the attending physician.  Recommendations may be updated based on patient status, additional functional criteria and insurance authorization. ? ?Follow Up Recommendations ? Home health PT ?  ?  ?Assistance Recommended at Discharge PRN  ?Patient can return home with the following Assistance with cooking/housework;Assist for transportation ?  ?Equipment Recommendations ? None recommended by PT  ?  ?Recommendations for Other Services   ? ? ?  ?Precautions / Restrictions Precautions ?Precautions: None ?Precaution Comments: monitor O2 sats ?Restrictions ?Weight Bearing Restrictions: No  ?  ? ?Mobility ? Bed Mobility ?Overal bed mobility: Modified Independent ?  ?  ?  ?  ?  ?  ?General bed mobility comments: easily gets to sitting EOB w/o assist ?  ? ?Transfers ?Overall transfer level: Modified independent ?Equipment used: Rolling walker (2 wheels) ?  ?  ?  ?  ?  ?  ?  ?General transfer  comment: offered walker this date and pt decided to use it today, not reliant on it to rise to/maintain standing ?  ? ?Ambulation/Gait ?Ambulation/Gait assistance: Modified independent (Device/Increase time) ?Gait Distance (Feet): 75 Feet ?Assistive device: Rolling walker (2 wheels) ?  ?  ?  ?  ?General Gait Details: 6L-8L O2 t/o the effort.  Pt with some subjective fatigue but did have drop in O2 into the 70s with (for him) a modest distance.  He was able to get O2 sats back to the 90s after ~2 minutes of seated focused breathing but ultimately pt has limited activity tolerance due to O2 burden ? ? ?Stairs ?  ?  ?  ?  ?  ? ? ?Wheelchair Mobility ?  ? ?Modified Rankin (Stroke Patients Only) ?  ? ? ?  ?Balance Overall balance assessment: Modified Independent ?  ?  ?  ?  ?  ?  ?  ?  ?  ?  ?  ?  ?  ?  ?  ?  ?  ?  ?  ? ?  ?Cognition Arousal/Alertness: Awake/alert ?Behavior During Therapy: [facility] for tasks assessed/performed ?Overall Cognitive Status: Within Functional Limits for tasks assessed ?  ?  ?  ?  ?  ?  ?  ?  ?  ?  ?  ?  ?  ?  ?  ?  ?  ?  ?  ? ?  ?Exercises   ? ?  ?General Comments   ?  ?  ? ?Pertinent Vitals/Pain Pain  Assessment ?Pain Assessment: No/denies pain  ? ? ?Home Living   ?  ?  ?  ?  ?  ?  ?  ?  ?  ?   ?  ?Prior Function    ?  ?  ?   ? ?PT Goals (current goals can now be found in the care plan section) Progress towards PT goals: Progressing toward goals ? ?  ?Frequency ? ? ? Min 2X/week ? ? ? ?  ?PT Plan Current plan remains appropriate  ? ? ?Co-evaluation   ?  ?  ?  ?  ? ?  ?AM-PAC PT "6 Clicks" Mobility   ?Outcome Measure ? Help needed turning from your back to your side while in a flat bed without using bedrails?: None ?Help needed moving from lying on your back to sitting on the side of a flat bed without using bedrails?: None ?Help needed moving to and from a bed to a chair (including a wheelchair)?: None ?Help needed standing up from a chair using your arms (e.g., wheelchair or bedside chair)?:  None ?Help needed to walk in hospital room?: None ?Help needed climbing 3-5 steps with a railing? : A Lot ?6 Click Score: 22 ? ?  ?End of Session Equipment Utilized During Treatment: Gait belt;Oxygen (6-8L) ?Activity Tolerance: Patient limited by fatigue ?Patient left: with call bell/phone within reach;in chair ?Nurse Communication: Mobility status ?PT Visit Diagnosis: Muscle weakness (generalized) (M62.81);Difficulty in walking, not elsewhere classified (R26.2);Unsteadiness on feet (R26.81) ?  ? ? ?Time: 1610-9604 ?PT Time Calculation (min) (ACUTE ONLY): 19 min ? ?Charges:  $Gait Training: 8-22 mins          ?          ? ?Kreg Shropshire, DPT ?07/12/2021, 7:22 PM ? ?

## 2021-07-13 DIAGNOSIS — A419 Sepsis, unspecified organism: Secondary | ICD-10-CM | POA: Diagnosis not present

## 2021-07-13 DIAGNOSIS — J189 Pneumonia, unspecified organism: Secondary | ICD-10-CM | POA: Diagnosis not present

## 2021-07-13 LAB — BASIC METABOLIC PANEL
Anion gap: 6 (ref 5–15)
BUN: 25 mg/dL — ABNORMAL HIGH (ref 8–23)
CO2: 29 mmol/L (ref 22–32)
Calcium: 9.3 mg/dL (ref 8.9–10.3)
Chloride: 106 mmol/L (ref 98–111)
Creatinine, Ser: 1.13 mg/dL (ref 0.61–1.24)
GFR, Estimated: >60 mL/min (ref >60)
Glucose, Bld: 137 mg/dL — ABNORMAL HIGH (ref 70–99)
Potassium: 4.2 mmol/L (ref 3.5–5.1)
Sodium: 141 mmol/L (ref 135–145)

## 2021-07-13 LAB — MAGNESIUM: Magnesium: 2.1 mg/dL (ref 1.7–2.4)

## 2021-07-13 LAB — CBC
HCT: 42.1 % (ref 39.0–52.0)
Hemoglobin: 13.1 g/dL (ref 13.0–17.0)
MCH: 26.5 pg (ref 26.0–34.0)
MCHC: 31.1 g/dL (ref 30.0–36.0)
MCV: 85.1 fL (ref 80.0–100.0)
Platelets: 264 10*3/uL (ref 150–400)
RBC: 4.95 MIL/uL (ref 4.22–5.81)
RDW: 14.7 % (ref 11.5–15.5)
WBC: 10.8 10*3/uL — ABNORMAL HIGH (ref 4.0–10.5)
nRBC: 0 % (ref 0.0–0.2)

## 2021-07-13 LAB — GLUCOSE, CAPILLARY
Glucose-Capillary: 116 mg/dL — ABNORMAL HIGH (ref 70–99)
Glucose-Capillary: 169 mg/dL — ABNORMAL HIGH (ref 70–99)
Glucose-Capillary: 231 mg/dL — ABNORMAL HIGH (ref 70–99)
Glucose-Capillary: 234 mg/dL — ABNORMAL HIGH (ref 70–99)

## 2021-07-13 LAB — PHOSPHORUS: Phosphorus: 3.3 mg/dL (ref 2.5–4.6)

## 2021-07-13 MED ORDER — ASCORBIC ACID 500 MG PO TABS
500.0000 mg | ORAL_TABLET | Freq: Every day | ORAL | Status: DC
  Administered 2021-07-13 – 2021-07-15 (×3): 500 mg via ORAL
  Filled 2021-07-13 (×3): qty 1

## 2021-07-13 MED ORDER — IPRATROPIUM-ALBUTEROL 0.5-2.5 (3) MG/3ML IN SOLN
3.0000 mL | Freq: Three times a day (TID) | RESPIRATORY_TRACT | Status: DC
  Administered 2021-07-13 – 2021-07-15 (×6): 3 mL via RESPIRATORY_TRACT
  Filled 2021-07-13 (×6): qty 3

## 2021-07-13 MED ORDER — ORAL CARE MOUTH RINSE
15.0000 mL | Freq: Two times a day (BID) | OROMUCOSAL | Status: DC
  Administered 2021-07-15: 15 mL via OROMUCOSAL

## 2021-07-13 NOTE — Progress Notes (Signed)
?Progress Note ? ? ?Patient: Bradley [name] ZGY:[id number] DOB: [birth year] DOA: 07/09/2021     4 ?DOS: the patient was seen and examined on 07/13/2021 ?  ?Brief hospital course: ?Taken from H&P. ? ?Bradley [name] is a 71 y.o. Caucasian male with medical history significant for COPD, coronary artery disease, type 2 diabetes mellitus and hypertension, who presented to the emergency room with acute onset of worsening dyspnea for the last 3 days with associated cough productive of clear sputum, chest tightness and wheezing.  No chest pain or palpitations.  He admitted to chills but did not check his temperature.  He has been having dry heaves.  No abdominal pain or diarrhea or melena or bright red bleeding per rectum.  No dysuria, oliguria or hematuria or flank pain.; ?  ?ED Course: Upon presentation to the ER, pulse oximetry was 9192% on room air and it came down to 88%.  He was placed on high flow nasal cannula and pulse symmetry was up to 96%.  Respiratory rate was 23 with otherwise normal vital signs. ?EKG as reviewed by me :  EKG showed sinus rhythm with a rate of 95 with right bundle branch block. ?Imaging: Portable chest ray showed advanced emphysema and patchy airspace disease in the lower lung zones that may be increased from prior exam and now concerning for pneumonia. ? ?The patient was given IV Rocephin and Zithromax as well as 3 DuoNebs and IV Solu-Medrol. ? ?5/9: Patient met sepsis criteria with leukocytosis and tachypnea, most likely secondary to pneumonia.  COVID and flu PCR negative. ?Blood cultures pending. ?Ordered respiratory viral panel, procalcitonin and MRSA PCR ?Procalcitonin elevated at 1.39. ? ? ?Assessment and Plan: ?* Sepsis due to pneumonia,  ?Patient met sepsis criteria with leukocytosis and tachypnea, most likely secondary to pneumonia. ?Procalcitonin elevated at 1.39. ?Lactic acid within normal limit. ?COVID-19 and influenza PCR negative ?Respiratory viral panel positive for rhinovirus ?-Continue  with ceftriaxone and Zithromax ?-Continue with supportive care ? ?Pneumonia secondary to rhinovirus ?Continue treatment as above ? ?COPD exacerbation ([place]) ?Patient was wheezing on arrival to ED.  Received Solu-Medrol and DuoNeb ?Most likely secondary to pneumonia. ?respiratory viral panel still pending ?-Continue with steroid ?-Continue with DuoNeb ?-Continue with as needed bronchodilators ?-Continue supplemental oxygen. ?Added Breo Ellipta inhaler ? ?Acute respiratory failure with hypoxia ([place]) ?Acute on chronic hypoxic respiratory failure, baseline oxygen use of around 3 L.  2.5 to 4 L was mention in his chart.   ?Currently 10 L ---->> 5 L of oxygen, gradually improving ?-Continue with oxygen-wean to baseline as tolerated ? ?Essential hypertension ?Pressure within goal.  Patient is just on metoprolol at home. ?-Continue with metoprolol ? ?Dyslipidemia ?We will continue statin therapy. ? ?Type 2 diabetes mellitus without complications ([place]) ?Patient was on metformin at home. ?CBG elevated-patient is now on steroid. ?-Add Semglee 10 units twice daily ?-Moderate sliding scale ? ? ?Subjective: No significant events overnight, patient feels slight improvement, denies any worsening of shortness of breath, no chest pain or palpitations, no any other active issues. ? ? ?Physical Exam: ?Vitals:  ? 07/12/21 2315 07/13/21 0340 07/13/21 0839 07/13/21 1140  ?BP: 130/80  126/68 121/69  ?Pulse: 72  72 81  ?Resp:      ?Temp: 97.6 ?F (36.4 ?C) 97.9 ?F (36.6 ?C) 97.8 ?F (36.6 ?C) 97.8 ?F (36.6 ?C)  ?TempSrc: Oral Oral Oral Oral  ?SpO2: 96%  92% 96%  ?Weight:      ?Height:      ? ?General.  Frail elderly man, mild respiratory distress. ?Pulmonary.  Mild crackles bilaterally, no wheezing appreciated, increased respiratory effort. ?CV.  Regular rate and rhythm, no JVD, rub or murmur. ?Abdomen.  Soft, nontender, nondistended, BS positive. ?CNS.  Alert and oriented .  No focal neurologic deficit. ?Extremities.  No edema, no cyanosis,  pulses intact and symmetrical. ?Psychiatry.  Judgment and insight appears normal. ? ?Data Reviewed: ?Prior notes, labs and images reviewed ? ?Family Communication:  ? ?Disposition: ?Status is: Inpatient ?Remains inpatient appropriate because: Talked with daughter on phone ? ? Planned Discharge Destination: Home ? ?DVT prophylaxis.  Lovenox ?Time spent: 35 minutes ? ?This record has been created using Systems analyst. Errors have been sought and corrected,but may not always be located. Such creation errors do not reflect on the standard of care. ? ?Author: ?Val Riles, MD ?07/13/2021 4:33 PM ? ?For on call review [email or web address]  ?

## 2021-07-14 DIAGNOSIS — J189 Pneumonia, unspecified organism: Secondary | ICD-10-CM | POA: Diagnosis not present

## 2021-07-14 DIAGNOSIS — A419 Sepsis, unspecified organism: Secondary | ICD-10-CM | POA: Diagnosis not present

## 2021-07-14 LAB — CBC
HCT: 44.7 % (ref 39.0–52.0)
Hemoglobin: 14.1 g/dL (ref 13.0–17.0)
MCH: 26 pg (ref 26.0–34.0)
MCHC: 31.5 g/dL (ref 30.0–36.0)
MCV: 82.3 fL (ref 80.0–100.0)
Platelets: 319 10*3/uL (ref 150–400)
RBC: 5.43 MIL/uL (ref 4.22–5.81)
RDW: 14.7 % (ref 11.5–15.5)
WBC: 11.1 10*3/uL — ABNORMAL HIGH (ref 4.0–10.5)
nRBC: 0 % (ref 0.0–0.2)

## 2021-07-14 LAB — CULTURE, BLOOD (ROUTINE X 2)
Culture: NO GROWTH
Culture: NO GROWTH
Special Requests: ADEQUATE
Special Requests: ADEQUATE

## 2021-07-14 LAB — BASIC METABOLIC PANEL
Anion gap: 10 (ref 5–15)
BUN: 29 mg/dL — ABNORMAL HIGH (ref 8–23)
CO2: 29 mmol/L (ref 22–32)
Calcium: 9.5 mg/dL (ref 8.9–10.3)
Chloride: 100 mmol/L (ref 98–111)
Creatinine, Ser: 1.13 mg/dL (ref 0.61–1.24)
GFR, Estimated: >60 mL/min (ref >60)
Glucose, Bld: 210 mg/dL — ABNORMAL HIGH (ref 70–99)
Potassium: 5.1 mmol/L (ref 3.5–5.1)
Sodium: 139 mmol/L (ref 135–145)

## 2021-07-14 LAB — GLUCOSE, CAPILLARY
Glucose-Capillary: 182 mg/dL — ABNORMAL HIGH (ref 70–99)
Glucose-Capillary: 183 mg/dL — ABNORMAL HIGH (ref 70–99)
Glucose-Capillary: 226 mg/dL — ABNORMAL HIGH (ref 70–99)
Glucose-Capillary: 300 mg/dL — ABNORMAL HIGH (ref 70–99)
Glucose-Capillary: 263 mg/dL — ABNORMAL HIGH (ref 70–99)

## 2021-07-14 LAB — MAGNESIUM: Magnesium: 2.4 mg/dL (ref 1.7–2.4)

## 2021-07-14 LAB — PHOSPHORUS: Phosphorus: 4.2 mg/dL (ref 2.5–4.6)

## 2021-07-14 NOTE — Progress Notes (Signed)
?Progress Note ? ? ?Patient: Bradley [name] BTD:[id number] DOB: 10-Dec-1950 DOA: 07/09/2021     5 ?DOS: the patient was seen and examined on 07/14/2021 ?  ?Brief hospital course: ?Taken from H&P. ? ?Bradley [name] is a 71 y.o. Caucasian male with medical history significant for COPD, coronary artery disease, type 2 diabetes mellitus and hypertension, who presented to the emergency room with acute onset of worsening dyspnea for the last 3 days with associated cough productive of clear sputum, chest tightness and wheezing.  No chest pain or palpitations.  He admitted to chills but did not check his temperature.  He has been having dry heaves.  No abdominal pain or diarrhea or melena or bright red bleeding per rectum.  No dysuria, oliguria or hematuria or flank pain.; ?  ?ED Course: Upon presentation to the ER, pulse oximetry was 9192% on room air and it came down to 88%.  He was placed on high flow nasal cannula and pulse symmetry was up to 96%.  Respiratory rate was 23 with otherwise normal vital signs. ?EKG as reviewed by me :  EKG showed sinus rhythm with a rate of 95 with right bundle branch block. ?Imaging: Portable chest ray showed advanced emphysema and patchy airspace disease in the lower lung zones that may be increased from prior exam and now concerning for pneumonia. ? ?The patient was given IV Rocephin and Zithromax as well as 3 DuoNebs and IV Solu-Medrol. ? ?5/9: Patient met sepsis criteria with leukocytosis and tachypnea, most likely secondary to pneumonia.  COVID and flu PCR negative. ?Blood cultures pending. ?Ordered respiratory viral panel, procalcitonin and MRSA PCR ?Procalcitonin elevated at 1.39. ? ? ?Assessment and Plan: ?* Sepsis due to pneumonia,  ?Patient met sepsis criteria with leukocytosis and tachypnea, most likely secondary to pneumonia. ?Procalcitonin elevated at 1.39. ?Lactic acid within normal limit. ?COVID-19 and influenza PCR negative ?Respiratory viral panel positive for rhinovirus ?-Continue  with ceftriaxone and Zithromax ?-Continue with supportive care ? ?Pneumonia secondary to rhinovirus ?Continue treatment as above ? ?COPD exacerbation ([place]) ?Patient was wheezing on arrival to ED.  Received Solu-Medrol and DuoNeb ?Most likely secondary to pneumonia. ?respiratory viral panel still pending ?-Continue with steroid ?-Continue with DuoNeb ?-Continue with as needed bronchodilators ?-Continue supplemental oxygen. ?Added Breo Ellipta inhaler ? ?Acute respiratory failure with hypoxia ([place]) ?Acute on chronic hypoxic respiratory failure, baseline oxygen use of around 3 L.  2.5 to 4 L was mention in his chart.   ?Currently 10 L ---->> 5-->3 L of oxygen, gradually improving ?-Continue with oxygen-wean to baseline as tolerated ? ?Essential hypertension ?Pressure within goal.  Patient is just on metoprolol at home. ?-Continue with metoprolol ? ?Dyslipidemia ?We will continue statin therapy. ? ?Type 2 diabetes mellitus without complications ([place]) ?Patient was on metformin at home. ?CBG elevated-patient is now on steroid. ?-Add Semglee 10 units twice daily ?-Moderate sliding scale ? ? ?Subjective: No significant events overnight, patient feels improvement in the shortness of breath, no any chest pain or palpitation, no any other active issues.  Still patient does not feel comfortable going home, so plan is to discharge him tomorrow a.m. ? ? ? ?Physical Exam: ?Vitals:  ? 07/14/21 0105 07/14/21 0507 07/14/21 0746 07/14/21 1137  ?BP: 138/75 129/80 116/70 116/75  ?Pulse: 68 71 69 73  ?Resp: 20 (!) 21 17 20   ?Temp: (!) 97.5 ?F (36.4 ?C) 98.2 ?F (36.8 ?C)  98 ?F (36.7 ?C)  ?TempSrc: Oral Oral  Oral  ?SpO2: 93% 99% 91% 91%  ?  Weight:      ?Height:      ? ?General.  Frail elderly man, mild respiratory distress. ?Pulmonary.  Mild crackles bilaterally, no wheezing appreciated, increased respiratory effort. ?CV.  Regular rate and rhythm, no JVD, rub or murmur. ?Abdomen.  Soft, nontender, nondistended, BS positive. ?CNS.  Alert  and oriented .  No focal neurologic deficit. ?Extremities.  No edema, no cyanosis, pulses intact and symmetrical. ?Psychiatry.  Judgment and insight appears normal. ? ?Data Reviewed: ?Prior notes, labs and images reviewed ? ?Family Communication:  ? ?Disposition: ?Status is: Inpatient ?Remains inpatient appropriate because: Talked with daughter on phone ? ? Planned Discharge Destination: Home ? ?DVT prophylaxis.  Lovenox ?Time spent: 35 minutes ? ?This record has been created using Systems analyst. Errors have been sought and corrected,but may not always be located. Such creation errors do not reflect on the standard of care. ? ?Author: ?Val Riles, MD ?07/14/2021 2:39 PM ? ?For on call review [email or web address].  ?

## 2021-07-15 DIAGNOSIS — A419 Sepsis, unspecified organism: Secondary | ICD-10-CM | POA: Diagnosis not present

## 2021-07-15 DIAGNOSIS — J189 Pneumonia, unspecified organism: Secondary | ICD-10-CM | POA: Diagnosis not present

## 2021-07-15 LAB — CBC
HCT: 46.8 % (ref 39.0–52.0)
Hemoglobin: 14.8 g/dL (ref 13.0–17.0)
MCH: 26.1 pg (ref 26.0–34.0)
MCHC: 31.6 g/dL (ref 30.0–36.0)
MCV: 82.5 fL (ref 80.0–100.0)
Platelets: 319 10*3/uL (ref 150–400)
RBC: 5.67 MIL/uL (ref 4.22–5.81)
RDW: 14.7 % (ref 11.5–15.5)
WBC: 13.6 10*3/uL — ABNORMAL HIGH (ref 4.0–10.5)
nRBC: 0 % (ref 0.0–0.2)

## 2021-07-15 LAB — BASIC METABOLIC PANEL
Anion gap: 10 (ref 5–15)
BUN: 34 mg/dL — ABNORMAL HIGH (ref 8–23)
CO2: 31 mmol/L (ref 22–32)
Calcium: 9.3 mg/dL (ref 8.9–10.3)
Chloride: 98 mmol/L (ref 98–111)
Creatinine, Ser: 1.17 mg/dL (ref 0.61–1.24)
GFR, Estimated: >60 mL/min (ref >60)
Glucose, Bld: 168 mg/dL — ABNORMAL HIGH (ref 70–99)
Potassium: 4.3 mmol/L (ref 3.5–5.1)
Sodium: 139 mmol/L (ref 135–145)

## 2021-07-15 LAB — GLUCOSE, CAPILLARY
Glucose-Capillary: 183 mg/dL — ABNORMAL HIGH (ref 70–99)
Glucose-Capillary: 177 mg/dL — ABNORMAL HIGH (ref 70–99)

## 2021-07-15 MED ORDER — IPRATROPIUM-ALBUTEROL 0.5-2.5 (3) MG/3ML IN SOLN
3.0000 mL | Freq: Two times a day (BID) | RESPIRATORY_TRACT | Status: DC
Start: 2021-07-15 — End: 2021-07-15

## 2021-07-15 NOTE — Progress Notes (Signed)
Order to discharge pt home.  Discharge instructions/AVS given to patient and reviewed - education provided as needed.  Pt advised to call PCP and/or come back to the hospital if there are any problems. Pt verbalized understanding.    

## 2021-07-15 NOTE — Plan of Care (Signed)

## 2021-07-15 NOTE — Progress Notes (Signed)
Physical Therapy Treatment ?Patient Details ?Name: Bradley [name] ?MRN: [id number] ?DOB: 17-Nov-1950 ?Today's Date: 07/15/2021 ? ? ?History of Present Illness 71 y.o. male with medical history significant for COPD, coronary artery disease, type 2 diabetes mellitus and hypertension, who presented to the ED with acute onset of worsening dyspnea for the 3 days leading to admission with associated cough productive of clear sputum, chest tightness, wheezing and nausea. ? ?  ?PT Comments  ? ? Pt back down to baseline flow rate, has baseline levels of dyspnea on exertion, very mild desaturation to upper 80s% with recovery within 60sec. Pt AMB twice today without RW. Pt reports feeling safe to go home. Still recommending HHPT to futher progress post DC.    ?Recommendations for follow up therapy are one component of a multi-disciplinary discharge planning process, led by the attending physician.  Recommendations may be updated based on patient status, additional functional criteria and insurance authorization. ? ?Follow Up Recommendations ? Home health PT ?  ?  ?Assistance Recommended at Discharge PRN  ?Patient can return home with the following Assistance with cooking/housework;Assist for transportation ?  ?Equipment Recommendations ? None recommended by PT  ?  ?Recommendations for Other Services   ? ? ?  ?Precautions / Restrictions Precautions ?Precautions: None  ?  ? ?Mobility ? Bed Mobility ?Overal bed mobility: Independent ?  ?  ?  ?  ?  ?  ?  ?  ? ?Transfers ?Overall transfer level: Independent ?  ?  ?  ?  ?  ?  ?  ?  ?  ?  ? ?Ambulation/Gait ?Ambulation/Gait assistance: Modified independent (Device/Increase time) ?Gait Distance (Feet): 80 Feet ?Assistive device: None ?  ?  ?  ?  ?General Gait Details: on 3L throughout, stops due to DOE; mild desat intermittently on monitor, often lower than reading on intermittent oximeter ? ? ?Stairs ?  ?  ?  ?  ?  ? ? ?Wheelchair Mobility ?  ? ?Modified Rankin (Stroke Patients Only) ?   ? ? ?  ?Balance   ?  ?  ?  ?  ?  ?  ?  ?  ?  ?  ?  ?  ?  ?  ?  ?  ?  ?  ?  ? ?  ?Cognition Arousal/Alertness: Awake/alert ?Behavior During Therapy: [facility] for tasks assessed/performed ?Overall Cognitive Status: Within Functional Limits for tasks assessed ?  ?  ?  ?  ?  ?  ?  ?  ?  ?  ?  ?  ?  ?  ?  ?  ?  ?  ?  ? ?  ?Exercises Other Exercises ?Other Exercises: 23ft AMB, rest, 59ft AMB ? ?  ?General Comments   ?  ?  ? ?Pertinent Vitals/Pain Pain Assessment ?Pain Assessment: No/denies pain  ? ? ?Home Living   ?  ?  ?  ?  ?  ?  ?  ?  ?  ?   ?  ?Prior Function    ?  ?  ?   ? ?PT Goals (current goals can now be found in the care plan section) Acute Rehab PT Goals ?Patient Stated Goal: Go home ?PT Goal Formulation: With patient ?Time For Goal Achievement: 07/24/21 ?Potential to Achieve Goals: Good ?Progress towards PT goals: Progressing toward goals ? ?  ?Frequency ? ? ? Min 2X/week ? ? ? ?  ?PT Plan Current plan remains appropriate  ? ? ?Co-evaluation   ?  ?  ?  ?  ? ?  ?  AM-PAC PT "6 Clicks" Mobility   ?Outcome Measure ? Help needed turning from your back to your side while in a flat bed without using bedrails?: None ?Help needed moving from lying on your back to sitting on the side of a flat bed without using bedrails?: None ?Help needed moving to and from a bed to a chair (including a wheelchair)?: None ?Help needed standing up from a chair using your arms (e.g., wheelchair or bedside chair)?: None ?Help needed to walk in hospital room?: None ?Help needed climbing 3-5 steps with a railing? : A Lot ?6 Click Score: 22 ? ?  ?End of Session Equipment Utilized During Treatment: Oxygen ?Activity Tolerance: No increased pain;Treatment limited secondary to medical complications (Comment) ?Patient left: with call bell/phone within reach;in bed ?Nurse Communication: Mobility status ?PT Visit Diagnosis: Muscle weakness (generalized) (M62.81);Difficulty in walking, not elsewhere classified (R26.2);Unsteadiness on feet (R26.81) ?   ? ? ?Time: 7255-0016 ?PT Time Calculation (min) (ACUTE ONLY): 10 min ? ?Charges:  $Therapeutic Exercise: 8-22 mins          ?          ?11:15 AM, 07/15/21 ?Etta Grandchild, PT, DPT ?Physical Therapist - [place] ?[facility]  ?[phone] (ASCOM)  ? ? ? ?Nisa Decaire C ?07/15/2021, 11:14 AM ? ?

## 2021-07-15 NOTE — Discharge Summary (Signed)
[facility] Discharge Summary ? ? ?Patient: Bradley [name] GYI:[id number]  PCP: Valera Castle, MD  ?Date of admission: 07/09/2021   Date of discharge:  07/15/2021   ?  ?Discharge Diagnoses:  ?Principal Problem: ?  Sepsis due to pneumonia [facility]) ?Active Problems: ?  Community acquired pneumonia ?  COPD exacerbation ([place]) ?  Acute respiratory failure with hypoxia ([place]) ?  Type 2 diabetes mellitus without complications ([place]) ?  Dyslipidemia ?  Essential hypertension ?  Malnutrition of moderate degree ? ? ?Admitted From: Home ?Disposition:  Home  ? ?Recommendations for Outpatient Follow-up:  ?PCP: in 1 wk ?Follow up LABS/TEST:  CXRin 4 wks ? ? ?Diet recommendation: Cardiac diet ? ?Activity: The patient is advised to gradually reintroduce usual activities, as tolerated ? ?Discharge Condition: stable ? ?Code Status: Full code  ? ?History of present illness: As per the H and P dictated on admission ?Hospital Course:  ?JONERIC STREIGHT is a 71 y.o. Caucasian male with medical history significant for COPD, coronary artery disease, type 2 diabetes mellitus and hypertension, who presented to the emergency room with acute onset of worsening dyspnea for the last 3 days with associated cough productive of clear sputum, chest tightness and wheezing.  No chest pain or palpitations.  He admitted to chills but did not check his temperature.  He has been having dry heaves.  No abdominal pain or diarrhea or melena or bright red bleeding per rectum.  No dysuria, oliguria or hematuria or flank pain.; ?ED Course: Upon presentation to the ER, pulse oximetry was 9192% on room air and it came down to 88%.  He was placed on high flow nasal cannula and pulse symmetry was up to 96%.  Respiratory rate was 23 with otherwise normal vital signs. ?EKG as reviewed by me :  EKG showed sinus rhythm with a rate of 95 with right bundle branch block. ?Imaging: Portable chest ray showed advanced emphysema and patchy airspace disease in the lower lung  zones that may be increased from prior exam and now concerning for pneumonia. ? ?The patient was given IV Rocephin and Zithromax as well as 3 DuoNebs and IV Solu-Medrol. ?5/9: Patient met sepsis criteria with leukocytosis and tachypnea, most likely secondary to pneumonia. COVID and flu PCR negative. Blood cultures negative, respiratory viral panel was ordered and it was positive for rhinovirus, and MRSA PCR, Procalcitonin elevated at 1.39. ?  ?Assessment and Plan: ?* Sepsis due to pneumonia, Patient met sepsis criteria with leukocytosis and tachypnea, most likely secondary to pneumonia.Procalcitonin elevated at 1.39. Lactic acid within normal limit. ?COVID-19 and influenza PCR negative, Respiratory viral panel positive for rhinovirus. S/p ceftriaxone and Zithromax, completed course, for possibility of bacterial superinfection.  O2 saturation improved, currently patient to baseline using 3 L oxygen via [name] (baseline 2.5L), follow with PCP to repeat chest x-ray after 4 weeks for resolution of pneumonia. ?Pneumonia secondary to rhinovirus, s/p above treatment ?Acute on chronic hypoxic respiratory failure due to COPD exacerbation secondary to rhinovirus infection.  Patient was wheezing on arrival to ED.  s/p Solu-Medrol and DuoNeb.  Initially patient was on 10 L oxygen, gradually weaned down.  Resumed inhalers, breathing is back to baseline.  Continue supplemental O2 inhalation. ?Essential hypertension, stable, resume home medications.  ?Dyslipidemia, continue statin therapy. ?Type 2 diabetes mellitus without complications, resumed home medication on discharge.  During hospital stay patient was on insulin.  Patient was advised to continue diabetic diet and follow with PCP. ? ?Body mass index is 23.37  kg/m?.  ?Nutrition Problem: Moderate Malnutrition ?Etiology: chronic illness (COPD) ?Nutrition Interventions: ?Interventions: Ensure Enlive (each supplement provides 350kcal and 20 grams of protein), MVI, Liberalize  Diet ? ?Patient was seen by physical therapy, who recommended Home health, which pt declined as per SW ?On the day of the discharge the patient's vitals were stable, and no other acute medical condition were reported by patient. the patient was felt safe to be discharge at Home. ? ?Consultants: None ?Procedures: none ? ?Discharge Exam: ?General: Appear in no distress, no Rash; Oral Mucosa Clear, moist. ?Cardiovascular: S1 and S2 Present, no Murmur, ?Respiratory: normal respiratory effort, Bilateral Air entry present and mild Crackles, no wheezes ?Abdomen: Bowel Sound present, Soft and no tenderness, no hernia ?Extremities: no Pedal edema, no calf tenderness ?Neurology: alert and oriented to time, place, and person ?affect appropriate. ? ?Filed Weights  ? 07/09/21 2030 07/10/21 1713  ?Weight: 71.2 kg 67.7 kg  ? ?Vitals:  ? 07/15/21 0552 07/15/21 0817  ?BP: 115/76 115/78  ?Pulse: 65 74  ?Resp: 19 20  ?Temp: 98.2 ?F (36.8 ?C) 97.8 ?F (36.6 ?C)  ?SpO2: 92% 94%  ? ? ?DISCHARGE MEDICATION: ?Allergies as of 07/15/2021   ? ?   Reactions  ? Ace Inhibitors Other (See Comments)  ? Hypotension  ? ?  ? ?  ?Medication List  ?  ? ?TAKE these medications   ? ?albuterol 108 (90 Base) MCG/ACT inhaler ?Commonly known as: VENTOLIN HFA ?SMARTSIG:2 Puff(s) By Mouth Every 6 Hours PRN ?  ?aspirin EC 81 MG tablet ?Take 81 mg by mouth every evening. ?  ?famotidine 20 MG tablet ?Commonly known as: PEPCID ?Take 20 mg by mouth daily. ?  ?Fluticasone-Salmeterol 250-50 MCG/DOSE Aepb ?Commonly known as: ADVAIR ?Inhale 1 puff into the lungs 2 (two) times daily. ?  ?ipratropium-albuterol 0.5-2.5 (3) MG/3ML Soln ?Commonly known as: DUONEB ?Take 3 mLs by nebulization every 4 (four) hours as needed. ?  ?levalbuterol 0.31 MG/3ML nebulizer solution ?Commonly known as: XOPENEX ?Take 1 ampule by nebulization every 4 (four) hours as needed for wheezing. ?  ?metFORMIN 750 MG 24 hr tablet ?Commonly known as: GLUCOPHAGE-XR ?Take 750 mg by mouth 2 (two) times  daily. ?  ?metoprolol tartrate 50 MG tablet ?Commonly known as: LOPRESSOR ?Take 50 mg by mouth 2 (two) times daily. ?  ?montelukast 10 MG tablet ?Commonly known as: SINGULAIR ?Take 10 mg by mouth at bedtime. ?  ?nitroGLYCERIN 0.4 MG SL tablet ?Commonly known as: NITROSTAT ?Place 0.4 mg under the tongue every 5 (five) minutes x 3 doses as needed for chest pain. ?  ?polyethylene glycol 17 g packet ?Commonly known as: MIRALAX / GLYCOLAX ?Take 17 g by mouth daily. ?  ?rosuvastatin 40 MG tablet ?Commonly known as: CRESTOR ?Take 40 mg by mouth daily. ?  ?sildenafil 20 MG tablet ?Commonly known as: REVATIO ?Take 20-100 mg by mouth as needed. ?  ?tiotropium 18 MCG inhalation capsule ?Commonly known as: SPIRIVA ?Place 18 mcg into inhaler and inhale daily. ?  ? ?  ? ?Allergies  ?Allergen Reactions  ? Ace Inhibitors Other (See Comments)  ?  Hypotension  ? ?Discharge Instructions   ? ? Call MD for:  difficulty breathing, headache or visual disturbances   Complete by: As directed ?  ? Call MD for:  extreme fatigue   Complete by: As directed ?  ? Call MD for:  persistant dizziness or light-headedness   Complete by: As directed ?  ? Call MD for:  temperature >100.4  Complete by: As directed ?  ? Diet - low sodium heart healthy   Complete by: As directed ?  ? Discharge instructions   Complete by: As directed ?  ? Follow-up with PCP in 1 week, repeat chest x-ray after 4 weeks for resolution of pneumonia.  ? Increase activity slowly   Complete by: As directed ?  ? ?  ? ? ?The results of significant diagnostics from this hospitalization (including imaging, microbiology, ancillary and laboratory) are listed below for reference.   ? ?Significant Diagnostic Studies: ?DG Chest 1 View ? ?Result Date: 07/09/2021 ?CLINICAL DATA:  Shortness of breath. EXAM: CHEST  1 VIEW COMPARISON:  Most recent radiograph 03/01/2021, CT 02/05/2021 FINDINGS: Advanced emphysema. Fiducial markers within a right perihilar density. There is crowding of basilar  bronchovascular markings, however patchy airspace disease in both lower lung zones may be increased from prior exam. Blunting of the costophrenic angles, felt to be related to hyperinflation rather than pleural eff

## 2021-07-15 NOTE — TOC Transition Note (Addendum)
Transition of Care (TOC) - CM/SW Discharge Note ? ? ?Patient Details  ?Name: Bradley [name] ?MRN: [id number] ?Date of Birth: 1951/02/07 ? ?Transition of Care (TOC) CM/SW Contact:  ?Alberteen Sam, LCSW ?Phone Number: ?07/15/2021, 12:07 PM ? ? ?Clinical Narrative:    ? ?Patient to discharge home today, declined home health services reporting he does not feel he needs it. States his daughter will pick him up today at discharge and bring home oxygen.  ? ?Caryl Pina with [employer] informed of dc today, MD informed of need for new oxygen order and RN informed of O2 sat note per [employer]'s request (from handoff by TOC). Patient is discharging on same O2 as baseline through [place].  ? ?No further discharge needs identified.  ? ? ?Final next level of care: Home/Self Care ?Barriers to Discharge: No Barriers Identified ? ? ?Patient Goals and CMS Choice ?Patient states their goals for this hospitalization and ongoing recovery are:: to go home ?CMS Medicare.gov Compare Post Acute Care list provided to:: Patient ?Choice offered to / list presented to : Patient ? ?Discharge Placement ?  ?           ?  ?  ?  ?  ? ?Discharge Plan and Services ?  ?  ?Post Acute Care Choice: NA          ?  ?  ?  ?  ?  ?  ?  ?  ?  ?  ? ?Social Determinants of Health (SDOH) Interventions ?  ? ? ?Readmission Risk Interventions ? ?  02/07/2021  ?  3:13 PM 07/31/2020  ? 10:04 AM  ?Readmission Risk Prevention Plan  ?Transportation Screening Complete Complete  ?PCP or Specialist Appt within 3-5 Days Complete Complete  ?[place] or Home Care Consult Complete Complete  ?Social Work Consult for [place] Planning/Counseling Complete Not Complete  ?SW consult not completed comments  RNCM assigned to patient  ?Palliative Care Screening Not Applicable Not Applicable  ?Medication Review Press photographer) Complete Complete  ? ? ? ? ? ?

## 2021-07-27 ENCOUNTER — Other Ambulatory Visit: Payer: Self-pay | Admitting: Specialist

## 2021-07-27 DIAGNOSIS — R918 Other nonspecific abnormal finding of lung field: Secondary | ICD-10-CM

## 2021-08-03 ENCOUNTER — Ambulatory Visit
Admission: RE | Admit: 2021-08-03 | Discharge: 2021-08-03 | Disposition: A | Discharge status: Home / Self Care | Payer: Medicare HMO | Source: Ambulatory Visit | Attending: Specialist | Admitting: Specialist

## 2021-08-03 DIAGNOSIS — R918 Other nonspecific abnormal finding of lung field: Secondary | ICD-10-CM

## 2021-08-16 ENCOUNTER — Other Ambulatory Visit: Payer: Self-pay | Admitting: Specialist

## 2021-08-16 DIAGNOSIS — R918 Other nonspecific abnormal finding of lung field: Secondary | ICD-10-CM

## 2021-08-28 ENCOUNTER — Ambulatory Visit
Admission: RE | Admit: 2021-08-28 | Discharge: 2021-08-28 | Disposition: A | Discharge status: Home / Self Care | Payer: Medicare HMO | Source: Ambulatory Visit | Attending: Specialist | Admitting: Specialist

## 2021-08-28 DIAGNOSIS — R918 Other nonspecific abnormal finding of lung field: Secondary | ICD-10-CM | POA: Insufficient documentation

## 2021-08-28 DIAGNOSIS — J439 Emphysema, unspecified: Secondary | ICD-10-CM | POA: Diagnosis not present

## 2021-08-28 LAB — GLUCOSE, CAPILLARY: Glucose-Capillary: 179 mg/dL — ABNORMAL HIGH (ref 70–99)

## 2021-08-28 MED ORDER — FLUDEOXYGLUCOSE F - 18 (FDG) INJECTION
7.7000 | Freq: Once | INTRAVENOUS | Status: AC | PRN
  Administered 2021-08-28: 8.09 via INTRAVENOUS

## 2021-09-21 ENCOUNTER — Other Ambulatory Visit: Payer: Self-pay | Admitting: Pulmonary Disease

## 2021-09-21 DIAGNOSIS — R918 Other nonspecific abnormal finding of lung field: Secondary | ICD-10-CM

## 2021-09-25 ENCOUNTER — Encounter (HOSPITAL_COMMUNITY): Payer: Self-pay | Admitting: Urgent Care

## 2021-09-25 ENCOUNTER — Telehealth: Payer: Self-pay | Admitting: Urgent Care

## 2021-09-25 ENCOUNTER — Ambulatory Visit
Admission: RE | Admit: 2021-09-25 | Discharge: 2021-09-25 | Disposition: A | Discharge status: Home / Self Care | Payer: Medicare HMO | Source: Ambulatory Visit | Attending: Pulmonary Disease | Admitting: Pulmonary Disease

## 2021-09-25 DIAGNOSIS — R918 Other nonspecific abnormal finding of lung field: Secondary | ICD-10-CM | POA: Insufficient documentation

## 2021-09-25 NOTE — Progress Notes (Signed)
  Perioperative Services Pre-Admission/Anesthesia Testing    Date: 09/25/21  Name: Bradley [name] MRN:   [id number]  Re: Plans for upcoming bronchoscopy (ASA hold)  Planned Surgical Procedure(s):  Patient is scheduled for bronchoscopy on 10/05/2021 with Dr. Ottie Glazier, MD. family member calling [facility] today advising that no one has told them to stop her blood thinners.  STS charge nurse called PAT to determine need for patient for upcoming procedure.  In review of the EMR, patient has not had his PAT appointment yet.  He is scheduled for 09/27/2021 for a phone call.  Charge nurse was advised that patient and/or family would be given all pertinent information at this time, however I advised that call would be placed to family member to discuss concerns regarding anticoagulation medications. Prior to phone call, medication reconciliation was personally reviewed.  Upon review of patient's medication list, it is noted that he is not currently taking any type of anticoagulation therapies.  However, patient is on a daily low-dose ASA.  Generally for bronchoscopies, ASA is held for 5 days prior to procedure. Notes from pulmonary medicine provider reviewed which indicated that patient was contacted on 09/21/2021 by provider's nurse [name], RN), at which time he was advised to stop his ASA for 5 days before coming in for his procedure.   PAT secondary asked to contact patient/family to advise that all pertinent information will be discussed during PAT, on 09/27/2021.  I did ask secretary to advise patient on recommendations for stopping his antiplatelet medication.  His last dose of ASA should be on 09/29/2021.  We will plan on discussing further at PAT appointment, at which time he received this information in writing.  Patient was advised to return call to PAT clinic and/or pulmonologist's office with any further questions or concerns prior to his procedure; verbalized understanding.  Honor Loh, MSN, APRN,  FNP-C, CEN [facility]  Peri-operative Services Nurse Practitioner Phone: [phone] 09/25/21 12:26 PM  NOTE: This note has been prepared using Dragon dictation software. Despite my best ability to proofread, there is always the potential that unintentional transcriptional errors may still occur from this process.

## 2021-09-27 ENCOUNTER — Encounter
Admission: RE | Admit: 2021-09-27 | Discharge: 2021-09-27 | Disposition: A | Discharge status: Home / Self Care | Payer: Medicare HMO | Source: Ambulatory Visit | Attending: Pulmonary Disease | Admitting: Pulmonary Disease

## 2021-09-27 DIAGNOSIS — Z79899 Other long term (current) drug therapy: Secondary | ICD-10-CM

## 2021-09-27 DIAGNOSIS — Z01818 Encounter for other preprocedural examination: Secondary | ICD-10-CM

## 2021-09-27 DIAGNOSIS — E119 Type 2 diabetes mellitus without complications: Secondary | ICD-10-CM

## 2021-09-27 DIAGNOSIS — R0602 Shortness of breath: Secondary | ICD-10-CM

## 2021-09-27 DIAGNOSIS — D509 Iron deficiency anemia, unspecified: Secondary | ICD-10-CM

## 2021-09-27 DIAGNOSIS — N183 Chronic kidney disease, stage 3 unspecified: Secondary | ICD-10-CM

## 2021-09-27 DIAGNOSIS — I10 Essential (primary) hypertension: Secondary | ICD-10-CM

## 2021-09-27 HISTORY — DX: Family history of other specified conditions: Z84.89

## 2021-09-27 HISTORY — DX: Hypo-osmolality and hyponatremia: E87.1

## 2021-09-27 HISTORY — DX: Chronic kidney disease, stage 2 (mild): N18.2

## 2021-09-27 HISTORY — DX: Peripheral vascular disease, unspecified: I73.9

## 2021-09-27 HISTORY — DX: Pneumonia, unspecified organism: J18.9

## 2021-09-27 HISTORY — DX: Depression, unspecified: F32.A

## 2021-09-27 HISTORY — DX: Hyperlipidemia, unspecified: E78.5

## 2021-09-27 HISTORY — DX: Other nonspecific abnormal finding of lung field: R91.8

## 2021-09-27 HISTORY — DX: Personal history of nicotine dependence: Z87.891

## 2021-09-27 HISTORY — DX: Occlusion and stenosis of left carotid artery: I65.22

## 2021-09-27 HISTORY — DX: Tachycardia, unspecified: R00.0

## 2021-09-27 HISTORY — DX: Other reaction to spinal and lumbar puncture: G97.1

## 2021-09-27 HISTORY — DX: Anemia, unspecified: D64.9

## 2021-09-27 HISTORY — DX: Sleep apnea, unspecified: G47.30

## 2021-09-27 HISTORY — DX: Alcohol use, unspecified, uncomplicated: F10.90

## 2021-09-27 HISTORY — DX: Anxiety disorder, unspecified: F41.9

## 2021-09-27 HISTORY — DX: Unspecified protein-calorie malnutrition: E46

## 2021-09-27 NOTE — Patient Instructions (Signed)
Your procedure is scheduled on:10-05-21 Friday Report to the Registration Desk on the 1st floor of the [facility].Then proceed to the 2nd floor Surgery Desk To find out your arrival time, please call [phone] between 1PM - 3PM on:10-04-21 Thursday If your arrival time is 6:00 am, do not arrive prior to that time as the [facility] entrance doors do not open until 6:00 am.  REMEMBER: Instructions that are not followed completely may result in serious medical risk, up to and including death; or upon the discretion of your surgeon and anesthesiologist your surgery may need to be rescheduled.  Do not eat food after midnight the night before surgery.  No gum chewing, lozengers or hard candies.  You may however, drink Water up to 2 hours before you are scheduled to arrive for your surgery. Do not drink anything within 2 hours of your scheduled arrival time.  TAKE THESE MEDICATIONS THE MORNING OF SURGERY WITH A SIP OF WATER: -metoprolol tartrate (LOPRESSOR)  -rosuvastatin (CRESTOR)  Last dose of 81 mg Aspirin will be on 09-29-21 Saturday as instructed by Dr Lanney Gins  Stop your metFORMIN (GLUCOPHAGE-XR) 2 days prior to surgery-Last dose will be on 10-02-21 Tuesday  Use your Spiriva and Advair Inhaler along with your Albuterol Nebulizer the morning of surgery. Bring your Albuterol Inhaler to the hospital  One week prior to surgery: Stop Anti-inflammatories (NSAIDS) such as Advil, Aleve, Ibuprofen, Motrin, Naproxen, Naprosyn and Aspirin based products such as Excedrin, Goodys Powder, BC Powder.You may however, take Tylenol if needed for pain up until the day of surgery.  Stop ANY OVER THE COUNTER supplements/vitamins NOW (09-27-21) until after surgery.  No Alcohol for 24 hours before or after surgery.  No Smoking including e-cigarettes for 24 hours prior to surgery.  No chewable tobacco products for at least 6 hours prior to surgery.  No nicotine patches on the day of surgery.  Do not use  any "recreational" drugs for at least a week prior to your surgery.  Please be advised that the combination of cocaine and anesthesia may have negative outcomes, up to and including death. If you test positive for cocaine, your surgery will be cancelled.  On the morning of surgery brush your teeth with toothpaste and water, you may rinse your mouth with mouthwash if you wish. Do not swallow any toothpaste or mouthwash.  Do not wear jewelry, make-up, hairpins, clips or nail polish.  Do not wear lotions, powders, or perfumes.   Do not shave body from the neck down 48 hours prior to surgery just in case you cut yourself which could leave a site for infection.  Also, freshly shaved skin may become irritated if using the CHG soap.  Contact lenses, hearing aids and dentures may not be worn into surgery.  Do not bring valuables to the hospital. [facility] is not responsible for any missing/lost belongings or valuables.   Notify your doctor if there is any change in your medical condition (cold, fever, infection).  Wear comfortable clothing (specific to your surgery type) to the hospital.  After surgery, you can help prevent lung complications by doing breathing exercises.  Take deep breaths and cough every 1-2 hours. Your doctor may order a device called an Incentive Spirometer to help you take deep breaths. When coughing or sneezing, hold a pillow firmly against your incision with both hands. This is called "splinting." Doing this helps protect your incision. It also decreases belly discomfort.  If you are being admitted to the hospital  overnight, leave your suitcase in the car. After surgery it may be brought to your room.  If you are being discharged the day of surgery, you will not be allowed to drive home. You will need a responsible adult (18 years or older) to drive you home and stay with you that night.   If you are taking public transportation, you will need to have a responsible  adult (18 years or older) with you. Please confirm with your physician that it is acceptable to use public transportation.   Please call the [facility] at [phone] if you have any questions about these instructions.  Surgery Visitation Policy:  Patients undergoing a surgery or procedure may have two family members or support persons with them as long as the person is not COVID-19 positive or experiencing its symptoms.

## 2021-10-02 ENCOUNTER — Telehealth: Payer: Self-pay | Admitting: Urgent Care

## 2021-10-02 NOTE — Progress Notes (Signed)
  Perioperative Services Pre-Admission/Anesthesia Testing    Date: 10/02/21  Name: Bradley [name] MRN:   [id number]  Re: Plans for surgery  Clinical Notes:  Patient scheduled for bronchoscopy on 10/05/2021 with Dr. Ottie Glazier, MD. patient underwent repeat CT imaging on 09/25/2021 that demonstrated the following:  1.7 cm posterior segment right upper lobe nodule, previously 1.5 cm but with only slight FDG PET uptake.  1.6 cm posterolateral right basal nodule, on last year's study 02/05/2021 was 1.3 cm and on an older study of 12/06/2020 was 10.7 mm, with borderline hypermetabolic FDG PET uptake. Slow growing bronchogenic neoplasm is not excluded. Continued short interval follow-up imaging or tissue sampling recommended.  Severe emphysematous disease, old right lower lobectomy, and scarring changes with additional stable 5 mm nodules 1 on each side.  Aortic and coronary artery atherosclerosis and lipomatous hypertrophy of the intra-atrial septum.  Stable slightly prominent mediastinal and hilar nodes.  Per patient's primary attending surgeon, CT stable and there is currently nothing to biopsy at this time.  Plans are to repeat CT imaging in 3 months.  Procedure for Friday (10/05/2021) has been canceled.  PAT secretary asked to cancel appointment for preoperative labs and ECG scheduled for 10/03/2021.  Office has made patient aware that procedure and associated preprocedural visits have been canceled.  No further actions required at this time.  Honor Loh, MSN, APRN, FNP-C, CEN [facility]  Peri-operative Services Nurse Practitioner Phone: [phone] 10/02/21 1:37 PM  NOTE: This note has been prepared using Dragon dictation software. Despite my best ability to proofread, there is always the potential that unintentional transcriptional errors may still occur from this process.

## 2021-10-03 ENCOUNTER — Inpatient Hospital Stay: Admission: RE | Admit: 2021-10-03 | Payer: Medicare HMO | Source: Ambulatory Visit

## 2021-10-05 ENCOUNTER — Encounter: Admission: RE | Payer: Self-pay | Source: Ambulatory Visit

## 2021-10-05 ENCOUNTER — Ambulatory Visit: Admission: RE | Admit: 2021-10-05 | Payer: Medicare HMO | Source: Ambulatory Visit

## 2021-10-05 SURGERY — BRONCHOSCOPY, WITH EBUS
Anesthesia: General

## 2021-11-28 ENCOUNTER — Other Ambulatory Visit: Payer: Self-pay | Admitting: Pulmonary Disease

## 2021-11-28 ENCOUNTER — Other Ambulatory Visit (HOSPITAL_COMMUNITY): Payer: Self-pay | Admitting: Pulmonary Disease

## 2021-11-28 DIAGNOSIS — R918 Other nonspecific abnormal finding of lung field: Secondary | ICD-10-CM

## 2021-12-06 ENCOUNTER — Ambulatory Visit: Payer: Medicare HMO

## 2022-01-01 ENCOUNTER — Ambulatory Visit
Admission: RE | Admit: 2022-01-01 | Discharge: 2022-01-01 | Disposition: A | Discharge status: Home / Self Care | Payer: Medicare HMO | Source: Ambulatory Visit | Attending: Pulmonary Disease | Admitting: Pulmonary Disease

## 2022-01-01 DIAGNOSIS — R918 Other nonspecific abnormal finding of lung field: Secondary | ICD-10-CM | POA: Insufficient documentation

## 2022-01-28 ENCOUNTER — Encounter: Payer: Self-pay | Admitting: Radiation Oncology

## 2022-01-28 ENCOUNTER — Ambulatory Visit
Admission: RE | Admit: 2022-01-28 | Discharge: 2022-01-28 | Disposition: A | Discharge status: Home / Self Care | Payer: Medicare HMO | Source: Ambulatory Visit | Attending: Radiation Oncology | Admitting: Radiation Oncology

## 2022-01-28 VITALS — BP 97/64 | HR 68 | Temp 98.0°F | Resp 18 | Ht 67.0 in | Wt 160.0 lb

## 2022-01-28 DIAGNOSIS — I6522 Occlusion and stenosis of left carotid artery: Secondary | ICD-10-CM | POA: Insufficient documentation

## 2022-01-28 DIAGNOSIS — N182 Chronic kidney disease, stage 2 (mild): Secondary | ICD-10-CM | POA: Diagnosis not present

## 2022-01-28 DIAGNOSIS — Z7951 Long term (current) use of inhaled steroids: Secondary | ICD-10-CM | POA: Diagnosis not present

## 2022-01-28 DIAGNOSIS — J432 Centrilobular emphysema: Secondary | ICD-10-CM | POA: Diagnosis not present

## 2022-01-28 DIAGNOSIS — E871 Hypo-osmolality and hyponatremia: Secondary | ICD-10-CM | POA: Insufficient documentation

## 2022-01-28 DIAGNOSIS — R000 Tachycardia, unspecified: Secondary | ICD-10-CM | POA: Insufficient documentation

## 2022-01-28 DIAGNOSIS — E119 Type 2 diabetes mellitus without complications: Secondary | ICD-10-CM | POA: Insufficient documentation

## 2022-01-28 DIAGNOSIS — I2510 Atherosclerotic heart disease of native coronary artery without angina pectoris: Secondary | ICD-10-CM | POA: Diagnosis not present

## 2022-01-28 DIAGNOSIS — I739 Peripheral vascular disease, unspecified: Secondary | ICD-10-CM | POA: Diagnosis not present

## 2022-01-28 DIAGNOSIS — I119 Hypertensive heart disease without heart failure: Secondary | ICD-10-CM | POA: Diagnosis not present

## 2022-01-28 DIAGNOSIS — G4730 Sleep apnea, unspecified: Secondary | ICD-10-CM | POA: Diagnosis not present

## 2022-01-28 DIAGNOSIS — Z87891 Personal history of nicotine dependence: Secondary | ICD-10-CM | POA: Insufficient documentation

## 2022-01-28 DIAGNOSIS — E785 Hyperlipidemia, unspecified: Secondary | ICD-10-CM | POA: Diagnosis not present

## 2022-01-28 DIAGNOSIS — E46 Unspecified protein-calorie malnutrition: Secondary | ICD-10-CM | POA: Insufficient documentation

## 2022-01-28 DIAGNOSIS — R918 Other nonspecific abnormal finding of lung field: Secondary | ICD-10-CM | POA: Diagnosis present

## 2022-01-28 DIAGNOSIS — D649 Anemia, unspecified: Secondary | ICD-10-CM | POA: Diagnosis not present

## 2022-01-28 DIAGNOSIS — Z79899 Other long term (current) drug therapy: Secondary | ICD-10-CM | POA: Insufficient documentation

## 2022-01-28 DIAGNOSIS — Z809 Family history of malignant neoplasm, unspecified: Secondary | ICD-10-CM | POA: Diagnosis not present

## 2022-01-28 DIAGNOSIS — Z7984 Long term (current) use of oral hypoglycemic drugs: Secondary | ICD-10-CM | POA: Insufficient documentation

## 2022-01-28 DIAGNOSIS — Z7982 Long term (current) use of aspirin: Secondary | ICD-10-CM | POA: Insufficient documentation

## 2022-01-28 NOTE — Consult Note (Signed)
NEW PATIENT EVALUATION  Name: Bradley [name]  MRN: [id number]  Date:   01/28/2022     DOB: [birth year]   This 71 y.o. male patient presents to the clinic for initial evaluation of stage I non-small cell lung cancer of the right lower lobe and patient with extensive COPD emphysema.  REFERRING PHYSICIAN: Valera Castle, *  CHIEF COMPLAINT:  Chief Complaint  Patient presents with   Lung Cancer    Follow up    DIAGNOSIS: The encounter diagnosis was Lung mass.   PREVIOUS INVESTIGATIONS:  CT scan PET CT scan reviewed Clinical notes reviewed Labs reviewed  HPI: Patient is a 71 year old male with significant COPD emphysema.  He was noted to have a 1.7 cm mass in the right upper lobe but only slight FDG uptake.  He also has a right lower lobe nodule with borderline hypermetabolic activity which over time has been slow-growing on serial CT scans.  Most recent PET scan back in June there was an enlarging 1.3 cm mass in the right lower lobe with slight borderline hypermetabolic activity.  On his most recent CT scan back in October there was slight growth to 1.8 cm again 6 concerning for slow-growing neoplasm.  Other previous noted pulmonary nodules were generally stable over time.  He has significant centrilobular and paraseptal emphysema.  He is too high risk for biopsy or navigational bronc bronchoscopy.  He has slight cough no hemoptysis or chest tightness he is now referred to radiation oncology for opinion.  PLANNED TREATMENT REGIMEN: SBRT  PAST MEDICAL HISTORY:  has a past medical history of Alcohol use disorder, Anemia, Anxiety, COPD (chronic obstructive pulmonary disease) ([place]), Coronary artery disease, Depression, Diabetes mellitus without complication ([place]), Family history of adverse reaction to anesthesia, History of tobacco abuse, Hyperlipidemia, Hypertension, Hyponatremia, Left carotid artery stenosis, Lung mass, Malnutrition ([place]), PAD (peripheral artery disease) ([place]), Pneumonia,  Sleep apnea, Spinal headache, Stage 2 chronic kidney disease, and Tachycardia.    PAST SURGICAL HISTORY:  Past Surgical History:  Procedure Laterality Date   CAROTID STENT Left    LEFT HEART CATH AND CORONARY ANGIOGRAPHY Left 11/02/2019   Procedure: LEFT HEART CATH AND CORONARY ANGIOGRAPHY;  Surgeon: Teodoro Spray, MD;  Location: [facility];  Service: Cardiovascular;  Laterality: Left;   LUNG BIOPSY Right    2006   right side partial lobe removed   1990    FAMILY HISTORY: family history includes Cancer in his mother and sister; Heart attack in his father.  SOCIAL HISTORY:  reports that he quit smoking about 6 years ago. His smoking use included cigarettes. He has a 52.00 pack-year smoking history. He has never used smokeless tobacco. He reports that he does not currently use alcohol after a past usage of about 3.0 standard drinks of alcohol per week. He reports that he does not use drugs.  ALLERGIES: Ace inhibitors  MEDICATIONS:  Current Outpatient Medications  Medication Sig Dispense Refill   albuterol (VENTOLIN HFA) 108 (90 Base) MCG/ACT inhaler 2 puffs every 6 (six) hours as needed.     aspirin EC 81 MG tablet Take 81 mg by mouth every evening.      brimonidine (ALPHAGAN) 0.2 % ophthalmic solution Place 1 drop into both eyes 2 (two) times daily.     Fluticasone-Salmeterol (ADVAIR) 250-50 MCG/DOSE AEPB Inhale 1 puff into the lungs 2 (two) times daily. 60 each 0   furosemide (LASIX) 20 MG tablet Take 20 mg by mouth as needed for edema.  ipratropium-albuterol (DUONEB) 0.5-2.5 (3) MG/3ML SOLN Take 3 mLs by nebulization every 4 (four) hours as needed. 360 mL 2   levalbuterol (XOPENEX) 0.31 MG/3ML nebulizer solution Take 1 ampule by nebulization every 4 (four) hours as needed for wheezing.     linagliptin (TRADJENTA) 5 MG TABS tablet Take 5 mg by mouth every morning.     metFORMIN (GLUCOPHAGE-XR) 750 MG 24 hr tablet Take 750 mg by mouth 2 (two) times daily.     metoprolol  tartrate (LOPRESSOR) 50 MG tablet Take 50 mg by mouth 2 (two) times daily.     montelukast (SINGULAIR) 10 MG tablet Take 10 mg by mouth at bedtime.     nitroGLYCERIN (NITROSTAT) 0.4 MG SL tablet Place 0.4 mg under the tongue every 5 (five) minutes x 3 doses as needed for chest pain.      OXYGEN Inhale 2.5-3 L into the lungs continuous.     polyethylene glycol (MIRALAX / GLYCOLAX) 17 g packet Take 17 g by mouth daily as needed.     rosuvastatin (CRESTOR) 40 MG tablet Take 40 mg by mouth every morning.     sildenafil (REVATIO) 20 MG tablet Take 20-100 mg by mouth as needed.     tiotropium (SPIRIVA) 18 MCG inhalation capsule Place 18 mcg into inhaler and inhale every morning.     No current facility-administered medications for this encounter.   Facility-Administered Medications Ordered in Other Encounters  Medication Dose Route Frequency Provider Last Rate Last Admin   sodium chloride flush (NS) 0.9 % injection 3 mL  3 mL Intravenous Q12H Teodoro Spray, MD        ECOG PERFORMANCE STATUS:  0 - Asymptomatic  REVIEW OF SYSTEMS: Patient denies any weight loss, fatigue, weakness, fever, chills or night sweats. Patient denies any loss of vision, blurred vision. Patient denies any ringing  of the ears or hearing loss. No irregular heartbeat. Patient denies heart murmur or history of fainting. Patient denies any chest pain or pain radiating to her upper extremities. Patient denies any shortness of breath, difficulty breathing at night, cough or hemoptysis. Patient denies any swelling in the lower legs. Patient denies any nausea vomiting, vomiting of blood, or coffee ground material in the vomitus. Patient denies any stomach pain. Patient states has had normal bowel movements no significant constipation or diarrhea. Patient denies any dysuria, hematuria or significant nocturia. Patient denies any problems walking, swelling in the joints or loss of balance. Patient denies any skin changes, loss of hair or  loss of weight. Patient denies any excessive worrying or anxiety or significant depression. Patient denies any problems with insomnia. Patient denies excessive thirst, polyuria, polydipsia. Patient denies any swollen glands, patient denies easy bruising or easy bleeding. Patient denies any recent infections, allergies or URI. Patient "s visual fields have not changed significantly in recent time.   PHYSICAL EXAM: BP 97/64   Pulse 68   Temp 98 F (36.7 C)   Resp 18   Ht 5\' 7"  (1.702 m)   Wt 160 lb (72.6 kg)   BMI 25.06 kg/m  Well-developed male on continuous nasal oxygen in NAD.  Well-developed well-nourished patient in NAD. HEENT reveals PERLA, EOMI, discs not visualized.  Oral cavity is clear. No oral mucosal lesions are identified. Neck is clear without evidence of cervical or supraclavicular adenopathy. Lungs are clear to A&P. Cardiac examination is essentially unremarkable with regular rate and rhythm without murmur rub or thrill. Abdomen is benign with no organomegaly or masses noted. Motor  sensory and DTR levels are equal and symmetric in the upper and lower extremities. Cranial nerves II through XII are grossly intact. Proprioception is intact. No peripheral adenopathy or edema is identified. No motor or sensory levels are noted. Crude visual fields are within normal range.  LABORATORY DATA: Labs were reviewed   RADIOLOGY RESULTS: Serial CT scans and PET CT scan reviewed compatible with above-stated findings   IMPRESSION: Non-small cell lung cancer of the right lower lobe progressing over time and borderline hypermetabolic suggestive of primary bronchogenic carcinoma in 71 year old male with's significant COPD emphysema.  PLAN: At this time I have offered SBRT to his right lower lobe mass.  Will plan on delivering 60 Gray in 5 fractions.  We use for dimensional treatment planning is most well as motion restriction in his treatment planning.  Risks and benefits of treatment including  alteration of normal lung volume possible development of cough and slight fatigue all were discussed in detail with the patient.  He comprehends my recommendations well.  I have personally set up and ordered CT simulation for later this week.  I would like to take this opportunity to thank you for allowing me to participate in the care of your patient.Noreene Filbert, MD

## 2022-02-04 ENCOUNTER — Ambulatory Visit
Admission: RE | Admit: 2022-02-04 | Discharge: 2022-02-04 | Disposition: A | Discharge status: Home / Self Care | Payer: Medicare HMO | Source: Ambulatory Visit | Attending: Radiation Oncology | Admitting: Radiation Oncology

## 2022-02-04 DIAGNOSIS — C3431 Malignant neoplasm of lower lobe, right bronchus or lung: Secondary | ICD-10-CM | POA: Insufficient documentation

## 2022-02-04 DIAGNOSIS — Z510 Encounter for antineoplastic radiation therapy: Secondary | ICD-10-CM | POA: Insufficient documentation

## 2022-02-07 DIAGNOSIS — Z510 Encounter for antineoplastic radiation therapy: Secondary | ICD-10-CM | POA: Diagnosis not present

## 2022-02-14 ENCOUNTER — Ambulatory Visit
Admission: RE | Admit: 2022-02-14 | Discharge: 2022-02-14 | Disposition: A | Discharge status: Home / Self Care | Payer: Medicare HMO | Source: Ambulatory Visit | Attending: Radiation Oncology | Admitting: Radiation Oncology

## 2022-02-14 ENCOUNTER — Other Ambulatory Visit: Payer: Self-pay

## 2022-02-14 DIAGNOSIS — Z510 Encounter for antineoplastic radiation therapy: Secondary | ICD-10-CM | POA: Diagnosis not present

## 2022-02-14 LAB — RAD ONC ARIA SESSION SUMMARY
Course Elapsed Days: 0
Plan Fractions Treated to Date: 1
Plan Prescribed Dose Per Fraction: 12 Gy
Plan Total Fractions Prescribed: 5
Plan Total Prescribed Dose: 60 Gy
Reference Point Dosage Given to Date: 12 Gy
Reference Point Session Dosage Given: 12 Gy
Session Number: 1

## 2022-02-17 ENCOUNTER — Encounter: Payer: Self-pay | Admitting: Internal Medicine

## 2022-02-17 ENCOUNTER — Inpatient Hospital Stay
Admission: EM | Admit: 2022-02-17 | Discharge: 2022-02-21 | DRG: 871 | Disposition: A | Discharge status: Home / Self Care | Payer: Medicare HMO | Attending: Internal Medicine | Admitting: Internal Medicine

## 2022-02-17 ENCOUNTER — Inpatient Hospital Stay: Payer: Medicare HMO

## 2022-02-17 ENCOUNTER — Emergency Department: Payer: Medicare HMO

## 2022-02-17 ENCOUNTER — Other Ambulatory Visit: Payer: Self-pay

## 2022-02-17 DIAGNOSIS — E1122 Type 2 diabetes mellitus with diabetic chronic kidney disease: Secondary | ICD-10-CM | POA: Diagnosis present

## 2022-02-17 DIAGNOSIS — D649 Anemia, unspecified: Secondary | ICD-10-CM | POA: Diagnosis present

## 2022-02-17 DIAGNOSIS — F419 Anxiety disorder, unspecified: Secondary | ICD-10-CM | POA: Diagnosis present

## 2022-02-17 DIAGNOSIS — F1010 Alcohol abuse, uncomplicated: Secondary | ICD-10-CM | POA: Diagnosis present

## 2022-02-17 DIAGNOSIS — J101 Influenza due to other identified influenza virus with other respiratory manifestations: Secondary | ICD-10-CM | POA: Diagnosis present

## 2022-02-17 DIAGNOSIS — Z923 Personal history of irradiation: Secondary | ICD-10-CM

## 2022-02-17 DIAGNOSIS — Z888 Allergy status to other drugs, medicaments and biological substances status: Secondary | ICD-10-CM | POA: Diagnosis not present

## 2022-02-17 DIAGNOSIS — F32A Depression, unspecified: Secondary | ICD-10-CM | POA: Diagnosis present

## 2022-02-17 DIAGNOSIS — R6520 Severe sepsis without septic shock: Secondary | ICD-10-CM | POA: Diagnosis present

## 2022-02-17 DIAGNOSIS — N1831 Chronic kidney disease, stage 3a: Secondary | ICD-10-CM | POA: Diagnosis present

## 2022-02-17 DIAGNOSIS — E1129 Type 2 diabetes mellitus with other diabetic kidney complication: Secondary | ICD-10-CM | POA: Diagnosis present

## 2022-02-17 DIAGNOSIS — Z79899 Other long term (current) drug therapy: Secondary | ICD-10-CM

## 2022-02-17 DIAGNOSIS — R197 Diarrhea, unspecified: Secondary | ICD-10-CM | POA: Diagnosis present

## 2022-02-17 DIAGNOSIS — Z1152 Encounter for screening for COVID-19: Secondary | ICD-10-CM | POA: Diagnosis not present

## 2022-02-17 DIAGNOSIS — Z8249 Family history of ischemic heart disease and other diseases of the circulatory system: Secondary | ICD-10-CM

## 2022-02-17 DIAGNOSIS — A419 Sepsis, unspecified organism: Secondary | ICD-10-CM

## 2022-02-17 DIAGNOSIS — Z87891 Personal history of nicotine dependence: Secondary | ICD-10-CM | POA: Diagnosis not present

## 2022-02-17 DIAGNOSIS — I129 Hypertensive chronic kidney disease with stage 1 through stage 4 chronic kidney disease, or unspecified chronic kidney disease: Secondary | ICD-10-CM | POA: Diagnosis present

## 2022-02-17 DIAGNOSIS — I2510 Atherosclerotic heart disease of native coronary artery without angina pectoris: Secondary | ICD-10-CM | POA: Diagnosis present

## 2022-02-17 DIAGNOSIS — A4189 Other specified sepsis: Principal | ICD-10-CM | POA: Diagnosis present

## 2022-02-17 DIAGNOSIS — I10 Essential (primary) hypertension: Secondary | ICD-10-CM | POA: Diagnosis present

## 2022-02-17 DIAGNOSIS — J441 Chronic obstructive pulmonary disease with (acute) exacerbation: Secondary | ICD-10-CM | POA: Diagnosis present

## 2022-02-17 DIAGNOSIS — E872 Acidosis, unspecified: Secondary | ICD-10-CM | POA: Diagnosis present

## 2022-02-17 DIAGNOSIS — R112 Nausea with vomiting, unspecified: Secondary | ICD-10-CM | POA: Diagnosis present

## 2022-02-17 DIAGNOSIS — Z85118 Personal history of other malignant neoplasm of bronchus and lung: Secondary | ICD-10-CM | POA: Diagnosis not present

## 2022-02-17 DIAGNOSIS — E785 Hyperlipidemia, unspecified: Secondary | ICD-10-CM | POA: Diagnosis present

## 2022-02-17 DIAGNOSIS — Z9981 Dependence on supplemental oxygen: Secondary | ICD-10-CM

## 2022-02-17 DIAGNOSIS — E1151 Type 2 diabetes mellitus with diabetic peripheral angiopathy without gangrene: Secondary | ICD-10-CM | POA: Diagnosis present

## 2022-02-17 DIAGNOSIS — J9621 Acute and chronic respiratory failure with hypoxia: Secondary | ICD-10-CM | POA: Diagnosis present

## 2022-02-17 DIAGNOSIS — C3491 Malignant neoplasm of unspecified part of right bronchus or lung: Secondary | ICD-10-CM | POA: Diagnosis not present

## 2022-02-17 DIAGNOSIS — C3490 Malignant neoplasm of unspecified part of unspecified bronchus or lung: Secondary | ICD-10-CM | POA: Diagnosis present

## 2022-02-17 HISTORY — DX: Nausea with vomiting, unspecified: R11.2

## 2022-02-17 LAB — COMPREHENSIVE METABOLIC PANEL
ALT: 19 U/L (ref 0–44)
AST: 26 U/L (ref 15–41)
Albumin: 3.5 g/dL (ref 3.5–5.0)
Alkaline Phosphatase: 62 U/L (ref 38–126)
Anion gap: 14 (ref 5–15)
BUN: 17 mg/dL (ref 8–23)
CO2: 19 mmol/L — ABNORMAL LOW (ref 22–32)
Calcium: 8.4 mg/dL — ABNORMAL LOW (ref 8.9–10.3)
Chloride: 105 mmol/L (ref 98–111)
Creatinine, Ser: 1.3 mg/dL — ABNORMAL HIGH (ref 0.61–1.24)
GFR, Estimated: 58 mL/min — ABNORMAL LOW (ref >60)
Glucose, Bld: 227 mg/dL — ABNORMAL HIGH (ref 70–99)
Potassium: 4.1 mmol/L (ref 3.5–5.1)
Sodium: 138 mmol/L (ref 135–145)
Total Bilirubin: 1 mg/dL (ref 0.3–1.2)
Total Protein: 7.1 g/dL (ref 6.5–8.1)

## 2022-02-17 LAB — CBC WITH DIFFERENTIAL/PLATELET
Abs Immature Granulocytes: 0.05 10*3/uL (ref 0.00–0.07)
Basophils Absolute: 0 10*3/uL (ref 0.0–0.1)
Basophils Relative: 0 %
Eosinophils Absolute: 0 10*3/uL (ref 0.0–0.5)
Eosinophils Relative: 0 %
HCT: 51.4 % (ref 39.0–52.0)
Hemoglobin: 16.2 g/dL (ref 13.0–17.0)
Immature Granulocytes: 1 %
Lymphocytes Relative: 5 %
Lymphs Abs: 0.5 10*3/uL — ABNORMAL LOW (ref 0.7–4.0)
MCH: 28.9 pg (ref 26.0–34.0)
MCHC: 31.5 g/dL (ref 30.0–36.0)
MCV: 91.6 fL (ref 80.0–100.0)
Monocytes Absolute: 0.9 10*3/uL (ref 0.1–1.0)
Monocytes Relative: 8 %
Neutro Abs: 8.9 10*3/uL — ABNORMAL HIGH (ref 1.7–7.7)
Neutrophils Relative %: 86 %
Platelets: 177 10*3/uL (ref 150–400)
RBC: 5.61 MIL/uL (ref 4.22–5.81)
RDW: 16.2 % — ABNORMAL HIGH (ref 11.5–15.5)
WBC: 10.3 10*3/uL (ref 4.0–10.5)
nRBC: 0 % (ref 0.0–0.2)

## 2022-02-17 LAB — CBG MONITORING, ED
Glucose-Capillary: 192 mg/dL — ABNORMAL HIGH (ref 70–99)
Glucose-Capillary: 230 mg/dL — ABNORMAL HIGH (ref 70–99)
Glucose-Capillary: 229 mg/dL — ABNORMAL HIGH (ref 70–99)

## 2022-02-17 LAB — BRAIN NATRIURETIC PEPTIDE: B Natriuretic Peptide: 180 pg/mL — ABNORMAL HIGH (ref 0.0–100.0)

## 2022-02-17 LAB — TROPONIN I (HIGH SENSITIVITY): Troponin I (High Sensitivity): 8 ng/L (ref <18)

## 2022-02-17 LAB — APTT: aPTT: 23 seconds — ABNORMAL LOW (ref 24–36)

## 2022-02-17 LAB — PROTIME-INR
INR: 1.1 (ref 0.8–1.2)
Prothrombin Time: 13.8 seconds (ref 11.4–15.2)

## 2022-02-17 LAB — RESP PANEL BY RT-PCR (RSV, FLU A&B, COVID)  RVPGX2
Influenza A by PCR: POSITIVE — AB
Influenza B by PCR: NEGATIVE
Resp Syncytial Virus by PCR: NEGATIVE
SARS Coronavirus 2 by RT PCR: NEGATIVE

## 2022-02-17 LAB — LACTIC ACID, PLASMA
Lactic Acid, Venous: 2.6 mmol/L (ref 0.5–1.9)
Lactic Acid, Venous: 3.6 mmol/L (ref 0.5–1.9)

## 2022-02-17 LAB — PROCALCITONIN: Procalcitonin: <0.1 ng/mL

## 2022-02-17 MED ORDER — POLYETHYLENE GLYCOL 3350 17 G PO PACK: 17.0000 g | PACK | Freq: Every day | ORAL | Status: DC | PRN

## 2022-02-17 MED ORDER — ACETAMINOPHEN 325 MG PO TABS: 650.0000 mg | ORAL_TABLET | Freq: Four times a day (QID) | ORAL | Status: DC | PRN

## 2022-02-17 MED ORDER — PREDNISONE 20 MG PO TABS
60.0000 mg | ORAL_TABLET | Freq: Once | ORAL | Status: AC
  Administered 2022-02-17: 60 mg via ORAL
  Filled 2022-02-17: qty 3

## 2022-02-17 MED ORDER — DOXYCYCLINE HYCLATE 100 MG PO TABS: 100.0000 mg | ORAL_TABLET | Freq: Two times a day (BID) | ORAL | Status: DC

## 2022-02-17 MED ORDER — DM-GUAIFENESIN ER 30-600 MG PO TB12: 1.0000 | ORAL_TABLET | Freq: Two times a day (BID) | ORAL | Status: DC | PRN

## 2022-02-17 MED ORDER — INSULIN ASPART 100 UNIT/ML IJ SOLN
0.0000 [IU] | Freq: Three times a day (TID) | INTRAMUSCULAR | Status: DC
  Administered 2022-02-17: 2 [IU] via SUBCUTANEOUS
  Administered 2022-02-17 – 2022-02-18 (×3): 3 [IU] via SUBCUTANEOUS
  Administered 2022-02-18: 2 [IU] via SUBCUTANEOUS
  Administered 2022-02-19: 3 [IU] via SUBCUTANEOUS
  Administered 2022-02-19: 2 [IU] via SUBCUTANEOUS
  Administered 2022-02-19: 5 [IU] via SUBCUTANEOUS
  Administered 2022-02-20 (×2): 3 [IU] via SUBCUTANEOUS
  Administered 2022-02-20 – 2022-02-21 (×2): 2 [IU] via SUBCUTANEOUS
  Administered 2022-02-21: 5 [IU] via SUBCUTANEOUS
  Filled 2022-02-17 (×12): qty 1

## 2022-02-17 MED ORDER — HYDRALAZINE HCL 20 MG/ML IJ SOLN: 5.0000 mg | INTRAMUSCULAR | Status: DC | PRN

## 2022-02-17 MED ORDER — LORAZEPAM 2 MG/ML IJ SOLN: 0.0000 mg | Freq: Two times a day (BID) | INTRAMUSCULAR | Status: AC

## 2022-02-17 MED ORDER — SODIUM CHLORIDE 0.9 % IV BOLUS
500.0000 mL | Freq: Once | INTRAVENOUS | Status: AC
  Administered 2022-02-17: 500 mL via INTRAVENOUS

## 2022-02-17 MED ORDER — MONTELUKAST SODIUM 10 MG PO TABS
10.0000 mg | ORAL_TABLET | Freq: Every day | ORAL | Status: DC
  Administered 2022-02-17 – 2022-02-20 (×4): 10 mg via ORAL
  Filled 2022-02-17 (×4): qty 1

## 2022-02-17 MED ORDER — SODIUM CHLORIDE 0.9 % IV SOLN
1.0000 g | Freq: Once | INTRAVENOUS | Status: AC
  Administered 2022-02-17: 1 g via INTRAVENOUS
  Filled 2022-02-17: qty 10

## 2022-02-17 MED ORDER — METOPROLOL TARTRATE 50 MG PO TABS
50.0000 mg | ORAL_TABLET | Freq: Two times a day (BID) | ORAL | Status: DC
  Administered 2022-02-17 – 2022-02-21 (×8): 50 mg via ORAL
  Filled 2022-02-17 (×3): qty 1
  Filled 2022-02-17: qty 2
  Filled 2022-02-17 (×3): qty 1
  Filled 2022-02-17: qty 2
  Filled 2022-02-17: qty 1

## 2022-02-17 MED ORDER — IPRATROPIUM-ALBUTEROL 0.5-2.5 (3) MG/3ML IN SOLN
9.0000 mL | Freq: Once | RESPIRATORY_TRACT | Status: AC
  Administered 2022-02-17: 9 mL via RESPIRATORY_TRACT
  Filled 2022-02-17: qty 3

## 2022-02-17 MED ORDER — THIAMINE MONONITRATE 100 MG PO TABS
100.0000 mg | ORAL_TABLET | Freq: Every day | ORAL | Status: DC
  Administered 2022-02-17 – 2022-02-21 (×5): 100 mg via ORAL
  Filled 2022-02-17 (×5): qty 1

## 2022-02-17 MED ORDER — INSULIN ASPART 100 UNIT/ML IJ SOLN
0.0000 [IU] | Freq: Every day | INTRAMUSCULAR | Status: DC
  Administered 2022-02-17 – 2022-02-19 (×2): 2 [IU] via SUBCUTANEOUS
  Administered 2022-02-20: 3 [IU] via SUBCUTANEOUS
  Filled 2022-02-17 (×3): qty 1

## 2022-02-17 MED ORDER — LORAZEPAM 1 MG PO TABS
1.0000 mg | ORAL_TABLET | ORAL | Status: AC | PRN
  Administered 2022-02-19 (×2): 1 mg via ORAL
  Filled 2022-02-17 (×2): qty 1

## 2022-02-17 MED ORDER — DOXYCYCLINE HYCLATE 100 MG PO TABS
100.0000 mg | ORAL_TABLET | Freq: Once | ORAL | Status: AC
  Administered 2022-02-17: 100 mg via ORAL
  Filled 2022-02-17: qty 1

## 2022-02-17 MED ORDER — IPRATROPIUM-ALBUTEROL 0.5-2.5 (3) MG/3ML IN SOLN
3.0000 mL | RESPIRATORY_TRACT | Status: DC
  Administered 2022-02-17 – 2022-02-18 (×8): 3 mL via RESPIRATORY_TRACT
  Filled 2022-02-17 (×7): qty 3

## 2022-02-17 MED ORDER — FUROSEMIDE 10 MG/ML IJ SOLN: 40.0000 mg | Freq: Two times a day (BID) | INTRAMUSCULAR | Status: DC

## 2022-02-17 MED ORDER — MAGNESIUM SULFATE 2 GM/50ML IV SOLN
2.0000 g | Freq: Once | INTRAVENOUS | Status: AC
  Administered 2022-02-17: 2 g via INTRAVENOUS
  Filled 2022-02-17: qty 50

## 2022-02-17 MED ORDER — SODIUM CHLORIDE 0.9 % IV SOLN: INTRAVENOUS | Status: DC

## 2022-02-17 MED ORDER — METOPROLOL TARTRATE 5 MG/5ML IV SOLN: 2.5000 mg | INTRAVENOUS | Status: DC | PRN

## 2022-02-17 MED ORDER — NITROGLYCERIN 0.4 MG SL SUBL: 0.4000 mg | SUBLINGUAL_TABLET | SUBLINGUAL | Status: DC | PRN

## 2022-02-17 MED ORDER — SODIUM CHLORIDE 0.9 % IV BOLUS
1000.0000 mL | Freq: Once | INTRAVENOUS | Status: AC
  Administered 2022-02-17: 1000 mL via INTRAVENOUS

## 2022-02-17 MED ORDER — OSELTAMIVIR PHOSPHATE 30 MG PO CAPS
30.0000 mg | ORAL_CAPSULE | Freq: Two times a day (BID) | ORAL | Status: DC
  Administered 2022-02-17 – 2022-02-19 (×4): 30 mg via ORAL
  Filled 2022-02-17 (×6): qty 1

## 2022-02-17 MED ORDER — DIPHENHYDRAMINE HCL 50 MG/ML IJ SOLN: 12.5000 mg | Freq: Three times a day (TID) | INTRAMUSCULAR | Status: DC | PRN

## 2022-02-17 MED ORDER — LORAZEPAM 2 MG/ML IJ SOLN
1.0000 mg | Freq: Once | INTRAMUSCULAR | Status: AC
  Administered 2022-02-17: 1 mg via INTRAVENOUS
  Filled 2022-02-17: qty 1

## 2022-02-17 MED ORDER — ROSUVASTATIN CALCIUM 10 MG PO TABS
40.0000 mg | ORAL_TABLET | Freq: Every day | ORAL | Status: DC
  Administered 2022-02-17 – 2022-02-20 (×4): 40 mg via ORAL
  Filled 2022-02-17 (×2): qty 4
  Filled 2022-02-17 (×2): qty 2
  Filled 2022-02-17: qty 4
  Filled 2022-02-17: qty 2

## 2022-02-17 MED ORDER — ENOXAPARIN SODIUM 40 MG/0.4ML IJ SOSY
40.0000 mg | PREFILLED_SYRINGE | INTRAMUSCULAR | Status: DC
  Administered 2022-02-17 – 2022-02-20 (×4): 40 mg via SUBCUTANEOUS
  Filled 2022-02-17 (×4): qty 0.4

## 2022-02-17 MED ORDER — FOLIC ACID 1 MG PO TABS
1.0000 mg | ORAL_TABLET | Freq: Every day | ORAL | Status: DC
  Administered 2022-02-17 – 2022-02-21 (×5): 1 mg via ORAL
  Filled 2022-02-17 (×5): qty 1

## 2022-02-17 MED ORDER — THIAMINE HCL 100 MG/ML IJ SOLN
100.0000 mg | Freq: Every day | INTRAMUSCULAR | Status: DC
  Filled 2022-02-17: qty 2

## 2022-02-17 MED ORDER — LORAZEPAM 2 MG/ML IJ SOLN: 0.0000 mg | Freq: Four times a day (QID) | INTRAMUSCULAR | Status: AC

## 2022-02-17 MED ORDER — ASPIRIN 81 MG PO TBEC
81.0000 mg | DELAYED_RELEASE_TABLET | Freq: Every evening | ORAL | Status: DC
  Administered 2022-02-17 – 2022-02-20 (×4): 81 mg via ORAL
  Filled 2022-02-17 (×4): qty 1

## 2022-02-17 MED ORDER — ADULT MULTIVITAMIN W/MINERALS CH
1.0000 | ORAL_TABLET | Freq: Every day | ORAL | Status: DC
  Administered 2022-02-17 – 2022-02-21 (×5): 1 via ORAL
  Filled 2022-02-17 (×5): qty 1

## 2022-02-17 MED ORDER — ALBUTEROL SULFATE (2.5 MG/3ML) 0.083% IN NEBU
2.5000 mg | INHALATION_SOLUTION | RESPIRATORY_TRACT | Status: DC | PRN
  Administered 2022-02-19: 2.5 mg via RESPIRATORY_TRACT
  Filled 2022-02-17: qty 3

## 2022-02-17 MED ORDER — LORAZEPAM 2 MG/ML IJ SOLN: 1.0000 mg | INTRAMUSCULAR | Status: AC | PRN

## 2022-02-17 MED ORDER — DOXYCYCLINE HYCLATE 100 MG PO TABS
100.0000 mg | ORAL_TABLET | Freq: Two times a day (BID) | ORAL | Status: DC
  Administered 2022-02-17 – 2022-02-21 (×7): 100 mg via ORAL
  Filled 2022-02-17 (×9): qty 1

## 2022-02-17 MED ORDER — SODIUM CHLORIDE 0.9 % IV SOLN: 1.0000 g | INTRAVENOUS | Status: DC

## 2022-02-17 MED ORDER — METHYLPREDNISOLONE SODIUM SUCC 40 MG IJ SOLR
40.0000 mg | Freq: Two times a day (BID) | INTRAMUSCULAR | Status: DC
  Administered 2022-02-17 – 2022-02-20 (×8): 40 mg via INTRAVENOUS
  Filled 2022-02-17 (×8): qty 1

## 2022-02-17 NOTE — ED Provider Notes (Signed)
[facility] Provider Note    Event Date/Time   First MD Initiated Contact with Patient 02/17/22 [phone]     (approximate)   History   Shortness of Breath   HPI  Bradley [name] is a 71 y.o. male   Past medical history of COPD on 3 to 4 L baseline, diabetes, CAD, alcohol use, tobacco use, hyperlipidemia and hypertension, lung cancer and started on radiation therapy last week presents with cough productive clear sputum, shortness of breath and wheezing for the last 2 to 3 days.  Has been refractory to his home breathing treatments.  He denies fever or chills.  He denies chest pain.  No abdominal pain nausea or vomiting.  No other acute medical complaints.  History was obtained via the patient.  I reviewed a radiation oncology note dated 01/28/2022 which detailed his lung mass and plan for SBRT, deemed too high risk for surgical intervention or biopsy.      Physical Exam   Triage Vital Signs: ED Triage Vitals  Enc Vitals Group     BP 02/17/22 0910 117/74     Pulse Rate 02/17/22 0910 (!) 103     Resp 02/17/22 0910 (!) 30     Temp 02/17/22 0910 98.5 F (36.9 C)     Temp Source 02/17/22 0910 Axillary     SpO2 02/17/22 0910 91 %     Weight 02/17/22 0913 162 lb 11.2 oz (73.8 kg)     Height 02/17/22 0913 5\' 7"  (1.702 m)     Head Circumference --      Peak Flow --      Pain Score 02/17/22 0911 0     Pain Loc --      Pain Edu? --      Excl. in [place]? --     Most recent vital signs: Vitals:   02/17/22 0910  BP: 117/74  Pulse: (!) 103  Resp: (!) 30  Temp: 98.5 F (36.9 C)  SpO2: 91%    General: Awake, no distress.  CV:  Good peripheral perfusion.  Resp:  Normal effort.  Abd:  No distention.  Other:  Comfortable appearing on 4 L nasal cannula oxygen saturations 90% afebrile speaking in full sentences but he does have mild wheezing to the lung fields bilaterally without focality without rales.  He appears euvolemic.  He is tachypneic at 30 and tachycardic  103.   ED Results / Procedures / Treatments   Labs (all labs ordered are listed, but only abnormal results are displayed) Labs Reviewed  LACTIC ACID, PLASMA - Abnormal; Notable for the following components:      Result Value   Lactic Acid, Venous 2.6 (*)    All other components within normal limits  CBC WITH DIFFERENTIAL/PLATELET - Abnormal; Notable for the following components:   RDW 16.2 (*)    Neutro Abs 8.9 (*)    Lymphs Abs 0.5 (*)    All other components within normal limits  APTT - Abnormal; Notable for the following components:   aPTT 23 (*)    All other components within normal limits  CULTURE, BLOOD (ROUTINE X 2)  CULTURE, BLOOD (ROUTINE X 2)  PROTIME-INR  BLOOD GAS, VENOUS  LACTIC ACID, PLASMA  BRAIN NATRIURETIC PEPTIDE  COMPREHENSIVE METABOLIC PANEL  TROPONIN I (HIGH SENSITIVITY)     I reviewed labs and they are notable for VBG with normal pH and pCO2 of 45.  He has a lactic acidosis 2.6 initial troponin is 8.  EKG  ED ECG REPORT I, Lucillie Garfinkel, the attending physician, personally viewed and interpreted this ECG.   Date: 02/17/2022  EKG Time: 0915  Rate: 102  Rhythm: sinus tachycardia, RBBB  Intervals:right bundle branch block and left anterior fascicular block  ST&T Change: He has right bundle branch with associated T wave inversion in V1 to V3 which is same as prior, questionable ST depressions in lead II and III though baseline is wandering.  No STEMI.    RADIOLOGY I independently reviewed and interpreted chest x-ray and see no obvious focal consolidations or pneumothorax   PROCEDURES:  Critical Care performed: Yes, see critical care procedure note(s)  .Critical Care  Performed by: Lucillie Garfinkel, MD Authorized by: Lucillie Garfinkel, MD   Critical care provider statement:    Critical care time (minutes):  30   Critical care was necessary to treat or prevent imminent or life-threatening deterioration of the following conditions:  Respiratory failure  and sepsis   Critical care was time spent personally by me on the following activities:  Development of treatment plan with patient or surrogate, discussions with consultants, evaluation of patient's response to treatment, examination of patient, ordering and review of laboratory studies, ordering and review of radiographic studies, ordering and performing treatments and interventions, pulse oximetry, re-evaluation of patient's condition and review of old charts    MEDICATIONS ORDERED IN ED: Medications  magnesium sulfate IVPB 2 g 50 mL (2 g Intravenous New Bag/Given 02/17/22 0956)  sodium chloride 0.9 % bolus 1,000 mL (has no administration in time range)  cefTRIAXone (ROCEPHIN) 1 g in sodium chloride 0.9 % 100 mL IVPB (has no administration in time range)  sodium chloride 0.9 % bolus 1,000 mL (has no administration in time range)  ipratropium-albuterol (DUONEB) 0.5-2.5 (3) MG/3ML nebulizer solution 9 mL (9 mLs Nebulization Given 02/17/22 0956)  predniSONE (DELTASONE) tablet 60 mg (60 mg Oral Given 02/17/22 0955)  doxycycline (VIBRA-TABS) tablet 100 mg (100 mg Oral Given 02/17/22 0956)    Consultants:  I spoke with hospitalist for admission and regarding care plan for this patient.   IMPRESSION / MDM / ASSESSMENT AND PLAN / ED COURSE  I reviewed the triage vital signs and the nursing notes.                              Differential diagnosis includes, but is not limited to, COPD exacerbation, respiratory infection, ACS, PE, dissection or pneumothorax, sepsis   The patient is on the cardiac monitor to evaluate for evidence of arrhythmia and/or significant heart rate changes.  MDM: Patient with severe COPD with signs and symptoms consistent with COPD exacerbation.  Will give DuoNebs and prednisone and assess for response.  He is on his baseline O2 with normal saturations.    Consider progression of his known lung mass, effects of radiation therapy.  Consider sepsis given infectious  respiratory symptoms and tachycardia and respiratory rate increased -though I think much of these are due to his shortness of breath.  I will administer 1 L of crystalloid and antibiotics to cover respiratory infection given severe COPD exacerbation.  Lactic acidosis 2.7, added 1 L to complete 30 cc/kg sepsis bolus per ideal body weight.   Doubt ACS, doubt PE, doubt dissection.  I reevaluated the patient at this time after nebulizer treatment and IV crystalloids, continues to be tachycardic in the low 100s and normotensive though he states that his breathing is somewhat improved.  Plan  for admission.  Patient's presentation is most consistent with acute presentation with potential threat to life or bodily function.       FINAL CLINICAL IMPRESSION(S) / ED DIAGNOSES   Final diagnoses:  COPD exacerbation ([place])     Rx / DC Orders   ED Discharge Orders     None        Note:  This document was prepared using Dragon voice recognition software and may include unintentional dictation errors.    Lucillie Garfinkel, MD 02/17/22 [phone]

## 2022-02-17 NOTE — H&P (Signed)
History and Physical    Bradley [name] TOI:[id number] DOB: Jun 30, 1950 DOA: 02/17/2022  Referring MD/NP/PA:   PCP: Valera Castle, MD   Patient coming from:  The patient is coming from independent living facility     Chief Complaint: Shortness of breath  HPI: [name] is a 71 y.o. male with medical history significant of hypertension, hyperlipidemia, diabetes mellitus, COPD, on 3L of oxygen at home, depression with anxiety, CKD-3A, PVD, former smoker, alcohol abuse, CAD, NSCLC on XRT, who presents with shortness breath.  Patient has cough, shortness of breath, wheezing for more than 3 days, which has been progressively worsening.  Patient coughs up clear mucus.  No chest pain, fever or chills.  No sore throat. He also has nausea, vomiting and diarrhea.  He has multiple episodes of nonbilious nonbloody vomiting each day.  He has several times of watery diarrhea each day.  His diarrhea is better today.  No abdominal pain.  No symptoms of UTI.  Patient is using 3 L oxygen at home, and was found to have oxygen desaturation to 86% on 3L of O2, cannot speak in full sentence, started on 4 L oxygen with 91% saturation, still has severe respiratory distress.  Ordered BiPAP.  Data reviewed independently and ED Course: pt was found to have positive flu A PCR, WBC 10.3, lactic acid 2.6, INR 1.1, PTT 23, troponin level 8, BNP 180, slightly worse renal function, temperature normal, blood pressure 117/74, heart rate of 100-1 20, RR 30, chest x-ray showed severe COPD.  VBG with pH 7.35, CO2 45.  Patient is admitted to PCU as inpatient.  EKG: I have personally reviewed.  Sinus rhythm, QTc 502, bifascicular block, poor R wave progression   Review of Systems:   General: no fevers, chills, no body weight gain, has fatigue HEENT: no blurry vision, hearing changes or sore throat Respiratory: has dyspnea, coughing, wheezing CV: no chest pain, no palpitations GI: has nausea, vomiting, diarrhea, no  constipation, abdominal pain GU: no dysuria, burning on urination, increased urinary frequency, hematuria  Ext: has trace leg edema Neuro: no unilateral weakness, numbness, or tingling, no vision change or hearing loss Skin: no rash, no skin tear. MSK: No muscle spasm, no deformity, no limitation of range of movement in spin Heme: No easy bruising.  Travel history: No recent long distant travel.   Allergy:  Allergies  Allergen Reactions   Ace Inhibitors Other (See Comments)    Hypotension    Past Medical History:  Diagnosis Date   Alcohol use disorder    Anemia    Anxiety    COPD (chronic obstructive pulmonary disease) (HCC)    Coronary artery disease    Depression    Diabetes mellitus without complication ([place])    Family history of adverse reaction to anesthesia    daughter-spinal headache afer epidural   History of tobacco abuse    Hyperlipidemia    Hypertension    Hyponatremia    Left carotid artery stenosis    Lung mass    Malnutrition (HCC)    Nausea vomiting and diarrhea 02/17/2022   PAD (peripheral artery disease) (HCC)    Pneumonia    Sleep apnea    does not use cpap   Spinal headache    after lung surgery   Stage 2 chronic kidney disease    Tachycardia     Past Surgical History:  Procedure Laterality Date   CAROTID STENT Left    LEFT HEART CATH AND CORONARY ANGIOGRAPHY  Left 11/02/2019   Procedure: LEFT HEART CATH AND CORONARY ANGIOGRAPHY;  Surgeon: Teodoro Spray, MD;  Location: [facility];  Service: Cardiovascular;  Laterality: Left;   LUNG BIOPSY Right    2006   right side partial lobe removed   1990    Social History:  reports that he quit smoking about 6 years ago. His smoking use included cigarettes. He has a 52.00 pack-year smoking history. He has never used smokeless tobacco. He reports that he does not currently use alcohol after a past usage of about 3.0 standard drinks of alcohol per week. He reports that he does not use  drugs.  Family History:  Family History  Problem Relation Age of Onset   Cancer Mother    Heart attack Father    Cancer Sister      Prior to Admission medications   Medication Sig Start Date End Date Taking? Authorizing Provider  albuterol (VENTOLIN HFA) 108 (90 Base) MCG/ACT inhaler 2 puffs every 6 (six) hours as needed. 06/25/21   [provider]  aspirin EC 81 MG tablet Take 81 mg by mouth every evening.     [provider]  brimonidine (ALPHAGAN) 0.2 % ophthalmic solution Place 1 drop into both eyes 2 (two) times daily.    [provider]  Fluticasone-Salmeterol (ADVAIR) 250-50 MCG/DOSE AEPB Inhale 1 puff into the lungs 2 (two) times daily. 09/20/15   Hillary Bow, MD  furosemide (LASIX) 20 MG tablet Take 20 mg by mouth as needed for edema.    [provider]  ipratropium-albuterol (DUONEB) 0.5-2.5 (3) MG/3ML SOLN Take 3 mLs by nebulization every 4 (four) hours as needed. 03/06/21   Hosie Poisson, MD  levalbuterol (XOPENEX) 0.31 MG/3ML nebulizer solution Take 1 ampule by nebulization every 4 (four) hours as needed for wheezing.    [provider]  linagliptin (TRADJENTA) 5 MG TABS tablet Take 5 mg by mouth every morning.    [provider]  metFORMIN (GLUCOPHAGE-XR) 750 MG 24 hr tablet Take 750 mg by mouth 2 (two) times daily.    [provider]  metoprolol tartrate (LOPRESSOR) 50 MG tablet Take 50 mg by mouth 2 (two) times daily. 11/21/19   [provider]  montelukast (SINGULAIR) 10 MG tablet Take 10 mg by mouth at bedtime. 01/09/21   [provider]  nitroGLYCERIN (NITROSTAT) 0.4 MG SL tablet Place 0.4 mg under the tongue every 5 (five) minutes x 3 doses as needed for chest pain.     [provider]  OXYGEN Inhale 2.5-3 L into the lungs continuous.    [provider]  polyethylene glycol (MIRALAX / GLYCOLAX) 17 g packet Take 17 g by mouth daily as needed.    [provider]   rosuvastatin (CRESTOR) 40 MG tablet Take 40 mg by mouth every morning. 08/05/19   [provider]  sildenafil (REVATIO) 20 MG tablet Take 20-100 mg by mouth as needed.    [provider]  tiotropium (SPIRIVA) 18 MCG inhalation capsule Place 18 mcg into inhaler and inhale every morning.    [provider]    Physical Exam: Vitals:   02/17/22 1630 02/17/22 1700 02/17/22 1730 02/17/22 1831  BP: (!) 127/97 120/66 116/70   Pulse: 100 98 96   Resp: (!) 28 (!) 28 (!) 31   Temp:      TempSrc:      SpO2: 93% 94% (!) 87% 96%  Weight:      Height:  General: Not in acute distress HEENT:       Eyes: PERRL, EOMI, no scleral icterus.       ENT: No discharge from the ears and nose, no pharynx injection, no tonsillar enlargement.        Neck: No JVD, no bruit, no mass felt. Heme: No neck lymph node enlargement. Cardiac: S1/S2, RRR, No murmurs, No gallops or rubs. Respiratory: Has wheezing bilaterally GI: Soft, nondistended, nontender, no rebound pain, no organomegaly, BS present. GU: No hematuria Ext: Has trace leg edema bilaterally. 1+DP/PT pulse bilaterally. Musculoskeletal: No joint deformities, No joint redness or warmth, no limitation of ROM in spin. Skin: No rashes.  Neuro: Alert, oriented X3, cranial nerves II-XII grossly intact, moves all extremities normally.  Psych: Patient is not psychotic, no suicidal or hemocidal ideation.  Labs on Admission: I have personally reviewed following labs and imaging studies  CBC: Recent Labs  Lab 02/17/22 0948  WBC 10.3  NEUTROABS 8.9*  HGB 16.2  HCT 51.4  MCV 91.6  PLT 476   Basic Metabolic Panel: Recent Labs  Lab 02/17/22 1114  NA 138  K 4.1  CL 105  CO2 19*  GLUCOSE 227*  BUN 17  CREATININE 1.30*  CALCIUM 8.4*   GFR: Estimated Creatinine Clearance: 48 mL/min (A) (by C-G formula based on SCr of 1.3 mg/dL (H)). Liver Function Tests: Recent Labs  Lab 02/17/22 1114  AST 26  ALT 19  ALKPHOS 62   BILITOT 1.0  PROT 7.1  ALBUMIN 3.5   No results for input(s): "LIPASE", "AMYLASE" in the last 168 hours. No results for input(s): "AMMONIA" in the last 168 hours. Coagulation Profile: Recent Labs  Lab 02/17/22 0948  INR 1.1   Cardiac Enzymes: No results for input(s): "CKTOTAL", "CKMB", "CKMBINDEX", "TROPONINI" in the last 168 hours. BNP (last 3 results) No results for input(s): "PROBNP" in the last 8760 hours. HbA1C: No results for input(s): "HGBA1C" in the last 72 hours. CBG: Recent Labs  Lab 02/17/22 1222 02/17/22 1658  GLUCAP 192* 230*   Lipid Profile: No results for input(s): "CHOL", "HDL", "LDLCALC", "TRIG", "CHOLHDL", "LDLDIRECT" in the last 72 hours. Thyroid Function Tests: No results for input(s): "TSH", "T4TOTAL", "FREET4", "T3FREE", "THYROIDAB" in the last 72 hours. Anemia Panel: No results for input(s): "VITAMINB12", "FOLATE", "FERRITIN", "TIBC", "IRON", "RETICCTPCT" in the last 72 hours. Urine analysis:    Component Value Date/Time   COLORURINE YELLOW (A) 03/01/2021 1938   APPEARANCEUR CLOUDY (A) 03/01/2021 1938   LABSPEC 1.016 03/01/2021 1938   PHURINE 5.0 03/01/2021 1938   GLUCOSEU >=500 (A) 03/01/2021 1938   HGBUR SMALL (A) 03/01/2021 1938   BILIRUBINUR NEGATIVE 03/01/2021 1938   KETONESUR NEGATIVE 03/01/2021 1938   PROTEINUR 100 (A) 03/01/2021 1938   NITRITE NEGATIVE 03/01/2021 1938   LEUKOCYTESUR NEGATIVE 03/01/2021 1938   Sepsis Labs: @LABRCNTIP (procalcitonin:4,lacticidven:4) ) Recent Results (from the past 240 hour(s))  Resp panel by RT-PCR (RSV, Flu A&B, Covid) Anterior Nasal Swab     Status: Abnormal   Collection Time: 02/17/22 11:14 AM   Specimen: Anterior Nasal Swab  Result Value Ref Range Status   SARS Coronavirus 2 by RT PCR NEGATIVE NEGATIVE Final    Comment: (NOTE) SARS-CoV-2 target nucleic acids are NOT DETECTED.  The SARS-CoV-2 RNA is generally detectable in upper respiratory specimens during the acute phase of infection. The  lowest concentration of SARS-CoV-2 viral copies this assay can detect is 138 copies/mL. A negative result does not preclude SARS-Cov-2 infection and should not be used as the  sole basis for treatment or other patient management decisions. A negative result may occur with  improper specimen collection/handling, submission of specimen other than nasopharyngeal swab, presence of viral mutation(s) within the areas targeted by this assay, and inadequate number of viral copies(<138 copies/mL). A negative result must be combined with clinical observations, patient history, and epidemiological information. The expected result is Negative.  Fact Sheet for Patients:  EntrepreneurPulse.com.au  Fact Sheet for Healthcare Providers:  IncredibleEmployment.be  This test is no t yet approved or cleared by the [place] FDA and  has been authorized for detection and/or diagnosis of SARS-CoV-2 by FDA under an Emergency Use Authorization (EUA). This EUA will remain  in effect (meaning this test can be used) for the duration of the COVID-19 declaration under Section 564(b)(1) of the Act, 21 U.S.C.section 360bbb-3(b)(1), unless the authorization is terminated  or revoked sooner.       Influenza A by PCR POSITIVE (A) NEGATIVE Final   Influenza B by PCR NEGATIVE NEGATIVE Final    Comment: (NOTE) The Xpert Xpress SARS-CoV-2/FLU/RSV plus assay is intended as an aid in the diagnosis of influenza from Nasopharyngeal swab specimens and should not be used as a sole basis for treatment. Nasal washings and aspirates are unacceptable for Xpert Xpress SARS-CoV-2/FLU/RSV testing.  Fact Sheet for Patients: EntrepreneurPulse.com.au  Fact Sheet for Healthcare Providers: IncredibleEmployment.be  This test is not yet approved or cleared by the [place] FDA and has been authorized for detection and/or diagnosis of SARS-CoV-2 by FDA  under an Emergency Use Authorization (EUA). This EUA will remain in effect (meaning this test can be used) for the duration of the COVID-19 declaration under Section 564(b)(1) of the Act, 21 U.S.C. section 360bbb-3(b)(1), unless the authorization is terminated or revoked.     Resp Syncytial Virus by PCR NEGATIVE NEGATIVE Final    Comment: (NOTE) Fact Sheet for Patients: EntrepreneurPulse.com.au  Fact Sheet for Healthcare Providers: IncredibleEmployment.be  This test is not yet approved or cleared by the [place] FDA and has been authorized for detection and/or diagnosis of SARS-CoV-2 by FDA under an Emergency Use Authorization (EUA). This EUA will remain in effect (meaning this test can be used) for the duration of the COVID-19 declaration under Section 564(b)(1) of the Act, 21 U.S.C. section 360bbb-3(b)(1), unless the authorization is terminated or revoked.  Performed at [facility], [address], [place], [place] [address]      Radiological Exams on Admission: DG Chest [facility] 1 View  Result Date: 02/17/2022 CLINICAL DATA:  Shortness of breath for 2 days.  Sepsis.  COPD. EXAM: PORTABLE CHEST 1 VIEW COMPARISON:  07/09/2021 FINDINGS: The heart size and mediastinal contours are within normal limits. Severe emphysema again noted. Stable pleural-parenchymal scarring in the right lung base. No evidence of superimposed pulmonary infiltrate or pleural effusion. IMPRESSION: Severe emphysema and right basilar scarring. No acute findings. Electronically Signed   By: Marlaine Hind M.D.   On: 02/17/2022 09:33      Assessment/Plan Principal Problem:   Influenza A Active Problems:   Acute on chronic respiratory failure with hypoxia (HCC)   COPD exacerbation (HCC)   Severe sepsis (HCC)   Essential hypertension   HLD (hyperlipidemia)   Type II diabetes mellitus with renal manifestations (HCC)   Chronic kidney disease, stage 3a (HCC)    CAD (coronary artery disease)   Non-small cell lung cancer (NSCLC) (HCC)   Nausea vomiting and diarrhea   Alcohol abuse   Assessment and Plan:  Acute on  chronic respiratory failure with hypoxia and sepsis due to influenza A and flu A- triggered COPD exacerbation: pt meets criteria for severe sepsis with heart rate of 103, RR 30, lactic acid 2.6.  -will admit to PCU as inpatient -BiPAP -Tamiflu -Doxycycline 100 mg twice daily empirically (patient received 1 dose of Rocephin in ED) -Bronchodilators -Patient received 2 g of magnesium sulfate in ED -Solu-Medrol 40 mg IV bid -Mucinex for cough  -Incentive spirometry -sputum culture -Nasal cannula oxygen as needed to maintain O2 saturation 93% or greater when pt is off BiPAP -will get Procalcitonin and trend lactic acid levels per sepsis protocol. -IVF: 2.5L of NS bolus in ED, followed by 75 cc/h  -f/u CTA to r/o PE  Essential hypertension -IV hydralazine as needed -Metoprolol  HLD (hyperlipidemia) -Cresto  Type II diabetes mellitus with renal manifestations ([place]): A1c 8.3, poorly controlled.  Patient taking metformin and Tradjenta -SSI  Chronic kidney disease, stage 3a ([place]): Slightly worsening than baseline.  Baseline creatinine 1.17 on 07/15/2021.  His creatinine is 1.30, BUN 17, GFR 58 -f/u by BMP  CAD (coronary artery disease) -ASA and Crestor  Non-small cell lung cancer (NSCLC) ([place]) -Patient is still receiving radiation therapy, first dose was on Thursday. -Follow-up with oncology  Nausea vomiting and diarrhea -Follow-up C. difficile and GI pathogen panel  Alcohol abuse -CIWA protocol    DVT ppx:  SQ Lovenox  Code Status: Full code  Family Communication:    Yes, patient's daughter at bed side.    Disposition Plan:  Anticipate discharge back to previous environment  Consults called:  none  Admission status and Level of care: Progressive:   as inpt       Dispo: The patient is from: Home               Anticipated d/c is to: Home              Anticipated d/c date is: 2 days              Patient currently is not medically stable to d/c.    Severity of Illness:  The appropriate patient status for this patient is INPATIENT. Inpatient status is judged to be reasonable and necessary in order to provide the required intensity of service to ensure the patient's safety. The patient's presenting symptoms, physical exam findings, and initial radiographic and laboratory data in the context of their chronic comorbidities is felt to place them at high risk for further clinical deterioration. Furthermore, it is not anticipated that the patient will be medically stable for discharge from the hospital within 2 midnights of admission.   * I certify that at the point of admission it is my clinical judgment that the patient will require inpatient hospital care spanning beyond 2 midnights from the point of admission due to high intensity of service, high risk for further deterioration and high frequency of surveillance required.*       Date of Service 02/17/2022    Ivor Costa [facility]   If 7PM-7AM, please contact night-coverage [email or web address] 02/17/2022, 6:42 PM

## 2022-02-17 NOTE — ED Notes (Signed)
MD aware pt unable to tolerate CTA at this time.

## 2022-02-17 NOTE — ED Triage Notes (Signed)
Pt arrived via EMS for shortness of breath x 2 days. Hx of COPD wearing 3L [place] at home. 86-88% on arrival 3L [place]. Pt alert and oriented in NAD.

## 2022-02-17 NOTE — ED Notes (Signed)
EKG completed and given to Dr. Jacelyn Grip. Pt on cardiac monitor.

## 2022-02-17 NOTE — ED Notes (Signed)
MD Blaine Hamper advised of continued HR 120's after bolus. Meds given per MAR.

## 2022-02-18 ENCOUNTER — Inpatient Hospital Stay: Payer: Medicare HMO

## 2022-02-18 ENCOUNTER — Other Ambulatory Visit: Payer: Self-pay

## 2022-02-18 ENCOUNTER — Encounter: Payer: Self-pay | Admitting: Internal Medicine

## 2022-02-18 ENCOUNTER — Ambulatory Visit: Payer: Medicare HMO

## 2022-02-18 DIAGNOSIS — J101 Influenza due to other identified influenza virus with other respiratory manifestations: Secondary | ICD-10-CM

## 2022-02-18 LAB — CBC
HCT: 41.9 % (ref 39.0–52.0)
Hemoglobin: 13.3 g/dL (ref 13.0–17.0)
MCH: 28.4 pg (ref 26.0–34.0)
MCHC: 31.7 g/dL (ref 30.0–36.0)
MCV: 89.3 fL (ref 80.0–100.0)
Platelets: 180 10*3/uL (ref 150–400)
RBC: 4.69 MIL/uL (ref 4.22–5.81)
RDW: 16.3 % — ABNORMAL HIGH (ref 11.5–15.5)
WBC: 9.6 10*3/uL (ref 4.0–10.5)
nRBC: 0 % (ref 0.0–0.2)

## 2022-02-18 LAB — BASIC METABOLIC PANEL
Anion gap: 5 (ref 5–15)
BUN: 13 mg/dL (ref 8–23)
CO2: 22 mmol/L (ref 22–32)
Calcium: 8.5 mg/dL — ABNORMAL LOW (ref 8.9–10.3)
Chloride: 110 mmol/L (ref 98–111)
Creatinine, Ser: 1.04 mg/dL (ref 0.61–1.24)
GFR, Estimated: >60 mL/min (ref >60)
Glucose, Bld: 205 mg/dL — ABNORMAL HIGH (ref 70–99)
Potassium: 4 mmol/L (ref 3.5–5.1)
Sodium: 137 mmol/L (ref 135–145)

## 2022-02-18 LAB — CBG MONITORING, ED
Glucose-Capillary: 201 mg/dL — ABNORMAL HIGH (ref 70–99)
Glucose-Capillary: 231 mg/dL — ABNORMAL HIGH (ref 70–99)
Glucose-Capillary: 181 mg/dL — ABNORMAL HIGH (ref 70–99)

## 2022-02-18 LAB — GLUCOSE, CAPILLARY: Glucose-Capillary: 191 mg/dL — ABNORMAL HIGH (ref 70–99)

## 2022-02-18 MED ORDER — BUDESONIDE 0.25 MG/2ML IN SUSP
0.2500 mg | Freq: Two times a day (BID) | RESPIRATORY_TRACT | Status: DC
  Administered 2022-02-18 – 2022-02-21 (×7): 0.25 mg via RESPIRATORY_TRACT
  Filled 2022-02-18 (×7): qty 2

## 2022-02-18 MED ORDER — ORAL CARE MOUTH RINSE: 15.0000 mL | OROMUCOSAL | Status: DC | PRN

## 2022-02-18 MED ORDER — IOHEXOL 350 MG/ML SOLN
75.0000 mL | Freq: Once | INTRAVENOUS | Status: AC | PRN
  Administered 2022-02-18: 75 mL via INTRAVENOUS

## 2022-02-18 NOTE — Inpatient Diabetes Management (Signed)
Inpatient Diabetes Program Recommendations  AACE/ADA: New Consensus Statement on Inpatient Glycemic Control (2015)  Target Ranges:  Prepandial:   less than 140 mg/dL      Peak postprandial:   less than 180 mg/dL (1-2 hours)      Critically ill patients:  140 - 180 mg/dL   Lab Results  Component Value Date   GLUCAP 231 (H) 02/18/2022   HGBA1C 8.3 (H) 02/06/2021    Review of Glycemic Control  Latest Reference Range & Units 02/17/22 22:12 02/18/22 08:00 02/18/22 12:03  Glucose-Capillary 70 - 99 mg/dL 229 (H) 201 (H) 231 (H)  (H): Data is abnormally high Diabetes history: Type 2 Dm Outpatient Diabetes medications: Tradjenta 5 mg QD, Metformin 750 mg BID Current orders for Inpatient glycemic control: Novolog 0-9 units TID & HS Solumedrol 40 mg BID  Inpatient Diabetes Program Recommendations:    Consider adding Novolog 3 units TID (assuming patient is consuming >50% of meals).  Thanks, Bronson Curb, MSN, RNC-OB Diabetes Coordinator [phone] (8a-5p)

## 2022-02-18 NOTE — Hospital Course (Signed)
71 y.o. male with medical history significant of hypertension, hyperlipidemia, diabetes mellitus, COPD, on 3L of oxygen at home, depression with anxiety, CKD-3A, PVD, former smoker, alcohol abuse, CAD, NSCLC on XRT, who presents with shortness breath.   Patient has cough, shortness of breath, wheezing for more than 3 days, which has been progressively worsening.  Patient coughs up clear mucus.  No chest pain, fever or chills.  No sore throat. He also has nausea, vomiting and diarrhea.  He has multiple episodes of nonbilious nonbloody vomiting each day.  He has several times of watery diarrhea each day.  His diarrhea is better today.  No abdominal pain.  No symptoms of UTI.   Patient is using 3 L oxygen at home, and was found to have oxygen desaturation to 86% on 3L of O2, cannot speak in full sentence, started on 4 L oxygen with 91% saturation, still has severe respiratory distress.  Ordered BiPAP.

## 2022-02-18 NOTE — ED Notes (Signed)
Pt transitioned from Bipap to 4L n/c by RT at this time, the pt is tolerating the transition well, close monitoring continued.

## 2022-02-18 NOTE — Progress Notes (Signed)
Progress Note   Patient: Bradley [name]:[id number] DOB: [birth year] DOA: 02/17/2022     1 DOS: the patient was seen and examined on 02/18/2022   Brief hospital course: 71 y.o. male with medical history significant of hypertension, hyperlipidemia, diabetes mellitus, COPD, on 3L of oxygen at home, depression with anxiety, CKD-3A, PVD, former smoker, alcohol abuse, CAD, NSCLC on XRT, who presents with shortness breath.   Patient has cough, shortness of breath, wheezing for more than 3 days, which has been progressively worsening.  Patient coughs up clear mucus.  No chest pain, fever or chills.  No sore throat. He also has nausea, vomiting and diarrhea.  He has multiple episodes of nonbilious nonbloody vomiting each day.  He has several times of watery diarrhea each day.  His diarrhea is better today.  No abdominal pain.  No symptoms of UTI.   Patient is using 3 L oxygen at home, and was found to have oxygen desaturation to 86% on 3L of O2, cannot speak in full sentence, started on 4 L oxygen with 91% saturation, still has severe respiratory distress.  Ordered BiPAP.  Assessment and Plan: Acute on chronic respiratory failure with hypoxia and sepsis due to influenza A and flu A- triggered COPD exacerbation: pt meets criteria for severe sepsis with heart rate of 103, RR 30, lactic acid 2.6.   -Initially required BiPAP and has now weaned to 5 L of oxygen.  Patient reports he feels "70% better" -Continue Tamiflu -Continue doxycycline 100 mg twice daily empirically (patient received 1 dose of Rocephin in ED) patient that has underlying COPD -Procalcitonin less than 0.1 -Continue bronchodilators and IV Solu-Medrol, Mucinex -Incentive spirometry -sputum culture ordered -CTA without PE although there was evidence of stable pulmonary nodules which likely need to be followed up after discharge -Initial lactic acid 3.6 likely from hypoperfusion in context of hypoxemia-repeat lactic acid down to 2.6    Essential hypertension -IV hydralazine as needed -Metoprolol p.o. as per home dose   HLD (hyperlipidemia) -Crestor   Type II diabetes mellitus with renal manifestations ([place]): A1c 8.3, poorly controlled.  Patient taking metformin and Tradjenta -SSI   Chronic kidney disease, stage 3a ([place]): Slightly worsening than baseline.  Baseline creatinine 1.17 on 07/15/2021.  His creatinine is 1.30, BUN 17, GFR 58 -f/u by BMP   CAD (coronary artery disease) -ASA and Crestor   Non-small cell lung cancer (NSCLC) ([place]) -Patient is still receiving radiation therapy, first dose was on Thursday. -Follow-up with oncology   Nausea vomiting and diarrhea -Follow-up C. difficile and GI pathogen panel   Alcohol abuse -CIWA protocol       Subjective: Reports feels better than at time of presentation but not back to baseline  Physical Exam: Vitals:   02/18/22 0556 02/18/22 0746 02/18/22 1037 02/18/22 1427  BP: 118/65 118/71 101/60 109/70  Pulse: 90 92 (!) 101 88  Resp: (!) 24 (!) 22  (!) 23  Temp: 97.8 F (36.6 C) 97.9 F (36.6 C)  97.9 F (36.6 C)  TempSrc: Oral Oral  Oral  SpO2: 92% 90%  99%  Weight:      Height:       Constitutional: NAD, calm, comfortable Respiratory: Auscultation without wheezing but lung sounds are diminished.  5 L oxygen Cardiovascular: Regular rate and rhythm, no murmurs / rubs / gallops. No extremity edema. 2+ pedal pulses.  Abdomen: no tenderness, no masses palpated. Bowel sounds positive.  Musculoskeletal: no clubbing / cyanosis. No joint deformity upper and lower extremities.  Good ROM, no contractures. Normal muscle tone.  Skin: no rashes, lesions, ulcers. No induration Neurologic: CN 2-12 grossly intact. Sensation intact, DTR normal. Strength 5/5 x all 4 extremities.  Psychiatric: Normal judgment and insight. Alert and oriented x 3. Normal mood.   Data Reviewed:  See documentation and problem list  Family Communication: Patient  only  Disposition: Status is: Inpatient Remains inpatient appropriate because: Continues to require higher than normal oxygen and has acute influenza in the context of COPD and underlying lung cancer  Planned Discharge Destination: Home with Home Health    Time spent: 45 minutes  Author: Erin Hearing, NP 02/18/2022 6:22 PM  For on call review [email or web address].

## 2022-02-19 DIAGNOSIS — J101 Influenza due to other identified influenza virus with other respiratory manifestations: Secondary | ICD-10-CM | POA: Diagnosis not present

## 2022-02-19 LAB — GASTROINTESTINAL PANEL BY PCR, STOOL (REPLACES STOOL CULTURE)

## 2022-02-19 LAB — GLUCOSE, CAPILLARY
Glucose-Capillary: 219 mg/dL — ABNORMAL HIGH (ref 70–99)
Glucose-Capillary: 203 mg/dL — ABNORMAL HIGH (ref 70–99)
Glucose-Capillary: 254 mg/dL — ABNORMAL HIGH (ref 70–99)
Glucose-Capillary: 247 mg/dL — ABNORMAL HIGH (ref 70–99)

## 2022-02-19 LAB — EXPECTORATED SPUTUM ASSESSMENT W GRAM STAIN, RFLX TO RESP C

## 2022-02-19 MED ORDER — ONDANSETRON HCL 4 MG/2ML IJ SOLN: 4.0000 mg | Freq: Four times a day (QID) | INTRAMUSCULAR | Status: DC | PRN

## 2022-02-19 MED ORDER — TRAZODONE HCL 50 MG PO TABS: 50.0000 mg | ORAL_TABLET | Freq: Every evening | ORAL | Status: DC | PRN

## 2022-02-19 MED ORDER — DM-GUAIFENESIN ER 30-600 MG PO TB12
1.0000 | ORAL_TABLET | Freq: Two times a day (BID) | ORAL | Status: DC
  Administered 2022-02-19 – 2022-02-21 (×5): 1 via ORAL
  Filled 2022-02-19 (×6): qty 1

## 2022-02-19 MED ORDER — IPRATROPIUM-ALBUTEROL 0.5-2.5 (3) MG/3ML IN SOLN: 3.0000 mL | RESPIRATORY_TRACT | Status: DC | PRN

## 2022-02-19 MED ORDER — OSELTAMIVIR PHOSPHATE 75 MG PO CAPS
75.0000 mg | ORAL_CAPSULE | Freq: Two times a day (BID) | ORAL | Status: DC
  Administered 2022-02-19 – 2022-02-21 (×4): 75 mg via ORAL
  Filled 2022-02-19 (×4): qty 1

## 2022-02-19 MED ORDER — SENNOSIDES-DOCUSATE SODIUM 8.6-50 MG PO TABS: 1.0000 | ORAL_TABLET | Freq: Every evening | ORAL | Status: DC | PRN

## 2022-02-19 MED ORDER — METOPROLOL TARTRATE 5 MG/5ML IV SOLN: 5.0000 mg | INTRAVENOUS | Status: DC | PRN

## 2022-02-19 MED ORDER — IPRATROPIUM-ALBUTEROL 0.5-2.5 (3) MG/3ML IN SOLN
3.0000 mL | Freq: Four times a day (QID) | RESPIRATORY_TRACT | Status: DC
  Administered 2022-02-19 – 2022-02-21 (×10): 3 mL via RESPIRATORY_TRACT
  Filled 2022-02-19 (×10): qty 3

## 2022-02-19 MED ORDER — SALINE SPRAY 0.65 % NA SOLN
1.0000 | NASAL | Status: DC | PRN
  Administered 2022-02-19: 1 via NASAL
  Filled 2022-02-19: qty 44

## 2022-02-19 MED ORDER — HYDRALAZINE HCL 20 MG/ML IJ SOLN: 10.0000 mg | INTRAMUSCULAR | Status: DC | PRN

## 2022-02-19 MED ORDER — GUAIFENESIN 100 MG/5ML PO LIQD: 5.0000 mL | ORAL | Status: DC | PRN

## 2022-02-19 NOTE — Plan of Care (Signed)

## 2022-02-19 NOTE — Progress Notes (Signed)
PROGRESS NOTE    Bradley [name]  HYQ:[id number] DOB: [birth year] DOA: 02/17/2022 PCP: Valera Castle, MD   Brief Narrative:  71 y.o. male with medical history significant of hypertension, hyperlipidemia, diabetes mellitus, COPD, on 3L of oxygen at home, depression with anxiety, CKD-3A, PVD, former smoker, alcohol abuse, CAD, NSCLC on XRT, who presents with shortness breath.  Patient was diagnosed with influenza A positive likely triggering his COPD exacerbation.   Assessment & Plan:  Principal Problem:   Influenza A Active Problems:   Acute on chronic respiratory failure with hypoxia (HCC)   COPD exacerbation (HCC)   Severe sepsis (HCC)   Essential hypertension   HLD (hyperlipidemia)   Type II diabetes mellitus with renal manifestations (HCC)   Chronic kidney disease, stage 3a (HCC)   CAD (coronary artery disease)   Non-small cell lung cancer (NSCLC) (HCC)   Nausea vomiting and diarrhea   Alcohol abuse    Acute on chronic respiratory failure with hypoxia  Severe sepsis due to influenza A and flu A- triggered COPD exacerbation: pt meets criteria for severe sepsis with heart rate of 103, RR 30, lactic acid 2.6. -CTA chest shows evidence of multiple pulmonary nodules, does have a history of underlying malignancy ongoing outpatient radiation.  This can be followed up outpatient.  For now he will be on Tamiflu to complete 5-day course.  On empiric doxycycline twice daily, X 5 days.  Procalcitonin is negative.  On scheduled and as needed bronchodilators, steroids, I-S/flutter valve.  Supportive care.    Essential hypertension -IV as needed -Metoprolol p.o. as per home dose   HLD (hyperlipidemia) -Crestor   Type II diabetes mellitus with renal manifestations ([place]): A1c 8.3, poorly controlled.  Patient taking metformin and Tradjenta, on hold Sliding scale and Accu-Cheks   Chronic kidney disease, stage 3a ([place]): Creatinine around baseline of 1.04.  Labs from today are pending    CAD (coronary artery disease) -ASA and Crestor   Non-small cell lung cancer (NSCLC) ([place]) -Patient is still receiving radiation therapy, first dose was on Thursday. -Follow-up with oncology   Nausea vomiting and diarrhea -Follow-up C. difficile and GI pathogen panel, collection is pending   Alcohol abuse -CIWA protocol        DVT prophylaxis: Lovenox Code Status: Full code Family Communication:    Status is: Inpatient Remains inpatient appropriate because: Still DOE with just walking across the room.    Nutritional status   Subjective: Tells me at rest he feels better but even with little mobility he gets short of breath.   Examination:  General exam: Appears calm and comfortable, 4 L nasal cannula Respiratory system: Clear to auscultation. Respiratory effort normal. Cardiovascular system: S1 & S2 heard, RRR. No JVD, murmurs, rubs, gallops or clicks. No pedal edema. Gastrointestinal system: Abdomen is nondistended, soft and nontender. No organomegaly or masses felt. Normal bowel sounds heard. Central nervous system: Alert and oriented. No focal neurological deficits. Extremities: Symmetric 5 x 5 power. Skin: No rashes, lesions or ulcers Psychiatry: Judgement and insight appear normal. Mood & affect appropriate.     Objective: Vitals:   02/18/22 2348 02/19/22 0306 02/19/22 0355 02/19/22 0824  BP: 127/74  133/75 112/62  Pulse: 86  85 86  Resp: 16  (!) 22 20  Temp: 98.4 F (36.9 C)  98.6 F (37 C) (!) 97.5 F (36.4 C)  TempSrc: Oral  Oral Oral  SpO2: 94% 91% 95% 90%  Weight:      Height:  Intake/Output Summary (Last 24 hours) at 02/19/2022 0925 Last data filed at 02/19/2022 0520 Gross per 24 hour  Intake 580 ml  Output 500 ml  Net 80 ml   Filed Weights   02/17/22 0913 02/18/22 2120  Weight: 73.8 kg 70.6 kg     Data Reviewed:   CBC: Recent Labs  Lab 02/17/22 0948 02/18/22 0426  WBC 10.3 9.6  NEUTROABS 8.9*  --   HGB 16.2 13.3   HCT 51.4 41.9  MCV 91.6 89.3  PLT 177 476   Basic Metabolic Panel: Recent Labs  Lab 02/17/22 1114 02/18/22 0426  NA 138 137  K 4.1 4.0  CL 105 110  CO2 19* 22  GLUCOSE 227* 205*  BUN 17 13  CREATININE 1.30* 1.04  CALCIUM 8.4* 8.5*   GFR: Estimated Creatinine Clearance: 60 mL/min (by C-G formula based on SCr of 1.04 mg/dL). Liver Function Tests: Recent Labs  Lab 02/17/22 1114  AST 26  ALT 19  ALKPHOS 62  BILITOT 1.0  PROT 7.1  ALBUMIN 3.5   No results for input(s): "LIPASE", "AMYLASE" in the last 168 hours. No results for input(s): "AMMONIA" in the last 168 hours. Coagulation Profile: Recent Labs  Lab 02/17/22 0948  INR 1.1   Cardiac Enzymes: No results for input(s): "CKTOTAL", "CKMB", "CKMBINDEX", "TROPONINI" in the last 168 hours. BNP (last 3 results) No results for input(s): "PROBNP" in the last 8760 hours. HbA1C: No results for input(s): "HGBA1C" in the last 72 hours. CBG: Recent Labs  Lab 02/18/22 0800 02/18/22 1203 02/18/22 1631 02/18/22 2113 02/19/22 0822  GLUCAP 201* 231* 181* 191* 219*   Lipid Profile: No results for input(s): "CHOL", "HDL", "LDLCALC", "TRIG", "CHOLHDL", "LDLDIRECT" in the last 72 hours. Thyroid Function Tests: No results for input(s): "TSH", "T4TOTAL", "FREET4", "T3FREE", "THYROIDAB" in the last 72 hours. Anemia Panel: No results for input(s): "VITAMINB12", "FOLATE", "FERRITIN", "TIBC", "IRON", "RETICCTPCT" in the last 72 hours. Sepsis Labs: Recent Labs  Lab 02/17/22 0948 02/17/22 1114 02/17/22 1227  PROCALCITON  --  <0.10  --   LATICACIDVEN 2.6*  --  3.6*    Recent Results (from the past 240 hour(s))  Culture, blood (routine x 2)     Status: None (Preliminary result)   Collection Time: 02/17/22  9:48 AM   Specimen: BLOOD  Result Value Ref Range Status   Specimen Description BLOOD BLOOD LEFT ARM  Final   Special Requests   Final    BOTTLES DRAWN AEROBIC AND ANAEROBIC Blood Culture adequate volume   Culture    Final    NO GROWTH 2 DAYS Performed at [facility], [address], [place], [place] [address]    Report Status PENDING  Incomplete  Culture, blood (routine x 2)     Status: None (Preliminary result)   Collection Time: 02/17/22  9:48 AM   Specimen: BLOOD LEFT ARM  Result Value Ref Range Status   Specimen Description BLOOD LEFT ARM  Final   Special Requests   Final    BOTTLES DRAWN AEROBIC AND ANAEROBIC Blood Culture results may not be optimal due to an excessive volume of blood received in culture bottles   Culture   Final    NO GROWTH 2 DAYS Performed at [facility], [address] [place], [place] [address]    Report Status PENDING  Incomplete  Resp panel by RT-PCR (RSV, Flu A&B, Covid) Anterior Nasal Swab     Status: Abnormal   Collection Time: 02/17/22 11:14 AM   Specimen:  Anterior Nasal Swab  Result Value Ref Range Status   SARS Coronavirus 2 by RT PCR NEGATIVE NEGATIVE Final    Comment: (NOTE) SARS-CoV-2 target nucleic acids are NOT DETECTED.  The SARS-CoV-2 RNA is generally detectable in upper respiratory specimens during the acute phase of infection. The lowest concentration of SARS-CoV-2 viral copies this assay can detect is 138 copies/mL. A negative result does not preclude SARS-Cov-2 infection and should not be used as the sole basis for treatment or other patient management decisions. A negative result may occur with  improper specimen collection/handling, submission of specimen other than nasopharyngeal swab, presence of viral mutation(s) within the areas targeted by this assay, and inadequate number of viral copies(<138 copies/mL). A negative result must be combined with clinical observations, patient history, and epidemiological information. The expected result is Negative.  Fact Sheet for Patients:  EntrepreneurPulse.com.au  Fact Sheet for Healthcare Providers:  IncredibleEmployment.be  This test is  no t yet approved or cleared by the [place] FDA and  has been authorized for detection and/or diagnosis of SARS-CoV-2 by FDA under an Emergency Use Authorization (EUA). This EUA will remain  in effect (meaning this test can be used) for the duration of the COVID-19 declaration under Section 564(b)(1) of the Act, 21 U.S.C.section 360bbb-3(b)(1), unless the authorization is terminated  or revoked sooner.       Influenza A by PCR POSITIVE (A) NEGATIVE Final   Influenza B by PCR NEGATIVE NEGATIVE Final    Comment: (NOTE) The Xpert Xpress SARS-CoV-2/FLU/RSV plus assay is intended as an aid in the diagnosis of influenza from Nasopharyngeal swab specimens and should not be used as a sole basis for treatment. Nasal washings and aspirates are unacceptable for Xpert Xpress SARS-CoV-2/FLU/RSV testing.  Fact Sheet for Patients: EntrepreneurPulse.com.au  Fact Sheet for Healthcare Providers: IncredibleEmployment.be  This test is not yet approved or cleared by the [place] FDA and has been authorized for detection and/or diagnosis of SARS-CoV-2 by FDA under an Emergency Use Authorization (EUA). This EUA will remain in effect (meaning this test can be used) for the duration of the COVID-19 declaration under Section 564(b)(1) of the Act, 21 U.S.C. section 360bbb-3(b)(1), unless the authorization is terminated or revoked.     Resp Syncytial Virus by PCR NEGATIVE NEGATIVE Final    Comment: (NOTE) Fact Sheet for Patients: EntrepreneurPulse.com.au  Fact Sheet for Healthcare Providers: IncredibleEmployment.be  This test is not yet approved or cleared by the [place] FDA and has been authorized for detection and/or diagnosis of SARS-CoV-2 by FDA under an Emergency Use Authorization (EUA). This EUA will remain in effect (meaning this test can be used) for the duration of the COVID-19 declaration under Section  564(b)(1) of the Act, 21 U.S.C. section 360bbb-3(b)(1), unless the authorization is terminated or revoked.  Performed at [facility], [address] [place], [place] [address]   Expectorated Sputum Assessment w Gram Stain, Rflx to Resp Cult     Status: None   Collection Time: 02/19/22 12:10 AM   Specimen: Expectorated Sputum  Result Value Ref Range Status   Specimen Description EXPECTORATED SPUTUM  Final   Special Requests NONE  Final   Sputum evaluation   Final    THIS SPECIMEN IS ACCEPTABLE FOR SPUTUM CULTURE Performed at [facility], [address], [place], [place] [address]    Report Status 02/19/2022 FINAL  Final  Culture, Respiratory w Gram Stain     Status: None (Preliminary result)   Collection Time: 02/19/22 12:10  AM  Result Value Ref Range Status   Specimen Description   Final    EXPECTORATED SPUTUM Performed at [facility], [address] [place], [place] [address]    Special Requests   Final    NONE Reflexed from [phone] Performed at [facility], [place], [place] [address]    Gram Stain   Final    FEW WBC PRESENT, PREDOMINANTLY PMN RARE GRAM POSITIVE COCCI IN PAIRS RARE GRAM NEGATIVE RODS Performed at [facility], [place] [address], [place], [place] [address]    Culture PENDING  Incomplete   Report Status PENDING  Incomplete         Radiology Studies: CT Angio Chest Pulmonary Embolism (PE) W or WO Contrast  Result Date: 02/18/2022 CLINICAL DATA:  Pulmonary embolism (PE) suspected, high prob EXAM: CT ANGIOGRAPHY CHEST WITH CONTRAST TECHNIQUE: Multidetector CT imaging of the chest was performed using the standard protocol during bolus administration of intravenous contrast. Multiplanar CT image reconstructions and MIPs were obtained to evaluate the vascular anatomy. RADIATION DOSE REDUCTION: This exam was performed according to the departmental dose-optimization program which includes automated  exposure control, adjustment of the mA and/or kV according to patient size and/or use of iterative reconstruction technique. CONTRAST:  70mL OMNIPAQUE IOHEXOL 350 MG/ML SOLN COMPARISON:  CT chest 01/01/2022, PET CT 08/28/2021 FINDINGS: Cardiovascular: Satisfactory opacification of the pulmonary arteries to the segmental level. No evidence of pulmonary embolism. Normal heart size. No significant pericardial effusion. The thoracic aorta is normal in caliber. Moderate atherosclerotic plaque of the thoracic aorta. Four-vessel coronary artery calcifications. Mediastinum/Nodes: Interval increase in size of a 17 mm (from 11 mm) right hilar lymph node. No enlarged mediastinal or axillary lymph nodes. Thyroid gland, trachea, and esophagus demonstrate no significant findings. LUngs/Pleura Biapical pleural/pulmonary scarring. Severe emphysematous changes. Left lower lobe enhancing airspace opacity with underlying associated trace left pleural effusion. Stable left lower lobe 6 mm pulmonary nodule. Interval increase in size of a nodular like 24 x 28 mm right posterior paravertebral airspace opacity adjacent to postsurgical changes (5:83). Stable 15 x 12 mm spiculated right lower lobe pulmonary nodule. Stable 6 mm right lower lobe pulmonary nodule (5:11). Stable 16 x 11 mm right lower lobe base pulmonary nodule (5:128). A 9 x 7 mm right lower lobe posterior base subpleural nodule (5:133) likely present on prior study but difficult to visualize due to associated atelectasis. No pulmonary mass. No right pleural effusion. Few right pleural calcifications. No pneumothorax. Upper Abdomen: No acute abnormality. Tiny calcifications within the spleen and liver consistent with sequelae of prior granulomatous disease. Musculoskeletal: No chest wall abnormality. No suspicious lytic or blastic osseous lesions. No acute displaced fracture. Review of the MIP images confirms the above findings. IMPRESSION: 1. No pulmonary embolus. 2. Interval  increase in size of a nodular like 24 x 28 mm right posterior paravertebral airspace opacity adjacent to postsurgical changes. Additional imaging evaluation or consultation with Pulmonology or Thoracic Surgery recommended. 3. Interval increase in size of a 17 mm (from 11 mm) right hilar lymph node. 4. Several stable pulmonary nodules within the right lung (including a 16 x 11 mm right lower lobe nodule that was avid on PET CT) as well as a single stable pulmonary nodule within left lung. 5. Trace left pleural effusion with associated passive atelectasis. 6. Emphysema (ICD10-J43.9) - severe. 7. Aortic Atherosclerosis (ICD10-I70.0) including four-vessel coronary calcification. Electronically Signed   By: Iven Finn M.D.   On: 02/18/2022 00:54  DG Chest Port 1 View  Result Date: 02/17/2022 CLINICAL DATA:  Shortness of breath for 2 days.  Sepsis.  COPD. EXAM: PORTABLE CHEST 1 VIEW COMPARISON:  07/09/2021 FINDINGS: The heart size and mediastinal contours are within normal limits. Severe emphysema again noted. Stable pleural-parenchymal scarring in the right lung base. No evidence of superimposed pulmonary infiltrate or pleural effusion. IMPRESSION: Severe emphysema and right basilar scarring. No acute findings. Electronically Signed   By: Marlaine Hind M.D.   On: 02/17/2022 09:33        Scheduled Meds:  aspirin EC  81 mg Oral QPM   budesonide (PULMICORT) nebulizer solution  0.25 mg Nebulization BID   doxycycline  100 mg Oral Q12H   enoxaparin (LOVENOX) injection  40 mg Subcutaneous Z16R   folic acid  1 mg Oral Daily   insulin aspart  0-5 Units Subcutaneous QHS   insulin aspart  0-9 Units Subcutaneous TID WC   ipratropium-albuterol  3 mL Nebulization Q6H   LORazepam  0-4 mg Intravenous Q6H   Followed by   LORazepam  0-4 mg Intravenous Q12H   methylPREDNISolone (SOLU-MEDROL) injection  40 mg Intravenous Q12H   metoprolol tartrate  50 mg Oral BID   montelukast  10 mg Oral QHS   multivitamin  with minerals  1 tablet Oral Daily   oseltamivir  30 mg Oral BID   rosuvastatin  40 mg Oral QHS   thiamine  100 mg Oral Daily   Or   thiamine  100 mg Intravenous Daily   Continuous Infusions:  sodium chloride 75 mL/hr at 02/19/22 0520     LOS: 2 days   Time spent= 35 mins    Bradley Murren Arsenio Loader, MD [facility]  If 7PM-7AM, please contact night-coverage  02/19/2022, 9:25 AM

## 2022-02-20 ENCOUNTER — Ambulatory Visit: Payer: Medicare HMO

## 2022-02-20 DIAGNOSIS — R6520 Severe sepsis without septic shock: Secondary | ICD-10-CM

## 2022-02-20 DIAGNOSIS — J441 Chronic obstructive pulmonary disease with (acute) exacerbation: Secondary | ICD-10-CM | POA: Diagnosis not present

## 2022-02-20 DIAGNOSIS — A419 Sepsis, unspecified organism: Secondary | ICD-10-CM | POA: Diagnosis not present

## 2022-02-20 DIAGNOSIS — J101 Influenza due to other identified influenza virus with other respiratory manifestations: Secondary | ICD-10-CM | POA: Diagnosis not present

## 2022-02-20 LAB — GLUCOSE, CAPILLARY
Glucose-Capillary: 225 mg/dL — ABNORMAL HIGH (ref 70–99)
Glucose-Capillary: 236 mg/dL — ABNORMAL HIGH (ref 70–99)
Glucose-Capillary: 184 mg/dL — ABNORMAL HIGH (ref 70–99)
Glucose-Capillary: 236 mg/dL — ABNORMAL HIGH (ref 70–99)
Glucose-Capillary: 264 mg/dL — ABNORMAL HIGH (ref 70–99)

## 2022-02-20 LAB — CBC
HCT: 42.9 % (ref 39.0–52.0)
Hemoglobin: 13.7 g/dL (ref 13.0–17.0)
MCH: 28.4 pg (ref 26.0–34.0)
MCHC: 31.9 g/dL (ref 30.0–36.0)
MCV: 89 fL (ref 80.0–100.0)
Platelets: 168 10*3/uL (ref 150–400)
RBC: 4.82 MIL/uL (ref 4.22–5.81)
RDW: 16 % — ABNORMAL HIGH (ref 11.5–15.5)
WBC: 8.6 10*3/uL (ref 4.0–10.5)
nRBC: 0 % (ref 0.0–0.2)

## 2022-02-20 LAB — BASIC METABOLIC PANEL
Anion gap: 4 — ABNORMAL LOW (ref 5–15)
BUN: 21 mg/dL (ref 8–23)
CO2: 22 mmol/L (ref 22–32)
Calcium: 8 mg/dL — ABNORMAL LOW (ref 8.9–10.3)
Chloride: 112 mmol/L — ABNORMAL HIGH (ref 98–111)
Creatinine, Ser: 1 mg/dL (ref 0.61–1.24)
GFR, Estimated: >60 mL/min (ref >60)
Glucose, Bld: 196 mg/dL — ABNORMAL HIGH (ref 70–99)
Potassium: 4.1 mmol/L (ref 3.5–5.1)
Sodium: 138 mmol/L (ref 135–145)

## 2022-02-20 LAB — BLOOD GAS, VENOUS
Acid-base deficit: 1.1 mmol/L (ref 0.0–2.0)
Bicarbonate: 24.8 mmol/L (ref 20.0–28.0)
O2 Saturation: 16.9 %
Patient temperature: 37
pCO2, Ven: 45 mmHg (ref 44–60)
pH, Ven: 7.35 (ref 7.25–7.43)

## 2022-02-20 LAB — MAGNESIUM: Magnesium: 2.1 mg/dL (ref 1.7–2.4)

## 2022-02-20 LAB — C DIFFICILE QUICK SCREEN W PCR REFLEX
C Diff antigen: NEGATIVE
C Diff interpretation: NOT DETECTED
C Diff toxin: NEGATIVE

## 2022-02-20 MED ORDER — FLUTICASONE PROPIONATE 50 MCG/ACT NA SUSP
1.0000 | Freq: Every day | NASAL | Status: DC
  Administered 2022-02-20 – 2022-02-21 (×2): 1 via NASAL
  Filled 2022-02-20: qty 16

## 2022-02-20 NOTE — Progress Notes (Signed)
Progress Note    SIR MALLIS  XVQ:[id number] DOB: 31-Jul-1950  DOA: 02/17/2022 PCP: Valera Castle, MD      Brief Narrative:    Medical records reviewed and are as summarized below:  Bradley [name] is a 71 y.o. male with medical history significant of hypertension, hyperlipidemia, diabetes mellitus, COPD, on 3L of oxygen at home, depression with anxiety, CKD-3A, PVD, former smoker, alcohol abuse, CAD, NSCLC on XRT, who presents with shortness breath.  Patient was diagnosed with influenza A positive likely triggering his COPD exacerbation.     Assessment/Plan:   Principal Problem:   Influenza A Active Problems:   Acute on chronic respiratory failure with hypoxia (HCC)   COPD exacerbation (HCC)   Severe sepsis (HCC)   Essential hypertension   HLD (hyperlipidemia)   Type II diabetes mellitus with renal manifestations (HCC)   Chronic kidney disease, stage 3a (HCC)   CAD (coronary artery disease)   Non-small cell lung cancer (NSCLC) (HCC)   Nausea vomiting and diarrhea   Alcohol abuse    Body mass index is 24.37 kg/m.   Severe sepsis due to influenza A infection: Continue Tamiflu.  Lactic acid was 3.6 on admission.  Discontinue IV fluids.   COPD exacerbation: Continue IV Solu-Medrol and bronchodilators  Acute on chronic hypoxic respiratory failure: Uses 3.5 L/min oxygen at baseline but sometimes goes up to 4-4.5 L/min oxygen with activity.  He is currently requiring 5 L/min oxygen.   Nausea, vomiting and diarrhea: Improved. This is probably from influenza A infection.   Non-small cell lung cancer: He is on radiation therapy.  Follow-up with radiation oncologist as an outpatient.   Other comorbidities include CKD stage IIIa, hypertension, hyperlipidemia, type II DM   Diet Order             Diet heart healthy/carb modified Fluid consistency: Thin  Diet effective now                             Consultants: None  Procedures: None    Medications:    aspirin EC  81 mg Oral QPM   budesonide (PULMICORT) nebulizer solution  0.25 mg Nebulization BID   dextromethorphan-guaiFENesin  1 tablet Oral BID   doxycycline  100 mg Oral Q12H   enoxaparin (LOVENOX) injection  40 mg Subcutaneous Q24H   fluticasone  1 spray Each Nare Daily   folic acid  1 mg Oral Daily   insulin aspart  0-5 Units Subcutaneous QHS   insulin aspart  0-9 Units Subcutaneous TID WC   ipratropium-albuterol  3 mL Nebulization Q6H   LORazepam  0-4 mg Intravenous Q12H   methylPREDNISolone (SOLU-MEDROL) injection  40 mg Intravenous Q12H   metoprolol tartrate  50 mg Oral BID   montelukast  10 mg Oral QHS   multivitamin with minerals  1 tablet Oral Daily   oseltamivir  75 mg Oral BID   rosuvastatin  40 mg Oral QHS   thiamine  100 mg Oral Daily   Or   thiamine  100 mg Intravenous Daily   Continuous Infusions:     Anti-infectives (From admission, onward)    Start     Dose/Rate Route Frequency Ordered Stop   02/19/22 2200  oseltamivir (TAMIFLU) capsule 75 mg        75 mg Oral 2 times daily 02/19/22 1353 02/22/22 0959   02/18/22 0900  cefTRIAXone (ROCEPHIN) 1 g in sodium chloride 0.9 %  100 mL IVPB  Status:  Discontinued        1 g 200 mL/hr over 30 Minutes Intravenous Every 24 hours 02/17/22 1100 02/17/22 1105   02/17/22 2200  doxycycline (VIBRA-TABS) tablet 100 mg  Status:  Discontinued        100 mg Oral Every 12 hours 02/17/22 1100 02/17/22 1105   02/17/22 2200  doxycycline (VIBRA-TABS) tablet 100 mg        100 mg Oral Every 12 hours 02/17/22 1106     02/17/22 1300  oseltamivir (TAMIFLU) capsule 30 mg  Status:  Discontinued        30 mg Oral 2 times daily 02/17/22 1216 02/19/22 1353   02/17/22 0945  cefTRIAXone (ROCEPHIN) 1 g in sodium chloride 0.9 % 100 mL IVPB        1 g 200 mL/hr over 30 Minutes Intravenous  Once 02/17/22 0936 02/17/22 1111   02/17/22 0945  doxycycline  (VIBRA-TABS) tablet 100 mg        100 mg Oral  Once 02/17/22 3810 02/17/22 0956              Family Communication/Anticipated D/C date and plan/Code Status   DVT prophylaxis: enoxaparin (LOVENOX) injection 40 mg Start: 02/17/22 2200     Code Status: Full Code  Family Communication: None Disposition Plan: Plan to discharge home in 1 to 2 days   Status is: Inpatient Remains inpatient appropriate because: Influenza A infection, COPD exacerbation       Subjective:   Interval events noted.  He complains of cough, shortness of breath, chest tightness and generalized weakness.    Objective:    Vitals:   02/20/22 0441 02/20/22 0801 02/20/22 1217 02/20/22 1326  BP: 106/76 (!) 142/78 122/75   Pulse: 72 71 73   Resp: 18 16 16    Temp: 97.7 F (36.5 C) 97.6 F (36.4 C) 97.9 F (36.6 C)   TempSrc: Oral     SpO2: 92% 96% 93% 91%  Weight:      Height:       No data found.   Intake/Output Summary (Last 24 hours) at 02/20/2022 1721 Last data filed at 02/20/2022 1208 Gross per 24 hour  Intake 1274.22 ml  Output 2650 ml  Net -1375.78 ml   Filed Weights   02/17/22 0913 02/18/22 2120  Weight: 73.8 kg 70.6 kg    Exam:   GEN: NAD SKIN: Warm and dry EYES: No pallor or icterus ENT: MMM CV: RRR PULM: Decreased air entry bilaterally, bilateral wheezing ABD: soft, ND, NT, +BS CNS: AAO x 3, non focal EXT: No edema or tenderness       Data Reviewed:   I have personally reviewed following labs and imaging studies:  Labs: Labs show the following:   Basic Metabolic Panel: Recent Labs  Lab 02/17/22 1114 02/18/22 0426 02/20/22 0618  NA 138 137 138  K 4.1 4.0 4.1  CL 105 110 112*  CO2 19* 22 22  GLUCOSE 227* 205* 196*  BUN 17 13 21   CREATININE 1.30* 1.04 1.00  CALCIUM 8.4* 8.5* 8.0*  MG  --   --  2.1   GFR Estimated Creatinine Clearance: 62.4 mL/min (by C-G formula based on SCr of 1 mg/dL). Liver Function Tests: Recent Labs  Lab  02/17/22 1114  AST 26  ALT 19  ALKPHOS 62  BILITOT 1.0  PROT 7.1  ALBUMIN 3.5   No results for input(s): "LIPASE", "AMYLASE" in the last 168 hours. No results for  input(s): "AMMONIA" in the last 168 hours. Coagulation profile Recent Labs  Lab 02/17/22 0948  INR 1.1    CBC: Recent Labs  Lab 02/17/22 0948 02/18/22 0426 02/20/22 0618  WBC 10.3 9.6 8.6  NEUTROABS 8.9*  --   --   HGB 16.2 13.3 13.7  HCT 51.4 41.9 42.9  MCV 91.6 89.3 89.0  PLT 177 180 168   Cardiac Enzymes: No results for input(s): "CKTOTAL", "CKMB", "CKMBINDEX", "TROPONINI" in the last 168 hours. BNP (last 3 results) No results for input(s): "PROBNP" in the last 8760 hours. CBG: Recent Labs  Lab 02/19/22 1634 02/19/22 2043 02/20/22 0801 02/20/22 1217 02/20/22 1703  GLUCAP 254* 247* 225* 236* 184*   D-Dimer: No results for input(s): "DDIMER" in the last 72 hours. Hgb A1c: No results for input(s): "HGBA1C" in the last 72 hours. Lipid Profile: No results for input(s): "CHOL", "HDL", "LDLCALC", "TRIG", "CHOLHDL", "LDLDIRECT" in the last 72 hours. Thyroid function studies: No results for input(s): "TSH", "T4TOTAL", "T3FREE", "THYROIDAB" in the last 72 hours.  Invalid input(s): "FREET3" Anemia work up: No results for input(s): "VITAMINB12", "FOLATE", "FERRITIN", "TIBC", "IRON", "RETICCTPCT" in the last 72 hours. Sepsis Labs: Recent Labs  Lab 02/17/22 0948 02/17/22 1114 02/17/22 1227 02/18/22 0426 02/20/22 0618  PROCALCITON  --  <0.10  --   --   --   WBC 10.3  --   --  9.6 8.6  LATICACIDVEN 2.6*  --  3.6*  --   --     Microbiology Recent Results (from the past 240 hour(s))  Culture, blood (routine x 2)     Status: None (Preliminary result)   Collection Time: 02/17/22  9:48 AM   Specimen: BLOOD  Result Value Ref Range Status   Specimen Description BLOOD BLOOD LEFT ARM  Final   Special Requests   Final    BOTTLES DRAWN AEROBIC AND ANAEROBIC Blood Culture adequate volume   Culture    Final    NO GROWTH 3 DAYS Performed at [facility], [address], [place], [place] [address]    Report Status PENDING  Incomplete  Culture, blood (routine x 2)     Status: None (Preliminary result)   Collection Time: 02/17/22  9:48 AM   Specimen: BLOOD LEFT ARM  Result Value Ref Range Status   Specimen Description BLOOD LEFT ARM  Final   Special Requests   Final    BOTTLES DRAWN AEROBIC AND ANAEROBIC Blood Culture results may not be optimal due to an excessive volume of blood received in culture bottles   Culture   Final    NO GROWTH 3 DAYS Performed at [facility], [address] [place], [place] [address]    Report Status PENDING  Incomplete  Resp panel by RT-PCR (RSV, Flu A&B, Covid) Anterior Nasal Swab     Status: Abnormal   Collection Time: 02/17/22 11:14 AM   Specimen: Anterior Nasal Swab  Result Value Ref Range Status   SARS Coronavirus 2 by RT PCR NEGATIVE NEGATIVE Final    Comment: (NOTE) SARS-CoV-2 target nucleic acids are NOT DETECTED.  The SARS-CoV-2 RNA is generally detectable in upper respiratory specimens during the acute phase of infection. The lowest concentration of SARS-CoV-2 viral copies this assay can detect is 138 copies/mL. A negative result does not preclude SARS-Cov-2 infection and should not be used as the sole basis for treatment or other patient management decisions. A negative result may occur with  improper specimen collection/handling, submission of specimen other than nasopharyngeal  swab, presence of viral mutation(s) within the areas targeted by this assay, and inadequate number of viral copies(<138 copies/mL). A negative result must be combined with clinical observations, patient history, and epidemiological information. The expected result is Negative.  Fact Sheet for Patients:  EntrepreneurPulse.com.au  Fact Sheet for Healthcare Providers:  IncredibleEmployment.be  This test is  no t yet approved or cleared by the [place] FDA and  has been authorized for detection and/or diagnosis of SARS-CoV-2 by FDA under an Emergency Use Authorization (EUA). This EUA will remain  in effect (meaning this test can be used) for the duration of the COVID-19 declaration under Section 564(b)(1) of the Act, 21 U.S.C.section 360bbb-3(b)(1), unless the authorization is terminated  or revoked sooner.       Influenza A by PCR POSITIVE (A) NEGATIVE Final   Influenza B by PCR NEGATIVE NEGATIVE Final    Comment: (NOTE) The Xpert Xpress SARS-CoV-2/FLU/RSV plus assay is intended as an aid in the diagnosis of influenza from Nasopharyngeal swab specimens and should not be used as a sole basis for treatment. Nasal washings and aspirates are unacceptable for Xpert Xpress SARS-CoV-2/FLU/RSV testing.  Fact Sheet for Patients: EntrepreneurPulse.com.au  Fact Sheet for Healthcare Providers: IncredibleEmployment.be  This test is not yet approved or cleared by the [place] FDA and has been authorized for detection and/or diagnosis of SARS-CoV-2 by FDA under an Emergency Use Authorization (EUA). This EUA will remain in effect (meaning this test can be used) for the duration of the COVID-19 declaration under Section 564(b)(1) of the Act, 21 U.S.C. section 360bbb-3(b)(1), unless the authorization is terminated or revoked.     Resp Syncytial Virus by PCR NEGATIVE NEGATIVE Final    Comment: (NOTE) Fact Sheet for Patients: EntrepreneurPulse.com.au  Fact Sheet for Healthcare Providers: IncredibleEmployment.be  This test is not yet approved or cleared by the [place] FDA and has been authorized for detection and/or diagnosis of SARS-CoV-2 by FDA under an Emergency Use Authorization (EUA). This EUA will remain in effect (meaning this test can be used) for the duration of the COVID-19 declaration under Section  564(b)(1) of the Act, 21 U.S.C. section 360bbb-3(b)(1), unless the authorization is terminated or revoked.  Performed at [facility], [address] [place], [place] [address]   Expectorated Sputum Assessment w Gram Stain, Rflx to Resp Cult     Status: None   Collection Time: 02/19/22 12:10 AM   Specimen: Expectorated Sputum  Result Value Ref Range Status   Specimen Description EXPECTORATED SPUTUM  Final   Special Requests NONE  Final   Sputum evaluation   Final    THIS SPECIMEN IS ACCEPTABLE FOR SPUTUM CULTURE Performed at [facility], [address] [place], [place] [address]    Report Status 02/19/2022 FINAL  Final  Culture, Respiratory w Gram Stain     Status: None (Preliminary result)   Collection Time: 02/19/22 12:10 AM  Result Value Ref Range Status   Specimen Description   Final    EXPECTORATED SPUTUM Performed at [facility], [address], [place], [place] [address]    Special Requests   Final    NONE Reflexed from [id number] Performed at [facility], [address] [place], [place] [address]    Gram Stain   Final    FEW WBC PRESENT, PREDOMINANTLY PMN RARE GRAM POSITIVE COCCI IN PAIRS RARE GRAM NEGATIVE RODS    Culture   Final    CULTURE REINCUBATED FOR BETTER GROWTH Performed at [facility], [place] [address], [place], [place] [address]    Report Status PENDING  Incomplete  C Difficile Quick Screen w PCR reflex     Status: None   Collection Time: 02/19/22  8:40 PM   Specimen: STOOL  Result Value Ref Range Status   C Diff antigen NEGATIVE NEGATIVE Final   C Diff toxin NEGATIVE NEGATIVE Final   C Diff interpretation No C. difficile detected.  Final    Comment: Performed at [facility], [address] [place], [place] [address]  Gastrointestinal Panel by PCR , Stool     Status: None   Collection Time: 02/19/22  8:40 PM   Specimen: Stool  Result Value Ref Range Status   Campylobacter species NOT  DETECTED NOT DETECTED Final   Plesimonas shigelloides NOT DETECTED NOT DETECTED Final   Salmonella species NOT DETECTED NOT DETECTED Final   Yersinia enterocolitica NOT DETECTED NOT DETECTED Final   Vibrio species NOT DETECTED NOT DETECTED Final   Vibrio cholerae NOT DETECTED NOT DETECTED Final   Enteroaggregative E coli (EAEC) NOT DETECTED NOT DETECTED Final   Enteropathogenic E coli (EPEC) NOT DETECTED NOT DETECTED Final   Enterotoxigenic E coli (ETEC) NOT DETECTED NOT DETECTED Final   Shiga like toxin producing E coli (STEC) NOT DETECTED NOT DETECTED Final   Shigella/Enteroinvasive E coli (EIEC) NOT DETECTED NOT DETECTED Final   Cryptosporidium NOT DETECTED NOT DETECTED Final   Cyclospora cayetanensis NOT DETECTED NOT DETECTED Final   Entamoeba histolytica NOT DETECTED NOT DETECTED Final   Giardia lamblia NOT DETECTED NOT DETECTED Final   Adenovirus F40/41 NOT DETECTED NOT DETECTED Final   Astrovirus NOT DETECTED NOT DETECTED Final   Norovirus GI/GII NOT DETECTED NOT DETECTED Final   Rotavirus A NOT DETECTED NOT DETECTED Final   Sapovirus (I, II, IV, and V) NOT DETECTED NOT DETECTED Final    Comment: Performed at [facility], [address] [place], [place] [address]    Procedures and diagnostic studies:  No results found.             LOS: 3 days   Brinton Brandel  [facility]   Pager on [email or web address]. If 7PM-7AM, please contact night-coverage at [email or web address]     02/20/2022, 5:21 PM

## 2022-02-20 NOTE — Inpatient Diabetes Management (Signed)
Inpatient Diabetes Program Recommendations  AACE/ADA: New Consensus Statement on Inpatient Glycemic Control (2015)  Target Ranges:  Prepandial:   less than 140 mg/dL      Peak postprandial:   less than 180 mg/dL (1-2 hours)      Critically ill patients:  140 - 180 mg/dL    Latest Reference Range & Units 02/19/22 08:22 02/19/22 12:13 02/19/22 16:34 02/19/22 20:43  Glucose-Capillary 70 - 99 mg/dL 219 (H)  2 units Novolog  203 (H)  3 units Novolog  254 (H)  5 units Novolog  247 (H)  2 units Novolog   (H): Data is abnormally high  Latest Reference Range & Units 02/20/22 08:01  Glucose-Capillary 70 - 99 mg/dL 225 (H)  3 units Novolog   (H): Data is abnormally high   Home DM Meds: Tradjenta 5 mg Daily  Metformin 750 mg BID   Current Orders: Novolog Sensitive Correction Scale/ SSI (0-9 units) TID AC + HS     MD- Note pt getting Solumedrol 40 mg BID  AM CBG >200 the last 2 days  Please consider adding Semglee 10 units Daily (0.15 units/kg)    --Will follow patient during hospitalization--  Wyn Quaker RN, MSN, [place] Diabetes Coordinator Inpatient Glycemic Control Team Team Pager: [phone] (8a-5p)

## 2022-02-21 DIAGNOSIS — J441 Chronic obstructive pulmonary disease with (acute) exacerbation: Secondary | ICD-10-CM | POA: Diagnosis not present

## 2022-02-21 DIAGNOSIS — J101 Influenza due to other identified influenza virus with other respiratory manifestations: Secondary | ICD-10-CM | POA: Diagnosis not present

## 2022-02-21 DIAGNOSIS — A419 Sepsis, unspecified organism: Secondary | ICD-10-CM | POA: Diagnosis not present

## 2022-02-21 DIAGNOSIS — J9621 Acute and chronic respiratory failure with hypoxia: Secondary | ICD-10-CM | POA: Diagnosis not present

## 2022-02-21 LAB — BASIC METABOLIC PANEL
Anion gap: 9 (ref 5–15)
BUN: 28 mg/dL — ABNORMAL HIGH (ref 8–23)
CO2: 20 mmol/L — ABNORMAL LOW (ref 22–32)
Calcium: 8.4 mg/dL — ABNORMAL LOW (ref 8.9–10.3)
Chloride: 109 mmol/L (ref 98–111)
Creatinine, Ser: 1.05 mg/dL (ref 0.61–1.24)
GFR, Estimated: >60 mL/min (ref >60)
Glucose, Bld: 219 mg/dL — ABNORMAL HIGH (ref 70–99)
Potassium: 4.7 mmol/L (ref 3.5–5.1)
Sodium: 138 mmol/L (ref 135–145)

## 2022-02-21 LAB — CULTURE, RESPIRATORY W GRAM STAIN: Culture: NORMAL

## 2022-02-21 LAB — LACTIC ACID, PLASMA: Lactic Acid, Venous: 1 mmol/L (ref 0.5–1.9)

## 2022-02-21 LAB — GLUCOSE, CAPILLARY
Glucose-Capillary: 192 mg/dL — ABNORMAL HIGH (ref 70–99)
Glucose-Capillary: 256 mg/dL — ABNORMAL HIGH (ref 70–99)

## 2022-02-21 MED ORDER — GUAIFENESIN 100 MG/5ML PO LIQD: 5.0000 mL | Freq: Four times a day (QID) | ORAL | 0 refills | Status: DC | PRN

## 2022-02-21 MED ORDER — PREDNISONE 20 MG PO TABS: 40.0000 mg | ORAL_TABLET | Freq: Every day | ORAL | 0 refills | Status: AC

## 2022-02-21 NOTE — Progress Notes (Signed)
Mobility Specialist - Progress Note   02/21/22 1035  Mobility  Activity Ambulated with assistance in room;Transferred from bed to chair;Dangled on edge of bed;Stood at bedside  Level of Assistance Standby assist, set-up cues, supervision of patient - no hands on  Assistive Device None  Distance Ambulated (ft) 5 ft  Activity Response Tolerated well  Mobility Referral Yes  $Mobility charge 1 Mobility   Pt supine in bed on 5L upon arrival. Pt STS and ambulates from bed/to chair SBA with no LOB. Pt left in recliner with needs in reach and chair alarm set.   Gretchen Short  Mobility Specialist  02/21/22 10:36 AM

## 2022-02-21 NOTE — Progress Notes (Signed)
Patient to dc home today, reports family is picking him up. Reports no needs, patient is currently on 5L O2 and is baseline 3L O2 with [employer].   CSW has informed Kristen with [employer] of O2 increase, orders and sat note are in.   No further dc needs at this time.   Kelby Fam, [place], MSW, [place]

## 2022-02-21 NOTE — Care Management Important Message (Signed)
Important Message  Patient Details  Name: Bradley [name] MRN: [id number] Date of Birth: 1950/12/31   Medicare Important Message Given:  Yes  Patient is in an isolation room so I reviewed the Important Message from Medicare by phone [phone]). He is in agreement with his discharge. I wished him well and thanked him for his time.    Juliann Pulse A Amijah Timothy 02/21/2022, 1:03 PM

## 2022-02-21 NOTE — Progress Notes (Signed)
SATURATION QUALIFICATIONS: (This note is used to comply with regulatory documentation for home oxygen)  Patient Saturations on Room Air at Rest = 82%  Patient Saturations on Room Air while Ambulating = 77%  Patient Saturations on 5 Liters of oxygen while Ambulating = 91%  Please briefly explain why patient needs home oxygen:  Patient needs increase oxygen demand requiring 5L of oxygen.

## 2022-02-21 NOTE — Discharge Summary (Signed)
Physician Discharge Summary   Patient: Bradley [name] MRN: [id number] DOB: 12/12/1950  Admit date:     02/17/2022  Discharge date: 02/21/22  Discharge Physician: Jennye Boroughs   PCP: Valera Castle, MD   Recommendations at discharge:    Follow up with PCP in 1 week  Discharge Diagnoses: Active Problems:   Essential hypertension   HLD (hyperlipidemia)   Type II diabetes mellitus with renal manifestations (HCC)   Chronic kidney disease, stage 3a (HCC)   CAD (coronary artery disease)   Non-small cell lung cancer (NSCLC) (HCC)   Nausea vomiting and diarrhea   Alcohol abuse  Principal Problem (Resolved):   Influenza A Resolved Problems:   Acute on chronic respiratory failure with hypoxia (HCC)   COPD exacerbation (HCC)   Severe sepsis [facility])  Hospital Course:  [name] is a 71 y.o. male with medical history significant for hypertension, hyperlipidemia, diabetes mellitus, COPD, on 3-4.5 L /min oxygen at home, depression with anxiety, CKD-3A, PVD, former smoker, alcohol use disorder, CAD, NSCLC on XRT, who presented to the hospital with shortness of breath.  He was found to have severe sepsis secondary to influenza A infection and COPD exacerbation complicated by acute hypoxic respiratory failure.  He was treated with bronchodilators, Tamiflu and doxycycline.  He also had nausea, vomiting and diarrhea which was likely from the influenza A infection.  Overall, his condition has improved.  He feels better and ready for discharge to home today.         Consultants: None Procedures performed: None  Disposition: Home Diet recommendation:  Discharge Diet Orders (From admission, onward)     Start     Ordered   02/21/22 0000  Diet - low sodium heart healthy        02/21/22 1255           Cardiac diet DISCHARGE MEDICATION: Allergies as of 02/21/2022       Reactions   Ace Inhibitors Other (See Comments)   Hypotension        Medication List     STOP  taking these medications    brimonidine 0.2 % ophthalmic solution Commonly known as: ALPHAGAN   Fluticasone-Salmeterol 250-50 MCG/DOSE Aepb Commonly known as: ADVAIR   OXYGEN   polyethylene glycol 17 g packet Commonly known as: MIRALAX / GLYCOLAX       TAKE these medications    albuterol 108 (90 Base) MCG/ACT inhaler Commonly known as: VENTOLIN HFA 2 puffs every 6 (six) hours as needed.   aspirin EC 81 MG tablet Take 81 mg by mouth every evening.   furosemide 20 MG tablet Commonly known as: LASIX Take 20 mg by mouth as needed for edema.   guaiFENesin 100 MG/5ML liquid Commonly known as: ROBITUSSIN Take 5 mLs by mouth every 6 (six) hours as needed for cough or to loosen phlegm.   ipratropium-albuterol 0.5-2.5 (3) MG/3ML Soln Commonly known as: DUONEB Take 3 mLs by nebulization every 4 (four) hours as needed.   levalbuterol 0.31 MG/3ML nebulizer solution Commonly known as: XOPENEX Take 1 ampule by nebulization every 4 (four) hours as needed for wheezing.   linagliptin 5 MG Tabs tablet Commonly known as: TRADJENTA Take 5 mg by mouth every morning.   metFORMIN 750 MG 24 hr tablet Commonly known as: GLUCOPHAGE-XR Take 750 mg by mouth 2 (two) times daily.   metoprolol tartrate 50 MG tablet Commonly known as: LOPRESSOR Take 50 mg by mouth 2 (two) times daily.   montelukast 10  MG tablet Commonly known as: SINGULAIR Take 10 mg by mouth at bedtime.   nitroGLYCERIN 0.4 MG SL tablet Commonly known as: NITROSTAT Place 0.4 mg under the tongue every 5 (five) minutes x 3 doses as needed for chest pain.   predniSONE 20 MG tablet Commonly known as: DELTASONE Take 2 tablets (40 mg total) by mouth daily for 2 days. Start taking on: February 22, 2022   rosuvastatin 40 MG tablet Commonly known as: CRESTOR Take 40 mg by mouth every morning.   sildenafil 20 MG tablet Commonly known as: REVATIO Take 20-100 mg by mouth as needed.   tiotropium 18 MCG inhalation  capsule Commonly known as: SPIRIVA Place 18 mcg into inhaler and inhale every morning.               Durable Medical Equipment  (From admission, onward)           Start     Ordered   02/21/22 0000  For home use only DME oxygen       Question Answer Comment  Length of Need Lifetime   Mode or (Route) Nasal cannula   Liters per Minute 5   Frequency Continuous (stationary and portable oxygen unit needed)   Oxygen conserving device Yes   Oxygen delivery system Gas      02/21/22 1411            Discharge Exam: Filed Weights   02/17/22 0913 02/18/22 2120  Weight: 73.8 kg 70.6 kg   GEN: NAD SKIN: Warm and dry EYES: Anicteric ENT: MMM CV: RRR PULM: CTA B ABD: soft, ND, NT, +BS CNS: AAO x 3, non focal EXT: No edema or tenderness   Condition at discharge: good  The results of significant diagnostics from this hospitalization (including imaging, microbiology, ancillary and laboratory) are listed below for reference.   Imaging Studies: CT Angio Chest Pulmonary Embolism (PE) W or WO Contrast  Result Date: 02/18/2022 CLINICAL DATA:  Pulmonary embolism (PE) suspected, high prob EXAM: CT ANGIOGRAPHY CHEST WITH CONTRAST TECHNIQUE: Multidetector CT imaging of the chest was performed using the standard protocol during bolus administration of intravenous contrast. Multiplanar CT image reconstructions and MIPs were obtained to evaluate the vascular anatomy. RADIATION DOSE REDUCTION: This exam was performed according to the departmental dose-optimization program which includes automated exposure control, adjustment of the mA and/or kV according to patient size and/or use of iterative reconstruction technique. CONTRAST:  14mL OMNIPAQUE IOHEXOL 350 MG/ML SOLN COMPARISON:  CT chest 01/01/2022, PET CT 08/28/2021 FINDINGS: Cardiovascular: Satisfactory opacification of the pulmonary arteries to the segmental level. No evidence of pulmonary embolism. Normal heart size. No significant  pericardial effusion. The thoracic aorta is normal in caliber. Moderate atherosclerotic plaque of the thoracic aorta. Four-vessel coronary artery calcifications. Mediastinum/Nodes: Interval increase in size of a 17 mm (from 11 mm) right hilar lymph node. No enlarged mediastinal or axillary lymph nodes. Thyroid gland, trachea, and esophagus demonstrate no significant findings. LUngs/Pleura Biapical pleural/pulmonary scarring. Severe emphysematous changes. Left lower lobe enhancing airspace opacity with underlying associated trace left pleural effusion. Stable left lower lobe 6 mm pulmonary nodule. Interval increase in size of a nodular like 24 x 28 mm right posterior paravertebral airspace opacity adjacent to postsurgical changes (5:83). Stable 15 x 12 mm spiculated right lower lobe pulmonary nodule. Stable 6 mm right lower lobe pulmonary nodule (5:11). Stable 16 x 11 mm right lower lobe base pulmonary nodule (5:128). A 9 x 7 mm right lower lobe posterior base subpleural nodule (5:133)  likely present on prior study but difficult to visualize due to associated atelectasis. No pulmonary mass. No right pleural effusion. Few right pleural calcifications. No pneumothorax. Upper Abdomen: No acute abnormality. Tiny calcifications within the spleen and liver consistent with sequelae of prior granulomatous disease. Musculoskeletal: No chest wall abnormality. No suspicious lytic or blastic osseous lesions. No acute displaced fracture. Review of the MIP images confirms the above findings. IMPRESSION: 1. No pulmonary embolus. 2. Interval increase in size of a nodular like 24 x 28 mm right posterior paravertebral airspace opacity adjacent to postsurgical changes. Additional imaging evaluation or consultation with Pulmonology or Thoracic Surgery recommended. 3. Interval increase in size of a 17 mm (from 11 mm) right hilar lymph node. 4. Several stable pulmonary nodules within the right lung (including a 16 x 11 mm right lower lobe  nodule that was avid on PET CT) as well as a single stable pulmonary nodule within left lung. 5. Trace left pleural effusion with associated passive atelectasis. 6. Emphysema (ICD10-J43.9) - severe. 7. Aortic Atherosclerosis (ICD10-I70.0) including four-vessel coronary calcification. Electronically Signed   By: Iven Finn M.D.   On: 02/18/2022 00:54   DG Chest Port 1 View  Result Date: 02/17/2022 CLINICAL DATA:  Shortness of breath for 2 days.  Sepsis.  COPD. EXAM: PORTABLE CHEST 1 VIEW COMPARISON:  07/09/2021 FINDINGS: The heart size and mediastinal contours are within normal limits. Severe emphysema again noted. Stable pleural-parenchymal scarring in the right lung base. No evidence of superimposed pulmonary infiltrate or pleural effusion. IMPRESSION: Severe emphysema and right basilar scarring. No acute findings. Electronically Signed   By: Marlaine Hind M.D.   On: 02/17/2022 09:33    Microbiology: Results for orders placed or performed during the hospital encounter of 02/17/22  Culture, blood (routine x 2)     Status: None (Preliminary result)   Collection Time: 02/17/22  9:48 AM   Specimen: BLOOD  Result Value Ref Range Status   Specimen Description BLOOD BLOOD LEFT ARM  Final   Special Requests   Final    BOTTLES DRAWN AEROBIC AND ANAEROBIC Blood Culture adequate volume   Culture   Final    NO GROWTH 4 DAYS Performed at [facility], [address] [place], [place] [address]    Report Status PENDING  Incomplete  Culture, blood (routine x 2)     Status: None (Preliminary result)   Collection Time: 02/17/22  9:48 AM   Specimen: BLOOD LEFT ARM  Result Value Ref Range Status   Specimen Description BLOOD LEFT ARM  Final   Special Requests   Final    BOTTLES DRAWN AEROBIC AND ANAEROBIC Blood Culture results may not be optimal due to an excessive volume of blood received in culture bottles   Culture   Final    NO GROWTH 4 DAYS Performed at [facility], [address], [place], [place] [address]    Report Status PENDING  Incomplete  Resp panel by RT-PCR (RSV, Flu A&B, Covid) Anterior Nasal Swab     Status: Abnormal   Collection Time: 02/17/22 11:14 AM   Specimen: Anterior Nasal Swab  Result Value Ref Range Status   SARS Coronavirus 2 by RT PCR NEGATIVE NEGATIVE Final    Comment: (NOTE) SARS-CoV-2 target nucleic acids are NOT DETECTED.  The SARS-CoV-2 RNA is generally detectable in upper respiratory specimens during the acute phase of infection. The lowest concentration of SARS-CoV-2 viral copies this assay can detect is 138 copies/mL. A negative result does  not preclude SARS-Cov-2 infection and should not be used as the sole basis for treatment or other patient management decisions. A negative result may occur with  improper specimen collection/handling, submission of specimen other than nasopharyngeal swab, presence of viral mutation(s) within the areas targeted by this assay, and inadequate number of viral copies(<138 copies/mL). A negative result must be combined with clinical observations, patient history, and epidemiological information. The expected result is Negative.  Fact Sheet for Patients:  EntrepreneurPulse.com.au  Fact Sheet for Healthcare Providers:  IncredibleEmployment.be  This test is no t yet approved or cleared by the [place] FDA and  has been authorized for detection and/or diagnosis of SARS-CoV-2 by FDA under an Emergency Use Authorization (EUA). This EUA will remain  in effect (meaning this test can be used) for the duration of the COVID-19 declaration under Section 564(b)(1) of the Act, 21 U.S.C.section 360bbb-3(b)(1), unless the authorization is terminated  or revoked sooner.       Influenza A by PCR POSITIVE (A) NEGATIVE Final   Influenza B by PCR NEGATIVE NEGATIVE Final    Comment: (NOTE) The Xpert Xpress SARS-CoV-2/FLU/RSV plus assay is intended as an aid in  the diagnosis of influenza from Nasopharyngeal swab specimens and should not be used as a sole basis for treatment. Nasal washings and aspirates are unacceptable for Xpert Xpress SARS-CoV-2/FLU/RSV testing.  Fact Sheet for Patients: EntrepreneurPulse.com.au  Fact Sheet for Healthcare Providers: IncredibleEmployment.be  This test is not yet approved or cleared by the [place] FDA and has been authorized for detection and/or diagnosis of SARS-CoV-2 by FDA under an Emergency Use Authorization (EUA). This EUA will remain in effect (meaning this test can be used) for the duration of the COVID-19 declaration under Section 564(b)(1) of the Act, 21 U.S.C. section 360bbb-3(b)(1), unless the authorization is terminated or revoked.     Resp Syncytial Virus by PCR NEGATIVE NEGATIVE Final    Comment: (NOTE) Fact Sheet for Patients: EntrepreneurPulse.com.au  Fact Sheet for Healthcare Providers: IncredibleEmployment.be  This test is not yet approved or cleared by the [place] FDA and has been authorized for detection and/or diagnosis of SARS-CoV-2 by FDA under an Emergency Use Authorization (EUA). This EUA will remain in effect (meaning this test can be used) for the duration of the COVID-19 declaration under Section 564(b)(1) of the Act, 21 U.S.C. section 360bbb-3(b)(1), unless the authorization is terminated or revoked.  Performed at [facility], [address] [place], [place] [address]   Expectorated Sputum Assessment w Gram Stain, Rflx to Resp Cult     Status: None   Collection Time: 02/19/22 12:10 AM   Specimen: Expectorated Sputum  Result Value Ref Range Status   Specimen Description EXPECTORATED SPUTUM  Final   Special Requests NONE  Final   Sputum evaluation   Final    THIS SPECIMEN IS ACCEPTABLE FOR SPUTUM CULTURE Performed at [facility], [address], [place],  [place] [address]    Report Status 02/19/2022 FINAL  Final  Culture, Respiratory w Gram Stain     Status: None   Collection Time: 02/19/22 12:10 AM  Result Value Ref Range Status   Specimen Description   Final    EXPECTORATED SPUTUM Performed at [facility], [address], [place], [place] [address]    Special Requests   Final    NONE Reflexed from [id number] Performed at [facility], [address], [place], [place] [address]    Gram Stain   Final  FEW WBC PRESENT, PREDOMINANTLY PMN RARE GRAM POSITIVE COCCI IN PAIRS RARE GRAM NEGATIVE RODS    Culture   Final    FEW Normal respiratory flora-no Staph aureus or Pseudomonas seen Performed at [facility], [place] [address] [place], [place] [address]    Report Status 02/21/2022 FINAL  Final  C Difficile Quick Screen w PCR reflex     Status: None   Collection Time: 02/19/22  8:40 PM   Specimen: STOOL  Result Value Ref Range Status   C Diff antigen NEGATIVE NEGATIVE Final   C Diff toxin NEGATIVE NEGATIVE Final   C Diff interpretation No C. difficile detected.  Final    Comment: Performed at [facility], [address] [place], [place] [address]  Gastrointestinal Panel by PCR , Stool     Status: None   Collection Time: 02/19/22  8:40 PM   Specimen: Stool  Result Value Ref Range Status   Campylobacter species NOT DETECTED NOT DETECTED Final   Plesimonas shigelloides NOT DETECTED NOT DETECTED Final   Salmonella species NOT DETECTED NOT DETECTED Final   Yersinia enterocolitica NOT DETECTED NOT DETECTED Final   Vibrio species NOT DETECTED NOT DETECTED Final   Vibrio cholerae NOT DETECTED NOT DETECTED Final   Enteroaggregative E coli (EAEC) NOT DETECTED NOT DETECTED Final   Enteropathogenic E coli (EPEC) NOT DETECTED NOT DETECTED Final   Enterotoxigenic E coli (ETEC) NOT DETECTED NOT DETECTED Final   Shiga like toxin producing E coli (STEC) NOT DETECTED NOT DETECTED Final   Shigella/Enteroinvasive E coli  (EIEC) NOT DETECTED NOT DETECTED Final   Cryptosporidium NOT DETECTED NOT DETECTED Final   Cyclospora cayetanensis NOT DETECTED NOT DETECTED Final   Entamoeba histolytica NOT DETECTED NOT DETECTED Final   Giardia lamblia NOT DETECTED NOT DETECTED Final   Adenovirus F40/41 NOT DETECTED NOT DETECTED Final   Astrovirus NOT DETECTED NOT DETECTED Final   Norovirus GI/GII NOT DETECTED NOT DETECTED Final   Rotavirus A NOT DETECTED NOT DETECTED Final   Sapovirus (I, II, IV, and V) NOT DETECTED NOT DETECTED Final    Comment: Performed at [facility], [address] [place], [place] [address]    Labs: CBC: Recent Labs  Lab 02/17/22 0948 02/18/22 0426 02/20/22 0618  WBC 10.3 9.6 8.6  NEUTROABS 8.9*  --   --   HGB 16.2 13.3 13.7  HCT 51.4 41.9 42.9  MCV 91.6 89.3 89.0  PLT 177 180 256   Basic Metabolic Panel: Recent Labs  Lab 02/17/22 1114 02/18/22 0426 02/20/22 0618 02/21/22 0546  NA 138 137 138 138  K 4.1 4.0 4.1 4.7  CL 105 110 112* 109  CO2 19* 22 22 20*  GLUCOSE 227* 205* 196* 219*  BUN 17 13 21  28*  CREATININE 1.30* 1.04 1.00 1.05  CALCIUM 8.4* 8.5* 8.0* 8.4*  MG  --   --  2.1  --    Liver Function Tests: Recent Labs  Lab 02/17/22 1114  AST 26  ALT 19  ALKPHOS 62  BILITOT 1.0  PROT 7.1  ALBUMIN 3.5   CBG: Recent Labs  Lab 02/20/22 1703 02/20/22 1738 02/20/22 1953 02/21/22 0808 02/21/22 1205  GLUCAP 184* 236* 264* 192* 256*    Discharge time spent: greater than 30 minutes.  Signed: Jennye Boroughs, MD [facility] 02/21/2022

## 2022-02-22 LAB — CULTURE, BLOOD (ROUTINE X 2)
Culture: NO GROWTH
Culture: NO GROWTH
Special Requests: ADEQUATE

## 2022-02-26 ENCOUNTER — Ambulatory Visit: Payer: Medicare HMO

## 2022-02-28 ENCOUNTER — Other Ambulatory Visit: Payer: Self-pay

## 2022-02-28 ENCOUNTER — Ambulatory Visit: Payer: Medicare HMO

## 2022-02-28 ENCOUNTER — Ambulatory Visit
Admission: RE | Admit: 2022-02-28 | Discharge: 2022-02-28 | Disposition: A | Discharge status: Home / Self Care | Payer: Medicare HMO | Source: Ambulatory Visit | Attending: Radiation Oncology | Admitting: Radiation Oncology

## 2022-02-28 DIAGNOSIS — Z510 Encounter for antineoplastic radiation therapy: Secondary | ICD-10-CM | POA: Diagnosis not present

## 2022-02-28 LAB — RAD ONC ARIA SESSION SUMMARY
Course Elapsed Days: 14
Plan Fractions Treated to Date: 2
Plan Prescribed Dose Per Fraction: 12 Gy
Plan Total Fractions Prescribed: 5
Plan Total Prescribed Dose: 60 Gy
Reference Point Dosage Given to Date: 24 Gy
Reference Point Session Dosage Given: 12 Gy
Session Number: 2

## 2022-03-05 ENCOUNTER — Ambulatory Visit: Payer: Medicare HMO

## 2022-03-05 ENCOUNTER — Ambulatory Visit
Admission: RE | Admit: 2022-03-05 | Discharge: 2022-03-05 | Disposition: A | Discharge status: Home / Self Care | Payer: Medicare HMO | Source: Ambulatory Visit | Attending: Radiation Oncology | Admitting: Radiation Oncology

## 2022-03-05 ENCOUNTER — Other Ambulatory Visit: Payer: Self-pay

## 2022-03-05 DIAGNOSIS — Z510 Encounter for antineoplastic radiation therapy: Secondary | ICD-10-CM | POA: Insufficient documentation

## 2022-03-05 DIAGNOSIS — C3431 Malignant neoplasm of lower lobe, right bronchus or lung: Secondary | ICD-10-CM | POA: Diagnosis present

## 2022-03-05 LAB — RAD ONC ARIA SESSION SUMMARY
Course Elapsed Days: 19
Plan Fractions Treated to Date: 3
Plan Prescribed Dose Per Fraction: 12 Gy
Plan Total Fractions Prescribed: 5
Plan Total Prescribed Dose: 60 Gy
Reference Point Dosage Given to Date: 36 Gy
Reference Point Session Dosage Given: 12 Gy
Session Number: 3

## 2022-03-07 ENCOUNTER — Other Ambulatory Visit: Payer: Self-pay

## 2022-03-07 ENCOUNTER — Ambulatory Visit
Admission: RE | Admit: 2022-03-07 | Discharge: 2022-03-07 | Disposition: A | Discharge status: Home / Self Care | Payer: Medicare HMO | Source: Ambulatory Visit | Attending: Radiation Oncology | Admitting: Radiation Oncology

## 2022-03-07 ENCOUNTER — Ambulatory Visit: Payer: Medicare HMO

## 2022-03-07 DIAGNOSIS — Z510 Encounter for antineoplastic radiation therapy: Secondary | ICD-10-CM | POA: Diagnosis not present

## 2022-03-07 LAB — RAD ONC ARIA SESSION SUMMARY
Course Elapsed Days: 21
Plan Fractions Treated to Date: 4
Plan Prescribed Dose Per Fraction: 12 Gy
Plan Total Fractions Prescribed: 5
Plan Total Prescribed Dose: 60 Gy
Reference Point Dosage Given to Date: 48 Gy
Reference Point Session Dosage Given: 12 Gy
Session Number: 4

## 2022-03-12 ENCOUNTER — Other Ambulatory Visit: Payer: Self-pay

## 2022-03-12 ENCOUNTER — Ambulatory Visit
Admission: RE | Admit: 2022-03-12 | Discharge: 2022-03-12 | Disposition: A | Discharge status: Home / Self Care | Payer: Medicare HMO | Source: Ambulatory Visit | Attending: Radiation Oncology | Admitting: Radiation Oncology

## 2022-03-12 DIAGNOSIS — Z510 Encounter for antineoplastic radiation therapy: Secondary | ICD-10-CM | POA: Diagnosis not present

## 2022-03-12 LAB — RAD ONC ARIA SESSION SUMMARY
Course Elapsed Days: 26
Plan Fractions Treated to Date: 5
Plan Prescribed Dose Per Fraction: 12 Gy
Plan Total Fractions Prescribed: 5
Plan Total Prescribed Dose: 60 Gy
Reference Point Dosage Given to Date: 60 Gy
Reference Point Session Dosage Given: 12 Gy
Session Number: 5

## 2022-04-01 ENCOUNTER — Emergency Department: Payer: Medicare HMO

## 2022-04-01 ENCOUNTER — Other Ambulatory Visit: Payer: Self-pay

## 2022-04-01 ENCOUNTER — Inpatient Hospital Stay
Admission: EM | Admit: 2022-04-01 | Discharge: 2022-04-12 | DRG: 871 | Disposition: A | Discharge status: Skilled Nursing Facility | Payer: Medicare HMO | Attending: Family Medicine | Admitting: Family Medicine

## 2022-04-01 DIAGNOSIS — F419 Anxiety disorder, unspecified: Secondary | ICD-10-CM | POA: Diagnosis present

## 2022-04-01 DIAGNOSIS — J189 Pneumonia, unspecified organism: Secondary | ICD-10-CM | POA: Diagnosis not present

## 2022-04-01 DIAGNOSIS — I129 Hypertensive chronic kidney disease with stage 1 through stage 4 chronic kidney disease, or unspecified chronic kidney disease: Secondary | ICD-10-CM | POA: Diagnosis present

## 2022-04-01 DIAGNOSIS — A419 Sepsis, unspecified organism: Principal | ICD-10-CM | POA: Diagnosis present

## 2022-04-01 DIAGNOSIS — R6520 Severe sepsis without septic shock: Secondary | ICD-10-CM | POA: Diagnosis present

## 2022-04-01 DIAGNOSIS — N1831 Chronic kidney disease, stage 3a: Secondary | ICD-10-CM | POA: Diagnosis present

## 2022-04-01 DIAGNOSIS — E440 Moderate protein-calorie malnutrition: Secondary | ICD-10-CM | POA: Diagnosis present

## 2022-04-01 DIAGNOSIS — Z7982 Long term (current) use of aspirin: Secondary | ICD-10-CM

## 2022-04-01 DIAGNOSIS — I2510 Atherosclerotic heart disease of native coronary artery without angina pectoris: Secondary | ICD-10-CM | POA: Diagnosis present

## 2022-04-01 DIAGNOSIS — E1151 Type 2 diabetes mellitus with diabetic peripheral angiopathy without gangrene: Secondary | ICD-10-CM | POA: Diagnosis present

## 2022-04-01 DIAGNOSIS — Z751 Person awaiting admission to adequate facility elsewhere: Secondary | ICD-10-CM

## 2022-04-01 DIAGNOSIS — I10 Essential (primary) hypertension: Secondary | ICD-10-CM | POA: Diagnosis present

## 2022-04-01 DIAGNOSIS — Z79899 Other long term (current) drug therapy: Secondary | ICD-10-CM

## 2022-04-01 DIAGNOSIS — E1165 Type 2 diabetes mellitus with hyperglycemia: Secondary | ICD-10-CM | POA: Diagnosis present

## 2022-04-01 DIAGNOSIS — G4733 Obstructive sleep apnea (adult) (pediatric): Secondary | ICD-10-CM | POA: Diagnosis present

## 2022-04-01 DIAGNOSIS — E785 Hyperlipidemia, unspecified: Secondary | ICD-10-CM | POA: Diagnosis present

## 2022-04-01 DIAGNOSIS — F32A Depression, unspecified: Secondary | ICD-10-CM | POA: Diagnosis present

## 2022-04-01 DIAGNOSIS — E872 Acidosis, unspecified: Secondary | ICD-10-CM | POA: Diagnosis present

## 2022-04-01 DIAGNOSIS — G4730 Sleep apnea, unspecified: Secondary | ICD-10-CM | POA: Diagnosis present

## 2022-04-01 DIAGNOSIS — Z1152 Encounter for screening for COVID-19: Secondary | ICD-10-CM

## 2022-04-01 DIAGNOSIS — C3490 Malignant neoplasm of unspecified part of unspecified bronchus or lung: Secondary | ICD-10-CM | POA: Diagnosis present

## 2022-04-01 DIAGNOSIS — Z7984 Long term (current) use of oral hypoglycemic drugs: Secondary | ICD-10-CM

## 2022-04-01 DIAGNOSIS — Z87891 Personal history of nicotine dependence: Secondary | ICD-10-CM

## 2022-04-01 DIAGNOSIS — E1122 Type 2 diabetes mellitus with diabetic chronic kidney disease: Secondary | ICD-10-CM | POA: Diagnosis present

## 2022-04-01 DIAGNOSIS — Z8616 Personal history of COVID-19: Secondary | ICD-10-CM

## 2022-04-01 DIAGNOSIS — J9621 Acute and chronic respiratory failure with hypoxia: Principal | ICD-10-CM | POA: Diagnosis present

## 2022-04-01 DIAGNOSIS — Z888 Allergy status to other drugs, medicaments and biological substances status: Secondary | ICD-10-CM

## 2022-04-01 DIAGNOSIS — J440 Chronic obstructive pulmonary disease with acute lower respiratory infection: Secondary | ICD-10-CM | POA: Diagnosis present

## 2022-04-01 DIAGNOSIS — J439 Emphysema, unspecified: Secondary | ICD-10-CM | POA: Diagnosis present

## 2022-04-01 DIAGNOSIS — Z6824 Body mass index (BMI) 24.0-24.9, adult: Secondary | ICD-10-CM

## 2022-04-01 DIAGNOSIS — Z8249 Family history of ischemic heart disease and other diseases of the circulatory system: Secondary | ICD-10-CM

## 2022-04-01 LAB — CBC WITH DIFFERENTIAL/PLATELET
Abs Immature Granulocytes: 0.07 10*3/uL (ref 0.00–0.07)
Basophils Absolute: 0.1 10*3/uL (ref 0.0–0.1)
Basophils Relative: 1 %
Eosinophils Absolute: 0.3 10*3/uL (ref 0.0–0.5)
Eosinophils Relative: 2 %
HCT: 44.1 % (ref 39.0–52.0)
Hemoglobin: 13.7 g/dL (ref 13.0–17.0)
Immature Granulocytes: 1 %
Lymphocytes Relative: 8 %
Lymphs Abs: 1.2 10*3/uL (ref 0.7–4.0)
MCH: 28.2 pg (ref 26.0–34.0)
MCHC: 31.1 g/dL (ref 30.0–36.0)
MCV: 90.7 fL (ref 80.0–100.0)
Monocytes Absolute: 1.5 10*3/uL — ABNORMAL HIGH (ref 0.1–1.0)
Monocytes Relative: 10 %
Neutro Abs: 12.1 10*3/uL — ABNORMAL HIGH (ref 1.7–7.7)
Neutrophils Relative %: 78 %
Platelets: 226 10*3/uL (ref 150–400)
RBC: 4.86 MIL/uL (ref 4.22–5.81)
RDW: 15.4 % (ref 11.5–15.5)
WBC: 15.2 10*3/uL — ABNORMAL HIGH (ref 4.0–10.5)
nRBC: 0 % (ref 0.0–0.2)

## 2022-04-01 LAB — COMPREHENSIVE METABOLIC PANEL
ALT: 15 U/L (ref 0–44)
AST: 22 U/L (ref 15–41)
Albumin: 3.2 g/dL — ABNORMAL LOW (ref 3.5–5.0)
Alkaline Phosphatase: 67 U/L (ref 38–126)
Anion gap: 11 (ref 5–15)
BUN: 16 mg/dL (ref 8–23)
CO2: 24 mmol/L (ref 22–32)
Calcium: 9 mg/dL (ref 8.9–10.3)
Chloride: 103 mmol/L (ref 98–111)
Creatinine, Ser: 1.01 mg/dL (ref 0.61–1.24)
GFR, Estimated: >60 mL/min (ref >60)
Glucose, Bld: 178 mg/dL — ABNORMAL HIGH (ref 70–99)
Potassium: 4.1 mmol/L (ref 3.5–5.1)
Sodium: 138 mmol/L (ref 135–145)
Total Bilirubin: 1.1 mg/dL (ref 0.3–1.2)
Total Protein: 7.1 g/dL (ref 6.5–8.1)

## 2022-04-01 LAB — RESP PANEL BY RT-PCR (RSV, FLU A&B, COVID)  RVPGX2
Influenza A by PCR: NEGATIVE
Influenza B by PCR: NEGATIVE
Resp Syncytial Virus by PCR: NEGATIVE
SARS Coronavirus 2 by RT PCR: NEGATIVE

## 2022-04-01 LAB — TROPONIN I (HIGH SENSITIVITY): Troponin I (High Sensitivity): 8 ng/L (ref <18)

## 2022-04-01 LAB — BLOOD GAS, VENOUS

## 2022-04-01 LAB — BRAIN NATRIURETIC PEPTIDE: B Natriuretic Peptide: 68.3 pg/mL (ref 0.0–100.0)

## 2022-04-01 LAB — LACTIC ACID, PLASMA: Lactic Acid, Venous: 2.5 mmol/L (ref 0.5–1.9)

## 2022-04-01 LAB — LIPASE, BLOOD: Lipase: 41 U/L (ref 11–51)

## 2022-04-01 LAB — PROCALCITONIN: Procalcitonin: <0.1 ng/mL

## 2022-04-01 MED ORDER — IOHEXOL 300 MG/ML  SOLN
100.0000 mL | Freq: Once | INTRAMUSCULAR | Status: AC | PRN
  Administered 2022-04-01: 100 mL via INTRAVENOUS

## 2022-04-01 MED ORDER — SODIUM CHLORIDE 0.9 % IV BOLUS
1000.0000 mL | Freq: Once | INTRAVENOUS | Status: AC
  Administered 2022-04-01: 1000 mL via INTRAVENOUS

## 2022-04-01 MED ORDER — SODIUM CHLORIDE 0.9 % IV SOLN
2.0000 g | Freq: Once | INTRAVENOUS | Status: AC
  Administered 2022-04-01: 2 g via INTRAVENOUS
  Filled 2022-04-01: qty 12.5

## 2022-04-01 MED ORDER — LACTATED RINGERS IV SOLN: INTRAVENOUS | Status: DC

## 2022-04-01 MED ORDER — SODIUM CHLORIDE 0.9 % IV SOLN
500.0000 mg | Freq: Once | INTRAVENOUS | Status: AC
  Administered 2022-04-01: 500 mg via INTRAVENOUS
  Filled 2022-04-01: qty 5

## 2022-04-01 NOTE — ED Notes (Signed)
Per RT, pt placed on HFNC 30/30.  Current O2 sats 95.

## 2022-04-01 NOTE — ED Notes (Signed)
Lactic 2.5 per lab, EDP McHugh verbally notified at this time.

## 2022-04-01 NOTE — ED Provider Notes (Signed)
[facility] Provider Note    Event Date/Time   First MD Initiated Contact with Patient 04/01/22 2206     (approximate)   History   Respiratory Distress   HPI  Bradley [name] is a 72 y.o. male past medical history of COPD, non-small cell lung cancer on 5 L nasal cannula at baseline, diabetes hypertension hyperlipidemia who presents with respiratory distress.  Patient says he has been feeling short of breath about the last 3 days.  Has had cough productive of brown sputum.  Denies chest pain fevers chills.  Started having vomiting today.  Does have some mild abdominal pain in the upper abdomen.  Denies lower extremity swelling no history of DVT/PE is not anticoagulated.  Patient was admitted last month for severe sepsis secondary to influenza A and COPD exacerbation.    Past Medical History:  Diagnosis Date   Alcohol use disorder    Anemia    Anxiety    COPD (chronic obstructive pulmonary disease) (HCC)    Coronary artery disease    Depression    Diabetes mellitus without complication ([place])    Family history of adverse reaction to anesthesia    daughter-spinal headache afer epidural   History of tobacco abuse    Hyperlipidemia    Hypertension    Hyponatremia    Left carotid artery stenosis    Lung mass    Malnutrition (HCC)    Nausea vomiting and diarrhea 02/17/2022   PAD (peripheral artery disease) (HCC)    Pneumonia    Sleep apnea    does not use cpap   Spinal headache    after lung surgery   Stage 2 chronic kidney disease    Tachycardia     Patient Active Problem List   Diagnosis Date Noted   Type II diabetes mellitus with renal manifestations ([place]) 02/17/2022   Chronic kidney disease, stage 3a ([place]) 02/17/2022   CAD (coronary artery disease) 02/17/2022   Non-small cell lung cancer (NSCLC) ([place]) 02/17/2022   Nausea vomiting and diarrhea 02/17/2022   Alcohol abuse 02/17/2022   Malnutrition of moderate degree 07/11/2021   Sepsis due to  pneumonia ([place]) 07/10/2021   Type 2 diabetes mellitus without complications ([place]) 17/40/8144   Dyslipidemia 07/10/2021   Essential hypertension 07/10/2021   Multifocal pneumonia 03/02/2021   Sepsis ([place]) 02/05/2021   History of tobacco use disorder 02/05/2021   Alcohol use disorder, moderate, dependence ([place]) 02/05/2021   Acute on chronic respiratory failure ([place]) 07/30/2020   CKD (chronic kidney disease) stage 3, GFR 30-59 ml/min ([place]) 07/29/2020   Hypertension associated with diabetes ([place]) 07/29/2020   Hyperlipidemia associated with type 2 diabetes mellitus ([place]) 07/29/2020   COVID-19 virus infection 07/29/2020   Callus 07/13/2020   Pain due to onychomycosis of toenails of both feet 01/06/2020   Type 2 diabetes mellitus with vascular disease ([place]) 01/06/2020   Chronic renal failure, stage 2 (mild) 12/01/2019   Lightheadedness 11/16/2019   Acute kidney injury ([place]) 04/20/2019   PAD (peripheral artery disease) ([place]) 04/20/2019   Tachycardia 04/20/2019   B12 deficiency 12/07/2017   OSA (obstructive sleep apnea) 11/28/2017   Lower extremity pain, left 03/06/2017   Numbness and tingling of left leg 03/06/2017   Coronary artery disease involving native coronary artery of native heart without angina pectoris 02/18/2017   Diastasis recti 02/18/2017   CVD (cardiovascular disease) 02/18/2017   Need for vaccination 81/85/6314   Umbilical hernia without obstruction and without gangrene 02/18/2017   Hemoglobinuria 08/21/2016  Anemia 08/07/2016   Pneumonia 07/15/2016   Lung mass 06/28/2016   History of cigarette smoking 06/19/2016   Screening for malignant neoplasm of respiratory organ 06/19/2016   Acute respiratory failure with hypoxia ([place]) 12/27/2015   Community acquired pneumonia 12/27/2015   Acute respiratory distress 09/19/2015   Adjustment disorder with mixed anxiety and depressed mood 04/14/2015   Insomnia 04/14/2015   Grief 04/14/2015   Hyponatremia 04/12/2015   CAP  (community acquired pneumonia) 10/08/2014   Hypogammaglobulinemia ([place]) 08/17/2014   Leukocytosis 08/03/2014   Recurrent infections 08/03/2014   Tinnitus of right ear 03/03/2014   Left carotid artery stenosis 07/21/2013   HLD (hyperlipidemia) 10/05/2010   Onychomycosis 10/05/2010     Physical Exam  Triage Vital Signs: ED Triage Vitals  Enc Vitals Group     BP --      Pulse Rate 04/01/22 2205 (!) 110     Resp 04/01/22 2205 19     Temp 04/01/22 2206 98.4 F (36.9 C)     Temp Source 04/01/22 2206 Oral     SpO2 04/01/22 2205 98 %     Weight 04/01/22 2206 158 lb 15.2 oz (72.1 kg)     Height 04/01/22 2206 5\' 7"  (1.702 m)     Head Circumference --      Peak Flow --      Pain Score 04/01/22 2205 0     Pain Loc --      Pain Edu? --      Excl. in [place]? --     Most recent vital signs: Vitals:   04/01/22 2206 04/01/22 2215  BP:  (!) 116/59  Pulse:    Resp:    Temp: 98.4 F (36.9 C)   SpO2:  90%     General: Awake, no distress.  Chronically ill-appearing CV:  Good peripheral perfusion. No edema Resp:  Normal effort.  Lung sounds are diminished but equal and there is no overt wheezing Abd:  No distention.  Neuro:             Awake, Alert, Oriented x 3  Other:     ED Results / Procedures / Treatments  Labs (all labs ordered are listed, but only abnormal results are displayed) Labs Reviewed  CBC WITH DIFFERENTIAL/PLATELET - Abnormal; Notable for the following components:      Result Value   WBC 15.2 (*)    Neutro Abs 12.1 (*)    Monocytes Absolute 1.5 (*)    All other components within normal limits  BLOOD GAS, VENOUS - Abnormal; Notable for the following components:   Acid-Base Excess 2.5 (*)    All other components within normal limits  CULTURE, BLOOD (ROUTINE X 2)  CULTURE, BLOOD (ROUTINE X 2)  RESP PANEL BY RT-PCR (RSV, FLU A&B, COVID)  RVPGX2  COMPREHENSIVE METABOLIC PANEL  BRAIN NATRIURETIC PEPTIDE  LACTIC ACID, PLASMA  LACTIC ACID, PLASMA  LIPASE, BLOOD   PROCALCITONIN  TROPONIN I (HIGH SENSITIVITY)     EKG  EKG reviewed and interpreted by myself shows sinus tachycardia with a right bundle branch block and left anterior fascicular block no acute ischemic changes   RADIOLOGY Chest x-ray reviewed inter myself shows infiltrate in right lower lobe   PROCEDURES:  Critical Care performed: Yes, see critical care procedure note(s)  .Critical Care  Performed by: Rada Hay, MD Authorized by: Rada Hay, MD   Critical care provider statement:    Critical care time (minutes):  30   Critical care was  time spent personally by me on the following activities:  Development of treatment plan with patient or surrogate, discussions with consultants, evaluation of patient's response to treatment, examination of patient, ordering and review of laboratory studies, ordering and review of radiographic studies, ordering and performing treatments and interventions, pulse oximetry, re-evaluation of patient's condition and review of old charts   The patient is on the cardiac monitor to evaluate for evidence of arrhythmia and/or significant heart rate changes.   MEDICATIONS ORDERED IN ED: Medications  ceFEPIme (MAXIPIME) 2 g in sodium chloride 0.9 % 100 mL IVPB (has no administration in time range)  azithromycin (ZITHROMAX) 500 mg in sodium chloride 0.9 % 250 mL IVPB (has no administration in time range)  sodium chloride 0.9 % bolus 1,000 mL (has no administration in time range)  lactated ringers infusion (has no administration in time range)     IMPRESSION / MDM / ASSESSMENT AND PLAN / ED COURSE  I reviewed the triage vital signs and the nursing notes.                              Patient's presentation is most consistent with acute presentation with potential threat to life or bodily function.  Differential diagnosis includes, but is not limited to, COPD exacerbation, pneumonia, PE, pneumothorax, pleural effusion The patient is a  72 year old male with severe COPD and non-small cell lung cancer who presents with shortness of breath for the last 3 days as well as cough.  Per EMS he was satting in the 70s on his baseline 5 L nasal cannula temp was 100.4 for EMS.  He was given Solu-Medrol and DuoNebs.  On arrival to ED patient is able to speak in full sentences but does have tachypnea he is satting about 90% on a neb treatment.  Given he had vomiting and work of breathing is okay at the moment will place on high flow.  Does tell me he has had productive cough with brown sputum over the last 2 days and some vomiting today.  With his vitals and history and concern for possible infectious process.  Will check lactate blood cultures labs chest x-ray.  He has already received Solu-Medrol.  Chest x-ray looks like a right lower lobe pneumonia.  Will cover with cefepime azithromycin.  Given patient's abdominal pain will get CT abdomen pelvis.  He is currently on high flow at 40 L 45% FiO2 satting in low 90s.  He looks comfortable.      FINAL CLINICAL IMPRESSION(S) / ED DIAGNOSES   Final diagnoses:  Acute on chronic respiratory failure with hypoxia ([place])  Community acquired pneumonia of right lower lobe of lung     Rx / DC Orders   ED Discharge Orders     None        Note:  This document was prepared using Dragon voice recognition software and may include unintentional dictation errors.   Rada Hay, MD 04/01/22 2251

## 2022-04-01 NOTE — Sepsis Progress Note (Signed)
Elink monitoring for the code sepsis protocol.  

## 2022-04-01 NOTE — Consult Note (Signed)
CODE SEPSIS - PHARMACY COMMUNICATION  **Broad Spectrum Antibiotics should be administered within 1 hour of Sepsis diagnosis**  Time Code Sepsis Called/Page Received: 2251  Antibiotics Ordered: cefepime azithromycin  Time of 1st antibiotic administration: 2304  Additional action taken by pharmacy: n/a  If necessary, Name of Provider/Nurse Contacted: n/a    Dorothe Pea ,PharmD Clinical Pharmacist  04/01/2022  10:55 PM

## 2022-04-01 NOTE — ED Triage Notes (Signed)
Pt arrives from home via [facility]. HX of COPD and lung cancer. 5LNC at baseline.C/O d/t resp distress. O2 sats were 74% when EMS arrived. Pt reports N/V, cough, brown mucous and urine.  Febrile at 100.4 ax. 3 duonebs, 125 solumedrol and 4 zofran given by EMS.

## 2022-04-02 DIAGNOSIS — I10 Essential (primary) hypertension: Secondary | ICD-10-CM | POA: Diagnosis not present

## 2022-04-02 DIAGNOSIS — C3490 Malignant neoplasm of unspecified part of unspecified bronchus or lung: Secondary | ICD-10-CM | POA: Diagnosis present

## 2022-04-02 DIAGNOSIS — E1165 Type 2 diabetes mellitus with hyperglycemia: Secondary | ICD-10-CM | POA: Diagnosis present

## 2022-04-02 DIAGNOSIS — E440 Moderate protein-calorie malnutrition: Secondary | ICD-10-CM | POA: Diagnosis present

## 2022-04-02 DIAGNOSIS — C3491 Malignant neoplasm of unspecified part of right bronchus or lung: Secondary | ICD-10-CM | POA: Diagnosis not present

## 2022-04-02 DIAGNOSIS — F32A Depression, unspecified: Secondary | ICD-10-CM | POA: Diagnosis present

## 2022-04-02 DIAGNOSIS — J440 Chronic obstructive pulmonary disease with acute lower respiratory infection: Secondary | ICD-10-CM | POA: Diagnosis present

## 2022-04-02 DIAGNOSIS — J189 Pneumonia, unspecified organism: Secondary | ICD-10-CM | POA: Diagnosis present

## 2022-04-02 DIAGNOSIS — G4733 Obstructive sleep apnea (adult) (pediatric): Secondary | ICD-10-CM | POA: Diagnosis present

## 2022-04-02 DIAGNOSIS — E1122 Type 2 diabetes mellitus with diabetic chronic kidney disease: Secondary | ICD-10-CM | POA: Diagnosis present

## 2022-04-02 DIAGNOSIS — A419 Sepsis, unspecified organism: Secondary | ICD-10-CM

## 2022-04-02 DIAGNOSIS — I129 Hypertensive chronic kidney disease with stage 1 through stage 4 chronic kidney disease, or unspecified chronic kidney disease: Secondary | ICD-10-CM | POA: Diagnosis present

## 2022-04-02 DIAGNOSIS — Z1152 Encounter for screening for COVID-19: Secondary | ICD-10-CM | POA: Diagnosis not present

## 2022-04-02 DIAGNOSIS — J9621 Acute and chronic respiratory failure with hypoxia: Secondary | ICD-10-CM

## 2022-04-02 DIAGNOSIS — J439 Emphysema, unspecified: Secondary | ICD-10-CM | POA: Diagnosis present

## 2022-04-02 DIAGNOSIS — N1831 Chronic kidney disease, stage 3a: Secondary | ICD-10-CM | POA: Diagnosis present

## 2022-04-02 DIAGNOSIS — R6520 Severe sepsis without septic shock: Secondary | ICD-10-CM | POA: Diagnosis present

## 2022-04-02 DIAGNOSIS — Z87891 Personal history of nicotine dependence: Secondary | ICD-10-CM | POA: Diagnosis not present

## 2022-04-02 DIAGNOSIS — E1151 Type 2 diabetes mellitus with diabetic peripheral angiopathy without gangrene: Secondary | ICD-10-CM | POA: Diagnosis present

## 2022-04-02 DIAGNOSIS — Z79899 Other long term (current) drug therapy: Secondary | ICD-10-CM | POA: Diagnosis not present

## 2022-04-02 DIAGNOSIS — F419 Anxiety disorder, unspecified: Secondary | ICD-10-CM | POA: Diagnosis present

## 2022-04-02 DIAGNOSIS — E785 Hyperlipidemia, unspecified: Secondary | ICD-10-CM | POA: Diagnosis present

## 2022-04-02 DIAGNOSIS — Z8616 Personal history of COVID-19: Secondary | ICD-10-CM | POA: Diagnosis not present

## 2022-04-02 DIAGNOSIS — I2510 Atherosclerotic heart disease of native coronary artery without angina pectoris: Secondary | ICD-10-CM | POA: Diagnosis present

## 2022-04-02 DIAGNOSIS — E872 Acidosis, unspecified: Secondary | ICD-10-CM

## 2022-04-02 DIAGNOSIS — G4730 Sleep apnea, unspecified: Secondary | ICD-10-CM | POA: Diagnosis present

## 2022-04-02 LAB — GLUCOSE, CAPILLARY: Glucose-Capillary: 196 mg/dL — ABNORMAL HIGH (ref 70–99)

## 2022-04-02 LAB — TROPONIN I (HIGH SENSITIVITY): Troponin I (High Sensitivity): 10 ng/L (ref <18)

## 2022-04-02 LAB — PROTIME-INR
INR: 1.1 (ref 0.8–1.2)
Prothrombin Time: 14.1 seconds (ref 11.4–15.2)

## 2022-04-02 LAB — BASIC METABOLIC PANEL
Anion gap: 14 (ref 5–15)
BUN: 13 mg/dL (ref 8–23)
CO2: 18 mmol/L — ABNORMAL LOW (ref 22–32)
Calcium: 8.6 mg/dL — ABNORMAL LOW (ref 8.9–10.3)
Chloride: 105 mmol/L (ref 98–111)
Creatinine, Ser: 0.93 mg/dL (ref 0.61–1.24)
GFR, Estimated: >60 mL/min (ref >60)
Glucose, Bld: 235 mg/dL — ABNORMAL HIGH (ref 70–99)
Potassium: 4.1 mmol/L (ref 3.5–5.1)
Sodium: 137 mmol/L (ref 135–145)

## 2022-04-02 LAB — CBG MONITORING, ED
Glucose-Capillary: 224 mg/dL — ABNORMAL HIGH (ref 70–99)
Glucose-Capillary: 240 mg/dL — ABNORMAL HIGH (ref 70–99)

## 2022-04-02 LAB — HEMOGLOBIN A1C
Hgb A1c MFr Bld: 7.5 % — ABNORMAL HIGH (ref 4.8–5.6)
Mean Plasma Glucose: 168.55 mg/dL

## 2022-04-02 LAB — APTT: aPTT: 31 seconds (ref 24–36)

## 2022-04-02 LAB — LACTIC ACID, PLASMA: Lactic Acid, Venous: 2.1 mmol/L (ref 0.5–1.9)

## 2022-04-02 MED ORDER — SODIUM CHLORIDE 0.9% FLUSH
3.0000 mL | Freq: Two times a day (BID) | INTRAVENOUS | Status: DC
  Administered 2022-04-02 – 2022-04-12 (×19): 3 mL via INTRAVENOUS

## 2022-04-02 MED ORDER — PREDNISONE 50 MG PO TABS
50.0000 mg | ORAL_TABLET | Freq: Every day | ORAL | Status: AC
  Administered 2022-04-02 – 2022-04-06 (×5): 50 mg via ORAL
  Filled 2022-04-02 (×5): qty 1

## 2022-04-02 MED ORDER — DEXTROSE 5 % IV SOLN
250.0000 mg | INTRAVENOUS | Status: AC
  Administered 2022-04-02 – 2022-04-03 (×2): 250 mg via INTRAVENOUS
  Filled 2022-04-02 (×3): qty 2.5

## 2022-04-02 MED ORDER — FAMOTIDINE 20 MG PO TABS
20.0000 mg | ORAL_TABLET | Freq: Every day | ORAL | Status: DC
  Administered 2022-04-02 – 2022-04-12 (×11): 20 mg via ORAL
  Filled 2022-04-02 (×11): qty 1

## 2022-04-02 MED ORDER — ASPIRIN 81 MG PO TBEC
81.0000 mg | DELAYED_RELEASE_TABLET | Freq: Every evening | ORAL | Status: DC
  Administered 2022-04-02 – 2022-04-11 (×10): 81 mg via ORAL
  Filled 2022-04-02 (×10): qty 1

## 2022-04-02 MED ORDER — SODIUM CHLORIDE 0.9 % IV SOLN: 2.0000 g | INTRAVENOUS | Status: DC

## 2022-04-02 MED ORDER — SODIUM CHLORIDE 0.9 % IV SOLN
2.0000 g | INTRAVENOUS | Status: AC
  Administered 2022-04-02 – 2022-04-08 (×7): 2 g via INTRAVENOUS
  Filled 2022-04-02 (×3): qty 20
  Filled 2022-04-02: qty 2
  Filled 2022-04-02 (×3): qty 20

## 2022-04-02 MED ORDER — INSULIN ASPART 100 UNIT/ML IJ SOLN
0.0000 [IU] | Freq: Every day | INTRAMUSCULAR | Status: DC
  Administered 2022-04-03: 2 [IU] via SUBCUTANEOUS
  Filled 2022-04-02: qty 1

## 2022-04-02 MED ORDER — IPRATROPIUM-ALBUTEROL 0.5-2.5 (3) MG/3ML IN SOLN
3.0000 mL | Freq: Four times a day (QID) | RESPIRATORY_TRACT | Status: DC
  Administered 2022-04-02 – 2022-04-12 (×40): 3 mL via RESPIRATORY_TRACT
  Filled 2022-04-02 (×41): qty 3

## 2022-04-02 MED ORDER — ACETAMINOPHEN 650 MG RE SUPP: 650.0000 mg | Freq: Four times a day (QID) | RECTAL | Status: DC | PRN

## 2022-04-02 MED ORDER — INSULIN ASPART 100 UNIT/ML IJ SOLN
0.0000 [IU] | Freq: Three times a day (TID) | INTRAMUSCULAR | Status: DC
  Administered 2022-04-02 (×2): 5 [IU] via SUBCUTANEOUS
  Administered 2022-04-03: 11 [IU] via SUBCUTANEOUS
  Administered 2022-04-03 (×2): 5 [IU] via SUBCUTANEOUS
  Administered 2022-04-04: 3 [IU] via SUBCUTANEOUS
  Administered 2022-04-04: 5 [IU] via SUBCUTANEOUS
  Administered 2022-04-04: 8 [IU] via SUBCUTANEOUS
  Administered 2022-04-05 (×2): 5 [IU] via SUBCUTANEOUS
  Administered 2022-04-05: 8 [IU] via SUBCUTANEOUS
  Filled 2022-04-02 (×11): qty 1

## 2022-04-02 MED ORDER — ACETAMINOPHEN 325 MG PO TABS
650.0000 mg | ORAL_TABLET | Freq: Four times a day (QID) | ORAL | Status: DC | PRN
  Administered 2022-04-03 – 2022-04-06 (×5): 650 mg via ORAL
  Filled 2022-04-02 (×5): qty 2

## 2022-04-02 MED ORDER — ALBUTEROL SULFATE (2.5 MG/3ML) 0.083% IN NEBU
3.0000 mL | INHALATION_SOLUTION | Freq: Four times a day (QID) | RESPIRATORY_TRACT | Status: DC | PRN
  Administered 2022-04-11: 3 mL via RESPIRATORY_TRACT
  Filled 2022-04-02: qty 3

## 2022-04-02 MED ORDER — MOMETASONE FURO-FORMOTEROL FUM 200-5 MCG/ACT IN AERO
2.0000 | INHALATION_SPRAY | Freq: Two times a day (BID) | RESPIRATORY_TRACT | Status: DC
  Administered 2022-04-02 – 2022-04-12 (×19): 2 via RESPIRATORY_TRACT
  Filled 2022-04-02: qty 8.8

## 2022-04-02 MED ORDER — MONTELUKAST SODIUM 10 MG PO TABS
10.0000 mg | ORAL_TABLET | Freq: Every day | ORAL | Status: DC
  Administered 2022-04-02 – 2022-04-11 (×10): 10 mg via ORAL
  Filled 2022-04-02 (×10): qty 1

## 2022-04-02 MED ORDER — ROSUVASTATIN CALCIUM 10 MG PO TABS
40.0000 mg | ORAL_TABLET | ORAL | Status: DC
  Administered 2022-04-02 – 2022-04-12 (×11): 40 mg via ORAL
  Filled 2022-04-02 (×9): qty 4
  Filled 2022-04-02: qty 2
  Filled 2022-04-02: qty 4

## 2022-04-02 MED ORDER — BRIMONIDINE TARTRATE 0.2 % OP SOLN
1.0000 [drp] | Freq: Two times a day (BID) | OPHTHALMIC | Status: DC
  Administered 2022-04-02 – 2022-04-12 (×20): 1 [drp] via OPHTHALMIC
  Filled 2022-04-02 (×2): qty 5

## 2022-04-02 MED ORDER — SODIUM CHLORIDE 0.9 % IV SOLN: 500.0000 mg | INTRAVENOUS | Status: DC

## 2022-04-02 MED ORDER — ENOXAPARIN SODIUM 40 MG/0.4ML IJ SOSY
40.0000 mg | PREFILLED_SYRINGE | INTRAMUSCULAR | Status: DC
  Administered 2022-04-02 – 2022-04-11 (×10): 40 mg via SUBCUTANEOUS
  Filled 2022-04-02 (×10): qty 0.4

## 2022-04-02 NOTE — Assessment & Plan Note (Signed)
Trending down, I believe lactic hypoxia was also a major contributor to patient's lactic acidosis.  I want to be careful with the fluids given patient's marked hypoxia.  Trend

## 2022-04-02 NOTE — Assessment & Plan Note (Signed)
Continue with high flow nasal oxygen.  Treat the patient in the progressive care unit.  This is likely due to pneumonia.

## 2022-04-02 NOTE — Assessment & Plan Note (Signed)
Patient received 2 L of saline bolus as well as further some fluid as continuous infusion.  At this time lactic acidosis is trending down patient does not look toxic.  We will trend lactic acid and if trending down plan on holding off on further IV fluids

## 2022-04-02 NOTE — H&P (Addendum)
History and Physical    Patient: Bradley [name] [id number]:[id number] DOB: [birth year] DOA: 04/01/2022 DOS: the patient was seen and examined on 04/02/2022 PCP: Valera Castle, MD  Patient coming from: Home  Chief Complaint:  Chief Complaint  Patient presents with   Respiratory Distress   HPI: Bradley [name] is a 72 y.o. male with medical history significant of COPD with baseline use of 4 L/min of supplemental oxygen at home.  In spite of this patient does report that activities of daily living do cause him some dyspnea.  However over the last 3 days patient states that he has had a new onset of cough with production of brown sputum associated with worsening dyspnea.  Today he said that he was short of breath at rest and feeling nauseous and threw up about 3 times.  Nonbloody.  Patient reports a temperature of 100 F at home.  There is no chest pain no palpitations.  No diarrhea no abdominal pain.  Patient came to the ER because of severe shortness of breath  Patient is s/p oxygen supplementation in the ER at 40 L/min at 40% FiO2.  Patient is "feeling better".  No leg swelling or cramping is reported Review of Systems: As mentioned in the history of present illness. All other systems reviewed and are negative. Past Medical History:  Diagnosis Date   Alcohol use disorder    Anemia    Anxiety    COPD (chronic obstructive pulmonary disease) (HCC)    Coronary artery disease    Depression    Diabetes mellitus without complication ([place])    Family history of adverse reaction to anesthesia    daughter-spinal headache afer epidural   History of tobacco abuse    Hyperlipidemia    Hypertension    Hyponatremia    Left carotid artery stenosis    Lung mass    Malnutrition (HCC)    Nausea vomiting and diarrhea 02/17/2022   PAD (peripheral artery disease) (HCC)    Pneumonia    Sleep apnea    does not use cpap   Spinal headache    after lung surgery   Stage 2 chronic kidney disease    Tachycardia     Past Surgical History:  Procedure Laterality Date   CAROTID STENT Left    LEFT HEART CATH AND CORONARY ANGIOGRAPHY Left 11/02/2019   Procedure: LEFT HEART CATH AND CORONARY ANGIOGRAPHY;  Surgeon: Teodoro Spray, MD;  Location: [facility];  Service: Cardiovascular;  Laterality: Left;   LUNG BIOPSY Right    2006   right side partial lobe removed   1990   Social History:  reports that he quit smoking about 6 years ago. His smoking use included cigarettes. He has a 52.00 pack-year smoking history. He has never used smokeless tobacco. He reports that he does not currently use alcohol after a past usage of about 3.0 standard drinks of alcohol per week. He reports that he does not use drugs.  Allergies  Allergen Reactions   Ace Inhibitors Other (See Comments)    Hypotension    Family History  Problem Relation Age of Onset   Cancer Mother    Heart attack Father    Cancer Sister     Prior to Admission medications   Medication Sig Start Date End Date Taking? Authorizing Provider  aspirin EC 81 MG tablet Take 81 mg by mouth every evening.    Yes [provider]  brimonidine (ALPHAGAN) 0.2 % ophthalmic solution Place  1 drop into both eyes 2 (two) times daily.   Yes [provider]  famotidine (PEPCID) 20 MG tablet Take 20 mg by mouth daily.   Yes [provider]  linagliptin (TRADJENTA) 5 MG TABS tablet Take 5 mg by mouth every morning.   Yes [provider]  metFORMIN (GLUCOPHAGE-XR) 750 MG 24 hr tablet Take 750 mg by mouth 2 (two) times daily.   Yes [provider]  metoprolol tartrate (LOPRESSOR) 50 MG tablet Take 50 mg by mouth 2 (two) times daily. 11/21/19  Yes [provider]  montelukast (SINGULAIR) 10 MG tablet Take 10 mg by mouth at bedtime. 01/09/21  Yes [provider]  rosuvastatin (CRESTOR) 40 MG tablet Take 40 mg by mouth every morning. 08/05/19  Yes [provider]  TRELEGY ELLIPTA 100-62.5-25  MCG/ACT AEPB Inhale 1 puff into the lungs daily. 03/13/22  Yes [provider]  albuterol (VENTOLIN HFA) 108 (90 Base) MCG/ACT inhaler 2 puffs every 6 (six) hours as needed. Patient not taking: Reported on 04/01/2022 06/25/21   [provider]  fluticasone-salmeterol (ADVAIR) 250-50 MCG/ACT AEPB Inhale 1 puff into the lungs in the morning and at bedtime. Patient not taking: Reported on 04/01/2022    [provider]  furosemide (LASIX) 20 MG tablet Take 20 mg by mouth as needed for edema.    [provider]  guaiFENesin (ROBITUSSIN) 100 MG/5ML liquid Take 5 mLs by mouth every 6 (six) hours as needed for cough or to loosen phlegm. 02/21/22   Jennye Boroughs, MD  ipratropium-albuterol (DUONEB) 0.5-2.5 (3) MG/3ML SOLN Take 3 mLs by nebulization every 4 (four) hours as needed. 03/06/21   Hosie Poisson, MD  levalbuterol (XOPENEX) 0.31 MG/3ML nebulizer solution Take 1 ampule by nebulization every 4 (four) hours as needed for wheezing.    [provider]  nitroGLYCERIN (NITROSTAT) 0.4 MG SL tablet Place 0.4 mg under the tongue every 5 (five) minutes x 3 doses as needed for chest pain.  Patient not taking: Reported on 04/01/2022    [provider]  sildenafil (REVATIO) 20 MG tablet Take 20-100 mg by mouth as needed.    [provider]  tiotropium (SPIRIVA) 18 MCG inhalation capsule Place 18 mcg into inhaler and inhale every morning. Patient not taking: Reported on 04/01/2022    [provider]    Physical Exam: Vitals:   04/01/22 2206 04/01/22 2215 04/01/22 2252 04/01/22 2336  BP:  (!) 116/59 (!) 116/59 120/66  Pulse:   (!) 112 (!) 115  Resp:   (!) 26 (!) 26  Temp: 98.4 F (36.9 C)     TempSrc: Oral     SpO2:  90% 98% 97%  Weight: 72.1 kg     Height: 5\' 7"  (1.702 m)      General: Patient is alert and awake giving a coherent history does not appear to be in any immediate distress Respiratory exam: Bilaterally air entry diffusely  diminished Cardiovascular exam S1-S2 normal Abdomen soft nontender Extremities warm without edema Patient looks nontoxic at this time On high flow nasal oxygen Data Reviewed:      Radiological Exams on Admission:      CT ABDOMEN PELVIS W CONTRAST  Result Date: 04/01/2022 CLINICAL DATA:  Upper abdominal pain EXAM: CT ABDOMEN AND PELVIS WITH CONTRAST TECHNIQUE: Multidetector CT imaging of the abdomen and pelvis was performed using the standard protocol following bolus administration of intravenous contrast. RADIATION DOSE REDUCTION: This exam was performed according to the departmental dose-optimization program  which includes automated exposure control, adjustment of the mA and/or kV according to patient size and/or use of iterative reconstruction technique. CONTRAST:  167mL OMNIPAQUE IOHEXOL 300 MG/ML  SOLN COMPARISON:  Partial comparison to CTA chest dated 02/18/2022. PET-CT dated 08/28/2021. CT abdomen/pelvis dated 02/05/2021. FINDINGS: Lower chest: Focal patchy opacities in the medial right lower lobe (series 4/image 27), new/progressive from recent CTA chest, likely reflecting radiation changes in this patient on SBRT. Extensive centrilobular and paraseptal emphysematous changes in the visualized lungs. Known pulmonary nodules are less conspicuous on the current study. Hepatobiliary: Liver is within normal limits. Gallbladder is unremarkable. No intrahepatic or extrahepatic ductal dilatation. Pancreas: Within normal limits. Spleen: Within normal limits. Adrenals/Urinary Tract: Adrenal glands are within normal limits. Right kidney is within normal limits. Multiple simple left renal cysts measuring up to 3.7 cm in the left lower kidney (series 2/image 40), benign (Bosniak I). No follow-up is recommended. No hydronephrosis. Mildly thick-walled bladder, chronic, likely reflecting chronic bladder outlet obstruction. Stomach/Bowel: Stomach is within normal limits. No evidence of bowel obstruction.  Normal appendix (series 2/image 49). Sigmoid diverticulosis, without evidence of diverticulitis. Vascular/Lymphatic: No evidence of abdominal aortic aneurysm. Atherosclerotic calcifications of the abdominal aorta and branch vessels. No suspicious abdominopelvic lymphadenopathy. Reproductive: Prostatomegaly, with mild margin of the central gland indenting the base of the bladder, suggesting BPH. Other: No abdominopelvic ascites. Mild fat in the bilateral inguinal canals. Musculoskeletal: Mild degenerative changes at L5-S1. IMPRESSION: No CT findings to account for the patient's upper abdominal pain. Focal patchy opacities in the medial right lower lobe, new/progressive from recent CTA chest, likely reflecting radiation changes in this patient on SBRT. Additional ancillary findings as above. Aortic Atherosclerosis (ICD10-I70.0) and Emphysema (ICD10-J43.9). Electronically Signed   By: Julian Hy M.D.   On: 04/01/2022 23:46   DG Chest Portable 1 View  Result Date: 04/01/2022 CLINICAL DATA:  Shortness of breath EXAM: PORTABLE CHEST 1 VIEW COMPARISON:  CTA chest 02/18/2022 and radiographs 02/17/2022 FINDINGS: Stable cardiomediastinal silhouette. Aortic atherosclerotic calcification. Hyperinflation and chronic bronchitic changes. Small right pleural effusion or pleural thickening. New focal airspace opacity in the right lower lung medially. Pleural-parenchymal scarring in the apices. IMPRESSION: Findings suspicious for right lower lung pneumonia superimposed on severe emphysema. Electronically Signed   By: Placido Sou M.D.   On: 04/01/2022 22:31    Results for orders placed or performed during the hospital encounter of 04/01/22 (from the past 48 hour(s))  Lactic acid, plasma     Status: Abnormal   Collection Time: 04/01/22 10:07 PM  Result Value Ref Range   Lactic Acid, Venous 2.5 (HH) 0.5 - 1.9 mmol/L    Comment: CRITICAL RESULT CALLED TO, READ BACK BY AND VERIFIED WITH HEATHER LEE @2253  0N 04/01/22  SKL Performed at [facility], [address] [place], [place] [address]   Comprehensive metabolic panel     Status: Abnormal   Collection Time: 04/01/22 10:08 PM  Result Value Ref Range   Sodium 138 135 - 145 mmol/L   Potassium 4.1 3.5 - 5.1 mmol/L   Chloride 103 98 - 111 mmol/L   CO2 24 22 - 32 mmol/L   Glucose, Bld 178 (H) 70 - 99 mg/dL    Comment: Glucose reference range applies only to samples taken after fasting for at least 8 hours.   BUN 16 8 - 23 mg/dL   Creatinine, Ser 1.01 0.61 - 1.24 mg/dL   Calcium 9.0 8.9 - 10.3 mg/dL   Total Protein 7.1 6.5 - 8.1 g/dL  Albumin 3.2 (L) 3.5 - 5.0 g/dL   AST 22 15 - 41 U/L   ALT 15 0 - 44 U/L   Alkaline Phosphatase 67 38 - 126 U/L   Total Bilirubin 1.1 0.3 - 1.2 mg/dL   GFR, Estimated >60 >60 mL/min    Comment: (NOTE) Calculated using the CKD-EPI Creatinine Equation (2021)    Anion gap 11 5 - 15    Comment: Performed at [facility], [address] [place], [place] [address]  CBC with Differential     Status: Abnormal   Collection Time: 04/01/22 10:08 PM  Result Value Ref Range   WBC 15.2 (H) 4.0 - 10.5 K/uL   RBC 4.86 4.22 - 5.81 MIL/uL   Hemoglobin 13.7 13.0 - 17.0 g/dL   HCT 44.1 39.0 - 52.0 %   MCV 90.7 80.0 - 100.0 fL   MCH 28.2 26.0 - 34.0 pg   MCHC 31.1 30.0 - 36.0 g/dL   RDW 15.4 11.5 - 15.5 %   Platelets 226 150 - 400 K/uL   nRBC 0.0 0.0 - 0.2 %   Neutrophils Relative % 78 %   Neutro Abs 12.1 (H) 1.7 - 7.7 K/uL   Lymphocytes Relative 8 %   Lymphs Abs 1.2 0.7 - 4.0 K/uL   Monocytes Relative 10 %   Monocytes Absolute 1.5 (H) 0.1 - 1.0 K/uL   Eosinophils Relative 2 %   Eosinophils Absolute 0.3 0.0 - 0.5 K/uL   Basophils Relative 1 %   Basophils Absolute 0.1 0.0 - 0.1 K/uL   Immature Granulocytes 1 %   Abs Immature Granulocytes 0.07 0.00 - 0.07 K/uL    Comment: Performed at [facility], [place], [place] [address]  Troponin I (High Sensitivity)     Status: None    Collection Time: 04/01/22 10:08 PM  Result Value Ref Range   Troponin I (High Sensitivity) 8 <18 ng/L    Comment: (NOTE) Elevated high sensitivity troponin I (hsTnI) values and significant  changes across serial measurements may suggest ACS but many other  chronic and acute conditions are known to elevate hsTnI results.  Refer to the "Links" section for chest pain algorithms and additional  guidance. Performed at [facility], [address] [place], [place] [address]   Brain natriuretic peptide     Status: None   Collection Time: 04/01/22 10:08 PM  Result Value Ref Range   B Natriuretic Peptide 68.3 0.0 - 100.0 pg/mL    Comment: Performed at [facility], [address] [place], [place] [address]  Lipase, blood     Status: None   Collection Time: 04/01/22 10:08 PM  Result Value Ref Range   Lipase 41 11 - 51 U/L    Comment: Performed at [facility], [address] [place],  [address]  Procalcitonin - Baseline     Status: None   Collection Time: 04/01/22 10:08 PM  Result Value Ref Range   Procalcitonin <0.10 ng/mL    Comment:        Interpretation: PCT (Procalcitonin) <= 0.5 ng/mL: Systemic infection (sepsis) is not likely. Local bacterial infection is possible. (NOTE)       Sepsis PCT Algorithm           Lower Respiratory Tract  Infection PCT Algorithm    ----------------------------     ----------------------------         PCT < 0.25 ng/mL                PCT < 0.10 ng/mL          Strongly encourage             Strongly discourage   discontinuation of antibiotics    initiation of antibiotics    ----------------------------     -----------------------------       PCT 0.25 - 0.50 ng/mL            PCT 0.10 - 0.25 ng/mL               OR       >80% decrease in PCT            Discourage initiation of                                            antibiotics      Encourage discontinuation           of  antibiotics    ----------------------------     -----------------------------         PCT >= 0.50 ng/mL              PCT 0.26 - 0.50 ng/mL               AND        <80% decrease in PCT             Encourage initiation of                                             antibiotics       Encourage continuation           of antibiotics    ----------------------------     -----------------------------        PCT >= 0.50 ng/mL                  PCT > 0.50 ng/mL               AND         increase in PCT                  Strongly encourage                                      initiation of antibiotics    Strongly encourage escalation           of antibiotics                                     -----------------------------                                           PCT <= 0.25 ng/mL  OR                                        > 80% decrease in PCT                                      Discontinue / Do not initiate                                             antibiotics  Performed at [facility], [address] [place], [place] [address]   Resp panel by RT-PCR (RSV, Flu A&B, Covid) Anterior Nasal Swab     Status: None   Collection Time: 04/01/22 10:14 PM   Specimen: Anterior Nasal Swab  Result Value Ref Range   SARS Coronavirus 2 by RT PCR NEGATIVE NEGATIVE    Comment: (NOTE) SARS-CoV-2 target nucleic acids are NOT DETECTED.  The SARS-CoV-2 RNA is generally detectable in upper respiratory specimens during the acute phase of infection. The lowest concentration of SARS-CoV-2 viral copies this assay can detect is 138 copies/mL. A negative result does not preclude SARS-Cov-2 infection and should not be used as the sole basis for treatment or other patient management decisions. A negative result may occur with  improper specimen collection/handling, submission of specimen other than nasopharyngeal swab, presence of viral mutation(s)  within the areas targeted by this assay, and inadequate number of viral copies(<138 copies/mL). A negative result must be combined with clinical observations, patient history, and epidemiological information. The expected result is Negative.  Fact Sheet for Patients:  EntrepreneurPulse.com.au  Fact Sheet for Healthcare Providers:  IncredibleEmployment.be  This test is no t yet approved or cleared by the [place] FDA and  has been authorized for detection and/or diagnosis of SARS-CoV-2 by FDA under an Emergency Use Authorization (EUA). This EUA will remain  in effect (meaning this test can be used) for the duration of the COVID-19 declaration under Section 564(b)(1) of the Act, 21 U.S.C.section 360bbb-3(b)(1), unless the authorization is terminated  or revoked sooner.       Influenza A by PCR NEGATIVE NEGATIVE   Influenza B by PCR NEGATIVE NEGATIVE    Comment: (NOTE) The Xpert Xpress SARS-CoV-2/FLU/RSV plus assay is intended as an aid in the diagnosis of influenza from Nasopharyngeal swab specimens and should not be used as a sole basis for treatment. Nasal washings and aspirates are unacceptable for Xpert Xpress SARS-CoV-2/FLU/RSV testing.  Fact Sheet for Patients: EntrepreneurPulse.com.au  Fact Sheet for Healthcare Providers: IncredibleEmployment.be  This test is not yet approved or cleared by the [place] FDA and has been authorized for detection and/or diagnosis of SARS-CoV-2 by FDA under an Emergency Use Authorization (EUA). This EUA will remain in effect (meaning this test can be used) for the duration of the COVID-19 declaration under Section 564(b)(1) of the Act, 21 U.S.C. section 360bbb-3(b)(1), unless the authorization is terminated or revoked.     Resp Syncytial Virus by PCR NEGATIVE NEGATIVE    Comment: (NOTE) Fact Sheet for  Patients: EntrepreneurPulse.com.au  Fact Sheet for Healthcare Providers: IncredibleEmployment.be  This test is not yet approved or cleared by the [place] FDA and has been authorized for detection and/or diagnosis  of SARS-CoV-2 by FDA under an Emergency Use Authorization (EUA). This EUA will remain in effect (meaning this test can be used) for the duration of the COVID-19 declaration under Section 564(b)(1) of the Act, 21 U.S.C. section 360bbb-3(b)(1), unless the authorization is terminated or revoked.  Performed at [facility], [address] [place], [place] [address]   Blood gas, venous     Status: Abnormal (Preliminary result)   Collection Time: 04/01/22 10:15 PM  Result Value Ref Range   pH, Ven 7.4 7.25 - 7.43   pCO2, Ven 45 44 - 60 mmHg   pO2, Ven PENDING 32 - 45 mmHg   Bicarbonate 27.9 20.0 - 28.0 mmol/L   Acid-Base Excess 2.5 (H) 0.0 - 2.0 mmol/L   O2 Saturation 30.4 %   Patient temperature 37.0    Collection site VEIN     Comment: Performed at [facility], [address] [place],  [address]     Assessment and Plan: * Pneumonia This post cefepime and azithromycin in the ER.  Blood cultures have been drawn.  Treat with ceftriaxone and azithromycin  Acute on chronic respiratory failure with hypoxia (HCC) Continue with high flow nasal oxygen.  Treat the patient in the progressive care unit.  This is likely due to pneumonia.  Sepsis due to pneumonia [facility]) Patient received 2 L of saline bolus as well as further some fluid as continuous infusion.  At this time lactic acidosis is trending down patient does not look toxic.  We will trend lactic acid and if trending down plan on holding off on further IV fluids  Essential hypertension Patient's blood pressure is borderline, I am holding the patient's high antihypertensives this evening, currently restart in the a.m. including Lasix.  Non-small cell  lung cancer (NSCLC) [facility]) Patient reports getting monthly radiation therapy at the [facility] cancer center.  Does not report being on systemic medications  Lactic acidosis Trending down, I believe lactic hypoxia was also a major contributor to patient's lactic acidosis.  I want to be careful with the fluids given patient's marked hypoxia.  Trend      Advance Care Planning:   Code Status: Prior full code  Consults: Not applicable  Family Communication: It is approximately 1 AM right now I think it would be too late to call family  Severity of Illness: The appropriate patient status for this patient is INPATIENT. Inpatient status is judged to be reasonable and necessary in order to provide the required intensity of service to ensure the patient's safety. The patient's presenting symptoms, physical exam findings, and initial radiographic and laboratory data in the context of their chronic comorbidities is felt to place them at high risk for further clinical deterioration. Furthermore, it is not anticipated that the patient will be medically stable for discharge from the hospital within 2 midnights of admission.   * I certify that at the point of admission it is my clinical judgment that the patient will require inpatient hospital care spanning beyond 2 midnights from the point of admission due to high intensity of service, high risk for further deterioration and high frequency of surveillance required.*  Author: Gertie Fey, MD 04/02/2022 12:27 AM  For on call review [email or web address].

## 2022-04-02 NOTE — Assessment & Plan Note (Signed)
This post cefepime and azithromycin in the ER.  Blood cultures have been drawn.  Treat with ceftriaxone and azithromycin

## 2022-04-02 NOTE — ED Notes (Signed)
Transport requested for patient.

## 2022-04-02 NOTE — Assessment & Plan Note (Signed)
Patient reports getting monthly radiation therapy at the [facility] cancer center.  Does not report being on systemic medications

## 2022-04-02 NOTE — Progress Notes (Signed)
PROGRESS NOTE    Bradley [name]   IZT:[id number] DOB: [birth year]  DOA: 04/01/2022 Date of Service: 04/02/22 PCP: Valera Castle, MD     Brief Narrative / Hospital Course:  Bradley [name] is a 72 y.o. male with medical history significant of COPD with baseline use of 4 L/min of supplemental oxygen at home.  In spite of this patient does report that activities of daily living do cause him some dyspnea.  However over the last 3 days patient states that he has had a new onset of cough with production of brown sputum associated with worsening dyspnea.  Today he said that he was short of breath at rest and feeling nauseous and threw up about 3 times.  Nonbloody.  Patient reports a temperature of 100 F at home.  01/29: (+)CXR PNA. Azithromycin, cefepime, 1L fluids in ED.  01/30: Admitted to [facility] for sepsis d/t pneumonia and acute on chronic hypoxic resp fail requiring HFNC O2. Tx fluids 1L LR, continue on azithro + ceftriaxone. Added steroids x5 day course po prednisone   Consultants:  none  Procedures: none      ASSESSMENT & PLAN:   Principal Problem:   Pneumonia Active Problems:   Acute on chronic respiratory failure with hypoxia (HCC)   Sepsis due to pneumonia [facility])   Essential hypertension   Non-small cell lung cancer (NSCLC) (HCC)   Lactic acidosis  Sepsis due to pneumonia ([place]) Community Acquired Pneumonia - right lower lung pneumonia superimposed on severe emphysema. post cefepime and azithromycin in the ER.   Follow cultures ceftriaxone and azithromycin - abx Day 2 today 04/02/22   Acute on chronic respiratory failure with hypoxia (HCC) due to pneumonia, COPD Continue with high flow nasal oxygen DuoNebs echeduled Continue home Dulera, montelukast   Essential hypertension Patient's blood pressure is borderline holding the patient's antihypertensives Follow VS and restart meds as able   Lactic acidosis Trending down Monitor as needed Treat underlying  conditions   Non-small cell lung cancer (NSCLC) ([place]) radiation therapy at the [facility] cancer center.  No systemic meds     DVT prophylaxis: lovenox Pertinent IV fluids/nutrition: no continuous fluids at this time  Central lines / invasive devices: none  Code Status: FULL CODE  Current Admission Status: inpatient  TOC needs / Dispo plan: none Barriers to discharge / significant pending items: O2 reqirement and IV abx pending cultures              Subjective / Brief ROS:  Patient reports coughing, overall feeling better Denies CP Reports SOB w/ any exertion  Pain controlled.  Denies new weakness.  Tolerating diet.  Reports no concerns w/ urination/defecation.   Family Communication: support person at bedside on rounds.     Objective Findings:  Vitals:   04/02/22 1300 04/02/22 1330 04/02/22 1400 04/02/22 1430  BP: 124/64 118/62 107/64 (!) 106/57  Pulse: 85 91 90 90  Resp: (!) 23 (!) 22 (!) 24 (!) 22  Temp:    98 F (36.7 C)  TempSrc:    Oral  SpO2: 98% 100% 96% 96%  Weight:      Height:       No intake or output data in the 24 hours ending 04/02/22 1650 Filed Weights   04/01/22 2206  Weight: 72.1 kg    Examination:  Physical Exam Constitutional:      General: He is not in acute distress.    Appearance: He is ill-appearing. He is not toxic-appearing or diaphoretic.  Cardiovascular:     Rate and Rhythm: Normal rate and regular rhythm.     Heart sounds: Normal heart sounds.  Pulmonary:     Effort: Pulmonary effort is normal. No respiratory distress.     Breath sounds: Decreased air movement present. Examination of the right-lower field reveals decreased breath sounds. Examination of the left-lower field reveals decreased breath sounds. Decreased breath sounds present.  Abdominal:     General: Abdomen is flat. Bowel sounds are normal.     Palpations: Abdomen is soft.  Musculoskeletal:     Cervical back: Neck supple.     Right lower leg: No edema.      Left lower leg: No edema.  Skin:    General: Skin is warm and dry.  Neurological:     General: No focal deficit present.     Mental Status: He is alert and oriented to person, place, and time.     Cranial Nerves: No cranial nerve deficit.  Psychiatric:        Mood and Affect: Mood normal.        Behavior: Behavior normal.        Thought Content: Thought content normal.        Judgment: Judgment normal.          Scheduled Medications:   aspirin EC  81 mg Oral QPM   brimonidine  1 drop Both Eyes BID   enoxaparin (LOVENOX) injection  40 mg Subcutaneous Q24H   famotidine  20 mg Oral Daily   insulin aspart  0-15 Units Subcutaneous TID WC   insulin aspart  0-5 Units Subcutaneous QHS   ipratropium-albuterol  3 mL Nebulization Q6H   mometasone-formoterol  2 puff Inhalation BID   montelukast  10 mg Oral QHS   rosuvastatin  40 mg Oral BH-q7a   sodium chloride flush  3 mL Intravenous Q12H    Continuous Infusions:  azithromycin     cefTRIAXone (ROCEPHIN)  IV Stopped (04/02/22 1142)    PRN Medications:  acetaminophen **OR** acetaminophen, albuterol  Antimicrobials from admission:  Anti-infectives (From admission, onward)    Start     Dose/Rate Route Frequency Ordered Stop   04/02/22 2200  azithromycin (ZITHROMAX) 250 mg in dextrose 5 % 125 mL IVPB        250 mg 127.5 mL/hr over 60 Minutes Intravenous Every 24 hours 04/02/22 0931     04/02/22 1130  azithromycin (ZITHROMAX) 500 mg in sodium chloride 0.9 % 250 mL IVPB  Status:  Discontinued        500 mg 250 mL/hr over 60 Minutes Intravenous Every 24 hours 04/02/22 1116 04/02/22 1117   04/02/22 1130  cefTRIAXone (ROCEPHIN) 2 g in sodium chloride 0.9 % 100 mL IVPB  Status:  Discontinued        2 g 200 mL/hr over 30 Minutes Intravenous Every 24 hours 04/02/22 1116 04/02/22 1117   04/02/22 1000  cefTRIAXone (ROCEPHIN) 2 g in sodium chloride 0.9 % 100 mL IVPB        2 g 200 mL/hr over 30 Minutes Intravenous Every 24 hours  04/02/22 0931     04/01/22 2300  ceFEPIme (MAXIPIME) 2 g in sodium chloride 0.9 % 100 mL IVPB        2 g 200 mL/hr over 30 Minutes Intravenous  Once 04/01/22 2249 04/02/22 0030   04/01/22 2300  azithromycin (ZITHROMAX) 500 mg in sodium chloride 0.9 % 250 mL IVPB        500 mg 250  mL/hr over 60 Minutes Intravenous  Once 04/01/22 2249 04/02/22 0031           Data Reviewed:  I have personally reviewed the following...  CBC: Recent Labs  Lab 04/01/22 2208  WBC 15.2*  NEUTROABS 12.1*  HGB 13.7  HCT 44.1  MCV 90.7  PLT 448   Basic Metabolic Panel: Recent Labs  Lab 04/01/22 2208 04/02/22 1123  NA 138 137  K 4.1 4.1  CL 103 105  CO2 24 18*  GLUCOSE 178* 235*  BUN 16 13  CREATININE 1.01 0.93  CALCIUM 9.0 8.6*   GFR: Estimated Creatinine Clearance: 67.1 mL/min (by C-G formula based on SCr of 0.93 mg/dL). Liver Function Tests: Recent Labs  Lab 04/01/22 2208  AST 22  ALT 15  ALKPHOS 67  BILITOT 1.1  PROT 7.1  ALBUMIN 3.2*   Recent Labs  Lab 04/01/22 2208  LIPASE 41   No results for input(s): "AMMONIA" in the last 168 hours. Coagulation Profile: Recent Labs  Lab 04/02/22 1123  INR 1.1   Cardiac Enzymes: No results for input(s): "CKTOTAL", "CKMB", "CKMBINDEX", "TROPONINI" in the last 168 hours. BNP (last 3 results) No results for input(s): "PROBNP" in the last 8760 hours. HbA1C: Recent Labs    04/02/22 1123  HGBA1C 7.5*   CBG: Recent Labs  Lab 04/02/22 1207  GLUCAP 224*   Lipid Profile: No results for input(s): "CHOL", "HDL", "LDLCALC", "TRIG", "CHOLHDL", "LDLDIRECT" in the last 72 hours. Thyroid Function Tests: No results for input(s): "TSH", "T4TOTAL", "FREET4", "T3FREE", "THYROIDAB" in the last 72 hours. Anemia Panel: No results for input(s): "VITAMINB12", "FOLATE", "FERRITIN", "TIBC", "IRON", "RETICCTPCT" in the last 72 hours. Most Recent Urinalysis On File:     Component Value Date/Time   COLORURINE YELLOW (A) 03/01/2021 1938    APPEARANCEUR CLOUDY (A) 03/01/2021 1938   LABSPEC 1.016 03/01/2021 1938   PHURINE 5.0 03/01/2021 1938   GLUCOSEU >=500 (A) 03/01/2021 1938   HGBUR SMALL (A) 03/01/2021 1938   BILIRUBINUR NEGATIVE 03/01/2021 1938   KETONESUR NEGATIVE 03/01/2021 1938   PROTEINUR 100 (A) 03/01/2021 1938   NITRITE NEGATIVE 03/01/2021 1938   LEUKOCYTESUR NEGATIVE 03/01/2021 1938   Sepsis Labs: @LABRCNTIP (procalcitonin:4,lacticidven:4) Microbiology: Recent Results (from the past 240 hour(s))  Blood culture (routine x 2)     Status: None (Preliminary result)   Collection Time: 04/01/22 10:08 PM   Specimen: BLOOD  Result Value Ref Range Status   Specimen Description BLOOD RIGHT FA  Final   Special Requests   Final    BOTTLES DRAWN AEROBIC AND ANAEROBIC Blood Culture results may not be optimal due to an inadequate volume of blood received in culture bottles   Culture   Final    NO GROWTH < 12 HOURS Performed at [facility], [address], [place], [place] [address]    Report Status PENDING  Incomplete  Resp panel by RT-PCR (RSV, Flu A&B, Covid) Anterior Nasal Swab     Status: None   Collection Time: 04/01/22 10:14 PM   Specimen: Anterior Nasal Swab  Result Value Ref Range Status   SARS Coronavirus 2 by RT PCR NEGATIVE NEGATIVE Final    Comment: (NOTE) SARS-CoV-2 target nucleic acids are NOT DETECTED.  The SARS-CoV-2 RNA is generally detectable in upper respiratory specimens during the acute phase of infection. The lowest concentration of SARS-CoV-2 viral copies this assay can detect is 138 copies/mL. A negative result does not preclude SARS-Cov-2 infection and should not be used as the sole basis for treatment  or other patient management decisions. A negative result may occur with  improper specimen collection/handling, submission of specimen other than nasopharyngeal swab, presence of viral mutation(s) within the areas targeted by this assay, and inadequate number of viral copies(<138  copies/mL). A negative result must be combined with clinical observations, patient history, and epidemiological information. The expected result is Negative.  Fact Sheet for Patients:  EntrepreneurPulse.com.au  Fact Sheet for Healthcare Providers:  IncredibleEmployment.be  This test is no t yet approved or cleared by the [place] FDA and  has been authorized for detection and/or diagnosis of SARS-CoV-2 by FDA under an Emergency Use Authorization (EUA). This EUA will remain  in effect (meaning this test can be used) for the duration of the COVID-19 declaration under Section 564(b)(1) of the Act, 21 U.S.C.section 360bbb-3(b)(1), unless the authorization is terminated  or revoked sooner.       Influenza A by PCR NEGATIVE NEGATIVE Final   Influenza B by PCR NEGATIVE NEGATIVE Final    Comment: (NOTE) The Xpert Xpress SARS-CoV-2/FLU/RSV plus assay is intended as an aid in the diagnosis of influenza from Nasopharyngeal swab specimens and should not be used as a sole basis for treatment. Nasal washings and aspirates are unacceptable for Xpert Xpress SARS-CoV-2/FLU/RSV testing.  Fact Sheet for Patients: EntrepreneurPulse.com.au  Fact Sheet for Healthcare Providers: IncredibleEmployment.be  This test is not yet approved or cleared by the [place] FDA and has been authorized for detection and/or diagnosis of SARS-CoV-2 by FDA under an Emergency Use Authorization (EUA). This EUA will remain in effect (meaning this test can be used) for the duration of the COVID-19 declaration under Section 564(b)(1) of the Act, 21 U.S.C. section 360bbb-3(b)(1), unless the authorization is terminated or revoked.     Resp Syncytial Virus by PCR NEGATIVE NEGATIVE Final    Comment: (NOTE) Fact Sheet for Patients: EntrepreneurPulse.com.au  Fact Sheet for Healthcare  Providers: IncredibleEmployment.be  This test is not yet approved or cleared by the [place] FDA and has been authorized for detection and/or diagnosis of SARS-CoV-2 by FDA under an Emergency Use Authorization (EUA). This EUA will remain in effect (meaning this test can be used) for the duration of the COVID-19 declaration under Section 564(b)(1) of the Act, 21 U.S.C. section 360bbb-3(b)(1), unless the authorization is terminated or revoked.  Performed at [facility], [address], [place], [place] [address]       Radiology Studies last 3 days: CT ABDOMEN PELVIS W CONTRAST  Result Date: 04/01/2022 CLINICAL DATA:  Upper abdominal pain EXAM: CT ABDOMEN AND PELVIS WITH CONTRAST TECHNIQUE: Multidetector CT imaging of the abdomen and pelvis was performed using the standard protocol following bolus administration of intravenous contrast. RADIATION DOSE REDUCTION: This exam was performed according to the departmental dose-optimization program which includes automated exposure control, adjustment of the mA and/or kV according to patient size and/or use of iterative reconstruction technique. CONTRAST:  177mL OMNIPAQUE IOHEXOL 300 MG/ML  SOLN COMPARISON:  Partial comparison to CTA chest dated 02/18/2022. PET-CT dated 08/28/2021. CT abdomen/pelvis dated 02/05/2021. FINDINGS: Lower chest: Focal patchy opacities in the medial right lower lobe (series 4/image 27), new/progressive from recent CTA chest, likely reflecting radiation changes in this patient on SBRT. Extensive centrilobular and paraseptal emphysematous changes in the visualized lungs. Known pulmonary nodules are less conspicuous on the current study. Hepatobiliary: Liver is within normal limits. Gallbladder is unremarkable. No intrahepatic or extrahepatic ductal dilatation. Pancreas: Within normal limits. Spleen: Within normal limits. Adrenals/Urinary Tract: Adrenal glands are within normal  limits. Right kidney  is within normal limits. Multiple simple left renal cysts measuring up to 3.7 cm in the left lower kidney (series 2/image 40), benign (Bosniak I). No follow-up is recommended. No hydronephrosis. Mildly thick-walled bladder, chronic, likely reflecting chronic bladder outlet obstruction. Stomach/Bowel: Stomach is within normal limits. No evidence of bowel obstruction. Normal appendix (series 2/image 49). Sigmoid diverticulosis, without evidence of diverticulitis. Vascular/Lymphatic: No evidence of abdominal aortic aneurysm. Atherosclerotic calcifications of the abdominal aorta and branch vessels. No suspicious abdominopelvic lymphadenopathy. Reproductive: Prostatomegaly, with mild margin of the central gland indenting the base of the bladder, suggesting BPH. Other: No abdominopelvic ascites. Mild fat in the bilateral inguinal canals. Musculoskeletal: Mild degenerative changes at L5-S1. IMPRESSION: No CT findings to account for the patient's upper abdominal pain. Focal patchy opacities in the medial right lower lobe, new/progressive from recent CTA chest, likely reflecting radiation changes in this patient on SBRT. Additional ancillary findings as above. Aortic Atherosclerosis (ICD10-I70.0) and Emphysema (ICD10-J43.9). Electronically Signed   By: Julian Hy M.D.   On: 04/01/2022 23:46   DG Chest Portable 1 View  Result Date: 04/01/2022 CLINICAL DATA:  Shortness of breath EXAM: PORTABLE CHEST 1 VIEW COMPARISON:  CTA chest 02/18/2022 and radiographs 02/17/2022 FINDINGS: Stable cardiomediastinal silhouette. Aortic atherosclerotic calcification. Hyperinflation and chronic bronchitic changes. Small right pleural effusion or pleural thickening. New focal airspace opacity in the right lower lung medially. Pleural-parenchymal scarring in the apices. IMPRESSION: Findings suspicious for right lower lung pneumonia superimposed on severe emphysema. Electronically Signed   By: Placido Sou M.D.   On: 04/01/2022 22:31              LOS: 0 days    Time spent: 35 min    Emeterio Reeve, DO [facility] 04/02/2022, 4:50 PM    Dictation software may have been used to generate the above note. Typos may occur and escape review in typed/dictated notes. Please contact Dr Sheppard Coil directly for clarity if needed.  Staff may message me via secure chat in [place]  but this may not receive an immediate response,  please page me for urgent matters!  If 7PM-7AM, please contact night coverage [email or web address]

## 2022-04-02 NOTE — Hospital Course (Addendum)
Taken from prior notes  72 year old male with past medical history of COPD and hypertension plus non-small cell lung cancer getting monthly radiation therapy and chronic respiratory failure on 4 L nasal cannula who presented to the emergency room on 1/29 with complaints of several days of progressively worsening shortness of breath and productive cough with brown sputum.  In the emergency room, workup revealed patient with sepsis secondary to pneumonia and patient started on IV fluids and antibiotics.   Initially patient requiring high flow nasal oxygen as high as 40 L.  Over the next few days, he has gradually improved and is currently down to 6 L nasal cannula (nonhigh flow).   2/6: Vital stable.  Weaned back to 4 L of oxygen which is his baseline.  Completed a course of antibiotic for pneumonia.  Patient becoming hypoxic with minor exertion. PT evaluation was obtained and they are recommending SNF.  2/7: Hemodynamically stable.  Saturating well on baseline oxygen of 4 L at rest but do need 5 to 6 L of oxygen with ambulation.  TOC is trying to find a place which can take up to 6 L of oxygen. Might be his new baseline

## 2022-04-02 NOTE — Assessment & Plan Note (Signed)
Patient's blood pressure is borderline, I am holding the patient's high antihypertensives this evening, currently restart in the a.m. including Lasix.

## 2022-04-03 DIAGNOSIS — R6520 Severe sepsis without septic shock: Secondary | ICD-10-CM | POA: Diagnosis not present

## 2022-04-03 DIAGNOSIS — J189 Pneumonia, unspecified organism: Secondary | ICD-10-CM | POA: Diagnosis not present

## 2022-04-03 DIAGNOSIS — A419 Sepsis, unspecified organism: Secondary | ICD-10-CM | POA: Diagnosis not present

## 2022-04-03 DIAGNOSIS — J9621 Acute and chronic respiratory failure with hypoxia: Secondary | ICD-10-CM | POA: Diagnosis not present

## 2022-04-03 DIAGNOSIS — J9601 Acute respiratory failure with hypoxia: Secondary | ICD-10-CM

## 2022-04-03 LAB — LACTIC ACID, PLASMA: Lactic Acid, Venous: 1.7 mmol/L (ref 0.5–1.9)

## 2022-04-03 LAB — GLUCOSE, CAPILLARY
Glucose-Capillary: 307 mg/dL — ABNORMAL HIGH (ref 70–99)
Glucose-Capillary: 214 mg/dL — ABNORMAL HIGH (ref 70–99)
Glucose-Capillary: 235 mg/dL — ABNORMAL HIGH (ref 70–99)
Glucose-Capillary: 222 mg/dL — ABNORMAL HIGH (ref 70–99)

## 2022-04-03 LAB — CBC
HCT: 37.9 % — ABNORMAL LOW (ref 39.0–52.0)
Hemoglobin: 12.2 g/dL — ABNORMAL LOW (ref 13.0–17.0)
MCH: 28.6 pg (ref 26.0–34.0)
MCHC: 32.2 g/dL (ref 30.0–36.0)
MCV: 88.8 fL (ref 80.0–100.0)
Platelets: 218 10*3/uL (ref 150–400)
RBC: 4.27 MIL/uL (ref 4.22–5.81)
RDW: 15.4 % (ref 11.5–15.5)
WBC: 12.5 10*3/uL — ABNORMAL HIGH (ref 4.0–10.5)
nRBC: 0 % (ref 0.0–0.2)

## 2022-04-03 LAB — BASIC METABOLIC PANEL
Anion gap: 7 (ref 5–15)
BUN: 18 mg/dL (ref 8–23)
CO2: 24 mmol/L (ref 22–32)
Calcium: 8.8 mg/dL — ABNORMAL LOW (ref 8.9–10.3)
Chloride: 104 mmol/L (ref 98–111)
Creatinine, Ser: 1.08 mg/dL (ref 0.61–1.24)
GFR, Estimated: >60 mL/min (ref >60)
Glucose, Bld: 363 mg/dL — ABNORMAL HIGH (ref 70–99)
Potassium: 4.4 mmol/L (ref 3.5–5.1)
Sodium: 135 mmol/L (ref 135–145)

## 2022-04-03 MED ORDER — SALINE SPRAY 0.65 % NA SOLN
1.0000 | NASAL | Status: DC | PRN
  Administered 2022-04-03: 1 via NASAL
  Filled 2022-04-03: qty 44

## 2022-04-03 MED ORDER — MELATONIN 5 MG PO TABS
5.0000 mg | ORAL_TABLET | Freq: Once | ORAL | Status: AC
  Administered 2022-04-03: 5 mg via ORAL
  Filled 2022-04-03: qty 1

## 2022-04-03 MED ORDER — AZITHROMYCIN 250 MG PO TABS
250.0000 mg | ORAL_TABLET | Freq: Every day | ORAL | Status: AC
  Administered 2022-04-04 – 2022-04-05 (×2): 250 mg via ORAL
  Filled 2022-04-03 (×2): qty 1

## 2022-04-03 NOTE — Consult Note (Deleted)
MEDICATION RELATED CONSULT NOTE - FOLLOW UP   Pharmacy Consult for SOB due to pneumonia  Allergies  Allergen Reactions   Ace Inhibitors Other (See Comments)    Hypotension   SH: quit smoking 6 years ago but had a 52 pack year smoking history. He never used smokeless tobacco. No alcohol and recreational drug use.    Vital Signs: Temp: 97.8 F (36.6 C) (01/31 0738) BP: 120/69 (01/31 0738) Pulse Rate: 93 (01/31 0738) Intake/Output from previous day: 01/30 0701 - 01/31 0700 In: 239 [P.O.:236; I.V.:3] Out: 2100 [Urine:2100]  Labs: Recent Labs    04/01/22 2208 04/02/22 1123 04/03/22 0221  WBC 15.2*  --  12.5*  HGB 13.7  --  12.2*  HCT 44.1  --  37.9*  PLT 226  --  218  APTT  --  31  --   CREATININE 1.01 0.93 1.08  ALBUMIN 3.2*  --   --   PROT 7.1  --   --   AST 22  --   --   ALT 15  --   --   ALKPHOS 67  --   --   BILITOT 1.1  --   --    Estimated Creatinine Clearance: 57.8 mL/min (by C-G formula based on SCr of 1.08 mg/dL).   Medications:  Medications Prior to Admission  Medication Sig Dispense Refill Last Dose   aspirin EC 81 MG tablet Take 81 mg by mouth every evening.    Past Week   brimonidine (ALPHAGAN) 0.2 % ophthalmic solution Place 1 drop into both eyes 2 (two) times daily.   Past Week   famotidine (PEPCID) 20 MG tablet Take 20 mg by mouth daily.   Past Week   linagliptin (TRADJENTA) 5 MG TABS tablet Take 5 mg by mouth every morning.   Past Week   metFORMIN (GLUCOPHAGE-XR) 750 MG 24 hr tablet Take 750 mg by mouth 2 (two) times daily.   Past Week   metoprolol tartrate (LOPRESSOR) 50 MG tablet Take 50 mg by mouth 2 (two) times daily.   Past Week   montelukast (SINGULAIR) 10 MG tablet Take 10 mg by mouth at bedtime.   Past Week   rosuvastatin (CRESTOR) 40 MG tablet Take 40 mg by mouth every morning.   Past Week   TRELEGY ELLIPTA 100-62.5-25 MCG/ACT AEPB Inhale 1 puff into the lungs daily.   Past Week   albuterol (VENTOLIN HFA) 108 (90 Base) MCG/ACT inhaler 2  puffs every 6 (six) hours as needed. (Patient not taking: Reported on 04/01/2022)   Not Taking at prn   fluticasone-salmeterol (ADVAIR) 250-50 MCG/ACT AEPB Inhale 1 puff into the lungs in the morning and at bedtime. (Patient not taking: Reported on 04/01/2022)   Not Taking   furosemide (LASIX) 20 MG tablet Take 20 mg by mouth as needed for edema.   prn at prn   guaiFENesin (ROBITUSSIN) 100 MG/5ML liquid Take 5 mLs by mouth every 6 (six) hours as needed for cough or to loosen phlegm. 120 mL 0 prn at prn   ipratropium-albuterol (DUONEB) 0.5-2.5 (3) MG/3ML SOLN Take 3 mLs by nebulization every 4 (four) hours as needed. 360 mL 2 prn at prn   levalbuterol (XOPENEX) 0.31 MG/3ML nebulizer solution Take 1 ampule by nebulization every 4 (four) hours as needed for wheezing.   prn at prn   nitroGLYCERIN (NITROSTAT) 0.4 MG SL tablet Place 0.4 mg under the tongue every 5 (five) minutes x 3 doses as needed for chest pain.  (Patient  not taking: Reported on 04/01/2022)   Not Taking   sildenafil (REVATIO) 20 MG tablet Take 20-100 mg by mouth as needed.   prn at prn   tiotropium (SPIRIVA) 18 MCG inhalation capsule Place 18 mcg into inhaler and inhale every morning. (Patient not taking: Reported on 04/01/2022)   Not Taking   Scheduled:   aspirin EC  81 mg Oral QPM   brimonidine  1 drop Both Eyes BID   enoxaparin (LOVENOX) injection  40 mg Subcutaneous Q24H   famotidine  20 mg Oral Daily   insulin aspart  0-15 Units Subcutaneous TID WC   insulin aspart  0-5 Units Subcutaneous QHS   ipratropium-albuterol  3 mL Nebulization Q6H   mometasone-formoterol  2 puff Inhalation BID   montelukast  10 mg Oral QHS   predniSONE  50 mg Oral Q breakfast   rosuvastatin  40 mg Oral BH-q7a   sodium chloride flush  3 mL Intravenous Q12H   Anti-infectives (From admission, onward)    Start     Dose/Rate Route Frequency Ordered Stop   04/02/22 2200  azithromycin (ZITHROMAX) 250 mg in dextrose 5 % 125 mL IVPB        250 mg 127.5 mL/hr  over 60 Minutes Intravenous Every 24 hours 04/02/22 0931     04/02/22 1130  azithromycin (ZITHROMAX) 500 mg in sodium chloride 0.9 % 250 mL IVPB  Status:  Discontinued        500 mg 250 mL/hr over 60 Minutes Intravenous Every 24 hours 04/02/22 1116 04/02/22 1117   04/02/22 1130  cefTRIAXone (ROCEPHIN) 2 g in sodium chloride 0.9 % 100 mL IVPB  Status:  Discontinued        2 g 200 mL/hr over 30 Minutes Intravenous Every 24 hours 04/02/22 1116 04/02/22 1117   04/02/22 1000  cefTRIAXone (ROCEPHIN) 2 g in sodium chloride 0.9 % 100 mL IVPB        2 g 200 mL/hr over 30 Minutes Intravenous Every 24 hours 04/02/22 0931     04/01/22 2300  ceFEPIme (MAXIPIME) 2 g in sodium chloride 0.9 % 100 mL IVPB        2 g 200 mL/hr over 30 Minutes Intravenous  Once 04/01/22 2249 04/02/22 0030   04/01/22 2300  azithromycin (ZITHROMAX) 500 mg in sodium chloride 0.9 % 250 mL IVPB        500 mg 250 mL/hr over 60 Minutes Intravenous  Once 04/01/22 2249 04/02/22 0031       Assessment and Plan: Pt came in with new onset of cough with production of brown sputum associated with worsening dyspnea. He also has N/V up to 3 times with nonbloody vomit. Report in no chest pain/no palpitations. Pt does not have diarrhea and no abdominal pain.  Anemia COPD Pt is considered group E as he is hospitalized. He was already given methylprednisolone by EMS but need to continue for 4 more days. Since he has increased sputum purulence and dyspnea, he needs   Put pt back on Trelegy to treat COPD and d/c Duoneb from hospital admission. D/c guaifenesin once cough stops. Put pt back on levalbuterol PRN for SOB/wheezing. Continue montelukast 10 mg tablets in evening.  Acute on chronic respiratory failure with hypoxia Pt RR averaged at 23 but currently stable at 18 Continue pt on high flow nasal oxygen as this is due to pneumonia  Lactic acidosis Lactic acidosis due to hypoxia. Keep pt on oxygen  DM Pt A1C (7.5%) and glucose  (trending  upwards of 300s) is not at goal.  Meds: Metformin 750mg  24 hr tablet: 750mg  PO 2 times daily Linagliptin 5 mg tablets qd  Pt metformin should have a MDD of 2000mg . Need to titrate up his dose and make sure he is adherent due to metformin having AE of GI upset. Consider adding another agent such as jardiance or farxiga 25mg  as his GFR >60 and has heart disease.  HLD Labs are not available to assess how well the medication is working Meds Rosuvastatin 40mg   Continue rosuvastatin 40mg  qd. If LDL still high consider adding ezetimibe  HTN Pt BP is stable but has had diastolic hypotensive readings throughout the day.  Meds: Furosemide 20mg  tablet Metoprolol tartate: 50 mg BID   Continue htn medications but have pt monitor BP at home.  Sepsis due to pneumonia Continue treating pneumonia with ceftriaxone and azithromycin for 5 days     Jowanda Heeg 04/03/2022,7:39 AM

## 2022-04-03 NOTE — Progress Notes (Signed)
PROGRESS NOTE    Bradley [name]   JJH:[id number] DOB: [birth year]  DOA: 04/01/2022 Date of Service: 04/03/22 PCP: Valera Castle, MD     Brief Narrative / [name] Course:  Bradley IDDINGS is a 72 y.o. male with medical history significant of COPD with baseline use of 4 L/min of supplemental oxygen at home.  In spite of this patient does report that activities of daily living do cause him some dyspnea.  However over the last 3 days patient states that he has had a new onset of cough with production of brown sputum associated with worsening dyspnea.  Today he said that he was short of breath at rest and feeling nauseous and threw up about 3 times.  Nonbloody.  Patient reports a temperature of 100 F at home.  01/29: (+)CXR PNA. Azithromycin, cefepime, 1L fluids in ED.  01/30: Admitted to [name] for sepsis d/t pneumonia and acute on chronic hypoxic resp fail requiring HFNC O2. Tx fluids 1L LR, continue on azithro + ceftriaxone. Added steroids x5 day course po prednisone   Consultants:  none  Procedures: none  ASSESSMENT & PLAN:   Principal Problem:   Pneumonia Active Problems:   Acute on chronic respiratory failure with hypoxia (HCC)   Sepsis due to pneumonia Bradley [name])   Essential hypertension   Non-small cell lung cancer (NSCLC) (HCC)   Lactic acidosis  Sepsis due to CAP- RLL superimposed on severe emphysema. Acute on chronic respiratory failure with hypoxia (HCC) due to pneumonia, COPD post cefepime and azithromycin in the ER.  BxCx NGTD. Neg flu, covid, rsv. Has been able to wean from 40L 45% to 30L 35% today. Denies SOB at rest.  Follow cultures ceftriaxone and azithromycin started 1/30 Continue with high flow nasal oxygen, wean as tolerated DuoNebs scheduled, PRN Continue home Dulera, montelukast  PT/OT once improving  Non-small cell lung cancer (NSCLC) ([place]) radiation therapy at the [facility] cancer center.  No systemic meds   Essential hypertension Patient's blood  pressure is borderline holding the patient's antihypertensives Follow VS and restart meds as able   Lactic acidosis- resolved. LA 2.5>2.1>1.7 Trending down Monitor as needed Treat underlying conditions   DVT prophylaxis: lovenox Pertinent IV fluids/nutrition: no continuous fluids at this time  Central lines / invasive devices: none  Code Status: FULL CODE  Current Admission Status: inpatient  TOC needs / Dispo plan: none Barriers to discharge / significant pending items: O2 reqirement and IV abx pending cultures   Subjective / Brief ROS:  Patient reports feeling improved. No dyspnea at rest. States that he has not been up and moving much. Continues to have mild cough. No other complaints or concerns at this time.   Family Communication: none at bedside  Objective Findings:  Vitals:   04/02/22 2316 04/03/22 0218 04/03/22 0301 04/03/22 0738  BP: 108/61  (!) 112/58 120/69  Pulse: (!) 102  (!) 104 93  Resp: 20  18 18   Temp: 97.8 F (36.6 C)  97.6 F (36.4 C) 97.8 F (36.6 C)  TempSrc:      SpO2: 94% 94% 94% 97%  Weight:      Height:        Intake/Output Summary (Last 24 hours) at 04/03/2022 0801 Last data filed at 04/03/2022 0738 Gross per 24 hour  Intake 239 ml  Output 2500 ml  Net -2261 ml   Filed Weights   04/01/22 2206  Weight: 72.1 kg    Examination:  Physical Exam Constitutional:  General: He is not in acute distress.    Appearance: He is not ill-appearing, toxic-appearing or diaphoretic.  Cardiovascular:     Rate and Rhythm: Normal rate and regular rhythm.     Heart sounds: Normal heart sounds.  Pulmonary:     Effort: Pulmonary effort is normal. No respiratory distress.     Breath sounds: Decreased air movement present. Examination of the right-lower field reveals decreased breath sounds. Examination of the left-lower field reveals decreased breath sounds. Decreased breath sounds present.  Abdominal:     General: Abdomen is flat. Bowel sounds are  normal.     Palpations: Abdomen is soft.  Musculoskeletal:     Right lower leg: No edema.     Left lower leg: No edema.  Skin:    General: Skin is warm and dry.  Neurological:     General: No focal deficit present.     Mental Status: He is alert and oriented to person, place, and time.     Cranial Nerves: No cranial nerve deficit.  Psychiatric:        Mood and Affect: Mood normal.        Behavior: Behavior normal.        Thought Content: Thought content normal.        Judgment: Judgment normal.    Scheduled Medications:   aspirin EC  81 mg Oral QPM   brimonidine  1 drop Both Eyes BID   enoxaparin (LOVENOX) injection  40 mg Subcutaneous Q24H   famotidine  20 mg Oral Daily   insulin aspart  0-15 Units Subcutaneous TID WC   insulin aspart  0-5 Units Subcutaneous QHS   ipratropium-albuterol  3 mL Nebulization Q6H   mometasone-formoterol  2 puff Inhalation BID   montelukast  10 mg Oral QHS   predniSONE  50 mg Oral Q breakfast   rosuvastatin  40 mg Oral BH-q7a   sodium chloride flush  3 mL Intravenous Q12H    Continuous Infusions:  azithromycin 250 mg (04/02/22 2130)   cefTRIAXone (ROCEPHIN)  IV Stopped (04/02/22 1142)    PRN Medications:  acetaminophen **OR** acetaminophen, albuterol, sodium chloride  Antimicrobials from admission:  Anti-infectives (From admission, onward)    Start     Dose/Rate Route Frequency Ordered Stop   04/02/22 2200  azithromycin (ZITHROMAX) 250 mg in dextrose 5 % 125 mL IVPB        250 mg 127.5 mL/hr over 60 Minutes Intravenous Every 24 hours 04/02/22 0931     04/02/22 1130  azithromycin (ZITHROMAX) 500 mg in sodium chloride 0.9 % 250 mL IVPB  Status:  Discontinued        500 mg 250 mL/hr over 60 Minutes Intravenous Every 24 hours 04/02/22 1116 04/02/22 1117   04/02/22 1130  cefTRIAXone (ROCEPHIN) 2 g in sodium chloride 0.9 % 100 mL IVPB  Status:  Discontinued        2 g 200 mL/hr over 30 Minutes Intravenous Every 24 hours 04/02/22 1116  04/02/22 1117   04/02/22 1000  cefTRIAXone (ROCEPHIN) 2 g in sodium chloride 0.9 % 100 mL IVPB        2 g 200 mL/hr over 30 Minutes Intravenous Every 24 hours 04/02/22 0931     04/01/22 2300  ceFEPIme (MAXIPIME) 2 g in sodium chloride 0.9 % 100 mL IVPB        2 g 200 mL/hr over 30 Minutes Intravenous  Once 04/01/22 2249 04/02/22 0030   04/01/22 2300  azithromycin (ZITHROMAX) 500 mg  in sodium chloride 0.9 % 250 mL IVPB        500 mg 250 mL/hr over 60 Minutes Intravenous  Once 04/01/22 2249 04/02/22 0031      Data Reviewed:  I have personally reviewed the following.  CBC: Recent Labs  Lab 04/01/22 2208 04/03/22 0221  WBC 15.2* 12.5*  NEUTROABS 12.1*  --   HGB 13.7 12.2*  HCT 44.1 37.9*  MCV 90.7 88.8  PLT 226 161    Basic Metabolic Panel: Recent Labs  Lab 04/01/22 2208 04/02/22 1123 04/03/22 0221  NA 138 137 135  K 4.1 4.1 4.4  CL 103 105 104  CO2 24 18* 24  GLUCOSE 178* 235* 363*  BUN 16 13 18   CREATININE 1.01 0.93 1.08  CALCIUM 9.0 8.6* 8.8*    GFR: Estimated Creatinine Clearance: 57.8 mL/min (by C-G formula based on SCr of 1.08 mg/dL). Liver Function Tests: Recent Labs  Lab 04/01/22 2208  AST 22  ALT 15  ALKPHOS 67  BILITOT 1.1  PROT 7.1  ALBUMIN 3.2*    Coagulation Profile: Recent Labs  Lab 04/02/22 1123  INR 1.1    Recent Labs    04/02/22 1123  HGBA1C 7.5*    CBG: Recent Labs  Lab 04/02/22 1207 04/02/22 1654 04/02/22 2024 04/03/22 0743  GLUCAP 224* 240* 196* 307*    Most Recent Urinalysis On File:     Component Value Date/Time   COLORURINE YELLOW (A) 03/01/2021 1938   APPEARANCEUR CLOUDY (A) 03/01/2021 1938   LABSPEC 1.016 03/01/2021 1938   PHURINE 5.0 03/01/2021 1938   GLUCOSEU >=500 (A) 03/01/2021 1938   HGBUR SMALL (A) 03/01/2021 1938   BILIRUBINUR NEGATIVE 03/01/2021 1938   KETONESUR NEGATIVE 03/01/2021 1938   PROTEINUR 100 (A) 03/01/2021 1938   NITRITE NEGATIVE 03/01/2021 1938   LEUKOCYTESUR NEGATIVE 03/01/2021  1938   Sepsis Labs: @LABRCNTIP (procalcitonin:4,lacticidven:4) Microbiology: Recent Results (from the past 240 hour(s))  Blood culture (routine x 2)     Status: None (Preliminary result)   Collection Time: 04/01/22 10:08 PM   Specimen: BLOOD  Result Value Ref Range Status   Specimen Description BLOOD RIGHT FA  Final   Special Requests   Final    BOTTLES DRAWN AEROBIC AND ANAEROBIC Blood Culture results may not be optimal due to an inadequate volume of blood received in culture bottles   Culture   Final    NO GROWTH 2 DAYS Performed at [facility], [address] [place], [place] [address]    Report Status PENDING  Incomplete  Resp panel by RT-PCR (RSV, Flu A&B, Covid) Anterior Nasal Swab     Status: None   Collection Time: 04/01/22 10:14 PM   Specimen: Anterior Nasal Swab  Result Value Ref Range Status   SARS Coronavirus 2 by RT PCR NEGATIVE NEGATIVE Final    Comment: (NOTE) SARS-CoV-2 target nucleic acids are NOT DETECTED.  The SARS-CoV-2 RNA is generally detectable in upper respiratory specimens during the acute phase of infection. The lowest concentration of SARS-CoV-2 viral copies this assay can detect is 138 copies/mL. A negative result does not preclude SARS-Cov-2 infection and should not be used as the sole basis for treatment or other patient management decisions. A negative result may occur with  improper specimen collection/handling, submission of specimen other than nasopharyngeal swab, presence of viral mutation(s) within the areas targeted by this assay, and inadequate number of viral copies(<138 copies/mL). A negative result must be combined with clinical observations, patient history, and epidemiological information. The  expected result is Negative.  Fact Sheet for Patients:  EntrepreneurPulse.com.au  Fact Sheet for Healthcare Providers:  IncredibleEmployment.be  This test is no t yet approved or cleared by the  [place] FDA and  has been authorized for detection and/or diagnosis of SARS-CoV-2 by FDA under an Emergency Use Authorization (EUA). This EUA will remain  in effect (meaning this test can be used) for the duration of the COVID-19 declaration under Section 564(b)(1) of the Act, 21 U.S.C.section 360bbb-3(b)(1), unless the authorization is terminated  or revoked sooner.       Influenza A by PCR NEGATIVE NEGATIVE Final   Influenza B by PCR NEGATIVE NEGATIVE Final    Comment: (NOTE) The Xpert Xpress SARS-CoV-2/FLU/RSV plus assay is intended as an aid in the diagnosis of influenza from Nasopharyngeal swab specimens and should not be used as a sole basis for treatment. Nasal washings and aspirates are unacceptable for Xpert Xpress SARS-CoV-2/FLU/RSV testing.  Fact Sheet for Patients: EntrepreneurPulse.com.au  Fact Sheet for Healthcare Providers: IncredibleEmployment.be  This test is not yet approved or cleared by the [place] FDA and has been authorized for detection and/or diagnosis of SARS-CoV-2 by FDA under an Emergency Use Authorization (EUA). This EUA will remain in effect (meaning this test can be used) for the duration of the COVID-19 declaration under Section 564(b)(1) of the Act, 21 U.S.C. section 360bbb-3(b)(1), unless the authorization is terminated or revoked.     Resp Syncytial Virus by PCR NEGATIVE NEGATIVE Final    Comment: (NOTE) Fact Sheet for Patients: EntrepreneurPulse.com.au  Fact Sheet for Healthcare Providers: IncredibleEmployment.be  This test is not yet approved or cleared by the [place] FDA and has been authorized for detection and/or diagnosis of SARS-CoV-2 by FDA under an Emergency Use Authorization (EUA). This EUA will remain in effect (meaning this test can be used) for the duration of the COVID-19 declaration under Section 564(b)(1) of the Act, 21 U.S.C. section  360bbb-3(b)(1), unless the authorization is terminated or revoked.  Performed at [name], [address] [place], [place] [address]   Blood culture (routine x 2)     Status: None (Preliminary result)   Collection Time: 04/02/22  9:34 PM   Specimen: BLOOD RIGHT ARM  Result Value Ref Range Status   Specimen Description BLOOD RIGHT ARM  Final   Special Requests   Final    BOTTLES DRAWN AEROBIC AND ANAEROBIC Blood Culture adequate volume   Culture   Final    NO GROWTH < 12 HOURS Performed at [facility], [address] [place], [place] [address]    Report Status PENDING  Incomplete      Radiology Studies last 3 days: CT ABDOMEN PELVIS W CONTRAST  Result Date: 04/01/2022 CLINICAL DATA:  Upper abdominal pain EXAM: CT ABDOMEN AND PELVIS WITH CONTRAST TECHNIQUE: Multidetector CT imaging of the abdomen and pelvis was performed using the standard protocol following bolus administration of intravenous contrast. RADIATION DOSE REDUCTION: This exam was performed according to the departmental dose-optimization program which includes automated exposure control, adjustment of the mA and/or kV according to patient size and/or use of iterative reconstruction technique. CONTRAST:  177mL OMNIPAQUE IOHEXOL 300 MG/ML  SOLN COMPARISON:  Partial comparison to CTA chest dated 02/18/2022. PET-CT dated 08/28/2021. CT abdomen/pelvis dated 02/05/2021. FINDINGS: Lower chest: Focal patchy opacities in the medial right lower lobe (series 4/image 27), new/progressive from recent CTA chest, likely reflecting radiation changes in this patient on SBRT. Extensive centrilobular and paraseptal emphysematous changes in the visualized  lungs. Known pulmonary nodules are less conspicuous on the current study. Hepatobiliary: Liver is within normal limits. Gallbladder is unremarkable. No intrahepatic or extrahepatic ductal dilatation. Pancreas: Within normal limits. Spleen: Within normal limits.  Adrenals/Urinary Tract: Adrenal glands are within normal limits. Right kidney is within normal limits. Multiple simple left renal cysts measuring up to 3.7 cm in the left lower kidney (series 2/image 40), benign (Bosniak I). No follow-up is recommended. No hydronephrosis. Mildly thick-walled bladder, chronic, likely reflecting chronic bladder outlet obstruction. Stomach/Bowel: Stomach is within normal limits. No evidence of bowel obstruction. Normal appendix (series 2/image 49). Sigmoid diverticulosis, without evidence of diverticulitis. Vascular/Lymphatic: No evidence of abdominal aortic aneurysm. Atherosclerotic calcifications of the abdominal aorta and branch vessels. No suspicious abdominopelvic lymphadenopathy. Reproductive: Prostatomegaly, with mild margin of the central gland indenting the base of the bladder, suggesting BPH. Other: No abdominopelvic ascites. Mild fat in the bilateral inguinal canals. Musculoskeletal: Mild degenerative changes at L5-S1. IMPRESSION: No CT findings to account for the patient's upper abdominal pain. Focal patchy opacities in the medial right lower lobe, new/progressive from recent CTA chest, likely reflecting radiation changes in this patient on SBRT. Additional ancillary findings as above. Aortic Atherosclerosis (ICD10-I70.0) and Emphysema (ICD10-J43.9). Electronically Signed   By: Julian Hy M.D.   On: 04/01/2022 23:46   DG Chest Portable 1 View  Result Date: 04/01/2022 CLINICAL DATA:  Shortness of breath EXAM: PORTABLE CHEST 1 VIEW COMPARISON:  CTA chest 02/18/2022 and radiographs 02/17/2022 FINDINGS: Stable cardiomediastinal silhouette. Aortic atherosclerotic calcification. Hyperinflation and chronic bronchitic changes. Small right pleural effusion or pleural thickening. New focal airspace opacity in the right lower lung medially. Pleural-parenchymal scarring in the apices. IMPRESSION: Findings suspicious for right lower lung pneumonia superimposed on severe  emphysema. Electronically Signed   By: Placido Sou M.D.   On: 04/01/2022 22:31      LOS: 1 day    Time spent: 35 min    Richarda Osmond, DO [facility] 04/03/2022, 8:01 AM   Staff may message me via secure chat in [place]  but this may not receive an immediate response,  please page me for urgent matters!  If 7PM-7AM, please contact night coverage [email or web address]

## 2022-04-03 NOTE — Progress Notes (Signed)
Inpatient Diabetes Program Recommendations  AACE/ADA: New Consensus Statement on Inpatient Glycemic Control (2015)  Target Ranges:  Prepandial:   less than 140 mg/dL      Peak postprandial:   less than 180 mg/dL (1-2 hours)      Critically ill patients:  140 - 180 mg/dL   Lab Results  Component Value Date   GLUCAP 307 (H) 04/03/2022   HGBA1C 7.5 (H) 04/02/2022    Review of Glycemic Control  Latest Reference Range & Units 04/02/22 12:07 04/02/22 16:54 04/02/22 20:24 04/03/22 07:43  Glucose-Capillary 70 - 99 mg/dL 224 (H) 240 (H) 196 (H) 307 (H)   Diabetes history: DM 2 Outpatient Diabetes medications:  Tradjenta 5 mg daily Metformin-XR 750 mg bid Current orders for Inpatient glycemic control:  Novolog 0-15 units tid with meals and HS Prednisone 50 mg daily Inpatient Diabetes Program Recommendations:    While on steroids, consider adding Levemir 8 units bid.  A1C is 7.5%, so it does not appear that he needs insulin at home.    Thanks,  Adah Perl, RN, BC-ADM Inpatient Diabetes Coordinator Pager [phone]  (8a-5p)

## 2022-04-04 DIAGNOSIS — J189 Pneumonia, unspecified organism: Secondary | ICD-10-CM | POA: Diagnosis not present

## 2022-04-04 DIAGNOSIS — R6520 Severe sepsis without septic shock: Secondary | ICD-10-CM | POA: Diagnosis not present

## 2022-04-04 DIAGNOSIS — J9621 Acute and chronic respiratory failure with hypoxia: Secondary | ICD-10-CM | POA: Diagnosis not present

## 2022-04-04 DIAGNOSIS — A419 Sepsis, unspecified organism: Secondary | ICD-10-CM | POA: Diagnosis not present

## 2022-04-04 LAB — CBC
HCT: 38 % — ABNORMAL LOW (ref 39.0–52.0)
Hemoglobin: 12.3 g/dL — ABNORMAL LOW (ref 13.0–17.0)
MCH: 28.7 pg (ref 26.0–34.0)
MCHC: 32.4 g/dL (ref 30.0–36.0)
MCV: 88.6 fL (ref 80.0–100.0)
Platelets: 231 10*3/uL (ref 150–400)
RBC: 4.29 MIL/uL (ref 4.22–5.81)
RDW: 15.3 % (ref 11.5–15.5)
WBC: 12.6 10*3/uL — ABNORMAL HIGH (ref 4.0–10.5)
nRBC: 0 % (ref 0.0–0.2)

## 2022-04-04 LAB — BASIC METABOLIC PANEL
Anion gap: 6 (ref 5–15)
BUN: 22 mg/dL (ref 8–23)
CO2: 24 mmol/L (ref 22–32)
Calcium: 8.7 mg/dL — ABNORMAL LOW (ref 8.9–10.3)
Chloride: 108 mmol/L (ref 98–111)
Creatinine, Ser: 0.99 mg/dL (ref 0.61–1.24)
GFR, Estimated: >60 mL/min (ref >60)
Glucose, Bld: 286 mg/dL — ABNORMAL HIGH (ref 70–99)
Potassium: 3.5 mmol/L (ref 3.5–5.1)
Sodium: 138 mmol/L (ref 135–145)

## 2022-04-04 LAB — GLUCOSE, CAPILLARY
Glucose-Capillary: 278 mg/dL — ABNORMAL HIGH (ref 70–99)
Glucose-Capillary: 156 mg/dL — ABNORMAL HIGH (ref 70–99)
Glucose-Capillary: 227 mg/dL — ABNORMAL HIGH (ref 70–99)
Glucose-Capillary: 286 mg/dL — ABNORMAL HIGH (ref 70–99)

## 2022-04-04 MED ORDER — INSULIN GLARGINE-YFGN 100 UNIT/ML ~~LOC~~ SOLN
10.0000 [IU] | Freq: Every day | SUBCUTANEOUS | Status: DC
  Administered 2022-04-04 – 2022-04-06 (×3): 10 [IU] via SUBCUTANEOUS
  Filled 2022-04-04 (×4): qty 0.1

## 2022-04-04 NOTE — Plan of Care (Signed)

## 2022-04-04 NOTE — TOC Initial Note (Signed)
Transition of Care [facility]) - Initial/Assessment Note    Patient Details  Name: Bradley [name] MRN: [id number] Date of Birth: Feb 14, 1951  [facility] CM/SW Contact:    Laurena Slimmer, RN Phone Number: 04/04/2022, 3:05 PM  Clinical Narrative:                   Admitted for: PNA and COPD Admitted from: Lives in a [place] bedside his grandson's camper PCP: Dr. Armanda Magic Transportation: children  [facility] Current home health/prior home health/DME: "I have every thing."  Patient stated he stopped using alcohol two days ago. He is agreeable to SA resources.  SA resources added to AVS.         Patient Goals and CMS Choice            Expected Discharge Plan and Services                                              Prior Living Arrangements/Services                       Activities of Daily Living Home Assistive Devices/Equipment: Eyeglasses, Wheelchair, Oxygen ADL Screening (condition at time of admission) Patient's cognitive ability adequate to safely complete daily activities?: Yes Is the patient deaf or have difficulty hearing?: No Does the patient have difficulty seeing, even when wearing glasses/contacts?: No Does the patient have difficulty concentrating, remembering, or making decisions?: No Patient able to express need for assistance with ADLs?: Yes Does the patient have difficulty dressing or bathing?: No Independently performs ADLs?: Yes (appropriate for developmental age) Does the patient have difficulty walking or climbing stairs?: No Weakness of Legs: None Weakness of Arms/Hands: None  Permission Sought/Granted                  Emotional Assessment              Admission diagnosis:  Pneumonia [J18.9] Acute on chronic respiratory failure with hypoxia ([place]) [J96.21] Community acquired pneumonia of right lower lobe of lung [J18.9] Sepsis with acute hypoxic respiratory failure, due to  unspecified organism, unspecified whether septic shock present ([place]) [A41.9, R65.20, J96.01] Patient Active Problem List   Diagnosis Date Noted   Lactic acidosis 04/02/2022   Type II diabetes mellitus with renal manifestations ([place]) 02/17/2022   Chronic kidney disease, stage 3a ([place]) 02/17/2022   CAD (coronary artery disease) 02/17/2022   Non-small cell lung cancer (NSCLC) ([place]) 02/17/2022   Nausea vomiting and diarrhea 02/17/2022   Alcohol abuse 02/17/2022   Malnutrition of moderate degree 07/11/2021   Sepsis due to pneumonia ([place]) 07/10/2021   Type 2 diabetes mellitus without complications ([place]) 67/02/4579   Dyslipidemia 07/10/2021   Essential hypertension 07/10/2021   Multifocal pneumonia 03/02/2021   Sepsis ([place]) 02/05/2021   History of tobacco use disorder 02/05/2021   Alcohol use disorder, moderate, dependence ([place]) 02/05/2021   Acute on chronic respiratory failure ([place]) 07/30/2020   Acute on chronic respiratory failure with hypoxia ([place]) 07/29/2020   CKD (chronic kidney disease) stage 3, GFR 30-59 ml/min ([place]) 07/29/2020   Hypertension associated with diabetes ([place]) 07/29/2020   Hyperlipidemia associated with type 2 diabetes mellitus ([place]) 07/29/2020   COVID-19 virus infection 07/29/2020   Callus 07/13/2020   Pain due to onychomycosis of toenails of both feet 01/06/2020  Type 2 diabetes mellitus with vascular disease ([place]) 01/06/2020   Chronic renal failure, stage 2 (mild) 12/01/2019   Lightheadedness 11/16/2019   Acute kidney injury ([place]) 04/20/2019   PAD (peripheral artery disease) ([place]) 04/20/2019   Tachycardia 04/20/2019   B12 deficiency 12/07/2017   OSA (obstructive sleep apnea) 11/28/2017   Lower extremity pain, left 03/06/2017   Numbness and tingling of left leg 03/06/2017   Coronary artery disease involving native coronary artery of native heart without angina pectoris 02/18/2017   Diastasis recti 02/18/2017   CVD (cardiovascular disease) 02/18/2017   Need for  vaccination 09/73/5329   Umbilical hernia without obstruction and without gangrene 02/18/2017   Hemoglobinuria 08/21/2016   Anemia 08/07/2016   Pneumonia 07/15/2016   Lung mass 06/28/2016   History of cigarette smoking 06/19/2016   Screening for malignant neoplasm of respiratory organ 06/19/2016   Acute respiratory failure with hypoxia ([place]) 12/27/2015   Community acquired pneumonia 12/27/2015   Acute respiratory distress 09/19/2015   Adjustment disorder with mixed anxiety and depressed mood 04/14/2015   Insomnia 04/14/2015   Grief 04/14/2015   Hyponatremia 04/12/2015   CAP (community acquired pneumonia) 10/08/2014   Hypogammaglobulinemia ([place]) 08/17/2014   Leukocytosis 08/03/2014   Recurrent infections 08/03/2014   Tinnitus of right ear 03/03/2014   Left carotid artery stenosis 07/21/2013   HLD (hyperlipidemia) 10/05/2010   Onychomycosis 10/05/2010   PCP:  Valera Castle, MD Pharmacy:   [facility] [address] [place] [address] Phone: [phone] Fax: [phone]     Social Determinants of Health (SDOH) Social History: SDOH Screenings   Food Insecurity: No Food Insecurity (04/02/2022)  Housing: Low Risk  (04/02/2022)  Transportation Needs: No Transportation Needs (04/02/2022)  Utilities: Not At Risk (04/02/2022)  Depression (PHQ2-9): Low Risk  (07/13/2018)  Tobacco Use: Medium Risk (02/18/2022)   SDOH Interventions:     Readmission Risk Interventions    02/07/2021    3:13 PM 07/31/2020   10:04 AM  Readmission Risk Prevention Plan  Transportation Screening Complete Complete  PCP or Specialist Appt within 3-5 Days Complete Complete  HRI or Home Care Consult Complete Complete  Social Work Consult for [place] Planning/Counseling Complete Not Complete  SW consult not completed comments  RNCM assigned to patient  Palliative Care Screening Not Applicable Not Applicable  Medication Review Human resources officer) Complete Complete

## 2022-04-04 NOTE — Progress Notes (Signed)
PROGRESS NOTE  Bradley [name]   OIN:[id number] DOB: [birth year]  DOA: 04/01/2022 Date of Service: 04/04/22 PCP: Valera Castle, MD  Brief Narrative / Hospital Course:  Bradley [name] is a 72 y.o. male with medical history significant of COPD with baseline use of 4 L/min of supplemental oxygen at home.  In spite of this patient does report that activities of daily living do cause him some dyspnea.  However over the last 3 days patient states that he has had a new onset of cough with production of brown sputum associated with worsening dyspnea.  Today he said that he was short of breath at rest and feeling nauseous and threw up about 3 times.  Nonbloody.  Patient reports a temperature of 100 F at home.  01/29: (+)CXR PNA. Azithromycin, cefepime, 1L fluids in ED.  01/30: Admitted to [facility] for sepsis d/t pneumonia and acute on chronic hypoxic resp fail requiring HFNC O2. Tx fluids 1L LR, continue on azithro + ceftriaxone. Added steroids x5 day course po prednisone  1/31-2/1: very gradual improvement in oxygen supplement required. Clinically improved.   Consultants:  none  Procedures: none  ASSESSMENT & PLAN:   Principal Problem:   Pneumonia Active Problems:   Acute on chronic respiratory failure with hypoxia (HCC)   Sepsis due to pneumonia [facility])   Essential hypertension   Non-small cell lung cancer (NSCLC) (HCC)   Lactic acidosis  Sepsis due to CAP- RLL superimposed on severe emphysema. Acute on chronic respiratory failure with hypoxia (HCC) due to pneumonia, COPD post cefepime and azithromycin in the ER.  BxCx NGTD. Neg flu, covid, rsv. Has been able to wean from 40L 45% to 30L 35% to 10L today. Denies SOB at rest. Having some SOB while walking.  Follow cultures ceftriaxone and azithromycin started 1/30 Continue with high flow nasal oxygen, wean as tolerated DuoNebs scheduled, PRN Continue home Dulera, montelukast  PT/OT once improving  Non-small cell lung cancer (NSCLC)  ([place]) radiation therapy at the [facility] cancer center.  No systemic meds   Essential hypertension- wnl holding the patient's antihypertensives Follow VS and restart meds as able   Lactic acidosis- resolved. LA 2.5>2.1>1.7 Monitor as needed Treat underlying conditions   DVT prophylaxis: lovenox Pertinent IV fluids/nutrition: no continuous fluids at this time  Central lines / invasive devices: none  Code Status: FULL CODE  Current Admission Status: inpatient  TOC needs / Dispo plan: none Barriers to discharge / significant pending items: O2 reqirement and IV abx pending cultures   Subjective / Brief ROS:  Patient reports feeling overall improved. No dyspnea at rest. Still having SOB with exertion.   Family Communication: none at bedside  Objective Findings:  Vitals:   04/03/22 1952 04/03/22 2326 04/04/22 0221 04/04/22 0300  BP:  111/67  109/63  Pulse:  100  (!) 101  Resp:  20  20  Temp:  97.7 F (36.5 C)  (!) 97.4 F (36.3 C)  TempSrc:  Oral  Oral  SpO2: 92% 90% 91% 90%  Weight:      Height:        Intake/Output Summary (Last 24 hours) at 04/04/2022 0750 Last data filed at 04/03/2022 2330 Gross per 24 hour  Intake 600 ml  Output 2550 ml  Net -1950 ml    Filed Weights   04/01/22 2206  Weight: 72.1 kg    Examination:  Physical Exam Constitutional:      General: He is not in acute distress.    Appearance:  He is not ill-appearing, toxic-appearing or diaphoretic.  Cardiovascular:     Rate and Rhythm: Normal rate and regular rhythm.     Heart sounds: Normal heart sounds.  Pulmonary:     Effort: Pulmonary effort is normal. No respiratory distress.     Breath sounds: No decreased air movement. No decreased breath sounds.  Abdominal:     General: Abdomen is flat. Bowel sounds are normal.     Palpations: Abdomen is soft.  Musculoskeletal:     Right lower leg: No edema.     Left lower leg: No edema.  Skin:    General: Skin is warm and dry.  Neurological:      General: No focal deficit present.     Mental Status: He is alert and oriented to person, place, and time.     Cranial Nerves: No cranial nerve deficit.  Psychiatric:        Mood and Affect: Mood normal.        Behavior: Behavior normal.        Thought Content: Thought content normal.        Judgment: Judgment normal.    Scheduled Medications:   aspirin EC  81 mg Oral QPM   azithromycin  250 mg Oral Daily   brimonidine  1 drop Both Eyes BID   enoxaparin (LOVENOX) injection  40 mg Subcutaneous Q24H   famotidine  20 mg Oral Daily   insulin aspart  0-15 Units Subcutaneous TID WC   insulin aspart  0-5 Units Subcutaneous QHS   ipratropium-albuterol  3 mL Nebulization Q6H   mometasone-formoterol  2 puff Inhalation BID   montelukast  10 mg Oral QHS   predniSONE  50 mg Oral Q breakfast   rosuvastatin  40 mg Oral BH-q7a   sodium chloride flush  3 mL Intravenous Q12H    Continuous Infusions:  cefTRIAXone (ROCEPHIN)  IV 2 g (04/03/22 0813)    PRN Medications:  acetaminophen **OR** acetaminophen, albuterol, sodium chloride  Antimicrobials from admission:  Anti-infectives (From admission, onward)    Start     Dose/Rate Route Frequency Ordered Stop   04/04/22 2200  azithromycin (ZITHROMAX) tablet 250 mg        250 mg Oral Daily 04/03/22 1411 04/06/22 2159   04/02/22 2200  azithromycin (ZITHROMAX) 250 mg in dextrose 5 % 125 mL IVPB        250 mg 127.5 mL/hr over 60 Minutes Intravenous Every 24 hours 04/02/22 0931 04/03/22 2229   04/02/22 1130  azithromycin (ZITHROMAX) 500 mg in sodium chloride 0.9 % 250 mL IVPB  Status:  Discontinued        500 mg 250 mL/hr over 60 Minutes Intravenous Every 24 hours 04/02/22 1116 04/02/22 1117   04/02/22 1130  cefTRIAXone (ROCEPHIN) 2 g in sodium chloride 0.9 % 100 mL IVPB  Status:  Discontinued        2 g 200 mL/hr over 30 Minutes Intravenous Every 24 hours 04/02/22 1116 04/02/22 1117   04/02/22 1000  cefTRIAXone (ROCEPHIN) 2 g in sodium chloride  0.9 % 100 mL IVPB        2 g 200 mL/hr over 30 Minutes Intravenous Every 24 hours 04/02/22 0931     04/01/22 2300  ceFEPIme (MAXIPIME) 2 g in sodium chloride 0.9 % 100 mL IVPB        2 g 200 mL/hr over 30 Minutes Intravenous  Once 04/01/22 2249 04/02/22 0030   04/01/22 2300  azithromycin (ZITHROMAX) 500 mg in sodium  chloride 0.9 % 250 mL IVPB        500 mg 250 mL/hr over 60 Minutes Intravenous  Once 04/01/22 2249 04/02/22 0031      Data Reviewed:  I have personally reviewed the following.  CBC: Recent Labs  Lab 04/01/22 2208 04/03/22 0221 04/04/22 0425  WBC 15.2* 12.5* 12.6*  NEUTROABS 12.1*  --   --   HGB 13.7 12.2* 12.3*  HCT 44.1 37.9* 38.0*  MCV 90.7 88.8 88.6  PLT 226 218 401    Basic Metabolic Panel: Recent Labs  Lab 04/01/22 2208 04/02/22 1123 04/03/22 0221 04/04/22 0425  NA 138 137 135 138  K 4.1 4.1 4.4 3.5  CL 103 105 104 108  CO2 24 18* 24 24  GLUCOSE 178* 235* 363* 286*  BUN 16 13 18 22   CREATININE 1.01 0.93 1.08 0.99  CALCIUM 9.0 8.6* 8.8* 8.7*    GFR: Estimated Creatinine Clearance: 63.1 mL/min (by C-G formula based on SCr of 0.99 mg/dL). Liver Function Tests: Recent Labs  Lab 04/01/22 2208  AST 22  ALT 15  ALKPHOS 67  BILITOT 1.1  PROT 7.1  ALBUMIN 3.2*    Coagulation Profile: Recent Labs  Lab 04/02/22 1123  INR 1.1    Recent Labs    04/02/22 1123  HGBA1C 7.5*    CBG: Recent Labs  Lab 04/02/22 2024 04/03/22 0743 04/03/22 1134 04/03/22 1627 04/03/22 2120  GLUCAP 196* 307* 214* 235* 222*    Most Recent Urinalysis On File:     Component Value Date/Time   COLORURINE YELLOW (A) 03/01/2021 1938   APPEARANCEUR CLOUDY (A) 03/01/2021 1938   LABSPEC 1.016 03/01/2021 1938   PHURINE 5.0 03/01/2021 1938   GLUCOSEU >=500 (A) 03/01/2021 1938   HGBUR SMALL (A) 03/01/2021 1938   BILIRUBINUR NEGATIVE 03/01/2021 1938   KETONESUR NEGATIVE 03/01/2021 1938   PROTEINUR 100 (A) 03/01/2021 1938   NITRITE NEGATIVE 03/01/2021 1938    LEUKOCYTESUR NEGATIVE 03/01/2021 1938   Sepsis Labs: @LABRCNTIP (procalcitonin:4,lacticidven:4) Microbiology: Recent Results (from the past 240 hour(s))  Blood culture (routine x 2)     Status: None (Preliminary result)   Collection Time: 04/01/22 10:08 PM   Specimen: BLOOD  Result Value Ref Range Status   Specimen Description BLOOD RIGHT FA  Final   Special Requests   Final    BOTTLES DRAWN AEROBIC AND ANAEROBIC Blood Culture results may not be optimal due to an inadequate volume of blood received in culture bottles   Culture   Final    NO GROWTH 2 DAYS Performed at [facility], [address], [place], [place] [address]    Report Status PENDING  Incomplete  Resp panel by RT-PCR (RSV, Flu A&B, Covid) Anterior Nasal Swab     Status: None   Collection Time: 04/01/22 10:14 PM   Specimen: Anterior Nasal Swab  Result Value Ref Range Status   SARS Coronavirus 2 by RT PCR NEGATIVE NEGATIVE Final    Comment: (NOTE) SARS-CoV-2 target nucleic acids are NOT DETECTED.  The SARS-CoV-2 RNA is generally detectable in upper respiratory specimens during the acute phase of infection. The lowest concentration of SARS-CoV-2 viral copies this assay can detect is 138 copies/mL. A negative result does not preclude SARS-Cov-2 infection and should not be used as the sole basis for treatment or other patient management decisions. A negative result may occur with  improper specimen collection/handling, submission of specimen other than nasopharyngeal swab, presence of viral mutation(s) within the areas targeted by this assay, and  inadequate number of viral copies(<138 copies/mL). A negative result must be combined with clinical observations, patient history, and epidemiological information. The expected result is Negative.  Fact Sheet for Patients:  EntrepreneurPulse.com.au  Fact Sheet for Healthcare Providers:  IncredibleEmployment.be  This test is no  t yet approved or cleared by the [place] FDA and  has been authorized for detection and/or diagnosis of SARS-CoV-2 by FDA under an Emergency Use Authorization (EUA). This EUA will remain  in effect (meaning this test can be used) for the duration of the COVID-19 declaration under Section 564(b)(1) of the Act, 21 U.S.C.section 360bbb-3(b)(1), unless the authorization is terminated  or revoked sooner.       Influenza A by PCR NEGATIVE NEGATIVE Final   Influenza B by PCR NEGATIVE NEGATIVE Final    Comment: (NOTE) The Xpert Xpress SARS-CoV-2/FLU/RSV plus assay is intended as an aid in the diagnosis of influenza from Nasopharyngeal swab specimens and should not be used as a sole basis for treatment. Nasal washings and aspirates are unacceptable for Xpert Xpress SARS-CoV-2/FLU/RSV testing.  Fact Sheet for Patients: EntrepreneurPulse.com.au  Fact Sheet for Healthcare Providers: IncredibleEmployment.be  This test is not yet approved or cleared by the [place] FDA and has been authorized for detection and/or diagnosis of SARS-CoV-2 by FDA under an Emergency Use Authorization (EUA). This EUA will remain in effect (meaning this test can be used) for the duration of the COVID-19 declaration under Section 564(b)(1) of the Act, 21 U.S.C. section 360bbb-3(b)(1), unless the authorization is terminated or revoked.     Resp Syncytial Virus by PCR NEGATIVE NEGATIVE Final    Comment: (NOTE) Fact Sheet for Patients: EntrepreneurPulse.com.au  Fact Sheet for Healthcare Providers: IncredibleEmployment.be  This test is not yet approved or cleared by the [place] FDA and has been authorized for detection and/or diagnosis of SARS-CoV-2 by FDA under an Emergency Use Authorization (EUA). This EUA will remain in effect (meaning this test can be used) for the duration of the COVID-19 declaration under Section  564(b)(1) of the Act, 21 U.S.C. section 360bbb-3(b)(1), unless the authorization is terminated or revoked.  Performed at [facility], [address] [place], [place] [address]   Blood culture (routine x 2)     Status: None (Preliminary result)   Collection Time: 04/02/22  9:34 PM   Specimen: BLOOD RIGHT ARM  Result Value Ref Range Status   Specimen Description BLOOD RIGHT ARM  Final   Special Requests   Final    BOTTLES DRAWN AEROBIC AND ANAEROBIC Blood Culture adequate volume   Culture   Final    NO GROWTH < 12 HOURS Performed at [facility], [address], [place], [place] [address]    Report Status PENDING  Incomplete      Radiology Studies last 3 days: CT ABDOMEN PELVIS W CONTRAST  Result Date: 04/01/2022 CLINICAL DATA:  Upper abdominal pain EXAM: CT ABDOMEN AND PELVIS WITH CONTRAST TECHNIQUE: Multidetector CT imaging of the abdomen and pelvis was performed using the standard protocol following bolus administration of intravenous contrast. RADIATION DOSE REDUCTION: This exam was performed according to the departmental dose-optimization program which includes automated exposure control, adjustment of the mA and/or kV according to patient size and/or use of iterative reconstruction technique. CONTRAST:  123mL OMNIPAQUE IOHEXOL 300 MG/ML  SOLN COMPARISON:  Partial comparison to CTA chest dated 02/18/2022. PET-CT dated 08/28/2021. CT abdomen/pelvis dated 02/05/2021. FINDINGS: Lower chest: Focal patchy opacities in the medial right lower lobe (series 4/image 27), new/progressive from  recent CTA chest, likely reflecting radiation changes in this patient on SBRT. Extensive centrilobular and paraseptal emphysematous changes in the visualized lungs. Known pulmonary nodules are less conspicuous on the current study. Hepatobiliary: Liver is within normal limits. Gallbladder is unremarkable. No intrahepatic or extrahepatic ductal dilatation. Pancreas: Within normal limits.  Spleen: Within normal limits. Adrenals/Urinary Tract: Adrenal glands are within normal limits. Right kidney is within normal limits. Multiple simple left renal cysts measuring up to 3.7 cm in the left lower kidney (series 2/image 40), benign (Bosniak I). No follow-up is recommended. No hydronephrosis. Mildly thick-walled bladder, chronic, likely reflecting chronic bladder outlet obstruction. Stomach/Bowel: Stomach is within normal limits. No evidence of bowel obstruction. Normal appendix (series 2/image 49). Sigmoid diverticulosis, without evidence of diverticulitis. Vascular/Lymphatic: No evidence of abdominal aortic aneurysm. Atherosclerotic calcifications of the abdominal aorta and branch vessels. No suspicious abdominopelvic lymphadenopathy. Reproductive: Prostatomegaly, with mild margin of the central gland indenting the base of the bladder, suggesting BPH. Other: No abdominopelvic ascites. Mild fat in the bilateral inguinal canals. Musculoskeletal: Mild degenerative changes at L5-S1. IMPRESSION: No CT findings to account for the patient's upper abdominal pain. Focal patchy opacities in the medial right lower lobe, new/progressive from recent CTA chest, likely reflecting radiation changes in this patient on SBRT. Additional ancillary findings as above. Aortic Atherosclerosis (ICD10-I70.0) and Emphysema (ICD10-J43.9). Electronically Signed   By: Julian Hy M.D.   On: 04/01/2022 23:46   DG Chest Portable 1 View  Result Date: 04/01/2022 CLINICAL DATA:  Shortness of breath EXAM: PORTABLE CHEST 1 VIEW COMPARISON:  CTA chest 02/18/2022 and radiographs 02/17/2022 FINDINGS: Stable cardiomediastinal silhouette. Aortic atherosclerotic calcification. Hyperinflation and chronic bronchitic changes. Small right pleural effusion or pleural thickening. New focal airspace opacity in the right lower lung medially. Pleural-parenchymal scarring in the apices. IMPRESSION: Findings suspicious for right lower lung  pneumonia superimposed on severe emphysema. Electronically Signed   By: Placido Sou M.D.   On: 04/01/2022 22:31      LOS: 2 days    Time spent: 35 min  Richarda Osmond, DO [facility] 04/04/2022, 7:50 AM   Staff may message me via secure chat in [place]  but this may not receive an immediate response,  please page me for urgent matters!  If 7PM-7AM, please contact night coverage [email or web address]

## 2022-04-05 DIAGNOSIS — J9621 Acute and chronic respiratory failure with hypoxia: Secondary | ICD-10-CM | POA: Diagnosis not present

## 2022-04-05 LAB — CBC
HCT: 40.9 % (ref 39.0–52.0)
Hemoglobin: 13.2 g/dL (ref 13.0–17.0)
MCH: 28.3 pg (ref 26.0–34.0)
MCHC: 32.3 g/dL (ref 30.0–36.0)
MCV: 87.8 fL (ref 80.0–100.0)
Platelets: 249 10*3/uL (ref 150–400)
RBC: 4.66 MIL/uL (ref 4.22–5.81)
RDW: 15.3 % (ref 11.5–15.5)
WBC: 10.6 10*3/uL — ABNORMAL HIGH (ref 4.0–10.5)
nRBC: 0 % (ref 0.0–0.2)

## 2022-04-05 LAB — GLUCOSE, CAPILLARY
Glucose-Capillary: 226 mg/dL — ABNORMAL HIGH (ref 70–99)
Glucose-Capillary: 277 mg/dL — ABNORMAL HIGH (ref 70–99)
Glucose-Capillary: 238 mg/dL — ABNORMAL HIGH (ref 70–99)
Glucose-Capillary: 344 mg/dL — ABNORMAL HIGH (ref 70–99)
Glucose-Capillary: 304 mg/dL — ABNORMAL HIGH (ref 70–99)

## 2022-04-05 LAB — BASIC METABOLIC PANEL
Anion gap: 5 (ref 5–15)
BUN: 26 mg/dL — ABNORMAL HIGH (ref 8–23)
CO2: 26 mmol/L (ref 22–32)
Calcium: 8.8 mg/dL — ABNORMAL LOW (ref 8.9–10.3)
Chloride: 102 mmol/L (ref 98–111)
Creatinine, Ser: 1.01 mg/dL (ref 0.61–1.24)
GFR, Estimated: >60 mL/min (ref >60)
Glucose, Bld: 290 mg/dL — ABNORMAL HIGH (ref 70–99)
Potassium: 3.9 mmol/L (ref 3.5–5.1)
Sodium: 133 mmol/L — ABNORMAL LOW (ref 135–145)

## 2022-04-05 MED ORDER — INSULIN ASPART 100 UNIT/ML IJ SOLN
0.0000 [IU] | Freq: Three times a day (TID) | INTRAMUSCULAR | Status: DC
  Administered 2022-04-05 – 2022-04-06 (×2): 11 [IU] via SUBCUTANEOUS
  Administered 2022-04-06: 5 [IU] via SUBCUTANEOUS
  Administered 2022-04-06: 3 [IU] via SUBCUTANEOUS
  Administered 2022-04-06: 8 [IU] via SUBCUTANEOUS
  Administered 2022-04-07: 3 [IU] via SUBCUTANEOUS
  Administered 2022-04-07: 2 [IU] via SUBCUTANEOUS
  Administered 2022-04-07: 5 [IU] via SUBCUTANEOUS
  Administered 2022-04-08: 2 [IU] via SUBCUTANEOUS
  Administered 2022-04-08: 3 [IU] via SUBCUTANEOUS
  Administered 2022-04-08: 8 [IU] via SUBCUTANEOUS
  Administered 2022-04-09 (×3): 3 [IU] via SUBCUTANEOUS
  Administered 2022-04-09: 5 [IU] via SUBCUTANEOUS
  Administered 2022-04-10 (×2): 3 [IU] via SUBCUTANEOUS
  Administered 2022-04-10 (×2): 5 [IU] via SUBCUTANEOUS
  Administered 2022-04-11: 3 [IU] via SUBCUTANEOUS
  Administered 2022-04-11: 5 [IU] via SUBCUTANEOUS
  Administered 2022-04-11 – 2022-04-12 (×2): 8 [IU] via SUBCUTANEOUS
  Administered 2022-04-12: 3 [IU] via SUBCUTANEOUS
  Filled 2022-04-05 (×22): qty 1

## 2022-04-05 MED ORDER — GUAIFENESIN ER 600 MG PO TB12
600.0000 mg | ORAL_TABLET | Freq: Two times a day (BID) | ORAL | Status: DC
  Administered 2022-04-05 – 2022-04-12 (×15): 600 mg via ORAL
  Filled 2022-04-05 (×15): qty 1

## 2022-04-05 NOTE — Progress Notes (Signed)
  Progress Note   Patient: Bradley [name] DOB: 09/24/1950 DOA: 04/01/2022     3 DOS: the patient was seen and examined on 04/05/2022   Brief hospital course:  Assessment and Plan: * Pneumonia - Proventil q6 hr PRN  - Duoneb q6 hr  - Dulera 2 puff bid  - Singulair 10 mg PO daily  - Prednisone 50 mg PO daily  - Mucinex 600 mg PO bid  - Azithromycin 250 mg PO daily  - IV ceftriaxone 2 g daily   Acute on chronic respiratory failure with hypoxia (HCC) - On 8L (baseline home O2 is 3.5L); titrate O2 as tolerated   Sepsis due to pneumonia ([place]) - Management as above   Essential hypertension - Monitor   DM - Novolog SS tid w meals  - Glargine 10 units sq daily  - ASA 81 mg PO daily  - Crestor 40 mg PO daily   Non-small cell lung cancer (NSCLC) ([place]) - Patient reports getting monthly radiation therapy at the [facility] cancer center.  Does not report being on systemic medications  Lactic acidosis - Resolved   DVT prophylaxis: Lovenox 40 mg sq daily  GI prophylaxis: Pepcid 20 mg PO daily       Subjective: Pt seen and examined at the bedside. He is still on 8L oxygen. His baseline O2 is 3.5L. He continues on IV antibx's. WBC is down trending 12.6 -->10.6.  Physical Exam: Vitals:   04/05/22 0500 04/05/22 0600 04/05/22 0736 04/05/22 0805  BP:   125/78   Pulse:   96   Resp: 17 20 18    Temp:   97.7 F (36.5 C)   TempSrc:   Oral   SpO2:   94% 94%  Weight:      Height:       Physical Exam Constitutional:      Appearance: Normal appearance.  HENT:     Head: Normocephalic and atraumatic.     Mouth/Throat:     Mouth: Mucous membranes are moist.  Cardiovascular:     Rate and Rhythm: Normal rate and regular rhythm.  Pulmonary:     Breath sounds: Rhonchi present.  Abdominal:     General: Abdomen is flat.     Palpations: Abdomen is soft.  Musculoskeletal:        General: Normal range of motion.     Cervical back: Neck supple.  Skin:    General: Skin is warm.   Neurological:     Mental Status: He is alert. Mental status is at baseline.  Psychiatric:        Mood and Affect: Mood normal.    Data Reviewed:   Disposition: Status is: Inpatient  Planned Discharge Destination: Home    Time spent: 35 minutes  Author: Lucienne Minks , MD 04/05/2022 10:14 AM  For on call review [email or web address].

## 2022-04-05 NOTE — Inpatient Diabetes Management (Signed)
Inpatient Diabetes Program Recommendations  AACE/ADA: New Consensus Statement on Inpatient Glycemic Control   Target Ranges:  Prepandial:   less than 140 mg/dL      Peak postprandial:   less than 180 mg/dL (1-2 hours)      Critically ill patients:  140 - 180 mg/dL    Latest Reference Range & Units 04/04/22 08:02 04/04/22 11:51 04/04/22 17:14 04/04/22 20:58 04/05/22 07:34  Glucose-Capillary 70 - 99 mg/dL 278 (H) 156 (H) 227 (H) 286 (H) 226 (H)   Review of Glycemic Control  Diabetes history: DM2 Outpatient Diabetes medications: Metformin XR 750 mg BID, Tradjenta 5 mg daily Current orders for Inpatient glycemic control: Semglee 10 units daily, Novolog 0-15 units TID with meals; Prednisone 50 mg daily  Inpatient Diabetes Program Recommendations:    Insulin: If steroids are continued, please consider increasing Semglee to 15 units daily, adding Novolog 0-5 units QHS, and ordering Novolog 3 units TID with meals for meal coverage if patient eats at least 50% of meals.  Thanks, Barnie Alderman, RN, MSN, [place] Diabetes Coordinator Inpatient Diabetes Program [phone] (Team Pager from 8am to [place])

## 2022-04-05 NOTE — Care Management Important Message (Signed)
Important Message  Patient Details  Name: MARQUIN PATINO MRN: [id number] Date of Birth: 30-Oct-1950   Medicare Important Message Given:  Yes     Dannette Barbara 04/05/2022, 11:33 AM

## 2022-04-06 DIAGNOSIS — J9621 Acute and chronic respiratory failure with hypoxia: Secondary | ICD-10-CM | POA: Diagnosis not present

## 2022-04-06 LAB — GLUCOSE, CAPILLARY
Glucose-Capillary: 197 mg/dL — ABNORMAL HIGH (ref 70–99)
Glucose-Capillary: 245 mg/dL — ABNORMAL HIGH (ref 70–99)
Glucose-Capillary: 238 mg/dL — ABNORMAL HIGH (ref 70–99)
Glucose-Capillary: 280 mg/dL — ABNORMAL HIGH (ref 70–99)

## 2022-04-06 LAB — CBC
HCT: 41.9 % (ref 39.0–52.0)
Hemoglobin: 13.7 g/dL (ref 13.0–17.0)
MCH: 28.5 pg (ref 26.0–34.0)
MCHC: 32.7 g/dL (ref 30.0–36.0)
MCV: 87.1 fL (ref 80.0–100.0)
Platelets: 283 10*3/uL (ref 150–400)
RBC: 4.81 MIL/uL (ref 4.22–5.81)
RDW: 15.2 % (ref 11.5–15.5)
WBC: 11.7 10*3/uL — ABNORMAL HIGH (ref 4.0–10.5)
nRBC: 0 % (ref 0.0–0.2)

## 2022-04-06 LAB — COMPREHENSIVE METABOLIC PANEL
ALT: 19 U/L (ref 0–44)
AST: 19 U/L (ref 15–41)
Albumin: 3.1 g/dL — ABNORMAL LOW (ref 3.5–5.0)
Alkaline Phosphatase: 55 U/L (ref 38–126)
Anion gap: 8 (ref 5–15)
BUN: 26 mg/dL — ABNORMAL HIGH (ref 8–23)
CO2: 24 mmol/L (ref 22–32)
Calcium: 8.8 mg/dL — ABNORMAL LOW (ref 8.9–10.3)
Chloride: 103 mmol/L (ref 98–111)
Creatinine, Ser: 0.87 mg/dL (ref 0.61–1.24)
GFR, Estimated: >60 mL/min (ref >60)
Glucose, Bld: 195 mg/dL — ABNORMAL HIGH (ref 70–99)
Potassium: 4.1 mmol/L (ref 3.5–5.1)
Sodium: 135 mmol/L (ref 135–145)
Total Bilirubin: 0.6 mg/dL (ref 0.3–1.2)
Total Protein: 6.4 g/dL — ABNORMAL LOW (ref 6.5–8.1)

## 2022-04-06 LAB — CULTURE, BLOOD (ROUTINE X 2): Culture: NO GROWTH

## 2022-04-06 LAB — MAGNESIUM: Magnesium: 2.3 mg/dL (ref 1.7–2.4)

## 2022-04-06 LAB — PHOSPHORUS: Phosphorus: 3.4 mg/dL (ref 2.5–4.6)

## 2022-04-06 LAB — C-REACTIVE PROTEIN: CRP: 0.7 mg/dL (ref <1.0)

## 2022-04-06 NOTE — Progress Notes (Signed)
  Progress Note   Patient: Bradley [name] SNK:[id number] DOB: 1950-10-13 DOA: 04/01/2022     4 DOS: the patient was seen and examined on 04/06/2022   Brief hospital course:  Assessment and Plan:  * Pneumonia - Proventil q6 hr PRN  - Duoneb q6 hr  - Dulera 2 puff bid  - Singulair 10 mg PO daily  - Prednisone 50 mg PO daily  - Mucinex 600 mg PO bid  - IV ceftriaxone 2 g daily    Acute on chronic respiratory failure with hypoxia (HCC) - On 7L (baseline home O2 is 3.5L); titrate O2 as tolerated    Sepsis due to pneumonia ([place]) - Management as above    Essential hypertension - Monitor    DM - Novolog SS tid w meals  - Glargine 10 units sq daily  - ASA 81 mg PO daily  - Crestor 40 mg PO daily    Non-small cell lung cancer (NSCLC) ([place]) - Patient reports getting monthly radiation therapy at the [facility] cancer center.  Does not report being on systemic medications   Lactic acidosis - Resolved    DVT prophylaxis: Lovenox 40 mg sq daily  GI prophylaxis: Pepcid 20 mg PO daily       Subjective: Pt seen and examined at the bedside. He is now on 7L oxygen. He reports feeling slightly better today. He will continue in the hospital for continued treatment. Titrate oxygen as tolerated.   Physical Exam: Vitals:   04/05/22 2353 04/06/22 0401 04/06/22 0738 04/06/22 0749  BP: 135/82 91/77 110/80   Pulse: (!) 103 96 98   Resp: 20  18   Temp: 97.7 F (36.5 C) 97.8 F (36.6 C) 97.8 F (36.6 C)   TempSrc:  Oral    SpO2: 96% 97% 100% 94%  Weight:      Height:       Constitutional:      Appearance: Normal appearance.  HENT:     Head: Normocephalic and atraumatic.     Mouth/Throat:     Mouth: Mucous membranes are moist.  Cardiovascular:     Rate and Rhythm: Normal rate and regular rhythm.  Pulmonary:     Breath sounds: Rhonchi present.  Abdominal:     General: Abdomen is flat.     Palpations: Abdomen is soft.  Musculoskeletal:        General: Normal range of motion.      Cervical back: Neck supple.  Skin:    General: Skin is warm.  Neurological:     Mental Status: He is alert. Mental status is at baseline.  Psychiatric:        Mood and Affect: Mood normal.   Data Reviewed:   Disposition: Status is: Inpatient  Planned Discharge Destination: Home    Time spent: 35 minutes  Author: Lucienne Minks , MD 04/06/2022 11:06 AM  For on call review [email or web address].

## 2022-04-07 DIAGNOSIS — J189 Pneumonia, unspecified organism: Secondary | ICD-10-CM | POA: Diagnosis not present

## 2022-04-07 DIAGNOSIS — A419 Sepsis, unspecified organism: Secondary | ICD-10-CM | POA: Diagnosis not present

## 2022-04-07 DIAGNOSIS — J9621 Acute and chronic respiratory failure with hypoxia: Secondary | ICD-10-CM | POA: Diagnosis not present

## 2022-04-07 DIAGNOSIS — C3491 Malignant neoplasm of unspecified part of right bronchus or lung: Secondary | ICD-10-CM | POA: Diagnosis not present

## 2022-04-07 LAB — MAGNESIUM: Magnesium: 2.2 mg/dL (ref 1.7–2.4)

## 2022-04-07 LAB — COMPREHENSIVE METABOLIC PANEL
ALT: 29 U/L (ref 0–44)
AST: 24 U/L (ref 15–41)
Albumin: 3 g/dL — ABNORMAL LOW (ref 3.5–5.0)
Alkaline Phosphatase: 59 U/L (ref 38–126)
Anion gap: 8 (ref 5–15)
BUN: 31 mg/dL — ABNORMAL HIGH (ref 8–23)
CO2: 24 mmol/L (ref 22–32)
Calcium: 8.8 mg/dL — ABNORMAL LOW (ref 8.9–10.3)
Chloride: 103 mmol/L (ref 98–111)
Creatinine, Ser: 1.05 mg/dL (ref 0.61–1.24)
GFR, Estimated: >60 mL/min (ref >60)
Glucose, Bld: 232 mg/dL — ABNORMAL HIGH (ref 70–99)
Potassium: 4 mmol/L (ref 3.5–5.1)
Sodium: 135 mmol/L (ref 135–145)
Total Bilirubin: 0.5 mg/dL (ref 0.3–1.2)
Total Protein: 6.3 g/dL — ABNORMAL LOW (ref 6.5–8.1)

## 2022-04-07 LAB — PHOSPHORUS: Phosphorus: 3.4 mg/dL (ref 2.5–4.6)

## 2022-04-07 LAB — CBC
HCT: 43.1 % (ref 39.0–52.0)
Hemoglobin: 14.1 g/dL (ref 13.0–17.0)
MCH: 28.8 pg (ref 26.0–34.0)
MCHC: 32.7 g/dL (ref 30.0–36.0)
MCV: 88 fL (ref 80.0–100.0)
Platelets: 297 10*3/uL (ref 150–400)
RBC: 4.9 MIL/uL (ref 4.22–5.81)
RDW: 15.4 % (ref 11.5–15.5)
WBC: 12 10*3/uL — ABNORMAL HIGH (ref 4.0–10.5)
nRBC: 0 % (ref 0.0–0.2)

## 2022-04-07 LAB — GLUCOSE, CAPILLARY
Glucose-Capillary: 312 mg/dL — ABNORMAL HIGH (ref 70–99)
Glucose-Capillary: 185 mg/dL — ABNORMAL HIGH (ref 70–99)
Glucose-Capillary: 112 mg/dL — ABNORMAL HIGH (ref 70–99)
Glucose-Capillary: 218 mg/dL — ABNORMAL HIGH (ref 70–99)
Glucose-Capillary: 137 mg/dL — ABNORMAL HIGH (ref 70–99)

## 2022-04-07 LAB — CULTURE, BLOOD (ROUTINE X 2)
Culture: NO GROWTH
Special Requests: ADEQUATE

## 2022-04-07 LAB — C-REACTIVE PROTEIN: CRP: 0.7 mg/dL (ref <1.0)

## 2022-04-07 MED ORDER — FLUTICASONE PROPIONATE 50 MCG/ACT NA SUSP
1.0000 | Freq: Every day | NASAL | Status: DC
  Administered 2022-04-07 – 2022-04-10 (×3): 1 via NASAL
  Filled 2022-04-07: qty 16

## 2022-04-07 MED ORDER — INSULIN GLARGINE-YFGN 100 UNIT/ML ~~LOC~~ SOLN
15.0000 [IU] | Freq: Every day | SUBCUTANEOUS | Status: DC
  Administered 2022-04-07 – 2022-04-12 (×6): 15 [IU] via SUBCUTANEOUS
  Filled 2022-04-07 (×6): qty 0.15

## 2022-04-07 NOTE — Progress Notes (Signed)
[facility] Progress Note  Patient: Bradley [name]    XYB:[id number]  DOA: 04/01/2022    Date of Service: the patient was seen and examined on 04/07/2022  Brief hospital course: 72 year old male with past medical history of COPD and hypertension plus non-small cell lung cancer getting monthly radiation therapy and chronic respiratory failure on 4 L nasal cannula who presented to the emergency room on 1/29 with complaints of several days of progressively worsening shortness of breath and productive cough with brown sputum.  In the emergency room, workup revealed patient with sepsis secondary to pneumonia and patient started on IV fluids and antibiotics.  Initially patient requiring high flow nasal oxygen as high as 40 L.  Over the next few days, he has gradually improved and is currently down to 6 L high flow nasal cannula.   Assessment and Plan: Severe sepsis secondary to pneumonia: Patient meets criteria with sepsis on admission given tachypnea, tachycardia, pneumonia source, respiratory failure.  Has responded well to antibiotics and oxygen able to be weaned down, currently on 6 L.  Acute on chronic respiratory failure with hypoxia: Continues to improve.  Baseline of 4 L nasal cannula.  History of non-small cell lung cancer on radiation therapy: Stable, not on any oral chemotherapy agents  Diabetes mellitus: Still with elevated blood sugars.  A1c notes moderate control at 7.5.  Titrating up Lantus.     Body mass index is 24.9 kg/m.        Consultants: None  Procedures: None  Antimicrobials: IV Rocephin and Zithromax: 1/30 - 2/5  Code Status: Full code   Subjective: Patient feels like breathing is slowly getting better  Objective: Mild tachycardia Vitals:   04/07/22 1231 04/07/22 1240  BP: 99/69   Pulse: (!) 112 (!) 103  Resp: 20   Temp: 98 F (36.7 C)   SpO2: 93%     Intake/Output Summary (Last 24 hours) at 04/07/2022 1333 Last data filed at 04/07/2022  0433 Gross per 24 hour  Intake --  Output 1000 ml  Net -1000 ml   Filed Weights   04/01/22 2206  Weight: 72.1 kg   Body mass index is 24.9 kg/m.  Exam:  General: Oriented x 3, alert HEENT: Normocephalic and atraumatic, mucous membranes are moist Cardiovascular: Regular rate and rhythm, S1-S2 Respiratory: Decreased breath sounds throughout with some mild end expiratory wheeze Abdomen: Soft, nontender, nondistended, positive bowel sounds Musculoskeletal: No clubbing or cyanosis or edema Skin: No skin breaks, tears or lesions Psychiatry: Appropriate, no evidence of psychoses Neurology: No focal deficits  Data Reviewed: White blood cell count of 12.0  Disposition:  Status is: Inpatient Remains inpatient appropriate because:  -Improvement in hypoxia    Anticipated discharge date: 2/6  Family Communication: Will call daughter DVT Prophylaxis: enoxaparin (LOVENOX) injection 40 mg Start: 04/02/22 2200 SCDs Start: 04/02/22 1117    Author: Annita Brod ,MD 04/07/2022 1:33 PM  To reach On-call, see care teams to locate the attending and reach out via [email or web address]. Between 7PM-7AM, please contact night-coverage If you still have difficulty reaching the attending provider, please page the [facility] (Director on Call) for [facility] on amion for assistance.

## 2022-04-08 ENCOUNTER — Encounter: Payer: Self-pay | Admitting: Internal Medicine

## 2022-04-08 DIAGNOSIS — J9621 Acute and chronic respiratory failure with hypoxia: Secondary | ICD-10-CM | POA: Diagnosis not present

## 2022-04-08 DIAGNOSIS — J189 Pneumonia, unspecified organism: Secondary | ICD-10-CM | POA: Diagnosis not present

## 2022-04-08 DIAGNOSIS — A419 Sepsis, unspecified organism: Secondary | ICD-10-CM | POA: Diagnosis not present

## 2022-04-08 DIAGNOSIS — I10 Essential (primary) hypertension: Secondary | ICD-10-CM | POA: Diagnosis not present

## 2022-04-08 LAB — GLUCOSE, CAPILLARY
Glucose-Capillary: 115 mg/dL — ABNORMAL HIGH (ref 70–99)
Glucose-Capillary: 256 mg/dL — ABNORMAL HIGH (ref 70–99)
Glucose-Capillary: 144 mg/dL — ABNORMAL HIGH (ref 70–99)
Glucose-Capillary: 154 mg/dL — ABNORMAL HIGH (ref 70–99)

## 2022-04-08 LAB — CBC
HCT: 46 % (ref 39.0–52.0)
Hemoglobin: 14.8 g/dL (ref 13.0–17.0)
MCH: 28.4 pg (ref 26.0–34.0)
MCHC: 32.2 g/dL (ref 30.0–36.0)
MCV: 88.3 fL (ref 80.0–100.0)
Platelets: 305 10*3/uL (ref 150–400)
RBC: 5.21 MIL/uL (ref 4.22–5.81)
RDW: 15.7 % — ABNORMAL HIGH (ref 11.5–15.5)
WBC: 14.1 10*3/uL — ABNORMAL HIGH (ref 4.0–10.5)
nRBC: 0 % (ref 0.0–0.2)

## 2022-04-08 LAB — PROCALCITONIN: Procalcitonin: <0.1 ng/mL

## 2022-04-08 LAB — BRAIN NATRIURETIC PEPTIDE: B Natriuretic Peptide: 25.6 pg/mL (ref 0.0–100.0)

## 2022-04-08 MED ORDER — METOPROLOL TARTRATE 50 MG PO TABS
50.0000 mg | ORAL_TABLET | Freq: Two times a day (BID) | ORAL | Status: DC
  Administered 2022-04-08 – 2022-04-12 (×9): 50 mg via ORAL
  Filled 2022-04-08 (×9): qty 1

## 2022-04-08 MED ORDER — GLUCERNA SHAKE PO LIQD
237.0000 mL | Freq: Three times a day (TID) | ORAL | Status: DC
  Administered 2022-04-08 – 2022-04-12 (×10): 237 mL via ORAL

## 2022-04-08 MED ORDER — ADULT MULTIVITAMIN W/MINERALS CH
1.0000 | ORAL_TABLET | Freq: Every day | ORAL | Status: DC
  Administered 2022-04-08 – 2022-04-12 (×5): 1 via ORAL
  Filled 2022-04-08 (×5): qty 1

## 2022-04-08 NOTE — Care Management Important Message (Signed)
Important Message  Patient Details  Name: Bradley [name] MRN: [id number] Date of Birth: 11/24/1950   Medicare Important Message Given:  Yes     Dannette Barbara 04/08/2022, 3:39 PM

## 2022-04-08 NOTE — Progress Notes (Signed)
1930 patient alert able x4 to make needs known on 5 [place]

## 2022-04-08 NOTE — TOC Progression Note (Signed)
Transition of Care [facility]) - Progression Note    Patient Details  Name: Bradley [name] MRN: [id number] Date of Birth: [birth year]  [facility]) CM/SW Contact  Laurena Slimmer, RN Phone Number: 04/08/2022, 12:17 PM  Clinical Narrative:    Case reviewed for needs and disposition.    Expected Discharge Plan: Home/Self Care Barriers to Discharge: Continued Medical Work up  Expected Discharge Plan and Services       Living arrangements for the past 2 months: Single Family Home                                       Social Determinants of Health (SDOH) Interventions SDOH Screenings   Food Insecurity: No Food Insecurity (04/02/2022)  Housing: Low Risk  (04/02/2022)  Transportation Needs: No Transportation Needs (04/02/2022)  Utilities: Not At Risk (04/02/2022)  Depression (PHQ2-9): Low Risk  (07/13/2018)  Tobacco Use: Medium Risk (04/08/2022)    Readmission Risk Interventions    04/04/2022    3:56 PM 02/07/2021    3:13 PM 07/31/2020   10:04 AM  Readmission Risk Prevention Plan  Transportation Screening Complete Complete Complete  PCP or Specialist Appt within 3-5 Days Complete Complete Complete  HRI or Home Care Consult Complete Complete Complete  Social Work Consult for [place] Planning/Counseling Complete Complete Not Complete  SW consult not completed comments   RNCM assigned to patient  Palliative Care Screening Not Applicable Not Applicable Not Applicable  Medication Review (RN Care Manager) Complete Complete Complete

## 2022-04-08 NOTE — Plan of Care (Signed)
  Problem: Education: Goal: Ability to describe self-care measures that may prevent or decrease complications (Diabetes Survival Skills Education) will improve Outcome: Progressing Goal: Individualized Educational Video(s) Outcome: Progressing   Problem: Coping: Goal: Ability to adjust to condition or change in health will improve Outcome: Progressing   Problem: Fluid Volume: Goal: Ability to maintain a balanced intake and output will improve Outcome: Progressing   Problem: Health Behavior/Discharge Planning: Goal: Ability to identify and utilize available resources and services will improve Outcome: Progressing Goal: Ability to manage health-related needs will improve Outcome: Progressing   Problem: Metabolic: Goal: Ability to maintain appropriate glucose levels will improve Outcome: Progressing   Problem: Nutritional: Goal: Maintenance of adequate nutrition will improve Outcome: Progressing Goal: Progress toward achieving an optimal weight will improve Outcome: Progressing   Problem: Tissue Perfusion: Goal: Adequacy of tissue perfusion will improve Outcome: Progressing   Problem: Education: Goal: Knowledge of General Education information will improve Description: Including pain rating scale, medication(s)/side effects and non-pharmacologic comfort measures Outcome: Progressing   Problem: Clinical Measurements: Goal: Ability to maintain clinical measurements within normal limits will improve Outcome: Progressing Goal: Will remain free from infection Outcome: Progressing Goal: Diagnostic test results will improve Outcome: Progressing Goal: Respiratory complications will improve Outcome: Progressing Goal: Cardiovascular complication will be avoided Outcome: Progressing   Problem: Activity: Goal: Risk for activity intolerance will decrease Outcome: Progressing   Problem: Nutrition: Goal: Adequate nutrition will be maintained Outcome: Progressing   Problem:  Coping: Goal: Level of anxiety will decrease Outcome: Progressing   Problem: Elimination: Goal: Will not experience complications related to bowel motility Outcome: Progressing Goal: Will not experience complications related to urinary retention Outcome: Progressing   Problem: Pain Managment: Goal: General experience of comfort will improve Outcome: Progressing   Problem: Safety: Goal: Ability to remain free from injury will improve Outcome: Progressing   Problem: Skin Integrity: Goal: Risk for impaired skin integrity will decrease Outcome: Progressing  Patient able to ambulate to [facility] no pain noted

## 2022-04-08 NOTE — Progress Notes (Addendum)
Initial Nutrition Assessment  DOCUMENTATION CODES:   Non-severe (moderate) malnutrition in context of chronic illness  INTERVENTION:   -MVI with minerals daily -Glucerna Shake po TID, each supplement provides 220 kcal and 10 grams of protein   NUTRITION DIAGNOSIS:   Moderate Malnutrition related to chronic illness (COPD, lung cancer) as evidenced by mild fat depletion, moderate fat depletion, mild muscle depletion, moderate muscle depletion.  GOAL:   Patient will meet greater than or equal to 90% of their needs  MONITOR:   PO intake, Supplement acceptance  REASON FOR ASSESSMENT:   Consult Assessment of nutrition requirement/status  ASSESSMENT:   Pt with past medical history of COPD and hypertension plus non-small cell lung cancer getting monthly radiation therapy and chronic respiratory failure on 4 L nasal cannula who presented with complaints of several days of progressively worsening shortness of breath and productive cough with brown sputum.  Pt admitted with sepsis secondary to pneumonia.   Reviewed I/O's: -900 ml x 24 hours and -9.5 L since admission  UOP: 1.6 L x 24 hours   Spoke with pt at bedside, who was pleasant and in good spirits today. He reports feeling much better since admission. Per pt, he has a great appetite and eating most of his meals served. Noted documented meal completions 75-100%; pt consumed all of his breakfast with the exception of the grits. Per pt, he had a decreased appetite and eating less than usual for about 1 wee PTA, due to generally feeling unwell. At baseline, pt consumes 2 meals per day (Breakfast: [place] [employer] Breakfast sandwich, Snacks: graham crackers, Dinner: TV dinner). Pt shares that he lives in a camper and has family members who live bedside him; they assist with meals and groceries. Pt expresses weakness, but denies any recent falls.   Pt recently underwent round of radiation for lung cancer. Per pt, treatments went well and he  has scheduled scan next week.  Reviewed wt hx; wt has been stable over the past 2 months. Per pt, his UBW is about 157# and estimates he has lost about 3-4 pounds over the past 1-2 weeks due to decreased oral intake.   Discussed importance of good meal and supplement intake to promote healing. Pt amenable to supplements, reporting his daughter occasionally buy him Ensure.   Medications reviewed.   Lab Results  Component Value Date   HGBA1C 7.5 (H) 04/02/2022   PTA DM medications are 5 mg linagliptin daily and 750 mg metformin BID.   Labs reviewed: CBGS: [id number] (inpatient orders for glycemic control are 0-15 units insulin aspart TID before meals and at bedtime and 15 units insulin glargine-yfgn daily).    NUTRITION - FOCUSED PHYSICAL EXAM:  Flowsheet Row Most Recent Value  Orbital Region Moderate depletion  Upper Arm Region Mild depletion  Thoracic and Lumbar Region Mild depletion  Buccal Region Mild depletion  Temple Region Moderate depletion  Clavicle Bone Region Mild depletion  Clavicle and Acromion Bone Region Moderate depletion  Scapular Bone Region Moderate depletion  Dorsal Hand Moderate depletion  Patellar Region Moderate depletion  Anterior Thigh Region Moderate depletion  Posterior Calf Region Moderate depletion  Edema (RD Assessment) None  Hair Reviewed  Eyes Reviewed  Mouth Reviewed  Skin Reviewed  Nails Reviewed       Diet Order:   Diet Order             Diet Carb Modified Fluid consistency: Thin; Room service appropriate? Yes  Diet effective now  EDUCATION NEEDS:   Education needs have been addressed  Skin:  Skin Assessment: Reviewed RN Assessment  Last BM:  04/06/22  Height:   Ht Readings from Last 1 Encounters:  04/01/22 5\' 7"  (1.702 m)    Weight:   Wt Readings from Last 1 Encounters:  04/01/22 72.1 kg    Ideal Body Weight:  67.3 kg  BMI:  Body mass index is 24.9 kg/m.  Estimated Nutritional Needs:   Kcal:   2150-2350  Protein:  105-120 grams  Fluid:  > 2 L    Loistine Chance, RD, LDN, [place] Registered Dietitian II Certified Diabetes Care and Education Specialist Please refer to [facility] for RD and/or RD on-call/weekend/after hours pager

## 2022-04-08 NOTE — Progress Notes (Signed)
[facility] Progress Note  Patient: Bradley [name]    JSH:[id number]  DOA: 04/01/2022    Date of Service: the patient was seen and examined on 04/08/2022  Brief hospital course: 72 year old male with past medical history of COPD and hypertension plus non-small cell lung cancer getting monthly radiation therapy and chronic respiratory failure on 4 L nasal cannula who presented to the emergency room on 1/29 with complaints of several days of progressively worsening shortness of breath and productive cough with brown sputum.  In the emergency room, workup revealed patient with sepsis secondary to pneumonia and patient started on IV fluids and antibiotics.  Initially patient requiring high flow nasal oxygen as high as 40 L.  Over the next few days, he has gradually improved and is currently down to 6 L nasal cannula (nonhigh flow).   Assessment and Plan: Severe sepsis secondary to pneumonia: Patient meets criteria with sepsis on admission given tachypnea, tachycardia, pneumonia source, respiratory failure.  Has responded well to antibiotics and oxygen able to be weaned down, currently on 6 L, regular O2.  Have added incentive spirometer  Acute on chronic respiratory failure with hypoxia: Continues to improve.  Baseline of 4 L nasal cannula.  History of non-small cell lung cancer on radiation therapy: Stable, not on any oral chemotherapy agents  Diabetes mellitus: Mildly elevated blood sugars.  A1c notes moderate control at 7.5.  Titrating up Lantus.  Moderate protein calorie malnutrition: Nutrition Status: Nutrition Problem: Moderate Malnutrition Etiology: chronic illness (COPD, lung cancer) Signs/Symptoms: mild fat depletion, moderate fat depletion, mild muscle depletion, moderate muscle depletion Interventions: Glucerna shake, MVI  Essential hypertension: Resume home metoprolol  Consultants: None  Procedures: None  Antimicrobials: IV Rocephin and Zithromax: 1/30 - 2/5  Code  Status: Full code   Subjective: Breathing continues to get easier  Objective: Mild tachycardia Vitals:   04/08/22 0731 04/08/22 1124  BP: 120/78 115/77  Pulse: (!) 104 (!) 110  Resp: 20 18  Temp:  98.3 F (36.8 C)  SpO2: 97% 91%    Intake/Output Summary (Last 24 hours) at 04/08/2022 1127 Last data filed at 04/08/2022 1116 Gross per 24 hour  Intake 1183 ml  Output 2400 ml  Net -1217 ml    Filed Weights   04/01/22 2206  Weight: 72.1 kg   Body mass index is 24.9 kg/m.  Exam:  General: Oriented x 3, alert HEENT: Normocephalic and atraumatic, mucous membranes are moist Cardiovascular: Borderline tachycardia Respiratory: Decreased breath sounds throughout with some mild end expiratory wheeze Abdomen: Soft, nontender, nondistended, positive bowel sounds Musculoskeletal: No clubbing or cyanosis or edema Skin: No skin breaks, tears or lesions Psychiatry: Appropriate, no evidence of psychoses Neurology: No focal deficits  Data Reviewed: White blood cell count of 12.0  Disposition:  Status is: Inpatient Remains inpatient appropriate because:  -Improvement in hypoxia    Anticipated discharge date: 2/6  Family Communication: Will call daughter DVT Prophylaxis: enoxaparin (LOVENOX) injection 40 mg Start: 04/02/22 2200 SCDs Start: 04/02/22 1117    Author: Annita [name] ,MD 04/08/2022 11:27 AM  To reach On-call, see care teams to locate the attending and reach out via [email or web address]. Between 7PM-7AM, please contact night-coverage If you still have difficulty reaching the attending provider, please page the [facility] (Director on Call) for [facility] on amion for assistance.

## 2022-04-08 NOTE — Progress Notes (Signed)
SaO2 on room air at rest = 85% SaO2 on room air while ambulating = 82% SaO2 on 6 liters of O2 while ambulating = 86%  Recovered to 92% on 6 liters of O2 at rest after ambulating.

## 2022-04-09 DIAGNOSIS — J189 Pneumonia, unspecified organism: Secondary | ICD-10-CM | POA: Diagnosis not present

## 2022-04-09 LAB — GLUCOSE, CAPILLARY
Glucose-Capillary: 161 mg/dL — ABNORMAL HIGH (ref 70–99)
Glucose-Capillary: 214 mg/dL — ABNORMAL HIGH (ref 70–99)
Glucose-Capillary: 166 mg/dL — ABNORMAL HIGH (ref 70–99)
Glucose-Capillary: 172 mg/dL — ABNORMAL HIGH (ref 70–99)

## 2022-04-09 LAB — CREATININE, SERUM
Creatinine, Ser: 1.04 mg/dL (ref 0.61–1.24)
GFR, Estimated: >60 mL/min (ref >60)

## 2022-04-09 NOTE — Progress Notes (Signed)
RE: Bradley [name] Date of Birth: 02/01/1951 Date: 04/09/22     To Whom It May Concern:   Please be advised that the above-named patient will require a short-term nursing home stay - anticipated 30 days or less for rehabilitation and strengthening.  The plan is for return home

## 2022-04-09 NOTE — TOC Progression Note (Signed)
Transition of Care [facility]) - Progression Note    Patient Details  Name: Bradley [name] MRN: [id number] Date of Birth: 02-11-1951  Transition of Care [facility]) CM/SW Contact  Beverly Sessions, RN Phone Number: 04/09/2022, 2:44 PM  Clinical Narrative:    With PT patient requiring 6L O2.  Therapy recommending SNF Patient in agreement for bedsearch PASRR pending.   Fl2 sent for MD to sign Bed search initiated    Expected Discharge Plan: Home/Self Care Barriers to Discharge: Continued Medical Work up  Expected Discharge Plan and Services       Living arrangements for the past 2 months: Single Family Home                                       Social Determinants of Health (SDOH) Interventions SDOH Screenings   Food Insecurity: No Food Insecurity (04/02/2022)  Housing: Low Risk  (04/02/2022)  Transportation Needs: No Transportation Needs (04/02/2022)  Utilities: Not At Risk (04/02/2022)  Depression (PHQ2-9): Low Risk  (07/13/2018)  Tobacco Use: Medium Risk (04/08/2022)    Readmission Risk Interventions    04/04/2022    3:56 PM 02/07/2021    3:13 PM 07/31/2020   10:04 AM  Readmission Risk Prevention Plan  Transportation Screening Complete Complete Complete  PCP or Specialist Appt within 3-5 Days Complete Complete Complete  HRI or Home Care Consult Complete Complete Complete  Social Work Consult for [place] Planning/Counseling Complete Complete Not Complete  SW consult not completed comments   RNCM assigned to patient  Palliative Care Screening Not Applicable Not Applicable Not Applicable  Medication Review (RN Care Manager) Complete Complete Complete

## 2022-04-09 NOTE — NC FL2 (Signed)
[place] LEVEL OF CARE FORM     IDENTIFICATION  Patient Name: Bradley [name] Birthdate: Jul 11, 1950 Sex: male Admission Date (Current Location): 04/01/2022  [facility] and [place] Number:  Engineering geologist and Address:         Provider Number: [id number]  Attending Physician Name and Address:  Lorella Nimrod, MD  Relative Name and Phone Number:       Current Level of Care: Hospital Recommended Level of Care: [place] Prior Approval Number:    Date Approved/Denied:   PASRR Number: Pending  Discharge Plan: SNF    Current Diagnoses: Patient Active Problem List   Diagnosis Date Noted   Lactic acidosis 04/02/2022   Type II diabetes mellitus with renal manifestations ([place]) 02/17/2022   Chronic kidney disease, stage 3a ([place]) 02/17/2022   CAD (coronary artery disease) 02/17/2022   Non-small cell lung cancer (NSCLC) ([place]) 02/17/2022   Nausea vomiting and diarrhea 02/17/2022   Alcohol abuse 02/17/2022   Malnutrition of moderate degree 07/11/2021   Sepsis due to pneumonia ([place]) 07/10/2021   Type 2 diabetes mellitus without complications ([place]) 54/65/6812   Dyslipidemia 07/10/2021   Essential hypertension 07/10/2021   Multifocal pneumonia 03/02/2021   Sepsis ([place]) 02/05/2021   History of tobacco use disorder 02/05/2021   Alcohol use disorder, moderate, dependence ([place]) 02/05/2021   Acute on chronic respiratory failure ([place]) 07/30/2020   Acute on chronic respiratory failure with hypoxia ([place]) 07/29/2020   CKD (chronic kidney disease) stage 3, GFR 30-59 ml/min (HCC) 07/29/2020   Hypertension associated with diabetes ([place]) 07/29/2020   Hyperlipidemia associated with type 2 diabetes mellitus ([place]) 07/29/2020   COVID-19 virus infection 07/29/2020   Callus 07/13/2020   Pain due to onychomycosis of toenails of both feet 01/06/2020   Type 2 diabetes mellitus with vascular disease ([place]) 01/06/2020   Chronic renal failure, stage 2 (mild) 12/01/2019    Lightheadedness 11/16/2019   Acute kidney injury ([place]) 04/20/2019   PAD (peripheral artery disease) ([place]) 04/20/2019   Tachycardia 04/20/2019   B12 deficiency 12/07/2017   OSA (obstructive sleep apnea) 11/28/2017   Lower extremity pain, left 03/06/2017   Numbness and tingling of left leg 03/06/2017   Coronary artery disease involving native coronary artery of native heart without angina pectoris 02/18/2017   Diastasis recti 02/18/2017   CVD (cardiovascular disease) 02/18/2017   Need for vaccination 75/17/0017   Umbilical hernia without obstruction and without gangrene 02/18/2017   Hemoglobinuria 08/21/2016   Anemia 08/07/2016   Pneumonia 07/15/2016   Lung mass 06/28/2016   History of cigarette smoking 06/19/2016   Screening for malignant neoplasm of respiratory organ 06/19/2016   Acute respiratory failure with hypoxia ([place]) 12/27/2015   Community acquired pneumonia 12/27/2015   Acute respiratory distress 09/19/2015   Adjustment disorder with mixed anxiety and depressed mood 04/14/2015   Insomnia 04/14/2015   Grief 04/14/2015   Hyponatremia 04/12/2015   CAP (community acquired pneumonia) 10/08/2014   Hypogammaglobulinemia ([place]) 08/17/2014   Leukocytosis 08/03/2014   Recurrent infections 08/03/2014   Tinnitus of right ear 03/03/2014   Left carotid artery stenosis 07/21/2013   HLD (hyperlipidemia) 10/05/2010   Onychomycosis 10/05/2010    Orientation RESPIRATION BLADDER Height & Weight     Self, Time, Situation, Place  O2 (6L [place]) Incontinent Weight: 72.1 kg Height:  5\' 7"  (170.2 cm)  BEHAVIORAL SYMPTOMS/MOOD NEUROLOGICAL BOWEL NUTRITION STATUS      Continent Diet (Carb modified)  AMBULATORY STATUS COMMUNICATION OF NEEDS Skin   Extensive Assist Verbally  Normal                       Personal Care Assistance Level of Assistance              Functional Limitations Info             SPECIAL CARE FACTORS FREQUENCY  PT (By licensed PT), OT (By licensed OT)                     Contractures Contractures Info: Not present    Additional Factors Info  Code Status, Allergies Code Status Info: Full Allergies Info: Ace inhibitors           Current Medications (04/09/2022):  This is the current hospital active medication list Current Facility-Administered Medications  Medication Dose Route Frequency Provider Last Rate Last Admin   acetaminophen (TYLENOL) tablet 650 mg  650 mg Oral Q6H PRN Annita Brod, MD   650 mg at 04/06/22 2109   Or   acetaminophen (TYLENOL) suppository 650 mg  650 mg Rectal Q6H PRN Annita Brod, MD       albuterol (PROVENTIL) (2.5 MG/3ML) 0.083% nebulizer solution 3 mL  3 mL Inhalation Q6H PRN Annita Brod, MD       aspirin EC tablet 81 mg  81 mg Oral QPM Annita Brod, MD   81 mg at 04/08/22 1811   brimonidine (ALPHAGAN) 0.2 % ophthalmic solution 1 drop  1 drop Both Eyes BID Annita Brod, MD   1 drop at 04/09/22 1012   enoxaparin (LOVENOX) injection 40 mg  40 mg Subcutaneous Q24H Annita Brod, MD   40 mg at 04/08/22 2158   famotidine (PEPCID) tablet 20 mg  20 mg Oral Daily Annita Brod, MD   20 mg at 04/09/22 1011   feeding supplement (GLUCERNA SHAKE) (GLUCERNA SHAKE) liquid 237 mL  237 mL Oral TID BM Annita Brod, MD   237 mL at 04/08/22 2000   fluticasone (FLONASE) 50 MCG/ACT nasal spray 1 spray  1 spray Each Nare Daily Annita Brod, MD   1 spray at 04/08/22 1110   guaiFENesin (MUCINEX) 12 hr tablet 600 mg  600 mg Oral BID Gevena Barre K, MD   600 mg at 04/09/22 1011   insulin aspart (novoLOG) injection 0-15 Units  0-15 Units Subcutaneous TID AC & HS Annita Brod, MD   5 Units at 04/09/22 1204   insulin glargine-yfgn (SEMGLEE) injection 15 Units  15 Units Subcutaneous Daily Annita Brod, MD   15 Units at 04/09/22 1010   ipratropium-albuterol (DUONEB) 0.5-2.5 (3) MG/3ML nebulizer solution 3 mL  3 mL Nebulization Q6H Annita Brod, MD   3 mL at 04/09/22  1344   metoprolol tartrate (LOPRESSOR) tablet 50 mg  50 mg Oral BID Annita Brod, MD   50 mg at 04/09/22 1011   mometasone-formoterol (DULERA) 200-5 MCG/ACT inhaler 2 puff  2 puff Inhalation BID Annita Brod, MD   2 puff at 04/09/22 0808   montelukast (SINGULAIR) tablet 10 mg  10 mg Oral QHS Annita Brod, MD   10 mg at 04/08/22 2158   multivitamin with minerals tablet 1 tablet  1 tablet Oral Daily Annita Brod, MD   1 tablet at 04/09/22 1011   rosuvastatin (CRESTOR) tablet 40 mg  40 mg Oral Angelena Form, Trenton Gammon, MD   40 mg at 04/09/22 0810   sodium chloride (OCEAN)  0.65 % nasal spray 1 spray  1 spray Each Nare PRN Annita Brod, MD   1 spray at 04/03/22 0253   sodium chloride flush (NS) 0.9 % injection 3 mL  3 mL Intravenous Q12H Annita Brod, MD   3 mL at 04/09/22 1012   Facility-Administered Medications Ordered in Other Encounters  Medication Dose Route Frequency Provider Last Rate Last Admin   sodium chloride flush (NS) 0.9 % injection 3 mL  3 mL Intravenous Q12H Teodoro Spray, MD         Discharge Medications: Please see discharge summary for a list of discharge medications.  Relevant Imaging Results:  Relevant Lab Results:   Additional Information ss [id number]  Beverly Sessions, RN

## 2022-04-09 NOTE — Progress Notes (Signed)
[facility] Progress Note  Patient: Bradley [name]    TKP:[id number]  DOA: 04/01/2022    Date of Service: the patient was seen and examined on 04/09/2022  Brief hospital course: 72 year old male with past medical history of COPD and hypertension plus non-small cell lung cancer getting monthly radiation therapy and chronic respiratory failure on 4 L nasal cannula who presented to the emergency room on 1/29 with complaints of several days of progressively worsening shortness of breath and productive cough with brown sputum.  In the emergency room, workup revealed patient with sepsis secondary to pneumonia and patient started on IV fluids and antibiotics.  Initially patient requiring high flow nasal oxygen as high as 40 L.  Over the next few days, he has gradually improved and is currently down to 6 L nasal cannula (nonhigh flow).   2/6: Vital stable.  Weaned back to 4 L of oxygen which is his baseline.  Completed a course of antibiotic for pneumonia.  Patient becoming hypoxic with minor exertion. PT evaluation was obtained and they are recommending SNF.  Assessment and Plan: Severe sepsis secondary to pneumonia: Patient meets criteria with sepsis on admission given tachypnea, tachycardia, pneumonia source, respiratory failure.  Has responded well to antibiotics and oxygen able to be weaned down, currently on 6 L, regular O2.  Have added incentive spirometer  Acute on chronic respiratory failure with hypoxia: Continues to improve.  Baseline of 4 L nasal cannula.  History of non-small cell lung cancer on radiation therapy: Stable, not on any oral chemotherapy agents  Diabetes mellitus: Mildly elevated blood sugars.  A1c notes moderate control at 7.5.  Titrating up Lantus.  Moderate protein calorie malnutrition: Nutrition Status: Nutrition Problem: Moderate Malnutrition Etiology: chronic illness (COPD, lung cancer) Signs/Symptoms: mild fat depletion, moderate fat depletion, mild muscle  depletion, moderate muscle depletion Interventions: Glucerna shake, MVI  Essential hypertension: Resume home metoprolol  Consultants: None  Procedures: None  Antimicrobials: IV Rocephin and Zithromax: 1/30 - 2/5  Code Status: Full code   Subjective: Patient was seen and examined today.  He was feeling close to baseline by lying down in bed.  Later becoming hypoxic with minor exertion.  Per patient he lives alone and a grandson lives next door.  Objective: Mild tachycardia Vitals:   04/09/22 1146 04/09/22 1200  BP:  98/67  Pulse:  85  Resp:  20  Temp:  98.1 F (36.7 C)  SpO2: 90% 97%    Intake/Output Summary (Last 24 hours) at 04/09/2022 1332 Last data filed at 04/09/2022 1308 Gross per 24 hour  Intake 1680 ml  Output 2750 ml  Net -1070 ml   Filed Weights   04/01/22 2206  Weight: 72.1 kg   Body mass index is 24.9 kg/m.  Exam:  General.  Frail gentleman, in no acute distress. Pulmonary.  Lungs clear bilaterally, normal respiratory effort. CV.  Regular rate and rhythm, no JVD, rub or murmur. Abdomen.  Soft, nontender, nondistended, BS positive. CNS.  Alert and oriented .  No focal neurologic deficit. Extremities.  No edema, no cyanosis, pulses intact and symmetrical. Psychiatry.  Judgment and insight appears normal.   Data Reviewed: Prior data reviewed  Disposition:  Status is: Inpatient Remains inpatient appropriate because:  -Improvement in hypoxia    Anticipated discharge date: 2/6  Family Communication: Discussed with daughter on phone  DVT Prophylaxis: enoxaparin (LOVENOX) injection 40 mg Start: 04/02/22 2200 SCDs Start: 04/02/22 1117  This record has been created using Systems analyst. Errors have  been sought and corrected,but may not always be located. Such creation errors do not reflect on the standard of care.   Author: Lorella Nimrod ,MD 04/09/2022 1:32 PM  To reach On-call, see care teams to locate the attending and reach  out via [email or web address]. Between 7PM-7AM, please contact night-coverage If you still have difficulty reaching the attending provider, please page the [facility] (Director on Call) for [facility] on amion for assistance.

## 2022-04-09 NOTE — Evaluation (Signed)
Physical Therapy Evaluation Patient Details Name: Bradley [name] MRN: [id number] DOB: [birth year] Today's Date: 04/09/2022  History of Present Illness  presented to ER secondary to progressive cough, SOB, nausea/vomiting; admitted for mangement of acute/chronic respiratory failure with hypoxia, severe sepsis related to PNA  Clinical Impression  Patient resting in bed upon arrival to room; alert and oriented, follows commands and agreeable to participation with session as tolerated.  Endorses persistent SOB at rest, worsened with speaking/conversation; resting sats 84% on 4L.  Increased to 6L for recovery 98-90% at rest. Bilat UE/LE strength and ROM Grossly symmetrical and WFL; no focal weakness appreciated.  No pain reported.  Able to complete bed mobility with mod indep; sit/stand, basic transfers without assist device, cga.  Does require UE support for external stabilization and overall energy conservation/safety.  Persistent desat to 84-85% with simple transfer on 6L, significant SOB noted; additional gait/mobility deferred as result. Reviewed role of pursed lip breathing, chest expansion therex for pulmonary hygiene; importance of OOB/upright positioning; LE seated therex for use outside of therapy.  Patient voiced understanding.  Will reinforce throughout stay as appropriate.  May benefit from use of incentive spirometer; RN/MD informed/aware Would benefit from skilled PT to address above deficits and promote optimal return to PLOF.; recommend transition to STR upon discharge from acute hospitalization. Do anticipate possible upgrade to HHPT as respiratory status stabilizes; will continue to monitor and update as appropriate.  SaO2 on 4L at rest = 84% SaO2 on 6L at rest = 89-90% SaO2 on 6L liters of O2 while ambulating = 85%         Recommendations for follow up therapy are one component of a multi-disciplinary discharge planning process, led by the attending physician.  Recommendations may be  updated based on patient status, additional functional criteria and insurance authorization.  Follow Up Recommendations Skilled nursing-short term rehab (<3 hours/day) (may improve to HHPT as respiratory status stabilizes; will monitor/update as medically appropriate) Can patient physically be transported by private vehicle: Yes    Assistance Recommended at Discharge Frequent or constant Supervision/Assistance  Patient can return home with the following  A little help with walking and/or transfers;A little help with bathing/dressing/bathroom    Equipment Recommendations Rolling walker (2 wheels)  Recommendations for Other Services       Functional Status Assessment Patient has had a recent decline in their functional status and demonstrates the ability to make significant improvements in function in a reasonable and predictable amount of time.     Precautions / Restrictions Precautions Precautions: Fall Restrictions Weight Bearing Restrictions: No      Mobility  Bed Mobility Overal bed mobility: Modified Independent                  Transfers Overall transfer level: Needs assistance   Transfers: Sit to/from Stand, Bed to chair/wheelchair/BSC Sit to Stand: Min guard Stand pivot transfers: Min guard              Ambulation/Gait               General Gait Details: deferred due to SOB, desat with bed/chair transfer  Stairs            Wheelchair Mobility    Modified Rankin (Stroke Patients Only)       Balance Overall balance assessment: Needs assistance Sitting-balance support: No upper extremity supported, Feet supported Sitting balance-Leahy Scale: Good     Standing balance support: No upper extremity supported Standing balance-Leahy Scale: Fair  Pertinent Vitals/Pain Pain Assessment Pain Assessment: No/denies pain    Home Living Family/patient expects to be discharged to:: Private  residence Living Arrangements: Alone (grandson lives beside him) Available Help at Discharge: Family Type of Home:  (camper) Home Access: Stairs to enter Entrance Stairs-Rails: Left Entrance Stairs-Number of Steps: 2-3   Home Layout: One level Home Equipment: Wheelchair - manual;Cane - single point (pulse ox)      Prior Function Prior Level of Function : Needs assist;Driving             Mobility Comments: reports he rarely walks more than 50 ft at a time, son does his laundry and heavier errands; enjoys driving; denies fall history; home O2 at 3L (up to 4L with gait at times)       Hand Dominance   Dominant Hand: Right    Extremity/Trunk Assessment   Upper Extremity Assessment Upper Extremity Assessment: Overall WFL for tasks assessed    Lower Extremity Assessment Lower Extremity Assessment: Overall WFL for tasks assessed       Communication   Communication: No difficulties  Cognition Arousal/Alertness: Awake/alert Behavior During Therapy: WFL for tasks assessed/performed Overall Cognitive Status: Within Functional Limits for tasks assessed                                          General Comments      Exercises Other Exercises Other Exercises: Reviewed role of pursed lip breathing, chest expansion therex for pulmonary hygiene; importance of OOB/upright positioning; LE seated therex for use outside of therapy.  Patient voiced understanding.  Will reinforce throughout stay as appropriate Other Exercises: May benefit from use of incentive spirometer; RN/MD informed/aware   Assessment/Plan    PT Assessment Patient needs continued PT services  PT Problem List Decreased balance;Decreased mobility;Decreased activity tolerance;Decreased coordination;Cardiopulmonary status limiting activity       PT Treatment Interventions DME instruction;Gait training;Stair training;Therapeutic activities;Functional mobility training;Therapeutic exercise;Balance  training;Patient/family education    PT Goals (Current goals can be found in the Care Plan section)  Acute Rehab PT Goals Patient Stated Goal: to return home PT Goal Formulation: With patient Time For Goal Achievement: 04/23/22 Potential to Achieve Goals: Fair    Frequency Min 2X/week     Co-evaluation               AM-PAC PT "6 Clicks" Mobility  Outcome Measure Help needed turning from your back to your side while in a flat bed without using bedrails?: None Help needed moving from lying on your back to sitting on the side of a flat bed without using bedrails?: None Help needed moving to and from a bed to a chair (including a wheelchair)?: A Little Help needed standing up from a chair using your arms (e.g., wheelchair or bedside chair)?: A Little Help needed to walk in hospital room?: A Lot Help needed climbing 3-5 steps with a railing? : A Lot 6 Click Score: 18    End of Session Equipment Utilized During Treatment: Gait belt Activity Tolerance: Patient tolerated treatment well Patient left: in chair;with chair alarm set;with call bell/phone within reach Nurse Communication: Mobility status PT Visit Diagnosis: Muscle weakness (generalized) (M62.81);Difficulty in walking, not elsewhere classified (R26.2)    Time: 2947-6546 PT Time Calculation (min) (ACUTE ONLY): 15 min   Charges:   PT Evaluation $PT Eval Moderate Complexity: 1 Mod  Namish Krise H. Owens Shark, PT, DPT, NCS 04/09/22, 11:57 AM [phone]

## 2022-04-09 NOTE — Plan of Care (Signed)
  Problem: Education: Goal: Ability to describe self-care measures that may prevent or decrease complications (Diabetes Survival Skills Education) will improve Outcome: Progressing Goal: Individualized Educational Video(s) Outcome: Progressing   Problem: Coping: Goal: Ability to adjust to condition or change in health will improve Outcome: Progressing   Problem: Fluid Volume: Goal: Ability to maintain a balanced intake and output will improve Outcome: Progressing   Problem: Health Behavior/Discharge Planning: Goal: Ability to identify and utilize available resources and services will improve Outcome: Progressing Goal: Ability to manage health-related needs will improve Outcome: Progressing   Problem: Nutritional: Goal: Maintenance of adequate nutrition will improve Outcome: Progressing Goal: Progress toward achieving an optimal weight will improve Outcome: Progressing   Problem: Tissue Perfusion: Goal: Adequacy of tissue perfusion will improve Outcome: Progressing   Problem: Education: Goal: Knowledge of General Education information will improve Description: Including pain rating scale, medication(s)/side effects and non-pharmacologic comfort measures Outcome: Progressing

## 2022-04-09 NOTE — TOC Progression Note (Signed)
Transition of Care [facility]) - Progression Note    Patient Details  Name: Bradley [name] MRN: [id number] Date of Birth: February 06, 1951  Transition of Care [facility]) CM/SW Contact  Beverly Sessions, RN Phone Number: 04/09/2022, 3:21 PM  Clinical Narrative:      Clinical uploaded to [facility] for level 2 PASRR Expected Discharge Plan: Home/Self Care Barriers to Discharge: Continued Medical Work up  Expected Discharge Plan and Services       Living arrangements for the past 2 months: Single Family Home                                       Social Determinants of Health (SDOH) Interventions SDOH Screenings   Food Insecurity: No Food Insecurity (04/02/2022)  Housing: [place]  (04/02/2022)  Transportation Needs: No Transportation Needs (04/02/2022)  Utilities: Not At Risk (04/02/2022)  Depression (PHQ2-9): Low Risk  (07/13/2018)  Tobacco Use: Medium Risk (04/08/2022)    Readmission Risk Interventions    04/04/2022    3:56 PM 02/07/2021    3:13 PM 07/31/2020   10:04 AM  Readmission Risk Prevention Plan  Transportation Screening Complete Complete Complete  PCP or Specialist Appt within 3-5 Days Complete Complete Complete  HRI or Home Care Consult Complete Complete Complete  Social Work Consult for [place] Planning/Counseling Complete Complete Not Complete  SW consult not completed comments   RNCM assigned to patient  Palliative Care Screening Not Applicable Not Applicable Not Applicable  Medication Review (RN Care Manager) Complete Complete Complete

## 2022-04-10 DIAGNOSIS — J189 Pneumonia, unspecified organism: Secondary | ICD-10-CM | POA: Diagnosis not present

## 2022-04-10 LAB — GLUCOSE, CAPILLARY
Glucose-Capillary: 155 mg/dL — ABNORMAL HIGH (ref 70–99)
Glucose-Capillary: 216 mg/dL — ABNORMAL HIGH (ref 70–99)
Glucose-Capillary: 202 mg/dL — ABNORMAL HIGH (ref 70–99)
Glucose-Capillary: 181 mg/dL — ABNORMAL HIGH (ref 70–99)

## 2022-04-10 NOTE — TOC Progression Note (Signed)
Transition of Care [facility]) - Progression Note    Patient Details  Name: Bradley [name] MRN: [id number] Date of Birth: [birth year]  Transition of [facility]) CM/SW Contact  Beverly Sessions, RN Phone Number: 04/10/2022, 5:52 PM  Clinical Narrative:    Most facilities are not able to accommodate over 5L o2 Follow up with patient and he would like to accepted bed at [facility]. Accepted in [place]. And notified Ebony Hail at [place].  She states they will have to order a 10 L regulator, but that it should be in within 24 hours.   Patient request that I update daughter [name].  Spoke with her by phone and updated her on discharge plan.   Auth started in [place] portal for SNF   Expected Discharge Plan: Home/Self Care Barriers to Discharge: Continued Medical Work up  Expected Discharge Plan and Services       Living arrangements for the past 2 months: Single Family Home                                       Social Determinants of Health (SDOH) Interventions SDOH Screenings   Food Insecurity: No Food Insecurity (04/02/2022)  Housing: [place]  (04/02/2022)  Transportation Needs: No Transportation Needs (04/02/2022)  Utilities: Not At Risk (04/02/2022)  Depression (PHQ2-9): Low Risk  (07/13/2018)  Tobacco Use: Medium Risk (04/08/2022)    Readmission Risk Interventions    04/04/2022    3:56 PM 02/07/2021    3:13 PM 07/31/2020   10:04 AM  Readmission Risk Prevention Plan  Transportation Screening Complete Complete Complete  PCP or Specialist Appt within 3-5 Days Complete Complete Complete  HRI or Home Care Consult Complete Complete Complete  Social Work Consult for [place] Planning/Counseling Complete Complete Not Complete  SW consult not completed comments   RNCM assigned to patient  Palliative Care Screening Not Applicable Not Applicable Not Applicable  Medication Review (RN Care Manager) Complete Complete Complete

## 2022-04-10 NOTE — Progress Notes (Signed)
[facility] Progress Note  Patient: Bradley [name]    GUR:[id number]  DOA: 04/01/2022    Date of Service: the patient was seen and examined on 04/10/2022  Brief hospital course: 72 year old male with past medical history of COPD and hypertension plus non-small cell lung cancer getting monthly radiation therapy and chronic respiratory failure on 4 L nasal cannula who presented to the emergency room on 1/29 with complaints of several days of progressively worsening shortness of breath and productive cough with brown sputum.  In the emergency room, workup revealed patient with sepsis secondary to pneumonia and patient started on IV fluids and antibiotics.  Initially patient requiring high flow nasal oxygen as high as 40 L.  Over the next few days, he has gradually improved and is currently down to 6 L nasal cannula (nonhigh flow).   2/6: Vital stable.  Weaned back to 4 L of oxygen which is his baseline.  Completed a course of antibiotic for pneumonia.  Patient becoming hypoxic with minor exertion. PT evaluation was obtained and they are recommending SNF.  2/7: Hemodynamically stable.  Saturating well on baseline oxygen of 4 L at rest but do need 5 to 6 L of oxygen with ambulation.  TOC is trying to find a place which can take up to 6 L of oxygen. Might be his new baseline  Assessment and Plan: Severe sepsis secondary to pneumonia: Patient meets criteria with sepsis on admission given tachypnea, tachycardia, pneumonia source, respiratory failure.  Has responded well to antibiotics and oxygen able to be weaned down, currently on 6 L, regular O2.  Have added incentive spirometer  Acute on chronic respiratory failure with hypoxia: Continues to improve.  Baseline of 4 L nasal cannula.  History of non-small cell lung cancer on radiation therapy: Stable, not on any oral chemotherapy agents  Diabetes mellitus: Mildly elevated blood sugars.  A1c notes moderate control at 7.5.  Titrating up  Lantus.  Moderate protein calorie malnutrition: Nutrition Status: Nutrition Problem: Moderate Malnutrition Etiology: chronic illness (COPD, lung cancer) Signs/Symptoms: mild fat depletion, moderate fat depletion, mild muscle depletion, moderate muscle depletion Interventions: Glucerna shake, MVI  Essential hypertension: Resume home metoprolol  Consultants: None  Procedures: None  Antimicrobials: IV Rocephin and Zithromax: 1/30 - 2/5  Code Status: Full code   Subjective: Patient was sitting in chair comfortably when seen today.  Becoming little more short of breath and requiring more than baseline of oxygen with ambulation.  Objective: Mild tachycardia Vitals:   04/10/22 1315 04/10/22 1500  BP:  111/67  Pulse:  78  Resp:  20  Temp:  98.2 F (36.8 C)  SpO2: 95% 95%    Intake/Output Summary (Last 24 hours) at 04/10/2022 1550 Last data filed at 04/10/2022 1218 Gross per 24 hour  Intake 480 ml  Output 1550 ml  Net -1070 ml    Filed Weights   04/01/22 2206  Weight: 72.1 kg   Body mass index is 24.9 kg/m.  Exam:  General.  Frail elderly man, in no acute distress. Pulmonary.  Lungs clear bilaterally, normal respiratory effort. CV.  Regular rate and rhythm, no JVD, rub or murmur. Abdomen.  Soft, nontender, nondistended, BS positive. CNS.  Alert and oriented .  No focal neurologic deficit. Extremities.  No edema, no cyanosis, pulses intact and symmetrical. Psychiatry.  Judgment and insight appears normal.   Data Reviewed: Prior data reviewed  Disposition:  Status is: Inpatient Remains inpatient appropriate because:  -Improvement in hypoxia    Anticipated discharge  date: Awaiting SNF placement  Family Communication: Discussed with daughter on phone  DVT Prophylaxis: enoxaparin (LOVENOX) injection 40 mg Start: 04/02/22 2200 SCDs Start: 04/02/22 1117  This record has been created using Systems analyst. Errors have been sought and  corrected,but may not always be located. Such creation errors do not reflect on the standard of care.   Author: Lorella Nimrod ,MD 04/10/2022 3:50 PM  To reach On-call, see care teams to locate the attending and reach out via [email or web address]. Between 7PM-7AM, please contact night-coverage If you still have difficulty reaching the attending provider, please page the [facility] (Director on Call) for [facility] on amion for assistance.

## 2022-04-10 NOTE — TOC Progression Note (Addendum)
Transition of Care [facility]) - Progression Note    Patient Details  Name: SADIEL MOTA MRN: [id number] Date of Birth: [birth year]  Transition of Care [facility]) CM/SW Contact  Beverly Sessions, RN Phone Number: 04/10/2022, 12:20 PM  Clinical Narrative:    Rosalie Gums obtained - [phone] [id number]  Presented bed offer of [place] [facility] to patient. [facility] and [facility] still pending in hub   Patient would like a response from tem before making the decision on [place].  Message sent to both facilities for review    Expected Discharge Plan: Home/Self Care Barriers to Discharge: Continued Medical Work up  Expected Discharge Plan and Services       Living arrangements for the past 2 months: Single Family Home                                       Social Determinants of Health (SDOH) Interventions SDOH Screenings   Food Insecurity: No Food Insecurity (04/02/2022)  Housing: Low Risk  (04/02/2022)  Transportation Needs: No Transportation Needs (04/02/2022)  Utilities: Not At Risk (04/02/2022)  Depression (PHQ2-9): Low Risk  (07/13/2018)  Tobacco Use: Medium Risk (04/08/2022)    Readmission Risk Interventions    04/04/2022    3:56 PM 02/07/2021    3:13 PM 07/31/2020   10:04 AM  Readmission Risk Prevention Plan  Transportation Screening Complete Complete Complete  PCP or Specialist Appt within 3-5 Days Complete Complete Complete  HRI or Home Care Consult Complete Complete Complete  Social Work Consult for [place] Planning/Counseling Complete Complete Not Complete  SW consult not completed comments   RNCM assigned to patient  Palliative Care Screening Not Applicable Not Applicable Not Applicable  Medication Review (RN Care Manager) Complete Complete Complete

## 2022-04-11 DIAGNOSIS — J189 Pneumonia, unspecified organism: Secondary | ICD-10-CM | POA: Diagnosis not present

## 2022-04-11 LAB — MAGNESIUM: Magnesium: 2.2 mg/dL (ref 1.7–2.4)

## 2022-04-11 LAB — BASIC METABOLIC PANEL
Anion gap: 6 (ref 5–15)
BUN: 24 mg/dL — ABNORMAL HIGH (ref 8–23)
CO2: 29 mmol/L (ref 22–32)
Calcium: 8.7 mg/dL — ABNORMAL LOW (ref 8.9–10.3)
Chloride: 101 mmol/L (ref 98–111)
Creatinine, Ser: 0.84 mg/dL (ref 0.61–1.24)
GFR, Estimated: >60 mL/min (ref >60)
Glucose, Bld: 157 mg/dL — ABNORMAL HIGH (ref 70–99)
Potassium: 4.2 mmol/L (ref 3.5–5.1)
Sodium: 136 mmol/L (ref 135–145)

## 2022-04-11 LAB — CBC
HCT: 45 % (ref 39.0–52.0)
Hemoglobin: 14.2 g/dL (ref 13.0–17.0)
MCH: 28.2 pg (ref 26.0–34.0)
MCHC: 31.6 g/dL (ref 30.0–36.0)
MCV: 89.5 fL (ref 80.0–100.0)
Platelets: 300 10*3/uL (ref 150–400)
RBC: 5.03 MIL/uL (ref 4.22–5.81)
RDW: 15.5 % (ref 11.5–15.5)
WBC: 10.1 10*3/uL (ref 4.0–10.5)
nRBC: 0 % (ref 0.0–0.2)

## 2022-04-11 LAB — GLUCOSE, CAPILLARY
Glucose-Capillary: 155 mg/dL — ABNORMAL HIGH (ref 70–99)
Glucose-Capillary: 252 mg/dL — ABNORMAL HIGH (ref 70–99)
Glucose-Capillary: 113 mg/dL — ABNORMAL HIGH (ref 70–99)
Glucose-Capillary: 215 mg/dL — ABNORMAL HIGH (ref 70–99)
Glucose-Capillary: 221 mg/dL — ABNORMAL HIGH (ref 70–99)

## 2022-04-11 LAB — PHOSPHORUS: Phosphorus: 3.4 mg/dL (ref 2.5–4.6)

## 2022-04-11 NOTE — Progress Notes (Signed)
PROGRESS NOTE    Bradley [name]  BHA:[id number] DOB: December 07, 1950 DOA: 04/01/2022 PCP: Valera Castle, MD   Brief Narrative:  This 72 year old male with past medical history of COPD and hypertension plus non-small cell lung cancer getting monthly radiation therapy and chronic respiratory failure on 4 L nasal cannula who presented to the emergency room on 1/29 with complaints of several days of progressively worsening shortness of breath and productive cough with brown sputum.  In the emergency room, workup revealed patient with sepsis secondary to pneumonia and patient started on IV fluids and antibiotics. Initially patient requiring high flow nasal oxygen as high as 40 L.  Over the next few days, he has gradually improved and is currently down to 6 L nasal cannula (nonhigh flow).  Medically cleared , awaiting SNF placement and insurance authorization.   Assessment & Plan:   Principal Problem:   Pneumonia Active Problems:   Acute on chronic respiratory failure with hypoxia (HCC)   Sepsis due to pneumonia [facility])   Essential hypertension   Non-small cell lung cancer (NSCLC) (HCC)   Lactic acidosis   Severe sepsis secondary to pneumonia:  Patient meets criteria with sepsis on admission given tachypnea, tachycardia, pneumonia source, respiratory failure.  Has responded well to antibiotics and oxygen able to be weaned down, currently on 6 L, regular O2.  Have added incentive spirometer   Acute on chronic respiratory failure with hypoxia: Continues to improve.  Baseline of 4 L nasal cannula.   History of non-small cell lung cancer on radiation therapy: Stable, not on any oral chemotherapy agents   Diabetes mellitus: Mildly elevated blood sugars.  A1c notes moderate control at 7.5.  Titrating up Lantus.   Moderate protein calorie malnutrition: Nutrition Status: Nutrition Problem: Moderate Malnutrition Etiology: chronic illness (COPD, lung cancer) Signs/Symptoms: mild fat depletion,  moderate fat depletion, mild muscle depletion, moderate muscle depletion Interventions: Glucerna shake, MVI   Essential hypertension: Resume home metoprolol   DVT prophylaxis:  Lovenox Code Status: Full code Family Communication: No family at bed side. Disposition Plan:   Status is: Inpatient Remains inpatient appropriate because: Medically cleared awaiting SNF placement. Peer 2 peer review completed, Now needing updated PT notes.   Consultants:  None  Procedures: None  Antimicrobials:  Anti-infectives (From admission, onward)    Start     Dose/Rate Route Frequency Ordered Stop   04/04/22 2200  azithromycin (ZITHROMAX) tablet 250 mg        250 mg Oral Daily 04/03/22 1411 04/05/22 2129   04/02/22 2200  azithromycin (ZITHROMAX) 250 mg in dextrose 5 % 125 mL IVPB        250 mg 127.5 mL/hr over 60 Minutes Intravenous Every 24 hours 04/02/22 0931 04/03/22 2229   04/02/22 1130  azithromycin (ZITHROMAX) 500 mg in sodium chloride 0.9 % 250 mL IVPB  Status:  Discontinued        500 mg 250 mL/hr over 60 Minutes Intravenous Every 24 hours 04/02/22 1116 04/02/22 1117   04/02/22 1130  cefTRIAXone (ROCEPHIN) 2 g in sodium chloride 0.9 % 100 mL IVPB  Status:  Discontinued        2 g 200 mL/hr over 30 Minutes Intravenous Every 24 hours 04/02/22 1116 04/02/22 1117   04/02/22 1000  cefTRIAXone (ROCEPHIN) 2 g in sodium chloride 0.9 % 100 mL IVPB        2 g 200 mL/hr over 30 Minutes Intravenous Every 24 hours 04/02/22 0931 04/08/22 1953   04/01/22 2300  ceFEPIme (MAXIPIME)  2 g in sodium chloride 0.9 % 100 mL IVPB        2 g 200 mL/hr over 30 Minutes Intravenous  Once 04/01/22 2249 04/02/22 0030   04/01/22 2300  azithromycin (ZITHROMAX) 500 mg in sodium chloride 0.9 % 250 mL IVPB        500 mg 250 mL/hr over 60 Minutes Intravenous  Once 04/01/22 2249 04/02/22 0031      Subjective: Patient was seen and examined at bedside.  Overnight events noted.   Patient reports doing much better.  Wants  to be discharged to SNF if bed is available.  Objective: Vitals:   04/10/22 2032 04/10/22 2120 04/11/22 0301 04/11/22 0745  BP:  116/61  115/68  Pulse:  91  83  Resp:  20  16  Temp:  98.3 F (36.8 C)  97.6 F (36.4 C)  TempSrc:      SpO2: 95% 94% 95% 94%  Weight:      Height:        Intake/Output Summary (Last 24 hours) at 04/11/2022 1518 Last data filed at 04/11/2022 1200 Gross per 24 hour  Intake 120 ml  Output 2000 ml  Net -1880 ml   Filed Weights   04/01/22 2206  Weight: 72.1 kg    Examination:  General exam: Appears calm and comfortable, not in any acute distress. Respiratory system: Clear to auscultation. Respiratory effort normal. RR 16 Cardiovascular system: S1 & S2 heard, regular rate and rhythm, no murmur. Gastrointestinal system: Abdomen is soft, non tender, non distended, BS+ Central nervous system: Alert and oriented X 3. No focal neurological deficits. Extremities: No edema, No cyanosis, no clubbing. Skin: No rashes, lesions or ulcers Psychiatry: Judgement and insight appear normal. Mood & affect appropriate.     Data Reviewed: I have personally reviewed following labs and imaging studies  CBC: Recent Labs  Lab 04/05/22 0433 04/06/22 0416 04/07/22 0554 04/08/22 0532 04/11/22 0739  WBC 10.6* 11.7* 12.0* 14.1* 10.1  HGB 13.2 13.7 14.1 14.8 14.2  HCT 40.9 41.9 43.1 46.0 45.0  MCV 87.8 87.1 88.0 88.3 89.5  PLT 249 283 297 305 981   Basic Metabolic Panel: Recent Labs  Lab 04/05/22 0433 04/06/22 0416 04/07/22 0554 04/09/22 0457 04/11/22 0739  NA 133* 135 135  --  136  K 3.9 4.1 4.0  --  4.2  CL 102 103 103  --  101  CO2 26 24 24   --  29  GLUCOSE 290* 195* 232*  --  157*  BUN 26* 26* 31*  --  24*  CREATININE 1.01 0.87 1.05 1.04 0.84  CALCIUM 8.8* 8.8* 8.8*  --  8.7*  MG  --  2.3 2.2  --  2.2  PHOS  --  3.4 3.4  --  3.4   GFR: Estimated Creatinine Clearance: 74.3 mL/min (by C-G formula based on SCr of 0.84 mg/dL). Liver Function  Tests: Recent Labs  Lab 04/06/22 0416 04/07/22 0554  AST 19 24  ALT 19 29  ALKPHOS 55 59  BILITOT 0.6 0.5  PROT 6.4* 6.3*  ALBUMIN 3.1* 3.0*   No results for input(s): "LIPASE", "AMYLASE" in the last 168 hours. No results for input(s): "AMMONIA" in the last 168 hours. Coagulation Profile: No results for input(s): "INR", "PROTIME" in the last 168 hours. Cardiac Enzymes: No results for input(s): "CKTOTAL", "CKMB", "CKMBINDEX", "TROPONINI" in the last 168 hours. BNP (last 3 results) No results for input(s): "PROBNP" in the last 8760 hours. HbA1C: No results for input(s): "  HGBA1C" in the last 72 hours. CBG: Recent Labs  Lab 04/10/22 1127 04/10/22 1647 04/10/22 2122 04/11/22 0748 04/11/22 1143  GLUCAP 216* 202* 181* 155* 252*   Lipid Profile: No results for input(s): "CHOL", "HDL", "LDLCALC", "TRIG", "CHOLHDL", "LDLDIRECT" in the last 72 hours. Thyroid Function Tests: No results for input(s): "TSH", "T4TOTAL", "FREET4", "T3FREE", "THYROIDAB" in the last 72 hours. Anemia Panel: No results for input(s): "VITAMINB12", "FOLATE", "FERRITIN", "TIBC", "IRON", "RETICCTPCT" in the last 72 hours. Sepsis Labs: Recent Labs  Lab 04/08/22 0532  PROCALCITON <0.10    Recent Results (from the past 240 hour(s))  Blood culture (routine x 2)     Status: None   Collection Time: 04/01/22 10:08 PM   Specimen: BLOOD  Result Value Ref Range Status   Specimen Description BLOOD RIGHT FA  Final   Special Requests   Final    BOTTLES DRAWN AEROBIC AND ANAEROBIC Blood Culture results may not be optimal due to an inadequate volume of blood received in culture bottles   Culture   Final    NO GROWTH 5 DAYS Performed at [facility], [address] [place],  [address]    Report Status 04/06/2022 FINAL  Final  Resp panel by RT-PCR (RSV, Flu A&B, Covid) Anterior Nasal Swab     Status: None   Collection Time: 04/01/22 10:14 PM   Specimen: Anterior Nasal Swab  Result Value Ref  Range Status   SARS Coronavirus 2 by RT PCR NEGATIVE NEGATIVE Final    Comment: (NOTE) SARS-CoV-2 target nucleic acids are NOT DETECTED.  The SARS-CoV-2 RNA is generally detectable in upper respiratory specimens during the acute phase of infection. The lowest concentration of SARS-CoV-2 viral copies this assay can detect is 138 copies/mL. A negative result does not preclude SARS-Cov-2 infection and should not be used as the sole basis for treatment or other patient management decisions. A negative result may occur with  improper specimen collection/handling, submission of specimen other than nasopharyngeal swab, presence of viral mutation(s) within the areas targeted by this assay, and inadequate number of viral copies(<138 copies/mL). A negative result must be combined with clinical observations, patient history, and epidemiological information. The expected result is Negative.  Fact Sheet for Patients:  EntrepreneurPulse.com.au  Fact Sheet for Healthcare Providers:  IncredibleEmployment.be  This test is no t yet approved or cleared by the [place] FDA and  has been authorized for detection and/or diagnosis of SARS-CoV-2 by FDA under an Emergency Use Authorization (EUA). This EUA will remain  in effect (meaning this test can be used) for the duration of the COVID-19 declaration under Section 564(b)(1) of the Act, 21 U.S.C.section 360bbb-3(b)(1), unless the authorization is terminated  or revoked sooner.       Influenza A by PCR NEGATIVE NEGATIVE Final   Influenza B by PCR NEGATIVE NEGATIVE Final    Comment: (NOTE) The Xpert Xpress SARS-CoV-2/FLU/RSV plus assay is intended as an aid in the diagnosis of influenza from Nasopharyngeal swab specimens and should not be used as a sole basis for treatment. Nasal washings and aspirates are unacceptable for Xpert Xpress SARS-CoV-2/FLU/RSV testing.  Fact Sheet for  Patients: EntrepreneurPulse.com.au  Fact Sheet for Healthcare Providers: IncredibleEmployment.be  This test is not yet approved or cleared by the [place] FDA and has been authorized for detection and/or diagnosis of SARS-CoV-2 by FDA under an Emergency Use Authorization (EUA). This EUA will remain in effect (meaning this test can be used) for the duration of the COVID-19 declaration under Section  564(b)(1) of the Act, 21 U.S.C. section 360bbb-3(b)(1), unless the authorization is terminated or revoked.     Resp Syncytial Virus by PCR NEGATIVE NEGATIVE Final    Comment: (NOTE) Fact Sheet for Patients: EntrepreneurPulse.com.au  Fact Sheet for Healthcare Providers: IncredibleEmployment.be  This test is not yet approved or cleared by the [place] FDA and has been authorized for detection and/or diagnosis of SARS-CoV-2 by FDA under an Emergency Use Authorization (EUA). This EUA will remain in effect (meaning this test can be used) for the duration of the COVID-19 declaration under Section 564(b)(1) of the Act, 21 U.S.C. section 360bbb-3(b)(1), unless the authorization is terminated or revoked.  Performed at [facility], [address] [place], [place] [address]   Blood culture (routine x 2)     Status: None   Collection Time: 04/02/22  9:34 PM   Specimen: BLOOD RIGHT ARM  Result Value Ref Range Status   Specimen Description BLOOD RIGHT ARM  Final   Special Requests   Final    BOTTLES DRAWN AEROBIC AND ANAEROBIC Blood Culture adequate volume   Culture   Final    NO GROWTH 5 DAYS Performed at [facility], [address], [place], [place] [address]    Report Status 04/07/2022 FINAL  Final    Radiology Studies: No results found.  Scheduled Meds:  aspirin EC  81 mg Oral QPM   brimonidine  1 drop Both Eyes BID   enoxaparin (LOVENOX) injection  40 mg Subcutaneous Q24H    famotidine  20 mg Oral Daily   feeding supplement (GLUCERNA SHAKE)  237 mL Oral TID BM   fluticasone  1 spray Each Nare Daily   guaiFENesin  600 mg Oral BID   insulin aspart  0-15 Units Subcutaneous TID AC & HS   insulin glargine-yfgn  15 Units Subcutaneous Daily   ipratropium-albuterol  3 mL Nebulization Q6H   metoprolol tartrate  50 mg Oral BID   mometasone-formoterol  2 puff Inhalation BID   montelukast  10 mg Oral QHS   multivitamin with minerals  1 tablet Oral Daily   rosuvastatin  40 mg Oral BH-q7a   sodium chloride flush  3 mL Intravenous Q12H   Continuous Infusions:   LOS: 9 days    Time spent: 50 mins    Angelyn Osterberg, MD [facility]   If 7PM-7AM, please contact night-coverage

## 2022-04-11 NOTE — Discharge Instructions (Signed)
Advised to continue current medications as prescribed.

## 2022-04-11 NOTE — TOC Progression Note (Addendum)
Transition of Care [facility]) - Progression Note    Patient Details  Name: Bradley [name] MRN: [id number] Date of Birth: [birth year]  Transition of Care [facility]) CM/SW Contact  Beverly Sessions, RN Phone Number: 04/11/2022, 1:28 PM  Clinical Narrative:     Received notification that insurance is offering peer to peer that has to be completed by 5pm today.  MD updated  Patient update and he request that I notify his daughter - VM left  Expected Discharge Plan: Home/Self Care Barriers to Discharge: Continued Medical Work up  Expected Discharge Plan and Services       Living arrangements for the past 2 months: Single Family Home                                       Social Determinants of Health (SDOH) Interventions SDOH Screenings   Food Insecurity: No Food Insecurity (04/02/2022)  Housing: Low Risk  (04/02/2022)  Transportation Needs: No Transportation Needs (04/02/2022)  Utilities: Not At Risk (04/02/2022)  Depression (PHQ2-9): Low Risk  (07/13/2018)  Tobacco Use: Medium Risk (04/08/2022)    Readmission Risk Interventions    04/04/2022    3:56 PM 02/07/2021    3:13 PM 07/31/2020   10:04 AM  Readmission Risk Prevention Plan  Transportation Screening Complete Complete Complete  PCP or Specialist Appt within 3-5 Days Complete Complete Complete  HRI or Home Care Consult Complete Complete Complete  Social Work Consult for [place] Planning/Counseling Complete Complete Not Complete  SW consult not completed comments   RNCM assigned to patient  Palliative Care Screening Not Applicable Not Applicable Not Applicable  Medication Review (RN Care Manager) Complete Complete Complete

## 2022-04-11 NOTE — Progress Notes (Signed)
Nutrition Follow-up  DOCUMENTATION CODES:   Non-severe (moderate) malnutrition in context of chronic illness  INTERVENTION:   -Continue MVI with minerals daily -Continue Glucerna Shake po TID, each supplement provides 220 kcal and 10 grams of protein   NUTRITION DIAGNOSIS:   Moderate Malnutrition related to chronic illness (COPD, lung cancer) as evidenced by mild fat depletion, moderate fat depletion, mild muscle depletion, moderate muscle depletion.  Ongoing  GOAL:   Patient will meet greater than or equal to 90% of their needs  Progressing   MONITOR:   PO intake, Supplement acceptance  REASON FOR ASSESSMENT:   Consult Assessment of nutrition requirement/status  ASSESSMENT:   Pt with past medical history of COPD and hypertension plus non-small cell lung cancer getting monthly radiation therapy and chronic respiratory failure on 4 L nasal cannula who presented with complaints of several days of progressively worsening shortness of breath and productive cough with brown sputum.  Reviewed I/O's: -1.2 L x 24 hours and -12.9 L since admission  UOP: 1.8 L x 24 hours   Pt sleeping at time of visit, but awoke when RD entered room. Pt reports feeling better and continues with good appetite. Pt has drank 2 Glucerna shakes and 100% of breakfast this morning. Documented meal completions recorded at 100%. Discussed importance of good meal and supplement intake to promote healing; pt amenable to continue current nutrition plan of care.  Medications reviewed.   Pt awaiting insurance authorization for SNF.   Labs reviewed: CBGS: [id number] (inpatient orders for glycemic control are 0-15 units insulin aspart TID before meals and at bedtime and 15 units insulin glargine-yfgn daily).    Diet Order:   Diet Order             Diet - low sodium heart healthy           Diet Carb Modified           Diet Carb Modified Fluid consistency: Thin; Room service appropriate? Yes  Diet effective  now                   EDUCATION NEEDS:   Education needs have been addressed  Skin:  Skin Assessment: Reviewed RN Assessment  Last BM:  04/10/22  Height:   Ht Readings from Last 1 Encounters:  04/01/22 5\' 7"  (1.702 m)    Weight:   Wt Readings from Last 1 Encounters:  04/01/22 72.1 kg    Ideal Body Weight:  67.3 kg  BMI:  Body mass index is 24.9 kg/m.  Estimated Nutritional Needs:   Kcal:  2150-2350  Protein:  105-120 grams  Fluid:  > 2 L    Loistine Chance, RD, LDN, [place] Registered Dietitian II Certified Diabetes Care and Education Specialist Please refer to [facility] for RD and/or RD on-call/weekend/after hours pager

## 2022-04-11 NOTE — Discharge Summary (Signed)
Physician Discharge Summary  Bradley [name] TKW:[id number] DOB: 1950/07/20 DOA: 04/01/2022  PCP: Valera Castle, MD  Admit date: 04/01/2022  Discharge date: 04/12/22  Admitted From: Home.  Disposition:  SNF [facility])  Recommendations for Outpatient Follow-up:  Follow up with PCP in 1-2 weeks. Please obtain BMP/CBC in one week. Advised to continue current medications as prescribed. He completed the course of antibiotics for pneumonia.  Home Health:None Equipment/Devices:Home oxygen @ 4l/min  Discharge Condition: Stable CODE STATUS:Full code Diet recommendation: Heart Healthy   Brief [facility] Course: This 72 year old male with past medical history of COPD and hypertension plus non-small cell lung cancer getting monthly radiation therapy and chronic respiratory failure on 4 L nasal cannula who presented to the emergency room on 04/01/22 with complaints of several days of progressively worsening shortness of breath and productive cough with brown sputum.  In the emergency room, workup revealed patient with sepsis secondary to pneumonia and patient started on IV fluids and antibiotics.  Initially patient requiring high flow nasal oxygen as high as 40 L.  Over the next few days, he has gradually improved and is currently down to 6 L nasal cannula (nonhigh flow).  Weaned back to 4 L of oxygen which is his baseline.  Completed a course of antibiotic for pneumonia.  Patient becoming hypoxic with minor exertion. PT evaluation was obtained and they are recommending SNF.  Patient feels better , medically stable and patient is being discharged to SNF.  Discharge Diagnoses:  Principal Problem:   Pneumonia Active Problems:   Acute on chronic respiratory failure with hypoxia (HCC)   Sepsis due to pneumonia [facility])   Essential hypertension   Non-small cell lung cancer (NSCLC) (HCC)   Lactic acidosis  Severe sepsis secondary to pneumonia:  Patient meets criteria with sepsis on  admission given tachypnea, tachycardia, pneumonia source, respiratory failure.  Has responded well to antibiotics and oxygen able to be weaned down, currently on 6 L, regular O2.  Have added incentive spirometer   Acute on chronic respiratory failure with hypoxia:  Continues to improve.  Baseline of 4 L nasal cannula.   History of non-small cell lung cancer on radiation therapy:  Stable, not on any oral chemotherapy agents   Diabetes mellitus: Mildly elevated blood sugars.  A1c notes moderate control at 7.5.  Titrating up Lantus.   Moderate protein calorie malnutrition: Nutrition Status: Nutrition Problem: Moderate Malnutrition Etiology: chronic illness (COPD, lung cancer) Signs/Symptoms: mild fat depletion, moderate fat depletion, mild muscle depletion, moderate muscle depletion Interventions: Glucerna shake, MVI   Essential hypertension: Resume home metoprolol  Discharge Instructions  Discharge Instructions     Call MD for:  difficulty breathing, headache or visual disturbances   Complete by: As directed    Call MD for:  persistant nausea and vomiting   Complete by: As directed    Diet - low sodium heart healthy   Complete by: As directed    Diet Carb Modified   Complete by: As directed    Discharge instructions   Complete by: As directed    Advised to follow up PCP in one week. Advised to continue current medications as prescribed.   Increase activity slowly   Complete by: As directed       Allergies as of 04/12/2022       Reactions   Ace Inhibitors Other (See Comments)   Hypotension        Medication List     STOP taking these medications  albuterol 108 (90 Base) MCG/ACT inhaler Commonly known as: VENTOLIN HFA   fluticasone-salmeterol 250-50 MCG/ACT Aepb Commonly known as: ADVAIR   nitroGLYCERIN 0.4 MG SL tablet Commonly known as: NITROSTAT   tiotropium 18 MCG inhalation capsule Commonly known as: SPIRIVA       TAKE these medications     aspirin EC 81 MG tablet Take 81 mg by mouth every evening.   brimonidine 0.2 % ophthalmic solution Commonly known as: ALPHAGAN Place 1 drop into both eyes 2 (two) times daily.   famotidine 20 MG tablet Commonly known as: PEPCID Take 20 mg by mouth daily.   furosemide 20 MG tablet Commonly known as: LASIX Take 20 mg by mouth as needed for edema.   guaiFENesin 100 MG/5ML liquid Commonly known as: ROBITUSSIN Take 5 mLs by mouth every 6 (six) hours as needed for cough or to loosen phlegm.   ipratropium-albuterol 0.5-2.5 (3) MG/3ML Soln Commonly known as: DUONEB Take 3 mLs by nebulization every 4 (four) hours as needed.   levalbuterol 0.31 MG/3ML nebulizer solution Commonly known as: XOPENEX Take 1 ampule by nebulization every 4 (four) hours as needed for wheezing.   linagliptin 5 MG Tabs tablet Commonly known as: TRADJENTA Take 5 mg by mouth every morning.   metFORMIN 750 MG 24 hr tablet Commonly known as: GLUCOPHAGE-XR Take 750 mg by mouth 2 (two) times daily.   metoprolol tartrate 50 MG tablet Commonly known as: LOPRESSOR Take 50 mg by mouth 2 (two) times daily.   montelukast 10 MG tablet Commonly known as: SINGULAIR Take 10 mg by mouth at bedtime.   rosuvastatin 40 MG tablet Commonly known as: CRESTOR Take 40 mg by mouth every morning.   sildenafil 20 MG tablet Commonly known as: REVATIO Take 20-100 mg by mouth as needed.   Trelegy Ellipta 100-62.5-25 MCG/ACT Aepb Generic drug: Fluticasone-Umeclidin-Vilant Inhale 1 puff into the lungs daily.        Contact information for follow-up providers     Olmedo, Guy Begin, MD Follow up in 1 week(s).   Specialty: Family Medicine Contact information: [place] [address] [phone]              Contact information for after-discharge care     Destination     [facility] .   Service: Skilled Nursing Contact information: [place] [phone]                    Allergies  Allergen Reactions   Ace Inhibitors Other (See Comments)    Hypotension    Consultations: None   Procedures/Studies: CT ABDOMEN PELVIS W CONTRAST  Result Date: 04/01/2022 CLINICAL DATA:  Upper abdominal pain EXAM: CT ABDOMEN AND PELVIS WITH CONTRAST TECHNIQUE: Multidetector CT imaging of the abdomen and pelvis was performed using the standard protocol following bolus administration of intravenous contrast. RADIATION DOSE REDUCTION: This exam was performed according to the departmental dose-optimization program which includes automated exposure control, adjustment of the mA and/or kV according to patient size and/or use of iterative reconstruction technique. CONTRAST:  152mL OMNIPAQUE IOHEXOL 300 MG/ML  SOLN COMPARISON:  Partial comparison to CTA chest dated 02/18/2022. PET-CT dated 08/28/2021. CT abdomen/pelvis dated 02/05/2021. FINDINGS: Lower chest: Focal patchy opacities in the medial right lower lobe (series 4/image 27), new/progressive from recent CTA chest, likely reflecting radiation changes in this patient on SBRT. Extensive centrilobular and paraseptal emphysematous changes in the visualized lungs. Known pulmonary nodules are less  conspicuous on the current study. Hepatobiliary: Liver is within normal limits. Gallbladder is unremarkable. No intrahepatic or extrahepatic ductal dilatation. Pancreas: Within normal limits. Spleen: Within normal limits. Adrenals/Urinary Tract: Adrenal glands are within normal limits. Right kidney is within normal limits. Multiple simple left renal cysts measuring up to 3.7 cm in the left lower kidney (series 2/image 40), benign (Bosniak I). No follow-up is recommended. No hydronephrosis. Mildly thick-walled bladder, chronic, likely reflecting chronic bladder outlet obstruction. Stomach/Bowel: Stomach is within normal limits. No evidence of bowel obstruction. Normal appendix  (series 2/image 49). Sigmoid diverticulosis, without evidence of diverticulitis. Vascular/Lymphatic: No evidence of abdominal aortic aneurysm. Atherosclerotic calcifications of the abdominal aorta and branch vessels. No suspicious abdominopelvic lymphadenopathy. Reproductive: Prostatomegaly, with mild margin of the central gland indenting the base of the bladder, suggesting BPH. Other: No abdominopelvic ascites. Mild fat in the bilateral inguinal canals. Musculoskeletal: Mild degenerative changes at L5-S1. IMPRESSION: No CT findings to account for the patient's upper abdominal pain. Focal patchy opacities in the medial right lower lobe, new/progressive from recent CTA chest, likely reflecting radiation changes in this patient on SBRT. Additional ancillary findings as above. Aortic Atherosclerosis (ICD10-I70.0) and Emphysema (ICD10-J43.9). Electronically Signed   By: Julian Hy M.D.   On: 04/01/2022 23:46   DG Chest Portable 1 View  Result Date: 04/01/2022 CLINICAL DATA:  Shortness of breath EXAM: PORTABLE CHEST 1 VIEW COMPARISON:  CTA chest 02/18/2022 and radiographs 02/17/2022 FINDINGS: Stable cardiomediastinal silhouette. Aortic atherosclerotic calcification. Hyperinflation and chronic bronchitic changes. Small right pleural effusion or pleural thickening. New focal airspace opacity in the right lower lung medially. Pleural-parenchymal scarring in the apices. IMPRESSION: Findings suspicious for right lower lung pneumonia superimposed on severe emphysema. Electronically Signed   By: Placido Sou M.D.   On: 04/01/2022 22:31    Subjective: Patient was seen and examined at bedside.  Overnight events noted.  Patient report doing much better and wants to be discharged.  Patient being discharged home to SNF.  Discharge Exam: Vitals:   04/12/22 0747 04/12/22 0757  BP:  (!) 108/56  Pulse: 83 82  Resp: 18 16  Temp:  98.9 F (37.2 C)  SpO2: 96% 92%   Vitals:   04/11/22 2348 04/12/22 0258  04/12/22 0747 04/12/22 0757  BP: 98/66   (!) 108/56  Pulse: 81  83 82  Resp: 18  18 16   Temp: 98.3 F (36.8 C)   98.9 F (37.2 C)  TempSrc:      SpO2: 97% 95% 96% 92%  Weight:      Height:        General: Pt is alert, awake, not in acute distress Cardiovascular: RRR, S1/S2 +, no rubs, no gallops Respiratory: CTA bilaterally, no wheezing, no rhonchi Abdominal: Soft, NT, ND, bowel sounds + Extremities: no edema, no cyanosis    The results of significant diagnostics from this hospitalization (including imaging, microbiology, ancillary and laboratory) are listed below for reference.     Microbiology: Recent Results (from the past 240 hour(s))  Blood culture (routine x 2)     Status: None   Collection Time: 04/02/22  9:34 PM   Specimen: BLOOD RIGHT ARM  Result Value Ref Range Status   Specimen Description BLOOD RIGHT ARM  Final   Special Requests   Final    BOTTLES DRAWN AEROBIC AND ANAEROBIC Blood Culture adequate volume   Culture   Final    NO GROWTH 5 DAYS Performed at [facility], [address] [place], [place] [address]  Report Status 04/07/2022 FINAL  Final     Labs: BNP (last 3 results) Recent Labs    02/17/22 0948 04/01/22 2208 04/08/22 0532  BNP 180.0* 68.3 73.7   Basic Metabolic Panel: Recent Labs  Lab 04/06/22 0416 04/07/22 0554 04/09/22 0457 04/11/22 0739  NA 135 135  --  136  K 4.1 4.0  --  4.2  CL 103 103  --  101  CO2 24 24  --  29  GLUCOSE 195* 232*  --  157*  BUN 26* 31*  --  24*  CREATININE 0.87 1.05 1.04 0.84  CALCIUM 8.8* 8.8*  --  8.7*  MG 2.3 2.2  --  2.2  PHOS 3.4 3.4  --  3.4   Liver Function Tests: Recent Labs  Lab 04/06/22 0416 04/07/22 0554  AST 19 24  ALT 19 29  ALKPHOS 55 59  BILITOT 0.6 0.5  PROT 6.4* 6.3*  ALBUMIN 3.1* 3.0*   No results for input(s): "LIPASE", "AMYLASE" in the last 168 hours. No results for input(s): "AMMONIA" in the last 168 hours. CBC: Recent Labs  Lab 04/06/22 0416  04/07/22 0554 04/08/22 0532 04/11/22 0739  WBC 11.7* 12.0* 14.1* 10.1  HGB 13.7 14.1 14.8 14.2  HCT 41.9 43.1 46.0 45.0  MCV 87.1 88.0 88.3 89.5  PLT 283 297 305 300   Cardiac Enzymes: No results for input(s): "CKTOTAL", "CKMB", "CKMBINDEX", "TROPONINI" in the last 168 hours. BNP: Invalid input(s): "POCBNP" CBG: Recent Labs  Lab 04/11/22 1143 04/11/22 1642 04/11/22 2039 04/11/22 2111 04/12/22 0854  GLUCAP 252* 113* 215* 221* 266*   D-Dimer No results for input(s): "DDIMER" in the last 72 hours. Hgb A1c No results for input(s): "HGBA1C" in the last 72 hours. Lipid Profile No results for input(s): "CHOL", "HDL", "LDLCALC", "TRIG", "CHOLHDL", "LDLDIRECT" in the last 72 hours. Thyroid function studies No results for input(s): "TSH", "T4TOTAL", "T3FREE", "THYROIDAB" in the last 72 hours.  Invalid input(s): "FREET3" Anemia work up No results for input(s): "VITAMINB12", "FOLATE", "FERRITIN", "TIBC", "IRON", "RETICCTPCT" in the last 72 hours. Urinalysis    Component Value Date/Time   COLORURINE YELLOW (A) 03/01/2021 1938   APPEARANCEUR CLOUDY (A) 03/01/2021 1938   LABSPEC 1.016 03/01/2021 1938   PHURINE 5.0 03/01/2021 1938   GLUCOSEU >=500 (A) 03/01/2021 1938   HGBUR SMALL (A) 03/01/2021 1938   BILIRUBINUR NEGATIVE 03/01/2021 1938   KETONESUR NEGATIVE 03/01/2021 1938   PROTEINUR 100 (A) 03/01/2021 1938   NITRITE NEGATIVE 03/01/2021 1938   LEUKOCYTESUR NEGATIVE 03/01/2021 1938   Sepsis Labs Recent Labs  Lab 04/06/22 0416 04/07/22 0554 04/08/22 0532 04/11/22 0739  WBC 11.7* 12.0* 14.1* 10.1   Microbiology Recent Results (from the past 240 hour(s))  Blood culture (routine x 2)     Status: None   Collection Time: 04/02/22  9:34 PM   Specimen: BLOOD RIGHT ARM  Result Value Ref Range Status   Specimen Description BLOOD RIGHT ARM  Final   Special Requests   Final    BOTTLES DRAWN AEROBIC AND ANAEROBIC Blood Culture adequate volume   Culture   Final    NO GROWTH  5 DAYS Performed at [facility], [address], [place], [place] [address]    Report Status 04/07/2022 FINAL  Final     Time coordinating discharge: Over 30 minutes  SIGNED:   Shawna Clamp, MD  [facility] 04/12/2022, 11:45 AM Pager   If 7PM-7AM, please contact night-coverage

## 2022-04-11 NOTE — TOC Progression Note (Signed)
Transition of Care [facility]) - Progression Note    Patient Details  Name: [name] MRN: [id number] Date of Birth: 06/15/1950  Transition of [facility]) CM/SW Contact  Beverly Sessions, RN Phone Number: 04/11/2022, 10:55 AM  Clinical Narrative:     Insurance auth pending    Expected Discharge Plan: Home/Self Care Barriers to Discharge: Continued Medical Work up  Expected Discharge Plan and Services       Living arrangements for the past 2 months: Single Family Home                                       Social Determinants of Health (SDOH) Interventions SDOH Screenings   Food Insecurity: No Food Insecurity (04/02/2022)  Housing: Low Risk  (04/02/2022)  Transportation Needs: No Transportation Needs (04/02/2022)  Utilities: Not At Risk (04/02/2022)  Depression (PHQ2-9): Low Risk  (07/13/2018)  Tobacco Use: Medium Risk (04/08/2022)    Readmission Risk Interventions    04/04/2022    3:56 PM 02/07/2021    3:13 PM 07/31/2020   10:04 AM  Readmission Risk Prevention Plan  Transportation Screening Complete Complete Complete  PCP or Specialist Appt within 3-5 Days Complete Complete Complete  HRI or Home Care Consult Complete Complete Complete  Social Work Consult for [place] Planning/Counseling Complete Complete Not Complete  SW consult not completed comments   RNCM assigned to patient  Palliative Care Screening Not Applicable Not Applicable Not Applicable  Medication Review (RN Care Manager) Complete Complete Complete

## 2022-04-11 NOTE — Progress Notes (Signed)
Physical Therapy Treatment Patient Details Name: Bradley [name] MRN: [id number] DOB: Jan 22, 1951 Today's Date: 04/11/2022   History of Present Illness presented to ER secondary to progressive cough, SOB, nausea/vomiting; admitted for mangement of acute/chronic respiratory failure with hypoxia, severe sepsis related to PNA    PT Comments    Patient seated edge of bed upon arrival to room; recovering from recent trip to/from bathroom.  Moderately SOB in unsupported sitting; sats 84-85% on 5L.  Assisted with transfer to recliner for additional postural support to aid in respiratory recovery, requiring >5 min for sats 89-90% on 5L. Able reciprocal stepping pattern, decreased cadence and overall gait speed; mod SOB with minimal distance despite cuing for pursed lip breathing. Desat to 84% on 6L with minimal gait distance (requiring 3-5 min for recovery). Less anxiety, energy expenditure with use of RW  Patient motivated to improve, but remains globally limited by functional endurance/cardiorespiratory deficits; generally unable to demonstrate ability to complete ADLs/mobility necessary to facilitate safe return home alone.  Recovered to 89% on 5L end of session.    Recommendations for follow up therapy are one component of a multi-disciplinary discharge planning process, led by the attending physician.  Recommendations may be updated based on patient status, additional functional criteria and insurance authorization.  Follow Up Recommendations  Skilled nursing-short term rehab (<3 hours/day) Can patient physically be transported by private vehicle: Yes   Assistance Recommended at Discharge Frequent or constant Supervision/Assistance  Patient can return home with the following A little help with walking and/or transfers;A little help with bathing/dressing/bathroom   Equipment Recommendations  Rolling walker (2 wheels)    Recommendations for Other Services       Precautions / Restrictions  Precautions Precautions: Fall Restrictions Weight Bearing Restrictions: No     Mobility  Bed Mobility               General bed mobility comments: seated edge of bed upon arrival to session; in recliner end of session    Transfers Overall transfer level: Needs assistance Equipment used: Rolling walker (2 wheels) Transfers: Sit to/from Stand, Bed to chair/wheelchair/BSC Sit to Stand: Min guard, Supervision Stand pivot transfers: Min guard         General transfer comment: does require UE support for lift off, stabilization with all mobility tasks    Ambulation/Gait Ambulation/Gait assistance: Min guard Gait Distance (Feet): 20 Feet Assistive device: Rolling walker (2 wheels)         General Gait Details: reciprocal stepping pattern, decreased cadence and overall gait speed; mod SOB with minimal distance despite cuing for pursed lip breathing.  Desat to 84% on 6L with minimal gait distance (requiring 3-5 min for recovery).  Less anxiety, energy expenditure with use of RW   Stairs             Wheelchair Mobility    Modified Rankin (Stroke Patients Only)       Balance Overall balance assessment: Needs assistance Sitting-balance support: No upper extremity supported, Feet supported Sitting balance-Leahy Scale: Good     Standing balance support: Bilateral upper extremity supported Standing balance-Leahy Scale: Fair                              Cognition Arousal/Alertness: Awake/alert Behavior During Therapy: WFL for tasks assessed/performed Overall Cognitive Status: Within Functional Limits for tasks assessed  Exercises Other Exercises Other Exercises: Educated in use of incentive spirometer for pulmonary hygiene; returned demonstration of good understanding. Encouraged use outside of therapy as tolerated.    General Comments        Pertinent Vitals/Pain Pain  Assessment Pain Assessment: No/denies pain    Home Living                          Prior Function            PT Goals (current goals can now be found in the care plan section) Acute Rehab PT Goals Patient Stated Goal: to return home PT Goal Formulation: With patient Time For Goal Achievement: 04/23/22 Potential to Achieve Goals: Fair Progress towards PT goals: Progressing toward goals    Frequency    Min 2X/week      PT Plan Current plan remains appropriate    Co-evaluation              AM-PAC PT "6 Clicks" Mobility   Outcome Measure  Help needed turning from your back to your side while in a flat bed without using bedrails?: None Help needed moving from lying on your back to sitting on the side of a flat bed without using bedrails?: None Help needed moving to and from a bed to a chair (including a wheelchair)?: A Little Help needed standing up from a chair using your arms (e.g., wheelchair or bedside chair)?: A Little Help needed to walk in hospital room?: A Little Help needed climbing 3-5 steps with a railing? : A Lot 6 Click Score: 19    End of Session   Activity Tolerance: Treatment limited secondary to medical complications (Comment) (desat with minimal activity/exertino) Patient left: in chair;with chair alarm set;with call bell/phone within reach Nurse Communication: Mobility status PT Visit Diagnosis: Muscle weakness (generalized) (M62.81);Difficulty in walking, not elsewhere classified (R26.2)     Time: 0569-7948 PT Time Calculation (min) (ACUTE ONLY): 31 min  Charges:  $Gait Training: 8-22 mins $Therapeutic Exercise: 8-22 mins                    Ryen Rhames H. Owens Shark, PT, DPT, NCS 04/11/22, 5:34 PM [phone]

## 2022-04-11 NOTE — Plan of Care (Signed)
  Problem: Education: Goal: Knowledge of General Education information will improve Description: Including pain rating scale, medication(s)/side effects and non-pharmacologic comfort measures Outcome: Progressing   Problem: Health Behavior/Discharge Planning: Goal: Ability to manage health-related needs will improve Outcome: Progressing   Problem: Clinical Measurements: Goal: Diagnostic test results will improve Outcome: Progressing Goal: Respiratory complications will improve Outcome: Progressing   Problem: Activity: Goal: Risk for activity intolerance will decrease Outcome: Progressing   Problem: Pain Managment: Goal: General experience of comfort will improve Outcome: Progressing   Problem: Safety: Goal: Ability to remain free from injury will improve Outcome: Progressing   Problem: Skin Integrity: Goal: Risk for impaired skin integrity will decrease Outcome: Progressing

## 2022-04-12 DIAGNOSIS — J189 Pneumonia, unspecified organism: Secondary | ICD-10-CM | POA: Diagnosis not present

## 2022-04-12 LAB — GLUCOSE, CAPILLARY
Glucose-Capillary: 266 mg/dL — ABNORMAL HIGH (ref 70–99)
Glucose-Capillary: 166 mg/dL — ABNORMAL HIGH (ref 70–99)

## 2022-04-12 MED ORDER — IPRATROPIUM-ALBUTEROL 0.5-2.5 (3) MG/3ML IN SOLN: 3.0000 mL | Freq: Two times a day (BID) | RESPIRATORY_TRACT | Status: DC

## 2022-04-12 NOTE — Inpatient Diabetes Management (Signed)
Inpatient Diabetes Program Recommendations  AACE/ADA: New Consensus Statement on Inpatient Glycemic Control   Target Ranges:  Prepandial:   less than 140 mg/dL      Peak postprandial:   less than 180 mg/dL (1-2 hours)      Critically ill patients:  140 - 180 mg/dL    Latest Reference Range & Units 04/11/22 07:48 04/11/22 11:43 04/11/22 16:42 04/11/22 20:39 04/11/22 21:11 04/12/22 08:54  Glucose-Capillary 70 - 99 mg/dL 155 (H) 252 (H) 113 (H) 215 (H) 221 (H) 266 (H)   Review of Glycemic Control  Diabetes history: DM2 Outpatient Diabetes medications: Metformin XR 750 mg BID, Tradjenta 5 mg daily Current orders for Inpatient glycemic control: Semglee 15 units daily, Novolog 0-15 units TID with meals and HS  Inpatient Diabetes Program Recommendations:    Insulin: Please consider ordering Novolog 3 units TID with meals for meal coverage if patient eats at least 50% of meals.  Thanks, Barnie Alderman, RN, MSN, [place] Diabetes Coordinator Inpatient Diabetes Program [phone] (Team Pager from 8am to Section)

## 2022-04-12 NOTE — Progress Notes (Signed)
Report called to Candace at [facility]. All questions answered. All belongings gathered. Daughter will transport patient to facility.

## 2022-04-12 NOTE — TOC Progression Note (Signed)
Transition of Care [facility]) - Progression Note    Patient Details  Name: BASHIR MARCHETTI MRN: [id number] Date of Birth: 04-30-1950  Transition of Care [facility]) CM/SW [place], LCSW Phone Number: 04/12/2022, 8:26 AM  Clinical Narrative: Uploaded yesterday's PT note into [place] portal for review.    Expected Discharge Plan: Home/Self Care Barriers to Discharge: Continued Medical Work up  Expected Discharge Plan and Services       Living arrangements for the past 2 months: Single Family Home                                       Social Determinants of Health (SDOH) Interventions SDOH Screenings   Food Insecurity: No Food Insecurity (04/02/2022)  Housing: Low Risk  (04/02/2022)  Transportation Needs: No Transportation Needs (04/02/2022)  Utilities: Not At Risk (04/02/2022)  Depression (PHQ2-9): Low Risk  (07/13/2018)  Tobacco Use: Medium Risk (04/08/2022)    Readmission Risk Interventions    04/04/2022    3:56 PM 02/07/2021    3:13 PM 07/31/2020   10:04 AM  Readmission Risk Prevention Plan  Transportation Screening Complete Complete Complete  PCP or Specialist Appt within 3-5 Days Complete Complete Complete  HRI or Home Care Consult Complete Complete Complete  Social Work Consult for [place] Planning/Counseling Complete Complete Not Complete  SW consult not completed comments   RNCM assigned to patient  Palliative Care Screening Not Applicable Not Applicable Not Applicable  Medication Review (RN Care Manager) Complete Complete Complete

## 2022-04-12 NOTE — TOC Progression Note (Signed)
Transition of Care [facility]) - Progression Note    Patient Details  Name: Bradley [name] MRN: [id number] Date of Birth: 13-May-1950  Transition of Care [facility]) CM/SW Contact  Beverly Sessions, RN Phone Number: 04/12/2022, 9:59 AM  Clinical Narrative:    Insurance auth pending    Expected Discharge Plan: Home/Self Care Barriers to Discharge: Continued Medical Work up  Expected Discharge Plan and Services       Living arrangements for the past 2 months: Single Family Home                                       Social Determinants of Health (SDOH) Interventions SDOH Screenings   Food Insecurity: No Food Insecurity (04/02/2022)  Housing: Low Risk  (04/02/2022)  Transportation Needs: No Transportation Needs (04/02/2022)  Utilities: Not At Risk (04/02/2022)  Depression (PHQ2-9): Low Risk  (07/13/2018)  Tobacco Use: Medium Risk (04/08/2022)    Readmission Risk Interventions    04/04/2022    3:56 PM 02/07/2021    3:13 PM 07/31/2020   10:04 AM  Readmission Risk Prevention Plan  Transportation Screening Complete Complete Complete  PCP or Specialist Appt within 3-5 Days Complete Complete Complete  HRI or Home Care Consult Complete Complete Complete  Social Work Consult for [place] Planning/Counseling Complete Complete Not Complete  SW consult not completed comments   RNCM assigned to patient  Palliative Care Screening Not Applicable Not Applicable Not Applicable  Medication Review (RN Care Manager) Complete Complete Complete

## 2022-04-12 NOTE — Care Management Important Message (Signed)
Important Message  Patient Details  Name: Bradley [name] MRN: [id number] Date of Birth: October 26, 1950   Medicare Important Message Given:  Yes     Dannette Barbara 04/12/2022, 12:02 PM

## 2022-04-12 NOTE — TOC Transition Note (Signed)
Transition of Care [facility]) - CM/SW Discharge Note   Patient Details  Name: Bradley [name] MRN: [id number] Date of Birth: Nov 14, 1950  Transition of Care [facility]) CM/SW Contact:  Beverly Sessions, RN Phone Number: 04/12/2022, 12:54 PM   Clinical Narrative:     Insurance auth received for SNF  Patient will [facility] to[facility] Anticipated DC date:04/12/22  Family notified:daughter Animator XJ:DBZM and will bring portable O2  Per MD patient ready for DC to . RN, patient, patient's family, and facility notified of DC. Discharge Summary sent to facility. RN given number for report. DC packet on chart.TOC signing off.  Bradley [name] [facility] [phone]     Barriers to Discharge: Continued Medical Work up   Patient Goals and CMS Choice      Discharge Placement                         Discharge Plan and Services Additional resources added to the After Visit Summary for                                       Social Determinants of Health (SDOH) Interventions SDOH Screenings   Food Insecurity: No Food Insecurity (04/02/2022)  Housing: [place]  (04/02/2022)  Transportation Needs: No Transportation Needs (04/02/2022)  Utilities: Not At Risk (04/02/2022)  Depression (PHQ2-9): Low Risk  (07/13/2018)  Tobacco Use: Medium Risk (04/08/2022)     Readmission Risk Interventions    04/04/2022    3:56 PM 02/07/2021    3:13 PM 07/31/2020   10:04 AM  Readmission Risk Prevention Plan  Transportation Screening Complete Complete Complete  PCP or Specialist Appt within 3-5 Days Complete Complete Complete  HRI or Home Care Consult Complete Complete Complete  Social Work Consult for [place] Planning/Counseling Complete Complete Not Complete  SW consult not completed comments   RNCM assigned to patient  Palliative Care Screening Not Applicable Not Applicable Not Applicable  Medication Review (RN Care Manager) Complete Complete Complete

## 2022-04-14 LAB — BLOOD GAS, VENOUS
Acid-Base Excess: 2.5 mmol/L — ABNORMAL HIGH (ref 0.0–2.0)
Bicarbonate: 27.9 mmol/L (ref 20.0–28.0)
O2 Saturation: 30.4 %
Patient temperature: 37
pCO2, Ven: 45 mmHg (ref 44–60)
pH, Ven: 7.4 (ref 7.25–7.43)

## 2022-04-16 ENCOUNTER — Ambulatory Visit: Payer: Medicare HMO | Admitting: Radiation Oncology

## 2022-04-18 ENCOUNTER — Encounter: Payer: Self-pay | Admitting: Radiation Oncology

## 2022-04-18 ENCOUNTER — Ambulatory Visit
Admission: RE | Admit: 2022-04-18 | Discharge: 2022-04-18 | Disposition: A | Discharge status: Home / Self Care | Payer: Medicare HMO | Source: Ambulatory Visit | Attending: Radiation Oncology | Admitting: Radiation Oncology

## 2022-04-18 VITALS — BP 118/70 | HR 86 | Temp 96.4°F | Resp 22 | Ht 67.0 in

## 2022-04-18 DIAGNOSIS — R918 Other nonspecific abnormal finding of lung field: Secondary | ICD-10-CM | POA: Insufficient documentation

## 2022-04-18 NOTE — Progress Notes (Signed)
Radiation Oncology Follow up Note  Name: Bradley [name]   Date:   04/18/2022 MRN:  [id number] DOB: 08/05/1950    This 72 y.o. male presents to the clinic today for 1 month follow-up status post SBRT for stage I non-small cell lung cancer of the right lower lobe and patient with extensive COPD emphysema.  REFERRING PROVIDER: Valera Castle, *  HPI: Patient is a 72 year old male now out 1 month having completed SBRT to his right lower lobe.  He had a recent hospitalization for pneumonia is slowly getting back to that is now down to 3 L of nasal oxygen.  He specifically [place] cough hemoptysis or chest tightness..  He had sepsis due to pneumonia was on antibiotic therapy and responded.  COMPLICATIONS OF TREATMENT: none  FOLLOW UP COMPLIANCE: keeps appointments   PHYSICAL EXAM:  BP 118/70   Pulse 86   Temp (!) 96.4 F (35.8 C)   Resp (!) 22   Ht 5\' 7"  (1.702 m)   SpO2 94%   BMI 24.90 kg/m  Frail-appearing male on nasal oxygen in NAD.  Well-developed well-nourished patient in NAD. HEENT reveals PERLA, EOMI, discs not visualized.  Oral cavity is clear. No oral mucosal lesions are identified. Neck is clear without evidence of cervical or supraclavicular adenopathy. Lungs are clear to A&P. Cardiac examination is essentially unremarkable with regular rate and rhythm without murmur rub or thrill. Abdomen is benign with no organomegaly or masses noted. Motor sensory and DTR levels are equal and symmetric in the upper and lower extremities. Cranial nerves II through XII are grossly intact. Proprioception is intact. No peripheral adenopathy or edema is identified. No motor or sensory levels are noted. Crude visual fields are within normal range.  RADIOLOGY RESULTS: No current films for review  PLAN: Present time patient is recovering from the recent pneumonia.  I have asked to see him back in 3 months with a repeat CT scan of his chest at that time.  Patient is to call with any concerns at any  time.  I would like to take this opportunity to thank you for allowing me to participate in the care of your patient.Noreene Filbert, MD

## 2022-06-05 ENCOUNTER — Other Ambulatory Visit: Payer: Self-pay | Admitting: Specialist

## 2022-06-05 DIAGNOSIS — J441 Chronic obstructive pulmonary disease with (acute) exacerbation: Secondary | ICD-10-CM

## 2022-06-05 DIAGNOSIS — R918 Other nonspecific abnormal finding of lung field: Secondary | ICD-10-CM

## 2022-06-06 ENCOUNTER — Other Ambulatory Visit: Payer: Self-pay

## 2022-06-06 ENCOUNTER — Inpatient Hospital Stay
Admission: EM | Admit: 2022-06-06 | Discharge: 2022-06-12 | DRG: 190 | Disposition: A | Discharge status: Hospice | Payer: Medicare HMO | Attending: Internal Medicine | Admitting: Internal Medicine

## 2022-06-06 ENCOUNTER — Emergency Department: Payer: Medicare HMO

## 2022-06-06 ENCOUNTER — Encounter: Payer: Self-pay | Admitting: Emergency Medicine

## 2022-06-06 DIAGNOSIS — Z1152 Encounter for screening for COVID-19: Secondary | ICD-10-CM

## 2022-06-06 DIAGNOSIS — Z923 Personal history of irradiation: Secondary | ICD-10-CM | POA: Diagnosis not present

## 2022-06-06 DIAGNOSIS — I10 Essential (primary) hypertension: Secondary | ICD-10-CM | POA: Diagnosis present

## 2022-06-06 DIAGNOSIS — F4323 Adjustment disorder with mixed anxiety and depressed mood: Secondary | ICD-10-CM | POA: Diagnosis present

## 2022-06-06 DIAGNOSIS — J441 Chronic obstructive pulmonary disease with (acute) exacerbation: Secondary | ICD-10-CM | POA: Diagnosis present

## 2022-06-06 DIAGNOSIS — T380X5A Adverse effect of glucocorticoids and synthetic analogues, initial encounter: Secondary | ICD-10-CM | POA: Diagnosis present

## 2022-06-06 DIAGNOSIS — J9621 Acute and chronic respiratory failure with hypoxia: Secondary | ICD-10-CM | POA: Diagnosis present

## 2022-06-06 DIAGNOSIS — J9601 Acute respiratory failure with hypoxia: Secondary | ICD-10-CM

## 2022-06-06 DIAGNOSIS — Z9981 Dependence on supplemental oxygen: Secondary | ICD-10-CM

## 2022-06-06 DIAGNOSIS — N1831 Chronic kidney disease, stage 3a: Secondary | ICD-10-CM | POA: Diagnosis present

## 2022-06-06 DIAGNOSIS — E1165 Type 2 diabetes mellitus with hyperglycemia: Secondary | ICD-10-CM | POA: Diagnosis present

## 2022-06-06 DIAGNOSIS — Z888 Allergy status to other drugs, medicaments and biological substances status: Secondary | ICD-10-CM

## 2022-06-06 DIAGNOSIS — Z87891 Personal history of nicotine dependence: Secondary | ICD-10-CM | POA: Diagnosis not present

## 2022-06-06 DIAGNOSIS — Z809 Family history of malignant neoplasm, unspecified: Secondary | ICD-10-CM | POA: Diagnosis not present

## 2022-06-06 DIAGNOSIS — Z8249 Family history of ischemic heart disease and other diseases of the circulatory system: Secondary | ICD-10-CM

## 2022-06-06 DIAGNOSIS — Z7984 Long term (current) use of oral hypoglycemic drugs: Secondary | ICD-10-CM | POA: Diagnosis not present

## 2022-06-06 DIAGNOSIS — Z515 Encounter for palliative care: Secondary | ICD-10-CM

## 2022-06-06 DIAGNOSIS — Z7982 Long term (current) use of aspirin: Secondary | ICD-10-CM

## 2022-06-06 DIAGNOSIS — I129 Hypertensive chronic kidney disease with stage 1 through stage 4 chronic kidney disease, or unspecified chronic kidney disease: Secondary | ICD-10-CM | POA: Diagnosis present

## 2022-06-06 DIAGNOSIS — C3491 Malignant neoplasm of unspecified part of right bronchus or lung: Secondary | ICD-10-CM | POA: Diagnosis not present

## 2022-06-06 DIAGNOSIS — R54 Age-related physical debility: Secondary | ICD-10-CM | POA: Diagnosis present

## 2022-06-06 DIAGNOSIS — E872 Acidosis, unspecified: Secondary | ICD-10-CM | POA: Diagnosis present

## 2022-06-06 DIAGNOSIS — E785 Hyperlipidemia, unspecified: Secondary | ICD-10-CM | POA: Diagnosis not present

## 2022-06-06 DIAGNOSIS — E1122 Type 2 diabetes mellitus with diabetic chronic kidney disease: Secondary | ICD-10-CM | POA: Diagnosis present

## 2022-06-06 DIAGNOSIS — E1151 Type 2 diabetes mellitus with diabetic peripheral angiopathy without gangrene: Secondary | ICD-10-CM | POA: Diagnosis present

## 2022-06-06 DIAGNOSIS — E1129 Type 2 diabetes mellitus with other diabetic kidney complication: Secondary | ICD-10-CM | POA: Diagnosis present

## 2022-06-06 DIAGNOSIS — I2510 Atherosclerotic heart disease of native coronary artery without angina pectoris: Secondary | ICD-10-CM | POA: Diagnosis present

## 2022-06-06 DIAGNOSIS — F1010 Alcohol abuse, uncomplicated: Secondary | ICD-10-CM | POA: Diagnosis present

## 2022-06-06 DIAGNOSIS — Z79899 Other long term (current) drug therapy: Secondary | ICD-10-CM

## 2022-06-06 DIAGNOSIS — G4730 Sleep apnea, unspecified: Secondary | ICD-10-CM | POA: Diagnosis present

## 2022-06-06 DIAGNOSIS — C3490 Malignant neoplasm of unspecified part of unspecified bronchus or lung: Secondary | ICD-10-CM | POA: Diagnosis present

## 2022-06-06 LAB — CBC WITH DIFFERENTIAL/PLATELET
Abs Immature Granulocytes: 0.07 10*3/uL (ref 0.00–0.07)
Basophils Absolute: 0.1 10*3/uL (ref 0.0–0.1)
Basophils Relative: 0 %
Eosinophils Absolute: 0.1 10*3/uL (ref 0.0–0.5)
Eosinophils Relative: 1 %
HCT: 52.3 % — ABNORMAL HIGH (ref 39.0–52.0)
Hemoglobin: 16.3 g/dL (ref 13.0–17.0)
Immature Granulocytes: 1 %
Lymphocytes Relative: 11 %
Lymphs Abs: 1.5 10*3/uL (ref 0.7–4.0)
MCH: 28.3 pg (ref 26.0–34.0)
MCHC: 31.2 g/dL (ref 30.0–36.0)
MCV: 90.8 fL (ref 80.0–100.0)
Monocytes Absolute: 1.4 10*3/uL — ABNORMAL HIGH (ref 0.1–1.0)
Monocytes Relative: 11 %
Neutro Abs: 10.5 10*3/uL — ABNORMAL HIGH (ref 1.7–7.7)
Neutrophils Relative %: 76 %
Platelets: 269 10*3/uL (ref 150–400)
RBC: 5.76 MIL/uL (ref 4.22–5.81)
RDW: 15.7 % — ABNORMAL HIGH (ref 11.5–15.5)
WBC: 13.7 10*3/uL — ABNORMAL HIGH (ref 4.0–10.5)
nRBC: 0 % (ref 0.0–0.2)

## 2022-06-06 LAB — COMPREHENSIVE METABOLIC PANEL
ALT: 38 U/L (ref 0–44)
AST: 38 U/L (ref 15–41)
Albumin: 4.2 g/dL (ref 3.5–5.0)
Alkaline Phosphatase: 125 U/L (ref 38–126)
Anion gap: 12 (ref 5–15)
BUN: 12 mg/dL (ref 8–23)
CO2: 21 mmol/L — ABNORMAL LOW (ref 22–32)
Calcium: 9.4 mg/dL (ref 8.9–10.3)
Chloride: 106 mmol/L (ref 98–111)
Creatinine, Ser: 1.17 mg/dL (ref 0.61–1.24)
GFR, Estimated: >60 mL/min (ref >60)
Glucose, Bld: 166 mg/dL — ABNORMAL HIGH (ref 70–99)
Potassium: 3.9 mmol/L (ref 3.5–5.1)
Sodium: 139 mmol/L (ref 135–145)
Total Bilirubin: 1.5 mg/dL — ABNORMAL HIGH (ref 0.3–1.2)
Total Protein: 8.3 g/dL — ABNORMAL HIGH (ref 6.5–8.1)

## 2022-06-06 LAB — EXPECTORATED SPUTUM ASSESSMENT W GRAM STAIN, RFLX TO RESP C

## 2022-06-06 LAB — LACTIC ACID, PLASMA
Lactic Acid, Venous: 3.4 mmol/L (ref 0.5–1.9)
Lactic Acid, Venous: 1.4 mmol/L (ref 0.5–1.9)

## 2022-06-06 LAB — PROTIME-INR
INR: 1.1 (ref 0.8–1.2)
Prothrombin Time: 13.7 seconds (ref 11.4–15.2)

## 2022-06-06 LAB — SARS CORONAVIRUS 2 BY RT PCR: SARS Coronavirus 2 by RT PCR: NEGATIVE

## 2022-06-06 LAB — PROCALCITONIN: Procalcitonin: <0.1 ng/mL

## 2022-06-06 LAB — APTT: aPTT: 30 seconds (ref 24–36)

## 2022-06-06 LAB — CBG MONITORING, ED: Glucose-Capillary: 243 mg/dL — ABNORMAL HIGH (ref 70–99)

## 2022-06-06 MED ORDER — THIAMINE MONONITRATE 100 MG PO TABS: 100.0000 mg | ORAL_TABLET | Freq: Every day | ORAL | Status: DC

## 2022-06-06 MED ORDER — DM-GUAIFENESIN ER 30-600 MG PO TB12
1.0000 | ORAL_TABLET | Freq: Two times a day (BID) | ORAL | Status: DC | PRN
  Administered 2022-06-07 – 2022-06-08 (×2): 1 via ORAL
  Filled 2022-06-06 (×3): qty 1

## 2022-06-06 MED ORDER — SODIUM CHLORIDE 0.9 % IV BOLUS
500.0000 mL | Freq: Once | INTRAVENOUS | Status: AC
  Administered 2022-06-06: 500 mL via INTRAVENOUS

## 2022-06-06 MED ORDER — LORAZEPAM 2 MG/ML IJ SOLN: 0.0000 mg | Freq: Two times a day (BID) | INTRAMUSCULAR | Status: DC

## 2022-06-06 MED ORDER — IPRATROPIUM-ALBUTEROL 0.5-2.5 (3) MG/3ML IN SOLN
9.0000 mL | Freq: Once | RESPIRATORY_TRACT | Status: AC
  Administered 2022-06-06: 9 mL via RESPIRATORY_TRACT
  Filled 2022-06-06: qty 3

## 2022-06-06 MED ORDER — INSULIN ASPART 100 UNIT/ML IJ SOLN
0.0000 [IU] | Freq: Three times a day (TID) | INTRAMUSCULAR | Status: DC
  Administered 2022-06-07: 5 [IU] via SUBCUTANEOUS
  Administered 2022-06-07: 7 [IU] via SUBCUTANEOUS
  Filled 2022-06-06 (×2): qty 1

## 2022-06-06 MED ORDER — LORAZEPAM 1 MG PO TABS: 1.0000 mg | ORAL_TABLET | ORAL | Status: DC | PRN

## 2022-06-06 MED ORDER — MONTELUKAST SODIUM 10 MG PO TABS
10.0000 mg | ORAL_TABLET | Freq: Every day | ORAL | Status: DC
  Administered 2022-06-06 – 2022-06-11 (×6): 10 mg via ORAL
  Filled 2022-06-06 (×6): qty 1

## 2022-06-06 MED ORDER — LORAZEPAM 2 MG/ML IJ SOLN: 1.0000 mg | INTRAMUSCULAR | Status: DC | PRN

## 2022-06-06 MED ORDER — METOPROLOL TARTRATE 50 MG PO TABS
50.0000 mg | ORAL_TABLET | Freq: Two times a day (BID) | ORAL | Status: DC
  Administered 2022-06-06 – 2022-06-11 (×11): 50 mg via ORAL
  Filled 2022-06-06 (×12): qty 1

## 2022-06-06 MED ORDER — SODIUM CHLORIDE 0.9 % IV SOLN
500.0000 mg | Freq: Once | INTRAVENOUS | Status: AC
  Administered 2022-06-06: 500 mg via INTRAVENOUS
  Filled 2022-06-06: qty 5

## 2022-06-06 MED ORDER — ALBUTEROL SULFATE (2.5 MG/3ML) 0.083% IN NEBU: 2.5000 mg | INHALATION_SOLUTION | RESPIRATORY_TRACT | Status: DC | PRN

## 2022-06-06 MED ORDER — FOLIC ACID 1 MG PO TABS
1.0000 mg | ORAL_TABLET | Freq: Every day | ORAL | Status: DC
  Administered 2022-06-06 – 2022-06-12 (×7): 1 mg via ORAL
  Filled 2022-06-06 (×7): qty 1

## 2022-06-06 MED ORDER — HYDRALAZINE HCL 20 MG/ML IJ SOLN: 5.0000 mg | INTRAMUSCULAR | Status: DC | PRN

## 2022-06-06 MED ORDER — METHYLPREDNISOLONE SODIUM SUCC 125 MG IJ SOLR
125.0000 mg | Freq: Once | INTRAMUSCULAR | Status: AC
  Administered 2022-06-06: 125 mg via INTRAVENOUS
  Filled 2022-06-06: qty 2

## 2022-06-06 MED ORDER — ENOXAPARIN SODIUM 40 MG/0.4ML IJ SOSY
40.0000 mg | PREFILLED_SYRINGE | INTRAMUSCULAR | Status: DC
  Administered 2022-06-06 – 2022-06-11 (×6): 40 mg via SUBCUTANEOUS
  Filled 2022-06-06 (×6): qty 0.4

## 2022-06-06 MED ORDER — FAMOTIDINE 20 MG PO TABS
20.0000 mg | ORAL_TABLET | Freq: Every day | ORAL | Status: DC
  Administered 2022-06-07 – 2022-06-12 (×6): 20 mg via ORAL
  Filled 2022-06-06 (×6): qty 1

## 2022-06-06 MED ORDER — SODIUM CHLORIDE 0.9 % IV BOLUS
1000.0000 mL | Freq: Once | INTRAVENOUS | Status: AC
  Administered 2022-06-06: 1000 mL via INTRAVENOUS

## 2022-06-06 MED ORDER — ACETAMINOPHEN 325 MG PO TABS
650.0000 mg | ORAL_TABLET | Freq: Four times a day (QID) | ORAL | Status: DC | PRN
  Administered 2022-06-07 – 2022-06-08 (×2): 650 mg via ORAL
  Filled 2022-06-06 (×2): qty 2

## 2022-06-06 MED ORDER — THIAMINE HCL 100 MG/ML IJ SOLN
100.0000 mg | Freq: Every day | INTRAMUSCULAR | Status: DC
  Administered 2022-06-06 – 2022-06-08 (×3): 100 mg via INTRAVENOUS
  Filled 2022-06-06 (×3): qty 2

## 2022-06-06 MED ORDER — INSULIN ASPART 100 UNIT/ML IJ SOLN
0.0000 [IU] | Freq: Every day | INTRAMUSCULAR | Status: DC
  Administered 2022-06-06 – 2022-06-08 (×3): 2 [IU] via SUBCUTANEOUS
  Administered 2022-06-09: 3 [IU] via SUBCUTANEOUS
  Administered 2022-06-10: 4 [IU] via SUBCUTANEOUS
  Administered 2022-06-11: 3 [IU] via SUBCUTANEOUS
  Filled 2022-06-06 (×6): qty 1

## 2022-06-06 MED ORDER — SODIUM CHLORIDE 0.9 % IV SOLN
1.0000 g | Freq: Once | INTRAVENOUS | Status: AC
  Administered 2022-06-06: 1 g via INTRAVENOUS
  Filled 2022-06-06: qty 10

## 2022-06-06 MED ORDER — THIAMINE HCL 100 MG/ML IJ SOLN: 100.0000 mg | Freq: Every day | INTRAMUSCULAR | Status: DC

## 2022-06-06 MED ORDER — AZITHROMYCIN 250 MG PO TABS
250.0000 mg | ORAL_TABLET | Freq: Every day | ORAL | Status: DC
  Administered 2022-06-07 – 2022-06-12 (×6): 250 mg via ORAL
  Filled 2022-06-06 (×6): qty 1

## 2022-06-06 MED ORDER — ADULT MULTIVITAMIN W/MINERALS CH
1.0000 | ORAL_TABLET | Freq: Every day | ORAL | Status: DC
  Administered 2022-06-06 – 2022-06-12 (×7): 1 via ORAL
  Filled 2022-06-06 (×7): qty 1

## 2022-06-06 MED ORDER — IPRATROPIUM-ALBUTEROL 0.5-2.5 (3) MG/3ML IN SOLN
3.0000 mL | RESPIRATORY_TRACT | Status: DC
  Administered 2022-06-06 – 2022-06-10 (×23): 3 mL via RESPIRATORY_TRACT
  Filled 2022-06-06 (×23): qty 3

## 2022-06-06 MED ORDER — ROSUVASTATIN CALCIUM 10 MG PO TABS
40.0000 mg | ORAL_TABLET | ORAL | Status: DC
  Administered 2022-06-07 – 2022-06-12 (×6): 40 mg via ORAL
  Filled 2022-06-06: qty 4
  Filled 2022-06-06: qty 2
  Filled 2022-06-06 (×5): qty 4

## 2022-06-06 MED ORDER — METHYLPREDNISOLONE SODIUM SUCC 125 MG IJ SOLR
80.0000 mg | INTRAMUSCULAR | Status: DC
  Administered 2022-06-06 – 2022-06-07 (×2): 80 mg via INTRAVENOUS
  Filled 2022-06-06 (×2): qty 2

## 2022-06-06 MED ORDER — ASPIRIN 81 MG PO TBEC
81.0000 mg | DELAYED_RELEASE_TABLET | Freq: Every evening | ORAL | Status: DC
  Administered 2022-06-07 – 2022-06-11 (×5): 81 mg via ORAL
  Filled 2022-06-06 (×5): qty 1

## 2022-06-06 MED ORDER — ONDANSETRON HCL 4 MG/2ML IJ SOLN: 4.0000 mg | Freq: Three times a day (TID) | INTRAMUSCULAR | Status: DC | PRN

## 2022-06-06 MED ORDER — LORAZEPAM 2 MG/ML IJ SOLN: 0.0000 mg | Freq: Four times a day (QID) | INTRAMUSCULAR | Status: DC

## 2022-06-06 NOTE — ED Notes (Signed)
When this RN assumed care of the pt, he was on a [place] for breathing and had just, according to the day shift nurse, come off of bipap. Pt's oxygen was 87-91% and not consistently staying up in the 90's.  Attempted the simple mask and pt's oxygen level would improve briefly to 91% but the pt would dip back down to 87%.  Pt then placed on a non-re-breather where is oxygen level has remained stable. Pt states he feels more comfortable too with this.   RT was called earlier to review pt, unaware if they have see pt yet.

## 2022-06-06 NOTE — ED Triage Notes (Signed)
Pt sts that he started to get SOB yesterday. Pt presents on 4l/min via [place] at 80%. RN placed pt on NRB at 10l/min. Sat 88% at this time.

## 2022-06-06 NOTE — ED Provider Triage Note (Signed)
Emergency Medicine Provider Triage Evaluation Note  Bradley [name] , a 72 y.o. male  was evaluated in triage.  Pt complains of dif breathing.  Review of Systems  Positive: sob Negative: fever  Physical Exam  SpO2 (!) 80%  Gen:   Awake, dif breathing Resp:  Increased effort MSK:   Moves extremities without difficulty  Other:    Medical Decision Making  Medically screening exam initiated at 11:07 AM.  Appropriate orders placed.  Bradley [name] was informed that the remainder of the evaluation will be completed by another provider, this initial triage assessment does not replace that evaluation, and the importance of remaining in the ED until their evaluation is complete.  Pt on 4l 02 and 02 level is still 81% on rebreather, called charge to have pt put in room, going to room 10   Bradley Starks, PA-C 06/06/22 1109

## 2022-06-06 NOTE — ED Provider Notes (Signed)
[facility] Provider Note    Event Date/Time   First MD Initiated Contact with Patient 06/06/22 1109     (approximate)   History   Shortness of Breath   HPI  Bradley [name] is a 72 y.o. male   Past medical history of COPD, alcohol use, CAD, diabetes, hypertension hyperlipidemia, PAD, CKD who presents emergency department with productive cough and shortness of breath worsening over the last 2 days.  Chronically on 4 L O2 and was 81% thus increased o2 requiring nonrebreather.  Took a albuterol inhaler with some mild relief but very transient.  No fever no chest pain.  He denies orthopnea.  Has been compliant with his other medications.  He offers no other acute medical complaints.   External Medical Documents Reviewed: Charge summary from 04/12/2022 that also relays that he has non-small cell lung cancer getting monthly radiation therapy, and he was admitted at that time for pneumonia      Physical Exam   Triage Vital Signs: ED Triage Vitals  Enc Vitals Group     BP 06/06/22 1124 125/66     Pulse Rate 06/06/22 1106 83     Resp 06/06/22 1106 (!) 26     Temp 06/06/22 1106 97.9 F (36.6 C)     Temp Source 06/06/22 1106 Oral     SpO2 06/06/22 1103 (!) 80 %     Weight 06/06/22 1107 180 lb (81.6 kg)     Height --      Head Circumference --      Peak Flow --      Pain Score --      Pain Loc --      Pain Edu? --      Excl. in [place]? --     Most recent vital signs: Vitals:   06/06/22 1124 06/06/22 1136  BP: 125/66   Pulse:    Resp:    Temp:    SpO2:  97%    General: Awake, increased work of breathing tachypnea but awake alert and answering questions in short phrases CV:  Good peripheral perfusion.  No peripheral edema Resp:  Increased work of breathing, wheezing diffusely Abd:  No distention.  Other:  Vital signs significant for hypoxemia 88%, afebrile   ED Results / Procedures / Treatments   Labs (all labs ordered are listed, but only  abnormal results are displayed) Labs Reviewed  LACTIC ACID, PLASMA - Abnormal; Notable for the following components:      Result Value   Lactic Acid, Venous 3.4 (*)    All other components within normal limits  CBC WITH DIFFERENTIAL/PLATELET - Abnormal; Notable for the following components:   WBC 13.7 (*)    HCT 52.3 (*)    RDW 15.7 (*)    Neutro Abs 10.5 (*)    Monocytes Absolute 1.4 (*)    All other components within normal limits  CULTURE, BLOOD (ROUTINE X 2)  CULTURE, BLOOD (ROUTINE X 2)  SARS CORONAVIRUS 2 BY RT PCR  LACTIC ACID, PLASMA  COMPREHENSIVE METABOLIC PANEL  PROTIME-INR  APTT     I ordered and reviewed the above labs they are notable for white blood cell count is 13.7 lactic acidosis of 3.4  EKG  ED ECG REPORT I, Lucillie Garfinkel, the attending physician, personally viewed and interpreted this ECG.   Date: 06/06/2022  EKG Time: 1116  Rate: 103  Rhythm: sinus tachycardia  Axis: nl  Intervals:none  ST&T Change: No acute ischemic  changes, no obvious STEMI, though very limited EKG given increased work of breathing and significant artifact    RADIOLOGY I independently reviewed and interpreted CXR and I see no obvious focality or pneumothorax   PROCEDURES:  Critical Care performed: Yes, see critical care procedure note(s)  .Critical Care  Performed by: Lucillie Garfinkel, MD Authorized by: Lucillie Garfinkel, MD   Critical care provider statement:    Critical care time (minutes):  30   Critical care was necessary to treat or prevent imminent or life-threatening deterioration of the following conditions:  Respiratory failure   Critical care was time spent personally by me on the following activities:  Development of treatment plan with patient or surrogate, discussions with consultants, evaluation of patient's response to treatment, examination of patient, ordering and review of laboratory studies, ordering and review of radiographic studies, ordering and performing  treatments and interventions, pulse oximetry, re-evaluation of patient's condition and review of old [facility]: Medications  azithromycin (ZITHROMAX) 500 mg in sodium chloride 0.9 % 250 mL IVPB (500 mg Intravenous New Bag/Given 06/06/22 1201)  LORazepam (ATIVAN) tablet 1-4 mg (has no administration in time range)  thiamine (VITAMIN B1) tablet 100 mg (has no administration in time range)    Or  thiamine (VITAMIN B1) injection 100 mg (has no administration in time range)  folic acid (FOLVITE) tablet 1 mg (has no administration in time range)  multivitamin with minerals tablet 1 tablet (has no administration in time range)  sodium chloride 0.9 % bolus 1,000 mL (has no administration in time range)  ipratropium-albuterol (DUONEB) 0.5-2.5 (3) MG/3ML nebulizer solution 9 mL (9 mLs Nebulization Given 06/06/22 1135)  methylPREDNISolone sodium succinate (SOLU-MEDROL) 125 mg/2 mL injection 125 mg (125 mg Intravenous Given 06/06/22 1150)  cefTRIAXone (ROCEPHIN) 1 g in sodium chloride 0.9 % 100 mL IVPB (0 g Intravenous Stopped 06/06/22 1234)  sodium chloride 0.9 % bolus 500 mL (500 mLs Intravenous New Bag/Given 06/06/22 1154)    External physician / consultants:  I spoke with hospitalist for admission and  regarding care plan for this patient.   IMPRESSION / MDM / ASSESSMENT AND PLAN / ED COURSE  I reviewed the triage vital signs and the nursing notes.                                Patient's presentation is most consistent with acute presentation with potential threat to life or bodily function.  Differential diagnosis includes, but is not limited to, COPD exacerbation, acute hypoxemic respiratory failure, pneumonia, respiratory infection or viral URI, heart failure exacerbation, PE, ACS   The patient is on the cardiac monitor to evaluate for evidence of arrhythmia and/or significant heart rate changes.  MDM: This is a patient with severe COPD with exacerbation with wheezing  on auscultation productive cough and increased work of breathing with some transient relief from albuterol treatment earlier.  I put him on BiPAP for increased work of breathing and ordered for an DuoNebs as well as Solu-Medrol IV and he will require admission given severity of COPD exacerbation.  Given severity of his exacerbation I also ordered for community-acquired pneumonia coverage with IV ceftriaxone and IV azithromycin.  Patient rapidly improving feels much better on BiPAP now.  I considered ACS, PE, heart failure exacerbation, but I think these are less likely given his history of COPD, response to standard COPD treatment, no chest pain.  Alcohol use slowly  weaning at home, states 3 drinks per day but has not had a drink in 5 days.  Put on CIWA.  No evidence of withdrawal at this time. --  Also evidence of sepsis with increased respiratory rate and a blood pressure in the 0000000 systolic albeit this was a blood pressure obtained immediately after starting BiPAP.  He has an elevated white blood cell count and a lactic acidosis in the threes.  I ordered for a full 30 cc/kg ideal body weight fluid bolus as well as antibiotics directed towards pneumonia which is his most likely source.        FINAL CLINICAL IMPRESSION(S) / ED DIAGNOSES   Final diagnoses:  COPD exacerbation  Acute hypoxemic respiratory failure     Rx / DC Orders   ED Discharge Orders     None        Note:  This document was prepared using Dragon voice recognition software and may include unintentional dictation errors.    Lucillie Garfinkel, MD 06/06/22 [phone]

## 2022-06-06 NOTE — H&P (Signed)
History and Physical    Bradley [name] [id number] DOB: [birth year] DOA: 06/06/2022  Referring MD/NP/PA:   PCP: Valera Castle, MD   Patient coming from:  The patient is coming from home.     Chief Complaint: SOB  HPI: Bradley [name] is a 72 y.o. male with medical history significant of hypertension, hyperlipidemia, diabetes mellitus, COPD, on 4-5L of oxygen at home, depression with anxiety, CKD-3A, PVD, former smoker, alcohol abuse, CAD, NSCLC (s/p of  XRT), who presents with shortness breath.   Patient states that he started having shortness of breath since yesterday, which has been progressively worsening.  Patient has wheezing and cough with yellow-colored sputum production.  Reports chest tightness, no active chest pain.  No fever or chills.  No nausea, vomiting, diarrhea or abdominal pain.  No symptoms of UTI.  Patient was found to have severe respiratory distress.  Oxygen desaturation to 81% on 4 L oxygen, using accessory muscle for breathing, cannot speak in full sentence.  Initially patient was started on nonrebreather without significant improvement. BiPAP was started in ED.  Data reviewed independently and ED Course: pt was found to have WBC 13.7, lactic acid 3.4 --> 1.4, GFR> 60, temperature normal, blood pressure 125/66, heart rate 83, RR 26, chest x-ray showed hyperinflation and chronic lung disease without infiltration.  Patient is admitted to PCU as inpatient.      EKG: I have personally reviewed.  Too much artificial effects, seem to have right bundle blockade.  I will repeat EKG.   Review of Systems:   General: no fevers, chills, no body weight gain, fatigue HEENT: no blurry vision, hearing changes or sore throat Respiratory: has dyspnea, coughing, wheezing CV: no chest pain, no palpitations GI: no nausea, vomiting, abdominal pain, diarrhea, constipation GU: no dysuria, burning on urination, increased urinary frequency, hematuria  Ext: no leg edema Neuro: no  unilateral weakness, numbness, or tingling, no vision change or hearing loss Skin: no rash, no skin tear. MSK: No muscle spasm, no deformity, no limitation of range of movement in spin Heme: No easy bruising.  Travel history: No recent long distant travel.   Allergy:  Allergies  Allergen Reactions   Ace Inhibitors Other (See Comments)    Hypotension    Past Medical History:  Diagnosis Date   Alcohol use disorder    Anemia    Anxiety    COPD (chronic obstructive pulmonary disease)    Coronary artery disease    Depression    Diabetes mellitus without complication    Family history of adverse reaction to anesthesia    daughter-spinal headache afer epidural   History of tobacco abuse    Hyperlipidemia    Hypertension    Hyponatremia    Left carotid artery stenosis    Lung mass    Malnutrition    Nausea vomiting and diarrhea 02/17/2022   PAD (peripheral artery disease)    Pneumonia    Sleep apnea    does not use cpap   Spinal headache    after lung surgery   Stage 2 chronic kidney disease    Tachycardia     Past Surgical History:  Procedure Laterality Date   CAROTID STENT Left    LEFT HEART CATH AND CORONARY ANGIOGRAPHY Left 11/02/2019   Procedure: LEFT HEART CATH AND CORONARY ANGIOGRAPHY;  Surgeon: Teodoro Spray, MD;  Location: [facility];  Service: Cardiovascular;  Laterality: Left;   LUNG BIOPSY Right    2006   right  side partial lobe removed   1990    Social History:  reports that he quit smoking about 6 years ago. His smoking use included cigarettes. He has a 52.00 pack-year smoking history. He has never used smokeless tobacco. He reports that he does not currently use alcohol after a past usage of about 3.0 standard drinks of alcohol per week. He reports that he does not use drugs.  Family History:  Family History  Problem Relation Age of Onset   Cancer Mother    Heart attack Father    Cancer Sister      Prior to Admission medications    Medication Sig Start Date End Date Taking? Authorizing Provider  aspirin EC 81 MG tablet Take 81 mg by mouth every evening.     [provider]  brimonidine (ALPHAGAN) 0.2 % ophthalmic solution Place 1 drop into both eyes 2 (two) times daily.    [provider]  famotidine (PEPCID) 20 MG tablet Take 20 mg by mouth daily.    [provider]  furosemide (LASIX) 20 MG tablet Take 20 mg by mouth as needed for edema.    [provider]  guaiFENesin (ROBITUSSIN) 100 MG/5ML liquid Take 5 mLs by mouth every 6 (six) hours as needed for cough or to loosen phlegm. 02/21/22   Jennye Boroughs, MD  ipratropium-albuterol (DUONEB) 0.5-2.5 (3) MG/3ML SOLN Take 3 mLs by nebulization every 4 (four) hours as needed. 03/06/21   Hosie Poisson, MD  levalbuterol (XOPENEX) 0.31 MG/3ML nebulizer solution Take 1 ampule by nebulization every 4 (four) hours as needed for wheezing.    [provider]  linagliptin (TRADJENTA) 5 MG TABS tablet Take 5 mg by mouth every morning.    [provider]  metFORMIN (GLUCOPHAGE-XR) 750 MG 24 hr tablet Take 750 mg by mouth 2 (two) times daily.    [provider]  metoprolol tartrate (LOPRESSOR) 50 MG tablet Take 50 mg by mouth 2 (two) times daily. 11/21/19   [provider]  montelukast (SINGULAIR) 10 MG tablet Take 10 mg by mouth at bedtime. 01/09/21   [provider]  rosuvastatin (CRESTOR) 40 MG tablet Take 40 mg by mouth every morning. 08/05/19   [provider]  sildenafil (REVATIO) 20 MG tablet Take 20-100 mg by mouth as needed.    [provider]  TRELEGY ELLIPTA 100-62.5-25 MCG/ACT AEPB Inhale 1 puff into the lungs daily. 03/13/22   [provider]    Physical Exam: Vitals:   06/06/22 1600 06/06/22 1630 06/06/22 1652 06/06/22 1700  BP: 123/68 124/80  123/70  Pulse: 97 99  94  Resp: (!) 24 (!) 25  (!) 26  Temp:      TempSrc:      SpO2: 95% (!) 87% 96% 94%  Weight:        General: In acute respiratory distress HEENT:       Eyes: PERRL, EOMI, no scleral icterus.       ENT: No discharge from the ears and nose, no pharynx injection, no tonsillar enlargement.        Neck: No JVD, no bruit, no mass felt. Heme: No neck lymph node enlargement. Cardiac: S1/S2, RRR, No murmurs, No gallops or rubs. Respiratory: Has wheezing bilaterally GI: Soft, nondistended, nontender, no rebound pain, no organomegaly, BS present. GU: No hematuria Ext: No pitting leg edema bilaterally. 1+DP/PT pulse bilaterally. Musculoskeletal: No joint deformities, No joint redness or warmth, no limitation of ROM in spin. Skin: No rashes.  Neuro:  Alert, oriented X3, cranial nerves II-XII grossly intact, moves all extremities normally.  Psych: Patient is not psychotic, no suicidal or hemocidal ideation.  Labs on Admission: I have personally reviewed following labs and imaging studies  CBC: Recent Labs  Lab 06/06/22 1134  WBC 13.7*  NEUTROABS 10.5*  HGB 16.3  HCT 52.3*  MCV 90.8  PLT [id number]   Basic Metabolic Panel: Recent Labs  Lab 06/06/22 1134  NA 139  K 3.9  CL 106  CO2 21*  GLUCOSE 166*  BUN 12  CREATININE 1.17  CALCIUM 9.4   GFR: Estimated Creatinine Clearance: 58.4 mL/min (by C-G formula based on SCr of 1.17 mg/dL). Liver Function Tests: Recent Labs  Lab 06/06/22 1134  AST 38  ALT 38  ALKPHOS 125  BILITOT 1.5*  PROT 8.3*  ALBUMIN 4.2   No results for input(s): "LIPASE", "AMYLASE" in the last 168 hours. No results for input(s): "AMMONIA" in the last 168 hours. Coagulation Profile: Recent Labs  Lab 06/06/22 1151  INR 1.1   Cardiac Enzymes: No results for input(s): "CKTOTAL", "CKMB", "CKMBINDEX", "TROPONINI" in the last 168 hours. BNP (last 3 results) No results for input(s): "PROBNP" in the last 8760 hours. HbA1C: No results for input(s): "HGBA1C" in the last 72 hours. CBG: No results for input(s): "GLUCAP" in the last 168 hours. Lipid Profile: No  results for input(s): "CHOL", "HDL", "LDLCALC", "TRIG", "CHOLHDL", "LDLDIRECT" in the last 72 hours. Thyroid Function Tests: No results for input(s): "TSH", "T4TOTAL", "FREET4", "T3FREE", "THYROIDAB" in the last 72 hours. Anemia Panel: No results for input(s): "VITAMINB12", "FOLATE", "FERRITIN", "TIBC", "IRON", "RETICCTPCT" in the last 72 hours. Urine analysis:    Component Value Date/Time   COLORURINE YELLOW (A) 03/01/2021 1938   APPEARANCEUR CLOUDY (A) 03/01/2021 1938   LABSPEC 1.016 03/01/2021 1938   PHURINE 5.0 03/01/2021 1938   GLUCOSEU >=500 (A) 03/01/2021 1938   HGBUR SMALL (A) 03/01/2021 1938   BILIRUBINUR NEGATIVE 03/01/2021 1938   KETONESUR NEGATIVE 03/01/2021 1938   PROTEINUR 100 (A) 03/01/2021 1938   NITRITE NEGATIVE 03/01/2021 1938   LEUKOCYTESUR NEGATIVE 03/01/2021 1938   Sepsis Labs: @LABRCNTIP (procalcitonin:4,lacticidven:4) ) Recent Results (from the past 240 hour(s))  Blood Culture (routine x 2)     Status: None (Preliminary result)   Collection Time: 06/06/22 11:34 AM   Specimen: BLOOD  Result Value Ref Range Status   Specimen Description BLOOD BLOOD LEFT ARM  Final   Special Requests   Final    BOTTLES DRAWN AEROBIC AND ANAEROBIC Blood Culture results may not be optimal due to an inadequate volume of blood received in culture bottles   Culture   Final    NO GROWTH < 12 HOURS Performed at [facility], [address] [place], [place] [address]    Report Status PENDING  Incomplete  Blood Culture (routine x 2)     Status: None (Preliminary result)   Collection Time: 06/06/22 11:51 AM   Specimen: BLOOD  Result Value Ref Range Status   Specimen Description BLOOD LEFT ANTECUBITAL  Final   Special Requests   Final    BOTTLES DRAWN AEROBIC AND ANAEROBIC Blood Culture adequate volume   Culture   Final    NO GROWTH < 12 HOURS Performed at [facility], [address] [place], [place] [address]    Report Status PENDING  Incomplete  SARS  Coronavirus 2 by RT PCR (hospital order, performed in [facility] hospital lab) *cepheid single result test* Anterior Nasal Swab  Status: None   Collection Time: 06/06/22 11:52 AM   Specimen: Anterior Nasal Swab  Result Value Ref Range Status   SARS Coronavirus 2 by RT PCR NEGATIVE NEGATIVE Final    Comment: (NOTE) SARS-CoV-2 target nucleic acids are NOT DETECTED.  The SARS-CoV-2 RNA is generally detectable in upper and lower respiratory specimens during the acute phase of infection. The lowest concentration of SARS-CoV-2 viral copies this assay can detect is 250 copies / mL. A negative result does not preclude SARS-CoV-2 infection and should not be used as the sole basis for treatment or other patient management decisions.  A negative result may occur with improper specimen collection / handling, submission of specimen other than nasopharyngeal swab, presence of viral mutation(s) within the areas targeted by this assay, and inadequate number of viral copies (<250 copies / mL). A negative result must be combined with clinical observations, patient history, and epidemiological information.  Fact Sheet for Patients:   https://www.patel.info/  Fact Sheet for Healthcare Providers: https://hall.com/  This test is not yet approved or  cleared by the [place] FDA and has been authorized for detection and/or diagnosis of SARS-CoV-2 by FDA under an Emergency Use Authorization (EUA).  This EUA will remain in effect (meaning this test can be used) for the duration of the COVID-19 declaration under Section 564(b)(1) of the Act, 21 U.S.C. section 360bbb-3(b)(1), unless the authorization is terminated or revoked sooner.  Performed at [facility], [address] [place], [place] [address]   Expectorated Sputum Assessment w Gram Stain, Rflx to Resp Cult     Status: None   Collection Time: 06/06/22  3:01 PM   Specimen: Sputum   Result Value Ref Range Status   Specimen Description SPUTUM  Final   Special Requests NONE  Final   Sputum evaluation   Final    THIS SPECIMEN IS ACCEPTABLE FOR SPUTUM CULTURE Performed at [facility], [address], [place], [place] [address]    Report Status 06/06/2022 FINAL  Final     Radiological Exams on Admission: DG Chest Port 1 View  Result Date: 06/06/2022 CLINICAL DATA:  Questionable sepsis EXAM: PORTABLE CHEST 1 VIEW COMPARISON:  04/01/2022 and older.  CT angiogram 02/17/2022 FINDINGS: Hyperinflation with chronic lung changes again seen. There is blunting of the right costophrenic angle. Tiny effusion versus pleural thickening. Apical pleural thickening as well. No consolidation or edema. Previous subtle opacity of the right lung base is improving. Normal cardiopericardial silhouette. Calcified aorta. Overlapping cardiac leads. IMPRESSION: Hyperinflation with chronic lung changes. Tiny right effusion versus pleural thickening. Electronically Signed   By: Jill Side M.D.   On: 06/06/2022 12:09      Assessment/Plan Active Problems:   COPD exacerbation   Acute on chronic respiratory failure with hypoxia   Essential hypertension   HLD (hyperlipidemia)   Type II diabetes mellitus with renal manifestations   Chronic kidney disease, stage 3a   CAD (coronary artery disease)   CVD (cardiovascular disease)   Alcohol abuse   Adjustment disorder with mixed anxiety and depressed mood   Assessment and Plan:  Acute on chronic respiratory failure with hypoxia due to COPD exacerbation: Patient has productive cough, worsening shortness breath, wheezing on auscultation, consistent with a COPD exacerbation.  Chest x-ray negative for infiltration.  -will admit to PCU as inpatient -BiPAP --> try to wean off -Azithromycin 250 mg daily (patient received 1 dose of IV azithromycin and Rocephin in ED) -Bronchodilators -Solu-Medrol 40 mg IV bid -Mucinex for cough  -  Incentive  spirometry -sputum culture -check Covid19 -Nasal cannula oxygen as needed to maintain O2 saturation 93% or greater when pt is off BiPAP  Essential hypertension -IV hydralazine as needed -Metoprolol   HLD (hyperlipidemia) -Crestor   Type II diabetes mellitus with renal manifestations ([place]): A1c 7.5, poorly controlled.  Patient is taking metformin and Tradjenta -SSI   Chronic kidney disease, stage 3a ([place]): GFR > 60 today, stable -f/u by BMP   CAD (coronary artery disease) -ASA and Crestor   Non-small cell lung cancer (NSCLC) ([place] ): Patient is X/p of radiation therapy -Follow-up with oncology   Alcohol abuse -CIWA protocol  CVD (cardiovascular disease): -ASA and Crestor  Adjustment disorder with mixed anxiety and depressed mood:: Patient is not taking medications currently. -Observe closely    DVT ppx: SQ Lovenox  Code Status: Full code  Family Communication: Yes, patient's  daughter  at bed side.    Disposition Plan:  Anticipate discharge back to previous environment  Consults called:  none  Admission status and Level of care: Progressive:   as inpt      Dispo: The patient is from: Home              Anticipated d/c is to: Home              Anticipated d/c date is: 2 days              Patient currently is not medically stable to d/c.    Severity of Illness:  The appropriate patient status for this patient is INPATIENT. Inpatient status is judged to be reasonable and necessary in order to provide the required intensity of service to ensure the patient's safety. The patient's presenting symptoms, physical exam findings, and initial radiographic and laboratory data in the context of their chronic comorbidities is felt to place them at high risk for further clinical deterioration. Furthermore, it is not anticipated that the patient will be medically stable for discharge from the hospital within 2 midnights of admission.   * I certify that at the point of admission  it is my clinical judgment that the patient will require inpatient hospital care spanning beyond 2 midnights from the point of admission due to high intensity of service, high risk for further deterioration and high frequency of surveillance required.*       Date of Service 06/06/2022    Ivor [facility]   If 7PM-7AM, please contact night-coverage [email or web address] 06/06/2022, 5:59 PM

## 2022-06-07 DIAGNOSIS — J441 Chronic obstructive pulmonary disease with (acute) exacerbation: Secondary | ICD-10-CM | POA: Diagnosis not present

## 2022-06-07 LAB — BASIC METABOLIC PANEL
Anion gap: 6 (ref 5–15)
BUN: 21 mg/dL (ref 8–23)
CO2: 22 mmol/L (ref 22–32)
Calcium: 8.8 mg/dL — ABNORMAL LOW (ref 8.9–10.3)
Chloride: 110 mmol/L (ref 98–111)
Creatinine, Ser: 1.25 mg/dL — ABNORMAL HIGH (ref 0.61–1.24)
GFR, Estimated: >60 mL/min (ref >60)
Glucose, Bld: 415 mg/dL — ABNORMAL HIGH (ref 70–99)
Potassium: 4.4 mmol/L (ref 3.5–5.1)
Sodium: 138 mmol/L (ref 135–145)

## 2022-06-07 LAB — RESPIRATORY PANEL BY PCR

## 2022-06-07 LAB — GLUCOSE, CAPILLARY
Glucose-Capillary: 290 mg/dL — ABNORMAL HIGH (ref 70–99)
Glucose-Capillary: 311 mg/dL — ABNORMAL HIGH (ref 70–99)
Glucose-Capillary: 184 mg/dL — ABNORMAL HIGH (ref 70–99)
Glucose-Capillary: 231 mg/dL — ABNORMAL HIGH (ref 70–99)

## 2022-06-07 LAB — CBC
HCT: 40.2 % (ref 39.0–52.0)
Hemoglobin: 12.7 g/dL — ABNORMAL LOW (ref 13.0–17.0)
MCH: 28.3 pg (ref 26.0–34.0)
MCHC: 31.6 g/dL (ref 30.0–36.0)
MCV: 89.7 fL (ref 80.0–100.0)
Platelets: 230 10*3/uL (ref 150–400)
RBC: 4.48 MIL/uL (ref 4.22–5.81)
RDW: 15.2 % (ref 11.5–15.5)
WBC: 8 10*3/uL (ref 4.0–10.5)
nRBC: 0 % (ref 0.0–0.2)

## 2022-06-07 MED ORDER — LACTATED RINGERS IV SOLN: INTRAVENOUS | Status: DC

## 2022-06-07 MED ORDER — FLUTICASONE PROPIONATE 50 MCG/ACT NA SUSP
1.0000 | Freq: Every day | NASAL | Status: DC | PRN
  Filled 2022-06-07: qty 16

## 2022-06-07 MED ORDER — INSULIN ASPART 100 UNIT/ML IJ SOLN
0.0000 [IU] | Freq: Three times a day (TID) | INTRAMUSCULAR | Status: DC
  Administered 2022-06-07: 3 [IU] via SUBCUTANEOUS
  Administered 2022-06-08 (×2): 8 [IU] via SUBCUTANEOUS
  Administered 2022-06-08: 5 [IU] via SUBCUTANEOUS
  Administered 2022-06-09: 11 [IU] via SUBCUTANEOUS
  Administered 2022-06-09: 3 [IU] via SUBCUTANEOUS
  Administered 2022-06-09: 2 [IU] via SUBCUTANEOUS
  Administered 2022-06-10: 3 [IU] via SUBCUTANEOUS
  Administered 2022-06-10: 2 [IU] via SUBCUTANEOUS
  Administered 2022-06-10: 5 [IU] via SUBCUTANEOUS
  Administered 2022-06-11: 3 [IU] via SUBCUTANEOUS
  Administered 2022-06-11 (×2): 5 [IU] via SUBCUTANEOUS
  Administered 2022-06-12: 8 [IU] via SUBCUTANEOUS
  Filled 2022-06-07 (×10): qty 1

## 2022-06-07 MED ORDER — INSULIN GLARGINE-YFGN 100 UNIT/ML ~~LOC~~ SOLN
10.0000 [IU] | Freq: Every day | SUBCUTANEOUS | Status: DC
  Administered 2022-06-07: 10 [IU] via SUBCUTANEOUS
  Filled 2022-06-07: qty 0.1

## 2022-06-07 MED ORDER — INSULIN ASPART 100 UNIT/ML IJ SOLN
3.0000 [IU] | Freq: Three times a day (TID) | INTRAMUSCULAR | Status: DC
  Administered 2022-06-07 – 2022-06-12 (×14): 3 [IU] via SUBCUTANEOUS
  Filled 2022-06-07 (×11): qty 1

## 2022-06-07 MED ORDER — SALINE SPRAY 0.65 % NA SOLN
1.0000 | NASAL | Status: DC | PRN
  Administered 2022-06-07: 1 via NASAL
  Filled 2022-06-07: qty 44

## 2022-06-07 NOTE — Assessment & Plan Note (Signed)
Not on any medications at home. -Continue to monitor

## 2022-06-07 NOTE — Assessment & Plan Note (Signed)
Renal function around baseline. -Monitor renal function -Avoid nephrotoxins

## 2022-06-07 NOTE — ED Notes (Signed)
Pt was sleeping when his duo-neb was scheduled--gave it to him recently--see [facility]

## 2022-06-07 NOTE — Assessment & Plan Note (Signed)
Aspirin and Crestor 

## 2022-06-07 NOTE — Assessment & Plan Note (Signed)
-   CIWA protocol 

## 2022-06-07 NOTE — Assessment & Plan Note (Signed)
Patient is X/p of radiation therapy -Follow-up with oncology

## 2022-06-07 NOTE — Assessment & Plan Note (Signed)
-   Continue home Crestor °

## 2022-06-07 NOTE — Hospital Course (Addendum)
Taken from H&P.   Bradley [name] is a 72 y.o. male with medical history significant of hypertension, hyperlipidemia, diabetes mellitus, COPD, on 4-5L of oxygen at home, depression with anxiety, CKD-3A, PVD, former smoker, alcohol abuse, CAD, NSCLC (s/p of  XRT), who presents with shortness breath for 1 day, progressively worsening and associated with wheezing and cough with yellow-colored sputum production.  Patient was found to have severe respiratory distress.  Oxygen desaturation to 81% on 4 L oxygen, using accessory muscle for breathing, cannot speak in full sentence.  Initially patient was started on nonrebreather without significant improvement. BiPAP was started in ED.   Data reviewed independently and ED Course: pt was found to have WBC 13.7, lactic acid 3.4 --> 1.4, GFR> 60, temperature normal, blood pressure 125/66, heart rate 83, RR 26, chest x-ray showed hyperinflation and chronic lung disease without infiltration.  4/5: Patient was on BiPAP, COVID PCR was negative.  Preliminary blood cultures negative.  Procalcitonin negative.  Checking respiratory viral panel to look for a cause for his COPD exacerbation.  Leukocytosis resolved.  Blood glucose significantly elevated-most likely secondary to steroid use.  Mild worsening of creatinine at 1.25-not meeting the criteria for AKI.  4/6: Patient continued to improve, now back to his baseline oxygen requirement of 5 L at rest but do desaturate pretty quickly with minor exertion like moving around in bed.  Respiratory viral panel negative.  CBG elevated as patient is on steroid.  4/7: Patient continued to feel better.  Chest is clearing out and remained on 5 L of oxygen at rest.  We will try ambulating him again today.  Patient will need a concentrator which goes more than 5 L on discharge as he will most likely needing more oxygen.  Concern of COPD progression.  4/8: Vital stable and remain on 5 L at rest.  To desaturate up to 81% with minor  exertion, requiring more oxygen.  Need home concentrator which should go up to at least 10 L of oxygen.  Home health ordered.  Apparently daughter is trying to arrange hospice services.  Patient is now accepted for home hospice with [facility]-they are trying to arrange equipments, once done he can go home with hospice.  It will take 1 to 2 days

## 2022-06-07 NOTE — Assessment & Plan Note (Signed)
Patient currently on BiPAP, uses 4 to 5 L of oxygen at baseline. -Try weaning from BiPAP -Continue with supplemental oxygen to keep the saturation above 90%

## 2022-06-07 NOTE — Assessment & Plan Note (Signed)
A1c of 7.5.  Patient takes metformin and Tradjenta at home. CBG elevated-patient is on steroid -Moderate SSI -Add 3 units with meal -Add Semglee

## 2022-06-07 NOTE — Assessment & Plan Note (Signed)
Patient with productive cough, worsening shortness of breath and oxygen requirement is consistent with COPD exacerbation.  Chest x-ray and procalcitonin negative.  COVID-19 PCR negative Patient is currently on BiPAP.  Chest remained tight with decreased air entry. -Check respiratory viral panel -Continue with steroid -Wean off from BiPAP as tolerated -Continue with Zithromax -Continue with supportive care and bronchodilators

## 2022-06-07 NOTE — Assessment & Plan Note (Signed)
-  Continue home metoprolol -As needed hydralazine 

## 2022-06-07 NOTE — Inpatient Diabetes Management (Signed)
Inpatient Diabetes Program Recommendations  AACE/ADA: New Consensus Statement on Inpatient Glycemic Control (2015)  Target Ranges:  Prepandial:   less than 140 mg/dL      Peak postprandial:   less than 180 mg/dL (1-2 hours)      Critically ill patients:  140 - 180 mg/dL   Lab Results  Component Value Date   GLUCAP 290 (H) 06/07/2022   HGBA1C 7.5 (H) 04/02/2022    Review of Glycemic Control  Latest Reference Range & Units 06/06/22 23:20 06/07/22 08:23  Glucose-Capillary 70 - 99 mg/dL 564 (H) 332 (H)  (H): Data is abnormally high  Diabetes history: DM2 Outpatient Diabetes medications: Metformin 750 mg BID, Tradjenta 5 mg QD Current orders for Inpatient glycemic control: Novolog 0-9 units TID and 0-5 units QHS, Solumedrol 80 mg Q24H  Inpatient Diabetes Program Recommendations:    While on steroids, please consider:  Semglee 12 units QD Novolog 0-20 units TID and HS  Will continue to follow while inpatient.  Thank you, Dulce Sellar, MSN, CDCES Diabetes Coordinator Inpatient Diabetes Program [phone] (team pager from 8a-5p)

## 2022-06-07 NOTE — Progress Notes (Signed)
Progress Note   Patient: Bradley [name] SRP:[id number] DOB: March 31, 1950 DOA: 06/06/2022     1 DOS: the patient was seen and examined on 06/07/2022   Brief hospital course: Taken from H&P.   GOLDEN MULDERIG is a 72 y.o. male with medical history significant of hypertension, hyperlipidemia, diabetes mellitus, COPD, on 4-5L of oxygen at home, depression with anxiety, CKD-3A, PVD, former smoker, alcohol abuse, CAD, NSCLC (s/p of  XRT), who presents with shortness breath for 1 day, progressively worsening and associated with wheezing and cough with yellow-colored sputum production.  Patient was found to have severe respiratory distress.  Oxygen desaturation to 81% on 4 L oxygen, using accessory muscle for breathing, cannot speak in full sentence.  Initially patient was started on nonrebreather without significant improvement. BiPAP was started in ED.   Data reviewed independently and ED Course: pt was found to have WBC 13.7, lactic acid 3.4 --> 1.4, GFR> 60, temperature normal, blood pressure 125/66, heart rate 83, RR 26, chest x-ray showed hyperinflation and chronic lung disease without infiltration.  4/5: Patient was on BiPAP, COVID PCR was negative.  Preliminary blood cultures negative.  Procalcitonin negative.  Checking respiratory viral panel to look for a cause for his COPD exacerbation.  Leukocytosis resolved.  Blood glucose significantly elevated-most likely secondary to steroid use.  Mild worsening of creatinine at 1.25-not meeting the criteria for AKI.    Assessment and Plan: * COPD exacerbation Patient with productive cough, worsening shortness of breath and oxygen requirement is consistent with COPD exacerbation.  Chest x-ray and procalcitonin negative.  COVID-19 PCR negative Patient is currently on BiPAP.  Chest remained tight with decreased air entry. -Check respiratory viral panel -Continue with steroid -Wean off from BiPAP as tolerated -Continue with Zithromax -Continue with supportive  care and bronchodilators  Acute on chronic respiratory failure with hypoxia Patient currently on BiPAP, uses 4 to 5 L of oxygen at baseline. -Try weaning from BiPAP -Continue with supplemental oxygen to keep the saturation above 90%  Essential hypertension -Continue home metoprolol -As needed hydralazine  HLD (hyperlipidemia) -Continue home Crestor  Type II diabetes mellitus with renal manifestations A1c of 7.5.  Patient takes metformin and Tradjenta at home. CBG elevated-patient is on steroid -Moderate SSI -Add 3 units with meal -Add Semglee  Chronic kidney disease, stage 3a Renal function around baseline. -Monitor renal function -Avoid nephrotoxins  CAD (coronary artery disease) -Continue aspirin and Crestor  Non-small cell lung cancer (NSCLC) Patient is X/p of radiation therapy -Follow-up with oncology  CVD (cardiovascular disease) -Aspirin and Crestor  Alcohol abuse -CIWA protocol  Adjustment disorder with mixed anxiety and depressed mood Not on any medications at home. -Continue to monitor   Subjective: Patient continued to feel tight chest when seen today.  Remained on BiPAP  Physical Exam: Vitals:   06/07/22 0817 06/07/22 0955 06/07/22 1122 06/07/22 1149  BP: 116/64   116/64  Pulse: 86 93 80 84  Resp: (!) 24 (!) 23 (!) 23 (!) 21  Temp: (!) 97.4 F (36.3 C)   (!) 97.5 F (36.4 C)  TempSrc: Oral     SpO2:  99% 99%   Weight:       General.  Frail elderly man, in no acute distress. Pulmonary.  Decreased air entry bilaterally, normal respiratory effort. CV.  Regular rate and rhythm, no JVD, rub or murmur. Abdomen.  Soft, nontender, nondistended, BS positive. CNS.  Alert and oriented .  No focal neurologic deficit. Extremities.  No edema, no cyanosis,  pulses intact and symmetrical. Psychiatry.  Judgment and insight appears normal.   Data Reviewed: Prior data reviewed  Family Communication:   Disposition: Status is: Inpatient Remains inpatient  appropriate because: Severity of illness  Planned Discharge Destination: Home  DVT prophylaxis.  Lovenox Time spent: 50 minutes  This record has been created using Conservation officer, historic buildings. Errors have been sought and corrected,but may not always be located. Such creation errors do not reflect on the standard of care.   Author: Arnetha Courser, MD 06/07/2022 1:15 PM  For on call review [email or web address].

## 2022-06-07 NOTE — Assessment & Plan Note (Signed)
Continue aspirin and Crestor 

## 2022-06-07 NOTE — ED Notes (Signed)
Orders were in for this pt for ativan and CIWA score and apparently were in for another pt.  This pt does not have any ETOH history

## 2022-06-08 DIAGNOSIS — C3491 Malignant neoplasm of unspecified part of right bronchus or lung: Secondary | ICD-10-CM

## 2022-06-08 DIAGNOSIS — N1831 Chronic kidney disease, stage 3a: Secondary | ICD-10-CM

## 2022-06-08 DIAGNOSIS — J9621 Acute and chronic respiratory failure with hypoxia: Secondary | ICD-10-CM

## 2022-06-08 DIAGNOSIS — I10 Essential (primary) hypertension: Secondary | ICD-10-CM | POA: Diagnosis not present

## 2022-06-08 DIAGNOSIS — J441 Chronic obstructive pulmonary disease with (acute) exacerbation: Secondary | ICD-10-CM | POA: Diagnosis not present

## 2022-06-08 LAB — BASIC METABOLIC PANEL
Anion gap: 11 (ref 5–15)
BUN: 25 mg/dL — ABNORMAL HIGH (ref 8–23)
CO2: 21 mmol/L — ABNORMAL LOW (ref 22–32)
Calcium: 8.9 mg/dL (ref 8.9–10.3)
Chloride: 108 mmol/L (ref 98–111)
Creatinine, Ser: 1.1 mg/dL (ref 0.61–1.24)
GFR, Estimated: >60 mL/min (ref >60)
Glucose, Bld: 270 mg/dL — ABNORMAL HIGH (ref 70–99)
Potassium: 4.4 mmol/L (ref 3.5–5.1)
Sodium: 140 mmol/L (ref 135–145)

## 2022-06-08 LAB — GLUCOSE, CAPILLARY
Glucose-Capillary: 270 mg/dL — ABNORMAL HIGH (ref 70–99)
Glucose-Capillary: 281 mg/dL — ABNORMAL HIGH (ref 70–99)
Glucose-Capillary: 233 mg/dL — ABNORMAL HIGH (ref 70–99)
Glucose-Capillary: 152 mg/dL — ABNORMAL HIGH (ref 70–99)

## 2022-06-08 MED ORDER — PREDNISONE 50 MG PO TABS
50.0000 mg | ORAL_TABLET | Freq: Every day | ORAL | Status: DC
  Administered 2022-06-08 – 2022-06-12 (×5): 50 mg via ORAL
  Filled 2022-06-08 (×4): qty 1

## 2022-06-08 MED ORDER — THIAMINE MONONITRATE 100 MG PO TABS
100.0000 mg | ORAL_TABLET | Freq: Every day | ORAL | Status: DC
  Administered 2022-06-08 – 2022-06-12 (×5): 100 mg via ORAL
  Filled 2022-06-08 (×4): qty 1

## 2022-06-08 MED ORDER — INSULIN GLARGINE-YFGN 100 UNIT/ML ~~LOC~~ SOLN
10.0000 [IU] | Freq: Two times a day (BID) | SUBCUTANEOUS | Status: DC
  Administered 2022-06-08 – 2022-06-12 (×9): 10 [IU] via SUBCUTANEOUS
  Filled 2022-06-08 (×9): qty 0.1

## 2022-06-08 MED ORDER — FLUTICASONE PROPIONATE 50 MCG/ACT NA SUSP: 1.0000 | Freq: Every day | NASAL | Status: DC

## 2022-06-08 NOTE — Progress Notes (Addendum)
PT Cancellation Note  Patient Details Name: Bradley [name] MRN: [id number] DOB: 04/10/1950   Cancelled Treatment:    Reason Eval/Treat Not Completed: Medical issues which prohibited therapy OT worked with pt earlier this afternoon with significant fatigue and desaturations with just a modicum of activity.  He was needing increased O2 load and apparently was exhausted.  Does not appear able/appropriate to participate with PT this date.  Will maintain on caseload and attempt as appropriate.  Malachi Pro, DPT 06/08/2022, 4:02 PM

## 2022-06-08 NOTE — Progress Notes (Addendum)
Progress Note   Patient: Bradley [name] HYH:[id number] DOB: Jul 30, 1950 DOA: 06/06/2022     2 DOS: the patient was seen and examined on 06/08/2022   Brief hospital course: Taken from H&P.   Bradley [name] is a 72 y.o. male with medical history significant of hypertension, hyperlipidemia, diabetes mellitus, COPD, on 4-5L of oxygen at home, depression with anxiety, CKD-3A, PVD, former smoker, alcohol abuse, CAD, NSCLC (s/p of  XRT), who presents with shortness breath for 1 day, progressively worsening and associated with wheezing and cough with yellow-colored sputum production.  Patient was found to have severe respiratory distress.  Oxygen desaturation to 81% on 4 L oxygen, using accessory muscle for breathing, cannot speak in full sentence.  Initially patient was started on nonrebreather without significant improvement. BiPAP was started in ED.   Data reviewed independently and ED Course: pt was found to have WBC 13.7, lactic acid 3.4 --> 1.4, GFR> 60, temperature normal, blood pressure 125/66, heart rate 83, RR 26, chest x-ray showed hyperinflation and chronic lung disease without infiltration.  4/5: Patient was on BiPAP, COVID PCR was negative.  Preliminary blood cultures negative.  Procalcitonin negative.  Checking respiratory viral panel to look for a cause for his COPD exacerbation.  Leukocytosis resolved.  Blood glucose significantly elevated-most likely secondary to steroid use.  Mild worsening of creatinine at 1.25-not meeting the criteria for AKI.  4/6: Patient continued to improve, now back to his baseline oxygen requirement of 5 L at rest but do desaturate pretty quickly with minor exertion like moving around in bed.  Respiratory viral panel negative.  CBG elevated as patient is on steroid.   Assessment and Plan: * COPD exacerbation Patient with productive cough, worsening shortness of breath and oxygen requirement is consistent with COPD exacerbation.  Chest x-ray and procalcitonin negative.   COVID-19 PCR negative.  Respiratory viral panel negative Patient is back to baseline of oxygen requirement at rest but desaturating significantly with minor exertion. -Continue with steroid-switching Solu-Medrol with prednisone from tomorrow -Can use BiPAP as needed -Continue with Zithromax -Continue with supportive care and bronchodilators  Acute on chronic respiratory failure with hypoxia uses 4 to 5 L of oxygen at baseline.  Improved while resting but still desaturating significantly with minor exertion requiring higher level of oxygen -Continue with supplemental oxygen to keep the saturation above 90%  Essential hypertension -Continue home metoprolol -As needed hydralazine  HLD (hyperlipidemia) -Continue home Crestor  Type II diabetes mellitus with renal manifestations A1c of 7.5.  Patient takes metformin and Tradjenta at home. CBG elevated-patient is on steroid -Moderate SSI -Add 3 units with meal -Add Semglee  Chronic kidney disease, stage 3a Renal function around baseline. -Monitor renal function -Avoid nephrotoxins  CAD (coronary artery disease) -Continue aspirin and Crestor  Non-small cell lung cancer (NSCLC) Patient is X/p of radiation therapy -Follow-up with oncology  CVD (cardiovascular disease) -Aspirin and Crestor  Alcohol abuse -CIWA protocol  Adjustment disorder with mixed anxiety and depressed mood Not on any medications at home. -Continue to monitor   Subjective: Patient was feeling little improved at rest but becoming more short of breath with minor exertion like moving around in bed.  Physical Exam: Vitals:   06/08/22 0730 06/08/22 0813 06/08/22 1110 06/08/22 1122  BP:  121/63 137/76   Pulse:  85 80   Resp:  20 20   Temp:  97.7 F (36.5 C) 97.8 F (36.6 C)   TempSrc:  Oral Oral   SpO2: 92% 92% 92% 93%  Weight:       General.  Frail elderly man, in no acute distress. Pulmonary.  Lungs clear bilaterally, normal respiratory  effort. CV.  Regular rate and rhythm, no JVD, rub or murmur. Abdomen.  Soft, nontender, nondistended, BS positive. CNS.  Alert and oriented .  No focal neurologic deficit. Extremities.  No edema, no cyanosis, pulses intact and symmetrical. Psychiatry.  Judgment and insight appears normal.   Data Reviewed: Prior data reviewed  Family Communication: Talked with daughter on phone  Disposition: Status is: Inpatient Remains inpatient appropriate because: Severity of illness  Planned Discharge Destination: Home  DVT prophylaxis.  Lovenox Time spent: 45 minutes  This record has been created using Conservation officer, historic buildingsDragon voice recognition software. Errors have been sought and corrected,but may not always be located. Such creation errors do not reflect on the standard of care.   Author: Arnetha CourserSumayya Octavian Godek, MD 06/08/2022 2:08 PM  For on call review [email or web address].

## 2022-06-08 NOTE — Assessment & Plan Note (Signed)
uses 4 to 5 L of oxygen at baseline.  Improved while resting but still desaturating significantly with minor exertion requiring higher level of oxygen -Continue with supplemental oxygen to keep the saturation above 90% -Will need concentrator with higher capacity on discharge

## 2022-06-08 NOTE — Evaluation (Signed)
Occupational Therapy Evaluation Patient Details Name: Bradley [name] MRN: [id number] DOB: 08-22-1950 Today's Date: 06/08/2022   History of Present Illness Bradley [name] is a 72 y/o male with PMH of HTN, depression/anxiety, CKD3, PVD, CAD, COPD and lung cancer. Admitted with COPD exacerbation (one day worsening shortness of breath, cough, sputum).   Clinical Impression   Patient received for OT evaluation. See flowsheet below for details of function. Generally, patient MOD (I) for bed mobility, unable to perform mobility today 2/2 poor respiratory status (see flowsheet), and set up- MOD A for ADLs.   Patient will benefit from continued OT while in acute care.    Recommendations for follow up therapy are one component of a multi-disciplinary discharge planning process, led by the attending physician.  Recommendations may be updated based on patient status, additional functional criteria and insurance authorization.   Assistance Recommended at Discharge Intermittent Supervision/Assistance  Patient can return home with the following A little help with walking and/or transfers;A little help with bathing/dressing/bathroom    Functional Status Assessment  Patient has had a recent decline in their functional status and demonstrates the ability to make significant improvements in function in a reasonable and predictable amount of time.  Equipment Recommendations  None recommended by OT    Recommendations for Other Services       Precautions / Restrictions Precautions Precautions: Fall;Other (comment) (oxygen saturation) Restrictions Weight Bearing Restrictions: No      Mobility Bed Mobility Overal bed mobility: Independent                  Transfers                   General transfer comment: Unable to t/f to standing today due to poor respiratory status.      Balance                                           ADL either performed or assessed with  clinical judgement   ADL Overall ADL's : Needs assistance/impaired Eating/Feeding: Independent                   Lower Body Dressing: Moderate assistance Lower Body Dressing Details (indicate cue type and reason): MOD at for donning sandals today; pt states at home he is typically able to buckle sandals with great effort, but today is unable to lean forward to do so.               General ADL Comments: pt states that when he does ADLs at home, has to do seated rest breaks frequently due to poor respiratory status. Unable to attempt further ADLs today besides donning shoes at EOB 2/2 respiratory status.     Vision         Perception     Praxis      Pertinent Vitals/Pain Pain Assessment Pain Assessment: No/denies pain     Hand Dominance Right   Extremity/Trunk Assessment Upper Extremity Assessment Upper Extremity Assessment: Overall WFL for tasks assessed   Lower Extremity Assessment Lower Extremity Assessment: Overall WFL for tasks assessed (although did not stand or mobilize today; does bed mobility easily.)       Communication Communication Communication: No difficulties   Cognition Arousal/Alertness: Awake/alert Behavior During Therapy: WFL for tasks assessed/performed Overall Cognitive Status: Within Functional Limits for tasks assessed  General Comments: Very pleasant, motivated; significantly limited by respiratory status. Seems to understand oxygen numbers well.     General Comments  pt on 5L O2 when OT arrived, saturation 93-95%. Once talking with OT and telling his history information, sats dropped to 88-91%. Pt c/o feeling like his nose is stuffy today and unsure if O2 is getting in as well as it should. With MD clearance, pt able to sit EOB to see how O2 sats do. On 5L, pt sat EOB without difficulty and donned shoes with OT assistance. Within 1 minute, sats dropped to 79% and did not improve with pt  pursed lip breathing seated at EOB. OT bumped O2 up to 6L; after 1 minute, sat only improved to 80% with good waveform. on 7L, 82% after 1 minute and 84% after 3 minutes; not improving further. O2 on 8L improved to 86-87%; 88% after 3 minutes. Pt able to tolerate approx 10 minutes of sitting EOB, but sats not improving beyond 88% on 8L. Further mobility deferred and MD, PT, and RN notified of pt respiratory status. Pt able to t/f self back into bed. In bed, O2 on 8L pt recovered to 96%. OT moved O2 back to 6L and pt at 93% after several minutes; stable when OT exited the room; MD aware.    Exercises     Shoulder Instructions      Home Living Family/patient expects to be discharged to:: Private residence Living Arrangements: Alone Available Help at Discharge: Family (grandson lives next door) Type of Home: Other(Comment) (camper) Home Access: Stairs to enter Secretary/administratorntrance Stairs-Number of Steps: 2-3 Entrance Stairs-Rails: Left Home Layout: One level     Bathroom Shower/Tub: Producer, television/film/videoWalk-in shower   Bathroom Toilet: Standard     Home Equipment: Wheelchair - manual;Cane - single point (pulse oximeter)   Additional Comments: Pt states he uses oxygen at home 4.5-5L; has oxygen concentrator, and sometime portable oxygen does not go high enough for him to get to safe oxygen saturation levels (88%+).      Prior Functioning/Environment Prior Level of Function : Needs assist;Driving       Physical Assist : Mobility (physical);ADLs (physical)   ADLs (physical): IADLs (grandson assists with carrying heavy things and groceries into the house; pt states he has been getting free food pickup from a nearby church recently. has to use the power cart if going into a store, which he asn't been able to do in a while) Mobility Comments: Cannot walk all the way across his home without stopping for rest breaks due to poor respiratory status. Does check O2 at home and sometimes it gets into low 80's or even  70's. ADLs Comments: MOD (I) for ADLs, although takes a very long time 2/2 poor respiratory status; has to go very slowly and take lots of rest breaks.        OT Problem List: Decreased activity tolerance;Cardiopulmonary status limiting activity      OT Treatment/Interventions: Self-care/ADL training;Therapeutic exercise;Patient/family education;Therapeutic activities;Energy conservation;DME and/or AE instruction    OT Goals(Current goals can be found in the care plan section) Acute Rehab OT Goals Patient Stated Goal: Breathe better OT Goal Formulation: With patient Time For Goal Achievement: 06/22/22 Potential to Achieve Goals: Good ADL Goals Pt Will Perform Lower Body Bathing: with modified independence;sit to/from stand Pt Will Perform Lower Body Dressing: with modified independence;sit to/from stand Pt Will Transfer to Toilet: with modified independence;ambulating Pt Will Perform Toileting - Clothing Manipulation and hygiene: with modified independence;sit to/from stand  OT Frequency: Min 2X/week    Co-evaluation              AM-PAC OT "6 Clicks" Daily Activity     Outcome Measure Help from another person eating meals?: None Help from another person taking care of personal grooming?: A Little Help from another person toileting, which includes using toliet, bedpan, or urinal?: A Little Help from another person bathing (including washing, rinsing, drying)?: A Little Help from another person to put on and taking off regular upper body clothing?: A Little Help from another person to put on and taking off regular lower body clothing?: A Little 6 Click Score: 19   End of Session Nurse Communication: Mobility status;Other (comment) (oxygen/respiratory status)  Activity Tolerance: Treatment limited secondary to medical complications (Comment) (limited by respiratory status) Patient left: in bed;with call bell/phone within reach  OT Visit Diagnosis: Other (comment) (poor  respiratory status)                Time: 1240-1315 OT Time Calculation (min): 35 min Charges:  OT General Charges $OT Visit: 1 Visit OT Evaluation $OT Eval Moderate Complexity: 1 Mod OT Treatments $Therapeutic Activity: 8-22 mins  Linward Foster, MS, OTR/L  Alvester Morin 06/08/2022, 2:41 PM

## 2022-06-08 NOTE — Consult Note (Signed)
PHARMACIST - PHYSICIAN COMMUNICATION  DR: Arnetha Courser, MD  CONCERNING: IV to Oral Route Change Policy  RECOMMENDATION: This patient is receiving thiamine by the intravenous route.  Based on criteria approved by the Pharmacy and Therapeutics Committee, the intravenous medication(s) is/are being converted to the equivalent oral dose form(s).   DESCRIPTION: These criteria include: The patient is eating (either orally or via tube) and/or has been taking other orally administered medications for a least 24 hours The patient has no evidence of active gastrointestinal bleeding or impaired GI absorption (gastrectomy, short bowel, patient on TNA or NPO).  If you have questions about this conversion, please contact the Pharmacy Department  []   [phone] )  [facility] [x]   [phone] )  [facility] []   [phone] )  [facility] []   [phone] )  [facility] []   [phone] )  [facility]   Celene Squibb, PharmD PGY1 Pharmacy Resident 06/08/2022 1:40 PM

## 2022-06-08 NOTE — Assessment & Plan Note (Addendum)
Patient with productive cough, worsening shortness of breath and oxygen requirement is consistent with COPD exacerbation.  Chest x-ray and procalcitonin negative.  COVID-19 PCR negative.  Respiratory viral panel negative Patient is back to baseline of oxygen requirement at rest but desaturating significantly with minor exertion. -Continue with steroid-switching Solu-Medrol with prednisone from tomorrow -Can use BiPAP as needed -Continue with Zithromax -Continue with supportive care and bronchodilators

## 2022-06-09 DIAGNOSIS — J441 Chronic obstructive pulmonary disease with (acute) exacerbation: Secondary | ICD-10-CM | POA: Diagnosis not present

## 2022-06-09 DIAGNOSIS — N1831 Chronic kidney disease, stage 3a: Secondary | ICD-10-CM | POA: Diagnosis not present

## 2022-06-09 DIAGNOSIS — J9621 Acute and chronic respiratory failure with hypoxia: Secondary | ICD-10-CM | POA: Diagnosis not present

## 2022-06-09 DIAGNOSIS — I10 Essential (primary) hypertension: Secondary | ICD-10-CM | POA: Diagnosis not present

## 2022-06-09 LAB — GLUCOSE, CAPILLARY
Glucose-Capillary: 133 mg/dL — ABNORMAL HIGH (ref 70–99)
Glucose-Capillary: 169 mg/dL — ABNORMAL HIGH (ref 70–99)
Glucose-Capillary: 312 mg/dL — ABNORMAL HIGH (ref 70–99)
Glucose-Capillary: 269 mg/dL — ABNORMAL HIGH (ref 70–99)

## 2022-06-09 MED ORDER — UMECLIDINIUM BROMIDE 62.5 MCG/ACT IN AEPB
1.0000 | INHALATION_SPRAY | Freq: Every day | RESPIRATORY_TRACT | Status: DC
  Administered 2022-06-09 – 2022-06-12 (×4): 1 via RESPIRATORY_TRACT
  Filled 2022-06-09: qty 7

## 2022-06-09 MED ORDER — FLUTICASONE FUROATE-VILANTEROL 100-25 MCG/ACT IN AEPB
1.0000 | INHALATION_SPRAY | Freq: Every day | RESPIRATORY_TRACT | Status: DC
  Administered 2022-06-09 – 2022-06-12 (×4): 1 via RESPIRATORY_TRACT
  Filled 2022-06-09: qty 28

## 2022-06-09 NOTE — Assessment & Plan Note (Signed)
A1c of 7.5.  Patient takes metformin and Tradjenta at home. CBG within goal after making changes yesterday -Moderate SSI -Add 3 units with meal -Add Semglee

## 2022-06-09 NOTE — Progress Notes (Signed)
Progress Note   Patient: Bradley [name] [id number] DOB: 09/17/1950 DOA: 06/06/2022     3 DOS: the patient was seen and examined on 06/09/2022   Brief hospital course: Taken from H&P.   Bradley [name] is a 72 y.o. male with medical history significant of hypertension, hyperlipidemia, diabetes mellitus, COPD, on 4-5L of oxygen at home, depression with anxiety, CKD-3A, PVD, former smoker, alcohol abuse, CAD, NSCLC (s/p of  XRT), who presents with shortness breath for 1 day, progressively worsening and associated with wheezing and cough with yellow-colored sputum production.  Patient was found to have severe respiratory distress.  Oxygen desaturation to 81% on 4 L oxygen, using accessory muscle for breathing, cannot speak in full sentence.  Initially patient was started on nonrebreather without significant improvement. BiPAP was started in ED.   Data reviewed independently and ED Course: pt was found to have WBC 13.7, lactic acid 3.4 --> 1.4, GFR> 60, temperature normal, blood pressure 125/66, heart rate 83, RR 26, chest x-ray showed hyperinflation and chronic lung disease without infiltration.  4/5: Patient was on BiPAP, COVID PCR was negative.  Preliminary blood cultures negative.  Procalcitonin negative.  Checking respiratory viral panel to look for a cause for his COPD exacerbation.  Leukocytosis resolved.  Blood glucose significantly elevated-most likely secondary to steroid use.  Mild worsening of creatinine at 1.25-not meeting the criteria for AKI.  4/6: Patient continued to improve, now back to his baseline oxygen requirement of 5 L at rest but do desaturate pretty quickly with minor exertion like moving around in bed.  Respiratory viral panel negative.  CBG elevated as patient is on steroid.  4/7: Patient continued to feel better.  Chest is clearing out and remained on 5 L of oxygen at rest.  We will try ambulating him again today.  Patient will need a concentrator which goes more than 5 L on  discharge as he will most likely needing more oxygen.  Concern of COPD progression.   Assessment and Plan: * COPD exacerbation Patient with productive cough, worsening shortness of breath and oxygen requirement is consistent with COPD exacerbation.  Chest x-ray and procalcitonin negative.  COVID-19 PCR negative.  Respiratory viral panel negative Patient is back to baseline of oxygen requirement at rest but desaturating significantly with minor exertion. -Continue with steroid-switching Solu-Medrol with prednisone from tomorrow -Can use BiPAP as needed -Continue with Zithromax -Continue with supportive care and bronchodilators  Acute on chronic respiratory failure with hypoxia uses 4 to 5 L of oxygen at baseline.  Improved while resting but still desaturating significantly with minor exertion requiring higher level of oxygen -Continue with supplemental oxygen to keep the saturation above 90%  Essential hypertension -Continue home metoprolol -As needed hydralazine  HLD (hyperlipidemia) -Continue home Crestor  Type II diabetes mellitus with renal manifestations A1c of 7.5.  Patient takes metformin and Tradjenta at home. CBG within goal after making changes yesterday -Moderate SSI -Add 3 units with meal -Add Semglee  Chronic kidney disease, stage 3a Renal function around baseline. -Monitor renal function -Avoid nephrotoxins  CAD (coronary artery disease) -Continue aspirin and Crestor  Non-small cell lung cancer (NSCLC) Patient is X/p of radiation therapy -Follow-up with oncology  CVD (cardiovascular disease) -Aspirin and Crestor  Alcohol abuse -CIWA protocol  Adjustment disorder with mixed anxiety and depressed mood Not on any medications at home. -Continue to monitor   Subjective: Patient continued to improve and stays stable at rest.  Willing to try ambulation again.  Physical Exam: Vitals:  06/09/22 0740 06/09/22 0817 06/09/22 1143 06/09/22 1145  BP:  120/73   111/83  Pulse:  69  67  Resp:  (!) 23  16  Temp:  97.6 F (36.4 C)  (!) 97.3 F (36.3 C)  TempSrc:  Oral  Oral  SpO2: 93% 95% 94% 98%  Weight:       General.  Frail elderly man, in no acute distress. Pulmonary.  Lungs clear bilaterally, normal respiratory effort. CV.  Regular rate and rhythm, no JVD, rub or murmur. Abdomen.  Soft, nontender, nondistended, BS positive. CNS.  Alert and oriented .  No focal neurologic deficit. Extremities.  No edema, no cyanosis, pulses intact and symmetrical. Psychiatry.  Judgment and insight appears normal.   Data Reviewed: Prior data reviewed  Family Communication: Talked with daughter on phone  Disposition: Status is: Inpatient Remains inpatient appropriate because: Severity of illness  Planned Discharge Destination: Home  DVT prophylaxis.  Lovenox Time spent: 42 minutes  This record has been created using Conservation officer, historic buildings. Errors have been sought and corrected,but may not always be located. Such creation errors do not reflect on the standard of care.   Author: Arnetha Courser, MD 06/09/2022 1:20 PM  For on call review [email or web address].

## 2022-06-09 NOTE — Evaluation (Signed)
Physical Therapy Evaluation Patient Details Name: Zella RicherLarry W Granberg MRN: [id number] DOB: 11/02/1950 Today's Date: 06/09/2022  History of Present Illness  Diana EvesLarry Glore is a 72 y/o male with PMH of HTN, depression/anxiety, CKD3, PVD, CAD, COPD and lung cancer. Admitted with COPD exacerbation (one day worsening shortness of breath, cough, sputum).  Clinical Impression  Pt very pleasant and eager to work with PT despite acknowledging that he is too quick to fatigue to do much out of bed activity.  He was able to move relatively well and did not need physical assist but each movement or even saying more than a few words at a time lead to significant DOE and drop in O2 - needing multiple minutes to recover a few SpO2 points.  Pt tolerated ~1 minute of standing EOB on 5L but SpO2 progressively dropped from low 90s to low 80s and on sitting continued to drop as low as 79% staying in the 80-85% range for many minutes despite focused breathing, did not breach 88% for >5 minutes of seated rest.   SaO2 on 5L at rest = 92% SaO2 on 5L while marching EOB 1 minute = 81% Unable to safely trial SpO2 on room air due to desaturations     Recommendations for follow up therapy are one component of a multi-disciplinary discharge planning process, led by the attending physician.  Recommendations may be updated based on patient status, additional functional criteria and insurance authorization.  Follow Up Recommendations       Assistance Recommended at Discharge Intermittent Supervision/Assistance  Patient can return home with the following  A little help with walking and/or transfers;Assistance with cooking/housework;Assist for transportation;Help with stairs or ramp for entrance    Equipment Recommendations Rolling walker (2 wheels)  Recommendations for Other Services       Functional Status Assessment Patient has had a recent decline in their functional status and demonstrates the ability to make significant  improvements in function in a reasonable and predictable amount of time.     Precautions / Restrictions Precautions Precautions: Fall;Other (comment) (O2) Restrictions Weight Bearing Restrictions: No      Mobility  Bed Mobility Overal bed mobility: Independent                  Transfers Overall transfer level: Modified independent Equipment used: Rolling walker (2 wheels)               General transfer comment: Pt was able to rise to standing w/o direct assist, light UE use on the walker.    Ambulation/Gait               General Gait Details: Standing EOB ~1 minutes with a few small side steps/marching in place (~6 steps each with each LE).  SpO2 drops from low 90s to 81%, pt with heavy DOE, needs to sit with SpO2 dropping further to 79% in seated and very slow (>5 minutes) for O2 to rise >88% - did maintain on 5L t/o the effort  Stairs            Wheelchair Mobility    Modified Rankin (Stroke Patients Only)       Balance Overall balance assessment: Modified Independent (used walker more energy conservation but did was not at all overly reliant on it - good stability despite fatigue)  Pertinent Vitals/Pain Pain Assessment Pain Assessment: No/denies pain    Home Living Family/patient expects to be discharged to:: Private residence Living Arrangements: Alone Available Help at Discharge: Family (grandson next door) Type of Home:  (camper) Home Access: Stairs to enter Entrance Stairs-Rails: Left Entrance Stairs-Number of Steps: 2-3   Home Layout: One level Home Equipment: Wheelchair - manual;Cane - single point Additional Comments: Pt states he uses oxygen at home 4.5-5L; has oxygen concentrator, and sometime portable oxygen does not go high enough for him to get to safe oxygen saturation levels (88%+).    Prior Function Prior Level of Function : Needs assist;Driving            ADLs (physical):  (son helps with even moderately heavy tasks, reports he gets excessively limited with even brief bouts of ambulation) Mobility Comments: Cannot walk all the way across his home without stopping for rest breaks due to poor respiratory status. Does check O2 at home and sometimes it gets into low 80's or even 70's.       Hand Dominance        Extremity/Trunk Assessment   Upper Extremity Assessment Upper Extremity Assessment: Generalized weakness    Lower Extremity Assessment Lower Extremity Assessment: Generalized weakness       Communication   Communication: No difficulties (SpO2 drops with sustained speaking)  Cognition Arousal/Alertness: Awake/alert Behavior During Therapy: WFL for tasks assessed/performed Overall Cognitive Status: Within Functional Limits for tasks assessed                                          General Comments General comments (skin integrity, edema, etc.): Pt very pleasant and motivated and moves well, just dealing with significant DOE with very poor activity tolerance    Exercises     Assessment/Plan    PT Assessment Patient needs continued PT services  PT Problem List Decreased activity tolerance;Cardiopulmonary status limiting activity       PT Treatment Interventions      PT Goals (Current goals can be found in the Care Plan section)  Acute Rehab PT Goals Patient Stated Goal: go home PT Goal Formulation: With patient Time For Goal Achievement: 06/22/22 Potential to Achieve Goals: Fair    Frequency Min 2X/week     Co-evaluation               AM-PAC PT "6 Clicks" Mobility  Outcome Measure Help needed turning from your back to your side while in a flat bed without using bedrails?: None Help needed moving from lying on your back to sitting on the side of a flat bed without using bedrails?: None Help needed moving to and from a bed to a chair (including a wheelchair)?: None Help needed  standing up from a chair using your arms (e.g., wheelchair or bedside chair)?: None Help needed to walk in hospital room?: A Lot Help needed climbing 3-5 steps with a railing? : A Lot 6 Click Score: 20    End of Session Equipment Utilized During Treatment: Gait belt;Oxygen (5L) Activity Tolerance: Patient limited by fatigue Patient left: with bed alarm set;with call bell/phone within reach Nurse Communication: Mobility status (SpO2 drop to 70s with ~1 minute of standing activity) PT Visit Diagnosis: Adult, failure to thrive (R62.7);Difficulty in walking, not elsewhere classified (R26.2)    Time: 1610-9604 PT Time Calculation (min) (ACUTE ONLY): 20 min   Charges:  PT Evaluation $PT Eval Low Complexity: 1 Low PT Treatments $Therapeutic Activity: 8-22 mins        Malachi Pro, DPT 06/09/2022, 6:12 PM

## 2022-06-09 NOTE — TOC Progression Note (Signed)
Transition of Care [facility]) - Progression Note    Patient Details  Name: Bradley [name] MRN: [id number] Date of Birth: 1950/04/15  Transition of Care [facility]) CM/SW Contact  Bing Quarry, RN Phone Number: 06/09/2022, 12:34 PM  Clinical Narrative:  4/7: Per provider, patient will need upgrade to larger condenser for going home on 6L/[place]. Both patient and daughter say [employer] but they have no record per [facility] and she will send it up to supervisor Monday am. Updated provider on status. Gabriel Cirri RN CM          Expected Discharge Plan and Services                                               Social Determinants of Health (SDOH) Interventions SDOH Screenings   Food Insecurity: No Food Insecurity (06/07/2022)  Housing: Low Risk  (06/07/2022)  Transportation Needs: No Transportation Needs (06/07/2022)  Utilities: Not At Risk (06/07/2022)  Depression (PHQ2-9): Low Risk  (07/13/2018)  Tobacco Use: Medium Risk (06/06/2022)    Readmission Risk Interventions    04/04/2022    3:56 PM 02/07/2021    3:13 PM 07/31/2020   10:04 AM  Readmission Risk Prevention Plan  Transportation Screening Complete Complete Complete  PCP or Specialist Appt within 3-5 Days Complete Complete Complete  HRI or Home Care Consult Complete Complete Complete  Social Work Consult for Recovery Care Planning/Counseling Complete Complete Not Complete  SW consult not completed comments   RNCM assigned to patient  Palliative Care Screening Not Applicable Not Applicable Not Applicable  Medication Review (RN Care Manager) Complete Complete Complete

## 2022-06-10 DIAGNOSIS — N1831 Chronic kidney disease, stage 3a: Secondary | ICD-10-CM | POA: Diagnosis not present

## 2022-06-10 DIAGNOSIS — J441 Chronic obstructive pulmonary disease with (acute) exacerbation: Secondary | ICD-10-CM | POA: Diagnosis not present

## 2022-06-10 DIAGNOSIS — J9621 Acute and chronic respiratory failure with hypoxia: Secondary | ICD-10-CM | POA: Diagnosis not present

## 2022-06-10 DIAGNOSIS — I10 Essential (primary) hypertension: Secondary | ICD-10-CM | POA: Diagnosis not present

## 2022-06-10 LAB — GLUCOSE, CAPILLARY
Glucose-Capillary: 175 mg/dL — ABNORMAL HIGH (ref 70–99)
Glucose-Capillary: 143 mg/dL — ABNORMAL HIGH (ref 70–99)
Glucose-Capillary: 244 mg/dL — ABNORMAL HIGH (ref 70–99)
Glucose-Capillary: 321 mg/dL — ABNORMAL HIGH (ref 70–99)

## 2022-06-10 MED ORDER — IPRATROPIUM-ALBUTEROL 0.5-2.5 (3) MG/3ML IN SOLN
3.0000 mL | Freq: Four times a day (QID) | RESPIRATORY_TRACT | Status: DC
  Administered 2022-06-10 – 2022-06-11 (×4): 3 mL via RESPIRATORY_TRACT
  Filled 2022-06-10 (×4): qty 3

## 2022-06-10 NOTE — Progress Notes (Signed)
Progress Note   Patient: Bradley [name] KSH:[id number] DOB: 15-Apr-1950 DOA: 06/06/2022     4 DOS: the patient was seen and examined on 06/10/2022   Brief hospital course: Taken from H&P.   Bradley [name] is a 72 y.o. male with medical history significant of hypertension, hyperlipidemia, diabetes mellitus, COPD, on 4-5L of oxygen at home, depression with anxiety, CKD-3A, PVD, former smoker, alcohol abuse, CAD, NSCLC (s/p of  XRT), who presents with shortness breath for 1 day, progressively worsening and associated with wheezing and cough with yellow-colored sputum production.  Patient was found to have severe respiratory distress.  Oxygen desaturation to 81% on 4 L oxygen, using accessory muscle for breathing, cannot speak in full sentence.  Initially patient was started on nonrebreather without significant improvement. BiPAP was started in ED.   Data reviewed independently and ED Course: pt was found to have WBC 13.7, lactic acid 3.4 --> 1.4, GFR> 60, temperature normal, blood pressure 125/66, heart rate 83, RR 26, chest x-ray showed hyperinflation and chronic lung disease without infiltration.  4/5: Patient was on BiPAP, COVID PCR was negative.  Preliminary blood cultures negative.  Procalcitonin negative.  Checking respiratory viral panel to look for a cause for his COPD exacerbation.  Leukocytosis resolved.  Blood glucose significantly elevated-most likely secondary to steroid use.  Mild worsening of creatinine at 1.25-not meeting the criteria for AKI.  4/6: Patient continued to improve, now back to his baseline oxygen requirement of 5 L at rest but do desaturate pretty quickly with minor exertion like moving around in bed.  Respiratory viral panel negative.  CBG elevated as patient is on steroid.  4/7: Patient continued to feel better.  Chest is clearing out and remained on 5 L of oxygen at rest.  We will try ambulating him again today.  Patient will need a concentrator which goes more than 5 L on  discharge as he will most likely needing more oxygen.  Concern of COPD progression.  4/8: Vital stable and remain on 5 L at rest.  To desaturate up to 81% with minor exertion, requiring more oxygen.  Need home concentrator which should go up to at least 10 L of oxygen.  Home health ordered.  Apparently daughter is trying to arrange hospice services.  Patient is now accepted for home hospice with [facility]-they are trying to arrange equipments, once done he can go home with hospice.  It will take 1 to 2 days   Assessment and Plan: * COPD exacerbation Patient with productive cough, worsening shortness of breath and oxygen requirement is consistent with COPD exacerbation.  Chest x-ray and procalcitonin negative.  COVID-19 PCR negative.  Respiratory viral panel negative Patient is back to baseline of oxygen requirement at rest but desaturating significantly with minor exertion. -Continue with steroid-switching Solu-Medrol with prednisone from tomorrow -Can use BiPAP as needed -Continue with Zithromax -Continue with supportive care and bronchodilators  Acute on chronic respiratory failure with hypoxia uses 4 to 5 L of oxygen at baseline.  Improved while resting but still desaturating significantly with minor exertion requiring higher level of oxygen -Continue with supplemental oxygen to keep the saturation above 90% -Will need concentrator with higher capacity on discharge  Essential hypertension -Continue home metoprolol -As needed hydralazine  HLD (hyperlipidemia) -Continue home Crestor  Type II diabetes mellitus with renal manifestations A1c of 7.5.  Patient takes metformin and Tradjenta at home. CBG within goal after making changes yesterday -Moderate SSI -Add 3 units with meal -Add Semglee  Chronic kidney disease, stage 3a Renal function around baseline. -Monitor renal function -Avoid nephrotoxins  CAD (coronary artery disease) -Continue aspirin and Crestor  Non-small cell  lung cancer (NSCLC) Patient is X/p of radiation therapy -Follow-up with oncology  CVD (cardiovascular disease) -Aspirin and Crestor  Alcohol abuse -CIWA protocol  Adjustment disorder with mixed anxiety and depressed mood Not on any medications at home. -Continue to monitor   Subjective: Patient was sitting in chair when seen today.  Stating that just transferring from bed to chair really wears him off.  Per patient his daughter is bringing someone from hospice later today for his evaluation.  Physical Exam: Vitals:   06/10/22 0330 06/10/22 0744 06/10/22 0745 06/10/22 1153  BP: (!) 107/94  121/63 103/72  Pulse: 66  77 69  Resp: 20  20 20   Temp: 97.7 F (36.5 C)  (!) 96.7 F (35.9 C) (!) 96.6 F (35.9 C)  TempSrc: Axillary  Axillary Axillary  SpO2: 98% 95% 92% 90%  Weight:       General.  Frail elderly man, in no acute distress. Pulmonary.  Lungs clear bilaterally, normal respiratory effort. CV.  Regular rate and rhythm, no JVD, rub or murmur. Abdomen.  Soft, nontender, nondistended, BS positive. CNS.  Alert and oriented .  No focal neurologic deficit. Extremities.  No edema, no cyanosis, pulses intact and symmetrical. Psychiatry.  Judgment and insight appears normal.   Data Reviewed: Prior data reviewed  Family Communication: Talked with daughter on phone  Disposition: Status is: Inpatient Remains inpatient appropriate because: Severity of illness  Planned Discharge Destination: Home with hospice  DVT prophylaxis.  Lovenox Time spent: 44 minutes  This record has been created using Conservation officer, historic buildings. Errors have been sought and corrected,but may not always be located. Such creation errors do not reflect on the standard of care.   Author: Arnetha Courser, MD 06/10/2022 1:55 PM  For on call review [email or web address].

## 2022-06-11 DIAGNOSIS — I10 Essential (primary) hypertension: Secondary | ICD-10-CM | POA: Diagnosis not present

## 2022-06-11 DIAGNOSIS — N1831 Chronic kidney disease, stage 3a: Secondary | ICD-10-CM | POA: Diagnosis not present

## 2022-06-11 DIAGNOSIS — J441 Chronic obstructive pulmonary disease with (acute) exacerbation: Secondary | ICD-10-CM | POA: Diagnosis not present

## 2022-06-11 DIAGNOSIS — J9621 Acute and chronic respiratory failure with hypoxia: Secondary | ICD-10-CM | POA: Diagnosis not present

## 2022-06-11 LAB — GLUCOSE, CAPILLARY
Glucose-Capillary: 165 mg/dL — ABNORMAL HIGH (ref 70–99)
Glucose-Capillary: 223 mg/dL — ABNORMAL HIGH (ref 70–99)
Glucose-Capillary: 212 mg/dL — ABNORMAL HIGH (ref 70–99)
Glucose-Capillary: 299 mg/dL — ABNORMAL HIGH (ref 70–99)

## 2022-06-11 LAB — CULTURE, BLOOD (ROUTINE X 2)
Culture: NO GROWTH
Culture: NO GROWTH
Special Requests: ADEQUATE

## 2022-06-11 MED ORDER — ADULT MULTIVITAMIN W/MINERALS CH: 1.0000 | ORAL_TABLET | Freq: Every day | ORAL | 0 refills | Status: DC

## 2022-06-11 MED ORDER — FOLIC ACID 1 MG PO TABS: 1.0000 mg | ORAL_TABLET | Freq: Every day | ORAL | 0 refills | Status: DC

## 2022-06-11 MED ORDER — VITAMIN B-1 100 MG PO TABS: 100.0000 mg | ORAL_TABLET | Freq: Every day | ORAL | 0 refills | Status: DC

## 2022-06-11 MED ORDER — FLUTICASONE PROPIONATE 50 MCG/ACT NA SUSP: 1.0000 | Freq: Every day | NASAL | 2 refills | Status: DC | PRN

## 2022-06-11 MED ORDER — PREDNISONE 10 MG (21) PO TBPK: ORAL_TABLET | ORAL | 0 refills | Status: DC

## 2022-06-11 MED ORDER — IPRATROPIUM-ALBUTEROL 0.5-2.5 (3) MG/3ML IN SOLN
3.0000 mL | Freq: Three times a day (TID) | RESPIRATORY_TRACT | Status: DC
  Administered 2022-06-11 – 2022-06-12 (×3): 3 mL via RESPIRATORY_TRACT
  Filled 2022-06-11 (×3): qty 3

## 2022-06-11 NOTE — Care Management Important Message (Signed)
Important Message  Patient Details  Name: Bradley [name] MRN: [id number] Date of Birth: [birth year]   Medicare Important Message Given:  Other (see comment)  Disposition to discharge with hospice services.  Medicare IM withheld at this time out of respect for the patient and family.   Johnell Comings 06/11/2022, 2:43 PM

## 2022-06-11 NOTE — TOC Progression Note (Signed)
Transition of Care [facility]) - Progression Note    Patient Details  Name: Bradley [name] MRN: [id number] Date of Birth: [birth year]  [facility]) CM/SW Contact  Truddie Hidden, RN Phone Number: 06/11/2022, 2:33 PM  Clinical Narrative:    Spoke with patient's daughter, [name] and Marco Collie from [facility] regarding discharge. Patient daughter has agreeable to hospice services via [place]. Melissa stated shewould would be able to order and have all needed DME Wednesday. Patient daughter in agreement.        Expected Discharge Plan and Services         Expected Discharge Date: 06/11/22                                     Social Determinants of Health (SDOH) Interventions SDOH Screenings   Food Insecurity: No Food Insecurity (06/07/2022)  Housing: Low Risk  (06/07/2022)  Transportation Needs: No Transportation Needs (06/07/2022)  Utilities: Not At Risk (06/07/2022)  Depression (PHQ2-9): Low Risk  (07/13/2018)  Tobacco Use: Medium Risk (06/06/2022)    Readmission Risk Interventions    04/04/2022    3:56 PM 02/07/2021    3:13 PM 07/31/2020   10:04 AM  Readmission Risk Prevention Plan  Transportation Screening Complete Complete Complete  PCP or Specialist Appt within 3-5 Days Complete Complete Complete  HRI or Home Care Consult Complete Complete Complete  Social Work Consult for Recovery Care Planning/Counseling Complete Complete Not Complete  SW consult not completed comments   RNCM assigned to patient  Palliative Care Screening Not Applicable Not Applicable Not Applicable  Medication Review (RN Care Manager) Complete Complete Complete

## 2022-06-11 NOTE — TOC Progression Note (Signed)
Transition of Care [facility]) - Progression Note    Patient Details  Name: OSCAR CARPENITO MRN: [id number] Date of Birth: [birth year]  Transition of Care [facility]) CM/SW Contact  Truddie Hidden, RN Phone Number: 06/11/2022, 2:46 PM  Clinical Narrative:    Attempt to reach patient's daughter, [name]. No answer. Left a message.  Spoke with Marco Collie from [place] regarding DME for RW and oxygen. BIPAP settings neededf. MD notified.  2:30pm Melissa requesting BIPAP settings. She was advised BIPAP settings pending MD signature.  Melissa advised all DME has been requested as STAT except BIPAP. Approximated TAT is at least four hours.       Expected Discharge Plan and Services         Expected Discharge Date: 06/11/22                                     Social Determinants of Health (SDOH) Interventions SDOH Screenings   Food Insecurity: No Food Insecurity (06/07/2022)  Housing: Low Risk  (06/07/2022)  Transportation Needs: No Transportation Needs (06/07/2022)  Utilities: Not At Risk (06/07/2022)  Depression (PHQ2-9): Low Risk  (07/13/2018)  Tobacco Use: Medium Risk (06/06/2022)    Readmission Risk Interventions    04/04/2022    3:56 PM 02/07/2021    3:13 PM 07/31/2020   10:04 AM  Readmission Risk Prevention Plan  Transportation Screening Complete Complete Complete  PCP or Specialist Appt within 3-5 Days Complete Complete Complete  HRI or Home Care Consult Complete Complete Complete  Social Work Consult for Recovery Care Planning/Counseling Complete Complete Not Complete  SW consult not completed comments   RNCM assigned to patient  Palliative Care Screening Not Applicable Not Applicable Not Applicable  Medication Review (RN Care Manager) Complete Complete Complete

## 2022-06-11 NOTE — Progress Notes (Addendum)
   06/09/22 0045  BiPAP/CPAP/SIPAP  $ Non-Invasive Ventilator  Non-Invasive Vent Subsequent  BiPAP/CPAP/SIPAP Pt Type Adult  Mask Type Full face mask  Mask Size Medium  Set Rate 12 breaths/min  Respiratory Rate 22 breaths/min  IPAP 10 cmH20  EPAP 6 cmH2O  FiO2 (%) 40 %  Minute Ventilation 13  Leak 35  Peak Inspiratory Pressure (PIP) 11  Tidal Volume (Vt) 704  Patient Home Equipment No  Auto Titrate No  Press High Alarm 25 cmH2O  Press Low Alarm 5 cmH2O

## 2022-06-11 NOTE — Discharge Summary (Signed)
Physician Discharge Summary   Patient: Bradley [name] MRN: [id number] DOB: 02/27/1951  Admit date:     06/06/2022  Discharge date: 06/11/22  Discharge Physician: Arnetha CourserSumayya Jannae Fagerstrom   PCP: Dione Housekeeperlmedo, Mario Ernesto, MD   Recommendations at discharge:  Follow-up with pulmonology and primary care provider Patient is being discharged with home hospice services  Discharge Diagnoses: Principal Problem:   COPD exacerbation Active Problems:   Acute on chronic respiratory failure with hypoxia   Essential hypertension   HLD (hyperlipidemia)   Type II diabetes mellitus with renal manifestations   Chronic kidney disease, stage 3a   CAD (coronary artery disease)   CVD (cardiovascular disease)   Non-small cell lung cancer (NSCLC)   Alcohol abuse   Adjustment disorder with mixed anxiety and depressed mood   Hospital Course: Taken from H&P.   Bradley [name] is a 72 y.o. male with medical history significant of hypertension, hyperlipidemia, diabetes mellitus, COPD, on 4-5L of oxygen at home, depression with anxiety, CKD-3A, PVD, former smoker, alcohol abuse, CAD, NSCLC (s/p of  XRT), who presents with shortness breath for 1 day, progressively worsening and associated with wheezing and cough with yellow-colored sputum production.  Patient was found to have severe respiratory distress.  Oxygen desaturation to 81% on 4 L oxygen, using accessory muscle for breathing, cannot speak in full sentence.  Initially patient was started on nonrebreather without significant improvement. BiPAP was started in ED.   Data reviewed independently and ED Course: pt was found to have WBC 13.7, lactic acid 3.4 --> 1.4, GFR> 60, temperature normal, blood pressure 125/66, heart rate 83, RR 26, chest x-ray showed hyperinflation and chronic lung disease without infiltration.  4/5: Patient was on BiPAP, COVID PCR was negative.  Preliminary blood cultures negative.  Procalcitonin negative.  Checking respiratory viral panel to look for a  cause for his COPD exacerbation.  Leukocytosis resolved.  Blood glucose significantly elevated-most likely secondary to steroid use.  Mild worsening of creatinine at 1.25-not meeting the criteria for AKI.  4/6: Patient continued to improve, now back to his baseline oxygen requirement of 5 L at rest but do desaturate pretty quickly with minor exertion like moving around in bed.  Respiratory viral panel negative.  CBG elevated as patient is on steroid.  4/7: Patient continued to feel better.  Chest is clearing out and remained on 5 L of oxygen at rest.  We will try ambulating him again today.  Patient will need a concentrator which goes more than 5 L on discharge as he will most likely needing more oxygen.  Concern of COPD progression.  4/8: Vital stable and remain on 5 L at rest.  To desaturate up to 81% with minor exertion, requiring more oxygen.  Need home concentrator which should go up to at least 10 L of oxygen.  Home health ordered.  Apparently daughter is trying to arrange hospice services.  4/9: Patient continued to improve slowly.  Less shortness of breath and hypoxia with minor exertion but continued to require little higher level of oxygen than baseline.  He is being accepted by home hospice services and awaiting equipment delivery.  They will arrange bigger concentrator so he can use it as needed.  Patient is being given a slow tapering dose of steroid.  He can follow-up with his pulmonologist for further recommendations.  Patient with guarded prognosis.  Patient is now accepted for home hospice with [facility]-they are trying to arrange equipments, once done he can go home with hospice.  Assessment and Plan: * COPD exacerbation Patient with productive cough, worsening shortness of breath and oxygen requirement is consistent with COPD exacerbation.  Chest x-ray and procalcitonin negative.  COVID-19 PCR negative.  Respiratory viral panel negative Patient is back to baseline of oxygen  requirement at rest but desaturating significantly with minor exertion. -Continue with steroid-switching Solu-Medrol with prednisone from tomorrow -Can use BiPAP as needed -Continue with Zithromax -Continue with supportive care and bronchodilators  Acute on chronic respiratory failure with hypoxia uses 4 to 5 L of oxygen at baseline.  Improved while resting but still desaturating significantly with minor exertion requiring higher level of oxygen -Continue with supplemental oxygen to keep the saturation above 90% -Will need concentrator with higher capacity on discharge  Essential hypertension -Continue home metoprolol -As needed hydralazine  HLD (hyperlipidemia) -Continue home Crestor  Type II diabetes mellitus with renal manifestations A1c of 7.5.  Patient takes metformin and Tradjenta at home. CBG within goal after making changes yesterday -Moderate SSI -Add 3 units with meal -Add Semglee  Chronic kidney disease, stage 3a Renal function around baseline. -Monitor renal function -Avoid nephrotoxins  CAD (coronary artery disease) -Continue aspirin and Crestor  Non-small cell lung cancer (NSCLC) Patient is X/p of radiation therapy -Follow-up with oncology  CVD (cardiovascular disease) -Aspirin and Crestor  Alcohol abuse -CIWA protocol  Adjustment disorder with mixed anxiety and depressed mood Not on any medications at home. -Continue to monitor   Consultants: None Procedures performed: None Disposition: Hospice care Diet recommendation:  Discharge Diet Orders (From admission, onward)     Start     Ordered   06/11/22 0000  Diet - low sodium heart healthy        06/11/22 1344           Cardiac and Carb modified diet DISCHARGE MEDICATION: Allergies as of 06/11/2022       Reactions   Ace Inhibitors Other (See Comments)   Hypotension        Medication List     STOP taking these medications    brimonidine 0.2 % ophthalmic solution Commonly known  as: ALPHAGAN   guaiFENesin 100 MG/5ML liquid Commonly known as: ROBITUSSIN   levalbuterol 0.31 MG/3ML nebulizer solution Commonly known as: XOPENEX       TAKE these medications    albuterol 108 (90 Base) MCG/ACT inhaler Commonly known as: VENTOLIN HFA Inhale 2 puffs into the lungs every 6 (six) hours as needed for wheezing.   aspirin EC 81 MG tablet Take 81 mg by mouth every evening.   famotidine 20 MG tablet Commonly known as: PEPCID Take 20 mg by mouth daily.   fluticasone 50 MCG/ACT nasal spray Commonly known as: FLONASE Place 1 spray into both nostrils daily as needed for allergies or rhinitis.   folic acid 1 MG tablet Commonly known as: FOLVITE Take 1 tablet (1 mg total) by mouth daily. Start taking on: June 12, 2022   furosemide 20 MG tablet Commonly known as: LASIX Take 20 mg by mouth as needed for edema.   ipratropium-albuterol 0.5-2.5 (3) MG/3ML Soln Commonly known as: DUONEB Take 3 mLs by nebulization every 4 (four) hours as needed.   linagliptin 5 MG Tabs tablet Commonly known as: TRADJENTA Take 5 mg by mouth every morning.   metFORMIN 750 MG 24 hr tablet Commonly known as: GLUCOPHAGE-XR Take 750 mg by mouth 2 (two) times daily.   metoprolol tartrate 50 MG tablet Commonly known as: LOPRESSOR Take 50 mg by mouth 2 (two) times  daily.   montelukast 10 MG tablet Commonly known as: SINGULAIR Take 10 mg by mouth at bedtime.   multivitamin with minerals Tabs tablet Take 1 tablet by mouth daily. Start taking on: June 12, 2022   predniSONE 10 MG (21) Tbpk tablet Commonly known as: STERAPRED UNI-PAK 21 TAB Start tomorrow with 4 tablets, decrease 1 tablet every 2 days until you finish   rosuvastatin 40 MG tablet Commonly known as: CRESTOR Take 40 mg by mouth every morning.   sildenafil 20 MG tablet Commonly known as: REVATIO Take 20-100 mg by mouth as needed.   thiamine 100 MG tablet Commonly known as: Vitamin B-1 Take 1 tablet (100 mg  total) by mouth daily. Start taking on: June 12, 2022   Trelegy Ellipta 100-62.5-25 MCG/ACT Aepb Generic drug: Fluticasone-Umeclidin-Vilant Inhale 1 puff into the lungs daily.        Follow-up Information     Zada Finders Joycie Peek, MD. Schedule an appointment as soon as possible for a visit in 1 week(s).   Specialty: Family Medicine Contact information: [address] [place] [address] [phone]         Mertie Moores, MD. Schedule an appointment as soon as possible for a visit in 1 week(s).   Specialty: Specialist Contact information: [address] [place] [address] [phone]                Discharge Exam: Filed Weights   06/06/22 1107  Weight: 81.6 kg   General.  Frail elderly man, in no acute distress. Pulmonary.  Lungs clear bilaterally, normal respiratory effort. CV.  Regular rate and rhythm, no JVD, rub or murmur. Abdomen.  Soft, nontender, nondistended, BS positive. CNS.  Alert and oriented .  No focal neurologic deficit. Extremities.  No edema, no cyanosis, pulses intact and symmetrical. Psychiatry.  Judgment and insight appears normal.   Condition at discharge: stable  The results of significant diagnostics from this hospitalization (including imaging, microbiology, ancillary and laboratory) are listed below for reference.   Imaging Studies: DG Chest Port 1 View  Result Date: 06/06/2022 CLINICAL DATA:  Questionable sepsis EXAM: PORTABLE CHEST 1 VIEW COMPARISON:  04/01/2022 and older.  CT angiogram 02/17/2022 FINDINGS: Hyperinflation with chronic lung changes again seen. There is blunting of the right costophrenic angle. Tiny effusion versus pleural thickening. Apical pleural thickening as well. No consolidation or edema. Previous subtle opacity of the right lung base is improving. Normal cardiopericardial silhouette. Calcified aorta. Overlapping cardiac leads. IMPRESSION: Hyperinflation with chronic lung changes. Tiny right  effusion versus pleural thickening. Electronically Signed   By: Karen Kays M.D.   On: 06/06/2022 12:09    Microbiology: Results for orders placed or performed during the hospital encounter of 06/06/22  Blood Culture (routine x 2)     Status: None   Collection Time: 06/06/22 11:34 AM   Specimen: BLOOD  Result Value Ref Range Status   Specimen Description BLOOD BLOOD LEFT ARM  Final   Special Requests   Final    BOTTLES DRAWN AEROBIC AND ANAEROBIC Blood Culture results may not be optimal due to an inadequate volume of blood received in culture bottles   Culture   Final    NO GROWTH 5 DAYS Performed at [facility], [address] [place], [place] [address]    Report Status 06/11/2022 FINAL  Final  Blood Culture (routine x 2)     Status: None   Collection Time: 06/06/22 11:51 AM   Specimen: BLOOD  Result  Value Ref Range Status   Specimen Description BLOOD LEFT ANTECUBITAL  Final   Special Requests   Final    BOTTLES DRAWN AEROBIC AND ANAEROBIC Blood Culture adequate volume   Culture   Final    NO GROWTH 5 DAYS Performed at [facility], [address] [place], [place] [address]    Report Status 06/11/2022 FINAL  Final  SARS Coronavirus 2 by RT PCR (hospital order, performed in [facility] hospital lab) *cepheid single result test* Anterior Nasal Swab     Status: None   Collection Time: 06/06/22 11:52 AM   Specimen: Anterior Nasal Swab  Result Value Ref Range Status   SARS Coronavirus 2 by RT PCR NEGATIVE NEGATIVE Final    Comment: (NOTE) SARS-CoV-2 target nucleic acids are NOT DETECTED.  The SARS-CoV-2 RNA is generally detectable in upper and lower respiratory specimens during the acute phase of infection. The lowest concentration of SARS-CoV-2 viral copies this assay can detect is 250 copies / mL. A negative result does not preclude SARS-CoV-2 infection and should not be used as the sole basis for treatment or other patient management decisions.  A  negative result may occur with improper specimen collection / handling, submission of specimen other than nasopharyngeal swab, presence of viral mutation(s) within the areas targeted by this assay, and inadequate number of viral copies (<250 copies / mL). A negative result must be combined with clinical observations, patient history, and epidemiological information.  Fact Sheet for Patients:   RoadLapTop.co.za  Fact Sheet for Healthcare Providers: http://kim-miller.com/  This test is not yet approved or  cleared by the [place] FDA and has been authorized for detection and/or diagnosis of SARS-CoV-2 by FDA under an Emergency Use Authorization (EUA).  This EUA will remain in effect (meaning this test can be used) for the duration of the COVID-19 declaration under Section 564(b)(1) of the Act, 21 U.S.C. section 360bbb-3(b)(1), unless the authorization is terminated or revoked sooner.  Performed at [facility], [address] [place], [place] [address]   Expectorated Sputum Assessment w Gram Stain, Rflx to Resp Cult     Status: None   Collection Time: 06/06/22  3:01 PM   Specimen: Sputum  Result Value Ref Range Status   Specimen Description SPUTUM  Final   Special Requests NONE  Final   Sputum evaluation   Final    THIS SPECIMEN IS ACCEPTABLE FOR SPUTUM CULTURE Performed at [facility], [address] [place], [place] [address]    Report Status 06/06/2022 FINAL  Final  Culture, Respiratory w Gram Stain     Status: None (Preliminary result)   Collection Time: 06/06/22  3:01 PM   Specimen: SPU  Result Value Ref Range Status   Specimen Description   Final    SPUTUM Performed at [facility], [address], [place], [place] [address]    Special Requests   Final    NONE Reflexed from [id number] Performed at [facility], [address] [place], [place] [address]    Gram Stain   Final     MODERATE WBC PRESENT, PREDOMINANTLY PMN FEW GRAM NEGATIVE COCCI RARE GRAM POSITIVE COCCI    Culture   Final    MODERATE PSEUDOMONAS AERUGINOSA Sent to [facility] for further susceptibility testing. Performed at [facility], [address] [place], [place] [address]    Report Status PENDING  Incomplete  Susceptibility, Aer + Anaerob     Status: Abnormal   Collection Time:  06/06/22  4:00 PM  Result Value Ref Range Status   Suscept, Aer + Anaerob Preliminary report (A)  Final    Comment: (NOTE) Performed At: [facility] [address] [place], [place] [id number] Jolene Schimke MD NA:[id number]    Source of Sample [phone] PAER SENSI SPUTUM  Final    Comment: Performed at [facility], [address], [place], [place] [address]  Susceptibility Result     Status: Abnormal   Collection Time: 06/06/22  4:00 PM  Result Value Ref Range Status   Suscept Result 1 Comment (A)  Final    Comment: (NOTE) Pseudomonas aeruginosa Identification performed by account, not confirmed by this laboratory. Performed At: [facility] [address] [place], [place] [id number] Jolene Schimke MD VP:[id number]   Respiratory (~20 pathogens) panel by PCR     Status: None   Collection Time: 06/07/22  9:06 AM   Specimen: Nasopharyngeal Swab; Respiratory  Result Value Ref Range Status   Adenovirus NOT DETECTED NOT DETECTED Final   Coronavirus 229E NOT DETECTED NOT DETECTED Final    Comment: (NOTE) The Coronavirus on the Respiratory Panel, DOES NOT test for the novel  Coronavirus (2019 nCoV)    Coronavirus HKU1 NOT DETECTED NOT DETECTED Final   Coronavirus NL63 NOT DETECTED NOT DETECTED Final   Coronavirus OC43 NOT DETECTED NOT DETECTED Final   Metapneumovirus NOT DETECTED NOT DETECTED Final   Rhinovirus / Enterovirus NOT DETECTED NOT DETECTED Final   Influenza A NOT DETECTED NOT DETECTED Final   Influenza B NOT DETECTED NOT DETECTED Final   Parainfluenza Virus 1 NOT DETECTED  NOT DETECTED Final   Parainfluenza Virus 2 NOT DETECTED NOT DETECTED Final   Parainfluenza Virus 3 NOT DETECTED NOT DETECTED Final   Parainfluenza Virus 4 NOT DETECTED NOT DETECTED Final   Respiratory Syncytial Virus NOT DETECTED NOT DETECTED Final   Bordetella pertussis NOT DETECTED NOT DETECTED Final   Bordetella Parapertussis NOT DETECTED NOT DETECTED Final   Chlamydophila pneumoniae NOT DETECTED NOT DETECTED Final   Mycoplasma pneumoniae NOT DETECTED NOT DETECTED Final    Comment: Performed at [facility], [address], [place], [place] [address]    Labs: CBC: Recent Labs  Lab 06/06/22 1134 06/07/22 0439  WBC 13.7* 8.0  NEUTROABS 10.5*  --   HGB 16.3 12.7*  HCT 52.3* 40.2  MCV 90.8 89.7  PLT 269 230   Basic Metabolic Panel: Recent Labs  Lab 06/06/22 1134 06/07/22 0439 06/08/22 0651  NA 139 138 140  K 3.9 4.4 4.4  CL 106 110 108  CO2 21* 22 21*  GLUCOSE 166* 415* 270*  BUN 12 21 25*  CREATININE 1.17 1.25* 1.10  CALCIUM 9.4 8.8* 8.9   Liver Function Tests: Recent Labs  Lab 06/06/22 1134  AST 38  ALT 38  ALKPHOS 125  BILITOT 1.5*  PROT 8.3*  ALBUMIN 4.2   CBG: Recent Labs  Lab 06/10/22 1159 06/10/22 1537 06/10/22 2020 06/11/22 0809 06/11/22 1146  GLUCAP 143* 244* 321* 165* 223*    Discharge time spent: greater than 30 minutes.  This record has been created using Conservation officer, historic buildings. Errors have been sought and corrected,but may not always be located. Such creation errors do not reflect on the standard of care.   Signed: Arnetha Courser, MD [facility] 06/11/2022

## 2022-06-11 NOTE — Inpatient Diabetes Management (Signed)
Inpatient Diabetes Program Recommendations  AACE/ADA: New Consensus Statement on Inpatient Glycemic Control (2015)  Target Ranges:  Prepandial:   less than 140 mg/dL      Peak postprandial:   less than 180 mg/dL (1-2 hours)      Critically ill patients:  140 - 180 mg/dL    Latest Reference Range & Units 06/09/22 08:18 06/09/22 11:19 06/09/22 17:10 06/09/22 20:32  Glucose-Capillary 70 - 99 mg/dL 655 (H)  5 units Novolog  169 (H)  6 units Novolog  10 units Semglee  312 (H)  14 units Novolog  269 (H)  3 units Novolog  10 units Semglee   (H): Data is abnormally high  Latest Reference Range & Units 06/10/22 07:51 06/10/22 11:59 06/10/22 15:37 06/10/22 20:20  Glucose-Capillary 70 - 99 mg/dL 374 (H)  6 units Novolog  143 (H)  5 units Novolog  10 units Semglee  244 (H)  8 units Novolog  321 (H)  4 units Novolog  10 units Semglee   (H): Data is abnormally high     Home DM Meds: Metformin 750 mg BID        Tradjenta 5 mg daily   Current Orders: Semglee 10 units BID      Novolog Moderate Correction Scale/ SSI (0-15 units) TID AC + HS      Novolog 3 units TID with meals     MD- Note pt getting Prednisone 50 mg daily  CBGs at 4pm and bedtime significantly elevated  May consider adding Novolog Meal Coverage for Lunch and Dinner Only:  Novolog 4 units BID Lunch and Dinner (12pm and 5pm)    --Will follow patient during hospitalization--  Ambrose Finland RN, MSN, CDCES Diabetes Coordinator Inpatient Glycemic Control Team Team Pager: [phone] (8a-5p)

## 2022-06-12 LAB — GLUCOSE, CAPILLARY: Glucose-Capillary: 286 mg/dL — ABNORMAL HIGH (ref 70–99)

## 2022-06-12 NOTE — TOC Transition Note (Signed)
Transition of Care [facility]) - CM/SW Discharge Note   Patient Details  Name: Bradley [name] MRN: [id number] Date of Birth: [birth year]  Transition of Care [facility]) CM/SW Contact:  Truddie Hidden, RN Phone Number: 06/12/2022, 9:53 AM   Clinical Narrative:    Received confirmation from Marco Collie from [facility] that all DME has been received.   Contacted patient's duaghter, [name]. She was advised patient discharge order was received and EM would be transporting him to his home address today.   Face sheet and medical necessity forms printed to the floor. RN notified.   TOC signing off .         Patient Goals and CMS Choice      Discharge Placement                         Discharge Plan and Services Additional resources added to the After Visit Summary for                                       Social Determinants of Health (SDOH) Interventions SDOH Screenings   Food Insecurity: No Food Insecurity (06/07/2022)  Housing: Low Risk  (06/07/2022)  Transportation Needs: No Transportation Needs (06/07/2022)  Utilities: Not At Risk (06/07/2022)  Depression (PHQ2-9): Low Risk  (07/13/2018)  Tobacco Use: Medium Risk (06/06/2022)     Readmission Risk Interventions    04/04/2022    3:56 PM 02/07/2021    3:13 PM 07/31/2020   10:04 AM  Readmission Risk Prevention Plan  Transportation Screening Complete Complete Complete  PCP or Specialist Appt within 3-5 Days Complete Complete Complete  HRI or Home Care Consult Complete Complete Complete  Social Work Consult for Recovery Care Planning/Counseling Complete Complete Not Complete  SW consult not completed comments   RNCM assigned to patient  Palliative Care Screening Not Applicable Not Applicable Not Applicable  Medication Review (RN Care Manager) Complete Complete Complete

## 2022-06-13 LAB — SUSCEPTIBILITY, AER + ANAEROB: Source of Sample: 8680

## 2022-06-15 LAB — SUSCEPTIBILITY RESULT

## 2022-06-16 LAB — SUSCEPTIBILITY, AER + ANAEROB

## 2022-06-16 LAB — SUSCEPTIBILITY RESULT

## 2022-06-18 LAB — CULTURE, RESPIRATORY W GRAM STAIN

## 2022-07-17 ENCOUNTER — Other Ambulatory Visit: Payer: Medicare HMO

## 2022-07-24 ENCOUNTER — Ambulatory Visit: Payer: Medicare HMO | Admitting: Radiation Oncology

## 2022-09-06 ENCOUNTER — Emergency Department: Payer: Medicare Other

## 2022-09-06 ENCOUNTER — Inpatient Hospital Stay
Admission: EM | Admit: 2022-09-06 | Discharge: 2022-09-13 | DRG: 189 | Disposition: A | Discharge status: Hospice | Payer: Medicare Other | Attending: Internal Medicine | Admitting: Internal Medicine

## 2022-09-06 ENCOUNTER — Other Ambulatory Visit: Payer: Self-pay

## 2022-09-06 DIAGNOSIS — Z79899 Other long term (current) drug therapy: Secondary | ICD-10-CM | POA: Diagnosis not present

## 2022-09-06 DIAGNOSIS — Z7984 Long term (current) use of oral hypoglycemic drugs: Secondary | ICD-10-CM

## 2022-09-06 DIAGNOSIS — E785 Hyperlipidemia, unspecified: Secondary | ICD-10-CM | POA: Diagnosis present

## 2022-09-06 DIAGNOSIS — F418 Other specified anxiety disorders: Secondary | ICD-10-CM | POA: Diagnosis present

## 2022-09-06 DIAGNOSIS — K5900 Constipation, unspecified: Secondary | ICD-10-CM | POA: Diagnosis not present

## 2022-09-06 DIAGNOSIS — C3492 Malignant neoplasm of unspecified part of left bronchus or lung: Secondary | ICD-10-CM | POA: Diagnosis not present

## 2022-09-06 DIAGNOSIS — E1122 Type 2 diabetes mellitus with diabetic chronic kidney disease: Secondary | ICD-10-CM | POA: Diagnosis present

## 2022-09-06 DIAGNOSIS — Z66 Do not resuscitate: Secondary | ICD-10-CM | POA: Diagnosis present

## 2022-09-06 DIAGNOSIS — Z888 Allergy status to other drugs, medicaments and biological substances status: Secondary | ICD-10-CM | POA: Diagnosis not present

## 2022-09-06 DIAGNOSIS — E43 Unspecified severe protein-calorie malnutrition: Secondary | ICD-10-CM | POA: Diagnosis present

## 2022-09-06 DIAGNOSIS — I129 Hypertensive chronic kidney disease with stage 1 through stage 4 chronic kidney disease, or unspecified chronic kidney disease: Secondary | ICD-10-CM | POA: Diagnosis present

## 2022-09-06 DIAGNOSIS — E440 Moderate protein-calorie malnutrition: Secondary | ICD-10-CM | POA: Diagnosis not present

## 2022-09-06 DIAGNOSIS — Z6823 Body mass index (BMI) 23.0-23.9, adult: Secondary | ICD-10-CM

## 2022-09-06 DIAGNOSIS — J9621 Acute and chronic respiratory failure with hypoxia: Secondary | ICD-10-CM | POA: Diagnosis not present

## 2022-09-06 DIAGNOSIS — J432 Centrilobular emphysema: Secondary | ICD-10-CM | POA: Diagnosis present

## 2022-09-06 DIAGNOSIS — C3490 Malignant neoplasm of unspecified part of unspecified bronchus or lung: Secondary | ICD-10-CM | POA: Diagnosis not present

## 2022-09-06 DIAGNOSIS — Z9981 Dependence on supplemental oxygen: Secondary | ICD-10-CM | POA: Diagnosis not present

## 2022-09-06 DIAGNOSIS — F419 Anxiety disorder, unspecified: Secondary | ICD-10-CM | POA: Diagnosis not present

## 2022-09-06 DIAGNOSIS — F32A Depression, unspecified: Secondary | ICD-10-CM | POA: Diagnosis not present

## 2022-09-06 DIAGNOSIS — E1151 Type 2 diabetes mellitus with diabetic peripheral angiopathy without gangrene: Secondary | ICD-10-CM | POA: Diagnosis present

## 2022-09-06 DIAGNOSIS — Z515 Encounter for palliative care: Secondary | ICD-10-CM

## 2022-09-06 DIAGNOSIS — Z7951 Long term (current) use of inhaled steroids: Secondary | ICD-10-CM | POA: Diagnosis not present

## 2022-09-06 DIAGNOSIS — E1165 Type 2 diabetes mellitus with hyperglycemia: Secondary | ICD-10-CM | POA: Diagnosis not present

## 2022-09-06 DIAGNOSIS — J441 Chronic obstructive pulmonary disease with (acute) exacerbation: Secondary | ICD-10-CM | POA: Diagnosis not present

## 2022-09-06 DIAGNOSIS — Z7982 Long term (current) use of aspirin: Secondary | ICD-10-CM | POA: Diagnosis not present

## 2022-09-06 DIAGNOSIS — Z8249 Family history of ischemic heart disease and other diseases of the circulatory system: Secondary | ICD-10-CM

## 2022-09-06 DIAGNOSIS — F1021 Alcohol dependence, in remission: Secondary | ICD-10-CM | POA: Diagnosis present

## 2022-09-06 DIAGNOSIS — J9611 Chronic respiratory failure with hypoxia: Secondary | ICD-10-CM

## 2022-09-06 DIAGNOSIS — Z85118 Personal history of other malignant neoplasm of bronchus and lung: Secondary | ICD-10-CM

## 2022-09-06 DIAGNOSIS — I2510 Atherosclerotic heart disease of native coronary artery without angina pectoris: Secondary | ICD-10-CM | POA: Diagnosis not present

## 2022-09-06 DIAGNOSIS — R000 Tachycardia, unspecified: Secondary | ICD-10-CM | POA: Diagnosis present

## 2022-09-06 DIAGNOSIS — Z809 Family history of malignant neoplasm, unspecified: Secondary | ICD-10-CM

## 2022-09-06 DIAGNOSIS — K5909 Other constipation: Secondary | ICD-10-CM | POA: Diagnosis not present

## 2022-09-06 DIAGNOSIS — N1831 Chronic kidney disease, stage 3a: Secondary | ICD-10-CM | POA: Diagnosis not present

## 2022-09-06 DIAGNOSIS — I10 Essential (primary) hypertension: Secondary | ICD-10-CM | POA: Diagnosis not present

## 2022-09-06 DIAGNOSIS — I4510 Unspecified right bundle-branch block: Secondary | ICD-10-CM | POA: Diagnosis not present

## 2022-09-06 DIAGNOSIS — C3491 Malignant neoplasm of unspecified part of right bronchus or lung: Secondary | ICD-10-CM | POA: Diagnosis not present

## 2022-09-06 DIAGNOSIS — Z87891 Personal history of nicotine dependence: Secondary | ICD-10-CM

## 2022-09-06 LAB — BLOOD GAS, VENOUS
Acid-Base Excess: 2 mmol/L (ref 0.0–2.0)
Bicarbonate: 25.6 mmol/L (ref 20.0–28.0)
O2 Saturation: 75.9 %
Patient temperature: 37
pCO2, Ven: 36 mmHg — ABNORMAL LOW (ref 44–60)
pH, Ven: 7.46 — ABNORMAL HIGH (ref 7.25–7.43)
pO2, Ven: 44 mmHg (ref 32–45)

## 2022-09-06 LAB — COMPREHENSIVE METABOLIC PANEL
ALT: 29 U/L (ref 0–44)
AST: 25 U/L (ref 15–41)
Albumin: 3.7 g/dL (ref 3.5–5.0)
Alkaline Phosphatase: 78 U/L (ref 38–126)
Anion gap: 14 (ref 5–15)
BUN: 25 mg/dL — ABNORMAL HIGH (ref 8–23)
CO2: 20 mmol/L — ABNORMAL LOW (ref 22–32)
Calcium: 9.2 mg/dL (ref 8.9–10.3)
Chloride: 103 mmol/L (ref 98–111)
Creatinine, Ser: 1.4 mg/dL — ABNORMAL HIGH (ref 0.61–1.24)
GFR, Estimated: 53 mL/min — ABNORMAL LOW (ref >60)
Glucose, Bld: 205 mg/dL — ABNORMAL HIGH (ref 70–99)
Potassium: 4.1 mmol/L (ref 3.5–5.1)
Sodium: 137 mmol/L (ref 135–145)
Total Bilirubin: 0.6 mg/dL (ref 0.3–1.2)
Total Protein: 6.8 g/dL (ref 6.5–8.1)

## 2022-09-06 LAB — CBC WITH DIFFERENTIAL/PLATELET
Abs Immature Granulocytes: 0.17 10*3/uL — ABNORMAL HIGH (ref 0.00–0.07)
Basophils Absolute: 0 10*3/uL (ref 0.0–0.1)
Basophils Relative: 0 %
Eosinophils Absolute: 0 10*3/uL (ref 0.0–0.5)
Eosinophils Relative: 0 %
HCT: 45.8 % (ref 39.0–52.0)
Hemoglobin: 14.7 g/dL (ref 13.0–17.0)
Immature Granulocytes: 1 %
Lymphocytes Relative: 10 %
Lymphs Abs: 1.4 10*3/uL (ref 0.7–4.0)
MCH: 28.3 pg (ref 26.0–34.0)
MCHC: 32.1 g/dL (ref 30.0–36.0)
MCV: 88.2 fL (ref 80.0–100.0)
Monocytes Absolute: 1.2 10*3/uL — ABNORMAL HIGH (ref 0.1–1.0)
Monocytes Relative: 8 %
Neutro Abs: 12 10*3/uL — ABNORMAL HIGH (ref 1.7–7.7)
Neutrophils Relative %: 81 %
Platelets: 274 10*3/uL (ref 150–400)
RBC: 5.19 MIL/uL (ref 4.22–5.81)
RDW: 15.6 % — ABNORMAL HIGH (ref 11.5–15.5)
WBC: 14.9 10*3/uL — ABNORMAL HIGH (ref 4.0–10.5)
nRBC: 0 % (ref 0.0–0.2)

## 2022-09-06 LAB — BRAIN NATRIURETIC PEPTIDE: B Natriuretic Peptide: 330.1 pg/mL — ABNORMAL HIGH (ref 0.0–100.0)

## 2022-09-06 LAB — D-DIMER, QUANTITATIVE: D-Dimer, Quant: 0.89 ug/mL-FEU — ABNORMAL HIGH (ref 0.00–0.50)

## 2022-09-06 LAB — TROPONIN I (HIGH SENSITIVITY): Troponin I (High Sensitivity): 13 ng/L (ref <18)

## 2022-09-06 MED ORDER — IPRATROPIUM-ALBUTEROL 0.5-2.5 (3) MG/3ML IN SOLN
3.0000 mL | Freq: Four times a day (QID) | RESPIRATORY_TRACT | Status: DC
  Administered 2022-09-07 – 2022-09-08 (×6): 3 mL via RESPIRATORY_TRACT
  Filled 2022-09-06 (×6): qty 3

## 2022-09-06 MED ORDER — ROSUVASTATIN CALCIUM 10 MG PO TABS
40.0000 mg | ORAL_TABLET | ORAL | Status: DC
  Administered 2022-09-07 – 2022-09-13 (×7): 40 mg via ORAL
  Filled 2022-09-06 (×7): qty 4
  Filled 2022-09-06: qty 2

## 2022-09-06 MED ORDER — FAMOTIDINE 20 MG PO TABS
20.0000 mg | ORAL_TABLET | Freq: Every day | ORAL | Status: DC
  Administered 2022-09-07 – 2022-09-13 (×7): 20 mg via ORAL
  Filled 2022-09-06 (×7): qty 1

## 2022-09-06 MED ORDER — SODIUM CHLORIDE 0.9 % IV SOLN
1.0000 g | INTRAVENOUS | Status: AC
  Administered 2022-09-07 – 2022-09-10 (×5): 1 g via INTRAVENOUS
  Filled 2022-09-06 (×5): qty 10

## 2022-09-06 MED ORDER — INSULIN ASPART 100 UNIT/ML IJ SOLN
0.0000 [IU] | Freq: Every day | INTRAMUSCULAR | Status: DC
  Administered 2022-09-07: 2 [IU] via SUBCUTANEOUS
  Administered 2022-09-07: 5 [IU] via SUBCUTANEOUS
  Administered 2022-09-08 – 2022-09-12 (×4): 2 [IU] via SUBCUTANEOUS
  Filled 2022-09-06 (×7): qty 1

## 2022-09-06 MED ORDER — SODIUM CHLORIDE 0.9 % IV SOLN
500.0000 mg | Freq: Once | INTRAVENOUS | Status: AC
  Administered 2022-09-06: 500 mg via INTRAVENOUS
  Filled 2022-09-06: qty 5

## 2022-09-06 MED ORDER — ALBUTEROL SULFATE (2.5 MG/3ML) 0.083% IN NEBU: 2.5000 mg | INHALATION_SOLUTION | RESPIRATORY_TRACT | Status: DC | PRN

## 2022-09-06 MED ORDER — HYDROCODONE-ACETAMINOPHEN 5-325 MG PO TABS: 1.0000 | ORAL_TABLET | ORAL | Status: DC | PRN

## 2022-09-06 MED ORDER — SODIUM CHLORIDE 0.9 % IV BOLUS
1000.0000 mL | Freq: Once | INTRAVENOUS | Status: AC
  Administered 2022-09-07: 1000 mL via INTRAVENOUS

## 2022-09-06 MED ORDER — ASPIRIN 81 MG PO TBEC
81.0000 mg | DELAYED_RELEASE_TABLET | Freq: Every evening | ORAL | Status: DC
  Administered 2022-09-07 – 2022-09-13 (×7): 81 mg via ORAL
  Filled 2022-09-06 (×7): qty 1

## 2022-09-06 MED ORDER — ACETAMINOPHEN 325 MG PO TABS
650.0000 mg | ORAL_TABLET | Freq: Four times a day (QID) | ORAL | Status: DC | PRN
  Administered 2022-09-10: 650 mg via ORAL
  Filled 2022-09-06: qty 2

## 2022-09-06 MED ORDER — ALBUTEROL SULFATE (2.5 MG/3ML) 0.083% IN NEBU
2.5000 mg | INHALATION_SOLUTION | Freq: Once | RESPIRATORY_TRACT | Status: AC
  Administered 2022-09-06: 2.5 mg via RESPIRATORY_TRACT
  Filled 2022-09-06: qty 3

## 2022-09-06 MED ORDER — LINAGLIPTIN 5 MG PO TABS
5.0000 mg | ORAL_TABLET | ORAL | Status: DC
  Administered 2022-09-07 – 2022-09-13 (×7): 5 mg via ORAL
  Filled 2022-09-06 (×8): qty 1

## 2022-09-06 MED ORDER — ONDANSETRON HCL 4 MG/2ML IJ SOLN: 4.0000 mg | Freq: Four times a day (QID) | INTRAMUSCULAR | Status: DC | PRN

## 2022-09-06 MED ORDER — ONDANSETRON HCL 4 MG PO TABS
4.0000 mg | ORAL_TABLET | Freq: Four times a day (QID) | ORAL | Status: DC | PRN
  Administered 2022-09-10: 4 mg via ORAL
  Filled 2022-09-06: qty 1

## 2022-09-06 MED ORDER — GUAIFENESIN ER 600 MG PO TB12
600.0000 mg | ORAL_TABLET | Freq: Two times a day (BID) | ORAL | Status: DC
  Administered 2022-09-07 – 2022-09-13 (×14): 600 mg via ORAL
  Filled 2022-09-06 (×14): qty 1

## 2022-09-06 MED ORDER — PREDNISONE 20 MG PO TABS: 40.0000 mg | ORAL_TABLET | Freq: Every day | ORAL | Status: DC

## 2022-09-06 MED ORDER — METHYLPREDNISOLONE SODIUM SUCC 40 MG IJ SOLR
40.0000 mg | Freq: Two times a day (BID) | INTRAMUSCULAR | Status: AC
  Administered 2022-09-07 (×2): 40 mg via INTRAVENOUS
  Filled 2022-09-06 (×2): qty 1

## 2022-09-06 MED ORDER — METOPROLOL TARTRATE 50 MG PO TABS
50.0000 mg | ORAL_TABLET | Freq: Two times a day (BID) | ORAL | Status: DC
  Administered 2022-09-07: 50 mg via ORAL
  Filled 2022-09-06 (×2): qty 1

## 2022-09-06 MED ORDER — ENOXAPARIN SODIUM 40 MG/0.4ML IJ SOSY
40.0000 mg | PREFILLED_SYRINGE | INTRAMUSCULAR | Status: DC
  Administered 2022-09-07 – 2022-09-12 (×7): 40 mg via SUBCUTANEOUS
  Filled 2022-09-06 (×7): qty 0.4

## 2022-09-06 MED ORDER — LORAZEPAM 0.5 MG PO TABS
0.5000 mg | ORAL_TABLET | Freq: Four times a day (QID) | ORAL | Status: DC | PRN
  Administered 2022-09-07 (×2): 0.5 mg via ORAL
  Filled 2022-09-06 (×2): qty 1

## 2022-09-06 MED ORDER — INSULIN ASPART 100 UNIT/ML IJ SOLN
0.0000 [IU] | Freq: Three times a day (TID) | INTRAMUSCULAR | Status: DC
  Administered 2022-09-07: 15 [IU] via SUBCUTANEOUS
  Administered 2022-09-07: 4 [IU] via SUBCUTANEOUS
  Administered 2022-09-07: 11 [IU] via SUBCUTANEOUS
  Administered 2022-09-08: 3 [IU] via SUBCUTANEOUS
  Administered 2022-09-08: 7 [IU] via SUBCUTANEOUS
  Administered 2022-09-08 – 2022-09-09 (×2): 3 [IU] via SUBCUTANEOUS
  Administered 2022-09-09: 11 [IU] via SUBCUTANEOUS
  Administered 2022-09-09: 4 [IU] via SUBCUTANEOUS
  Administered 2022-09-10 (×2): 11 [IU] via SUBCUTANEOUS
  Administered 2022-09-11 (×2): 7 [IU] via SUBCUTANEOUS
  Administered 2022-09-12: 4 [IU] via SUBCUTANEOUS
  Administered 2022-09-12 (×2): 11 [IU] via SUBCUTANEOUS
  Administered 2022-09-13: 4 [IU] via SUBCUTANEOUS
  Administered 2022-09-13 (×2): 7 [IU] via SUBCUTANEOUS
  Filled 2022-09-06 (×19): qty 1

## 2022-09-06 MED ORDER — ACETAMINOPHEN 650 MG RE SUPP: 650.0000 mg | Freq: Four times a day (QID) | RECTAL | Status: DC | PRN

## 2022-09-06 NOTE — Assessment & Plan Note (Signed)
Continue Tradjenta.  Hold metformin Sliding scale insulin coverage.  Add Semglee insulin while on steroids. Last hemoglobin A1c 7.5 back in January.

## 2022-09-06 NOTE — H&P (Signed)
History and Physical    Patient: Bradley [name] ZOX:[id number] DOB: 1950-03-09 DOA: 09/06/2022 DOS: the patient was seen and examined on 09/06/2022 PCP: Dione Housekeeper, MD  Patient coming from: Home  Chief Complaint:  Chief Complaint  Patient presents with   Shortness of Breath    HPI: Bradley [name] is a 72 y.o. male with medical history significant for HTN, DM, PAD, CAD, CKD 3a, depression and anxiety,NSCLC (s/p of XRT), COPD on 6 L O2 last hospitalized in April 2024 with COPD requiring BiPAP, discharged to hospice, who presents to the ED with a several day history of progressively worsening wheezing and shortness of breath unrelieved with home treatments, acutely worsening on the day of arrival.  .He arrived by EMS who administered DuoNebs and Solu-Medrol en route.  He has a nonproductive cough and no fever and chills no chest pain.  Denies lower extremity pain or swelling. ED course And data review: Tachycardic to 106, tachypneic to 23 but afebrile and normotensive O2 sats low to mid 90s on 6 L. VBG with pH 7.46 and pCO2 36.  Troponin 13 and BNP 330.  WBC 14.9.  Creatinine at baseline at 1.4.  Blood glucose elevated at 205. EKG, personally viewed and interpreted showing sinus tachycardia with RBBB Chest x-ray showed chronic changes without acute abnormality. Patient treated with additional albuterol and given a dose of IV azithromycin. Hospitalist consulted for admission.   Review of Systems: As mentioned in the history of present illness. All other systems reviewed and are negative.  Past Medical History:  Diagnosis Date   Alcohol use disorder    Anemia    Anxiety    COPD (chronic obstructive pulmonary disease) (HCC)    Coronary artery disease    Depression    Diabetes mellitus without complication (HCC)    Family history of adverse reaction to anesthesia    daughter-spinal headache afer epidural   History of tobacco abuse    Hyperlipidemia    Hypertension    Hyponatremia     Left carotid artery stenosis    Lung mass    Malnutrition (HCC)    Nausea vomiting and diarrhea 02/17/2022   PAD (peripheral artery disease) (HCC)    Pneumonia    Sleep apnea    does not use cpap   Spinal headache    after lung surgery   Stage 2 chronic kidney disease    Tachycardia    Past Surgical History:  Procedure Laterality Date   CAROTID STENT Left    LEFT HEART CATH AND CORONARY ANGIOGRAPHY Left 11/02/2019   Procedure: LEFT HEART CATH AND CORONARY ANGIOGRAPHY;  Surgeon: Dalia Heading, MD;  Location: [facility] INVASIVE CV LAB;  Service: Cardiovascular;  Laterality: Left;   LUNG BIOPSY Right    2006   right side partial lobe removed   1990   Social History:  reports that he quit smoking about 7 years ago. His smoking use included cigarettes. He has a 52.00 pack-year smoking history. He has never used smokeless tobacco. He reports that he does not currently use alcohol after a past usage of about 3.0 standard drinks of alcohol per week. He reports that he does not use drugs.  Allergies  Allergen Reactions   Ace Inhibitors Other (See Comments)    Hypotension    Family History  Problem Relation Age of Onset   Cancer Mother    Heart attack Father    Cancer Sister     Prior to Admission medications  Medication Sig Start Date End Date Taking? Authorizing Provider  LORazepam (ATIVAN) 0.5 MG tablet Take 0.5 mg by mouth every 6 (six) hours as needed. 08/27/22  Yes [provider]  thiamine (VITAMIN B1) 100 MG tablet Take 100 mg by mouth daily. 06/11/22  Yes [provider]  albuterol (VENTOLIN HFA) 108 (90 Base) MCG/ACT inhaler Inhale 2 puffs into the lungs every 6 (six) hours as needed for wheezing. 05/01/22 05/01/23  [provider]  aspirin EC 81 MG tablet Take 81 mg by mouth every evening.     [provider]  famotidine (PEPCID) 20 MG tablet Take 20 mg by mouth daily.    [provider]  fluticasone (FLONASE) 50 MCG/ACT nasal  spray Place 1 spray into both nostrils daily as needed for allergies or rhinitis. 06/11/22   Arnetha Courser, MD  folic acid (FOLVITE) 1 MG tablet Take 1 tablet (1 mg total) by mouth daily. 06/12/22   Arnetha Courser, MD  furosemide (LASIX) 20 MG tablet Take 20 mg by mouth as needed for edema. Patient not taking: Reported on 06/06/2022    [provider]  ipratropium-albuterol (DUONEB) 0.5-2.5 (3) MG/3ML SOLN Take 3 mLs by nebulization every 4 (four) hours as needed. 03/06/21   Kathlen Mody, MD  linagliptin (TRADJENTA) 5 MG TABS tablet Take 5 mg by mouth every morning.    [provider]  metFORMIN (GLUCOPHAGE-XR) 750 MG 24 hr tablet Take 750 mg by mouth 2 (two) times daily.    [provider]  metoprolol tartrate (LOPRESSOR) 50 MG tablet Take 50 mg by mouth 2 (two) times daily. 11/21/19   [provider]  montelukast (SINGULAIR) 10 MG tablet Take 10 mg by mouth at bedtime. 01/09/21   [provider]  Multiple Vitamin (MULTIVITAMIN WITH MINERALS) TABS tablet Take 1 tablet by mouth daily. 06/12/22   Arnetha Courser, MD  predniSONE (STERAPRED UNI-PAK 21 TAB) 10 MG (21) TBPK tablet Start tomorrow with 4 tablets, decrease 1 tablet every 2 days until you finish 06/11/22   Arnetha Courser, MD  rosuvastatin (CRESTOR) 40 MG tablet Take 40 mg by mouth every morning. 08/05/19   [provider]  sildenafil (REVATIO) 20 MG tablet Take 20-100 mg by mouth as needed.    [provider]  thiamine (VITAMIN B-1) 100 MG tablet Take 1 tablet (100 mg total) by mouth daily. 06/12/22   Arnetha Courser, MD  TRELEGY ELLIPTA 100-62.5-25 MCG/ACT AEPB Inhale 1 puff into the lungs daily. Patient not taking: Reported on 06/06/2022 03/13/22   [provider]    Physical Exam: Vitals:   09/06/22 1826 09/06/22 1900 09/06/22 2100 09/06/22 2230  BP:  118/67 107/64 (!) 101/52  Pulse:  100 (!) 105 (!) 109  Resp:  (!) 23 (!) 22 (!) 27  Temp:      TempSrc:      SpO2:      Weight: 68  kg     Height: 5\' 7"  (1.702 m)      Physical Exam Vitals and nursing note reviewed.  Constitutional:      General: He is not in acute distress.    Comments: Conversational dyspnea  HENT:     Head: Normocephalic and atraumatic.  Cardiovascular:     Rate and Rhythm: Regular rhythm. Tachycardia present.     Heart sounds: Normal heart sounds.  Pulmonary:     Effort: Tachypnea present.     Breath sounds: Decreased air movement present. Wheezing present.  Abdominal:  Palpations: Abdomen is soft.     Tenderness: There is no abdominal tenderness.  Neurological:     Mental Status: Mental status is at baseline.     Labs on Admission: I have personally reviewed following labs and imaging studies  CBC: Recent Labs  Lab 09/06/22 1840  WBC 14.9*  NEUTROABS 12.0*  HGB 14.7  HCT 45.8  MCV 88.2  PLT 274   Basic Metabolic Panel: Recent Labs  Lab 09/06/22 1840  NA 137  K 4.1  CL 103  CO2 20*  GLUCOSE 205*  BUN 25*  CREATININE 1.40*  CALCIUM 9.2   GFR: Estimated Creatinine Clearance: 44.6 mL/min (A) (by C-G formula based on SCr of 1.4 mg/dL (H)). Liver Function Tests: Recent Labs  Lab 09/06/22 1840  AST 25  ALT 29  ALKPHOS 78  BILITOT 0.6  PROT 6.8  ALBUMIN 3.7   No results for input(s): "LIPASE", "AMYLASE" in the last 168 hours. No results for input(s): "AMMONIA" in the last 168 hours. Coagulation Profile: No results for input(s): "INR", "PROTIME" in the last 168 hours. Cardiac Enzymes: No results for input(s): "CKTOTAL", "CKMB", "CKMBINDEX", "TROPONINI" in the last 168 hours. BNP (last 3 results) No results for input(s): "PROBNP" in the last 8760 hours. HbA1C: No results for input(s): "HGBA1C" in the last 72 hours. CBG: No results for input(s): "GLUCAP" in the last 168 hours. Lipid Profile: No results for input(s): "CHOL", "HDL", "LDLCALC", "TRIG", "CHOLHDL", "LDLDIRECT" in the last 72 hours. Thyroid Function Tests: No results for input(s): "TSH",  "T4TOTAL", "FREET4", "T3FREE", "THYROIDAB" in the last 72 hours. Anemia Panel: No results for input(s): "VITAMINB12", "FOLATE", "FERRITIN", "TIBC", "IRON", "RETICCTPCT" in the last 72 hours. Urine analysis:    Component Value Date/Time   COLORURINE YELLOW (A) 03/01/2021 1938   APPEARANCEUR CLOUDY (A) 03/01/2021 1938   LABSPEC 1.016 03/01/2021 1938   PHURINE 5.0 03/01/2021 1938   GLUCOSEU >=500 (A) 03/01/2021 1938   HGBUR SMALL (A) 03/01/2021 1938   BILIRUBINUR NEGATIVE 03/01/2021 1938   KETONESUR NEGATIVE 03/01/2021 1938   PROTEINUR 100 (A) 03/01/2021 1938   NITRITE NEGATIVE 03/01/2021 1938   LEUKOCYTESUR NEGATIVE 03/01/2021 1938    Radiological Exams on Admission: DG Chest Port 1 View  Result Date: 09/06/2022 CLINICAL DATA:  Shortness of breath EXAM: PORTABLE CHEST 1 VIEW COMPARISON:  06/06/2022 FINDINGS: Cardiac shadow is within normal limits. Aortic calcifications are noted. Lungs are well aerated bilaterally. Chronic emphysematous changes are seen. Chronic interstitial thickening is noted. Stable blunting of the right costophrenic angle is noted. IMPRESSION: Chronic changes without acute abnormality. Electronically Signed   By: Alcide Clever M.D.   On: 09/06/2022 18:39     Data Reviewed: Relevant notes from primary care and specialist visits, past discharge summaries as available in EHR, including Care Everywhere. Prior diagnostic testing as pertinent to current admission diagnoses Updated medications and problem lists for reconciliation ED course, including vitals, labs, imaging, treatment and response to treatment Triage notes, nursing and pharmacy notes and ED provider's notes Notable results as noted in HPI   Assessment and Plan: COPD exacerbation (HCC) Non-small cell carcinoma of the lung Acute on chronic respiratory failure with hypoxia Patient presents with acute respiratory distress and wheezing requiring 6 to 7 L to maintain sats in the low to mid 90s Patient reports  wheezing typical for his COPD For now scheduled and as needed nebulized bronchodilator treatment IV steroids IV Rocephin Supplemental O2 to keep sats over 90  Uncontrolled type 2 diabetes mellitus with hyperglycemia, without  long-term current use of insulin (HCC) Blood sugar in the 200s Continue Tradjenta.  Hold metformin Sliding scale insulin coverage  Essential hypertension Blood pressure somewhat soft.  Will start metoprolol from in the a.m.  Chronic kidney disease, stage 3a (HCC) Renal function at baseline  CAD (coronary artery disease) PAD No complaints of chest pain and EKG nonacute.  Troponin 13 Continue metoprolol, rosuvastatin and aspirin  Anxiety and depression Continue Ativan as needed    DVT prophylaxis: Lovenox  Consults: none  Advance Care Planning:   Code Status: Prior   Family Communication: none  Disposition Plan: Back to previous home environment  Severity of Illness: The appropriate patient status for this patient is INPATIENT. Inpatient status is judged to be reasonable and necessary in order to provide the required intensity of service to ensure the patient's safety. The patient's presenting symptoms, physical exam findings, and initial radiographic and laboratory data in the context of their chronic comorbidities is felt to place them at high risk for further clinical deterioration. Furthermore, it is not anticipated that the patient will be medically stable for discharge from the hospital within 2 midnights of admission.   * I certify that at the point of admission it is my clinical judgment that the patient will require inpatient hospital care spanning beyond 2 midnights from the point of admission due to high intensity of service, high risk for further deterioration and high frequency of surveillance required.*  Author: Andris Baumann, MD 09/06/2022 10:52 PM  For on call review [email or web address].

## 2022-09-06 NOTE — ED Provider Notes (Signed)
[facility] Provider Note    Event Date/Time   First MD Initiated Contact with Patient 09/06/22 1806     (approximate)   History   Shortness of Breath   HPI  Bradley [name] is a 72 y.o. male with a history of COPD on 6-8 L O2 at home, non-small cell lung cancer, hypertension, hyperlipidemia, diabetes, chronic kidney disease, peripheral vascular disease and CAD, currently on hospice care, who presents with increased shortness of breath over the last few weeks, acutely worsened today and associated with nonproductive cough.  The patient denies any chest pain.  He has no vomiting or diarrhea.  Denies any new or worsening leg swelling.  I reviewed the past medical records.  The patient was admitted to hospital service in April for COPD exacerbation and acute on chronic respiratory failure.   Physical Exam   Triage Vital Signs: ED Triage Vitals [09/06/22 1812]  Enc Vitals Group     BP      Pulse      Resp      Temp      Temp src      SpO2 95 %     Weight      Height      Head Circumference      Peak Flow      Pain Score      Pain Loc      Pain Edu?      Excl. in GC?     Most recent vital signs: Vitals:   09/06/22 2300 09/06/22 2354  BP: 112/63 137/83  Pulse: (!) 115 (!) 127  Resp: 20   Temp:  97.8 F (36.6 C)  SpO2:  94%     General: Awake, no distress.  CV:  Good peripheral perfusion.  Normal heart sounds. Resp:  Slightly increased effort.  Diminished breath sounds bilaterally.  No significant wheezes or rales. Abd:  No distention.  Other:  Trace bilateral lower extremity edema.   ED Results / Procedures / Treatments   Labs (all labs ordered are listed, but only abnormal results are displayed) Labs Reviewed  COMPREHENSIVE METABOLIC PANEL - Abnormal; Notable for the following components:      Result Value   CO2 20 (*)    Glucose, Bld 205 (*)    BUN 25 (*)    Creatinine, Ser 1.40 (*)    GFR, Estimated 53 (*)    All other  components within normal limits  CBC WITH DIFFERENTIAL/PLATELET - Abnormal; Notable for the following components:   WBC 14.9 (*)    RDW 15.6 (*)    Neutro Abs 12.0 (*)    Monocytes Absolute 1.2 (*)    Abs Immature Granulocytes 0.17 (*)    All other components within normal limits  BRAIN NATRIURETIC PEPTIDE - Abnormal; Notable for the following components:   B Natriuretic Peptide 330.1 (*)    All other components within normal limits  BLOOD GAS, VENOUS - Abnormal; Notable for the following components:   pH, Ven 7.46 (*)    pCO2, Ven 36 (*)    All other components within normal limits  D-DIMER, QUANTITATIVE - Abnormal; Notable for the following components:   D-Dimer, Quant 0.89 (*)    All other components within normal limits  GLUCOSE, CAPILLARY - Abnormal; Notable for the following components:   Glucose-Capillary 378 (*)    All other components within normal limits  TROPONIN I (HIGH SENSITIVITY)     EKG  ED ECG  REPORT I, Dionne Bucy, the attending physician, personally viewed and interpreted this ECG.  Date: 09/06/2022 EKG Time: 1812 Rate: 106 Rhythm: Sinus tachycardia QRS Axis: normal Intervals: RV BP ST/T Wave abnormalities: nonspecific T wave abnormalities  Narrative Interpretation: no evidence of acute ischemia    RADIOLOGY  Chest x-ray: I independently viewed and interpreted the images; there are chronic findings but no focal consolidation or edema   PROCEDURES:  Critical Care performed: No  Procedures   MEDICATIONS ORDERED IN ED: Medications  aspirin EC tablet 81 mg (has no administration in time range)  metoprolol tartrate (LOPRESSOR) tablet 50 mg (has no administration in time range)  rosuvastatin (CRESTOR) tablet 40 mg (has no administration in time range)  LORazepam (ATIVAN) tablet 0.5 mg (has no administration in time range)  linagliptin (TRADJENTA) tablet 5 mg (has no administration in time range)  famotidine (PEPCID) tablet 20 mg (has no  administration in time range)  enoxaparin (LOVENOX) injection 40 mg (has no administration in time range)  acetaminophen (TYLENOL) tablet 650 mg (has no administration in time range)    Or  acetaminophen (TYLENOL) suppository 650 mg (has no administration in time range)  ondansetron (ZOFRAN) tablet 4 mg (has no administration in time range)    Or  ondansetron (ZOFRAN) injection 4 mg (has no administration in time range)  cefTRIAXone (ROCEPHIN) 1 g in sodium chloride 0.9 % 100 mL IVPB (has no administration in time range)  methylPREDNISolone sodium succinate (SOLU-MEDROL) 40 mg/mL injection 40 mg (has no administration in time range)    Followed by  predniSONE (DELTASONE) tablet 40 mg (has no administration in time range)  ipratropium-albuterol (DUONEB) 0.5-2.5 (3) MG/3ML nebulizer solution 3 mL (has no administration in time range)  albuterol (PROVENTIL) (2.5 MG/3ML) 0.083% nebulizer solution 2.5 mg (has no administration in time range)  insulin aspart (novoLOG) injection 0-20 Units (has no administration in time range)  insulin aspart (novoLOG) injection 0-5 Units (has no administration in time range)  HYDROcodone-acetaminophen (NORCO/VICODIN) 5-325 MG per tablet 1-2 tablet (has no administration in time range)  guaiFENesin (MUCINEX) 12 hr tablet 600 mg (has no administration in time range)  sodium chloride 0.9 % bolus 1,000 mL (has no administration in time range)  albuterol (PROVENTIL) (2.5 MG/3ML) 0.083% nebulizer solution 2.5 mg (2.5 mg Nebulization Given 09/06/22 1842)  albuterol (PROVENTIL) (2.5 MG/3ML) 0.083% nebulizer solution 2.5 mg (2.5 mg Nebulization Given 09/06/22 1841)  azithromycin (ZITHROMAX) 500 mg in sodium chloride 0.9 % 250 mL IVPB (500 mg Intravenous New Bag/Given 09/06/22 2231)  albuterol (PROVENTIL) (2.5 MG/3ML) 0.083% nebulizer solution 2.5 mg (2.5 mg Nebulization Given 09/06/22 2233)     IMPRESSION / MDM / ASSESSMENT AND PLAN / ED COURSE  I reviewed the triage vital signs  and the nursing notes.  72 year old male with PMH as noted above including COPD on 6-8 L home O2, currently on hospice care, presents with worsening shortness of breath over the last several days.  On exam the patient demonstrates increased work of breathing but is speaking in full sentences and not in acute distress.  O2 saturation is in the low 90s on 6 L.  The patient received Solu-Medrol and bronchodilators from EMS.  Differential diagnosis includes, but is not limited to, COPD exacerbation, acute on and likely see, pneumonia, other infection, less likely pulmonary edema or fibrosis.  We will obtain chest x-ray, lab workup, give additional bronchodilators, and reassess.  The patient is in agreement with the plan.  He is DNR/DNI.  Patient's presentation  is most consistent with severe exacerbation of chronic illness.  The patient is on the cardiac monitor to evaluate for evidence of arrhythmia and/or significant heart rate changes.   ----------------------------------------- 11:30 PM on 09/06/2022 -----------------------------------------   Chest x-ray no acute findings.  Lab workup is overall reassuring.  The patient has mild leukocytosis and no significant elevation in his BNP.  Troponin is negative.  He is currently on 7 L O2 by nasal cannula which is his baseline.  However the patient still reports a sensation of a rattle in his chest, and increased shortness of breath from baseline.  Therefore I will admit him for acute on chronic respiratory failure and likely COPD exacerbation..  The patient confirms that he is DNR/DNI.  I consulted Dr. Para March from the hospitalist service; based on our discussion she agrees to evaluate the patient for admission.  FINAL CLINICAL IMPRESSION(S) / ED DIAGNOSES   Final diagnoses:  Acute on chronic respiratory failure with hypoxia (HCC)     Rx / DC Orders   ED Discharge Orders     None        Note:  This document was prepared using Dragon  voice recognition software and may include unintentional dictation errors.    Dionne Bucy, MD 09/07/22 [phone]

## 2022-09-06 NOTE — Assessment & Plan Note (Signed)
Will decrease dose of metoprolol

## 2022-09-06 NOTE — Assessment & Plan Note (Signed)
-   Continue Ativan as needed 

## 2022-09-06 NOTE — Assessment & Plan Note (Addendum)
Non-small cell carcinoma of the lung Acute on chronic respiratory failure with hypoxia Patient states that he uses a baseline oxygen of 6 to 8 L at home. This morning was on 6 L high flow nasal cannula. Continue IV Solu-Medrol today and reassess tomorrow.  Continue nebulizer treatments.  On empiric Rocephin.  CT scan of the chest negative for PE.  Added Roxanol as needed for air hunger and/or pain.

## 2022-09-06 NOTE — Assessment & Plan Note (Signed)
Renal function at baseline 

## 2022-09-06 NOTE — Assessment & Plan Note (Signed)
PAD No complaints of chest pain and EKG nonacute.  Troponin 13 Continue metoprolol, rosuvastatin and aspirin

## 2022-09-06 NOTE — ED Triage Notes (Signed)
Patient complaining of worsening shortness of breath for two weeks. Patient has a Hx of Stage 4 COPD and lung cancer. Patient is normally on 6L [place] at home. Patient currently lives at home but is receiving care from Hospice. Had to d/c Hospice in order to be seen in the hospital today.

## 2022-09-07 DIAGNOSIS — E1165 Type 2 diabetes mellitus with hyperglycemia: Secondary | ICD-10-CM

## 2022-09-07 DIAGNOSIS — C3490 Malignant neoplasm of unspecified part of unspecified bronchus or lung: Secondary | ICD-10-CM

## 2022-09-07 DIAGNOSIS — F419 Anxiety disorder, unspecified: Secondary | ICD-10-CM

## 2022-09-07 DIAGNOSIS — J9621 Acute and chronic respiratory failure with hypoxia: Secondary | ICD-10-CM | POA: Diagnosis not present

## 2022-09-07 DIAGNOSIS — I2510 Atherosclerotic heart disease of native coronary artery without angina pectoris: Secondary | ICD-10-CM

## 2022-09-07 DIAGNOSIS — J441 Chronic obstructive pulmonary disease with (acute) exacerbation: Secondary | ICD-10-CM | POA: Diagnosis not present

## 2022-09-07 DIAGNOSIS — N1831 Chronic kidney disease, stage 3a: Secondary | ICD-10-CM

## 2022-09-07 DIAGNOSIS — F32A Depression, unspecified: Secondary | ICD-10-CM

## 2022-09-07 DIAGNOSIS — I10 Essential (primary) hypertension: Secondary | ICD-10-CM

## 2022-09-07 LAB — GLUCOSE, CAPILLARY
Glucose-Capillary: 193 mg/dL — ABNORMAL HIGH (ref 70–99)
Glucose-Capillary: 216 mg/dL — ABNORMAL HIGH (ref 70–99)
Glucose-Capillary: 282 mg/dL — ABNORMAL HIGH (ref 70–99)
Glucose-Capillary: 310 mg/dL — ABNORMAL HIGH (ref 70–99)
Glucose-Capillary: 378 mg/dL — ABNORMAL HIGH (ref 70–99)

## 2022-09-07 MED ORDER — METHYLPREDNISOLONE SODIUM SUCC 125 MG IJ SOLR
80.0000 mg | Freq: Every day | INTRAMUSCULAR | Status: DC
  Administered 2022-09-07 – 2022-09-13 (×7): 80 mg via INTRAVENOUS
  Filled 2022-09-07 (×7): qty 2

## 2022-09-07 MED ORDER — FOLIC ACID 1 MG PO TABS
1.0000 mg | ORAL_TABLET | Freq: Every day | ORAL | Status: DC
  Administered 2022-09-07 – 2022-09-13 (×7): 1 mg via ORAL
  Filled 2022-09-07 (×7): qty 1

## 2022-09-07 MED ORDER — INSULIN GLARGINE-YFGN 100 UNIT/ML ~~LOC~~ SOLN
15.0000 [IU] | Freq: Every day | SUBCUTANEOUS | Status: DC
  Administered 2022-09-07 – 2022-09-12 (×6): 15 [IU] via SUBCUTANEOUS
  Filled 2022-09-07 (×7): qty 0.15

## 2022-09-07 MED ORDER — FLUTICASONE PROPIONATE 50 MCG/ACT NA SUSP
1.0000 | Freq: Every day | NASAL | Status: DC | PRN
  Administered 2022-09-08 – 2022-09-13 (×6): 1 via NASAL
  Filled 2022-09-07: qty 16

## 2022-09-07 MED ORDER — UMECLIDINIUM BROMIDE 62.5 MCG/ACT IN AEPB
1.0000 | INHALATION_SPRAY | Freq: Every day | RESPIRATORY_TRACT | Status: DC
  Administered 2022-09-07 – 2022-09-13 (×7): 1 via RESPIRATORY_TRACT
  Filled 2022-09-07 (×2): qty 7

## 2022-09-07 MED ORDER — METOPROLOL TARTRATE 25 MG PO TABS
25.0000 mg | ORAL_TABLET | Freq: Two times a day (BID) | ORAL | Status: DC
  Administered 2022-09-07 – 2022-09-13 (×11): 25 mg via ORAL
  Filled 2022-09-07 (×13): qty 1

## 2022-09-07 MED ORDER — FLUTICASONE FUROATE-VILANTEROL 100-25 MCG/ACT IN AEPB
1.0000 | INHALATION_SPRAY | Freq: Every day | RESPIRATORY_TRACT | Status: DC
  Administered 2022-09-07 – 2022-09-13 (×7): 1 via RESPIRATORY_TRACT
  Filled 2022-09-07: qty 28

## 2022-09-07 NOTE — Progress Notes (Signed)
Progress Note   Patient: Bradley [name] YNW:[id number] DOB: Dec 06, 1950 DOA: 09/06/2022     1 DOS: the patient was seen and examined on 09/07/2022   Brief hospital course: 72 y.o. male with medical history significant for HTN, DM, PAD, CAD, CKD 3a, depression and anxiety,NSCLC (s/p of XRT), COPD on 6 L O2 last hospitalized in April 2024 with COPD requiring BiPAP, discharged to hospice, who presents to the ED with a several day history of progressively worsening wheezing and shortness of breath unrelieved with home treatments, acutely worsening on the day of arrival.  .He arrived by EMS who administered DuoNebs and Solu-Medrol en route.  He has a nonproductive cough and no fever and chills no chest pain.  Denies lower extremity pain or swelling. ED course And data review: Tachycardic to 106, tachypneic to 23 but afebrile and normotensive O2 sats low to mid 90s on 6 L. VBG with pH 7.46 and pCO2 36.  Troponin 13 and BNP 330.  WBC 14.9.  Creatinine at baseline at 1.4.  Blood glucose elevated at 205. EKG, personally viewed and interpreted showing sinus tachycardia with RBBB Chest x-ray showed chronic changes without acute abnormality.  7/6.  Patient still feeling short of breath.  Continue IV Solu-Medrol.  Patient on high flow nasal cannula.  Assessment and Plan: COPD exacerbation (HCC) Non-small cell carcinoma of the lung Acute on chronic respiratory failure with hypoxia Patient states that he uses a baseline oxygen of 6 to 8 L at home. Currently on 7 L high flow nasal cannula this morning. Continue IV Solu-Medrol today and reassess tomorrow.  Continue nebulizer treatments.  On empiric Rocephin.  Uncontrolled type 2 diabetes mellitus with hyperglycemia, without long-term current use of insulin (HCC) Continue Tradjenta.  Hold metformin Sliding scale insulin coverage.  Add Semglee insulin while on steroids. Last hemoglobin A1c 7.5 back in January.  Essential hypertension Will decrease dose of  metoprolol  Chronic kidney disease, stage 3a (HCC) Creatinine 1.4 with a GFR 53.  CAD (coronary artery disease) PAD Continue metoprolol, rosuvastatin and aspirin  Anxiety and depression Continue Ativan as needed        Subjective: Patient feels a little bit better today than yesterday.  Came in because he could not breathe.  Coughing up some clear phlegm.  No fever.  Admitted with COPD exacerbation.  Chronically wears 6 to 8 L of oxygen at home.  Currently on high flow nasal cannula 7 L.  Physical Exam: Vitals:   09/07/22 0430 09/07/22 0735 09/07/22 0842 09/07/22 1218  BP:   (!) 110/57 110/65  Pulse:   (!) 103 95  Resp:   16 16  Temp:   97.7 F (36.5 C) 97.6 F (36.4 C)  TempSrc:   Oral Oral  SpO2: 95% 91% 94% 96%  Weight:      Height:       Physical Exam HENT:     Head: Normocephalic.     Mouth/Throat:     Pharynx: No oropharyngeal exudate.  Eyes:     General: Lids are normal.     Conjunctiva/sclera: Conjunctivae normal.  Cardiovascular:     Rate and Rhythm: Normal rate and regular rhythm.     Heart sounds: Normal heart sounds, S1 normal and S2 normal.  Pulmonary:     Breath sounds: Examination of the right-middle field reveals decreased breath sounds. Examination of the left-middle field reveals decreased breath sounds. Examination of the right-lower field reveals decreased breath sounds. Examination of the left-lower field reveals decreased  breath sounds. Decreased breath sounds present. No wheezing, rhonchi or rales.  Abdominal:     Palpations: Abdomen is soft.     Tenderness: There is no abdominal tenderness.  Musculoskeletal:     Right lower leg: No swelling.     Left lower leg: No swelling.  Skin:    General: Skin is warm.     Findings: No rash.  Neurological:     Mental Status: He is alert and oriented to person, place, and time.     Data Reviewed: BNP 330, creatinine 1.40, pCO2 on blood gas 36  Family Communication: Updated patient's daughter on  the phone  Disposition: Status is: Inpatient Remains inpatient appropriate because: Patient currently on high flow nasal cannula.  Normally wears 6 to 8 L at home as per patient.  Planned Discharge Destination: Home    Time spent: 28 minutes  Author: Alford Highland, MD 09/07/2022 12:44 PM  For on call review [email or web address].

## 2022-09-07 NOTE — Hospital Course (Addendum)
72 y.o. male with medical history significant for HTN, DM, PAD, CAD, CKD 3a, depression and anxiety,NSCLC (s/p of XRT), COPD on 6 L O2 last hospitalized in April 2024 with COPD requiring BiPAP, discharged to hospice, who presents to the ED with a several day history of progressively worsening wheezing and shortness of breath unrelieved with home treatments, acutely worsening on the day of arrival.  .He arrived by EMS who administered DuoNebs and Solu-Medrol en route.  He has a nonproductive cough and no fever and chills no chest pain.  Denies lower extremity pain or swelling. ED course And data review: Tachycardic to 106, tachypneic to 23 but afebrile and normotensive O2 sats low to mid 90s on 6 L. VBG with pH 7.46 and pCO2 36.  Troponin 13 and BNP 330.  WBC 14.9.  Creatinine at baseline at 1.4.  Blood glucose elevated at 205. EKG, personally viewed and interpreted showing sinus tachycardia with RBBB Chest x-ray showed chronic changes without acute abnormality.  7/6.  Patient still feeling short of breath.  Continue IV Solu-Medrol.  Patient on high flow nasal cannula. 7/7.  Patient breathing a little bit harder and quicker this morning.  Complains of shortness of breath.  CT scan of the chest negative for pulmonary embolism but did show severe emphysema in the upper lobes and also masslike consolidation posterior to the right bronchus intermedius measuring slightly larger. 7/8.  Feels a little bit better today than yesterday.  Still short of breath with minimal activity. 7/9.  Patient agreed that he is not the best candidate for a tissue biopsy and or systemic chemotherapy.  I mentioned that we can give Roxanol as needed for air hunger or shortness of breath.  Patient's daughter interested in resetting up hospice.

## 2022-09-07 NOTE — Evaluation (Signed)
Physical Therapy Evaluation Patient Details Name: HASSANI LAMBROU MRN: [id number] DOB: 1951/01/06 Today's Date: 09/07/2022  History of Present Illness  Harvin Mccully is a 72 y/o male with PMH of HTN, depression/anxiety, CKD3, PVD, CAD, COPD and lung cancer. Admitted with COPD exacerbation (one day worsening shortness of breath, cough, sputum).  Clinical Impression  Pt is a pleasant 72 year old male who was admitted for COPD exacerbation. Pt performs bed mobility independent with HOB elevated, transfers mod I, and ambulation independent. Pt on 7L of O2 and spo2 87 upon entering room. Transferred to sitting EOB with desaturation to 82%, educated on PLB to increase Spo2. Increased O2 to 8L due to pt not rebounding. Sit<>stand mod I  with use of IV pole, stood for 1 minute no UE support. Ambulated 45ft to chair with assistance for IV pole. O2 desaturation to 85% HR 119 on 8L. Pt unable to rebound with PLB while resting, O2 increased to 9L spo2 91%. Patient left in chair with call bell and alarm set. Pt demonstrates deficits with endurance and mobility that are consistent with their baseline. Pt is currently performing at baseline prior to admission PT will sign off and place recommendation to mobility services for duration of stay.      Assistance Recommended at Discharge Set up Supervision/Assistance  If plan is discharge home, recommend the following:  Can travel by private vehicle  A little help with walking and/or transfers;Assist for transportation        Equipment Recommendations None recommended by PT  Recommendations for Other Services       Functional Status Assessment Patient has not had a recent decline in their functional status     Precautions / Restrictions Precautions Precautions: Fall Restrictions Weight Bearing Restrictions: No      Mobility  Bed Mobility Overal bed mobility: Needs Assistance Bed Mobility: Supine to Sit     Supine to sit: Independent, HOB elevated      General bed mobility comments: IND to sit EOB with HOB elevated    Transfers Overall transfer level: Needs assistance Equipment used:  (IV pole) Transfers: Sit to/from Stand Sit to Stand: Modified independent (Device/Increase time)                Ambulation/Gait Ambulation/Gait assistance: Independent Gait Distance (Feet): 3 Feet Assistive device: IV Pole Gait Pattern/deviations: WFL(Within Functional Limits), Step-through pattern, Trunk flexed       General Gait Details: step through pattern and increased trunk flexion  Stairs            Wheelchair Mobility     Tilt Bed    Modified Rankin (Stroke Patients Only)       Balance Overall balance assessment: Needs assistance Sitting-balance support: No upper extremity supported, Feet supported Sitting balance-Leahy Scale: Normal     Standing balance support: No upper extremity supported, During functional activity Standing balance-Leahy Scale: Normal                               Pertinent Vitals/Pain Pain Assessment Pain Assessment: No/denies pain    Home Living Family/patient expects to be discharged to:: Private residence Living Arrangements: Alone Available Help at Discharge: Family Type of Home: Other(Comment) Home Access: Stairs to enter Entrance Stairs-Rails: Left Entrance Stairs-Number of Steps: 2-3   Home Layout: One level Home Equipment: Wheelchair - manual;Cane - single point Additional Comments: Pt states he uses oxygen at home 6-8L;    Prior  Function Prior Level of Function : Needs assist;Driving       Physical Assist : ADLs (physical)   ADLs (physical): IADLs Mobility Comments: Cannot walk all the way across his home without stopping for rest breaks due to poor respiratory status. Does check O2 at home and sometimes it gets into low 80's or even 70's. ADLs Comments: MOD (I) for ADLs, although takes a very long time 2/2 poor respiratory status; has to go very slowly  and take lots of rest breaks.     Hand Dominance   Dominant Hand: Right    Extremity/Trunk Assessment   Upper Extremity Assessment Upper Extremity Assessment: Overall WFL for tasks assessed    Lower Extremity Assessment Lower Extremity Assessment: Overall WFL for tasks assessed       Communication   Communication: No difficulties  Cognition Arousal/Alertness: Awake/alert Behavior During Therapy: WFL for tasks assessed/performed Overall Cognitive Status: Within Functional Limits for tasks assessed                                          General Comments      Exercises     Assessment/Plan    PT Assessment Patient does not need any further PT services  PT Problem List         PT Treatment Interventions      PT Goals (Current goals can be found in the Care Plan section)  Acute Rehab PT Goals Patient Stated Goal: to go home PT Goal Formulation: With patient Time For Goal Achievement: 09/21/22 Potential to Achieve Goals: Good    Frequency       Co-evaluation               AM-PAC PT "6 Clicks" Mobility  Outcome Measure Help needed turning from your back to your side while in a flat bed without using bedrails?: None Help needed moving from lying on your back to sitting on the side of a flat bed without using bedrails?: None Help needed moving to and from a bed to a chair (including a wheelchair)?: None Help needed standing up from a chair using your arms (e.g., wheelchair or bedside chair)?: None Help needed to walk in hospital room?: A Little Help needed climbing 3-5 steps with a railing? : A Little 6 Click Score: 22    End of Session Equipment Utilized During Treatment: Gait belt;Oxygen Activity Tolerance: Patient tolerated treatment well;Treatment limited secondary to medical complications (Comment) (desaturates w/ activity.) Patient left: with call bell/phone within reach;in chair;with chair alarm set Nurse Communication:  Mobility status PT Visit Diagnosis: Unsteadiness on feet (R26.81);Difficulty in walking, not elsewhere classified (R26.2)    Time: 1610-9604 PT Time Calculation (min) (ACUTE ONLY): 25 min   Charges:                 Malachi Carl, SPT   Malachi Carl 09/07/2022, 12:59 PM

## 2022-09-08 ENCOUNTER — Encounter: Payer: Self-pay | Admitting: Internal Medicine

## 2022-09-08 ENCOUNTER — Inpatient Hospital Stay: Payer: Medicare Other

## 2022-09-08 DIAGNOSIS — J441 Chronic obstructive pulmonary disease with (acute) exacerbation: Secondary | ICD-10-CM | POA: Diagnosis not present

## 2022-09-08 DIAGNOSIS — C3492 Malignant neoplasm of unspecified part of left bronchus or lung: Secondary | ICD-10-CM

## 2022-09-08 DIAGNOSIS — E1165 Type 2 diabetes mellitus with hyperglycemia: Secondary | ICD-10-CM | POA: Diagnosis not present

## 2022-09-08 DIAGNOSIS — K5900 Constipation, unspecified: Secondary | ICD-10-CM | POA: Insufficient documentation

## 2022-09-08 DIAGNOSIS — K5909 Other constipation: Secondary | ICD-10-CM

## 2022-09-08 DIAGNOSIS — J9621 Acute and chronic respiratory failure with hypoxia: Secondary | ICD-10-CM | POA: Diagnosis not present

## 2022-09-08 LAB — GLUCOSE, CAPILLARY
Glucose-Capillary: 145 mg/dL — ABNORMAL HIGH (ref 70–99)
Glucose-Capillary: 226 mg/dL — ABNORMAL HIGH (ref 70–99)
Glucose-Capillary: 143 mg/dL — ABNORMAL HIGH (ref 70–99)
Glucose-Capillary: 203 mg/dL — ABNORMAL HIGH (ref 70–99)

## 2022-09-08 MED ORDER — IOHEXOL 350 MG/ML SOLN
75.0000 mL | Freq: Once | INTRAVENOUS | Status: AC | PRN
  Administered 2022-09-08: 75 mL via INTRAVENOUS

## 2022-09-08 MED ORDER — IPRATROPIUM-ALBUTEROL 0.5-2.5 (3) MG/3ML IN SOLN
3.0000 mL | Freq: Three times a day (TID) | RESPIRATORY_TRACT | Status: DC
  Administered 2022-09-08 – 2022-09-13 (×16): 3 mL via RESPIRATORY_TRACT
  Filled 2022-09-08 (×14): qty 3

## 2022-09-08 MED ORDER — POLYETHYLENE GLYCOL 3350 17 G PO PACK
17.0000 g | PACK | Freq: Every day | ORAL | Status: DC
  Administered 2022-09-08 – 2022-09-12 (×3): 17 g via ORAL
  Filled 2022-09-08 (×4): qty 1

## 2022-09-08 NOTE — Progress Notes (Signed)
Progress Note   Patient: Bradley [name] ZOX:[id number] DOB: 1950/11/18 DOA: 09/06/2022     2 DOS: the patient was seen and examined on 09/08/2022   Brief hospital course: 72 y.o. male with medical history significant for HTN, DM, PAD, CAD, CKD 3a, depression and anxiety,NSCLC (s/p of XRT), COPD on 6 L O2 last hospitalized in April 2024 with COPD requiring BiPAP, discharged to hospice, who presents to the ED with a several day history of progressively worsening wheezing and shortness of breath unrelieved with home treatments, acutely worsening on the day of arrival.  .He arrived by EMS who administered DuoNebs and Solu-Medrol en route.  He has a nonproductive cough and no fever and chills no chest pain.  Denies lower extremity pain or swelling. ED course And data review: Tachycardic to 106, tachypneic to 23 but afebrile and normotensive O2 sats low to mid 90s on 6 L. VBG with pH 7.46 and pCO2 36.  Troponin 13 and BNP 330.  WBC 14.9.  Creatinine at baseline at 1.4.  Blood glucose elevated at 205. EKG, personally viewed and interpreted showing sinus tachycardia with RBBB Chest x-ray showed chronic changes without acute abnormality.  7/6.  Patient still feeling short of breath.  Continue IV Solu-Medrol.  Patient on high flow nasal cannula. 7/7.  Patient breathing a little bit harder and quicker this morning.  Complains of shortness of breath.  CT scan of the chest negative for pulmonary embolism but did show severe emphysema in the upper lobes and also masslike consolidation posterior to the right bronchus intermedius measuring slightly larger.  Assessment and Plan: COPD exacerbation (HCC) Non-small cell carcinoma of the lung Acute on chronic respiratory failure with hypoxia Patient states that he uses a baseline oxygen of 6 to 8 L at home. Currently on 7 L high flow nasal cannula this morning. Continue IV Solu-Medrol today and reassess tomorrow.  Continue nebulizer treatments.  On empiric Rocephin.  CT  scan of the chest negative for PE.  Non-small cell lung cancer (NSCLC) (HCC) CT scan of the chest showing slightly larger mass.  Patient had received radiation in the past.  Functionally probably not the best candidate for chemotherapy.  Potentially will need an outpatient PET/CT scan and oncology referral.  Uncontrolled type 2 diabetes mellitus with hyperglycemia, without long-term current use of insulin (HCC) Continue Tradjenta.  Hold metformin Sliding scale insulin coverage.  Add Semglee insulin while on steroids. Last hemoglobin A1c 7.5 back in January.  Essential hypertension Continue lower dose metoprolol  Chronic kidney disease, stage 3a (HCC) Creatinine 1.4 with a GFR 53.  CAD (coronary artery disease) PAD Continue metoprolol, rosuvastatin and aspirin  Constipation Add MiraLAX  Anxiety and depression Continue Ativan as needed        Subjective: Patient breathing harder and quicker this morning and more shallow.  Feels short of breath.  Some cough.  Admitted with COPD exacerbation.  Physical Exam: Vitals:   09/08/22 0427 09/08/22 0753 09/08/22 0824 09/08/22 1218  BP: 120/65  120/63 105/61  Pulse: 79 73 85 90  Resp: (!) 21 20 18 16   Temp: (!) 97.4 F (36.3 C)  98.3 F (36.8 C) 97.7 F (36.5 C)  TempSrc:   Oral   SpO2: 94% 94% 93% 95%  Weight:      Height:       Physical Exam HENT:     Head: Normocephalic.     Mouth/Throat:     Pharynx: No oropharyngeal exudate.  Eyes:  General: Lids are normal.     Conjunctiva/sclera: Conjunctivae normal.  Cardiovascular:     Rate and Rhythm: Normal rate and regular rhythm.     Heart sounds: Normal heart sounds, S1 normal and S2 normal.  Pulmonary:     Breath sounds: Examination of the right-middle field reveals decreased breath sounds. Examination of the left-middle field reveals decreased breath sounds. Examination of the right-lower field reveals decreased breath sounds. Examination of the left-lower field  reveals decreased breath sounds. Decreased breath sounds present. No wheezing, rhonchi or rales.  Abdominal:     Palpations: Abdomen is soft.     Tenderness: There is no abdominal tenderness.  Musculoskeletal:     Right lower leg: No swelling.     Left lower leg: No swelling.  Skin:    General: Skin is warm.     Findings: No rash.  Neurological:     Mental Status: He is alert and oriented to person, place, and time.     Data Reviewed: CT scan of the chest reviewed  Family Communication: Updated patient's daughter on the phone  Disposition: Status is: Inpatient Remains inpatient appropriate because: Patient breathing more rapid and shallow today.  Continue IV Solu-Medrol.  CT scan of the chest reviewed  Planned Discharge Destination: Home with Home Health    Time spent: 28 minutes  Author: Alford Highland, MD 09/08/2022 1:03 PM  For on call review [email or web address].

## 2022-09-08 NOTE — Assessment & Plan Note (Addendum)
CT scan of the chest showing slightly larger mass.  Patient had received radiation in the past.  Functionally probably not the best candidate for biopsy and/or chemotherapy.  Patient agreeable to this fact.  Will really set up hospice upon disposition.

## 2022-09-08 NOTE — Assessment & Plan Note (Addendum)
Add MiraLAX 

## 2022-09-09 DIAGNOSIS — J441 Chronic obstructive pulmonary disease with (acute) exacerbation: Secondary | ICD-10-CM

## 2022-09-09 DIAGNOSIS — E1165 Type 2 diabetes mellitus with hyperglycemia: Secondary | ICD-10-CM

## 2022-09-09 DIAGNOSIS — I10 Essential (primary) hypertension: Secondary | ICD-10-CM

## 2022-09-09 DIAGNOSIS — J9621 Acute and chronic respiratory failure with hypoxia: Secondary | ICD-10-CM | POA: Diagnosis not present

## 2022-09-09 DIAGNOSIS — F32A Depression, unspecified: Secondary | ICD-10-CM

## 2022-09-09 DIAGNOSIS — I2510 Atherosclerotic heart disease of native coronary artery without angina pectoris: Secondary | ICD-10-CM

## 2022-09-09 DIAGNOSIS — C3491 Malignant neoplasm of unspecified part of right bronchus or lung: Secondary | ICD-10-CM | POA: Diagnosis not present

## 2022-09-09 DIAGNOSIS — K5909 Other constipation: Secondary | ICD-10-CM

## 2022-09-09 DIAGNOSIS — E440 Moderate protein-calorie malnutrition: Secondary | ICD-10-CM

## 2022-09-09 DIAGNOSIS — N1831 Chronic kidney disease, stage 3a: Secondary | ICD-10-CM

## 2022-09-09 DIAGNOSIS — F419 Anxiety disorder, unspecified: Secondary | ICD-10-CM

## 2022-09-09 LAB — BASIC METABOLIC PANEL
Anion gap: 9 (ref 5–15)
BUN: 31 mg/dL — ABNORMAL HIGH (ref 8–23)
CO2: 26 mmol/L (ref 22–32)
Calcium: 8.5 mg/dL — ABNORMAL LOW (ref 8.9–10.3)
Chloride: 106 mmol/L (ref 98–111)
Creatinine, Ser: 1.11 mg/dL (ref 0.61–1.24)
GFR, Estimated: >60 mL/min (ref >60)
Glucose, Bld: 116 mg/dL — ABNORMAL HIGH (ref 70–99)
Potassium: 3.6 mmol/L (ref 3.5–5.1)
Sodium: 141 mmol/L (ref 135–145)

## 2022-09-09 LAB — CBC
HCT: 40.7 % (ref 39.0–52.0)
Hemoglobin: 13.2 g/dL (ref 13.0–17.0)
MCH: 28.3 pg (ref 26.0–34.0)
MCHC: 32.4 g/dL (ref 30.0–36.0)
MCV: 87.2 fL (ref 80.0–100.0)
Platelets: 221 10*3/uL (ref 150–400)
RBC: 4.67 MIL/uL (ref 4.22–5.81)
RDW: 16.2 % — ABNORMAL HIGH (ref 11.5–15.5)
WBC: 14.1 10*3/uL — ABNORMAL HIGH (ref 4.0–10.5)
nRBC: 0 % (ref 0.0–0.2)

## 2022-09-09 LAB — GLUCOSE, CAPILLARY
Glucose-Capillary: 137 mg/dL — ABNORMAL HIGH (ref 70–99)
Glucose-Capillary: 154 mg/dL — ABNORMAL HIGH (ref 70–99)
Glucose-Capillary: 261 mg/dL — ABNORMAL HIGH (ref 70–99)
Glucose-Capillary: 236 mg/dL — ABNORMAL HIGH (ref 70–99)

## 2022-09-09 MED ORDER — GLUCERNA SHAKE PO LIQD
237.0000 mL | Freq: Three times a day (TID) | ORAL | Status: DC
  Administered 2022-09-09 – 2022-09-13 (×10): 237 mL via ORAL

## 2022-09-09 MED ORDER — ADULT MULTIVITAMIN W/MINERALS CH
1.0000 | ORAL_TABLET | Freq: Every day | ORAL | Status: DC
  Administered 2022-09-09 – 2022-09-13 (×5): 1 via ORAL
  Filled 2022-09-09 (×5): qty 1

## 2022-09-09 MED ORDER — GUAIFENESIN-DM 100-10 MG/5ML PO SYRP
5.0000 mL | ORAL_SOLUTION | ORAL | Status: DC | PRN
  Administered 2022-09-09: 5 mL via ORAL
  Filled 2022-09-09: qty 10

## 2022-09-09 NOTE — Progress Notes (Signed)
Initial Nutrition Assessment  DOCUMENTATION CODES:   Non-severe (moderate) malnutrition in context of chronic illness  INTERVENTION:   -MVI with minerals daily -Liberalize diet to carb modified diet for wider variety of meal selections -Glucerna Shake po TID, each supplement provides 220 kcal and 10 grams of protein   NUTRITION DIAGNOSIS:   Moderate Malnutrition related to chronic illness (COPD) as evidenced by mild fat depletion, mild muscle depletion, percent weight loss.  GOAL:   Patient will meet greater than or equal to 90% of their needs  MONITOR:   PO intake, Supplement acceptance  REASON FOR ASSESSMENT:   Malnutrition Screening Tool    ASSESSMENT:   Pt with medical history significant for HTN, DM, PAD, CAD, CKD 3a, depression and anxiety,NSCLC (s/p of XRT), COPD on 6 L O2 last hospitalized in April 2024 with COPD requiring BiPAP, discharged to hospice, who presents with a several day history of progressively worsening wheezing and shortness of breath unrelieved with home treatments, acutely worsening on the day of admission.  Pt admitted with COPD exacerbation.   Reviewed I/O's: -840 ml x 24 hours and -88 ml since admission  UOP: 1.8 L x 24 hours  Spoke with pt at bedside, who was pleasant and in good spirits today. Pt reports feeling better today. He has a good appetite and consumed 100% of his breakfast this morning. Pt reports good appetite PTA, consumes 2-3 meals per day. Pt shares that he consumes mostly processed foods such as TV dinners and spaghetti o's. [place], but has been thinking about moving in with family members to receive additional support.   Pt denies any weight loss. He reports his UBW is around 160#. Reviewed wt hx; pt has experienced a 13.2% wt loss over the past 3 months, which is significant for time frame.    Discussed importance of good meal and supplement intake to promote healing. Pt amenable to supplements.    Medications reviewed and include folic acid, solu-medrol, and miralax.   Lab Results  Component Value Date   HGBA1C 7.5 (H) 04/02/2022   PTA DM medications are 5 mg tradjenta daily and 750 ml metformin BID.   Labs reviewed: CBGS: 137-154 (inpatient orders for glycemic control are 5 mg linagliptin,  0-20 units insulin aspart TID with meals, 0-5 units insulin aspart daily at bedtime, 15 units insulin glargine-yfgn daily).    NUTRITION - FOCUSED PHYSICAL EXAM:  Flowsheet Row Most Recent Value  Orbital Region Mild depletion  Upper Arm Region No depletion  Thoracic and Lumbar Region No depletion  Buccal Region Mild depletion  Temple Region Mild depletion  Clavicle Bone Region Mild depletion  Clavicle and Acromion Bone Region No depletion  Scapular Bone Region No depletion  Dorsal Hand No depletion  Patellar Region Mild depletion  Anterior Thigh Region Mild depletion  Posterior Calf Region Mild depletion  Edema (RD Assessment) None  Hair Reviewed  Eyes Reviewed  Mouth Reviewed  Skin Reviewed  Nails Reviewed       Diet Order:   Diet Order             Diet heart healthy/carb modified Room service appropriate? Yes; Fluid consistency: Thin  Diet effective now                   EDUCATION NEEDS:   Education needs have been addressed  Skin:  Skin Assessment: Reviewed RN Assessment  Last BM:  09/08/22 (type 2)  Height:   Ht  Readings from Last 1 Encounters:  09/06/22 5\' 7"  (1.702 m)    Weight:   Wt Readings from Last 1 Encounters:  09/06/22 68 kg    Ideal Body Weight:  61.4 kg  BMI:  Body mass index is 23.49 kg/m.  Estimated Nutritional Needs:   Kcal:  2050-2250  Protein:  105-120 grams  Fluid:  > 2 L    Levada Schilling, RD, LDN, CDCES Registered Dietitian II Certified Diabetes Care and Education Specialist Please refer to [facility] for RD and/or RD on-call/weekend/after hours pager

## 2022-09-09 NOTE — Assessment & Plan Note (Signed)
Appreciate dietitian consultation 

## 2022-09-09 NOTE — Progress Notes (Signed)
Progress Note   Patient: Bradley [name]:[id number] DOB: 1950-04-24 DOA: 09/06/2022     3 DOS: the patient was seen and examined on 09/09/2022   Brief hospital course: 72 y.o. male with medical history significant for HTN, DM, PAD, CAD, CKD 3a, depression and anxiety,NSCLC (s/p of XRT), COPD on 6 L O2 last hospitalized in April 2024 with COPD requiring BiPAP, discharged to hospice, who presents to the ED with a several day history of progressively worsening wheezing and shortness of breath unrelieved with home treatments, acutely worsening on the day of arrival.  .He arrived by EMS who administered DuoNebs and Solu-Medrol en route.  He has a nonproductive cough and no fever and chills no chest pain.  Denies lower extremity pain or swelling. ED course And data review: Tachycardic to 106, tachypneic to 23 but afebrile and normotensive O2 sats low to mid 90s on 6 L. VBG with pH 7.46 and pCO2 36.  Troponin 13 and BNP 330.  WBC 14.9.  Creatinine at baseline at 1.4.  Blood glucose elevated at 205. EKG, personally viewed and interpreted showing sinus tachycardia with RBBB Chest x-ray showed chronic changes without acute abnormality.  7/6.  Patient still feeling short of breath.  Continue IV Solu-Medrol.  Patient on high flow nasal cannula. 7/7.  Patient breathing a little bit harder and quicker this morning.  Complains of shortness of breath.  CT scan of the chest negative for pulmonary embolism but did show severe emphysema in the upper lobes and also masslike consolidation posterior to the right bronchus intermedius measuring slightly larger. 7/8.  Feels a little bit better today than yesterday.  Still short of breath with minimal activity.  Assessment and Plan: COPD exacerbation (HCC) Non-small cell carcinoma of the lung Acute on chronic respiratory failure with hypoxia Patient states that he uses a baseline oxygen of 6 to 8 L at home. This morning was on 6 L high flow nasal cannula and this afternoon  on 7 L high flow nasal cannula. Continue IV Solu-Medrol today and reassess tomorrow.  Continue nebulizer treatments.  On empiric Rocephin.  CT scan of the chest negative for PE.  Non-small cell lung cancer (NSCLC) (HCC) CT scan of the chest showing slightly larger mass.  Patient had received radiation in the past.  Functionally probably not the best candidate for biopsy and/or chemotherapy.  Potentially will need an outpatient PET/CT scan and oncology referral.  Uncontrolled type 2 diabetes mellitus with hyperglycemia, without long-term current use of insulin (HCC) Continue Tradjenta.  Hold metformin Sliding scale insulin coverage.  Add Semglee insulin while on steroids. Last hemoglobin A1c 7.5 back in January.  Essential hypertension Continue lower dose metoprolol  Chronic kidney disease, stage 3a (HCC) Creatinine 1.11 with a GFR greater than 60.  CAD (coronary artery disease) PAD Continue metoprolol, rosuvastatin and aspirin  Constipation Continue MiraLAX  Anxiety and depression Continue Ativan as needed  Protein-calorie malnutrition, moderate (HCC) Appreciate dietitian consultation.        Subjective: Patient feels a little bit better than yesterday but still very weak and tired and short of breath.  Patient admitted with COPD exacerbation and has end-stage COPD.  Desaturates with minimal movement.  Physical Exam: Vitals:   09/09/22 0800 09/09/22 0810 09/09/22 1151 09/09/22 1345  BP: (!) 111/59  102/69   Pulse: 77 75 74 86  Resp: 20 20 20 20   Temp: 97.7 F (36.5 C)  97.9 F (36.6 C)   TempSrc:      SpO2:  94% 92% 95% 90%  Weight:      Height:       Physical Exam HENT:     Head: Normocephalic.     Mouth/Throat:     Pharynx: No oropharyngeal exudate.  Eyes:     General: Lids are normal.     Conjunctiva/sclera: Conjunctivae normal.  Cardiovascular:     Rate and Rhythm: Normal rate and regular rhythm.     Heart sounds: Normal heart sounds, S1 normal and S2  normal.  Pulmonary:     Breath sounds: Examination of the right-middle field reveals decreased breath sounds. Examination of the left-middle field reveals decreased breath sounds. Examination of the right-lower field reveals decreased breath sounds. Examination of the left-lower field reveals decreased breath sounds. Decreased breath sounds present. No wheezing, rhonchi or rales.  Abdominal:     Palpations: Abdomen is soft.     Tenderness: There is no abdominal tenderness.  Musculoskeletal:     Right lower leg: No swelling.     Left lower leg: No swelling.  Skin:    General: Skin is warm.     Findings: No rash.  Neurological:     Mental Status: He is alert and oriented to person, place, and time.     Data Reviewed: White blood count 14.1, hemoglobin 13.2, platelet count 221, creatinine 1.11, glucose 116  Family Communication: Updated patient's daughter [name] on the phone  Disposition: Status is: Inpatient Remains inpatient appropriate because: Patient's family will come up and talk with him this evening about plans after hospitalization.  Planned Discharge Destination: Home with Home Health    Time spent: 28 minutes  Author: Alford Highland, MD 09/09/2022 3:24 PM  For on call review [email or web address].

## 2022-09-09 NOTE — Progress Notes (Signed)
Attempt to reach patient's daughter, [name] to discuss his discharge plan. No answer. Left a message.

## 2022-09-10 DIAGNOSIS — E440 Moderate protein-calorie malnutrition: Secondary | ICD-10-CM | POA: Insufficient documentation

## 2022-09-10 DIAGNOSIS — C3491 Malignant neoplasm of unspecified part of right bronchus or lung: Secondary | ICD-10-CM | POA: Diagnosis not present

## 2022-09-10 DIAGNOSIS — E1165 Type 2 diabetes mellitus with hyperglycemia: Secondary | ICD-10-CM | POA: Diagnosis not present

## 2022-09-10 DIAGNOSIS — J441 Chronic obstructive pulmonary disease with (acute) exacerbation: Secondary | ICD-10-CM | POA: Diagnosis not present

## 2022-09-10 DIAGNOSIS — J9621 Acute and chronic respiratory failure with hypoxia: Secondary | ICD-10-CM | POA: Diagnosis not present

## 2022-09-10 LAB — GLUCOSE, CAPILLARY
Glucose-Capillary: 89 mg/dL (ref 70–99)
Glucose-Capillary: 261 mg/dL — ABNORMAL HIGH (ref 70–99)
Glucose-Capillary: 311 mg/dL — ABNORMAL HIGH (ref 70–99)
Glucose-Capillary: 263 mg/dL — ABNORMAL HIGH (ref 70–99)
Glucose-Capillary: 180 mg/dL — ABNORMAL HIGH (ref 70–99)

## 2022-09-10 MED ORDER — MORPHINE SULFATE (CONCENTRATE) 10 MG/0.5ML PO SOLN: 5.0000 mg | ORAL | Status: DC | PRN

## 2022-09-10 MED ORDER — ALUM & MAG HYDROXIDE-SIMETH 200-200-20 MG/5ML PO SUSP: 30.0000 mL | ORAL | Status: DC | PRN

## 2022-09-10 MED ORDER — MELATONIN 5 MG PO TABS
5.0000 mg | ORAL_TABLET | Freq: Every day | ORAL | Status: DC
  Administered 2022-09-10 – 2022-09-12 (×3): 5 mg via ORAL
  Filled 2022-09-10 (×3): qty 1

## 2022-09-10 NOTE — Progress Notes (Signed)
Progress Note   Patient: Bradley [name] ZOX:[id number] DOB: 1951/01/01 DOA: 09/06/2022     4 DOS: the patient was seen and examined on 09/10/2022   Brief hospital course: 72 y.o. male with medical history significant for HTN, DM, PAD, CAD, CKD 3a, depression and anxiety,NSCLC (s/p of XRT), COPD on 6 L O2 last hospitalized in April 2024 with COPD requiring BiPAP, discharged to hospice, who presents to the ED with a several day history of progressively worsening wheezing and shortness of breath unrelieved with home treatments, acutely worsening on the day of arrival.  .He arrived by EMS who administered DuoNebs and Solu-Medrol en route.  He has a nonproductive cough and no fever and chills no chest pain.  Denies lower extremity pain or swelling. ED course And data review: Tachycardic to 106, tachypneic to 23 but afebrile and normotensive O2 sats low to mid 90s on 6 L. VBG with pH 7.46 and pCO2 36.  Troponin 13 and BNP 330.  WBC 14.9.  Creatinine at baseline at 1.4.  Blood glucose elevated at 205. EKG, personally viewed and interpreted showing sinus tachycardia with RBBB Chest x-ray showed chronic changes without acute abnormality.  7/6.  Patient still feeling short of breath.  Continue IV Solu-Medrol.  Patient on high flow nasal cannula. 7/7.  Patient breathing a little bit harder and quicker this morning.  Complains of shortness of breath.  CT scan of the chest negative for pulmonary embolism but did show severe emphysema in the upper lobes and also masslike consolidation posterior to the right bronchus intermedius measuring slightly larger. 7/8.  Feels a little bit better today than yesterday.  Still short of breath with minimal activity. 7/9.  Patient agreed that he is not the best candidate for a tissue biopsy and or systemic chemotherapy.  I mentioned that we can give Roxanol as needed for air hunger or shortness of breath.  Patient's daughter interested in resetting up hospice.  Assessment and  Plan: COPD exacerbation (HCC) Non-small cell carcinoma of the lung Acute on chronic respiratory failure with hypoxia Patient states that he uses a baseline oxygen of 6 to 8 L at home. This morning was on 6 L high flow nasal cannula. Continue IV Solu-Medrol today and reassess tomorrow.  Continue nebulizer treatments.  On empiric Rocephin.  CT scan of the chest negative for PE.  Added Roxanol as needed for air hunger and/or pain.  Non-small cell lung cancer (NSCLC) (HCC) CT scan of the chest showing slightly larger mass.  Patient had received radiation in the past.  Functionally probably not the best candidate for biopsy and/or chemotherapy.  Patient agreeable to this fact.  Will really set up hospice upon disposition.  Uncontrolled type 2 diabetes mellitus with hyperglycemia, without long-term current use of insulin (HCC) Continue Tradjenta.  Hold metformin Sliding scale insulin coverage.  Add Semglee insulin while on steroids. Last hemoglobin A1c 7.5 back in January.  Essential hypertension Continue lower dose metoprolol  Chronic kidney disease, stage 3a (HCC) Creatinine 1.11 with a GFR greater than 60.  CAD (coronary artery disease) PAD Continue metoprolol, rosuvastatin and aspirin  Malnutrition of moderate degree Continue supplements.  Constipation Continue MiraLAX  Anxiety and depression Continue Ativan as needed        Subjective: Patient feels a little bit better today.  Still desaturates with minimal movement and has to catch his breath.  Patient is not currently a good candidate for tissue biopsy and chemotherapy.  Patient and daughter agreeable to go back  on hospice.  Physical Exam: Vitals:   09/10/22 0750 09/10/22 0814 09/10/22 1219 09/10/22 1353  BP:  99/63 103/62   Pulse: 74 72 (!) 101 100  Resp: 18 18 16 20   Temp:  97.6 F (36.4 C) 97.6 F (36.4 C)   TempSrc:      SpO2: 94% 96% 94% (!) 89%  Weight:      Height:       Physical Exam HENT:     Head:  Normocephalic.     Mouth/Throat:     Pharynx: No oropharyngeal exudate.  Eyes:     General: Lids are normal.     Conjunctiva/sclera: Conjunctivae normal.  Cardiovascular:     Rate and Rhythm: Normal rate and regular rhythm.     Heart sounds: Normal heart sounds, S1 normal and S2 normal.  Pulmonary:     Breath sounds: Examination of the right-middle field reveals decreased breath sounds. Examination of the left-middle field reveals decreased breath sounds. Examination of the right-lower field reveals decreased breath sounds. Examination of the left-lower field reveals decreased breath sounds. Decreased breath sounds present. No wheezing, rhonchi or rales.  Abdominal:     Palpations: Abdomen is soft.     Tenderness: There is no abdominal tenderness.  Musculoskeletal:     Right lower leg: No swelling.     Left lower leg: No swelling.  Skin:    General: Skin is warm.     Findings: No rash.  Neurological:     Mental Status: He is alert and oriented to person, place, and time.     Data Reviewed: Creatinine 1.11, white blood cell count 14.1  Family Communication: Spoke with patient's daughter [name] on the phone  Disposition: Status is: Inpatient Remains inpatient appropriate because: Patient's family setting up room where patient will go after discharge.  Planned Discharge Destination: Home    Time spent: 28 minutes  Author: Alford Highland, MD 09/10/2022 4:43 PM  For on call review [email or web address].

## 2022-09-10 NOTE — TOC Progression Note (Signed)
Transition of Care [facility]) - Progression Note    Patient Details  Name: Bradley [name] MRN: [id number] Date of Birth: [birth year]  [facility]) CM/SW Contact  Truddie Hidden, RN Phone Number: 09/10/2022, 4:19 PM  Clinical Narrative:    Spoke with patient's daughter to discuss discharge plans. Patient will return home with hospice via [place].         Expected Discharge Plan and Services                                               Social Determinants of Health (SDOH) Interventions SDOH Screenings   Food Insecurity: No Food Insecurity (09/06/2022)  Housing: Patient Declined (09/07/2022)  Transportation Needs: No Transportation Needs (09/06/2022)  Utilities: Not At Risk (09/06/2022)  Depression (PHQ2-9): Low Risk  (07/13/2018)  Tobacco Use: Medium Risk (09/08/2022)    Readmission Risk Interventions    04/04/2022    3:56 PM 02/07/2021    3:13 PM 07/31/2020   10:04 AM  Readmission Risk Prevention Plan  Transportation Screening Complete Complete Complete  PCP or Specialist Appt within 3-5 Days Complete Complete Complete  HRI or Home Care Consult Complete Complete Complete  Social Work Consult for Recovery Care Planning/Counseling Complete Complete Not Complete  SW consult not completed comments   RNCM assigned to patient  Palliative Care Screening Not Applicable Not Applicable Not Applicable  Medication Review (RN Care Manager) Complete Complete Complete

## 2022-09-10 NOTE — Inpatient Diabetes Management (Signed)
Inpatient Diabetes Program Recommendations  AACE/ADA: New Consensus Statement on Inpatient Glycemic Control (2015)  Target Ranges:  Prepandial:   less than 140 mg/dL      Peak postprandial:   less than 180 mg/dL (1-2 hours)      Critically ill patients:  140 - 180 mg/dL   Lab Results  Component Value Date   GLUCAP 89 09/10/2022   HGBA1C 7.5 (H) 04/02/2022    Review of Glycemic Control  Latest Reference Range & Units 09/09/22 08:03 09/09/22 11:53 09/09/22 16:26 09/09/22 20:33 09/10/22 08:15  Glucose-Capillary 70 - 99 mg/dL 161 (H) 096 (H) 045 (H) 236 (H) 89   Diabetes history: DM 2 Outpatient Diabetes medications: metformin 750 mg bid, Tradjenta 5 mg Daily Current orders for Inpatient glycemic control:  Semglee 15 units qhs Tradjenta 5 mg Daily Novolog 0-20 units tid + hs  Solumedrol 80 mg Daily Glucerna tid between meals  Inpatient Diabetes Program Recommendations:    Glucose trends increase after steroid dose and Glucerna supplement:  -   Consider adding Novolog 3 units tid meal coverage if eating >50% of meals  Thanks,  Christena Deem RN, MSN, BC-ADM Inpatient Diabetes Coordinator Team Pager [phone] (8a-5p)

## 2022-09-10 NOTE — Assessment & Plan Note (Signed)
Continue supplements

## 2022-09-11 DIAGNOSIS — E440 Moderate protein-calorie malnutrition: Secondary | ICD-10-CM

## 2022-09-11 DIAGNOSIS — J9611 Chronic respiratory failure with hypoxia: Secondary | ICD-10-CM

## 2022-09-11 DIAGNOSIS — C3491 Malignant neoplasm of unspecified part of right bronchus or lung: Secondary | ICD-10-CM

## 2022-09-11 DIAGNOSIS — E1165 Type 2 diabetes mellitus with hyperglycemia: Secondary | ICD-10-CM | POA: Diagnosis not present

## 2022-09-11 DIAGNOSIS — J441 Chronic obstructive pulmonary disease with (acute) exacerbation: Secondary | ICD-10-CM | POA: Diagnosis not present

## 2022-09-11 LAB — GLUCOSE, CAPILLARY
Glucose-Capillary: 105 mg/dL — ABNORMAL HIGH (ref 70–99)
Glucose-Capillary: 228 mg/dL — ABNORMAL HIGH (ref 70–99)
Glucose-Capillary: 238 mg/dL — ABNORMAL HIGH (ref 70–99)
Glucose-Capillary: 237 mg/dL — ABNORMAL HIGH (ref 70–99)

## 2022-09-11 NOTE — Inpatient Diabetes Management (Signed)
Inpatient Diabetes Program Recommendations  AACE/ADA: New Consensus Statement on Inpatient Glycemic Control   Target Ranges:  Prepandial:   less than 140 mg/dL      Peak postprandial:   less than 180 mg/dL (1-2 hours)      Critically ill patients:  140 - 180 mg/dL    Latest Reference Range & Units 09/10/22 08:15 09/10/22 11:41 09/10/22 12:20 09/10/22 17:02 09/10/22 23:03 09/11/22 07:47  Glucose-Capillary 70 - 99 mg/dL 89 604 (H) 540 (H) 981 (H) 180 (H) 105 (H)   Review of Glycemic Control  Diabetes history: DM2 Outpatient Diabetes medications: Metformin 750 mg BID, Tradjenta 5 mg daily Current orders for Inpatient glycemic control: Semglee 15 units at bedtime, Novolog 0-20 units TID with meals, Novolog 0-5 units at bedtime, Tradjenta 5 mg daily; Solumedrol 80 mg daily  Inpatient Diabetes Program Recommendations:    Insulin: If steroids are continued, please consider ordering Novolog 3 units TID with meals for meal coverage if patient eats at least 50% of meals.  Thanks, Orlando Penner, RN, MSN, CDCES Diabetes Coordinator Inpatient Diabetes Program [phone] (Team Pager from 8am to 5pm)

## 2022-09-11 NOTE — Plan of Care (Signed)
Patient understands the progrnosis of his disease

## 2022-09-11 NOTE — TOC Progression Note (Signed)
Transition of Care [facility]) - Progression Note    Patient Details  Name: Bradley [name] MRN: [id number] Date of Birth: [birth year]  Transition of Care [facility]) CM/SW Contact  Truddie Hidden, RN Phone Number: 09/11/2022, 10:22 AM  Clinical Narrative:    Spoke with patient's daughter [name] regarding patient's discharge plan. She would like to consider changing from [facility] to [facility]        Expected Discharge Plan and Services                                               Social Determinants of Health (SDOH) Interventions SDOH Screenings   Food Insecurity: No Food Insecurity (09/06/2022)  Housing: Patient Declined (09/07/2022)  Transportation Needs: No Transportation Needs (09/06/2022)  Utilities: Not At Risk (09/06/2022)  Depression (PHQ2-9): Low Risk  (07/13/2018)  Tobacco Use: Medium Risk (09/08/2022)    Readmission Risk Interventions    04/04/2022    3:56 PM 02/07/2021    3:13 PM 07/31/2020   10:04 AM  Readmission Risk Prevention Plan  Transportation Screening Complete Complete Complete  PCP or Specialist Appt within 3-5 Days Complete Complete Complete  HRI or Home Care Consult Complete Complete Complete  Social Work Consult for Recovery Care Planning/Counseling Complete Complete Not Complete  SW consult not completed comments   RNCM assigned to patient  Palliative Care Screening Not Applicable Not Applicable Not Applicable  Medication Review (RN Care Manager) Complete Complete Complete

## 2022-09-11 NOTE — Progress Notes (Signed)
Progress Note   Patient: Bradley [name] ZOX:[id number] DOB: 05-Apr-1950 DOA: 09/06/2022     5 DOS: the patient was seen and examined on 09/11/2022   Brief hospital course: 72 y.o. male with medical history significant for HTN, DM, PAD, CAD, CKD 3a, depression and anxiety,NSCLC (s/p of XRT), COPD on 6 L O2 last hospitalized in April 2024 with COPD requiring BiPAP, discharged to hospice, who presents to the ED with a several day history of progressively worsening wheezing and shortness of breath unrelieved with home treatments, acutely worsening on the day of arrival.  .He arrived by EMS who administered DuoNebs and Solu-Medrol en route.  He has a nonproductive cough and no fever and chills no chest pain.  Denies lower extremity pain or swelling. ED course And data review: Tachycardic to 106, tachypneic to 23 but afebrile and normotensive O2 sats low to mid 90s on 6 L. VBG with pH 7.46 and pCO2 36.  Troponin 13 and BNP 330.  WBC 14.9.  Creatinine at baseline at 1.4.  Blood glucose elevated at 205. EKG, personally viewed and interpreted showing sinus tachycardia with RBBB Chest x-ray showed chronic changes without acute abnormality.  7/6.  Patient still feeling short of breath.  Continue IV Solu-Medrol.  Patient on high flow nasal cannula. 7/7.  Patient breathing a little bit harder and quicker this morning.  Complains of shortness of breath.  CT scan of the chest negative for pulmonary embolism but did show severe emphysema in the upper lobes and also masslike consolidation posterior to the right bronchus intermedius measuring slightly larger. 7/8.  Feels a little bit better today than yesterday.  Still short of breath with minimal activity. 7/9.  Patient agreed that he is not the best candidate for a tissue biopsy and or systemic chemotherapy.  I mentioned that we can give Roxanol as needed for air hunger or shortness of breath.  Patient's daughter interested in resetting up hospice. 7/10 patient remained  stable, awaiting hospital bed delivered to his home.  Assessment and Plan: COPD exacerbation (HCC) Non-small cell carcinoma of the lung Acute on chronic respiratory failure with hypoxia Patient states that he uses a baseline oxygen of 6 to 8 L at home. This morning was on 6 L high flow nasal cannula. Continue IV Solu-Medrol today and reassess tomorrow.  Continue nebulizer treatments.  On empiric Rocephin.  CT scan of the chest negative for PE.  Continue Roxanol as needed for air hunger and/or pain.  Non-small cell lung cancer (NSCLC) (HCC) CT scan of the chest showing slightly larger mass.  Patient had received radiation in the past.  Functionally probably not the best candidate for biopsy and/or chemotherapy.  Patient agreeable to hospice at home disposition.  Uncontrolled type 2 diabetes mellitus with hyperglycemia, without long-term current use of insulin (HCC) Continue Tradjenta.  Hold metformin Sliding scale insulin coverage.  Continue Semglee insulin while on steroids. Last hemoglobin A1c 7.5 back in January.  Essential hypertension Continue lower dose metoprolol  Chronic kidney disease, stage 3a (HCC) Creatinine 1.11 with a GFR greater than 60.  CAD (coronary artery disease) PAD Continue metoprolol, rosuvastatin and aspirin  Malnutrition of moderate degree Continue supplements.  Constipation Continue MiraLAX  Anxiety and depression Continue Ativan as needed  Disposition-Home with hospice, hospital bed delivery pending      Subjective: Patient is seen and examined today morning.  He is lying comfortably.  Denies any complaints of chest pain, shortness of breath.  Patient is currently on 7 L  supplemental oxygen.  Physical Exam: Vitals:   09/11/22 0750 09/11/22 1114 09/11/22 1429 09/11/22 1617  BP:  105/61  98/62  Pulse: 75 89 81 80  Resp: 16 16 18 15   Temp:  97.6 F (36.4 C)  98 F (36.7 C)  TempSrc:    Oral  SpO2: 95% 93% 94% 95%  Weight:      Height:        General - Elderly African-American male, mild respiratory distress HEENT - PERRLA, EOMI, atraumatic head, non tender sinuses. Lung -diffuse rhonchi, rales Heart - S1, S2 heard, no murmurs, rubs, no pedal edema Neuro - Alert, awake and oriented x 3, non focal exam. Skin - Warm and dry. Data Reviewed:  Blood sugars  Family Communication: Patient understands that he is waiting for hospital bed for hospice at home  Disposition: Status is: Inpatient Remains inpatient appropriate because: Safe discharge plan, hospital bed delivery  Planned Discharge Destination:  Hospice at home    Time spent: 42 minutes  Author: Marcelino Duster, MD 09/11/2022 5:41 PM  For on call review [email or web address].

## 2022-09-11 NOTE — Progress Notes (Addendum)
Nutrition Follow-up  DOCUMENTATION CODES:   Non-severe (moderate) malnutrition in context of chronic illness  INTERVENTION:   -Continue MVI with minerals daily -Continue carb modified diet for wider variety of meal selections -Continue Glucerna Shake po TID, each supplement provides 220 kcal and 10 grams of protein   NUTRITION DIAGNOSIS:   Moderate Malnutrition related to chronic illness (COPD) as evidenced by mild fat depletion, mild muscle depletion, percent weight loss.  Ongoing  GOAL:   Patient will meet greater than or equal to 90% of their needs  Progressing   MONITOR:   PO intake, Supplement acceptance  REASON FOR ASSESSMENT:   Malnutrition Screening Tool    ASSESSMENT:   Pt with medical history significant for HTN, DM, PAD, CAD, CKD 3a, depression and anxiety,NSCLC (s/p of XRT), COPD on 6 L O2 last hospitalized in April 2024 with COPD requiring BiPAP, discharged to hospice, who presents with a several day history of progressively worsening wheezing and shortness of breath unrelieved with home treatments, acutely worsening on the day of admission.  Reviewed I/O's: -340 ml x 24 hours and -3 L since admission  UOP: 1.6 L x 24 hours  Pt sleeping soundly at time of visit. He did no arouse to voice. Breakfast tray on tray table. Pt with good appetite, consuming 90-100% of meals. Pt likes Glucerna supplements and has been drinking them.    No new wt since admission.   Per TOC notes, plan to discharge home with hospice. Per MD notes, pt not a candidate for biopsy or further cancer treatments.  Medications reviewed and include folic acid, solu-medrol, and miralax.   Labs reviewed: CBGS: 105-180 (inpatient orders for glycemic control are 5 mg tradjent a daily, 0-20 units insulin aspart TID with meals, 0-5 units insulin aspart daily at bedtime, and 15 units insulin glargine-yfgn daily at bedtime).    Diet Order:   Diet Order             Diet Carb Modified Fluid  consistency: Thin  Diet effective now                   EDUCATION NEEDS:   Education needs have been addressed  Skin:  Skin Assessment: Reviewed RN Assessment  Last BM:  09/08/22 (type 2)  Height:   Ht Readings from Last 1 Encounters:  09/06/22 5\' 7"  (1.702 m)    Weight:   Wt Readings from Last 1 Encounters:  09/06/22 68 kg    Ideal Body Weight:  61.4 kg  BMI:  Body mass index is 23.49 kg/m.  Estimated Nutritional Needs:   Kcal:  2050-2250  Protein:  105-120 grams  Fluid:  > 2 L    Levada Schilling, RD, LDN, CDCES Registered Dietitian II Certified Diabetes Care and Education Specialist Please refer to [facility] for RD and/or RD on-call/weekend/after hours pager

## 2022-09-11 NOTE — Progress Notes (Signed)
[facility] Civil engineer, contracting [facility]) Hospital Liaison Note   Received request from [facility], Princess Perna, RN,  to speak with patent's daughter today to discuss Hospice Services at home.    Patent/family requested additional time to consider options before proceeding. All questions answered and no concerns voiced.  At this time, patient is not under review for [employer] services as family continues to have ongoing GOC discussions.  Family will meet tonight and contact HL tomorrow with a decision.     [facility] information and contact numbers given to family & above information shared with TOC.   Please call with any questions/concerns.    Thank you for the opportunity to participate in this patient's care.  [facility],  [phone]

## 2022-09-12 DIAGNOSIS — J9611 Chronic respiratory failure with hypoxia: Secondary | ICD-10-CM | POA: Diagnosis not present

## 2022-09-12 DIAGNOSIS — E1165 Type 2 diabetes mellitus with hyperglycemia: Secondary | ICD-10-CM | POA: Diagnosis not present

## 2022-09-12 DIAGNOSIS — J441 Chronic obstructive pulmonary disease with (acute) exacerbation: Secondary | ICD-10-CM | POA: Diagnosis not present

## 2022-09-12 DIAGNOSIS — C3491 Malignant neoplasm of unspecified part of right bronchus or lung: Secondary | ICD-10-CM | POA: Diagnosis not present

## 2022-09-12 LAB — GLUCOSE, CAPILLARY
Glucose-Capillary: 191 mg/dL — ABNORMAL HIGH (ref 70–99)
Glucose-Capillary: 284 mg/dL — ABNORMAL HIGH (ref 70–99)
Glucose-Capillary: 278 mg/dL — ABNORMAL HIGH (ref 70–99)
Glucose-Capillary: 206 mg/dL — ABNORMAL HIGH (ref 70–99)

## 2022-09-12 NOTE — Inpatient Diabetes Management (Signed)
Inpatient Diabetes Program Recommendations  AACE/ADA: New Consensus Statement on Inpatient Glycemic Control   Target Ranges:  Prepandial:   less than 140 mg/dL      Peak postprandial:   less than 180 mg/dL (1-2 hours)      Critically ill patients:  140 - 180 mg/dL    Latest Reference Range & Units 09/11/22 07:47 09/11/22 11:16 09/11/22 16:18 09/11/22 21:12 09/12/22 08:32 09/12/22 11:55  Glucose-Capillary 70 - 99 mg/dL 696 (H) 295 (H) 284 (H) 237 (H) 191 (H) 284 (H)   Review of Glycemic Control  Diabetes history: DM2 Outpatient Diabetes medications: Metformin 750 mg BID, Tradjenta 5 mg daily Current orders for Inpatient glycemic control: Semglee 15 units at bedtime, Novolog 0-20 units TID with meals, Novolog 0-5 units at bedtime, Tradjenta 5 mg daily; Solumedrol 80 mg daily   Inpatient Diabetes Program Recommendations:     Insulin: If steroids are continued, please consider ordering Novolog 3 units TID with meals for meal coverage if patient eats at least 50% of meals.  Thanks, Orlando Penner, RN, MSN, CDCES Diabetes Coordinator Inpatient Diabetes Program [phone] (Team Pager from 8am to 5pm)

## 2022-09-12 NOTE — Progress Notes (Signed)
Civil engineer, contracting [facility]) Hospital Liaison Note  Received request from Princess Perna, RN, Transitions of Care Manager, for hospice services at home after discharge.  Spoke with daughter, [name], to initiate education related to hospice philosophy, services, and team approach to care. Daughter verbalized understanding of information given.  Per discussion, the plan is for discharge home by EMS.    DME needs discussed.  Patient has the following equipment in the home: Home 02. Patient/family requests the following equipment in the home: hosp bed, over bed table, BSC and wheelchair that will hold a portable 02 tank.    Patient will dc to daughter, [name]'s home at [address] [place] [address].  Asencion Islam is the contact to coordinate delivery of DME.  She can be reached at [phone].    Please send signed and completed DNR home with the patient/family.  Please provide prescriptions at discharge as needed to ensure ongoing symptom management.   [facility] information and contact numbers given to daughter, [name].   Above information shared with Carl Best, Transitions of Care Manager.     Please call with any Hospice related questions or concerns.  Thank you for the opportunity to participate in this patient's care.  Redge Gainer, [facility] Liaison [phone]

## 2022-09-12 NOTE — Progress Notes (Signed)
Progress Note   Patient: Bradley [name] QMV:[id number] DOB: Sep 28, 1950 DOA: 09/06/2022     6 DOS: the patient was seen and examined on 09/12/2022   Brief hospital course: 72 y.o. male with medical history significant for HTN, DM, PAD, CAD, CKD 3a, depression and anxiety,NSCLC (s/p of XRT), COPD on 6 L O2 last hospitalized in April 2024 with COPD requiring BiPAP, discharged to hospice, who presents to the ED with a several day history of progressively worsening wheezing and shortness of breath unrelieved with home treatments, acutely worsening on the day of arrival.  .He arrived by EMS who administered DuoNebs and Solu-Medrol en route.  He has a nonproductive cough and no fever and chills no chest pain.  Denies lower extremity pain or swelling. ED course And data review: Tachycardic to 106, tachypneic to 23 but afebrile and normotensive O2 sats low to mid 90s on 6 L. VBG with pH 7.46 and pCO2 36.  Troponin 13 and BNP 330.  WBC 14.9.  Creatinine at baseline at 1.4.  Blood glucose elevated at 205. EKG, personally viewed and interpreted showing sinus tachycardia with RBBB Chest x-ray showed chronic changes without acute abnormality.  7/6.  Patient still feeling short of breath.  Continue IV Solu-Medrol.  Patient on high flow nasal cannula. 7/7.  Patient breathing a little bit harder and quicker this morning.  Complains of shortness of breath.  CT scan of the chest negative for pulmonary embolism but did show severe emphysema in the upper lobes and also masslike consolidation posterior to the right bronchus intermedius measuring slightly larger. 7/8.  Feels a little bit better today than yesterday.  Still short of breath with minimal activity. 7/9.  Patient agreed that he is not the best candidate for a tissue biopsy and or systemic chemotherapy.  I mentioned that we can give Roxanol as needed for air hunger or shortness of breath.  Patient's daughter interested in resetting up hospice. 7/10 patient remained  stable, awaiting hospital bed delivered to his home. 7/11 No overnight issues. He offer no complaints. Advised out of bed.  Assessment and Plan: COPD exacerbation (HCC) Non-small cell carcinoma of the lung Acute on chronic respiratory failure with hypoxia Patient states that he uses a baseline oxygen of 6 to 8 L at home. This morning was on 6 L high flow nasal cannula. Continue IV Solu-Medrol today and reassess tomorrow.  Continue nebulizer treatments.  On empiric Rocephin.  CT scan of the chest negative for PE.  Continue Roxanol as needed for air hunger and/or pain.  Non-small cell lung cancer (NSCLC) (HCC) CT scan of the chest showing slightly larger mass.  Patient had received radiation in the past.  Functionally probably not the best candidate for biopsy and/or chemotherapy.  Patient agreeable to hospice at home disposition.  Uncontrolled type 2 diabetes mellitus with hyperglycemia, without long-term current use of insulin (HCC) Continue Tradjenta.  Hold metformin Sliding scale insulin coverage.  Continue Semglee insulin while on steroids. Last hemoglobin A1c 7.5 back in January.  Essential hypertension Continue lower dose metoprolol  Chronic kidney disease, stage 3a (HCC) Creatinine 1.11 with a GFR greater than 60.  CAD (coronary artery disease) PAD Continue metoprolol, rosuvastatin and aspirin  Malnutrition of moderate degree Continue supplements.  Constipation Continue MiraLAX  Anxiety and depression Continue Ativan as needed  Disposition-Home with hospice, hospital bed delivery pending      Subjective: Patient is seen and examined today morning.  He is lying comfortably.  Denies any complaints, no  overnight issues.  Patient is currently on 6-7 L supplemental oxygen.  Physical Exam: Vitals:   09/12/22 0512 09/12/22 0754 09/12/22 0832 09/12/22 1153  BP: 111/82  114/66 105/68  Pulse: 91  100 81  Resp: 20  16 16   Temp: 97.8 F (36.6 C)  (!) 97.5 F (36.4 C)  (!) 97.5 F (36.4 C)  TempSrc:      SpO2: 98% 95% 92% 94%  Weight:      Height:       General - Elderly African-American male, mild respiratory distress HEENT - PERRLA, EOMI, atraumatic head, non tender sinuses. Lung -diffuse rhonchi, rales Heart - S1, S2 heard, no murmurs, rubs, no pedal edema Neuro - Alert, awake and oriented x 3, non focal exam. Skin - Warm and dry. Data Reviewed:  Blood sugars  Family Communication: Patient understands that he is waiting for hospital bed for hospice at home  Disposition: Status is: Inpatient Remains inpatient appropriate because: Safe discharge plan, hospital bed delivery  Planned Discharge Destination:  Hospice at home    Time spent: 40 minutes  Author: Marcelino Duster, MD 09/12/2022 4:50 PM  For on call review [email or web address].

## 2022-09-12 NOTE — TOC Progression Note (Addendum)
Transition of Care [facility]) - Progression Note    Patient Details  Name: Bradley [name] MRN: [id number] Date of Birth: October 03, 1950  Transition [facility]) CM/SW Contact  Truddie Hidden, RN Phone Number: 09/12/2022, 12:45 PM  Clinical Narrative:    Per correspondence from [facility] liaison Ree Kida. Family has accepted care to be rendered at discharge via [facility].   1:03pm Spoke with patient's daughter Jacki Cones. She inquired about a ramp for patient's home. She has been advised TOC does not coordinate ramp installations. She states they are still awaiting the hospital bed delivery to her sister's home that was ordered from [facility] [place]. No other questions or concerns voiced at this time.         Expected Discharge Plan and Services                                               Social Determinants of Health (SDOH) Interventions SDOH Screenings   Food Insecurity: No Food Insecurity (09/06/2022)  Housing: Patient Declined (09/07/2022)  Transportation Needs: No Transportation Needs (09/06/2022)  Utilities: Not At Risk (09/06/2022)  Depression (PHQ2-9): Low Risk  (07/13/2018)  Financial Resource Strain: Low Risk  (04/30/2022)   Received from [facility], [facility]  Tobacco Use: Medium Risk (09/08/2022)    Readmission Risk Interventions    04/04/2022    3:56 PM 02/07/2021    3:13 PM 07/31/2020   10:04 AM  Readmission Risk Prevention Plan  Transportation Screening Complete Complete Complete  PCP or Specialist Appt within 3-5 Days Complete Complete Complete  HRI or Home Care Consult Complete Complete Complete  Social Work Consult for Recovery Care Planning/Counseling Complete Complete Not Complete  SW consult not completed comments   RNCM assigned to patient  Palliative Care Screening Not Applicable Not Applicable Not Applicable  Medication Review (RN Care Manager) Complete Complete Complete

## 2022-09-13 DIAGNOSIS — J441 Chronic obstructive pulmonary disease with (acute) exacerbation: Secondary | ICD-10-CM | POA: Diagnosis not present

## 2022-09-13 DIAGNOSIS — J9611 Chronic respiratory failure with hypoxia: Secondary | ICD-10-CM | POA: Diagnosis not present

## 2022-09-13 DIAGNOSIS — C3491 Malignant neoplasm of unspecified part of right bronchus or lung: Secondary | ICD-10-CM | POA: Diagnosis not present

## 2022-09-13 DIAGNOSIS — E1165 Type 2 diabetes mellitus with hyperglycemia: Secondary | ICD-10-CM | POA: Diagnosis not present

## 2022-09-13 LAB — GLUCOSE, CAPILLARY
Glucose-Capillary: 229 mg/dL — ABNORMAL HIGH (ref 70–99)
Glucose-Capillary: 183 mg/dL — ABNORMAL HIGH (ref 70–99)
Glucose-Capillary: 245 mg/dL — ABNORMAL HIGH (ref 70–99)

## 2022-09-13 LAB — CREATININE, SERUM
Creatinine, Ser: 0.96 mg/dL (ref 0.61–1.24)
GFR, Estimated: >60 mL/min (ref >60)

## 2022-09-13 MED ORDER — PREDNISONE 10 MG PO TABS: ORAL_TABLET | ORAL | 0 refills | Status: AC

## 2022-09-13 MED ORDER — METOPROLOL TARTRATE 25 MG PO TABS: 25.0000 mg | ORAL_TABLET | Freq: Two times a day (BID) | ORAL | Status: DC

## 2022-09-13 NOTE — TOC Transition Note (Addendum)
Transition of Care [facility]) - CM/SW Discharge Note   Patient Details  Name: Bradley [name] MRN: [id number] Date of Birth: 1950/09/11  Transition of Care [facility]) CM/SW Contact:  Liliana Cline, LCSW Phone Number: 09/13/2022, 2:31 PM   Clinical Narrative:    Patient is discharging to his daughter's home with [facility]. Watt Climes with [facility] has updated the daughter.  EMS paperwork completed. Will call for EMS transport when ready.  3:19- [facility] called for transport. Patient is 7th on the list. RN notified.   Final next level of care: Home w Hospice Care Barriers to Discharge: Barriers Resolved   Patient Goals and CMS Choice      Discharge Placement                         Discharge Plan and Services Additional resources added to the After Visit Summary for                                       Social Determinants of Health (SDOH) Interventions SDOH Screenings   Food Insecurity: No Food Insecurity (09/06/2022)  Housing: Patient Declined (09/07/2022)  Transportation Needs: No Transportation Needs (09/06/2022)  Utilities: Not At Risk (09/06/2022)  Depression (PHQ2-9): Low Risk  (07/13/2018)  Financial Resource Strain: Low Risk  (04/30/2022)   Received from [facility], [facility]  Tobacco Use: Medium Risk (09/08/2022)     Readmission Risk Interventions    04/04/2022    3:56 PM 02/07/2021    3:13 PM 07/31/2020   10:04 AM  Readmission Risk Prevention Plan  Transportation Screening Complete Complete Complete  PCP or Specialist Appt within 3-5 Days Complete Complete Complete  HRI or Home Care Consult Complete Complete Complete  Social Work Consult for Recovery Care Planning/Counseling Complete Complete Not Complete  SW consult not completed comments   RNCM assigned to patient  Palliative Care Screening Not Applicable Not Applicable Not Applicable  Medication Review (RN Care Manager) Complete Complete Complete

## 2022-09-13 NOTE — Discharge Summary (Signed)
Physician Discharge Summary   Patient: Bradley [name] MRN: [id number] DOB: [birth year]  Admit date:     09/06/2022  Discharge date: 09/13/22  Discharge Physician: Marcelino Duster   PCP: Dione Housekeeper, MD   Recommendations at discharge:    PCP follow up. Hospice services  Discharge Diagnoses: Active Problems:   COPD exacerbation (HCC)   Non-small cell lung cancer (NSCLC) (HCC)   Chronic respiratory failure with hypoxia (HCC)   Uncontrolled type 2 diabetes mellitus with hyperglycemia, without long-term current use of insulin (HCC)   Essential hypertension   Chronic kidney disease, stage 3a (HCC)   CAD (coronary artery disease)   Anxiety and depression   Constipation   Malnutrition of moderate degree  Resolved Problems:   * No resolved hospital problems. *  Hospital Course: 72 y.o. male with medical history significant for HTN, DM, PAD, CAD, CKD 3a, depression and anxiety,NSCLC (s/p of XRT), COPD on 6 L O2 last hospitalized in April 2024 with COPD requiring BiPAP, discharged to hospice, who presents to the ED with a several day history of progressively worsening wheezing and shortness of breath unrelieved with home treatments, acutely worsening on the day of arrival.  .He arrived by EMS who administered DuoNebs and Solu-Medrol en route.  He has a nonproductive cough and no fever and chills no chest pain.  Denies lower extremity pain or swelling. ED course And data review: Tachycardic to 106, tachypneic to 23 but afebrile and normotensive O2 sats low to mid 90s on 6 L. VBG with pH 7.46 and pCO2 36.  Troponin 13 and BNP 330.  WBC 14.9.  Creatinine at baseline at 1.4.  Blood glucose elevated at 205. EKG, personally viewed and interpreted showing sinus tachycardia with RBBB Chest x-ray showed chronic changes without acute abnormality.   7/6.  Patient still feeling short of breath.  Continue IV Solu-Medrol.  Patient on high flow nasal cannula. 7/7.  Patient breathing a little  bit harder and quicker this morning.  Complains of shortness of breath.  CT scan of the chest negative for pulmonary embolism but did show severe emphysema in the upper lobes and also masslike consolidation posterior to the right bronchus intermedius measuring slightly larger. 7/8.  Feels a little bit better today than yesterday.  Still short of breath with minimal activity. 7/9.  Patient agreed that he is not the best candidate for a tissue biopsy and or systemic chemotherapy.  I mentioned that we can give Roxanol as needed for air hunger or shortness of breath.  Patient's daughter interested in resetting up hospice. 7/10 patient remained stable, awaiting hospital bed delivered to his home. 7/11 No overnight issues. He offer no complaints. Advised out of bed. 7/12 Patient being discharged back home hospice, DME equipment delivered.  Assessment and Plan: COPD exacerbation (HCC) Non-small cell carcinoma of the lung Acute on chronic respiratory failure with hypoxia Patient states that he uses a baseline oxygen of 6 to 8 L at home. This morning was on 6-7 L high flow nasal cannula. IV Solu-Medrol transitioned to oral prednisone taper.  Continue nebulizer treatments.  finished empiric Rocephin therapy.  CT scan of the chest negative for PE.  Continue Roxanol as needed for air hunger and/or pain.   Non-small cell lung cancer (NSCLC) (HCC) CT scan of the chest showing slightly larger mass.  Patient had received radiation in the past.  Functionally probably not the best candidate for biopsy and/or chemotherapy.  Patient agreeable to hospice at home disposition.  Uncontrolled type 2 diabetes mellitus with hyperglycemia, without long-term current use of insulin (HCC) Continue Tradjenta, metformin Carb consistent diet.   Essential hypertension Continue metoprolol   Chronic kidney disease, stage 3a (HCC) Creatinine 1.11 with a GFR greater than 60.   CAD (coronary artery disease) PAD Continue  metoprolol, rosuvastatin and aspirin   Malnutrition of moderate degree Continue supplements.   Constipation Continue MiraLAX   Anxiety and depression Continue Ativan as needed   Disposition-Home with hospice, hospital bed other DME delivered home per TOC.        Consultants: none Procedures performed: none  Disposition: Hospice care Diet recommendation:  Discharge Diet Orders (From admission, onward)     Start     Ordered   09/13/22 0000  Diet - low sodium heart healthy        09/13/22 1433           Cardiac diet DISCHARGE MEDICATION: Allergies as of 09/13/2022       Reactions   Ace Inhibitors Other (See Comments)   Hypotension        Medication List     STOP taking these medications    predniSONE 10 MG (21) Tbpk tablet Commonly known as: STERAPRED UNI-PAK 21 TAB Replaced by: predniSONE 10 MG tablet You also have another medication with the same name that you need to continue taking as instructed.       TAKE these medications    albuterol 108 (90 Base) MCG/ACT inhaler Commonly known as: VENTOLIN HFA Inhale 2 puffs into the lungs every 6 (six) hours as needed for wheezing.   aspirin EC 81 MG tablet Take 81 mg by mouth every evening.   famotidine 20 MG tablet Commonly known as: PEPCID Take 20 mg by mouth daily.   fluticasone 50 MCG/ACT nasal spray Commonly known as: FLONASE Place 1 spray into both nostrils daily as needed for allergies or rhinitis.   folic acid 1 MG tablet Commonly known as: FOLVITE Take 1 tablet (1 mg total) by mouth daily.   furosemide 20 MG tablet Commonly known as: LASIX Take 20 mg by mouth as needed for edema.   ipratropium-albuterol 0.5-2.5 (3) MG/3ML Soln Commonly known as: DUONEB Take 3 mLs by nebulization every 4 (four) hours as needed.   linagliptin 5 MG Tabs tablet Commonly known as: TRADJENTA Take 5 mg by mouth every morning.   LORazepam 0.5 MG tablet Commonly known as: ATIVAN Take 0.5 mg by mouth  every 6 (six) hours as needed.   metFORMIN 750 MG 24 hr tablet Commonly known as: GLUCOPHAGE-XR Take 750 mg by mouth 2 (two) times daily.   metoprolol tartrate 25 MG tablet Commonly known as: LOPRESSOR Take 1 tablet (25 mg total) by mouth 2 (two) times daily. What changed:  medication strength how much to take   montelukast 10 MG tablet Commonly known as: SINGULAIR Take 10 mg by mouth at bedtime.   multivitamin with minerals Tabs tablet Take 1 tablet by mouth daily.   predniSONE 10 MG tablet Commonly known as: DELTASONE Take 10 mg by mouth daily. What changed:  Another medication with the same name was added. Make sure you understand how and when to take each. Another medication with the same name was removed. Continue taking this medication, and follow the directions you see here.   predniSONE 10 MG tablet Commonly known as: DELTASONE Take 6 tablets (60 mg total) by mouth daily for 3 days, THEN 5 tablets (50 mg total) daily for 3 days, THEN  4 tablets (40 mg total) daily for 3 days, THEN 3 tablets (30 mg total) daily for 3 days, THEN 2 tablets (20 mg total) daily for 3 days, THEN 1 tablet (10 mg total) daily for 3 days. Continue prednisone 10 mg after finishing taper. Start taking on: September 13, 2022 What changed: You were already taking a medication with the same name, and this prescription was added. Make sure you understand how and when to take each. Replaces: predniSONE 10 MG (21) Tbpk tablet   rosuvastatin 40 MG tablet Commonly known as: CRESTOR Take 40 mg by mouth every morning.   thiamine 100 MG tablet Commonly known as: Vitamin B-1 Take 1 tablet (100 mg total) by mouth daily.   Trelegy Ellipta 100-62.5-25 MCG/ACT Aepb Generic drug: Fluticasone-Umeclidin-Vilant Inhale 1 puff into the lungs daily.        Discharge Exam: Filed Weights   09/06/22 1826  Weight: 68 kg   General - Elderly African-American male, mild respiratory distress HEENT - PERRLA, EOMI,  atraumatic head, non tender sinuses. Lung -diffuse rhonchi, rales Heart - S1, S2 heard, no murmurs, rubs, no pedal edema Neuro - Alert, awake and oriented x 3, non focal exam. Skin - Warm and dry.  Condition at discharge: stable  The results of significant diagnostics from this hospitalization (including imaging, microbiology, ancillary and laboratory) are listed below for reference.   Imaging Studies: CT Angio Chest Pulmonary Embolism (PE) W or WO Contrast  Result Date: 09/08/2022 CLINICAL DATA:  Concern for pulmonary embolism. EXAM: CT ANGIOGRAPHY CHEST WITH CONTRAST TECHNIQUE: Multidetector CT imaging of the chest was performed using the standard protocol during bolus administration of intravenous contrast. Multiplanar CT image reconstructions and MIPs were obtained to evaluate the vascular anatomy. RADIATION DOSE REDUCTION: This exam was performed according to the departmental dose-optimization program which includes automated exposure control, adjustment of the mA and/or kV according to patient size and/or use of iterative reconstruction technique. CONTRAST:  75mL OMNIPAQUE IOHEXOL 350 MG/ML SOLN COMPARISON:  PET-CT 08/28/2021, CT chest 02/18/2022 FINDINGS: Cardiovascular: No filling defects within the pulmonary arteries to suggest acute pulmonary embolism. Is Mediastinum/Nodes: No axillary or supraclavicular adenopathy. No mediastinal or hilar adenopathy. No pericardial fluid. Esophagus normal. Lungs/Pleura: Rounded airspace density in the medial RIGHT lower lobe posterior to the bronchus intermedius measures 3.2 by 2.2 cm (image 85/series 4) compared to 2.5 by 2.4 cm. Visually lesion looks very similar. Severe upper lobe centrilobular emphysema. Subpleural cystic change in the lower lobes similar prior. No new pulmonary nodules. Upper Abdomen: Limited view of the liver, kidneys, pancreas are unremarkable. Normal adrenal glands. Musculoskeletal: No aggressive osseous lesion. Review of the MIP  images confirms the above findings. IMPRESSION: 1. No evidence acute pulmonary embolism. 2. Severe upper lobe emphysema. 3. Masslike consolidation posterior to the RIGHT bronchus intermedius measures slightly larger. Consider repeat outpatient FDG PET scan. Electronically Signed   By: Genevive Bi M.D.   On: 09/08/2022 11:25   DG Chest Port 1 View  Result Date: 09/06/2022 CLINICAL DATA:  Shortness of breath EXAM: PORTABLE CHEST 1 VIEW COMPARISON:  06/06/2022 FINDINGS: Cardiac shadow is within normal limits. Aortic calcifications are noted. Lungs are well aerated bilaterally. Chronic emphysematous changes are seen. Chronic interstitial thickening is noted. Stable blunting of the right costophrenic angle is noted. IMPRESSION: Chronic changes without acute abnormality. Electronically Signed   By: Alcide Clever M.D.   On: 09/06/2022 18:39    Microbiology: Results for orders placed or performed during the hospital encounter of 06/06/22  Blood Culture (routine x 2)     Status: None   Collection Time: 06/06/22 11:34 AM   Specimen: BLOOD  Result Value Ref Range Status   Specimen Description BLOOD BLOOD LEFT ARM  Final   Special Requests   Final    BOTTLES DRAWN AEROBIC AND ANAEROBIC Blood Culture results may not be optimal due to an inadequate volume of blood received in culture bottles   Culture   Final    NO GROWTH 5 DAYS Performed at [facility], [address] [place], [place] [address]    Report Status 06/11/2022 FINAL  Final  Blood Culture (routine x 2)     Status: None   Collection Time: 06/06/22 11:51 AM   Specimen: BLOOD  Result Value Ref Range Status   Specimen Description BLOOD LEFT ANTECUBITAL  Final   Special Requests   Final    BOTTLES DRAWN AEROBIC AND ANAEROBIC Blood Culture adequate volume   Culture   Final    NO GROWTH 5 DAYS Performed at [facility], [address] [place], [place] [address]    Report Status 06/11/2022 FINAL  Final  SARS  Coronavirus 2 by RT PCR (hospital order, performed in [facility] hospital lab) *cepheid single result test* Anterior Nasal Swab     Status: None   Collection Time: 06/06/22 11:52 AM   Specimen: Anterior Nasal Swab  Result Value Ref Range Status   SARS Coronavirus 2 by RT PCR NEGATIVE NEGATIVE Final    Comment: (NOTE) SARS-CoV-2 target nucleic acids are NOT DETECTED.  The SARS-CoV-2 RNA is generally detectable in upper and lower respiratory specimens during the acute phase of infection. The lowest concentration of SARS-CoV-2 viral copies this assay can detect is 250 copies / mL. A negative result does not preclude SARS-CoV-2 infection and should not be used as the sole basis for treatment or other patient management decisions.  A negative result may occur with improper specimen collection / handling, submission of specimen other than nasopharyngeal swab, presence of viral mutation(s) within the areas targeted by this assay, and inadequate number of viral copies (<250 copies / mL). A negative result must be combined with clinical observations, patient history, and epidemiological information.  Fact Sheet for Patients:   RoadLapTop.co.za  Fact Sheet for Healthcare Providers: http://kim-miller.com/  This test is not yet approved or  cleared by the [place] FDA and has been authorized for detection and/or diagnosis of SARS-CoV-2 by FDA under an Emergency Use Authorization (EUA).  This EUA will remain in effect (meaning this test can be used) for the duration of the COVID-19 declaration under Section 564(b)(1) of the Act, 21 U.S.C. section 360bbb-3(b)(1), unless the authorization is terminated or revoked sooner.  Performed at [facility], [address] [place], [place] [address]   Expectorated Sputum Assessment w Gram Stain, Rflx to Resp Cult     Status: None   Collection Time: 06/06/22  3:01 PM   Specimen: Sputum   Result Value Ref Range Status   Specimen Description SPUTUM  Final   Special Requests NONE  Final   Sputum evaluation   Final    THIS SPECIMEN IS ACCEPTABLE FOR SPUTUM CULTURE Performed at [facility], [address], [place], [place] [address]    Report Status 06/06/2022 FINAL  Final  Culture, Respiratory w Gram Stain     Status: None   Collection Time: 06/06/22  3:01 PM   Specimen: SPU  Result Value Ref Range Status  Specimen Description   Final    SPUTUM Performed at [facility], [address] [place], [place] [address]    Special Requests   Final    NONE Reflexed from [id number] Performed at [facility], [address] [place], [place] [address]    Gram Stain   Final    MODERATE WBC PRESENT, PREDOMINANTLY PMN FEW GRAM NEGATIVE COCCI RARE GRAM POSITIVE COCCI    Culture   Final    MODERATE PSEUDOMONAS AERUGINOSA SEE SEPARATE REPPORT Performed at [facility], [address], [place], [place] [address]    Report Status 06/18/2022 FINAL  Final  Susceptibility, Aer + Anaerob     Status: Abnormal   Collection Time: 06/06/22  4:00 PM  Result Value Ref Range Status   Suscept, Aer + Anaerob Final report (A)  Corrected    Comment: (NOTE) Performed At: [facility] [address] [place], [place] [id number] Jolene Schimke MD UX:[id number] CORRECTED ON 04/14 AT 0736: PREVIOUSLY REPORTED AS Preliminary report    Source of Sample [id number] PAER SENSI SPUTUM  Final    Comment: Performed at [facility], [address] [place], [place] [address]  Susceptibility Result     Status: Abnormal   Collection Time: 06/06/22  4:00 PM  Result Value Ref Range Status   Suscept Result 1 Comment (A)  Final    Comment: (NOTE) Pseudomonas aeruginosa Identification performed by account, not confirmed by this laboratory.    Suscept Result 2 Not applicable  Corrected    Comment: CORRECTED ON 04/14 AT 2595: PREVIOUSLY REPORTED AS Comment    Antimicrobial Suscept Comment  Corrected    Comment: (NOTE)      ** S = Susceptible; I = Intermediate; R = Resistant **                   P = Positive; N = Negative            MICS are expressed in micrograms per mL   Antibiotic                 RSLT#1    RSLT#2    RSLT#3    RSLT#4 Amikacin                       S Cefepime                       R Ceftazidime                    I Imipenem                       S Levofloxacin                   S Meropenem                      S Tobramycin                     I Performed At: [facility] [employer] [address] [place], [place] [id number] Jolene Schimke MD IR:[id number]   Respiratory (~20 pathogens) panel by PCR     Status: None   Collection Time: 06/07/22  9:06 AM   Specimen: Nasopharyngeal Swab; Respiratory  Result Value Ref Range Status   Adenovirus NOT DETECTED NOT DETECTED Final   Coronavirus 229E NOT DETECTED  NOT DETECTED Final    Comment: (NOTE) The Coronavirus on the Respiratory Panel, DOES NOT test for the novel  Coronavirus (2019 nCoV)    Coronavirus HKU1 NOT DETECTED NOT DETECTED Final   Coronavirus NL63 NOT DETECTED NOT DETECTED Final   Coronavirus OC43 NOT DETECTED NOT DETECTED Final   Metapneumovirus NOT DETECTED NOT DETECTED Final   Rhinovirus / Enterovirus NOT DETECTED NOT DETECTED Final   Influenza A NOT DETECTED NOT DETECTED Final   Influenza B NOT DETECTED NOT DETECTED Final   Parainfluenza Virus 1 NOT DETECTED NOT DETECTED Final   Parainfluenza Virus 2 NOT DETECTED NOT DETECTED Final   Parainfluenza Virus 3 NOT DETECTED NOT DETECTED Final   Parainfluenza Virus 4 NOT DETECTED NOT DETECTED Final   Respiratory Syncytial Virus NOT DETECTED NOT DETECTED Final   Bordetella pertussis NOT DETECTED NOT DETECTED Final   Bordetella Parapertussis NOT DETECTED NOT DETECTED Final   Chlamydophila pneumoniae NOT DETECTED NOT DETECTED Final   Mycoplasma pneumoniae NOT DETECTED NOT DETECTED Final    Comment: Performed at [facility], [address] [place], [place] [address]    Labs: CBC: Recent Labs  Lab 09/06/22 1840 09/09/22 0447  WBC 14.9* 14.1*  NEUTROABS 12.0*  --   HGB 14.7 13.2  HCT 45.8 40.7  MCV 88.2 87.2  PLT 274 221   Basic Metabolic Panel: Recent Labs  Lab 09/06/22 1840 09/09/22 0447 09/13/22 0518  NA 137 141  --   K 4.1 3.6  --   CL 103 106  --   CO2 20* 26  --   GLUCOSE 205* 116*  --   BUN 25* 31*  --   CREATININE 1.40* 1.11 0.96  CALCIUM 9.2 8.5*  --    Liver Function Tests: Recent Labs  Lab 09/06/22 1840  AST 25  ALT 29  ALKPHOS 78  BILITOT 0.6  PROT 6.8  ALBUMIN 3.7   CBG: Recent Labs  Lab 09/12/22 1155 09/12/22 1655 09/12/22 2023 09/13/22 0820 09/13/22 1211  GLUCAP 284* 278* 206* 229* 183*    Discharge time spent: greater than 30 minutes.  Signed: Marcelino Duster, MD [facility] 09/13/2022

## 2022-09-13 NOTE — Progress Notes (Signed)
[facility])    If applicable, please send signed and completed DNR with patient/family upon discharge. Please provide prescriptions at discharge as needed to ensure ongoing symptom management and a transport packet.   [facility] information and contact numbers given to family and above information shared with TOC. TOC/Meagan to arrange transport   Please call with any questions/concerns.    Thank you for the opportunity to participate in this patient's care   Eugenie Birks, MSW [facility] Liaison  [phone]

## 2022-09-13 NOTE — Care Management Important Message (Signed)
Important Message  Patient Details  Name: Bradley [name] MRN: [id number] Date of Birth: 1950/10/04   Medicare Important Message Given:  Other (see comment)  Disposition to discharge with hospice services.  Medicare IM withheld at this time out of respect for patient and family.   Johnell Comings 09/13/2022, 8:42 AM

## 2022-09-13 NOTE — Progress Notes (Signed)
Bradley [name]  A and O x 4. VSS. Pt tolerating diet well. No complaints of pain or nausea. IV removed intact, prescriptions given. Pt voiced understanding of discharge instructions with no further questions. Pt discharged via wheelchair with EMS.     Allergies as of 09/13/2022       Reactions   Ace Inhibitors Other (See Comments)   Hypotension        Medication List     STOP taking these medications    predniSONE 10 MG (21) Tbpk tablet Commonly known as: STERAPRED UNI-PAK 21 TAB Replaced by: predniSONE 10 MG tablet You also have another medication with the same name that you need to continue taking as instructed.       TAKE these medications    albuterol 108 (90 Base) MCG/ACT inhaler Commonly known as: VENTOLIN HFA Inhale 2 puffs into the lungs every 6 (six) hours as needed for wheezing.   aspirin EC 81 MG tablet Take 81 mg by mouth every evening.   famotidine 20 MG tablet Commonly known as: PEPCID Take 20 mg by mouth daily.   fluticasone 50 MCG/ACT nasal spray Commonly known as: FLONASE Place 1 spray into both nostrils daily as needed for allergies or rhinitis.   folic acid 1 MG tablet Commonly known as: FOLVITE Take 1 tablet (1 mg total) by mouth daily.   furosemide 20 MG tablet Commonly known as: LASIX Take 20 mg by mouth as needed for edema.   ipratropium-albuterol 0.5-2.5 (3) MG/3ML Soln Commonly known as: DUONEB Take 3 mLs by nebulization every 4 (four) hours as needed.   linagliptin 5 MG Tabs tablet Commonly known as: TRADJENTA Take 5 mg by mouth every morning.   LORazepam 0.5 MG tablet Commonly known as: ATIVAN Take 0.5 mg by mouth every 6 (six) hours as needed.   metFORMIN 750 MG 24 hr tablet Commonly known as: GLUCOPHAGE-XR Take 750 mg by mouth 2 (two) times daily.   metoprolol tartrate 25 MG tablet Commonly known as: LOPRESSOR Take 1 tablet (25 mg total) by mouth 2 (two) times daily. What changed:  medication strength how much to take    montelukast 10 MG tablet Commonly known as: SINGULAIR Take 10 mg by mouth at bedtime.   multivitamin with minerals Tabs tablet Take 1 tablet by mouth daily.   predniSONE 10 MG tablet Commonly known as: DELTASONE Take 10 mg by mouth daily. What changed:  Another medication with the same name was added. Make sure you understand how and when to take each. Another medication with the same name was removed. Continue taking this medication, and follow the directions you see here.   predniSONE 10 MG tablet Commonly known as: DELTASONE Take 6 tablets (60 mg total) by mouth daily for 3 days, THEN 5 tablets (50 mg total) daily for 3 days, THEN 4 tablets (40 mg total) daily for 3 days, THEN 3 tablets (30 mg total) daily for 3 days, THEN 2 tablets (20 mg total) daily for 3 days, THEN 1 tablet (10 mg total) daily for 3 days. Continue prednisone 10 mg after finishing taper. Start taking on: September 13, 2022 What changed: You were already taking a medication with the same name, and this prescription was added. Make sure you understand how and when to take each. Replaces: predniSONE 10 MG (21) Tbpk tablet   rosuvastatin 40 MG tablet Commonly known as: CRESTOR Take 40 mg by mouth every morning.   thiamine 100 MG tablet Commonly known as: Vitamin B-1  Take 1 tablet (100 mg total) by mouth daily.   Trelegy Ellipta 100-62.5-25 MCG/ACT Aepb Generic drug: Fluticasone-Umeclidin-Vilant Inhale 1 puff into the lungs daily.        Vitals:   09/13/22 1352 09/13/22 1625  BP:  103/60  Pulse:  82  Resp:  20  Temp:    SpO2: 93% 98%    Bradley [name]

## 2022-09-13 NOTE — Inpatient Diabetes Management (Signed)
Inpatient Diabetes Program Recommendations  AACE/ADA: New Consensus Statement on Inpatient Glycemic Control   Target Ranges:  Prepandial:   less than 140 mg/dL      Peak postprandial:   less than 180 mg/dL (1-2 hours)      Critically ill patients:  140 - 180 mg/dL    Latest Reference Range & Units 09/12/22 08:32 09/12/22 11:55 09/12/22 16:55 09/12/22 20:23 09/13/22 08:20  Glucose-Capillary 70 - 99 mg/dL 433 (H) 295 (H) 188 (H) 206 (H) 229 (H)   Review of Glycemic Control  Diabetes history: DM2 Outpatient Diabetes medications: Metformin 750 mg BID, Tradjenta 5 mg daily Current orders for Inpatient glycemic control: Semglee 15 units at bedtime, Novolog 0-20 units TID with meals, Novolog 0-5 units at bedtime, Tradjenta 5 mg daily; Solumedrol 80 mg daily   Inpatient Diabetes Program Recommendations:     Insulin: If steroids are continued, please consider ordering Novolog 4 units TID with meals for meal coverage if patient eats at least 50% of meals.  Thanks, Orlando Penner, RN, MSN, CDCES Diabetes Coordinator Inpatient Diabetes Program [phone] (Team Pager from 8am to 5pm)

## 2022-11-03 DEATH — deceased
